# Patient Record
Sex: Male | Born: 1935 | Race: White | Hispanic: No | Marital: Married | Smoking: Never smoker
Health system: Southern US, Community
[De-identification: ages and names within clinical notes are randomized; demographics above are authoritative.]

## PROBLEM LIST (undated history)

## (undated) DIAGNOSIS — D509 Iron deficiency anemia, unspecified: Secondary | ICD-10-CM

## (undated) DIAGNOSIS — K279 Peptic ulcer, site unspecified, unspecified as acute or chronic, without hemorrhage or perforation: Secondary | ICD-10-CM

## (undated) DIAGNOSIS — E785 Hyperlipidemia, unspecified: Secondary | ICD-10-CM

## (undated) DIAGNOSIS — K5792 Diverticulitis of intestine, part unspecified, without perforation or abscess without bleeding: Secondary | ICD-10-CM

## (undated) DIAGNOSIS — I10 Essential (primary) hypertension: Secondary | ICD-10-CM

## (undated) DIAGNOSIS — Z8349 Family history of other endocrine, nutritional and metabolic diseases: Secondary | ICD-10-CM

## (undated) DIAGNOSIS — D4959 Neoplasm of unspecified behavior of other genitourinary organ: Secondary | ICD-10-CM

## (undated) DIAGNOSIS — Z860100 Personal history of colon polyps, unspecified: Secondary | ICD-10-CM

## (undated) DIAGNOSIS — E669 Obesity, unspecified: Secondary | ICD-10-CM

## (undated) DIAGNOSIS — K224 Dyskinesia of esophagus: Secondary | ICD-10-CM

## (undated) DIAGNOSIS — K449 Diaphragmatic hernia without obstruction or gangrene: Secondary | ICD-10-CM

## (undated) DIAGNOSIS — A048 Other specified bacterial intestinal infections: Secondary | ICD-10-CM

## (undated) DIAGNOSIS — I4891 Unspecified atrial fibrillation: Secondary | ICD-10-CM

## (undated) DIAGNOSIS — Z9289 Personal history of other medical treatment: Secondary | ICD-10-CM

## (undated) DIAGNOSIS — R002 Palpitations: Secondary | ICD-10-CM

## (undated) DIAGNOSIS — K2289 Other specified disease of esophagus: Secondary | ICD-10-CM

## (undated) DIAGNOSIS — Z79899 Other long term (current) drug therapy: Secondary | ICD-10-CM

## (undated) DIAGNOSIS — K222 Esophageal obstruction: Secondary | ICD-10-CM

## (undated) DIAGNOSIS — Z800 Family history of malignant neoplasm of digestive organs: Secondary | ICD-10-CM

## (undated) HISTORY — DX: Diverticulitis of intestine, part unspecified, without perforation or abscess without bleeding: K57.92

## (undated) HISTORY — DX: Unspecified atrial fibrillation: I48.91

## (undated) HISTORY — DX: Other specified disease of esophagus: K22.89

## (undated) HISTORY — DX: Esophageal obstruction: K22.2

## (undated) HISTORY — DX: Family history of other endocrine, nutritional and metabolic diseases: Z83.49

## (undated) HISTORY — DX: Other long term (current) drug therapy: Z79.899

## (undated) HISTORY — DX: Palpitations: R00.2

## (undated) HISTORY — DX: Neoplasm of unspecified behavior of other genitourinary organ: D49.59

## (undated) HISTORY — DX: Iron deficiency anemia, unspecified: D50.9

## (undated) HISTORY — PX: OTHER SURGICAL HISTORY: SHX169

## (undated) HISTORY — DX: Obesity, unspecified: E66.9

## (undated) HISTORY — DX: Personal history of colon polyps, unspecified: Z86.0100

## (undated) HISTORY — DX: Essential (primary) hypertension: I10

## (undated) HISTORY — DX: Personal history of other medical treatment: Z92.89

## (undated) HISTORY — DX: Peptic ulcer, site unspecified, unspecified as acute or chronic, without hemorrhage or perforation: K27.9

## (undated) HISTORY — DX: Dyskinesia of esophagus: K22.4

## (undated) HISTORY — DX: Other specified bacterial intestinal infections: A04.8

## (undated) HISTORY — DX: Hyperlipidemia, unspecified: E78.5

## (undated) HISTORY — PX: KNEE ARTHROSCOPY: SUR90

## (undated) HISTORY — DX: Personal history of colonic polyps: Z86.010

## (undated) HISTORY — PX: TONSILLECTOMY: SUR1361

## (undated) HISTORY — DX: Diaphragmatic hernia without obstruction or gangrene: K44.9

## (undated) HISTORY — DX: Family history of malignant neoplasm of digestive organs: Z80.0

---

## 1989-10-02 HISTORY — PX: OTHER SURGICAL HISTORY: SHX169

## 1994-10-02 HISTORY — PX: CARDIAC VALVE SURGERY: SHX40

## 1998-03-10 ENCOUNTER — Ambulatory Visit (HOSPITAL_BASED_OUTPATIENT_CLINIC_OR_DEPARTMENT_OTHER): Admission: RE | Admit: 1998-03-10 | Discharge: 1998-03-10 | Payer: Self-pay | Admitting: *Deleted

## 2000-05-10 ENCOUNTER — Encounter: Payer: Self-pay | Admitting: Internal Medicine

## 2000-05-10 ENCOUNTER — Ambulatory Visit (HOSPITAL_COMMUNITY): Admission: RE | Admit: 2000-05-10 | Discharge: 2000-05-10 | Payer: Self-pay | Admitting: *Deleted

## 2000-05-30 ENCOUNTER — Ambulatory Visit (HOSPITAL_COMMUNITY): Admission: RE | Admit: 2000-05-30 | Discharge: 2000-05-30 | Payer: Self-pay | Admitting: Urology

## 2000-05-30 ENCOUNTER — Encounter: Payer: Self-pay | Admitting: Urology

## 2002-10-02 HISTORY — PX: CATARACT EXTRACTION: SUR2

## 2004-08-22 ENCOUNTER — Ambulatory Visit: Payer: Self-pay | Admitting: Gastroenterology

## 2004-09-01 HISTORY — PX: COLONOSCOPY: SHX174

## 2004-09-05 ENCOUNTER — Ambulatory Visit: Payer: Self-pay | Admitting: Gastroenterology

## 2005-02-08 ENCOUNTER — Ambulatory Visit: Payer: Self-pay | Admitting: Internal Medicine

## 2005-06-06 ENCOUNTER — Ambulatory Visit: Payer: Self-pay | Admitting: Internal Medicine

## 2005-06-22 ENCOUNTER — Ambulatory Visit: Payer: Self-pay | Admitting: Internal Medicine

## 2005-10-02 LAB — HM COLONOSCOPY

## 2006-08-16 ENCOUNTER — Ambulatory Visit: Payer: Self-pay | Admitting: Internal Medicine

## 2006-09-06 ENCOUNTER — Ambulatory Visit: Payer: Self-pay | Admitting: Internal Medicine

## 2006-09-06 LAB — CONVERTED CEMR LAB
ALT: 21 units/L (ref 0–40)
AST: 21 units/L (ref 0–37)
Albumin: 4.1 g/dL (ref 3.5–5.2)
Alkaline Phosphatase: 80 units/L (ref 39–117)
BUN: 16 mg/dL (ref 6–23)
Bilirubin, Direct: 0.2 mg/dL (ref 0.0–0.3)
CO2: 28 meq/L (ref 19–32)
Calcium: 9.2 mg/dL (ref 8.4–10.5)
Chloride: 102 meq/L (ref 96–112)
Chol/HDL Ratio, serum: 5.3
Cholesterol: 191 mg/dL (ref 0–200)
Creatinine, Ser: 1.1 mg/dL (ref 0.4–1.5)
GFR calc non Af Amer: 69 mL/min
Glomerular Filtration Rate, Af Am: 84 mL/min/{1.73_m2}
Glucose, Bld: 87 mg/dL (ref 70–99)
HCT: 49 % (ref 39.0–52.0)
HDL: 35.9 mg/dL — ABNORMAL LOW (ref >39.0)
Hemoglobin: 16.5 g/dL (ref 13.0–17.0)
LDL Cholesterol: 125 mg/dL — ABNORMAL HIGH (ref 0–99)
MCHC: 33.8 g/dL (ref 30.0–36.0)
MCV: 93.4 fL (ref 78.0–100.0)
PSA: 1.22 ng/mL (ref 0.10–4.00)
Platelets: 211 10*3/uL (ref 150–400)
Potassium: 3.9 meq/L (ref 3.5–5.1)
RBC: 5.25 M/uL (ref 4.22–5.81)
RDW: 12.5 % (ref 11.5–14.6)
Sodium: 137 meq/L (ref 135–145)
Total Bilirubin: 1 mg/dL (ref 0.3–1.2)
Total Protein: 6.8 g/dL (ref 6.0–8.3)
Triglyceride fasting, serum: 150 mg/dL — ABNORMAL HIGH (ref 0–149)
VLDL: 30 mg/dL (ref 0–40)
WBC: 7 10*3/uL (ref 4.5–10.5)

## 2007-07-31 DIAGNOSIS — Z86010 Personal history of colonic polyps: Secondary | ICD-10-CM | POA: Insufficient documentation

## 2007-07-31 DIAGNOSIS — Z8719 Personal history of other diseases of the digestive system: Secondary | ICD-10-CM | POA: Insufficient documentation

## 2007-07-31 DIAGNOSIS — E785 Hyperlipidemia, unspecified: Secondary | ICD-10-CM | POA: Insufficient documentation

## 2008-03-13 ENCOUNTER — Ambulatory Visit: Payer: Self-pay | Admitting: Internal Medicine

## 2008-03-13 DIAGNOSIS — I482 Chronic atrial fibrillation, unspecified: Secondary | ICD-10-CM | POA: Insufficient documentation

## 2008-03-13 DIAGNOSIS — I4891 Unspecified atrial fibrillation: Secondary | ICD-10-CM | POA: Insufficient documentation

## 2008-03-13 HISTORY — DX: Chronic atrial fibrillation, unspecified: I48.20

## 2008-03-16 ENCOUNTER — Ambulatory Visit: Payer: Self-pay | Admitting: Internal Medicine

## 2008-03-20 ENCOUNTER — Ambulatory Visit: Payer: Self-pay | Admitting: Internal Medicine

## 2008-03-20 LAB — CONVERTED CEMR LAB
INR: 2.2
Prothrombin Time: 18 s

## 2008-04-02 ENCOUNTER — Ambulatory Visit: Payer: Self-pay | Admitting: Internal Medicine

## 2008-04-02 LAB — CONVERTED CEMR LAB
INR: 4.3
Prothrombin Time: 25 s

## 2008-04-16 ENCOUNTER — Ambulatory Visit: Payer: Self-pay | Admitting: Internal Medicine

## 2008-04-16 LAB — CONVERTED CEMR LAB
INR: 2.3
Prothrombin Time: 18.4 s

## 2008-05-04 ENCOUNTER — Ambulatory Visit: Payer: Self-pay | Admitting: Internal Medicine

## 2008-05-08 ENCOUNTER — Ambulatory Visit: Payer: Self-pay | Admitting: Cardiology

## 2008-05-18 ENCOUNTER — Ambulatory Visit: Payer: Self-pay | Admitting: Internal Medicine

## 2008-05-18 LAB — CONVERTED CEMR LAB
INR: 2.3
Prothrombin Time: 18.5 s

## 2008-05-26 ENCOUNTER — Ambulatory Visit: Payer: Self-pay | Admitting: Cardiology

## 2008-06-15 ENCOUNTER — Ambulatory Visit: Payer: Self-pay | Admitting: Internal Medicine

## 2008-06-15 LAB — CONVERTED CEMR LAB
INR: 2.1
Prothrombin Time: 17.9 s

## 2008-07-20 ENCOUNTER — Ambulatory Visit: Payer: Self-pay | Admitting: Internal Medicine

## 2008-07-20 LAB — CONVERTED CEMR LAB
INR: 2.9
Prothrombin Time: 20.5 s

## 2008-08-05 ENCOUNTER — Ambulatory Visit: Payer: Self-pay | Admitting: Internal Medicine

## 2008-08-20 ENCOUNTER — Ambulatory Visit: Payer: Self-pay

## 2008-08-20 ENCOUNTER — Encounter: Payer: Self-pay | Admitting: Cardiology

## 2008-08-20 ENCOUNTER — Ambulatory Visit: Payer: Self-pay | Admitting: Cardiology

## 2008-08-21 ENCOUNTER — Ambulatory Visit: Payer: Self-pay | Admitting: Internal Medicine

## 2008-08-24 LAB — CONVERTED CEMR LAB
INR: 2.8
Prothrombin Time: 20.2 s

## 2008-09-18 ENCOUNTER — Ambulatory Visit: Payer: Self-pay | Admitting: Internal Medicine

## 2008-09-18 LAB — CONVERTED CEMR LAB
INR: 2.2
Prothrombin Time: 18.1 s

## 2008-10-19 ENCOUNTER — Ambulatory Visit: Payer: Self-pay | Admitting: Internal Medicine

## 2008-10-19 LAB — CONVERTED CEMR LAB
INR: 1.7
Prothrombin Time: 16.3 s

## 2008-11-16 ENCOUNTER — Ambulatory Visit: Payer: Self-pay | Admitting: Internal Medicine

## 2008-11-16 LAB — CONVERTED CEMR LAB
INR: 2.7
Prothrombin Time: 19.9 s

## 2008-12-03 ENCOUNTER — Ambulatory Visit: Payer: Self-pay | Admitting: Internal Medicine

## 2008-12-16 ENCOUNTER — Ambulatory Visit: Payer: Self-pay | Admitting: Internal Medicine

## 2008-12-16 LAB — CONVERTED CEMR LAB
INR: 2.1
Prothrombin Time: 18 s

## 2009-01-15 ENCOUNTER — Ambulatory Visit: Payer: Self-pay | Admitting: Internal Medicine

## 2009-01-15 LAB — CONVERTED CEMR LAB
INR: 2.2
Prothrombin Time: 18.3 s

## 2009-01-20 DIAGNOSIS — E669 Obesity, unspecified: Secondary | ICD-10-CM | POA: Insufficient documentation

## 2009-01-20 DIAGNOSIS — R002 Palpitations: Secondary | ICD-10-CM | POA: Insufficient documentation

## 2009-01-20 DIAGNOSIS — I10 Essential (primary) hypertension: Secondary | ICD-10-CM | POA: Insufficient documentation

## 2009-01-22 ENCOUNTER — Encounter: Payer: Self-pay | Admitting: Cardiology

## 2009-01-22 ENCOUNTER — Ambulatory Visit: Payer: Self-pay | Admitting: Cardiology

## 2009-02-15 ENCOUNTER — Ambulatory Visit: Payer: Self-pay | Admitting: Internal Medicine

## 2009-02-15 LAB — CONVERTED CEMR LAB
INR: 2.1
Prothrombin Time: 18 s

## 2009-03-17 ENCOUNTER — Ambulatory Visit: Payer: Self-pay | Admitting: Internal Medicine

## 2009-03-17 LAB — CONVERTED CEMR LAB
ALT: 16 units/L (ref 0–53)
AST: 18 units/L (ref 0–37)
Albumin: 4.2 g/dL (ref 3.5–5.2)
Alkaline Phosphatase: 87 units/L (ref 39–117)
BUN: 19 mg/dL (ref 6–23)
Basophils Absolute: 0 10*3/uL (ref 0.0–0.1)
Basophils Relative: 0.2 % (ref 0.0–3.0)
Bilirubin, Direct: 0.1 mg/dL (ref 0.0–0.3)
CO2: 27 meq/L (ref 19–32)
Calcium: 8.8 mg/dL (ref 8.4–10.5)
Chloride: 102 meq/L (ref 96–112)
Cholesterol: 169 mg/dL (ref 0–200)
Creatinine, Ser: 1.1 mg/dL (ref 0.4–1.5)
Eosinophils Absolute: 0.1 10*3/uL (ref 0.0–0.7)
Eosinophils Relative: 0.8 % (ref 0.0–5.0)
GFR calc non Af Amer: 68.62 mL/min (ref >60)
Glucose, Bld: 99 mg/dL (ref 70–99)
HCT: 50.6 % (ref 39.0–52.0)
HDL: 35.8 mg/dL — ABNORMAL LOW (ref >39.00)
Hemoglobin: 17.2 g/dL — ABNORMAL HIGH (ref 13.0–17.0)
INR: 2.6
LDL Cholesterol: 113 mg/dL — ABNORMAL HIGH (ref 0–99)
Lymphocytes Relative: 26 % (ref 12.0–46.0)
Lymphs Abs: 1.6 10*3/uL (ref 0.7–4.0)
MCHC: 34 g/dL (ref 30.0–36.0)
MCV: 94.7 fL (ref 78.0–100.0)
Monocytes Absolute: 0.4 10*3/uL (ref 0.1–1.0)
Monocytes Relative: 6.8 % (ref 3.0–12.0)
Neutro Abs: 4.2 10*3/uL (ref 1.4–7.7)
Neutrophils Relative %: 66.2 % (ref 43.0–77.0)
Platelets: 164 10*3/uL (ref 150.0–400.0)
Potassium: 4.4 meq/L (ref 3.5–5.1)
Prothrombin Time: 19.7 s
RBC: 5.34 M/uL (ref 4.22–5.81)
RDW: 12.3 % (ref 11.5–14.6)
Sodium: 141 meq/L (ref 135–145)
TSH: 2.06 microintl units/mL (ref 0.35–5.50)
Total Bilirubin: 1.1 mg/dL (ref 0.3–1.2)
Total CHOL/HDL Ratio: 5
Total Protein: 7.2 g/dL (ref 6.0–8.3)
Triglycerides: 101 mg/dL (ref 0.0–149.0)
VLDL: 20.2 mg/dL (ref 0.0–40.0)
WBC: 6.3 10*3/uL (ref 4.5–10.5)

## 2009-04-16 ENCOUNTER — Ambulatory Visit: Payer: Self-pay | Admitting: Internal Medicine

## 2009-04-16 LAB — CONVERTED CEMR LAB
INR: 2.4
Prothrombin Time: 18.8 s

## 2009-05-14 ENCOUNTER — Ambulatory Visit: Payer: Self-pay | Admitting: Internal Medicine

## 2009-05-14 LAB — CONVERTED CEMR LAB
INR: 2.6
Prothrombin Time: 19.5 s

## 2009-06-15 ENCOUNTER — Ambulatory Visit: Payer: Self-pay | Admitting: Internal Medicine

## 2009-06-15 LAB — CONVERTED CEMR LAB
INR: 1.6
Prothrombin Time: 15.8 s

## 2009-07-16 ENCOUNTER — Ambulatory Visit: Payer: Self-pay | Admitting: Internal Medicine

## 2009-07-16 LAB — CONVERTED CEMR LAB
INR: 2
Prothrombin Time: 17.3 s

## 2009-08-02 ENCOUNTER — Ambulatory Visit: Payer: Self-pay | Admitting: Cardiology

## 2009-08-11 ENCOUNTER — Ambulatory Visit: Payer: Self-pay | Admitting: Internal Medicine

## 2009-08-16 ENCOUNTER — Ambulatory Visit: Payer: Self-pay | Admitting: Internal Medicine

## 2009-08-16 LAB — CONVERTED CEMR LAB
INR: 3.4
Prothrombin Time: 22.3 s

## 2009-09-13 ENCOUNTER — Ambulatory Visit: Payer: Self-pay | Admitting: Internal Medicine

## 2009-09-13 LAB — CONVERTED CEMR LAB
INR: 2.5
Prothrombin Time: 19.2 s

## 2009-10-14 ENCOUNTER — Ambulatory Visit: Payer: Self-pay | Admitting: Internal Medicine

## 2009-10-14 LAB — CONVERTED CEMR LAB
INR: 2.3
Prothrombin Time: 18.4 s

## 2009-11-12 ENCOUNTER — Ambulatory Visit: Payer: Self-pay | Admitting: Internal Medicine

## 2009-11-12 LAB — CONVERTED CEMR LAB
INR: 1.7
Prothrombin Time: 15.9 s

## 2009-12-10 ENCOUNTER — Ambulatory Visit: Payer: Self-pay | Admitting: Internal Medicine

## 2009-12-10 LAB — CONVERTED CEMR LAB
INR: 2.3
Prothrombin Time: 18.4 s

## 2010-01-07 ENCOUNTER — Ambulatory Visit: Payer: Self-pay | Admitting: Internal Medicine

## 2010-01-07 LAB — CONVERTED CEMR LAB
INR: 2.5
Prothrombin Time: 19.2 s

## 2010-02-01 ENCOUNTER — Ambulatory Visit: Payer: Self-pay | Admitting: Cardiology

## 2010-02-07 ENCOUNTER — Ambulatory Visit: Payer: Self-pay | Admitting: Internal Medicine

## 2010-02-07 LAB — CONVERTED CEMR LAB
INR: 2.5
Prothrombin Time: 19.3 s

## 2010-02-24 ENCOUNTER — Ambulatory Visit: Payer: Self-pay

## 2010-02-24 ENCOUNTER — Encounter: Payer: Self-pay | Admitting: Cardiology

## 2010-02-24 ENCOUNTER — Ambulatory Visit: Payer: Self-pay | Admitting: Internal Medicine

## 2010-02-24 ENCOUNTER — Ambulatory Visit (HOSPITAL_COMMUNITY): Admission: RE | Admit: 2010-02-24 | Discharge: 2010-02-24 | Payer: Self-pay | Admitting: Internal Medicine

## 2010-02-25 ENCOUNTER — Telehealth: Payer: Self-pay | Admitting: Cardiology

## 2010-03-10 ENCOUNTER — Ambulatory Visit: Payer: Self-pay | Admitting: Internal Medicine

## 2010-03-10 LAB — CONVERTED CEMR LAB: INR: 2.4

## 2010-04-11 ENCOUNTER — Ambulatory Visit: Payer: Self-pay | Admitting: Internal Medicine

## 2010-04-11 LAB — CONVERTED CEMR LAB
ALT: 17 units/L (ref 0–53)
AST: 23 units/L (ref 0–37)
Albumin: 4 g/dL (ref 3.5–5.2)
Alkaline Phosphatase: 93 units/L (ref 39–117)
BUN: 19 mg/dL (ref 6–23)
Basophils Absolute: 0 10*3/uL (ref 0.0–0.1)
Basophils Relative: 0.5 % (ref 0.0–3.0)
Bilirubin, Direct: 0.2 mg/dL (ref 0.0–0.3)
CO2: 29 meq/L (ref 19–32)
Calcium: 8.8 mg/dL (ref 8.4–10.5)
Chloride: 104 meq/L (ref 96–112)
Cholesterol: 181 mg/dL (ref 0–200)
Creatinine, Ser: 1 mg/dL (ref 0.4–1.5)
Eosinophils Absolute: 0.1 10*3/uL (ref 0.0–0.7)
Eosinophils Relative: 0.9 % (ref 0.0–5.0)
GFR calc non Af Amer: 75.52 mL/min (ref >60)
Glucose, Bld: 87 mg/dL (ref 70–99)
HCT: 49.6 % (ref 39.0–52.0)
HDL: 38.2 mg/dL — ABNORMAL LOW (ref >39.00)
Hemoglobin: 17.1 g/dL — ABNORMAL HIGH (ref 13.0–17.0)
LDL Cholesterol: 113 mg/dL — ABNORMAL HIGH (ref 0–99)
Lymphocytes Relative: 24.3 % (ref 12.0–46.0)
Lymphs Abs: 1.9 10*3/uL (ref 0.7–4.0)
MCHC: 34.5 g/dL (ref 30.0–36.0)
MCV: 95.3 fL (ref 78.0–100.0)
Monocytes Absolute: 0.4 10*3/uL (ref 0.1–1.0)
Monocytes Relative: 5.2 % (ref 3.0–12.0)
Neutro Abs: 5.5 10*3/uL (ref 1.4–7.7)
Neutrophils Relative %: 69.1 % (ref 43.0–77.0)
Platelets: 193 10*3/uL (ref 150.0–400.0)
Potassium: 4.6 meq/L (ref 3.5–5.1)
RBC: 5.21 M/uL (ref 4.22–5.81)
RDW: 13.4 % (ref 11.5–14.6)
Sodium: 139 meq/L (ref 135–145)
TSH: 2.45 microintl units/mL (ref 0.35–5.50)
Total Bilirubin: 1 mg/dL (ref 0.3–1.2)
Total CHOL/HDL Ratio: 5
Total Protein: 6.5 g/dL (ref 6.0–8.3)
Triglycerides: 149 mg/dL (ref 0.0–149.0)
VLDL: 29.8 mg/dL (ref 0.0–40.0)
WBC: 8 10*3/uL (ref 4.5–10.5)

## 2010-05-05 ENCOUNTER — Ambulatory Visit: Payer: Self-pay | Admitting: Internal Medicine

## 2010-05-05 LAB — CONVERTED CEMR LAB: INR: 2.4

## 2010-05-11 ENCOUNTER — Telehealth: Payer: Self-pay | Admitting: Cardiology

## 2010-06-01 ENCOUNTER — Ambulatory Visit: Payer: Self-pay | Admitting: Internal Medicine

## 2010-06-01 LAB — CONVERTED CEMR LAB: INR: 2.3

## 2010-06-30 ENCOUNTER — Ambulatory Visit: Payer: Self-pay | Admitting: Internal Medicine

## 2010-06-30 LAB — CONVERTED CEMR LAB: INR: 1.9

## 2010-08-04 ENCOUNTER — Ambulatory Visit: Payer: Self-pay | Admitting: Internal Medicine

## 2010-08-04 LAB — CONVERTED CEMR LAB: INR: 1.8

## 2010-09-06 ENCOUNTER — Ambulatory Visit: Payer: Self-pay | Admitting: Internal Medicine

## 2010-09-06 LAB — CONVERTED CEMR LAB: INR: 2.4

## 2010-10-10 ENCOUNTER — Ambulatory Visit
Admission: RE | Admit: 2010-10-10 | Discharge: 2010-10-10 | Payer: Self-pay | Source: Home / Self Care | Attending: Internal Medicine | Admitting: Internal Medicine

## 2010-10-15 ENCOUNTER — Encounter: Payer: Self-pay | Admitting: Internal Medicine

## 2010-10-15 DIAGNOSIS — I4891 Unspecified atrial fibrillation: Secondary | ICD-10-CM

## 2010-10-30 LAB — CONVERTED CEMR LAB
ALT: 21 units/L (ref 0–53)
AST: 22 units/L (ref 0–37)
Albumin: 4.1 g/dL (ref 3.5–5.2)
Alkaline Phosphatase: 71 units/L (ref 39–117)
BUN: 21 mg/dL (ref 6–23)
Basophils Absolute: 0 10*3/uL (ref 0.0–0.1)
Basophils Relative: 0.2 % (ref 0.0–1.0)
Bilirubin, Direct: 0.1 mg/dL (ref 0.0–0.3)
CO2: 25 meq/L (ref 19–32)
Calcium: 9.1 mg/dL (ref 8.4–10.5)
Chloride: 106 meq/L (ref 96–112)
Cholesterol: 170 mg/dL (ref 0–200)
Creatinine, Ser: 1.1 mg/dL (ref 0.4–1.5)
Eosinophils Absolute: 0.1 10*3/uL (ref 0.0–0.7)
Eosinophils Relative: 1.3 % (ref 0.0–5.0)
GFR calc Af Amer: 83 mL/min
GFR calc non Af Amer: 69 mL/min
Glucose, Bld: 105 mg/dL — ABNORMAL HIGH (ref 70–99)
HCT: 47.9 % (ref 39.0–52.0)
HDL: 31 mg/dL — ABNORMAL LOW (ref >39.0)
Hemoglobin: 16.7 g/dL (ref 13.0–17.0)
LDL Cholesterol: 113 mg/dL — ABNORMAL HIGH (ref 0–99)
Lymphocytes Relative: 28.1 % (ref 12.0–46.0)
MCHC: 34.9 g/dL (ref 30.0–36.0)
MCV: 94.3 fL (ref 78.0–100.0)
Monocytes Absolute: 0.6 10*3/uL (ref 0.1–1.0)
Monocytes Relative: 8.1 % (ref 3.0–12.0)
Neutro Abs: 4.5 10*3/uL (ref 1.4–7.7)
Neutrophils Relative %: 62.3 % (ref 43.0–77.0)
Platelets: 196 10*3/uL (ref 150–400)
Potassium: 4.3 meq/L (ref 3.5–5.1)
RBC: 5.08 M/uL (ref 4.22–5.81)
RDW: 12.7 % (ref 11.5–14.6)
Sodium: 140 meq/L (ref 135–145)
TSH: 2.3 microintl units/mL (ref 0.35–5.50)
Total Bilirubin: 1.1 mg/dL (ref 0.3–1.2)
Total CHOL/HDL Ratio: 5.5
Total Protein: 6.9 g/dL (ref 6.0–8.3)
Triglycerides: 128 mg/dL (ref 0–149)
VLDL: 26 mg/dL (ref 0–40)
WBC: 7.3 10*3/uL (ref 4.5–10.5)

## 2010-11-01 NOTE — Assessment & Plan Note (Signed)
Summary: pt/njr  Nurse Visit   Allergies: 1)  ! Codeine Laboratory Results   Blood Tests   Date/Time Received: June 01, 2010 9:48 AM  Date/Time Reported: June 01, 2010 9:48 AM    INR: 2.3   (Normal Range: 0.88-1.12   Therap INR: 2.0-3.5) Comments: Wynona Canes, CMA  June 01, 2010 9:48 AM     Orders Added: 1)  Est. Patient Level I [99211] 2)  Protime [[id number]]  Laboratory Results   Blood Tests      INR: 2.3   (Normal Range: 0.88-1.12   Therap INR: 2.0-3.5) Comments: Wynona Canes, CMA  June 01, 2010 9:48 AM       ANTICOAGULATION RECORD PREVIOUS REGIMEN & LAB RESULTS Anticoagulation Diagnosis:  v58.83,v58.61,427.31 on  04/16/2008 Previous INR Goal Range:  2.0-3.0 on  04/16/2008 Previous INR:  2.4 on  05/05/2010 Previous Coumadin Dose(mg):  5mg  on m,w,f 7.5mg  other  on  08/24/2008 Previous Regimen:  same on  05/05/2010 Previous Coagulation Comments:  Patient stated he couldn't come for a month. I gave him a card saying three weeks and also verbaly told him Dr. Kirtland Bouchard said 3 weeks. on  06/15/2009  NEW REGIMEN & LAB RESULTS Current INR: 2.3 Regimen: same  (no change)       Repeat testing in: 4 weeks MEDICATIONS WARFARIN SODIUM 5 MG  TABS (WARFARIN SODIUM) one  tablet Monday, Wednesday, Friday and one, and one half tablets 4 times weekly DILTIAZEM HCL ER BEADS 360 MG XR24H-CAP (DILTIAZEM HCL ER BEADS) Take one capsule by mouth daily VIAGRA 50 MG TABS (SILDENAFIL CITRATE) one daily as directed   Anticoagulation Visit Questionnaire      Coumadin dose missed/changed:  No      Abnormal Bleeding Symptoms:  No   Any diet changes including alcohol intake, vegetables or greens since the last visit:  No Any illnesses or hospitalizations since the last visit:  No Any signs of clotting since the last visit (including chest discomfort, dizziness, shortness of breath, arm tingling, slurred speech, swelling or redness in leg):  No

## 2010-11-01 NOTE — Assessment & Plan Note (Signed)
Summary: 3 day rov/njr   Vital Signs:  Patient Profile:   77 Years Old Male Weight:      256 pounds Pulse rate:   100 / minute Pulse rhythm:   irregular BP sitting:   140 / 60  (left arm) Cuff size:   regular  Vitals Entered By: Raechel Ache, RN (March 16, 2008 1:50 PM)                 Chief Complaint:  F/u; c/o dull headache for few days.Marland Kitchen  History of Present Illness: 77 year old patient seen today for follow-up.  He was seen 3 days ago for an annual exam and was noted to have atrial fibrillation  of new onset.  He is placed on oral anticoagulation, as well as diltiazem and is seen today for follow-up.  He feels quite well, but is much more tuned into his irregular heart rate, but denies any shortness of breath, chest pain or symptoms of congestive heart failure.  A2-D echocardiogram was done 3 days ago.  The results are pending.    Current Allergies: ! CODEINE  Past Medical History:    Reviewed history from 03/13/2008 and no changes required:       Obesity       Colonic polyps, hx of       Diverticulitis, hx of       Hyperlipidemia       Anticoagulation therapy June 2009       Atrial fibrillation June 2009      Physical Exam  General:     Well-developed,well-nourished,in no acute distress; alert,appropriate and cooperative throughout examination   130/70 Head:     Normocephalic and atraumatic without obvious abnormalities. No apparent alopecia or balding. Neck:     No deformities, masses, or tenderness noted. Lungs:     Normal respiratory effort, chest expands symmetrically. Lungs are clear to auscultation, no crackles or wheezes. Heart:     rhythm irregularly irregular with a controlled ventricular response; no murmur.      Impression & Recommendations:  Problem # 1:  ATRIAL FIBRILLATION (ICD-427.31)  His updated medication list for this problem includes:    Warfarin Sodium 5 Mg Tabs (Warfarin sodium) ..... One daily  Orders: Protime ([id number])    Problem # 2:  ANTICOAGULATION THERAPY (ICD-V58.61)  Complete Medication List: 1)  Warfarin Sodium 5 Mg Tabs (Warfarin sodium) .... One daily 2)  Diltiazem Hcl Coated Beads 240 Mg Cp24 (Diltiazem hcl coated beads) .... One daily   Patient Instructions: 1)  Please schedule a follow-up appointment in 1 month. 2)  Limit your Sodium (Salt) to less than 2 grams a day(slightly less than 1/2 a teaspoon) to prevent fluid retention, swelling, or worsening of symptoms. 3)  It is important that you exercise regularly at least 20 minutes 5 times a week. If you develop chest pain, have severe difficulty breathing, or feel very tired , stop exercising immediately and seek medical attention. 4)  You need to lose weight. Consider a lower calorie diet and regular exercise.    Prescriptions: DILTIAZEM HCL COATED BEADS 240 MG  CP24 (DILTIAZEM HCL COATED BEADS) one daily  #90 x 4   Entered and Authorized by:   Gordy Savers  MD   Signed by:   Gordy Savers  MD on 03/16/2008   Method used:   Print then Give to Patient   RxID:   [id number] WARFARIN SODIUM 5 MG  TABS (WARFARIN SODIUM) one daily  #30  x 4   Entered and Authorized by:   Gordy Savers  MD   Signed by:   Gordy Savers  MD on 03/16/2008   Method used:   Print then Give to Patient   RxID:   [id number]  ]  Appended Document: 3 day rov/njr   ANTICOAGULATION RECORD       NEW REGIMEN & LAB RESULTS Current INR: 1.1 Current Coumadin Dose(mg): 5 MG QD Regimen: 7.5 QD  Provider: [id number]      Repeat testing in: 03/20/08 MEDICATIONS WARFARIN SODIUM 5 MG  TABS (WARFARIN SODIUM) one daily DILTIAZEM HCL COATED BEADS 240 MG  CP24 (DILTIAZEM HCL COATED BEADS) one daily   Anticoagulation Visit Questionnaire      Coumadin dose missed/changed:  No      Abnormal Bleeding Symptoms:  No   Any diet changes including alcohol intake, vegetables or greens since the last visit:  No Any illnesses or hospitalizations  since the last visit:  No Any signs of clotting since the last visit (including chest discomfort, dizziness, shortness of breath, arm tingling, slurred speech, swelling or redness in leg):  No   Laboratory Results   Blood Tests     PT: 13.1 s   (Normal Range: 10.6-13.4)  INR: 1.1   (Normal Range: 0.88-1.12   Therap INR: 2.0-3.5) Comments: Milica Zimonjic  March 16, 2008 2:46 PM

## 2010-11-01 NOTE — Assessment & Plan Note (Signed)
Summary: pt//ccm  Nurse Visit   Allergies: 1)  ! Codeine Laboratory Results   Blood Tests      INR: 2.4   (Normal Range: 0.88-1.12   Therap INR: 2.0-3.5) Comments: Rita Ohara  May 05, 2010 9:52 AM     Orders Added: 1)  Est. Patient Level I [99211] 2)  Protime [[id number]]   ANTICOAGULATION RECORD PREVIOUS REGIMEN & LAB RESULTS Anticoagulation Diagnosis:  v58.83,v58.61,427.31 on  04/16/2008 Previous INR Goal Range:  2.0-3.0 on  04/16/2008 Previous INR:  1.9 on  04/11/2010 Previous Coumadin Dose(mg):  5mg  on m,w,f 7.5mg  other  on  08/24/2008 Previous Regimen:  same on  04/11/2010 Previous Coagulation Comments:  Patient stated he couldn't come for a month. I gave him a card saying three weeks and also verbaly told him Dr. Kirtland Bouchard said 3 weeks. on  06/15/2009  NEW REGIMEN & LAB RESULTS Current INR: 2.4 Regimen: same  Repeat testing in: 4 weeks  Anticoagulation Visit Questionnaire Coumadin dose missed/changed:  No Abnormal Bleeding Symptoms:  No  Any diet changes including alcohol intake, vegetables or greens since the last visit:  No Any illnesses or hospitalizations since the last visit:  No Any signs of clotting since the last visit (including chest discomfort, dizziness, shortness of breath, arm tingling, slurred speech, swelling or redness in leg):  No  MEDICATIONS WARFARIN SODIUM 5 MG  TABS (WARFARIN SODIUM) one  tablet Monday, Wednesday, Friday and one, and one half tablets 4 times weekly DILTIAZEM HCL ER BEADS 360 MG XR24H-CAP (DILTIAZEM HCL ER BEADS) Take one capsule by mouth daily VIAGRA 50 MG TABS (SILDENAFIL CITRATE) one daily as directed

## 2010-11-01 NOTE — Assessment & Plan Note (Signed)
Summary: pt/njr  Nurse Visit   Vital Signs:  Patient profile:   77 year old male Pulse rate:   64 / minute BP sitting:   120 / 74  Allergies: 1)  ! Codeine Laboratory Results   Blood Tests     PT: 15.8 s   (Normal Range: 10.6-13.4)  INR: 1.6   (Normal Range: 0.88-1.12   Therap INR: 2.0-3.5) Comments: Rita Ohara  June 15, 2009 9:55 AM     Orders Added: 1)  Est. Patient Level I [99211] 2)  Fingerstick [36416] 3)  Protime [[id number]]   ANTICOAGULATION RECORD PREVIOUS REGIMEN & LAB RESULTS Anticoagulation Diagnosis:  v58.83,v58.61,427.31 on  04/16/2008 Previous INR Goal Range:  2.0-3.0 on  04/16/2008 Previous INR:  2.6 on  05/14/2009 Previous Coumadin Dose(mg):  5mg  on m,w,f 7.5mg  other  on  08/24/2008 Previous Regimen:  same on  07/20/2008 Previous Coagulation Comments:  OV on  05/04/2008  NEW REGIMEN & LAB RESULTS Current INR: 1.6 Regimen: same Coagulation Comments: Patient stated he couldn't come for a month. I gave him a card saying three weeks and also verbaly told him Dr. Kirtland Bouchard said 3 weeks. Repeat testing in: 3 weeks  Anticoagulation Visit Questionnaire Coumadin dose missed/changed:  No Abnormal Bleeding Symptoms:  No  Any diet changes including alcohol intake, vegetables or greens since the last visit:  No Any illnesses or hospitalizations since the last visit:  No Any signs of clotting since the last visit (including chest discomfort, dizziness, shortness of breath, arm tingling, slurred speech, swelling or redness in leg):  No  MEDICATIONS WARFARIN SODIUM 5 MG  TABS (WARFARIN SODIUM) one  tablet Monday, Wednesday, Friday and one, and one half tablets 4 times weekly DILTIAZEM HCL ER BEADS 360 MG XR24H-CAP (DILTIAZEM HCL ER BEADS) Take one capsule by mouth daily VIAGRA 50 MG TABS (SILDENAFIL CITRATE) one daily as directed     Vital Signs:  Patient Profile:   77 year old male Height:     73 inches Pulse rate:   64 / minute BP sitting:   120 /  74  (left arm)

## 2010-11-01 NOTE — Progress Notes (Signed)
Summary: Echo results  Phone Note Call from Patient Call back at Home Phone [phone]   Caller: Patient Summary of Call: Echo results Initial call taken by: Judie Grieve,  Feb 25, 2010 1:15 PM  Follow-up for Phone Call        Phone Call Completed

## 2010-11-01 NOTE — Assessment & Plan Note (Signed)
Summary: pt/njr  Nurse Visit     Allergies: 1)  ! Codeine  Laboratory Results   Blood Tests     PT: 18.0 s   (Normal Range: 10.6-13.4)  INR: 2.1   (Normal Range: 0.88-1.12   Therap INR: 2.0-3.5) Comments: Rita Ohara  Feb 15, 2009 9:59 AM       Orders Added: 1)  Est. Patient Level I [99211] 2)  Fingerstick [36416] 3)  Protime [[id number]]      ANTICOAGULATION RECORD PREVIOUS REGIMEN & LAB RESULTS Anticoagulation Diagnosis:  v58.83,v58.61,427.31 on  04/16/2008 Previous INR Goal Range:  2.0-3.0 on  04/16/2008 Previous INR:  2.2 on  01/15/2009 Previous Coumadin Dose(mg):  5mg  on m,w,f 7.5mg  other  on  08/24/2008 Previous Regimen:  same on  07/20/2008 Previous Coagulation Comments:  OV on  05/04/2008  NEW REGIMEN & LAB RESULTS Current INR: 2.1 Regimen: same  (no change)  Repeat testing in: 1 month  Anticoagulation Visit Questionnaire Coumadin dose missed/changed:  No Abnormal Bleeding Symptoms:  No  Any diet changes including alcohol intake, vegetables or greens since the last visit:  No Any illnesses or hospitalizations since the last visit:  No Any signs of clotting since the last visit (including chest discomfort, dizziness, shortness of breath, arm tingling, slurred speech, swelling or redness in leg):  No  MEDICATIONS WARFARIN SODIUM 5 MG  TABS (WARFARIN SODIUM) one  tablet Monday, Wednesday, Friday and one, and one half tablets 4 times weekly DILTIAZEM HCL ER BEADS 360 MG XR24H-CAP (DILTIAZEM HCL ER BEADS) Take one capsule by mouth daily

## 2010-11-01 NOTE — Assessment & Plan Note (Signed)
Summary: pt will come in fasting/njr pt/njr   Vital Signs:  Patient profile:   77 year old male Weight:      230 pounds BMI:     30.45 Pulse rate:   78 / minute Pulse rhythm:   regular BP sitting:   104 / 72  (left arm) Cuff size:   regular  Vitals Entered By: Raechel Ache, RN (March 17, 2009 8:51 AM)  CC:  OV and fasting. Sees cardiologist..  History of Present Illness: 77 year old patient seen today for a comprehensive evaluation.  He is followed closely by cardiology with left ventricular dysfunction and chronic atrial for ablation.  He is on chronic anticoagulation.  He has hypertension and a history of dyslipidemia.  He has a exogenous  obesity, and a history of colonic polyps.  He is doing quite well today without concerns or complaints.  Denies any cardiopulmonary difficulties.  Weight is unchanged since his last visit here  Preventive Screening-Counseling & Management  Caffeine-Diet-Exercise     Does Patient Exercise: yes  Problems Prior to Update: 1)  Atrial Fibrillation  (ICD-427.31) 2)  Anticoagulation Therapy  (ICD-V58.61) 3)  Palpitations  (ICD-785.1) 4)  Left Ventricular Function, Decreased  (ICD-429.2) 5)  Hypertension, Unspecified  (ICD-401.9) 6)  Hyperlipidemia  (ICD-272.4) 7)  Obesity  (ICD-278.00) 8)  Encounter For Therapeutic Drug Monitoring  (ICD-V58.83) 9)  Special Screening Malignant Neoplasm of Prostate  (ICD-V76.44) 10)  Family History Diabetes 1st Degree Relative  (ICD-V18.0) 11)  Family History of Colon Ca 1st Degree Relative <60  (ICD-V16.0) 12)  Diverticulitis, Hx of  (ICD-V12.79) 13)  Colonic Polyps, Hx of  (ICD-V12.72)  Medications Prior to Update: 1)  Warfarin Sodium 5 Mg  Tabs (Warfarin Sodium) .... One  Tablet Monday, Wednesday, Friday and One, and One Half Tablets 4 Times Weekly 2)  Diltiazem Hcl Er Beads 360 Mg Xr24h-Cap (Diltiazem Hcl Er Beads) .... Take One Capsule By Mouth Daily  Allergies: 1)  ! Codeine  Past History:  Past  Medical History: Reviewed history from 01/20/2009 and no changes required. 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) PALPITATIONS (ICD-785.1) LEFT VENTRICULAR FUNCTION, DECREASED (ICD-429.2) HYPERTENSION, UNSPECIFIED (ICD-401.9) HYPERLIPIDEMIA (ICD-272.4) OBESITY (ICD-278.00) ENCOUNTER FOR THERAPEUTIC DRUG MONITORING (ICD-V58.83) SPECIAL SCREENING MALIGNANT NEOPLASM OF PROSTATE (ICD-V76.44) FAMILY HISTORY DIABETES 1ST DEGREE RELATIVE (ICD-V18.0) FAMILY HISTORY OF COLON CA 1ST DEGREE RELATIVE <60 (ICD-V16.0) DIVERTICULITIS, HX OF (ICD-V12.79) COLONIC POLYPS, HX OF (ICD-V12.72)   COLONIC POLYPS, HX OF (ICD-V12.72)  Past Surgical History: Colonoscopy December 2005 Anal Fistula Repair Arthroscopic R Knee Tonsillectomy negative ETT 1991 Cataract extraction 2004 status post balloon valvuloplasty at [facility] in 1996 2-D echocardiogram November 2009  Family History: Reviewed history from 03/13/2008 and no changes required. Family History of Colon CA 1st degree relative <60 Family History Diabetes 1st degree relative Family History of Prostate CA 1st degree relative <50 Family History of Cardiovascular disorder   father died age 56 of a myocardial infarction mother died age 71, colon cancer  Two brothers 4 sisters  Positive for coronary disease.  Two siblings, status post stenting to siblings with heart disease.  One.  Status post pacemaker insertion.  One brother with prostate cancer, status post RT  Social History: Reviewed history from 07/31/2007 and no changes required. Retired Never Smoked Alcohol use-no Married Regular exercise-yes:  [employer] 4 times/week Does Patient Exercise:  yes  Review of Systems  The patient denies anorexia, fever, weight loss, weight gain, vision loss, decreased hearing, hoarseness, chest pain, syncope,  dyspnea on exertion, peripheral edema, prolonged cough, headaches, hemoptysis,  abdominal pain, melena, hematochezia, severe indigestion/heartburn, hematuria, incontinence, genital sores, muscle weakness, suspicious skin lesions, transient blindness, difficulty walking, depression, unusual weight change, abnormal bleeding, enlarged lymph nodes, angioedema, breast masses, and testicular masses.    Physical Exam  General:  overweight-appearing.  104/64overweight-appearing.   Head:  Normocephalic and atraumatic without obvious abnormalities. No apparent alopecia or balding. Eyes:  No corneal or conjunctival inflammation noted. EOMI. Perrla. Funduscopic exam benign, without hemorrhages, exudates or papilledema. Vision grossly normal. Ears:  External ear exam shows no significant lesions or deformities.  Otoscopic examination reveals clear canals, tympanic membranes are intact bilaterally without bulging, retraction, inflammation or discharge. Hearing is grossly normal bilaterally.  cerumen right canal Nose:  External nasal examination shows no deformity or inflammation. Nasal mucosa are pink and moist without lesions or exudates. Mouth:  Oral mucosa and oropharynx without lesions or exudates.  Teeth in good repair. Neck:  No deformities, masses, or tenderness noted. Chest Wall:  No deformities, masses, tenderness or gynecomastia noted. Breasts:  No masses or gynecomastia noted Lungs:  Normal respiratory effort, chest expands symmetrically. Lungs are clear to auscultation, no crackles or wheezes. Heart:  irhythm, with a controlled ventricular response; no audible murmur Abdomen:  obese soft and nontender.  No organomegaly Rectal:  No external abnormalities noted. Normal sphincter tone. No rectal masses or tenderness. Genitalia:  Testes bilaterally descended without nodularity, tenderness or masses. No scrotal masses or lesions. No penis lesions or urethral discharge. Prostate:  1+ enlarged.  1+ enlarged.   Msk:  No deformity or scoliosis noted of thoracic or lumbar spine.     Pulses:  R and L carotid,radial,femoral,dorsalis pedis and posterior tibial pulses are full and equal bilaterally Extremities:  No clubbing, cyanosis, edema, or deformity noted with normal full range of motion of all joints.   Neurologic:  No cranial nerve deficits noted. Station and gait are normal. Plantar reflexes are down-going bilaterally. DTRs are symmetrical throughout. Sensory, motor and coordinative functions appear intact. Skin:  Intact without suspicious lesions or rashes Cervical Nodes:  No lymphadenopathy noted Axillary Nodes:  No palpable lymphadenopathy Inguinal Nodes:  No significant adenopathy Psych:  Cognition and judgment appear intact. Alert and cooperative with normal attention span and concentration. No apparent delusions, illusions, hallucinations   Impression & Recommendations:  Problem # 1:  ATRIAL FIBRILLATION (ICD-427.31)  His updated medication list for this problem includes:    Warfarin Sodium 5 Mg Tabs (Warfarin sodium) ..... One  tablet monday, wednesday, friday and one, and one half tablets 4 times weekly    Diltiazem Hcl Er Beads 360 Mg Xr24h-cap (Diltiazem hcl er beads) .Marland Kitchen... Take one capsule by mouth daily  His updated medication list for this problem includes:    Warfarin Sodium 5 Mg Tabs (Warfarin sodium) ..... One  tablet monday, wednesday, friday and one, and one half tablets 4 times weekly    Diltiazem Hcl Er Beads 360 Mg Xr24h-cap (Diltiazem hcl er beads) .Marland Kitchen... Take one capsule by mouth daily  Orders: Prescription Created Electronically [id number]) TLB-BMP (Basic Metabolic Panel-BMET) (80048-METABOL) TLB-CBC Platelet - w/Differential (85025-CBCD) TLB-Hepatic/Liver Function Pnl (80076-HEPATIC) TLB-TSH (Thyroid Stimulating Hormone) (84443-TSH)  Problem # 2:  ANTICOAGULATION THERAPY (ICD-V58.61)  Orders: Protime ([id number])  Problem # 3:  LEFT VENTRICULAR FUNCTION, DECREASED (ICD-429.2)  His updated medication list for this problem includes:     Diltiazem Hcl Er Beads 360 Mg Xr24h-cap (Diltiazem hcl er beads) .Marland Kitchen... Take one capsule by mouth daily  His updated medication list for this problem includes:    Diltiazem Hcl Er Beads 360 Mg Xr24h-cap (Diltiazem hcl er beads) .Marland Kitchen... Take one capsule by mouth daily  Orders: Prescription Created Electronically [phone]) TLB-BMP (Basic Metabolic Panel-BMET) (80048-METABOL) TLB-CBC Platelet - w/Differential (85025-CBCD) TLB-Hepatic/Liver Function Pnl (80076-HEPATIC)  Problem # 4:  HYPERTENSION, UNSPECIFIED (ICD-401.9)  His updated medication list for this problem includes:    Diltiazem Hcl Er Beads 360 Mg Xr24h-cap (Diltiazem hcl er beads) .Marland Kitchen... Take one capsule by mouth daily  His updated medication list for this problem includes:    Diltiazem Hcl Er Beads 360 Mg Xr24h-cap (Diltiazem hcl er beads) .Marland Kitchen... Take one capsule by mouth daily  Orders: Prescription Created Electronically [phone]) TLB-BMP (Basic Metabolic Panel-BMET) (80048-METABOL) TLB-CBC Platelet - w/Differential (85025-CBCD) TLB-Hepatic/Liver Function Pnl (80076-HEPATIC)  Problem # 5:  HYPERLIPIDEMIA (ICD-272.4)  Orders: Prescription Created Electronically [phone]) Venipuncture (52841) TLB-Lipid Panel (80061-LIPID) TLB-BMP (Basic Metabolic Panel-BMET) (80048-METABOL) TLB-CBC Platelet - w/Differential (85025-CBCD) TLB-Hepatic/Liver Function Pnl (80076-HEPATIC)  Complete Medication List: 1)  Warfarin Sodium 5 Mg Tabs (Warfarin sodium) .... One  tablet monday, wednesday, friday and one, and one half tablets 4 times weekly 2)  Diltiazem Hcl Er Beads 360 Mg Xr24h-cap (Diltiazem hcl er beads) .... Take one capsule by mouth daily 3)  Viagra 50 Mg Tabs (Sildenafil citrate) .... One daily as directed  Other Orders: Tetanus Toxoid w/Dx (32440) Admin 1st Vaccine (10272) DRE (G0102)  Patient Instructions: 1)  Please schedule a follow-up appointment in 3 months. 2)  Limit your Sodium (Salt). 3)  It is important that you  exercise regularly at least 20 minutes 5 times a week. If you develop chest pain, have severe difficulty breathing, or feel very tired , stop exercising immediately and seek medical attention. 4)  You need to lose weight. Consider a lower calorie diet and regular exercise.  5)  monthly prothrombin times Prescriptions: VIAGRA 50 MG TABS (SILDENAFIL CITRATE) one daily as directed  #6 x 6   Entered and Authorized by:   Gordy Savers  MD   Signed by:   Gordy Savers  MD on 03/17/2009   Method used:   Print then Give to Patient   RxID:   [id number] DILTIAZEM HCL ER BEADS 360 MG XR24H-CAP (DILTIAZEM HCL ER BEADS) Take one capsule by mouth daily  #90 x 3   Entered and Authorized by:   Gordy Savers  MD   Signed by:   Gordy Savers  MD on 03/17/2009   Method used:   Print then Give to Patient   RxID:   [id number] WARFARIN SODIUM 5 MG  TABS (WARFARIN SODIUM) one  tablet Monday, Wednesday, Friday and one, and one half tablets 4 times weekly  #90 x 4   Entered and Authorized by:   Gordy Savers  MD   Signed by:   Gordy Savers  MD on 03/17/2009   Method used:   Print then Give to Patient   RxID:   [id number] DILTIAZEM HCL ER BEADS 360 MG XR24H-CAP (DILTIAZEM HCL ER BEADS) Take one capsule by mouth daily  #90 x 3   Entered and Authorized by:   Gordy Savers  MD   Signed by:   Gordy Savers  MD on 03/17/2009   Method used:   Electronically to        [facility]* (retail)       [address] [facility]  [place], [place]  [address]       Ph: [phone]       Fax: [phone]   RxID:   [id number] WARFARIN SODIUM 5 MG  TABS (WARFARIN SODIUM) one  tablet Monday, Wednesday, Friday and one, and one half tablets 4 times weekly  #90 x 4   Entered and Authorized by:   Gordy Savers  MD   Signed by:   Gordy Savers  MD on 03/17/2009   Method used:   Electronically to        [facility]*  (retail)       [address] [place], [place]  [address]       Ph: [phone]       Fax: [phone]   RxID:   [id number]    Immunizations Administered:  Tetanus Vaccine:    Vaccine Type: Td    Site: left deltoid    Mfr: Sanofi Pasteur    Dose: 0.5 ml    Route: IM    Given by: Raechel Ache, RN    Exp. Date: 11/10/2010    Lot #: [id number]    VIS given: 08/20/07 version given March 17, 2009.    ANTICOAGULATION RECORD PREVIOUS REGIMEN & LAB RESULTS Anticoagulation Diagnosis:  v58.83,v58.61,427.31 on  04/16/2008 Previous INR Goal Range:  2.0-3.0 on  04/16/2008 Previous INR:  2.1 on  02/15/2009 Previous Coumadin Dose(mg):  5mg  on m,w,f 7.5mg  other  on  08/24/2008 Previous Regimen:  same on  07/20/2008 Previous Coagulation Comments:  OV on  05/04/2008  NEW REGIMEN & LAB RESULTS Current INR: 2.6 Regimen: same  (no change)  Repeat testing in: 1 month  Anticoagulation Visit Questionnaire Coumadin dose missed/changed:  Yes Coumadin Dose Comments:  one or more missed dose(s) Abnormal Bleeding Symptoms:  No  Any diet changes including alcohol intake, vegetables or greens since the last visit:  No Any illnesses or hospitalizations since the last visit:  No Any signs of clotting since the last visit (including chest discomfort, dizziness, shortness of breath, arm tingling, slurred speech, swelling or redness in leg):  No  MEDICATIONS WARFARIN SODIUM 5 MG  TABS (WARFARIN SODIUM) one  tablet Monday, Wednesday, Friday and one, and one half tablets 4 times weekly DILTIAZEM HCL ER BEADS 360 MG XR24H-CAP (DILTIAZEM HCL ER BEADS) Take one capsule by mouth daily VIAGRA 50 MG TABS (SILDENAFIL CITRATE) one daily as directed   Laboratory Results   Blood Tests     PT: 19.7 s   (Normal Range: 10.6-13.4)  INR: 2.6   (Normal Range: 0.88-1.12   Therap INR: 2.0-3.5) Comments: Rita Ohara  March 17, 2009 10:45 AM

## 2010-11-01 NOTE — Assessment & Plan Note (Signed)
Summary: 4 momth fup//ccm   Vital Signs:  [name] Profile:   77 Years Old Male Weight:      230 pounds Temp:     98.2 degrees F oral Pulse rate:   84 / minute Pulse rhythm:   irregular BP sitting:   98 / 78  (left arm) Cuff size:   regular  Vitals Entered By: Raechel Ache, RN (December 03, 2008 8:13 AM)                 Chief Complaint:  4 mo ROV.Marland Kitchen  History of Present Illness: Glen [name] seen today for follow-up of his chronic atrial fibrillation  diagnosed approximately 10 months ago.  He is doing quite well on rate control and anticoagulation.  He denies any exercise intolerance.  Feels well and denies any other cardiopulmonary complaints.  He has a history of colonic polyps and mild dyslipidemia with a low HDL.  He has osteoarthritis, which is controlled with p.r.n. Tylenol only complaint today is ED    Current Allergies: ! CODEINE  Past Medical History:    Reviewed history from 03/13/2008 and no changes required:       Obesity       Colonic polyps, hx of       Diverticulitis, hx of       Hyperlipidemia       Anticoagulation therapy June 2009       Atrial fibrillation June 2009   Family History:    Reviewed history from 03/13/2008 and no changes required:       Family History of Colon CA 1st degree relative <60       Family History Diabetes 1st degree relative       Family History of Prostate CA 1st degree relative <50       Family History of Cardiovascular disorder                     father died age 83 of a myocardial infarction       mother died age 50, colon cancer              Two brothers 4 sisters              Positive for coronary disease.  Two siblings, status post stenting to siblings with heart disease.  One.  Status post pacemaker insertion.  One brother with prostate cancer, status post RT  Social History:    Reviewed history from 07/31/2007 and no changes required:       Retired       Never Smoked       Alcohol use-no    Review of  Systems  The [name] denies anorexia, fever, weight loss, weight gain, vision loss, decreased hearing, hoarseness, chest pain, syncope, dyspnea on exertion, peripheral edema, prolonged cough, headaches, hemoptysis, abdominal pain, melena, hematochezia, severe indigestion/heartburn, hematuria, incontinence, genital sores, muscle weakness, suspicious skin lesions, transient blindness, difficulty walking, depression, unusual weight change, abnormal bleeding, enlarged lymph nodes, angioedema, breast masses, and testicular masses.     Physical Exam  General:     overweight-appearing.  108/72 Head:     Normocephalic and atraumatic without obvious abnormalities. No apparent alopecia or balding. Eyes:     No corneal or conjunctival inflammation noted. EOMI. Perrla. Funduscopic exam benign, without hemorrhages, exudates or papilledema. Vision grossly normal. Mouth:     Oral mucosa and oropharynx without lesions or exudates.  Teeth in good repair. Neck:  No deformities, masses, or tenderness noted. Lungs:     Normal respiratory effort, chest expands symmetrically. Lungs are clear to auscultation, no crackles or wheezes. Heart:     irregular rhythm with controlled ventricular response Abdomen:     Bowel sounds positive,abdomen soft and non-tender without masses, organomegaly or hernias noted. Msk:     No deformity or scoliosis noted of thoracic or lumbar spine.   Pulses:     R and L carotid,radial,femoral,dorsalis pedis and posterior tibial pulses are full and equal bilaterally Extremities:     No clubbing, cyanosis, edema, or deformity noted with normal full range of motion of all joints.      Impression & Recommendations:  Problem # 1:  LEFT VENTRICULAR FUNCTION, DECREASED (ICD-429.2)  His updated medication list for this problem includes:    Diltiazem Hcl Coated Beads 240 Mg Cp24 (Diltiazem hcl coated beads) ..... One daily    Dilt-cd 120 Mg Xr24h-cap (Diltiazem hcl coated beads)  .Marland Kitchen... 1 once daily    Diltiazem Hcl Coated Beads 120 Mg Xr24h-cap (Diltiazem hcl coated beads) ..... One daily   Problem # 2:  ATRIAL FIBRILLATION (ICD-427.31)  His updated medication list for this problem includes:    Warfarin Sodium 5 Mg Tabs (Warfarin sodium) ..... One  tablet monday, wednesday, friday and one, and one half tablets 4 times weekly   Problem # 3:  HYPERLIPIDEMIA (ICD-272.4) will try niacin therapy; will return in 3 months for his annual exam and review a fasting lipid profile  Complete Medication List: 1)  Warfarin Sodium 5 Mg Tabs (Warfarin sodium) .... One  tablet monday, wednesday, friday and one, and one half tablets 4 times weekly 2)  Diltiazem Hcl Coated Beads 240 Mg Cp24 (Diltiazem hcl coated beads) .... One daily 3)  Dilt-cd 120 Mg Xr24h-cap (Diltiazem hcl coated beads) .Marland Kitchen.. 1 once daily 4)  Diltiazem Hcl Coated Beads 120 Mg Xr24h-cap (Diltiazem hcl coated beads) .... One daily   [name] Instructions: 1)  continue monthly monitoring of your Coumadin therapy at the Coumadin clinic 2)  Limit your Sodium (Salt). 3)  It is important that you exercise regularly at least 20 minutes 5 times a week. If you develop chest pain, have severe difficulty breathing, or feel very tired , stop exercising immediately and seek medical attention. 4)  You need to lose weight. Consider a lower calorie diet and regular exercise.  5)  try niacin 250 mg at bedtime  and  titrate to 500 mg

## 2010-11-01 NOTE — Assessment & Plan Note (Signed)
Summary: PT/NJR  Nurse Visit   Vital Signs:  Patient profile:   77 year old male Pulse rate:   91 / minute BP sitting:   98 / 67    Allergies: 1)  ! Codeine  Laboratory Results   Blood Tests     PT: 18.0 s   (Normal Range: 10.6-13.4)  INR: 2.1   (Normal Range: 0.88-1.12   Therap INR: 2.0-3.5) Comments: Rita Ohara  December 16, 2008 9:28 AM       Orders Added: 1)  Est. Patient Level I [99211] 2)  Fingerstick [36416] 3)  Protime Ila.Stager    ]   ANTICOAGULATION RECORD PREVIOUS REGIMEN & LAB RESULTS Anticoagulation Diagnosis:  v58.83,v58.61,427.31 on  04/16/2008 Previous INR Goal Range:  2.0-3.0 on  04/16/2008 Previous INR:  2.7 on  11/16/2008 Previous Coumadin Dose(mg):  5mg  on m,w,f 7.5mg  other  on  08/24/2008 Previous Regimen:  same on  07/20/2008 Previous Coagulation Comments:  OV on  05/04/2008  NEW REGIMEN & LAB RESULTS Current INR: 2.1 Regimen: same  (no change)  Repeat testing in: 4 weeks  Anticoagulation Visit Questionnaire Coumadin dose missed/changed:  No Abnormal Bleeding Symptoms:  No  Any diet changes including alcohol intake, vegetables or greens since the last visit:  No Any illnesses or hospitalizations since the last visit:  No Any signs of clotting since the last visit (including chest discomfort, dizziness, shortness of breath, arm tingling, slurred speech, swelling or redness in leg):  No  MEDICATIONS WARFARIN SODIUM 5 MG  TABS (WARFARIN SODIUM) one  tablet Monday, Wednesday, Friday and one, and one half tablets 4 times weekly DILTIAZEM HCL COATED BEADS 240 MG  CP24 (DILTIAZEM HCL COATED BEADS) one daily DILT-CD 120 MG XR24H-CAP (DILTIAZEM HCL COATED BEADS) 1 once daily DILTIAZEM HCL COATED BEADS 120 MG XR24H-CAP (DILTIAZEM HCL COATED BEADS) one daily     Vital Signs:  Patient Profile:   77 year old male Pulse rate:   91 / minute BP sitting:   98 / 67  (left arm)

## 2010-11-01 NOTE — Progress Notes (Signed)
Summary: dental procedure  Phone Note From Other Clinic   Caller: Dr Margaretha Seeds Summary of Call: Dr Margaretha Seeds would like to speak to Dr Daleen Squibb concerning this pt. Pt is scheduled to have in ofc dental surgery. Pt needs to come off coumadin couple days prior procedure. [phone] Initial call taken by: Edman Circle,  May 11, 2010 12:30 PM  Follow-up for Phone Call        he can stop coumadin for procedure. Follow-up by: Gaylord Shih, MD, [facility],  May 12, 2010 9:28 AM     Appended Document: dental procedure left mesage for Dr. Margaretha Seeds on his personal voice mail at office. Mylo Red RN

## 2010-11-01 NOTE — Assessment & Plan Note (Signed)
Summary: pt/njr  Nurse Visit   Vital Signs:  Patient Profile:   77 Years Old Male Pulse rate:   97 / minute BP sitting:   109 / 72  (left arm)                 Prior Medications: WARFARIN SODIUM 5 MG  TABS (WARFARIN SODIUM) one  and 1/2 daily DILTIAZEM HCL COATED BEADS 240 MG  CP24 (DILTIAZEM HCL COATED BEADS) one daily Current Allergies: ! CODEINE Laboratory Results   Blood Tests     PT: 20.5 s   (Normal Range: 10.6-13.4)  INR: 2.9   (Normal Range: 0.88-1.12   Therap INR: 2.0-3.5) Comments: Rita Ohara  July 20, 2008 1:41 PM       Orders Added: 1)  Est. Patient Level I [99211] 2)  Fingerstick [36416] 3)  Protime Ila.Stager    ]   ANTICOAGULATION RECORD PREVIOUS REGIMEN & LAB RESULTS Anticoagulation Diagnosis:  v58.83,v58.61,427.31 on  04/16/2008 Previous INR Goal Range:  2.0-3.0 on  04/16/2008 Previous INR:  2.1 on  06/15/2008 Previous Coumadin Dose(mg):  7.5mg  on  04/02/2008 Previous Regimen:  SAME DOSE on  05/18/2008 Previous Coagulation Comments:  OV on  05/04/2008  NEW REGIMEN & LAB RESULTS Current INR: 2.9 Regimen: same  Repeat testing in: 4 weeks  Anticoagulation Visit Questionnaire Coumadin dose missed/changed:  No Abnormal Bleeding Symptoms:  No  Any diet changes including alcohol intake, vegetables or greens since the last visit:  No Any illnesses or hospitalizations since the last visit:  No Any signs of clotting since the last visit (including chest discomfort, dizziness, shortness of breath, arm tingling, slurred speech, swelling or redness in leg):  No  MEDICATIONS WARFARIN SODIUM 5 MG  TABS (WARFARIN SODIUM) one  and 1/2 daily DILTIAZEM HCL COATED BEADS 240 MG  CP24 (DILTIAZEM HCL COATED BEADS) one daily

## 2010-11-01 NOTE — Assessment & Plan Note (Signed)
Summary: pt/njr  Nurse Visit   Vital Signs:  Patient Profile:   77 Years Old Male Pulse rate:   102 / minute BP sitting:   118 / 79  (right arm)                 Prior Medications: WARFARIN SODIUM 5 MG  TABS (WARFARIN SODIUM) one  and 1/2 daily DILTIAZEM HCL COATED BEADS 240 MG  CP24 (DILTIAZEM HCL COATED BEADS) one daily Current Allergies: ! CODEINE Laboratory Results   Blood Tests    Date/Time Reported: April 17, 2008 7:33 AM   PT: 18.4 s   (Normal Range: 10.6-13.4)  INR: 2.3   (Normal Range: 0.88-1.12   Therap INR: 2.0-3.5) Comments: Wynona Canes, CMA  April 17, 2008 7:33 AM       Orders Added: 1)  Est. Patient Level I [99211] 2)  Protime [[id number]] 3)  Fingerstick Xanthus.Diener    ]  ANTICOAGULATION RECORD PREVIOUS REGIMEN & LAB RESULTS   Previous INR:  4.3 on  04/02/2008 Previous Coumadin Dose(mg):  7.5mg  on  04/02/2008 Previous Regimen:  7.5mg  M,W,F 5mg  others on  04/02/2008 Previous Coagulation Comments:  Hold for one day then begin new regimen. on  04/02/2008  NEW REGIMEN & LAB RESULTS Anticoag. Dx: v58.83,v58.61,427.31 Current INR Goal Range: 2.0-3.0 Current INR: 2.3 Regimen: same dose       Repeat testing in: 2 weeks MEDICATIONS WARFARIN SODIUM 5 MG  TABS (WARFARIN SODIUM) one  and 1/2 daily DILTIAZEM HCL COATED BEADS 240 MG  CP24 (DILTIAZEM HCL COATED BEADS) one daily   Anticoagulation Visit Questionnaire      Coumadin dose missed/changed:  No      Abnormal Bleeding Symptoms:  No   Any diet changes including alcohol intake, vegetables or greens since the last visit:  No Any illnesses or hospitalizations since the last visit:  No Any signs of clotting since the last visit (including chest discomfort, dizziness, shortness of breath, arm tingling, slurred speech, swelling or redness in leg):  No   Laboratory Results   Blood Tests     PT: 18.4 s   (Normal Range: 10.6-13.4)  INR: 2.3   (Normal Range: 0.88-1.12   Therap INR: 2.0-3.5)  Comments: Wynona Canes, CMA  April 17, 2008 7:33 AM       Vital Signs:  Patient Profile:   77 Years Old Male Pulse rate:   102 / minute BP sitting:   118 / 79

## 2010-11-01 NOTE — Assessment & Plan Note (Signed)
Summary: 6 MO F/U /CY   Visit Type:  6 mo f/u Primary Provider:  Gordy Savers  MD  CC:  pt states he stays tired when he works out in the yard or goes to [employer]....denies any cardiac complaints today.  History of Present Illness: Mr Glen [name] comes in today for evaluation and management of his chronic atrial fibrillation.  He is doing well except for generalized fatigue. He denies orthopnea, PND or significant edema. He's had no palpitations speak but no syncope. He denies any bleeding on Coumadin.  He denies any exertional angina or chest discomfort. He denies any significant dyspnea on exertion.  Current Medications (verified): 1)  Warfarin Sodium 5 Mg  Tabs (Warfarin Sodium) .... One  Tablet Monday, Wednesday, Friday and One, and One Half Tablets 4 Times Weekly 2)  Diltiazem Hcl Er Beads 360 Mg Xr24h-Cap (Diltiazem Hcl Er Beads) .... Take One Capsule By Mouth Daily 3)  Viagra 50 Mg Tabs (Sildenafil Citrate) .... One Daily As Directed  Allergies: 1)  ! 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) PALPITATIONS (ICD-785.1) LEFT VENTRICULAR FUNCTION, DECREASED (ICD-429.2) HYPERTENSION, UNSPECIFIED (ICD-401.9) HYPERLIPIDEMIA (ICD-272.4) OBESITY (ICD-278.00) ENCOUNTER FOR THERAPEUTIC DRUG MONITORING (ICD-V58.83) SPECIAL SCREENING MALIGNANT NEOPLASM OF PROSTATE (ICD-V76.44) FAMILY HISTORY DIABETES 1ST DEGREE RELATIVE (ICD-V18.0) FAMILY HISTORY OF COLON CA 1ST DEGREE RELATIVE <60 (ICD-V16.0) DIVERTICULITIS, HX OF (ICD-V12.79) COLONIC POLYPS, HX OF (ICD-V12.72)   COLONIC POLYPS, HX OF (ICD-V12.72)  Past Surgical History: Last updated: 03/17/2009 Colonoscopy December 2005 Anal Fistula Repair Arthroscopic R Knee Tonsillectomy negative ETT 1991 Cataract extraction 2004 status post balloon valvul[facility] in 1996 2-D echocardiogram  November 2009  Family History: Last updated: 03/13/2008 Family History of Colon CA 1st degree relative 75 Family History Diabetes 1st degree relative Family History of Prostate CA 1st degree relative 75 Family History of Cardiovascular disorder   father died age 75 of a myocardial infarction mother died age 75, colon cancer  Two brothers 4 sisters  Positive for coronary disease.  Two siblings, status post stenting to siblings with heart disease.  One.  Status post pacemaker insertion.  One brother with prostate cancer, status post RT  Social History: Last updated: 03/17/2009 Retired Never Smoked Alcohol use-no Married Regular exercise-yes:  [employer] 4 times/week  Risk Factors: Exercise: yes (03/17/2009)  Risk Factors: Smoking Status: never (07/31/2007)  Review of Systems       negative other than history of present illness  Vital Signs:  Patient profile:   77 year old male Height:      73 inches Weight:      233 pounds BMI:     30.85 Pulse rate:   76 / minute Pulse rhythm:   regular BP sitting:   100 / 60  (left arm) Cuff size:   large  Vitals Entered By: Danielle Rankin, CMA (Feb 01, 2010 8:49 AM)  Physical Exam  General:  obese.   Head:  normocephalic and atraumatic Eyes:  PERRLA/EOM intact; conjunctiva and lids normal. Neck:  Neck supple, no JVD. No masses, thyromegaly or abnormal cervical nodes. Chest [place]:  no deformities or breast masses noted Lungs:  Clear bilaterally to auscultation and percussion. Heart:  difficult to appreciate PMI, no increased S1 intensity, normal S2 with variable rate and rhythm. Soft systolic murmur at the apex. No right ventricular lift. Abdomen:  Bowel sounds positive; abdomen soft and non-tender without masses, organomegaly, or hernias noted. No  hepatosplenomegaly. Msk:  Back normal, normal gait. Muscle strength and tone normal. Pulses:  pulses normal in all 4 extremities Extremities:  trace left pedal edema and trace right pedal  edema.   Neurologic:  Alert and oriented x 3. Skin:  Intact without lesions or rashes. Psych:  Normal affect.   EKG  Procedure date:  02/01/2010  Findings:      atrial fibrillation, nonspecific ST segment changes,  Impression & Recommendations:  Problem # 1:  ATRIAL FIBRILLATION (ICD-427.31) I will repeat 2-D echocardiogram to make any potential adjustments in medications. Otherwise he is doing well and I'll see him back in a year. I do not recall or see evidence of rheumatic heart disease from previous echocardiogram. It is noted as a problem under his past medical history. His updated medication list for this problem includes:    Warfarin Sodium 5 Mg Tabs (Warfarin sodium) ..... One  tablet monday, wednesday, friday and one, and one half tablets 4 times weekly  Orders: Echocardiogram (Echo) EKG w/ Interpretation (93000)  Problem # 2:  ANTICOAGULATION THERAPY (ICD-V58.61) Assessment: Unchanged  Problem # 3:  PALPITATIONS (ICD-785.1) Assessment: Improved  His updated medication list for this problem includes:    Warfarin Sodium 5 Mg Tabs (Warfarin sodium) ..... One  tablet monday, wednesday, friday and one, and one half tablets 4 times weekly    Diltiazem Hcl Er Beads 360 Mg Xr24h-cap (Diltiazem hcl er beads) .Marland Kitchen... Take one capsule by mouth daily  Patient Instructions: 1)  Your physician recommends that you schedule a follow-up appointment in: YEAR WITH DR Vidal Lampkins 2)  Your physician recommends that you continue on your current medications as directed. Please refer to the Current Medication list given to you today. 3)  Your physician has requested that you have an echocardiogram.  Echocardiography is a painless test that uses sound waves to create images of your heart. It provides your doctor with information about the size and shape of your heart and how well your heart's chambers and valves are working.  This procedure takes approximately one hour. There are no restrictions for  this procedure.

## 2010-11-01 NOTE — Assessment & Plan Note (Signed)
Summary: pt/mm  Nurse Visit   Allergies: 1)  ! Codeine Laboratory Results   Blood Tests     PT: 22.3 s   (Normal Range: 10.6-13.4)  INR: 3.4   (Normal Range: 0.88-1.12   Therap INR: 2.0-3.5) Comments: Pt. started taking dosage at nite. Joanne Chars CMA  August 16, 2009 10:59 AM     Orders Added: 1)  Est. Patient Level I [99211] 2)  Protime [[id number]]   ANTICOAGULATION RECORD PREVIOUS REGIMEN & LAB RESULTS Anticoagulation Diagnosis:  v58.83,v58.61,427.31 on  04/16/2008 Previous INR Goal Range:  2.0-3.0 on  04/16/2008 Previous INR:  2.0 on  07/16/2009 Previous Coumadin Dose(mg):  5mg  on m,w,f 7.5mg  other  on  08/24/2008 Previous Regimen:  same on  07/16/2009 Previous Coagulation Comments:  Patient stated he couldn't come for a month. I gave him a card saying three weeks and also verbaly told him Dr. Kirtland Bouchard said 3 weeks. on  06/15/2009  NEW REGIMEN & LAB RESULTS Current INR: 3.4 Regimen: Hold x2 days and resume 5mg  on Mondays, Weds. and Fridays, and 7.5mg  all other days. RTO 4 wks.   Anticoagulation Visit Questionnaire Coumadin dose missed/changed:  No Abnormal Bleeding Symptoms:  No  Any diet changes including alcohol intake, vegetables or greens since the last visit:  No Any illnesses or hospitalizations since the last visit:  No Any signs of clotting since the last visit (including chest discomfort, dizziness, shortness of breath, arm tingling, slurred speech, swelling or redness in leg):  No  MEDICATIONS WARFARIN SODIUM 5 MG  TABS (WARFARIN SODIUM) one  tablet Monday, Wednesday, Friday and one, and one half tablets 4 times weekly DILTIAZEM HCL ER BEADS 360 MG XR24H-CAP (DILTIAZEM HCL ER BEADS) Take one capsule by mouth daily VIAGRA 50 MG TABS (SILDENAFIL CITRATE) one daily as directed

## 2010-11-01 NOTE — Assessment & Plan Note (Signed)
Summary: PT // RS  Nurse Visit   Vital Signs:  Patient profile:   77 year old male Temp:     98.0 degrees F oral  Vitals Entered By: Duard Brady LPN (August 04, 2010 11:14 AM) Flu Vaccine Consent Questions     Do you have a history of severe allergic reactions to this vaccine? no    Any prior history of allergic reactions to egg and/or gelatin? no    Do you have a sensitivity to the preservative Thimersol? no    Do you have a past history of Guillan-Barre Syndrome? no    Do you currently have an acute febrile illness? no    Have you ever had a severe reaction to latex? no    Vaccine information given and explained to patient? yes    Are you currently pregnant? no    Lot Number:[id number]   Exp Date:04/01/2011   Site Given  Left Deltoid IM  Allergies: 1)  ! Codeine Laboratory Results   Blood Tests      INR: 1.8   (Normal Range: 0.88-1.12   Therap INR: 2.0-3.5) Comments: Rita Ohara  August 04, 2010 9:30 AM     Orders Added: 1)  Est. Patient Level I [99211] 2)  Protime [[id number]] 3)  Flu Vaccine 27yrs + MEDICARE PATIENTS [Q2039] 4)  Administration Flu vaccine - MCR [G0008]   ANTICOAGULATION RECORD PREVIOUS REGIMEN & LAB RESULTS Anticoagulation Diagnosis:  v58.83,v58.61,427.31 on  04/16/2008 Previous INR Goal Range:  2.0-3.0 on  04/16/2008 Previous INR:  1.9 on  06/30/2010 Previous Coumadin Dose(mg):  5mg  on m,w,f 7.5mg  other  on  08/24/2008 Previous Regimen:  same on  05/05/2010 Previous Coagulation Comments:  Patient stated he couldn't come for a month. I gave him a card saying three weeks and also verbaly told him Dr. Kirtland Bouchard said 3 weeks. on  06/15/2009  NEW REGIMEN & LAB RESULTS Current INR: 1.8 Regimen: 7.5mg . today then 5mg . Mon. & Thurs. all others 7.5mg .  Repeat testing in: 4 week  Anticoagulation Visit Questionnaire Coumadin dose missed/changed:  No Abnormal Bleeding Symptoms:  No  Any diet changes including alcohol intake, vegetables or  greens since the last visit:  No Any illnesses or hospitalizations since the last visit:  No Any signs of clotting since the last visit (including chest discomfort, dizziness, shortness of breath, arm tingling, slurred speech, swelling or redness in leg):  No  MEDICATIONS WARFARIN SODIUM 5 MG  TABS (WARFARIN SODIUM) one  tablet Monday, Wednesday, Friday and one, and one half tablets 4 times weekly DILTIAZEM HCL ER BEADS 360 MG XR24H-CAP (DILTIAZEM HCL ER BEADS) Take one capsule by mouth daily VIAGRA 50 MG TABS (SILDENAFIL CITRATE) one daily as directed

## 2010-11-01 NOTE — Assessment & Plan Note (Signed)
Summary: pt//ccm  Nurse Visit   Allergies: 1)  ! Codeine Laboratory Results   Blood Tests   Date/Time Received: Feb 07, 2010 10:32 AM  Date/Time Reported: Feb 07, 2010 10:32 AM   PT: 19.3 s   (Normal Range: 10.6-13.4)  INR: 2.5   (Normal Range: 0.88-1.12   Therap INR: 2.0-3.5) Comments: Wynona Canes, CMA  Feb 07, 2010 10:32 AM     Orders Added: 1)  Est. Patient Level I [99211] 2)  Protime [[id number]]  Laboratory Results   Blood Tests     PT: 19.3 s   (Normal Range: 10.6-13.4)  INR: 2.5   (Normal Range: 0.88-1.12   Therap INR: 2.0-3.5) Comments: Wynona Canes, CMA  Feb 07, 2010 10:32 AM        ANTICOAGULATION RECORD PREVIOUS REGIMEN & LAB RESULTS Anticoagulation Diagnosis:  v58.83,v58.61,427.31 on  04/16/2008 Previous INR Goal Range:  2.0-3.0 on  04/16/2008 Previous INR:  2.5 on  01/07/2010 Previous Coumadin Dose(mg):  5mg  on m,w,f 7.5mg  other  on  08/24/2008 Previous Regimen:  same on  01/07/2010 Previous Coagulation Comments:  Patient stated he couldn't come for a month. I gave him a card saying three weeks and also verbaly told him Dr. Kirtland Bouchard said 3 weeks. on  06/15/2009  NEW REGIMEN & LAB RESULTS Current INR: 2.5 Regimen: same  (no change)       Repeat testing in: 4 weeks MEDICATIONS WARFARIN SODIUM 5 MG  TABS (WARFARIN SODIUM) one  tablet Monday, Wednesday, Friday and one, and one half tablets 4 times weekly DILTIAZEM HCL ER BEADS 360 MG XR24H-CAP (DILTIAZEM HCL ER BEADS) Take one capsule by mouth daily VIAGRA 50 MG TABS (SILDENAFIL CITRATE) one daily as directed   Anticoagulation Visit Questionnaire      Coumadin dose missed/changed:  No      Abnormal Bleeding Symptoms:  No   Any diet changes including alcohol intake, vegetables or greens since the last visit:  No Any illnesses or hospitalizations since the last visit:  No Any signs of clotting since the last visit (including chest discomfort, dizziness, shortness of breath, arm tingling,  slurred speech, swelling or redness in leg):  No

## 2010-11-01 NOTE — Assessment & Plan Note (Signed)
Summary: pt/njr  Nurse Visit   Allergies: 1)  ! Codeine Laboratory Results   Blood Tests     PT: 17.3 s   (Normal Range: 10.6-13.4)  INR: 2.0   (Normal Range: 0.88-1.12   Therap INR: 2.0-3.5) Comments: Joanne Chars CMA  July 16, 2009 11:54 AM     Orders Added: 1)  Est. Patient Level I [99211] 2)  Protime [[id number]]    ANTICOAGULATION RECORD PREVIOUS REGIMEN & LAB RESULTS Anticoagulation Diagnosis:  v58.83,v58.61,427.31 on  04/16/2008 Previous INR Goal Range:  2.0-3.0 on  04/16/2008 Previous INR:  1.6 on  06/15/2009 Previous Coumadin Dose(mg):  5mg  on m,w,f 7.5mg  other  on  08/24/2008 Previous Regimen:  same on  06/15/2009 Previous Coagulation Comments:  Patient stated he couldn't come for a month. I gave him a card saying three weeks and also verbaly told him Dr. Kirtland Bouchard said 3 weeks. on  06/15/2009  NEW REGIMEN & LAB RESULTS Current INR: 2.0 Regimen: same  Repeat testing in: 1 month  Anticoagulation Visit Questionnaire Coumadin dose missed/changed:  No Abnormal Bleeding Symptoms:  No  Any diet changes including alcohol intake, vegetables or greens since the last visit:  No Any illnesses or hospitalizations since the last visit:  No Any signs of clotting since the last visit (including chest discomfort, dizziness, shortness of breath, arm tingling, slurred speech, swelling or redness in leg):  No  MEDICATIONS WARFARIN SODIUM 5 MG  TABS (WARFARIN SODIUM) one  tablet Monday, Wednesday, Friday and one, and one half tablets 4 times weekly DILTIAZEM HCL ER BEADS 360 MG XR24H-CAP (DILTIAZEM HCL ER BEADS) Take one capsule by mouth daily VIAGRA 50 MG TABS (SILDENAFIL CITRATE) one daily as directed

## 2010-11-01 NOTE — Assessment & Plan Note (Signed)
Summary: pt/njr  Nurse Visit   Vital Signs:  Patient Profile:   77 Years Old Male Pulse rate:   80 / minute BP sitting:   127 / 72  (right arm)                 Prior Medications: WARFARIN SODIUM 5 MG  TABS (WARFARIN SODIUM) one  and 1/2 daily DILTIAZEM HCL COATED BEADS 240 MG  CP24 (DILTIAZEM HCL COATED BEADS) one daily Current Allergies: ! CODEINE Laboratory Results   Blood Tests    Date/Time Reported: June 15, 2008 3:47 PM   PT: 17.9 s   (Normal Range: 10.6-13.4)  INR: 2.1   (Normal Range: 0.88-1.12   Therap INR: 2.0-3.5) Comments: Wynona Canes, CMA  June 15, 2008 3:47 PM       Orders Added: 1)  Est. Patient Level I [99211] 2)  Protime [[id number]] 3)  Fingerstick Xanthus.Diener    ] Laboratory Results   Blood Tests     PT: 17.9 s   (Normal Range: 10.6-13.4)  INR: 2.1   (Normal Range: 0.88-1.12   Therap INR: 2.0-3.5) Comments: Wynona Canes, CMA  June 15, 2008 3:47 PM       ANTICOAGULATION RECORD PREVIOUS REGIMEN & LAB RESULTS Anticoagulation Diagnosis:  v58.83,v58.61,427.31 on  04/16/2008 Previous INR Goal Range:  2.0-3.0 on  04/16/2008 Previous INR:  2.3 on  05/18/2008 Previous Coumadin Dose(mg):  7.5mg  on  04/02/2008 Previous Regimen:  SAME DOSE on  05/18/2008 Previous Coagulation Comments:  OV on  05/04/2008  NEW REGIMEN & LAB RESULTS Current INR: 2.1 Regimen: SAME DOSE  (no change)  MEDICATIONS WARFARIN SODIUM 5 MG  TABS (WARFARIN SODIUM) one  and 1/2 daily DILTIAZEM HCL COATED BEADS 240 MG  CP24 (DILTIAZEM HCL COATED BEADS) one daily   Anticoagulation Visit Questionnaire      Coumadin dose missed/changed:  No      Abnormal Bleeding Symptoms:  No   Any diet changes including alcohol intake, vegetables or greens since the last visit:  No Any illnesses or hospitalizations since the last visit:  No Any signs of clotting since the last visit (including chest discomfort, dizziness, shortness of breath, arm tingling,  slurred speech, swelling or redness in leg):  No

## 2010-11-01 NOTE — Assessment & Plan Note (Signed)
Summary: pt/njr  Nurse Visit   Allergies: 1)  ! Codeine Laboratory Results   Blood Tests     PT: 19.2 s   (Normal Range: 10.6-13.4)  INR: 2.5   (Normal Range: 0.88-1.12   Therap INR: 2.0-3.5) Comments: Rita Ohara  January 07, 2010 11:26 AM     Orders Added: 1)  Est. Patient Level I [99211] 2)  Protime [[id number]]   ANTICOAGULATION RECORD PREVIOUS REGIMEN & LAB RESULTS Anticoagulation Diagnosis:  v58.83,v58.61,427.31 on  04/16/2008 Previous INR Goal Range:  2.0-3.0 on  04/16/2008 Previous INR:  2.3 on  12/10/2009 Previous Coumadin Dose(mg):  5mg  on m,w,f 7.5mg  other  on  08/24/2008 Previous Regimen:  same on  11/12/2009 Previous Coagulation Comments:  Patient stated he couldn't come for a month. I gave him a card saying three weeks and also verbaly told him Dr. Kirtland Bouchard said 3 weeks. on  06/15/2009  NEW REGIMEN & LAB RESULTS Current INR: 2.5 Regimen: same  Repeat testing in: 1 month  Anticoagulation Visit Questionnaire Coumadin dose missed/changed:  No Abnormal Bleeding Symptoms:  No  Any diet changes including alcohol intake, vegetables or greens since the last visit:  No Any illnesses or hospitalizations since the last visit:  No Any signs of clotting since the last visit (including chest discomfort, dizziness, shortness of breath, arm tingling, slurred speech, swelling or redness in leg):  Yes  MEDICATIONS WARFARIN SODIUM 5 MG  TABS (WARFARIN SODIUM) one  tablet Monday, Wednesday, Friday and one, and one half tablets 4 times weekly DILTIAZEM HCL ER BEADS 360 MG XR24H-CAP (DILTIAZEM HCL ER BEADS) Take one capsule by mouth daily VIAGRA 50 MG TABS (SILDENAFIL CITRATE) one daily as directed

## 2010-11-01 NOTE — Assessment & Plan Note (Signed)
Summary: pt/njr  Nurse Visit   Allergies: 1)  ! Codeine Laboratory Results   Blood Tests     PT: 19.5 s   (Normal Range: 10.6-13.4)  INR: 2.6   (Normal Range: 0.88-1.12   Therap INR: 2.0-3.5) Comments: Joanne Chars CMA  May 14, 2009 9:53 AM     Orders Added: 1)  Est. Patient Level I [99211] 2)  Protime [[id number]] 3)  Fingerstick [11914]   ANTICOAGULATION RECORD PREVIOUS REGIMEN & LAB RESULTS Anticoagulation Diagnosis:  v58.83,v58.61,427.31 on  04/16/2008 Previous INR Goal Range:  2.0-3.0 on  04/16/2008 Previous INR:  2.4 on  04/16/2009 Previous Coumadin Dose(mg):  5mg  on m,w,f 7.5mg  other  on  08/24/2008 Previous Regimen:  same on  07/20/2008 Previous Coagulation Comments:  OV on  05/04/2008  NEW REGIMEN & LAB RESULTS Current INR: 2.6 Regimen: same  (no change)   Anticoagulation Visit Questionnaire Coumadin dose missed/changed:  No Abnormal Bleeding Symptoms:  No  Any diet changes including alcohol intake, vegetables or greens since the last visit:  No Any illnesses or hospitalizations since the last visit:  No Any signs of clotting since the last visit (including chest discomfort, dizziness, shortness of breath, arm tingling, slurred speech, swelling or redness in leg):  No  MEDICATIONS WARFARIN SODIUM 5 MG  TABS (WARFARIN SODIUM) one  tablet Monday, Wednesday, Friday and one, and one half tablets 4 times weekly DILTIAZEM HCL ER BEADS 360 MG XR24H-CAP (DILTIAZEM HCL ER BEADS) Take one capsule by mouth daily VIAGRA 50 MG TABS (SILDENAFIL CITRATE) one daily as directed

## 2010-11-01 NOTE — Assessment & Plan Note (Signed)
Summary: pt/njr  Nurse Visit   Allergies: 1)  ! Codeine Laboratory Results   Blood Tests     PT: 15.9 s   (Normal Range: 10.6-13.4)  INR: 1.7   (Normal Range: 0.88-1.12   Therap INR: 2.0-3.5) Comments: Rita Ohara  November 12, 2009 4:31 PM     Orders Added: 1)  Est. Patient Level I [99211] 2)  Protime [[id number]]   ANTICOAGULATION RECORD PREVIOUS REGIMEN & LAB RESULTS Anticoagulation Diagnosis:  v58.83,v58.61,427.31 on  04/16/2008 Previous INR Goal Range:  2.0-3.0 on  04/16/2008 Previous INR:  2.3 on  10/14/2009 Previous Coumadin Dose(mg):  5mg  on m,w,f 7.5mg  other  on  08/24/2008 Previous Regimen:  same on  10/14/2009 Previous Coagulation Comments:  Patient stated he couldn't come for a month. I gave him a card saying three weeks and also verbaly told him Dr. Kirtland Bouchard said 3 weeks. on  06/15/2009  NEW REGIMEN & LAB RESULTS Current INR: 1.7 Regimen: same  Repeat testing in: 1 month  Anticoagulation Visit Questionnaire Coumadin dose missed/changed:  No Abnormal Bleeding Symptoms:  No  Any diet changes including alcohol intake, vegetables or greens since the last visit:  No Any illnesses or hospitalizations since the last visit:  No Any signs of clotting since the last visit (including chest discomfort, dizziness, shortness of breath, arm tingling, slurred speech, swelling or redness in leg):  No  MEDICATIONS WARFARIN SODIUM 5 MG  TABS (WARFARIN SODIUM) one  tablet Monday, Wednesday, Friday and one, and one half tablets 4 times weekly DILTIAZEM HCL ER BEADS 360 MG XR24H-CAP (DILTIAZEM HCL ER BEADS) Take one capsule by mouth daily VIAGRA 50 MG TABS (SILDENAFIL CITRATE) one daily as directed

## 2010-11-01 NOTE — Assessment & Plan Note (Signed)
Summary: pt/mm  Nurse Visit   Vital Signs:  Patient Profile:   77 Years Old Male Pulse rate:   90 / minute BP sitting:   118 / 67  (left arm)                 Prior Medications: WARFARIN SODIUM 5 MG  TABS (WARFARIN SODIUM) one  tablet Monday, Wednesday, Friday and one, and one half tablets 4 times weekly DILTIAZEM HCL COATED BEADS 240 MG  CP24 (DILTIAZEM HCL COATED BEADS) one daily DILT-CD 120 MG XR24H-CAP (DILTIAZEM HCL COATED BEADS) 1 once daily DILTIAZEM HCL COATED BEADS 120 MG XR24H-CAP (DILTIAZEM HCL COATED BEADS) one daily Current Allergies: ! CODEINE Laboratory Results   Blood Tests     PT: 16.3 s   (Normal Range: 10.6-13.4)  INR: 1.7   (Normal Range: 0.88-1.12   Therap INR: 2.0-3.5) Comments: Rita Ohara  October 19, 2008 10:02 AM       Orders Added: 1)  Est. Patient Level I [99211] 2)  Fingerstick [36416] 3)  Protime Ila.Stager    ]   ANTICOAGULATION RECORD PREVIOUS REGIMEN & LAB RESULTS Anticoagulation Diagnosis:  v58.83,v58.61,427.31 on  04/16/2008 Previous INR Goal Range:  2.0-3.0 on  04/16/2008 Previous INR:  2.2 on  09/18/2008 Previous Coumadin Dose(mg):  5mg  on m,w,f 7.5mg  other  on  08/24/2008 Previous Regimen:  same on  07/20/2008 Previous Coagulation Comments:  OV on  05/04/2008  NEW REGIMEN & LAB RESULTS Current INR: 1.7 Regimen: same  (no change)  Repeat testing in: 4 weeks  Anticoagulation Visit Questionnaire Coumadin dose missed/changed:  No Abnormal Bleeding Symptoms:  No  Any diet changes including alcohol intake, vegetables or greens since the last visit:  No Any illnesses or hospitalizations since the last visit:  No Any signs of clotting since the last visit (including chest discomfort, dizziness, shortness of breath, arm tingling, slurred speech, swelling or redness in leg):  No  MEDICATIONS WARFARIN SODIUM 5 MG  TABS (WARFARIN SODIUM) one  tablet Monday, Wednesday, Friday and one, and one half tablets 4 times  weekly DILTIAZEM HCL COATED BEADS 240 MG  CP24 (DILTIAZEM HCL COATED BEADS) one daily DILT-CD 120 MG XR24H-CAP (DILTIAZEM HCL COATED BEADS) 1 once daily DILTIAZEM HCL COATED BEADS 120 MG XR24H-CAP (DILTIAZEM HCL COATED BEADS) one daily     Vital Signs:  Patient Profile:   77 Years Old Male Pulse rate:   90 / minute BP sitting:   118 / 67

## 2010-11-01 NOTE — Assessment & Plan Note (Signed)
Summary: ear pain/dm   Vital Signs:  Patient profile:   77 year old male Weight:      228 pounds BMI:     30.19 Temp:     97.7 degrees F oral Pulse rate:   78 / minute Pulse rhythm:   regular BP sitting:   116 / 76  (left arm) Cuff size:   regular  Vitals Entered By: Raechel Ache, RN (August 11, 2009 10:11 AM) CC: C/o R ear pain and congestion. Is Patient Diabetic? No Flu Vaccine Consent Questions     Do you have a history of severe allergic reactions to this vaccine? no    Any prior history of allergic reactions to egg and/or gelatin? no    Do you have a sensitivity to the preservative Thimersol? no    Do you have a past history of Guillan-Barre Syndrome? no    Do you currently have an acute febrile illness? no    Have you ever had a severe reaction to latex? no    Vaccine information given and explained to patient? yes    Are you currently pregnant? no    Lot Number:[id number]   Exp Date:03/31/2010   Site Given  Left Deltoid IM   Primary Care Provider:  Gordy Savers  MD  CC:  C/o R ear pain and congestion.Marland Kitchen  History of Present Illness: 77 year old patient, who presents with a several day history of diminished auditory acuity on the right.  He has a history of hypertension, atrial  fibrillation and dyslipidemia.  His cardiopulmonary  status has been stable.  Remains on chronic Coumadin.  Allergies: 1)  ! Codeine  Physical Exam  General:  Well-developed,well-nourished,in no acute distress; alert,appropriate and cooperative throughout examination; 130/72 Ears:  right canal occluded with cerumen; this was irrigated until clear   Impression & Recommendations:  Problem # 1:  CERUMEN IMPACTION, RIGHT (ICD-380.4)  Problem # 2:  ATRIAL FIBRILLATION (ICD-427.31)  His updated medication list for this problem includes:    Warfarin Sodium 5 Mg Tabs (Warfarin sodium) ..... One  tablet monday, wednesday, friday and one, and one half tablets 4 times weekly  Diltiazem Hcl Er Beads 360 Mg Xr24h-cap (Diltiazem hcl er beads) .Marland Kitchen... Take one capsule by mouth daily  Complete Medication List: 1)  Warfarin Sodium 5 Mg Tabs (Warfarin sodium) .... One  tablet monday, wednesday, friday and one, and one half tablets 4 times weekly 2)  Diltiazem Hcl Er Beads 360 Mg Xr24h-cap (Diltiazem hcl er beads) .... Take one capsule by mouth daily 3)  Viagra 50 Mg Tabs (Sildenafil citrate) .... One daily as directed  Other Orders: Flu Vaccine 85yrs + (60454) Administration Flu vaccine - MCR (U9811)  Patient Instructions: 1)  Please schedule a follow-up appointment as needed.

## 2010-11-01 NOTE — Miscellaneous (Signed)
Summary: Diltiazem dose change  Clinical Lists Changes  Medications: Added new medication of DILTIAZEM HCL ER BEADS 360 MG XR24H-CAP (DILTIAZEM HCL ER BEADS) Take one capsule by mouth daily - Signed Removed medication of DILT-CD 120 MG XR24H-CAP (DILTIAZEM HCL COATED BEADS) 1 once daily Removed medication of DILTIAZEM HCL COATED BEADS 240 MG  CP24 (DILTIAZEM HCL COATED BEADS) one daily Rx of DILTIAZEM HCL ER BEADS 360 MG XR24H-CAP (DILTIAZEM HCL ER BEADS) Take one capsule by mouth daily;  #90 x 3;  Signed;  Entered by: Bernita Raisin, RN, BSN;  Authorized by: Gaylord Shih, MD, [facility];  Method used: Electronically to [facility]*, [address] [place], [place], [place]  [address], Ph: [phone], Fax: [phone]    Prescriptions: DILTIAZEM HCL ER BEADS 360 MG XR24H-CAP (DILTIAZEM HCL ER BEADS) Take one capsule by mouth daily  #90 x 3   Entered by:   Bernita Raisin, RN, BSN   Authorized by:   Gaylord Shih, MD, [facility]   Signed by:   Bernita Raisin, RN, BSN on 01/22/2009   Method used:   Electronically to        [facility]* (retail)       [address] [place], [place]  [address]       Ph: [phone]       Fax: [phone]   RxID:   [id number]

## 2010-11-01 NOTE — Assessment & Plan Note (Signed)
Summary: PT/NJR  Nurse Visit   Allergies: 1)  ! Codeine Laboratory Results   Blood Tests      INR: 2.4   (Normal Range: 0.88-1.12   Therap INR: 2.0-3.5) Comments: Rita Ohara  September 06, 2010 11:03 AM     Orders Added: 1)  Est. Patient Level I [99211] 2)  Protime [40981XB]   ANTICOAGULATION RECORD PREVIOUS REGIMEN & LAB RESULTS Anticoagulation Diagnosis:  v58.83,v58.61,427.31 on  04/16/2008 Previous INR Goal Range:  2.0-3.0 on  04/16/2008 Previous INR:  1.8 on  08/04/2010 Previous Coumadin Dose(mg):  5mg  on m,w,f 7.5mg  other  on  08/24/2008 Previous Regimen:  7.5mg . today then 5mg . Mon. & Thurs. all others 7.5mg . on  08/04/2010 Previous Coagulation Comments:  Patient stated he couldn't come for a month. I gave him a card saying three weeks and also verbaly told him Dr. Kirtland Bouchard said 3 weeks. on  06/15/2009  NEW REGIMEN & LAB RESULTS Current INR: 2.4 Regimen: same  Repeat testing in: 4 weeks  Anticoagulation Visit Questionnaire Coumadin dose missed/changed:  No Abnormal Bleeding Symptoms:  No  Any diet changes including alcohol intake, vegetables or greens since the last visit:  No Any illnesses or hospitalizations since the last visit:  No Any signs of clotting since the last visit (including chest discomfort, dizziness, shortness of breath, arm tingling, slurred speech, swelling or redness in leg):  No  MEDICATIONS WARFARIN SODIUM 5 MG  TABS (WARFARIN SODIUM) one  tablet Monday, Wednesday, Friday and one, and one half tablets 4 times weekly DILTIAZEM HCL ER BEADS 360 MG XR24H-CAP (DILTIAZEM HCL ER BEADS) Take one capsule by mouth daily VIAGRA 50 MG TABS (SILDENAFIL CITRATE) one daily as directed

## 2010-11-01 NOTE — Assessment & Plan Note (Signed)
Summary: pt/njr  Nurse Visit   Allergies: 1)  ! Codeine Laboratory Results   Blood Tests   Date/Time Received: December 10, 2009 12:41 PM  Date/Time Reported: December 10, 2009 12:40 PM   PT: 18.4 s   (Normal Range: 10.6-13.4)  INR: 2.3   (Normal Range: 0.88-1.12   Therap INR: 2.0-3.5) Comments: Wynona Canes, CMA  December 10, 2009 12:41 PM     Orders Added: 1)  Est. Patient Level I [99211] 2)  Protime [[address]UE]  Laboratory Results   Blood Tests     PT: 18.4 s   (Normal Range: 10.6-13.4)  INR: 2.3   (Normal Range: 0.88-1.12   Therap INR: 2.0-3.5) Comments: Wynona Canes, CMA  December 10, 2009 12:41 PM       ANTICOAGULATION RECORD PREVIOUS REGIMEN & LAB RESULTS Anticoagulation Diagnosis:  v58.83,v58.61,427.31 on  04/16/2008 Previous INR Goal Range:  2.0-3.0 on  04/16/2008 Previous INR:  1.7 on  11/12/2009 Previous Coumadin Dose(mg):  5mg  on m,w,f 7.5mg  other  on  08/24/2008 Previous Regimen:  same on  11/12/2009 Previous Coagulation Comments:  Patient stated he couldn't come for a month. I gave him a card saying three weeks and also verbaly told him Dr. Kirtland Bouchard said 3 weeks. on  06/15/2009  NEW REGIMEN & LAB RESULTS Current INR: 2.3 Regimen: same  (no change)       Repeat testing in: 4 weeks MEDICATIONS WARFARIN SODIUM 5 MG  TABS (WARFARIN SODIUM) one  tablet Monday, Wednesday, Friday and one, and one half tablets 4 times weekly DILTIAZEM HCL ER BEADS 360 MG XR24H-CAP (DILTIAZEM HCL ER BEADS) Take one capsule by mouth daily VIAGRA 50 MG TABS (SILDENAFIL CITRATE) one daily as directed   Anticoagulation Visit Questionnaire      Coumadin dose missed/changed:  No      Abnormal Bleeding Symptoms:  No   Any diet changes including alcohol intake, vegetables or greens since the last visit:  No Any illnesses or hospitalizations since the last visit:  No Any signs of clotting since the last visit (including chest discomfort, dizziness, shortness of breath, arm tingling,  slurred speech, swelling or redness in leg):  No

## 2010-11-01 NOTE — Assessment & Plan Note (Signed)
Summary: pt/njr  Nurse Visit   Allergies: 1)  ! Codeine Laboratory Results   Blood Tests   Date/Time Received: June 30, 2010 12:32 PM  Date/Time Reported: June 30, 2010 12:32 PM    INR: 1.9   (Normal Range: 0.88-1.12   Therap INR: 2.0-3.5) Comments: Wynona Canes, CMA  June 30, 2010 12:32 PM     Orders Added: 1)  Est. Patient Level I [99211] 2)  Protime [[address]UE]  Laboratory Results   Blood Tests      INR: 1.9   (Normal Range: 0.88-1.12   Therap INR: 2.0-3.5) Comments: Wynona Canes, CMA  June 30, 2010 12:32 PM       ANTICOAGULATION RECORD PREVIOUS REGIMEN & LAB RESULTS Anticoagulation Diagnosis:  v58.83,v58.61,427.31 on  04/16/2008 Previous INR Goal Range:  2.0-3.0 on  04/16/2008 Previous INR:  2.3 on  06/01/2010 Previous Coumadin Dose(mg):  5mg  on m,w,f 7.5mg  other  on  08/24/2008 Previous Regimen:  same on  05/05/2010 Previous Coagulation Comments:  Patient stated he couldn't come for a month. I gave him a card saying three weeks and also verbaly told him Dr. Kirtland Bouchard said 3 weeks. on  06/15/2009  NEW REGIMEN & LAB RESULTS Current INR: 1.9 Regimen: same  (no change)       Repeat testing in: 4 weeks MEDICATIONS WARFARIN SODIUM 5 MG  TABS (WARFARIN SODIUM) one  tablet Monday, Wednesday, Friday and one, and one half tablets 4 times weekly DILTIAZEM HCL ER BEADS 360 MG XR24H-CAP (DILTIAZEM HCL ER BEADS) Take one capsule by mouth daily VIAGRA 50 MG TABS (SILDENAFIL CITRATE) one daily as directed   Anticoagulation Visit Questionnaire      Coumadin dose missed/changed:  No      Abnormal Bleeding Symptoms:  No   Any diet changes including alcohol intake, vegetables or greens since the last visit:  No Any illnesses or hospitalizations since the last visit:  No Any signs of clotting since the last visit (including chest discomfort, dizziness, shortness of breath, arm tingling, slurred speech, swelling or redness in leg):  No

## 2010-11-01 NOTE — Assessment & Plan Note (Signed)
Summary: EMP/ PT FASTING/MHF   Vital Signs:  Patient Profile:   77 Years [place] [name] Male Weight:      256 pounds Temp:     98.2 degrees F BP sitting:   122 / 68  (left arm) Cuff size:   regular  Vitals Entered By: Raechel Ache, RN (March 13, 2008 9:41 AM)                 Chief Complaint:  OV and fasting.Marland Kitchen  History of Present Illness: [place] [name] seen today for an annual exam.  He has enjoyed excellent health.  Does have a history of obesity, osteoarthritis, colonic polyps and diverticulosis.  He states that for the past 3 months.  He is noticed an occasional irregular palpitations.  He does not feel that this is on a continuous basis.  He notes symptoms more when he is at rest and quiet.  He denies any cardiopulmonary complaints and has no dyspnea on exertion or any exertional chest pain.  He has not noted any pedal edema.    Current Allergies: ! CODEINE  Past Medical History:    Reviewed history from 07/31/2007 and no changes required:       Obesity       Colonic polyps, hx of       Diverticulitis, hx of       Hyperlipidemia       Anticoagulation therapy June 2009       Atrial fibrillation June 2009  Past Surgical History:    Reviewed history from 07/31/2007 and no changes required:       ColonoscopyDecember 2005       Anal Fistula Repair       Arthroscopic R Knee       Tonsillectomy       negative ETT 1991       Cataract extraction 2004   Family History:    Reviewed history from 07/31/2007 and no changes required:       Family History of Colon CA 1st degree relative <60       Family History Diabetes 1st degree relative       Family History of Prostate CA 1st degree relative <50       Family History of Cardiovascular disorder                     father died age 71 of a myocardial infarction       mother died age 81, colon cancer              Two brothers 4 sisters              Positive for coronary disease.  Two siblings, status post stenting to siblings with  heart disease.  One.  Status post pacemaker insertion.  One brother with prostate cancer, status post RT  Social History:    Reviewed history from 07/31/2007 and no changes required:       Retired       Never Smoked       Alcohol use-no    Review of Systems  The patient denies anorexia, fever, weight loss, weight gain, vision loss, decreased hearing, hoarseness, chest pain, syncope, dyspnea on exertion, peripheral edema, prolonged cough, headaches, hemoptysis, abdominal pain, melena, hematochezia, severe indigestion/heartburn, hematuria, incontinence, genital sores, muscle weakness, suspicious skin lesions, transient blindness, difficulty walking, depression, unusual weight change, abnormal bleeding, enlarged lymph nodes, angioedema, breast masses, and testicular masses.     Physical Exam  General:     overweight-appearing.  110/70; pulse rate 120 Head:     Normocephalic and atraumatic without obvious abnormalities. No apparent alopecia or balding. Eyes:     No corneal or conjunctival inflammation noted. EOMI. Perrla. Funduscopic exam benign, without hemorrhages, exudates or papilledema. Vision grossly normal. Ears:     External ear exam shows no significant lesions or deformities.  Otoscopic examination reveals clear canals, tympanic membranes are intact bilaterally without bulging, retraction, inflammation or discharge. Hearing is grossly normal bilaterally. Nose:     External nasal examination shows no deformity or inflammation. Nasal mucosa are pink and moist without lesions or exudates. Mouth:     Oral mucosa and oropharynx without lesions or exudates.  Teeth in good repair. Neck:     No deformities, masses, or tenderness noted. Chest Wall:     No deformities, masses, tenderness or gynecomastia noted. Breasts:     No masses or gynecomastia noted Lungs:     Normal respiratory effort, chest expands symmetrically. Lungs are clear to auscultation, no crackles or  wheezes. Heart:     rhythm irregularly  irregular; no murmur; rate 120 Abdomen:     obese soft and nontender.  No organomegaly Rectal:     No external abnormalities noted. Normal sphincter tone. No rectal masses or tenderness. Genitalia:     Testes bilaterally descended without nodularity, tenderness or masses. No scrotal masses or lesions. No penis lesions or urethral discharge. Prostate:     plus one.  Enlarged Msk:     No deformity or scoliosis noted of thoracic or lumbar spine.   Pulses:     R and L carotid,radial,femoral,dorsalis pedis and posterior tibial pulses are full and equal bilaterally Extremities:     No clubbing, cyanosis, edema, or deformity noted with normal full range of motion of all joints.   Neurologic:     No cranial nerve deficits noted. Station and gait are normal. Plantar reflexes are down-going bilaterally. DTRs are symmetrical throughout. Sensory, motor and coordinative functions appear intact. Skin:     Intact without suspicious lesions or rashes Cervical Nodes:     No lymphadenopathy noted Axillary Nodes:     No palpable lymphadenopathy Inguinal Nodes:     No significant adenopathy Psych:     Cognition and judgment appear intact. Alert and cooperative with normal attention span and concentration. No apparent delusions, illusions, hallucinations    Impression & Recommendations:  Problem # 1:  ANTICOAGULATION THERAPY (ICD-V58.61)  Orders: Venipuncture (64403) TLB-Lipid Panel (80061-LIPID) TLB-BMP (Basic Metabolic Panel-BMET) (80048-METABOL) TLB-CBC Platelet - w/Differential (85025-CBCD) TLB-Hepatic/Liver Function Pnl (80076-HEPATIC)   Problem # 2:  ATRIAL FIBRILLATION (ICD-427.31)  His updated medication list for this problem includes:    Warfarin Sodium 5 Mg Tabs (Warfarin sodium) ..... One daily  Orders: Cardiology Referral (Cardiology) Venipuncture [phone]) TLB-Lipid Panel (80061-LIPID) TLB-BMP (Basic Metabolic Panel-BMET)  (80048-METABOL) TLB-CBC Platelet - w/Differential (85025-CBCD) TLB-Hepatic/Liver Function Pnl (80076-HEPATIC) TLB-TSH (Thyroid Stimulating Hormone) (84443-TSH)   Problem # 3:  HYPERLIPIDEMIA (ICD-272.4)  Orders: EKG w/ Interpretation (93000) Venipuncture (95638) TLB-Lipid Panel (80061-LIPID) TLB-BMP (Basic Metabolic Panel-BMET) (80048-METABOL) TLB-CBC Platelet - w/Differential (85025-CBCD) TLB-Hepatic/Liver Function Pnl (80076-HEPATIC)   Problem # 4:  COLONIC POLYPS, HX OF (ICD-V12.72)  Orders: Venipuncture (75643) TLB-Lipid Panel (80061-LIPID) TLB-BMP (Basic Metabolic Panel-BMET) (80048-METABOL) TLB-CBC Platelet - w/Differential (85025-CBCD) TLB-Hepatic/Liver Function Pnl (80076-HEPATIC)   Complete Medication List: 1)  Warfarin Sodium 5 Mg Tabs (Warfarin sodium) .... One daily 2)  Diltiazem Hcl Coated Beads 240 Mg Cp24 (Diltiazem hcl coated  beads) .... One daily  Other Orders: DRE (G0102)   Patient Instructions: 1)   return office visit in 3 days 2)  Limit your Sodium (Salt). 3)  It is important that you exercise regularly at least 20 minutes 5 times a week. If you develop chest pain, have severe difficulty breathing, or feel very tired , stop exercising immediately and seek medical attention. 4)  You need to lose weight. Consider a lower calorie diet and regular exercise.    Prescriptions: DILTIAZEM HCL COATED BEADS 240 MG  CP24 (DILTIAZEM HCL COATED BEADS) one daily  #30 x 0   Entered and Authorized by:   Gordy Savers  MD   Signed by:   Gordy Savers  MD on 03/13/2008   Method used:   Print then Give to Patient   RxID:   [id number] WARFARIN SODIUM 5 MG  TABS (WARFARIN SODIUM) one daily  #30 x 0   Entered and Authorized by:   Gordy Savers  MD   Signed by:   Gordy Savers  MD on 03/13/2008   Method used:   Print then Give to Patient   RxID:   [id number]  ]

## 2010-11-01 NOTE — Assessment & Plan Note (Signed)
Summary: pt will come in fasting./njr also pt/njr   Vital Signs:  Patient profile:   77 year old male Height:      72.75 inches Weight:      240 pounds BMI:     32.00 Temp:     98.0 degrees F oral BP sitting:   108 / 70  (left arm) Cuff size:   regular  Vitals Entered By: Duard Brady LPN (April 11, 2010 9:21 AM) CC: cpx - donig well Is Patient Diabetic? No   Primary Care Provider:  Gordy Savers  MD  CC:  cpx - donig well.  History of Present Illness: 77 year old patient who is seen today for a comprehensive evaluation.  He is followed closely cardiology due to chronic atrial for ablation.  He is on warfarin anticoagulation.  He has a history of hypertension, mild, dyslipidemia, and exogenous obesity.  He has a history of colonic polyps and a family history of colon cancer.  His cardiopulmonary status has been stable.  As the attending weight gain since his last visit here.  Here for Medicare AWV:  1.   Risk factors based on Past M, S, F history:  risk factors include a family history of prostate and colon cancer. 2.   Physical Activities: fairly sedentary, but no exercise restrictions 3.   Depression/mood: no history of depression or mood disorder 4.   Hearing: no hearing deficits 5.   ADL's: completely independent in all aspects of daily living 6.   Fall Risk: low 7.   Home Safety: no problems identified 8.   Height, weight, &visual acuity: unremarkable except for a 10-pound weight gain over the past 6 months 9.   Counseling: heart healthy diet calorie restriction and more exercise.  All encouraged 10.   Labs ordered based on risk factors:  laboratory profile, including lipid panel will be reviewed.  PSA test in discuss and it was elected to defer 11.           Referral Coordination-  will consider a colonoscopy follow-up in 6 months 12.           Care Plan- heart healthy diet restricted salt and weight loss.  All encouraged 13.            Cognitive Assessment-  alert and oriented, with normal affect.  Several family members have early dementia   Preventive Screening-Counseling & Management  Alcohol-Tobacco     Smoking Status: never  Allergies: 1)  ! Codeine  Past History:  Past Medical History: ATRIAL FIBRILLATION (ICD-427.31) ANTICOAGULATION THERAPY (ICD-V58.61)  PALPITATIONS (ICD-785.1) LEFT VENTRICULAR FUNCTION, DECREASED (ICD-429.2) HYPERTENSION, UNSPECIFIED (ICD-401.9) HYPERLIPIDEMIA (ICD-272.4) OBESITY (ICD-278.00) ENCOUNTER FOR THERAPEUTIC DRUG MONITORING (ICD-V58.83) SPECIAL SCREENING MALIGNANT NEOPLASM OF PROSTATE (ICD-V76.44) FAMILY HISTORY DIABETES 1ST DEGREE RELATIVE (ICD-V18.0) FAMILY HISTORY OF COLON CA 1ST DEGREE RELATIVE <60 (ICD-V16.0) DIVERTICULITIS, HX OF (ICD-V12.79) COLONIC POLYPS, HX OF (ICD-V12.72)   COLONIC POLYPS, HX OF (ICD-V12.72)  Past Surgical History: Colonoscopy December 2005 Anal Fistula Repair Arthroscopic R Knee Tonsillectomy negative ETT 1991 Cataract extraction 2004 status post balloon valvuloplasty at [facility] in 1996 2-D echocardiogram November 2009, 2011  Family History: Reviewed history from 03/13/2008 and no changes required. Family History of Colon CA 1st degree relative <60 Family History Diabetes 1st degree relative Family History of Prostate CA 1st degree relative <50 Family History of Cardiovascular disorder   father died age 80 of a myocardial infarction mother died age 75, colon cancer  Two brothers 4 sisters  Positive for coronary disease.  Two siblings, status post stenting to siblings with heart disease.  One.  Status post pacemaker insertion.  One brother with prostate cancer, status post RT  Social History: Reviewed history from 03/17/2009 and no changes required. Retired Never Smoked Alcohol use-no Married Regular exercise-yes:  [employer] 4 times/week  Review of Systems       The patient complains of weight gain.  The patient denies anorexia, fever, weight loss,  vision loss, decreased hearing, hoarseness, chest pain, syncope, dyspnea on exertion, peripheral edema, prolonged cough, headaches, hemoptysis, abdominal pain, melena, hematochezia, severe indigestion/heartburn, hematuria, incontinence, genital sores, muscle weakness, suspicious skin lesions, transient blindness, difficulty walking, depression, unusual weight change, abnormal bleeding, enlarged lymph nodes, angioedema, breast masses, and testicular masses.    Physical Exam  General:  overweight-appearing.  110/70overweight-appearing.   Head:  Normocephalic and atraumatic without obvious abnormalities. No apparent alopecia or balding. Eyes:  No corneal or conjunctival inflammation noted. EOMI. Perrla. Funduscopic exam benign, without hemorrhages, exudates or papilledema. Vision grossly normal. Ears:  External ear exam shows no significant lesions or deformities.  Otoscopic examination reveals clear canals, tympanic membranes are intact bilaterally without bulging, retraction, inflammation or discharge. Hearing is grossly normal bilaterally. Nose:  External nasal examination shows no deformity or inflammation. Nasal mucosa are pink and moist without lesions or exudates. Mouth:  Oral mucosa and oropharynx without lesions or exudates.  Teeth in good repair. Neck:  No deformities, masses, or tenderness noted. Chest Wall:  No deformities, masses, tenderness or gynecomastia noted. Breasts:  No masses or gynecomastia noted Lungs:  Normal respiratory effort, chest expands symmetrically. Lungs are clear to auscultation, no crackles or wheezes. Heart:  Normal rate and regular rhythm. S1 and S2 normal without gallop, murmur, click, rub or other extra sounds. Abdomen:  Bowel sounds positive,abdomen soft and non-tender without masses, organomegaly or hernias noted. Rectal:  No external abnormalities noted. Normal sphincter tone. No rectal masses or tenderness. Genitalia:  Testes bilaterally descended without  nodularity, tenderness or masses. No scrotal masses or lesions. No penis lesions or urethral discharge. Prostate:  1+ enlarged.  1+ enlarged.   Msk:  No deformity or scoliosis noted of thoracic or lumbar spine.   Pulses:  R and L carotid,radial,femoral,dorsalis pedis and posterior tibial pulses are full and equal bilaterally Extremities:  1+ left pedal edema and 1+ right pedal edema.  1+ left pedal edema.   Neurologic:  No cranial nerve deficits noted. Station and gait are normal. Plantar reflexes are down-going bilaterally. DTRs are symmetrical throughout. Sensory, motor and coordinative functions appear intact. Skin:  Intact without suspicious lesions or rashes Cervical Nodes:  No lymphadenopathy noted Axillary Nodes:  No palpable lymphadenopathy Inguinal Nodes:  No significant adenopathy Psych:  Cognition and judgment appear intact. Alert and cooperative with normal attention span and concentration. No apparent delusions, illusions, hallucinations   Impression & Recommendations:  Problem # 1:  PREVENTIVE HEALTH CARE (ICD-V70.0)  Orders: First annual wellness visit with prevention plan  (J8119) Venipuncture (14782) TLB-Lipid Panel (80061-LIPID) TLB-BMP (Basic Metabolic Panel-BMET) (80048-METABOL) TLB-CBC Platelet - w/Differential (85025-CBCD) TLB-Hepatic/Liver Function Pnl (80076-HEPATIC) TLB-TSH (Thyroid Stimulating Hormone) (84443-TSH)  Problem # 2:  ATRIAL FIBRILLATION (ICD-427.31)  His updated medication list for this problem includes:    Warfarin Sodium 5 Mg Tabs (Warfarin sodium) ..... One  tablet monday, wednesday, friday and one, and one half tablets 4 times weekly    Diltiazem Hcl Er Beads 360 Mg Xr24h-cap (Diltiazem hcl er beads) .Marland Kitchen... Take one capsule by mouth daily  His updated medication list for this problem includes:    Warfarin Sodium 5 Mg Tabs (Warfarin sodium) ..... One  tablet monday, wednesday, friday and one, and one half tablets 4 times weekly     Diltiazem Hcl Er Beads 360 Mg Xr24h-cap (Diltiazem hcl er beads) .Marland Kitchen... Take one capsule by mouth daily  Orders: Venipuncture (19147) TLB-Lipid Panel (80061-LIPID) TLB-BMP (Basic Metabolic Panel-BMET) (80048-METABOL) TLB-CBC Platelet - w/Differential (85025-CBCD) TLB-Hepatic/Liver Function Pnl (80076-HEPATIC) TLB-TSH (Thyroid Stimulating Hormone) (84443-TSH) Protime (82956OZ)  Problem # 3:  HYPERTENSION, UNSPECIFIED (ICD-401.9)  His updated medication list for this problem includes:    Diltiazem Hcl Er Beads 360 Mg Xr24h-cap (Diltiazem hcl er beads) .Marland Kitchen... Take one capsule by mouth daily  His updated medication list for this problem includes:    Diltiazem Hcl Er Beads 360 Mg Xr24h-cap (Diltiazem hcl er beads) .Marland Kitchen... Take one capsule by mouth daily  Problem # 4:  HYPERLIPIDEMIA (ICD-272.4)  Orders: Venipuncture (30865) TLB-Lipid Panel (80061-LIPID) TLB-BMP (Basic Metabolic Panel-BMET) (80048-METABOL) TLB-CBC Platelet - w/Differential (85025-CBCD) TLB-Hepatic/Liver Function Pnl (80076-HEPATIC) TLB-TSH (Thyroid Stimulating Hormone) (84443-TSH)  Problem # 5:  OBESITY (ICD-278.00)  Complete Medication List: 1)  Warfarin Sodium 5 Mg Tabs (Warfarin sodium) .... One  tablet monday, wednesday, friday and one, and one half tablets 4 times weekly 2)  Diltiazem Hcl Er Beads 360 Mg Xr24h-cap (Diltiazem hcl er beads) .... Take one capsule by mouth daily 3)  Viagra 50 Mg Tabs (Sildenafil citrate) .... One daily as directed  Patient Instructions: 1)  Please schedule a follow-up appointment in 6 months. 2)  Limit your Sodium (Salt). 3)  It is important that you exercise regularly at least 20 minutes 5 times a week. If you develop chest pain, have severe difficulty breathing, or feel very tired , stop exercising immediately and seek medical attention. 4)  You need to lose weight. Consider a lower calorie diet and regular exercise.  Prescriptions: VIAGRA 50 MG TABS (SILDENAFIL CITRATE) one daily  as directed  #6 x 6   Entered and Authorized by:   Gordy Savers  MD   Signed by:   Gordy Savers  MD on 04/11/2010   Method used:   Electronically to        [facility]* (retail)       [address] [place], [place]  [address]       Ph: [phone]       Fax: [phone]   RxID:   [id number] DILTIAZEM HCL ER BEADS 360 MG XR24H-CAP (DILTIAZEM HCL ER BEADS) Take one capsule by mouth daily  #90 x 3   Entered and Authorized by:   Gordy Savers  MD   Signed by:   Gordy Savers  MD on 04/11/2010   Method used:   Electronically to        [facility]* (retail)       [address] [place], [place]  [address]       Ph: [phone]       Fax: [phone]   RxID:   [id number] WARFARIN SODIUM 5 MG  TABS (WARFARIN SODIUM) one  tablet Monday, Wednesday, Friday and one, and one half tablets 4 times weekly  #127 x 4   Entered and Authorized by:   Gordy Savers  MD   Signed by:  Gordy Savers  MD on 04/11/2010   Method used:   Electronically to        [facility]* (retail)       [address] [place], [place]  [address]       Ph: [phone]       Fax: [phone]   RxID:   [id number]   Appended Document: pt will come in fasting./njr also pt/njr  Laboratory Results   Blood Tests      INR: 1.9   (Normal Range: 0.88-1.12   Therap INR: 2.0-3.5) Comments: Rita Ohara  April 11, 2010 11:06 AM       ANTICOAGULATION RECORD PREVIOUS REGIMEN & LAB RESULTS Anticoagulation Diagnosis:  v58.83,v58.61,427.31 on  04/16/2008 Previous INR Goal Range:  2.0-3.0 on  04/16/2008 Previous INR:  2.4 on  03/10/2010 Previous Coumadin Dose(mg):  5mg  on m,w,f 7.5mg  other  on  08/24/2008 Previous Regimen:  same on  03/10/2010 Previous Coagulation Comments:  Patient stated he couldn't come for a month. I gave him a card saying three weeks and also  verbaly told him Dr. Kirtland Bouchard said 3 weeks. on  06/15/2009  NEW REGIMEN & LAB RESULTS Current INR: 1.9 Regimen: same  Repeat testing in: 4 weeks  Anticoagulation Visit Questionnaire Coumadin dose missed/changed:  Yes Abnormal Bleeding Symptoms:  No  Any diet changes including alcohol intake, vegetables or greens since the last visit:  No Any illnesses or hospitalizations since the last visit:  No Any signs of clotting since the last visit (including chest discomfort, dizziness, shortness of breath, arm tingling, slurred speech, swelling or redness in leg):  No  MEDICATIONS WARFARIN SODIUM 5 MG  TABS (WARFARIN SODIUM) one  tablet Monday, Wednesday, Friday and one, and one half tablets 4 times weekly DILTIAZEM HCL ER BEADS 360 MG XR24H-CAP (DILTIAZEM HCL ER BEADS) Take one capsule by mouth daily VIAGRA 50 MG TABS (SILDENAFIL CITRATE) one daily as directed

## 2010-11-01 NOTE — Assessment & Plan Note (Signed)
Summary: pt/njr  Nurse Visit   Vital Signs:  Patient Profile:   77 Years Old Male Pulse rate:   92 / minute BP sitting:   116 / 73  (left arm)                 Prior Medications: WARFARIN SODIUM 5 MG  TABS (WARFARIN SODIUM) one  tablet Monday, Wednesday, Friday and one, and one half tablets 4 times weekly DILTIAZEM HCL COATED BEADS 240 MG  CP24 (DILTIAZEM HCL COATED BEADS) one daily DILT-CD 120 MG XR24H-CAP (DILTIAZEM HCL COATED BEADS) 1 once daily DILTIAZEM HCL COATED BEADS 120 MG XR24H-CAP (DILTIAZEM HCL COATED BEADS) one daily Current Allergies: ! CODEINE Laboratory Results   Blood Tests     PT: 19.9 s   (Normal Range: 10.6-13.4)  INR: 2.7   (Normal Range: 0.88-1.12   Therap INR: 2.0-3.5) Comments: Rita Ohara  November 16, 2008 10:19 AM       Orders Added: 1)  Est. Patient Level I [99211] 2)  Fingerstick [36416] 3)  Protime Ila.Stager    ]   ANTICOAGULATION RECORD PREVIOUS REGIMEN & LAB RESULTS Anticoagulation Diagnosis:  v58.83,v58.61,427.31 on  04/16/2008 Previous INR Goal Range:  2.0-3.0 on  04/16/2008 Previous INR:  1.7 on  10/19/2008 Previous Coumadin Dose(mg):  5mg  on m,w,f 7.5mg  other  on  08/24/2008 Previous Regimen:  same on  07/20/2008 Previous Coagulation Comments:  OV on  05/04/2008  NEW REGIMEN & LAB RESULTS Current INR: 2.7 Regimen: same  (no change)  Repeat testing in: 4 weeks  Anticoagulation Visit Questionnaire Coumadin dose missed/changed:  No Abnormal Bleeding Symptoms:  No  Any diet changes including alcohol intake, vegetables or greens since the last visit:  No Any illnesses or hospitalizations since the last visit:  No Any signs of clotting since the last visit (including chest discomfort, dizziness, shortness of breath, arm tingling, slurred speech, swelling or redness in leg):  No  MEDICATIONS WARFARIN SODIUM 5 MG  TABS (WARFARIN SODIUM) one  tablet Monday, Wednesday, Friday and one, and one half tablets 4 times  weekly DILTIAZEM HCL COATED BEADS 240 MG  CP24 (DILTIAZEM HCL COATED BEADS) one daily DILT-CD 120 MG XR24H-CAP (DILTIAZEM HCL COATED BEADS) 1 once daily DILTIAZEM HCL COATED BEADS 120 MG XR24H-CAP (DILTIAZEM HCL COATED BEADS) one daily     Vital Signs:  Patient Profile:   77 Years Old Male Pulse rate:   92 / minute BP sitting:   116 / 73

## 2010-11-01 NOTE — Assessment & Plan Note (Signed)
Summary: 4 mo rov/mm pt rsc/njr also pt/njr   Vital Signs:  Patient profile:   77 year old male Weight:      230 pounds BMI:     30.45 Temp:     97.6 degrees F oral Pulse rate:   84 / minute Pulse rhythm:   regular BP sitting:   100 / 60  (left arm) Cuff size:   regular  Vitals Entered By: Raechel Ache, RN (October 14, 2009 11:07 AM) CC: 4 mo ROV   Primary Care Provider:  Gordy Savers  MD  CC:  4 mo ROV.  History of Present Illness: 77 year old patient seen today for follow-up of his chronic atrial fibrillation, chronic anticoagulation, and hypertension.  He has mild dyslipidemia.  No concerns or complaints today.  He is scheduled for a INR.  He denies any cardiopulmonary complaints  Problems Prior to Update: 1)  Cerumen Impaction, Right  (ICD-380.4) 2)  Atrial Fibrillation  (ICD-427.31) 3)  Anticoagulation Therapy  (ICD-V58.61) 4)  Palpitations  (ICD-785.1) 5)  Hypertension, Unspecified  (ICD-401.9) 6)  Hyperlipidemia  (ICD-272.4) 7)  Obesity  (ICD-278.00) 8)  Encounter For Therapeutic Drug Monitoring  (ICD-V58.83) 9)  Special Screening Malignant Neoplasm of Prostate  (ICD-V76.44) 10)  Family History Diabetes 1st Degree Relative  (ICD-V18.0) 11)  Family History of Colon Ca 1st Degree Relative <60  (ICD-V16.0) 12)  Diverticulitis, Hx of  (ICD-V12.79) 13)  Colonic Polyps, Hx of  (ICD-V12.72)  Medications Prior to Update: 1)  Warfarin Sodium 5 Mg  Tabs (Warfarin Sodium) .... One  Tablet Monday, Wednesday, Friday and One, and One Half Tablets 4 Times Weekly 2)  Diltiazem Hcl Er Beads 360 Mg Xr24h-Cap (Diltiazem Hcl Er Beads) .... Take One Capsule By Mouth Daily 3)  Viagra 50 Mg Tabs (Sildenafil Citrate) .... One Daily As Directed  Allergies: 1)  ! Codeine  Past History:  Past Medical History: Reviewed history from 01/20/2009 and no changes required. 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 Colonoscopy December 2005 Anal Fistula Repair Arthroscopic R Knee Tonsillectomy negative ETT 1991 Cataract extraction 2004 status post balloon valvuloplasty at [facility] in 1996 2-D echocardiogram November 2009  Review of Systems  The patient denies anorexia, fever, weight loss, weight gain, vision loss, decreased hearing, hoarseness, chest pain, syncope, dyspnea on exertion, peripheral edema, prolonged cough, headaches, hemoptysis, abdominal pain, melena, hematochezia, severe indigestion/heartburn, hematuria, incontinence, genital sores, muscle weakness, suspicious skin lesions, transient blindness, difficulty walking, depression, unusual weight change, abnormal bleeding, enlarged lymph nodes, angioedema, breast masses, and testicular masses.    Physical Exam  General:  overweight-appearing.  100/60overweight-appearing.   Head:  Normocephalic and atraumatic without obvious abnormalities. No apparent alopecia or balding. Eyes:  No corneal or conjunctival inflammation noted. EOMI. Perrla. Funduscopic exam benign, without hemorrhages, exudates or papilledema. Vision grossly normal. Mouth:  Oral mucosa and oropharynx without lesions or exudates.  Teeth in good repair. Neck:  No deformities, masses, or tenderness noted. Lungs:  Normal respiratory effort, chest expands symmetrically. Lungs are clear to  auscultation, no crackles or wheezes.  Heart:  controlled ventricular response- rate about 70 to 75 Abdomen:  obese soft and nontender Msk:  No deformity or scoliosis noted of thoracic or lumbar spine.   Pulses:  R and L carotid,radial,femoral,dorsalis pedis and posterior tibial pulses are full and equal bilaterally Extremities:  trace left pedal edema and trace right pedal edema.  trace left pedal edema.     Impression & Recommendations:  Problem # 1:  ATRIAL FIBRILLATION (ICD-427.31)  His updated medication list for this problem includes:    Warfarin Sodium 5 Mg Tabs (Warfarin sodium) ..... One  tablet monday, wednesday, friday and one, and one half tablets 4 times weekly    Diltiazem Hcl Er Beads 360 Mg Xr24h-cap (Diltiazem hcl er beads) .Marland Kitchen... Take one capsule by mouth daily  His updated medication list for this problem includes:    Warfarin Sodium 5 Mg Tabs (Warfarin sodium) ..... One  tablet monday, wednesday, friday and one, and one half tablets 4 times weekly    Diltiazem Hcl Er Beads 360 Mg Xr24h-cap (Diltiazem hcl er beads) .Marland Kitchen... Take one capsule by mouth daily  Orders: Prescription Created Electronically [phone])  Problem # 2:  ANTICOAGULATION THERAPY (ICD-V58.61)  Orders: Prescription Created Electronically [phone]) Protime ([id number])  Problem # 3:  HYPERTENSION, UNSPECIFIED (ICD-401.9)  His updated medication list for this problem includes:    Diltiazem Hcl Er Beads 360 Mg Xr24h-cap (Diltiazem hcl er beads) .Marland Kitchen... Take one capsule by mouth daily  His updated medication list for this problem includes:    Diltiazem Hcl Er Beads 360 Mg Xr24h-cap (Diltiazem hcl er beads) .Marland Kitchen... Take one capsule by mouth daily  Complete Medication List: 1)  Warfarin Sodium 5 Mg Tabs (Warfarin sodium) .... One  tablet monday, wednesday, friday and one, and one half tablets 4 times weekly 2)  Diltiazem Hcl Er Beads 360 Mg Xr24h-cap (Diltiazem hcl er beads) .... Take one capsule by mouth  daily 3)  Viagra 50 Mg Tabs (Sildenafil citrate) .... One daily as directed And and 10  Patient Instructions: 1)  Please schedule a follow-up appointment in 4 months. 2)  Limit your Sodium (Salt). 3)  It is important that you exercise regularly at least 20 minutes 5 times a week. If you develop chest pain, have severe difficulty breathing, or feel very tired , stop exercising immediately and seek medical attention. 4)  You need to lose weight. Consider a lower calorie diet and regular exercise.  Prescriptions: VIAGRA 50 MG TABS (SILDENAFIL CITRATE) one daily as directed  #6 x 6   Entered and Authorized by:   Gordy Savers  MD   Signed by:   Gordy Savers  MD on 10/14/2009   Method used:   Print then Give to Patient   RxID:   [id number] DILTIAZEM HCL ER BEADS 360 MG XR24H-CAP (DILTIAZEM HCL ER BEADS) Take one capsule by mouth daily  #90 x 3   Entered and Authorized by:   Gordy Savers  MD   Signed by:   Gordy Savers  MD on 10/14/2009   Method used:   Print then Give to Patient   RxID:   [id number] WARFARIN SODIUM 5 MG  TABS (WARFARIN SODIUM) one  tablet Monday, Wednesday, Friday and one, and one half tablets 4 times weekly  #127 x 4   Entered and Authorized by:   Gordy Savers  MD   Signed by:   Gordy Savers  MD on 10/14/2009   Method used:   Print  then Give to Patient   RxID:   [id number] VIAGRA 50 MG TABS (SILDENAFIL CITRATE) one daily as directed  #6 x 6   Entered and Authorized by:   Gordy Savers  MD   Signed by:   Gordy Savers  MD on 10/14/2009   Method used:   Electronically to        [facility]* (retail)       [address] [place], [place]  [address]       Ph: [phone]       Fax: [phone]   RxID:   [id number] DILTIAZEM HCL ER BEADS 360 MG XR24H-CAP (DILTIAZEM HCL ER BEADS) Take one capsule by mouth daily  #90 x 3   Entered and Authorized by:   Gordy Savers  MD   Signed by:   Gordy Savers  MD on 10/14/2009   Method used:   Electronically to        [facility]* (retail)       [address] [place], [place]  [address]       Ph: [phone]       Fax: [phone]   RxID:   [id number] WARFARIN SODIUM 5 MG  TABS (WARFARIN SODIUM) one  tablet Monday, Wednesday, Friday and one, and one half tablets 4 times weekly  #127 x 4   Entered and Authorized by:   Gordy Savers  MD   Signed by:   Gordy Savers  MD on 10/14/2009   Method used:   Electronically to        [facility]* (retail)       [address] [place], [place]  [address]       Ph: [phone]       Fax: [phone]   RxID:   [id number]    ANTICOAGULATION RECORD PREVIOUS REGIMEN & LAB RESULTS Anticoagulation Diagnosis:  v58.83,v58.61,427.31 on  04/16/2008 Previous INR Goal Range:  2.0-3.0 on  04/16/2008 Previous INR:  2.5 on  09/13/2009 Previous Coumadin Dose(mg):  5mg  on m,w,f 7.5mg  other  on  08/24/2008 Previous Regimen:  same on  09/13/2009 Previous Coagulation Comments:  Patient stated he couldn't come for a month. I gave him a card saying three weeks and also verbaly told him Dr. Kirtland Bouchard said 3 weeks. on  06/15/2009  NEW REGIMEN & LAB RESULTS Current INR: 2.3 Regimen: same  Repeat testing in: 1 month  Anticoagulation Visit Questionnaire Coumadin dose missed/changed:  No Abnormal Bleeding Symptoms:  No  Any diet changes including alcohol intake, vegetables or greens since the last visit:  No Any illnesses or hospitalizations since the last visit:  No Any signs of clotting since the last visit (including chest discomfort, dizziness, shortness of breath, arm tingling, slurred speech, swelling or redness in leg):  No  MEDICATIONS WARFARIN SODIUM 5 MG  TABS (WARFARIN SODIUM) one  tablet Monday, Wednesday, Friday and one, and one half tablets 4 times weekly DILTIAZEM HCL  ER BEADS 360 MG XR24H-CAP (DILTIAZEM HCL ER BEADS) Take one capsule by mouth daily VIAGRA 50 MG TABS (SILDENAFIL CITRATE) one daily as directed    Laboratory Results   Blood Tests     PT: 18.4 s   (Normal Range:  10.6-13.4)  INR: 2.3   (Normal Range: 0.88-1.12   Therap INR: 2.0-3.5) Comments: Rita Ohara  October 14, 2009 11:41 AM

## 2010-11-03 NOTE — Assessment & Plan Note (Signed)
Summary: 6 month fup//ccm also protime/njr   Vital Signs:  Patient profile:   77 year old male Weight:      240 pounds Temp:     97.5 degrees F oral BP sitting:   118 / 80  (right arm) Cuff size:   regular  Vitals Entered By: Duard Brady LPN (October 10, 2010 7:58 AM) CC: 6 mos rov - doing well Is Patient Diabetic? No   Primary Care Provider:  Gordy Savers  MD  CC:  6 mos rov - doing well.  History of Present Illness: 89  is in today for medical follow-up.  medical problems include chronic fibrillation.  He is on chronic anticoagulation therapy.  He has treated hypertension and has done quite well.  He has a history of exogenous obesity.  Weight has been stable at about 230 at his gym.  He does get to his health club 3 to 4 times per week.  He does have a history of diverticulosis and also colonic polyps.  He has no concerns or complaints today.  He does have a monthly following of his anticoagulation therapy.  Allergies: 1)  ! Codeine  Past History:  Past Medical History: Reviewed history from 04/11/2010 and no changes required. ATRIAL FIBRILLATION (ICD-427.31) ANTICOAGULATION THERAPY (ICD-V58.61)  PALPITATIONS (ICD-785.1) LEFT VENTRICULAR FUNCTION, DECREASED (ICD-429.2) HYPERTENSION, UNSPECIFIED (ICD-401.9) HYPERLIPIDEMIA (ICD-272.4) OBESITY (ICD-278.00) ENCOUNTER FOR THERAPEUTIC DRUG MONITORING (ICD-V58.83) SPECIAL SCREENING MALIGNANT NEOPLASM OF PROSTATE (ICD-V76.44) FAMILY HISTORY DIABETES 1ST DEGREE RELATIVE (ICD-V18.0) FAMILY HISTORY OF COLON CA 1ST DEGREE RELATIVE <60 (ICD-V16.0) DIVERTICULITIS, HX OF (ICD-V12.79) COLONIC POLYPS, HX OF (ICD-V12.72)   COLONIC POLYPS, HX OF (ICD-V12.72)  Past Surgical History: Reviewed history from 04/11/2010 and no changes required. Colonoscopy December 2005 Anal Fistula Repair Arthroscopic R Knee Tonsillectomy negative ETT 1991 Cataract extraction 2004 status post balloon valvuloplasty at [facility] in 1996 2-D  echocardiogram November 2009, 2011  Social History: Reviewed history from 03/17/2009 and no changes required. Retired Never Smoked Alcohol use-no Married Regular exercise-yes:  [employer] 4 times/week  Review of Systems  The patient denies anorexia, fever, weight loss, weight gain, vision loss, decreased hearing, hoarseness, chest pain, syncope, dyspnea on exertion, peripheral edema, prolonged cough, headaches, hemoptysis, abdominal pain, melena, hematochezia, severe indigestion/heartburn, hematuria, incontinence, genital sores, muscle weakness, suspicious skin lesions, transient blindness, difficulty walking, depression, unusual weight change, abnormal bleeding, enlarged lymph nodes, angioedema, breast masses, and testicular masses.    Physical Exam  General:  overweight-appearing.  100/64 Head:  Normocephalic and atraumatic without obvious abnormalities. No apparent alopecia or balding. Eyes:  No corneal or conjunctival inflammation noted. EOMI. Perrla. Funduscopic exam benign, without hemorrhages, exudates or papilledema. Vision grossly normal. Mouth:  Oral mucosa and oropharynx without lesions or exudates.  Teeth in good repair. Neck:  No deformities, masses, or tenderness noted. Chest Wall:  No deformities, masses, tenderness or gynecomastia noted. Lungs:  Normal respiratory effort, chest expands symmetrically. Lungs are clear to auscultation, no crackles or wheezes. Heart:  irregular rhythm, with a controlled ventricular response Abdomen:  obese soft and nontender.  No organomegaly. Pulses:  R and L carotid,radial,femoral,dorsalis pedis and posterior tibial pulses are full and equal bilaterally Extremities:  trace left pedal edema and trace right pedal edema.   Skin:  Intact without suspicious lesions or rashes Cervical Nodes:  No lymphadenopathy noted Psych:  Cognition and judgment appear intact. Alert and cooperative with normal attention span and concentration. No apparent delusions,  illusions, hallucinations   Impression & Recommendations:  Problem # 1:  ATRIAL FIBRILLATION (ICD-427.31)  His updated medication list for this problem includes:    Warfarin Sodium 5 Mg Tabs (Warfarin sodium) ..... One  tablet monday, wednesday, friday and one, and one half tablets 4 times weekly    Diltiazem Hcl Er Beads 360 Mg Xr24h-cap (Diltiazem hcl er beads) .Marland Kitchen... Take one capsule by mouth daily  Orders: Protime ([id number])  Problem # 2:  ANTICOAGULATION THERAPY (ICD-V58.61)  Orders: Protime ([id number])  Problem # 3:  HYPERTENSION, UNSPECIFIED (ICD-401.9)  His updated medication list for this problem includes:    Diltiazem Hcl Er Beads 360 Mg Xr24h-cap (Diltiazem hcl er beads) .Marland Kitchen... Take one capsule by mouth daily  Problem # 4:  OBESITY (ICD-278.00)  Complete Medication List: 1)  Warfarin Sodium 5 Mg Tabs (Warfarin sodium) .... One  tablet monday, wednesday, friday and one, and one half tablets 4 times weekly 2)  Diltiazem Hcl Er Beads 360 Mg Xr24h-cap (Diltiazem hcl er beads) .... Take one capsule by mouth daily 3)  Viagra 50 Mg Tabs (Sildenafil citrate) .... One daily as directed  Patient Instructions: 1)  Please schedule a follow-up appointment in 6 months for CPX  2)  Advised not to eat any food or drink any liquids after 10 PM the night before your procedure. 3)  Limit your Sodium (Salt). 4)  It is important that you exercise regularly at least 20 minutes 5 times a week. If you develop chest pain, have severe difficulty breathing, or feel very tired , stop exercising immediately and seek medical attention. 5)  You need to lose weight. Consider a lower calorie diet and regular exercise.  Prescriptions: VIAGRA 50 MG TABS (SILDENAFIL CITRATE) one daily as directed  #6 x 6   Entered and Authorized by:   Gordy Savers  MD   Signed by:   Gordy Savers  MD on 10/10/2010   Method used:   Electronically to        [facility]* (retail)       [address]  [place], [place]  [address]       Ph: [phone]       Fax: [phone]   RxID:   [id number] DILTIAZEM HCL ER BEADS 360 MG XR24H-CAP (DILTIAZEM HCL ER BEADS) Take one capsule by mouth daily  #90 x 3   Entered and Authorized by:   Gordy Savers  MD   Signed by:   Gordy Savers  MD on 10/10/2010   Method used:   Electronically to        [facility]* (retail)       [address] [place], [place]  [address]       Ph: [phone]       Fax: [phone]   RxID:   [id number] WARFARIN SODIUM 5 MG  TABS (WARFARIN SODIUM) one  tablet Monday, Wednesday, Friday and one, and one half tablets 4 times weekly  #127 x 4   Entered and Authorized by:   Gordy Savers  MD   Signed by:   Gordy Savers  MD on 10/10/2010   Method used:   Electronically to        [facility]* (retail)       [address] [facility]  [place], [place]  [address]       Ph: [phone]       Fax: [phone]   RxID:   [id number]    Orders Added: 1)  Est. Patient Level IV [[address]] 2)  Protime [[id number]]  Appended Document: 6 month fup//ccm also protime/njr   ANTICOAGULATION RECORD PREVIOUS REGIMEN & LAB RESULTS Anticoagulation Diagnosis:  v58.83,v58.61,427.31 on  04/16/2008 Previous INR Goal Range:  2.0-3.0 on  04/16/2008 Previous INR:  2.4 on  09/06/2010 Previous Coumadin Dose(mg):  5mg  on m,w,f 7.5mg  other  on  08/24/2008 Previous Regimen:  same on  09/06/2010 Previous Coagulation Comments:  Patient stated he couldn't come for a month. I gave him a card saying three weeks and also verbaly told him Dr. Kirtland Bouchard said 3 weeks. on  06/15/2009  NEW REGIMEN & LAB RESULTS Current INR: 2.2 Regimen: 2.2  Repeat testing in: 4 weeks  Anticoagulation Visit Questionnaire Coumadin dose missed/changed:  No Abnormal Bleeding Symptoms:  No  Any diet changes including alcohol intake, vegetables or greens since  the last visit:  No Any illnesses or hospitalizations since the last visit:  No Any signs of clotting since the last visit (including chest discomfort, dizziness, shortness of breath, arm tingling, slurred speech, swelling or redness in leg):  No  MEDICATIONS WARFARIN SODIUM 5 MG  TABS (WARFARIN SODIUM) one  tablet Monday, Wednesday, Friday and one, and one half tablets 4 times weekly DILTIAZEM HCL ER BEADS 360 MG XR24H-CAP (DILTIAZEM HCL ER BEADS) Take one capsule by mouth daily VIAGRA 50 MG TABS (SILDENAFIL CITRATE) one daily as directed    Laboratory Results   Blood Tests      INR: 2.2   (Normal Range: 0.88-1.12   Therap INR: 2.0-3.5) Comments: Rita Ohara  October 10, 2010 11:15 AM

## 2010-11-07 DIAGNOSIS — I4821 Permanent atrial fibrillation: Secondary | ICD-10-CM | POA: Insufficient documentation

## 2010-11-07 DIAGNOSIS — I4891 Unspecified atrial fibrillation: Secondary | ICD-10-CM | POA: Insufficient documentation

## 2010-11-07 DIAGNOSIS — I482 Chronic atrial fibrillation, unspecified: Secondary | ICD-10-CM | POA: Insufficient documentation

## 2010-11-07 NOTE — Progress Notes (Signed)
Addended by: Kyung Rudd on: 11/07/2010 10:14 AM   Modules accepted: Orders

## 2010-11-10 ENCOUNTER — Other Ambulatory Visit (INDEPENDENT_AMBULATORY_CARE_PROVIDER_SITE_OTHER): Payer: BC Managed Care – PPO | Admitting: Internal Medicine

## 2010-11-10 DIAGNOSIS — I4891 Unspecified atrial fibrillation: Secondary | ICD-10-CM

## 2010-11-10 LAB — POCT INR: INR: 2.6

## 2010-11-16 NOTE — Patient Instructions (Signed)
°  Latest dosing instructions   Total Sun Mon Tue Wed Thu Fri Sat   47.5 7.5 mg 5 mg 7.5 mg 7.5 mg 5 mg 7.5 mg 7.5 mg    (5 mg1.5) (5 mg1) (5 mg1.5) (5 mg1.5) (5 mg1) (5 mg1.5) (5 mg1.5)

## 2010-12-08 ENCOUNTER — Other Ambulatory Visit: Payer: Medicare Other | Admitting: Internal Medicine

## 2010-12-08 DIAGNOSIS — I4891 Unspecified atrial fibrillation: Secondary | ICD-10-CM

## 2010-12-08 LAB — POCT INR: INR: 2.7

## 2011-01-09 ENCOUNTER — Ambulatory Visit (INDEPENDENT_AMBULATORY_CARE_PROVIDER_SITE_OTHER): Payer: Medicare Other | Admitting: Internal Medicine

## 2011-01-09 DIAGNOSIS — I4891 Unspecified atrial fibrillation: Secondary | ICD-10-CM

## 2011-01-09 LAB — POCT INR: INR: 2.5

## 2011-01-09 NOTE — Patient Instructions (Signed)
Same dose, 5 mg on mondays and thursdays, 7.5 mg on other days, check in 6 weeks

## 2011-02-09 ENCOUNTER — Ambulatory Visit (INDEPENDENT_AMBULATORY_CARE_PROVIDER_SITE_OTHER): Payer: Medicare Other | Admitting: Internal Medicine

## 2011-02-09 DIAGNOSIS — I4891 Unspecified atrial fibrillation: Secondary | ICD-10-CM

## 2011-02-09 LAB — POCT INR: INR: 3.1

## 2011-02-09 NOTE — Patient Instructions (Signed)
Same dose 

## 2011-02-14 NOTE — Assessment & Plan Note (Signed)
[facility]                            CARDIOLOGY OFFICE NOTE   KAHLEL, PEAKE                      MRN:          [id number]  DATE:05/08/2008                            DOB:          1928/08/24    I was asked by Gordy Savers, MD to evaluate Glen [name] with  new onset atrial fib.   HISTORY OF PRESENT ILLNESS:  He is 77 years of age, white male, married,  father of 4, who was found to be in atrial fib a couple of months ago.  He has been placed on warfarin and diltiazem.   He had a 2-D echocardiogram at [facility], which  showed an EF of 40%-45%, moderate left ventricular hypertrophy, (though  there was no history of hypertension), moderate left atrial dilatation  with a diameter of 4.7 cm.  No evidence of significant valvular heart  disease.  Normal right-sided structures and function.  Of interest was  his calculated EF of 56% by computer.  His left ventricular chamber size  was not enlarged.   He is totally asymptomatic except some occasional palpitations when he  gets up during the night.  He has been walking about a mile to mild and  a half since advised by Dr. Amador Cunas.  He really does not have any  major dyspnea on exertion or chest discomfort.  He denies any orthopnea,  PND, or peripheral edema.  He does have some fatigue after he walks.  He  is fairly deconditioned, which he jokes about today.   PAST MEDICAL HISTORY:  He is intolerant of codeine.   His current meds are diltiazem extended release 240 mg a day and  warfarin as directed.  His protimes are being [facility] at [facility].   He does not smoke, drink, or use any recreational products.   Past surgical history is negative.   Family history is really noncontributory.   SOCIAL HISTORY:  He is retired since 1995.  He lives in [place].   REVIEW OF SYSTEMS:  Other than HPI, all 12 points of care are questioned  and are negative.  He does  have some reflux; however.   Exam, delightful gentleman in no acute distress.  He does not list his  height, but he is 240 pounds.  His blood pressure is 127/76, pulse is 98  and irregular.  EKG confirms atrial fib with no acute changes.  HEENT,  he has facial erythema.  PERRLA.  Extraocular movements intact.  Sclerae  are clear.  Facial strength is normal.  Dentition is satisfactory.  Neck  is supple.  Carotids upstrokes are equal bilaterally without bruits.  No  JVD.  Thyroid is not enlarged.  Lungs are clear.  Heart reveals a poorly  appreciated PMI.  He has a variable S1 and S2.  There is no murmur,  gallop, or rub.  Abdominal exam is soft.  Good bowel sounds.  No midline  bruit.  No hepatomegaly.  Extremities are without cyanosis, clubbing, or  edema.  Pulses are present.  Neuro exam is intact.  Skin is  unremarkable.  Laboratory data from Dr. Vernon Prey office including a TSH and  chemistry profile reviewed and are normal.   ASSESSMENT AND PLAN:  1. New-onset atrial fibrillation.  He is fairly asymptomatic.  2. Echocardiogram showing a discrepancy in left ventricular ejection      fraction by the interpreter versus the computer.  His reduced LV      function could be due to atrial fib with a poorly-controlled      ventricular rate at that time.  3. Moderate left ventricular hypertrophy on his echo though he does      not have a history of hypertension.   PLAN:  1. Increase rate control with diltiazem 360 mg per day.  2. Continue warfarin.  3. See me back in about 4 weeks.  At that time, if he still      asymptomatic and his rate is well controlled, we will continue with      the conservative approach.  We talked about cardioversion and the      pros and cons of that.  He did not seem too excited about that.     Thomas C. Daleen Squibb, MD, [facility]  Electronically Signed    TCW/MedQ  DD: 05/08/2008  DT: 05/09/2008  Job #: [id number]   cc:   Gordy Savers, MD

## 2011-02-14 NOTE — Assessment & Plan Note (Signed)
[facility]                            CARDIOLOGY OFFICE NOTE   WHIT, BRUNI                      MRN:          [id number]  DATE:08/20/2008                            DOB:          Jan 24, 1928    [name] returns today for followup of his atrial fib.  We are  treating him for rate control and anticoagulation.   His preliminary echo today shows an improvement in LV function with no  LV chamber enlargement.  I do not have a final report or a calculated  EF.  He does have some mild left atrial enlargement, and his left-sided  pressure seems to be up a little.   He is totally asymptomatic.  [place]ound[place].  He walks on a regular basis.  He has also  lost about 20 some pounds since this all came to recognition.   PHYSICAL EXAMINATION:  VITAL SIGNS:  His blood pressure today is 104/68,  his pulse is 80 and irregular.  His weight is 232.  HEENT:  Unchanged.  LUNGS:  Clear to auscultation and percussion.  HEART:  A regular rate and rhythm with no gallop.  PMI is poorly  appreciated.  ABDOMEN:  Soft.  Good bowel sounds.  No midline bruit.  No hepatomegaly.  EXTREMITIES:  There were no cyanosis, clubbing, or edema.  Pulses are  intact.  NEUROLOGIC:  Intact.   [name] is doing well.  He is totally asymptomatic and we are aiming  for rate control and anticoagulation.  His LV function appears to be  improved now that his rate has been controlled.  I will plan on seeing  him back in 6 months.     Thomas C. Daleen Squibb, MD, [facility]  Electronically Signed    TCW/MedQ  DD: 08/20/2008  DT: 08/20/2008  Job #: [id number]

## 2011-02-14 NOTE — Assessment & Plan Note (Signed)
[facility]                            CARDIOLOGY OFFICE NOTE   ADAMA, FERBER                      MRN:          [id number]  DATE:05/26/2008                            DOB:          03/15/1928    Mr. Sole returns today for further management of atrial fib.  He  remains on Coumadin, and I increased his diltiazem from 240-360 on last  visit.   He takes the diltiazem in the morning and takes a walk.  He says he is  really tired when he gets back.  He denies any dyspnea on exertion,  tachy palpitations, presyncope, or syncope.   He has had no significant peripheral edema.   His blood pressure today is 117/76, his heart rate is down to about 70-  80 which is a significant improvement than last time.  His weight is 244  and stable.  HEENT is unchanged.  Neck shows no JVD.  Carotids are full.  Lungs are clear.  Heart reveals an irregular rate and rhythm, well-  controlled rate, however.  Abdomen exam is soft.  Extremities reveal  trace edema.  Pulses are intact.  Neuro exam intact.   ASSESSMENT AND PLAN:  Mr. Brendle is doing well.  I have asked to take  his diltiazem at night rather than the morning to avoid any sort of  fatigue.  He may have a little bit of edema, which I told him to ignore  and this is really bothering.  We will set him up to come back in  November at which time we will do a 2D echo to see if his LV function is  improved.  We will continue with rate control and anticoagulation.     Thomas C. Daleen Squibb, MD, [facility]  Electronically Signed    TCW/MedQ  DD: 05/26/2008  DT: 05/27/2008  Job #: [id number]   cc:   Gordy Savers, MD

## 2011-02-17 NOTE — Assessment & Plan Note (Signed)
[facility] OFFICE NOTE   TRUST, LEH                      MRN:          [id number]  DATE:09/06/2006                            DOB:          1927/10/27    A 77 year old gentleman who is seen today for a wellness exam.  He has a  history of mild dyslipidemia, exogenous obesity, chronic polyps and  diverticulosis.  He has some DJD, and has had some arthroscopic right  knee surgery.  He has also had anal fistula repair, remote tonsillectomy  and cataract surgery.   ALLERGIES:  HE HAS A CODEINE ALLERGY.   He takes no prescription medications.   REVIEW OF SYSTEMS:  Negative.  Did have a negative ETT in 1991.  Last  colonoscopy was in December of 2005.   FAMILY HISTORY:  Positive for colon cancer, coronary artery disease,  valvular heart disease.   Again, reveals an overweight male in no acute distress.  Weight was down  12 pounds.  Blood pressure 126/76.  SKIN:  Negative.  Fundi, ear, nose, and throat clear.  NECK:  No bruits.  CHEST:  Clear.  CARDIOVASCULAR:  Normal heart sounds, no murmurs.  ABDOMEN:  Benign.  External genitalia normal.  RECTAL EXAM:  Prostate benign and small for age.  EXTREMITIES:  Revealed full peripheral pulses.  NEURO:  Intact.   IMPRESSION:  Chronic polyps, diverticulosis, exogenous obesity, mild  dyslipidemia.   DISPOSITION:  Weight loss, exercise, all encouraged.  Return in 1 year  for followup.  Laboratory update reviewed.     Gordy Savers, MD  Electronically Signed    PFK/MedQ  DD: 09/06/2006  DT: 09/06/2006  Job #: [id number]

## 2011-03-14 ENCOUNTER — Ambulatory Visit: Payer: Medicare Other

## 2011-03-14 DIAGNOSIS — I4891 Unspecified atrial fibrillation: Secondary | ICD-10-CM

## 2011-03-14 LAB — POCT INR: INR: 2.4

## 2011-03-14 NOTE — Patient Instructions (Signed)
Same dose 

## 2011-04-07 ENCOUNTER — Encounter: Payer: Self-pay | Admitting: Cardiology

## 2011-04-12 ENCOUNTER — Encounter: Payer: Self-pay | Admitting: Internal Medicine

## 2011-04-14 ENCOUNTER — Other Ambulatory Visit: Payer: Self-pay

## 2011-04-14 ENCOUNTER — Encounter: Payer: Self-pay | Admitting: Internal Medicine

## 2011-04-14 ENCOUNTER — Ambulatory Visit (INDEPENDENT_AMBULATORY_CARE_PROVIDER_SITE_OTHER): Payer: Medicare Other | Admitting: Internal Medicine

## 2011-04-14 DIAGNOSIS — I10 Essential (primary) hypertension: Secondary | ICD-10-CM

## 2011-04-14 DIAGNOSIS — Z0000 Encounter for general adult medical examination without abnormal findings: Secondary | ICD-10-CM

## 2011-04-14 DIAGNOSIS — E785 Hyperlipidemia, unspecified: Secondary | ICD-10-CM

## 2011-04-14 DIAGNOSIS — Z7901 Long term (current) use of anticoagulants: Secondary | ICD-10-CM

## 2011-04-14 DIAGNOSIS — I4891 Unspecified atrial fibrillation: Secondary | ICD-10-CM

## 2011-04-14 LAB — BASIC METABOLIC PANEL
BUN: 18 mg/dL (ref 6–23)
CO2: 29 mEq/L (ref 19–32)
Calcium: 9.3 mg/dL (ref 8.4–10.5)
Chloride: 107 mEq/L (ref 96–112)
Creatinine, Ser: 1.4 mg/dL (ref 0.4–1.5)
GFR: 53.89 mL/min — ABNORMAL LOW (ref >60.00)
Glucose, Bld: 110 mg/dL — ABNORMAL HIGH (ref 70–99)
Potassium: 5.8 mEq/L — ABNORMAL HIGH (ref 3.5–5.1)
Sodium: 141 mEq/L (ref 135–145)

## 2011-04-14 LAB — CBC WITH DIFFERENTIAL/PLATELET
Basophils Absolute: 0 10*3/uL (ref 0.0–0.1)
Basophils Relative: 0.4 % (ref 0.0–3.0)
Eosinophils Absolute: 0.1 10*3/uL (ref 0.0–0.7)
Eosinophils Relative: 1.1 % (ref 0.0–5.0)
HCT: 51.8 % (ref 39.0–52.0)
Hemoglobin: 17.7 g/dL — ABNORMAL HIGH (ref 13.0–17.0)
Lymphocytes Relative: 26.8 % (ref 12.0–46.0)
Lymphs Abs: 2.2 10*3/uL (ref 0.7–4.0)
MCHC: 34.2 g/dL (ref 30.0–36.0)
MCV: 95.2 fl (ref 78.0–100.0)
Monocytes Absolute: 0.6 10*3/uL (ref 0.1–1.0)
Monocytes Relative: 6.9 % (ref 3.0–12.0)
Neutro Abs: 5.3 10*3/uL (ref 1.4–7.7)
Neutrophils Relative %: 64.8 % (ref 43.0–77.0)
Platelets: 186 10*3/uL (ref 150.0–400.0)
RBC: 5.44 Mil/uL (ref 4.22–5.81)
RDW: 13.1 % (ref 11.5–14.6)
WBC: 8.1 10*3/uL (ref 4.5–10.5)

## 2011-04-14 LAB — HEPATIC FUNCTION PANEL
ALT: 19 U/L (ref 0–53)
AST: 20 U/L (ref 0–37)
Albumin: 4.5 g/dL (ref 3.5–5.2)
Alkaline Phosphatase: 84 U/L (ref 39–117)
Bilirubin, Direct: 0.1 mg/dL (ref 0.0–0.3)
Total Bilirubin: 0.6 mg/dL (ref 0.3–1.2)
Total Protein: 7.2 g/dL (ref 6.0–8.3)

## 2011-04-14 LAB — LIPID PANEL
Cholesterol: 167 mg/dL (ref 0–200)
HDL: 38.3 mg/dL — ABNORMAL LOW (ref >39.00)
LDL Cholesterol: 98 mg/dL (ref 0–99)
Total CHOL/HDL Ratio: 4
Triglycerides: 156 mg/dL — ABNORMAL HIGH (ref 0.0–149.0)
VLDL: 31.2 mg/dL (ref 0.0–40.0)

## 2011-04-14 LAB — TSH: TSH: 2.21 u[IU]/mL (ref 0.35–5.50)

## 2011-04-14 LAB — POCT INR: INR: 2.3

## 2011-04-14 MED ORDER — DILTIAZEM HCL ER BEADS 360 MG PO CP24: 360.0000 mg | ORAL_CAPSULE | Freq: Every day | ORAL | Status: DC

## 2011-04-14 MED ORDER — WARFARIN SODIUM 5 MG PO TABS: 5.0000 mg | ORAL_TABLET | Freq: Every day | ORAL | Status: DC

## 2011-04-14 NOTE — Progress Notes (Signed)
Addended by: Bonnye Fava on: 04/14/2011 09:36 AM   Modules accepted: Orders

## 2011-04-14 NOTE — Patient Instructions (Signed)
Limit your sodium (Salt) intake  You need to lose weight.  Consider a lower calorie diet and regular exercise.    It is important that you exercise regularly, at least 20 minutes 3 to 4 times per week.  If you develop chest pain or shortness of breath seek  medical attention.   Monthly prothrombin times  Return in 6 months for follow-up

## 2011-04-14 NOTE — Progress Notes (Signed)
Subjective:    Patient ID: Glen [name], male    DOB: 03-06-1928, 77 y.o.   MRN: [id number]  HPI  CC: cpx - donig well.  History of Present Illness:   77 year old patient who is seen today for a comprehensive evaluation. He is followed closely cardiology due to chronic atrial fibrillation. He is on warfarin anticoagulation. He has a history of hypertension, mild, dyslipidemia, and exogenous obesity. He has a history of colonic polyps and a family history of colon cancer. His cardiopulmonary status has been stable. There has been some weight gain since his last visit here.   Here for Medicare AWV:   1. Risk factors based on Past M, S, F history: risk factors include a family history of prostate and colon cancer.  2. Physical Activities: fairly sedentary, but no exercise restrictions  3. Depression/mood: no history of depression or mood disorder  4. Hearing: no hearing deficits  5. ADL's: completely independent in all aspects of daily living  6. Fall Risk: low  7. Home Safety: no problems identified  8. Height, weight, &visual acuity: unremarkable except for a 10-pound weight gain over the past 6 months  9. Counseling: heart healthy diet calorie restriction and more exercise. All encouraged  10. Labs ordered based on risk factors: laboratory profile, including lipid panel will be reviewed. PSA test in discuss and it was elected to defer  11. Referral Coordination- will consider a colonoscopy follow-up in 6 months  12. Care Plan- heart healthy diet restricted salt and weight loss. All encouraged  13. Cognitive Assessment- alert and oriented, with normal affect. Several family members have early dementia   Preventive Screening-Counseling & Management  Alcohol-Tobacco  Smoking Status: never   Allergies:  1) ! Codeine  Past History:   Past Medical History:  ATRIAL FIBRILLATION (ICD-427.31)  ANTICOAGULATION THERAPY (ICD-V58.61)  PALPITATIONS (ICD-785.1)  LEFT VENTRICULAR FUNCTION,  DECREASED (ICD-429.2)  HYPERTENSION, UNSPECIFIED (ICD-401.9)  HYPERLIPIDEMIA (ICD-272.4)  OBESITY (ICD-278.00)  ENCOUNTER FOR THERAPEUTIC DRUG MONITORING (ICD-V58.83)  SPECIAL SCREENING MALIGNANT NEOPLASM OF PROSTATE (ICD-V76.44)  FAMILY HISTORY DIABETES 1ST DEGREE RELATIVE (ICD-V18.0)  FAMILY HISTORY OF COLON CA 1ST DEGREE RELATIVE <60 (ICD-V16.0)  DIVERTICULITIS, HX OF (ICD-V12.79)  COLONIC POLYPS, HX OF (ICD-V12.72)  COLONIC POLYPS, HX OF (ICD-V12.72)   Past Surgical History:  Colonoscopy December 2005  Anal Fistula Repair  Arthroscopic R Knee  Tonsillectomy  negative ETT 1991  Cataract extraction 2004  status post balloon valvuloplasty at [facility] in 1996  2-D echocardiogram November 2009, 2011   Family History:  Reviewed history from 03/13/2008 and no changes required.  Family History of Colon CA 1st degree relative <60  Family History Diabetes 1st degree relative  Family History of Prostate CA 1st degree relative <50  Family History of Cardiovascular disorder  father died age 51 of a myocardial infarction  mother died age 31, colon cancer  Two brothers 4 sisters  Positive for coronary disease. Two siblings, status post stenting to siblings with heart disease. One. Status post pacemaker insertion. One brother with prostate cancer, status post RT   Social History:  Reviewed history from 03/17/2009 and no changes required.  Retired  Never Smoked  Alcohol use-no  Married  Regular exercise-yes: [employer] 4 times/week   Wt Readings from Last 3 Encounters:  04/14/11 245 lb (111.131 kg)  10/10/10 240 lb (108.863 kg)  04/11/10 240 lb (108.863 kg)    Past Medical History  Diagnosis Date  . Arrhythmia   . Drug therapy     Anticoagulation  Therapy  . Palpitation   . Decreased left ventricular function   . Hypertension   . Hyperlipidemia   . Obesity   . Neoplasm of prostate     special screening malignant   . Diverticulitis   . History of colonic polyps   . FHx: colon  cancer   . Family history of diabetes insipidus        Review of Systems  Constitutional: Negative for fever, chills, activity change, appetite change and fatigue.  HENT: Negative for hearing loss, ear pain, congestion, rhinorrhea, sneezing, mouth sores, trouble swallowing, neck pain, neck stiffness, dental problem, voice change, sinus pressure and tinnitus.   Eyes: Negative for photophobia, pain, redness and visual disturbance.  Respiratory: Negative for apnea, cough, choking, chest tightness, shortness of breath and wheezing.   Cardiovascular: Negative for chest pain, palpitations and leg swelling.  Gastrointestinal: Negative for nausea, vomiting, abdominal pain, diarrhea, constipation, blood in stool, abdominal distention, anal bleeding and rectal pain.  Genitourinary: Negative for dysuria, urgency, frequency, hematuria, flank pain, decreased urine volume, discharge, penile swelling, scrotal swelling, difficulty urinating, genital sores and testicular pain.  Musculoskeletal: Negative for myalgias, back pain, joint swelling, arthralgias and gait problem.  Skin: Negative for color change, rash and wound.  Neurological: Negative for dizziness, tremors, seizures, syncope, facial asymmetry, speech difficulty, weakness, light-headedness, numbness and headaches.  Hematological: Negative for adenopathy. Does not bruise/bleed easily.  Psychiatric/Behavioral: Negative for suicidal ideas, hallucinations, behavioral problems, confusion, sleep disturbance, self-injury, dysphoric mood, decreased concentration and agitation. The patient is not nervous/anxious.        Objective:   Physical Exam  Constitutional: He appears well-developed and well-nourished.  HENT:  Head: Normocephalic and atraumatic.  Right Ear: External ear normal.  Left Ear: External ear normal.  Nose: Nose normal.  Mouth/Throat: Oropharynx is clear and moist.  Eyes: Conjunctivae and EOM are normal. Pupils are equal, round, and  reactive to light. No scleral icterus.  Neck: Normal range of motion. Neck supple. No JVD present. No thyromegaly present.  Cardiovascular: Normal rate, normal heart sounds and intact distal pulses.  Exam reveals no gallop and no friction rub.   No murmur heard.      Irregular rhythm with a controlled ventricular response  Pulmonary/Chest: Effort normal and breath sounds normal. He exhibits no tenderness.  Abdominal: Soft. Bowel sounds are normal. He exhibits no distension and no mass. There is no tenderness.  Genitourinary: Rectum normal, prostate normal and penis normal. Guaiac negative stool. No penile tenderness.  Musculoskeletal: Normal range of motion. He exhibits edema. He exhibits no tenderness.       +1 lower extremity edema.  Lymphadenopathy:    He has no cervical adenopathy.  Neurological: He is alert. He has normal reflexes. No cranial nerve deficit. Coordination normal.  Skin: Skin is warm and dry. No rash noted.  Psychiatric: He has a normal mood and affect. His behavior is normal.          Assessment & Plan:   Annual health assessment Hypertension well controlled Chronic atrial fibrillation Chronic Coumadin anticoagulation Exogenous obesity  Laboratory data including prothrombin time will be reviewed Followup cardiology  Return here 6 months

## 2011-04-14 NOTE — Progress Notes (Signed)
Addended by: Bonnye Fava on: 04/14/2011 09:43 AM   Modules accepted: Orders

## 2011-04-14 NOTE — Telephone Encounter (Signed)
Re ordered to sams per pt request

## 2011-05-25 ENCOUNTER — Ambulatory Visit (INDEPENDENT_AMBULATORY_CARE_PROVIDER_SITE_OTHER): Payer: Medicare Other

## 2011-05-25 DIAGNOSIS — I4891 Unspecified atrial fibrillation: Secondary | ICD-10-CM

## 2011-05-25 LAB — POCT INR: INR: 2.4

## 2011-05-25 NOTE — Patient Instructions (Signed)
Same dose,5 mg on mondays and thursdays 7.5 mg on other days,check in 4 weeks

## 2011-06-14 ENCOUNTER — Ambulatory Visit (INDEPENDENT_AMBULATORY_CARE_PROVIDER_SITE_OTHER): Payer: Medicare Other | Admitting: Cardiology

## 2011-06-14 ENCOUNTER — Encounter: Payer: Self-pay | Admitting: Cardiology

## 2011-06-14 VITALS — BP 119/77 | HR 96 | Resp 14 | Ht 73.0 in | Wt 243.0 lb

## 2011-06-14 DIAGNOSIS — Z7901 Long term (current) use of anticoagulants: Secondary | ICD-10-CM

## 2011-06-14 DIAGNOSIS — I4891 Unspecified atrial fibrillation: Secondary | ICD-10-CM

## 2011-06-14 NOTE — Progress Notes (Signed)
HPI [name] is for evaluation and management of his chronic atrial fib, history of valvuloplasty of the mitral valve, anticoagulation. Other than fatigue, he offers no complaints. He is having no bleeding issues with Coumadin. He seems to be compliant.  EKG shows chronic atrial fib with a heart rate of 90.  Echocardiogram was stable last year.  Past Medical History  Diagnosis Date  . Arrhythmia   . Drug therapy     Anticoagulation Therapy  . Palpitation   . Decreased left ventricular function   . Hypertension   . Hyperlipidemia   . Obesity   . Neoplasm of prostate     special screening malignant   . Diverticulitis   . History of colonic polyps   . FHx: colon cancer   . Family history of diabetes insipidus     Past Surgical History  Procedure Date  . Colonoscopy December 2005  . Anal fistula repair   . Knee arthroscopy     Right  . Tonsillectomy   . Ett 1991    Negative  . Cataract extraction 2004  . Cardiac valve surgery 1996    status post balloon val[facility]uloplasty at [facility]  . 2-d echocardiogram November 2009, 2011    Family History  Problem Relation Age of Onset  . Colon cancer    . Diabetes    . Prostate cancer Brother   . Heart disease    . Heart attack Father   . Coronary artery disease      Two siblings, status post stenting to siblings with heart disease. One. Status post pacemaker insertion.    History   Social History  . Marital Status: Married    Spouse Name: N/A    Number of Children: N/A  . Years of Education: N/A   Occupational History  . Retired    Social History Main Topics  . Smoking status: Never Smoker   . Smokeless tobacco: Not on file  . Alcohol Use: No  . Drug Use: Not on file  . Sexually Active: Not on file   Other Topics Concern  . Not on file   Social History Narrative   Regular exercise: Yes: [employer] 4 times/week    Allergies  Allergen Reactions  . Codeine     Current Outpatient Prescriptions  Medication Sig  Dispense Refill  . diltiazem (TIAZAC) 360 MG 24 hr capsule Take 1 capsule (360 mg total) by mouth daily.  90 capsule  6  . warfarin (COUMADIN) 5 MG tablet Take 1 tablet (5 mg total) by mouth daily. Or as directed by doctor  180 tablet  1    ROS Negative other than HPI.   PE General Appearance: well developed, well nourished in no acute distress, obese HEENT: symmetrical face, PERRLA, good dentition  Neck: no JVD, thyromegaly, or adenopathy, trachea midline Chest: symmetric without deformity Cardiac  PMI poorly appreciated, irregular rate and rhythm, normal S1, S2, no gallop, soft systolic murmur at the apex.Lung: clear to ausculation and percussion Vascular: all pulses full without bruits  Abdominal: nondistended, nontender, good bowel sounds, no HSM, no bruits Extremities: no cyanosis, clubbing or edema, no sign of DVT, no varicosities  Skin: normal color, no rashes Neuro: alert and oriented x 3, non-focal Pysch: normal affect Filed Vitals:   06/14/11 0847  BP: 119/77  Pulse: 96  Resp: 14  Height: 6\' 1"  (1.854 m)  Weight: 243 lb (110.224 kg)    EKG  Labs and Studies Reviewed.   Lab Results  Component Value Date   WBC 8.1 04/14/2011   HGB 17.7* 04/14/2011   HCT 51.8 04/14/2011   MCV 95.2 04/14/2011   PLT 186.0 04/14/2011      Chemistry      Component Value Date/Time   NA 141 04/14/2011 0932   K 5.8* 04/14/2011 0932   CL 107 04/14/2011 0932   CO2 29 04/14/2011 0932   BUN 18 04/14/2011 0932   CREATININE 1.4 04/14/2011 0932      Component Value Date/Time   CALCIUM 9.3 04/14/2011 0932   ALKPHOS 84 04/14/2011 0932   AST 20 04/14/2011 0932   ALT 19 04/14/2011 0932   BILITOT 0.6 04/14/2011 0932       Lab Results  Component Value Date   CHOL 167 04/14/2011   CHOL 181 04/11/2010   CHOL 169 03/17/2009   Lab Results  Component Value Date   HDL 38.30* 04/14/2011   HDL 38.20* 04/11/2010   HDL 35.80* 03/17/2009   Lab Results  Component Value Date   LDLCALC 98 04/14/2011    LDLCALC 113* 04/11/2010   LDLCALC 113* 03/17/2009   Lab Results  Component Value Date   TRIG 156.0* 04/14/2011   TRIG 149.0 04/11/2010   TRIG 101.0 03/17/2009   Lab Results  Component Value Date   CHOLHDL 4 04/14/2011   CHOLHDL 5 04/11/2010   CHOLHDL 5 03/17/2009   No results found for this basename: HGBA1C   Lab Results  Component Value Date   ALT 19 04/14/2011   AST 20 04/14/2011   ALKPHOS 84 04/14/2011   BILITOT 0.6 04/14/2011   Lab Results  Component Value Date   TSH 2.21 04/14/2011

## 2011-06-14 NOTE — Assessment & Plan Note (Signed)
Stable. No change in treatment. Followup in a year.

## 2011-06-14 NOTE — Patient Instructions (Signed)
Your physician recommends that you schedule a follow-up appointment in: 1 year with Dr. Wall  

## 2011-06-29 ENCOUNTER — Ambulatory Visit: Payer: Medicare Other

## 2011-06-29 DIAGNOSIS — I4891 Unspecified atrial fibrillation: Secondary | ICD-10-CM

## 2011-06-29 LAB — POCT INR: INR: 2.2

## 2011-06-29 NOTE — Patient Instructions (Signed)
Same dose,5 mg on mondays and thursdays 7.5 mg on other days,check in 4 weeks  

## 2011-08-04 ENCOUNTER — Ambulatory Visit: Payer: Medicare Other

## 2011-08-04 DIAGNOSIS — I4891 Unspecified atrial fibrillation: Secondary | ICD-10-CM

## 2011-08-04 LAB — POCT INR: INR: 2.4

## 2011-08-04 NOTE — Patient Instructions (Signed)
°  Latest dosing instructions  ° Total Sun Mon Tue Wed Thu Fri Sat  ° 47.5 7.5 mg 5 mg 7.5 mg 7.5 mg 5 mg 7.5 mg 7.5 mg  °  (5 mg×1.5) (5 mg×1) (5 mg×1.5) (5 mg×1.5) (5 mg×1) (5 mg×1.5) (5 mg×1.5)  °  °  ° ° °

## 2011-09-05 ENCOUNTER — Ambulatory Visit: Payer: Medicare Other

## 2011-09-05 DIAGNOSIS — Z5181 Encounter for therapeutic drug level monitoring: Secondary | ICD-10-CM

## 2011-09-05 DIAGNOSIS — I4891 Unspecified atrial fibrillation: Secondary | ICD-10-CM

## 2011-09-05 DIAGNOSIS — Z7901 Long term (current) use of anticoagulants: Secondary | ICD-10-CM

## 2011-09-05 LAB — POCT INR: INR: 2.9

## 2011-09-05 NOTE — Patient Instructions (Signed)
  Latest dosing instructions   Total Sun Mon Tue Wed Thu Fri Sat   47.5 7.5 mg 5 mg 7.5 mg 7.5 mg 5 mg 7.5 mg 7.5 mg    (5 mg1.5) (5 mg1) (5 mg1.5) (5 mg1.5) (5 mg1) (5 mg1.5) (5 mg1.5)

## 2011-10-13 ENCOUNTER — Ambulatory Visit (INDEPENDENT_AMBULATORY_CARE_PROVIDER_SITE_OTHER): Payer: Medicare Other | Admitting: Internal Medicine

## 2011-10-13 ENCOUNTER — Encounter: Payer: Self-pay | Admitting: Internal Medicine

## 2011-10-13 DIAGNOSIS — E785 Hyperlipidemia, unspecified: Secondary | ICD-10-CM | POA: Diagnosis not present

## 2011-10-13 DIAGNOSIS — I10 Essential (primary) hypertension: Secondary | ICD-10-CM | POA: Diagnosis not present

## 2011-10-13 DIAGNOSIS — I4891 Unspecified atrial fibrillation: Secondary | ICD-10-CM | POA: Diagnosis not present

## 2011-10-13 DIAGNOSIS — Z7901 Long term (current) use of anticoagulants: Secondary | ICD-10-CM

## 2011-10-13 LAB — POCT INR: INR: 2.3

## 2011-10-13 NOTE — Patient Instructions (Addendum)
Limit your sodium (Salt) intake    It is important that you exercise regularly, at least 20 minutes 3 to 4 times per week.  If you develop chest pain or shortness of breath seek  medical attention.  You need to lose weight.  Consider a lower calorie diet and regular exercise.  Return in 6 months for follow-up    Latest dosing instructions   Total Sun Mon Tue Wed Thu Fri Sat   47.5 7.5 mg 5 mg 7.5 mg 7.5 mg 5 mg 7.5 mg 7.5 mg    (5 mg1.5) (5 mg1) (5 mg1.5) (5 mg1.5) (5 mg1) (5 mg1.5) (5 mg1.5)

## 2011-10-13 NOTE — Progress Notes (Signed)
  Subjective:    Patient ID: Glen [name], male    DOB: 05/09/1928, 78 y.o.   MRN: [id number]  HPI  78 year old patient who is seen today for his biannual followup. He has a history of hypertension and chronic atrial fibrillation. He is doing quite well. He remains on Coumadin anticoagulation with a nice control. He denies any cardiopulmonary complaints. He does try to get to the [facility] 3-4 times per week denies any dyspnea on exertion. He was seen by cardiology approximately 3 months ago    Review of Systems  Constitutional: Negative for fever, chills, appetite change and fatigue.  HENT: Negative for hearing loss, ear pain, congestion, sore throat, trouble swallowing, neck stiffness, dental problem, voice change and tinnitus.   Eyes: Negative for pain, discharge and visual disturbance.  Respiratory: Negative for cough, chest tightness, wheezing and stridor.   Cardiovascular: Negative for chest pain, palpitations and leg swelling.  Gastrointestinal: Negative for nausea, vomiting, abdominal pain, diarrhea, constipation, blood in stool and abdominal distention.  Genitourinary: Negative for urgency, hematuria, flank pain, discharge, difficulty urinating and genital sores.  Musculoskeletal: Positive for back pain. Negative for myalgias, joint swelling, arthralgias and gait problem.  Skin: Negative for rash.  Neurological: Negative for dizziness, syncope, speech difficulty, weakness, numbness and headaches.  Hematological: Negative for adenopathy. Does not bruise/bleed easily.  Psychiatric/Behavioral: Negative for behavioral problems and dysphoric mood. The patient is not nervous/anxious.        Objective:   Physical Exam  Constitutional: He is oriented to person, place, and time. He appears well-developed.  HENT:  Head: Normocephalic.  Right Ear: External ear normal.  Left Ear: External ear normal.  Eyes: Conjunctivae and EOM are normal.  Neck: Normal range of motion.  Cardiovascular:  Normal rate and normal heart sounds.        Irregular rhythm with a controlled ventricular response  Pulmonary/Chest: Effort normal. No respiratory distress. He has no wheezes. He has rales.       Rales at the right base  Abdominal: Bowel sounds are normal.  Musculoskeletal: Normal range of motion. He exhibits no edema and no tenderness.  Neurological: He is alert and oriented to person, place, and time.  Psychiatric: He has a normal mood and affect. His behavior is normal.          Assessment & Plan:   Hypertension well controlled Chronic atrial fibrillation Chronic Coumadin anticoagulation  We'll see for annual exam in 6 months Medical regimen unchanged

## 2011-11-14 ENCOUNTER — Ambulatory Visit (INDEPENDENT_AMBULATORY_CARE_PROVIDER_SITE_OTHER): Payer: Medicare Other | Admitting: *Deleted

## 2011-11-14 DIAGNOSIS — Z7901 Long term (current) use of anticoagulants: Secondary | ICD-10-CM | POA: Diagnosis not present

## 2011-11-14 DIAGNOSIS — I4891 Unspecified atrial fibrillation: Secondary | ICD-10-CM

## 2011-11-14 LAB — POCT INR: INR: 2.6

## 2011-11-14 NOTE — Progress Notes (Signed)
  Subjective:    Patient ID: Glen [name], male    DOB: Apr 12, 1928, 78 y.o.   MRN: [id number]  HPI    Review of Systems     Objective:   Physical Exam        Assessment & Plan:

## 2011-11-14 NOTE — Patient Instructions (Signed)
°  Latest dosing instructions  ° Total Sun Mon Tue Wed Thu Fri Sat  ° 47.5 7.5 mg 5 mg 7.5 mg 7.5 mg 5 mg 7.5 mg 7.5 mg  °  (5 mg×1.5) (5 mg×1) (5 mg×1.5) (5 mg×1.5) (5 mg×1) (5 mg×1.5) (5 mg×1.5)  °  °  ° ° °

## 2011-12-12 ENCOUNTER — Ambulatory Visit (INDEPENDENT_AMBULATORY_CARE_PROVIDER_SITE_OTHER): Payer: Medicare Other | Admitting: Internal Medicine

## 2011-12-12 ENCOUNTER — Other Ambulatory Visit: Payer: Self-pay

## 2011-12-12 DIAGNOSIS — I4891 Unspecified atrial fibrillation: Secondary | ICD-10-CM

## 2011-12-12 LAB — POCT INR: INR: 2.1

## 2011-12-12 MED ORDER — WARFARIN SODIUM 5 MG PO TABS: 5.0000 mg | ORAL_TABLET | Freq: Every day | ORAL | Status: DC

## 2011-12-12 NOTE — Patient Instructions (Signed)
°  Latest dosing instructions  ° Total Sun Mon Tue Wed Thu Fri Sat  ° 47.5 7.5 mg 5 mg 7.5 mg 7.5 mg 5 mg 7.5 mg 7.5 mg  °  (5 mg×1.5) (5 mg×1) (5 mg×1.5) (5 mg×1.5) (5 mg×1) (5 mg×1.5) (5 mg×1.5)  °  °  ° ° °

## 2012-01-12 ENCOUNTER — Ambulatory Visit (INDEPENDENT_AMBULATORY_CARE_PROVIDER_SITE_OTHER): Payer: Medicare Other | Admitting: *Deleted

## 2012-01-12 DIAGNOSIS — Z7901 Long term (current) use of anticoagulants: Secondary | ICD-10-CM

## 2012-01-12 DIAGNOSIS — I4891 Unspecified atrial fibrillation: Secondary | ICD-10-CM

## 2012-01-12 LAB — POCT INR: INR: 2.6

## 2012-01-12 NOTE — Patient Instructions (Signed)
°  Latest dosing instructions  ° Total Sun Mon Tue Wed Thu Fri Sat  ° 47.5 7.5 mg 5 mg 7.5 mg 7.5 mg 5 mg 7.5 mg 7.5 mg  °  (5 mg×1.5) (5 mg×1) (5 mg×1.5) (5 mg×1.5) (5 mg×1) (5 mg×1.5) (5 mg×1.5)  °  °  ° ° °

## 2012-01-15 DIAGNOSIS — Z961 Presence of intraocular lens: Secondary | ICD-10-CM | POA: Diagnosis not present

## 2012-02-12 ENCOUNTER — Ambulatory Visit (INDEPENDENT_AMBULATORY_CARE_PROVIDER_SITE_OTHER): Payer: Medicare Other | Admitting: Internal Medicine

## 2012-02-12 DIAGNOSIS — I4891 Unspecified atrial fibrillation: Secondary | ICD-10-CM

## 2012-02-12 LAB — POCT INR: INR: 3.4

## 2012-02-12 NOTE — Patient Instructions (Signed)
°  Latest dosing instructions  ° Total Sun Mon Tue Wed Thu Fri Sat  ° 47.5 7.5 mg 5 mg 7.5 mg 7.5 mg 5 mg 7.5 mg 7.5 mg  °  (5 mg×1.5) (5 mg×1) (5 mg×1.5) (5 mg×1.5) (5 mg×1) (5 mg×1.5) (5 mg×1.5)  °  °  ° ° °

## 2012-03-14 ENCOUNTER — Ambulatory Visit (INDEPENDENT_AMBULATORY_CARE_PROVIDER_SITE_OTHER): Payer: Medicare Other | Admitting: Family

## 2012-03-14 DIAGNOSIS — I4891 Unspecified atrial fibrillation: Secondary | ICD-10-CM | POA: Diagnosis not present

## 2012-03-14 LAB — POCT INR: INR: 2.7

## 2012-03-14 NOTE — Patient Instructions (Addendum)
Same dose,5 mg on mondays and thursdays 7.5 mg on other days,check in 4 weeks    Latest dosing instructions   Total Sun Mon Tue Wed Thu Fri Sat   47.5 7.5 mg 5 mg 7.5 mg 7.5 mg 5 mg 7.5 mg 7.5 mg    (5 mg1.5) (5 mg1) (5 mg1.5) (5 mg1.5) (5 mg1) (5 mg1.5) (5 mg1.5)

## 2012-04-12 ENCOUNTER — Encounter: Payer: BLUE CROSS/BLUE SHIELD | Admitting: Family

## 2012-04-15 ENCOUNTER — Encounter: Payer: Self-pay | Admitting: Internal Medicine

## 2012-04-15 ENCOUNTER — Ambulatory Visit (INDEPENDENT_AMBULATORY_CARE_PROVIDER_SITE_OTHER): Payer: Medicare Other | Admitting: Internal Medicine

## 2012-04-15 ENCOUNTER — Ambulatory Visit: Payer: Medicare Other | Admitting: Family

## 2012-04-15 VITALS — BP 110/82 | HR 92 | Temp 98.0°F | Resp 20 | Ht 72.5 in | Wt 252.0 lb

## 2012-04-15 DIAGNOSIS — Z7901 Long term (current) use of anticoagulants: Secondary | ICD-10-CM

## 2012-04-15 DIAGNOSIS — I10 Essential (primary) hypertension: Secondary | ICD-10-CM | POA: Diagnosis not present

## 2012-04-15 DIAGNOSIS — E785 Hyperlipidemia, unspecified: Secondary | ICD-10-CM | POA: Diagnosis not present

## 2012-04-15 DIAGNOSIS — I4891 Unspecified atrial fibrillation: Secondary | ICD-10-CM | POA: Diagnosis not present

## 2012-04-15 DIAGNOSIS — Z0000 Encounter for general adult medical examination without abnormal findings: Secondary | ICD-10-CM | POA: Diagnosis not present

## 2012-04-15 LAB — COMPREHENSIVE METABOLIC PANEL
ALT: 21 U/L (ref 0–53)
AST: 24 U/L (ref 0–37)
Albumin: 4.1 g/dL (ref 3.5–5.2)
Alkaline Phosphatase: 79 U/L (ref 39–117)
BUN: 20 mg/dL (ref 6–23)
CO2: 25 mEq/L (ref 19–32)
Calcium: 8.9 mg/dL (ref 8.4–10.5)
Chloride: 106 mEq/L (ref 96–112)
Creatinine, Ser: 1.3 mg/dL (ref 0.4–1.5)
GFR: 56.65 mL/min — ABNORMAL LOW (ref >60.00)
Glucose, Bld: 102 mg/dL — ABNORMAL HIGH (ref 70–99)
Potassium: 4.3 mEq/L (ref 3.5–5.1)
Sodium: 139 mEq/L (ref 135–145)
Total Bilirubin: 0.7 mg/dL (ref 0.3–1.2)
Total Protein: 6.9 g/dL (ref 6.0–8.3)

## 2012-04-15 LAB — CBC WITH DIFFERENTIAL/PLATELET
Basophils Absolute: 0 10*3/uL (ref 0.0–0.1)
Basophils Relative: 0.5 % (ref 0.0–3.0)
Eosinophils Absolute: 0.1 10*3/uL (ref 0.0–0.7)
Eosinophils Relative: 1.1 % (ref 0.0–5.0)
HCT: 49.7 % (ref 39.0–52.0)
Hemoglobin: 16.7 g/dL (ref 13.0–17.0)
Lymphocytes Relative: 29 % (ref 12.0–46.0)
Lymphs Abs: 2 10*3/uL (ref 0.7–4.0)
MCHC: 33.7 g/dL (ref 30.0–36.0)
MCV: 95.3 fl (ref 78.0–100.0)
Monocytes Absolute: 0.5 10*3/uL (ref 0.1–1.0)
Monocytes Relative: 7.2 % (ref 3.0–12.0)
Neutro Abs: 4.2 10*3/uL (ref 1.4–7.7)
Neutrophils Relative %: 62.2 % (ref 43.0–77.0)
Platelets: 170 10*3/uL (ref 150.0–400.0)
RBC: 5.21 Mil/uL (ref 4.22–5.81)
RDW: 13.3 % (ref 11.5–14.6)
WBC: 6.8 10*3/uL (ref 4.5–10.5)

## 2012-04-15 LAB — TSH: TSH: 2.72 u[IU]/mL (ref 0.35–5.50)

## 2012-04-15 LAB — LIPID PANEL
Cholesterol: 181 mg/dL (ref 0–200)
HDL: 39.7 mg/dL (ref >39.00)
LDL Cholesterol: 115 mg/dL — ABNORMAL HIGH (ref 0–99)
Total CHOL/HDL Ratio: 5
Triglycerides: 131 mg/dL (ref 0.0–149.0)
VLDL: 26.2 mg/dL (ref 0.0–40.0)

## 2012-04-15 LAB — POCT INR: INR: 2.6

## 2012-04-15 MED ORDER — DILTIAZEM HCL ER BEADS 360 MG PO CP24: 360.0000 mg | ORAL_CAPSULE | Freq: Every day | ORAL | Status: DC

## 2012-04-15 MED ORDER — WARFARIN SODIUM 5 MG PO TABS: 5.0000 mg | ORAL_TABLET | Freq: Every day | ORAL | Status: DC

## 2012-04-15 NOTE — Patient Instructions (Addendum)
  Latest dosing instructions   Total Sun Mon Tue Wed Thu Fri Sat   47.5 7.5 mg 5 mg 7.5 mg 7.5 mg 5 mg 7.5 mg 7.5 mg    (5 mg1.5) (5 mg1) (5 mg1.5) (5 mg1.5) (5 mg1) (5 mg1.5) (5 mg1.5)       Same dose,5 mg on mondays and thursdays 7.5 mg on other days,check in 4 weeks

## 2012-04-15 NOTE — Patient Instructions (Signed)
Limit your sodium (Salt) intake    It is important that you exercise regularly, at least 20 minutes 3 to 4 times per week.  If you develop chest pain or shortness of breath seek  medical attention.  You need to lose weight.  Consider a lower calorie diet and regular exercise.  Return in 6 months for follow-up  Cardiology followup as scheduled  Monthly Coumadin checks

## 2012-04-15 NOTE — Progress Notes (Signed)
Subjective:    Patient ID: Glen [name], male    DOB: 1928-02-19, 78 y.o.   MRN: [id number]  HPI  Wt Readings from Last 3 Encounters:  04/15/12 252 lb (114.306 kg)  10/13/11 249 lb (112.946 kg)  06/14/11 243 lb (110.224 kg)    Review of Systems     Objective:   Physical Exam        Assessment & Plan:   Subjective:    Patient ID: Glen [name], male    DOB: 01-May-1928, 78 y.o.   MRN: [id number]  HPI  CC: cpx - donig well.  History of Present Illness:   78 year-old patient who is seen today for a comprehensive evaluation. He is followed closely cardiology due to chronic atrial fibrillation. He is on warfarin anticoagulation. He has a history of hypertension, mild, dyslipidemia, and exogenous obesity. He has a history of colonic polyps and a family history of colon cancer. His cardiopulmonary status has been stable. There has been some weight gain since his last visit here.   Here for Medicare AWV:   1. Risk factors based on Past M, S, F history: risk factors include a family history of prostate and colon cancer.  2. Physical Activities: fairly sedentary, but no exercise restrictions  3. Depression/mood: no history of depression or mood disorder  4. Hearing: no hearing deficits  5. ADL's: completely independent in all aspects of daily living  6. Fall Risk: low  7. Home Safety: no problems identified  8. Height, weight, &visual acuity: unremarkable except for a 10-pound weight gain over the past 6 months  9. Counseling: heart healthy diet calorie restriction and more exercise. All encouraged  10. Labs ordered based on risk factors: laboratory profile, including lipid panel will be reviewed. PSA test in discuss and it was elected to defer  11. Referral Coordination- will consider a colonoscopy follow-up in 6 months  12. Care Plan- heart healthy diet restricted salt and weight loss. All encouraged  13. Cognitive Assessment- alert and oriented, with normal affect. Several  family members have early dementia   Preventive Screening-Counseling & Management  Alcohol-Tobacco  Smoking Status: never   Allergies:  1) ! Codeine  Past History:   Past Medical History:  ATRIAL FIBRILLATION (ICD-427.31)  ANTICOAGULATION THERAPY (ICD-V58.61)  PALPITATIONS (ICD-785.1)  LEFT VENTRICULAR FUNCTION, DECREASED (ICD-429.2)  HYPERTENSION, UNSPECIFIED (ICD-401.9)  HYPERLIPIDEMIA (ICD-272.4)  OBESITY (ICD-278.00)  ENCOUNTER FOR THERAPEUTIC DRUG MONITORING (ICD-V58.83)  SPECIAL SCREENING MALIGNANT NEOPLASM OF PROSTATE (ICD-V76.44)  FAMILY HISTORY DIABETES 1ST DEGREE RELATIVE (ICD-V18.0)  FAMILY HISTORY OF COLON CA 1ST DEGREE RELATIVE <60 (ICD-V16.0)  DIVERTICULITIS, HX OF (ICD-V12.79)  COLONIC POLYPS, HX OF (ICD-V12.72)  COLONIC POLYPS, HX OF (ICD-V12.72)   Past Surgical History:  Colonoscopy December 2005  Anal Fistula Repair  Arthroscopic R Knee  Tonsillectomy  negative ETT 1991  Cataract extraction 2004  status post balloon valvuloplasty at [facility] in 1996  2-D echocardiogram November 2009, 2011   Family History:  Reviewed history from 03/13/2008 and no changes required.  Family History of Colon CA 1st degree relative <60  Family History Diabetes 1st degree relative  Family History of Prostate CA 1st degree relative <50  Family History of Cardiovascular disorder  father died age 44 of a myocardial infarction  mother died age 84, colon cancer  Two brothers 4 sisters  Positive for coronary disease. Two siblings, status post stenting to siblings with heart disease. One. Status post pacemaker insertion. One brother with prostate cancer, status post RT  Social History:  Reviewed history from 03/17/2009 and no changes required.  Retired  Never Smoked  Alcohol use-no  Married  Regular exercise-yes: [employer] 4 times/week   Wt Readings from Last 3 Encounters:  04/14/11 245 lb (111.131 kg)  10/10/10 240 lb (108.863 kg)  04/11/10 240 lb (108.863 kg)    Past  Medical History  Diagnosis Date  . Arrhythmia   . Drug therapy     Anticoagulation Therapy  . Palpitation   . Decreased left ventricular function   . Hypertension   . Hyperlipidemia   . Obesity   . Neoplasm of prostate     special screening malignant   . Diverticulitis   . History of colonic polyps   . FHx: colon cancer   . Family history of diabetes insipidus        Review of Systems  Constitutional: Negative for fever, chills, activity change, appetite change and fatigue.  HENT: Negative for hearing loss, ear pain, congestion, rhinorrhea, sneezing, mouth sores, trouble swallowing, neck pain, neck stiffness, dental problem, voice change, sinus pressure and tinnitus.   Eyes: Negative for photophobia, pain, redness and visual disturbance.  Respiratory: Negative for apnea, cough, choking, chest tightness, shortness of breath and wheezing.   Cardiovascular: Negative for chest pain, palpitations and leg swelling.  Gastrointestinal: Negative for nausea, vomiting, abdominal pain, diarrhea, constipation, blood in stool, abdominal distention, anal bleeding and rectal pain.  Genitourinary: Negative for dysuria, urgency, frequency, hematuria, flank pain, decreased urine volume, discharge, penile swelling, scrotal swelling, difficulty urinating, genital sores and testicular pain.  Musculoskeletal: Negative for myalgias, back pain, joint swelling, arthralgias and gait problem.  Skin: Negative for color change, rash and wound.  Neurological: Negative for dizziness, tremors, seizures, syncope, facial asymmetry, speech difficulty, weakness, light-headedness, numbness and headaches.  Hematological: Negative for adenopathy. Does not bruise/bleed easily.  Psychiatric/Behavioral: Negative for suicidal ideas, hallucinations, behavioral problems, confusion, sleep disturbance, self-injury, dysphoric mood, decreased concentration and agitation. The patient is not nervous/anxious.        Objective:    Physical Exam  Constitutional: He appears well-developed and well-nourished.  HENT:  Head: Normocephalic and atraumatic.  Right Ear: External ear normal.  Left Ear: External ear normal.  Nose: Nose normal.  Mouth/Throat: Oropharynx is clear and moist.  Eyes: Conjunctivae and EOM are normal. Pupils are equal, round, and reactive to light. No scleral icterus.  Neck: Normal range of motion. Neck supple. No JVD present. No thyromegaly present.  Cardiovascular: Normal rate, normal heart sounds and intact distal pulses.  Exam reveals no gallop and no friction rub.   No murmur heard.      Irregular rhythm with a controlled ventricular response  Pulmonary/Chest: Effort normal and breath sounds normal. He exhibits no tenderness.  few crackles right base Abdominal: Soft. Bowel sounds are normal. He exhibits no distension and no mass. There is no tenderness.  Genitourinary: Rectum normal, prostate normal and penis normal. Guaiac negative stool. No penile tenderness.  Musculoskeletal: Normal range of motion. He exhibits edema. He exhibits no tenderness.       +1 lower extremity edema.  Lymphadenopathy:    He has no cervical adenopathy.  Neurological: He is alert. He has normal reflexes. No cranial nerve deficit. Coordination normal.  Skin: Skin is warm and dry. No rash noted.  Psychiatric: He has a normal mood and affect. His behavior is normal.          Assessment & Plan:   Annual health assessment Hypertension well controlled Chronic atrial  fibrillation Chronic Coumadin anticoagulation Exogenous obesity  Laboratory data including prothrombin time will be reviewed Followup cardiology  Return here 6 months

## 2012-05-14 ENCOUNTER — Ambulatory Visit (INDEPENDENT_AMBULATORY_CARE_PROVIDER_SITE_OTHER): Payer: Medicare Other | Admitting: Family

## 2012-05-14 DIAGNOSIS — I4891 Unspecified atrial fibrillation: Secondary | ICD-10-CM | POA: Diagnosis not present

## 2012-05-14 LAB — POCT INR: INR: 2.6

## 2012-05-14 NOTE — Patient Instructions (Addendum)
Same dose,5 mg on mondays and thursdays 7.5 mg on other days,check in 6 weeks    Latest dosing instructions   Total Sun Mon Tue Wed Thu Fri Sat   47.5 7.5 mg 5 mg 7.5 mg 7.5 mg 5 mg 7.5 mg 7.5 mg    (5 mg1.5) (5 mg1) (5 mg1.5) (5 mg1.5) (5 mg1) (5 mg1.5) (5 mg1.5)        

## 2012-06-17 ENCOUNTER — Encounter: Payer: Self-pay | Admitting: Cardiology

## 2012-06-17 ENCOUNTER — Ambulatory Visit (INDEPENDENT_AMBULATORY_CARE_PROVIDER_SITE_OTHER): Payer: Medicare Other | Admitting: Cardiology

## 2012-06-17 VITALS — BP 131/61 | HR 85 | Ht 73.0 in | Wt 250.8 lb

## 2012-06-17 DIAGNOSIS — R002 Palpitations: Secondary | ICD-10-CM

## 2012-06-17 DIAGNOSIS — E785 Hyperlipidemia, unspecified: Secondary | ICD-10-CM | POA: Diagnosis not present

## 2012-06-17 DIAGNOSIS — I10 Essential (primary) hypertension: Secondary | ICD-10-CM | POA: Diagnosis not present

## 2012-06-17 DIAGNOSIS — Z7901 Long term (current) use of anticoagulants: Secondary | ICD-10-CM

## 2012-06-17 DIAGNOSIS — I4891 Unspecified atrial fibrillation: Secondary | ICD-10-CM

## 2012-06-17 NOTE — Assessment & Plan Note (Signed)
Clinically stable, continue rate control and anticoagulation. Return the office in one year.

## 2012-06-17 NOTE — Progress Notes (Signed)
HPI Glen [name] returns today for evaluation and management as chronic A. fib and anticoagulation. He also has hypertension and hyperlipidemia.  He currently is having no symptoms of A. fib including any palpitations, chest discomfort, shortness of breath, presyncope or syncope. He is a mild reduction left ventricular systolic function but denies orthopnea, PND or edema. He does have chronic fatigue.  He is compliant with his medications. He denies any bleeding or melena.  Past Medical History  Diagnosis Date  . Arrhythmia   . Drug therapy     Anticoagulation Therapy  . Palpitation   . Decreased left ventricular function   . Hypertension   . Hyperlipidemia   . Obesity   . Neoplasm of prostate     special screening malignant   . Diverticulitis   . History of colonic polyps   . FHx: colon cancer   . Family history of diabetes insipidus     Current Outpatient Prescriptions  Medication Sig Dispense Refill  . diltiazem (TIAZAC) 360 MG 24 hr capsule Take 1 capsule (360 mg total) by mouth daily.  90 capsule  6  . warfarin (COUMADIN) 5 MG tablet Take 1 tablet (5 mg total) by mouth daily. Or as directed by doctor  180 tablet  3    Allergies  Allergen Reactions  . Codeine     Family History  Problem Relation Age of Onset  . Colon cancer    . Diabetes    . Prostate cancer Brother   . Heart disease    . Heart attack Father   . Coronary artery disease      Two siblings, status post stenting to siblings with heart disease. One. Status post pacemaker insertion.    History   Social History  . Marital Status: Married    Spouse Name: N/A    Number of Children: N/A  . Years of Education: N/A   Occupational History  . Retired    Social History Main Topics  . Smoking status: Never Smoker   . Smokeless tobacco: Never Used  . Alcohol Use: No  . Drug Use: Not on file  . Sexually Active: Not on file   Other Topics Concern  . Not on file   Social History Narrative   Regular  exercise: Yes: [employer] 4 times/week    ROS ALL NEGATIVE EXCEPT THOSE NOTED IN HPI  PE  General Appearance: well developed, well nourished in no acute distress, overweight HEENT: symmetrical face, PERRLA, good dentition  Neck: no JVD, thyromegaly, or adenopathy, trachea midline Chest: symmetric without deformity Cardiac: PMI non-displaced, irregular rate and rhythm, normal S1, S2, no gallop or murmur Lung: clear to ausculation and percussion Vascular: all pulses full without bruits  Abdominal: nondistended, nontender, good bowel sounds, no HSM, no bruits Extremities: no cyanosis, clubbing or edema, no sign of DVT, no varicosities, dependent rubor.  Skin: normal color, no rashes Neuro: alert and oriented x 3, non-focal Pysch: normal affect  EKG Chronic A. fib, otherwise normal EKG with a well-controlled rate. BMET    Component Value Date/Time   NA 139 04/15/2012 0937   K 4.3 04/15/2012 0937   CL 106 04/15/2012 0937   CO2 25 04/15/2012 0937   GLUCOSE 102* 04/15/2012 0937   GLUCOSE 87 09/06/2006 1031   BUN 20 04/15/2012 0937   CREATININE 1.3 04/15/2012 0937   CALCIUM 8.9 04/15/2012 0937   GFRNONAA 75.52 04/11/2010 0952   GFRAA 83 03/13/2008 0000    Lipid Panel  Component Value Date/Time   CHOL 181 04/15/2012 0937   TRIG 131.0 04/15/2012 0937   HDL 39.70 04/15/2012 0937   CHOLHDL 5 04/15/2012 0937   VLDL 26.2 04/15/2012 0937   LDLCALC 115* 04/15/2012 0937    CBC    Component Value Date/Time   WBC 6.8 04/15/2012 0937   RBC 5.21 04/15/2012 0937   HGB 16.7 04/15/2012 0937   HCT 49.7 04/15/2012 0937   PLT 170.0 04/15/2012 0937   MCV 95.3 04/15/2012 0937   MCHC 33.7 04/15/2012 0937   RDW 13.3 04/15/2012 0937   LYMPHSABS 2.0 04/15/2012 0937   MONOABS 0.5 04/15/2012 0937   EOSABS 0.1 04/15/2012 0937   BASOSABS 0.0 04/15/2012 4098

## 2012-06-17 NOTE — Patient Instructions (Addendum)
Your physician wants you to follow-up in: 1 year. You will receive a reminder letter in the mail two months in advance. If you don't receive a letter, please call our office to schedule the follow-up appointment.  

## 2012-06-25 ENCOUNTER — Ambulatory Visit (INDEPENDENT_AMBULATORY_CARE_PROVIDER_SITE_OTHER): Payer: Medicare Other | Admitting: Family

## 2012-06-25 DIAGNOSIS — Z23 Encounter for immunization: Secondary | ICD-10-CM

## 2012-06-25 DIAGNOSIS — I4891 Unspecified atrial fibrillation: Secondary | ICD-10-CM

## 2012-06-25 LAB — POCT INR: INR: 3

## 2012-06-25 NOTE — Patient Instructions (Addendum)
Same dose,5 mg on mondays and thursdays 7.5 mg on other days,check in 6 weeks    Latest dosing instructions   Total Sun Mon Tue Wed Thu Fri Sat   47.5 7.5 mg 5 mg 7.5 mg 7.5 mg 5 mg 7.5 mg 7.5 mg    (5 mg1.5) (5 mg1) (5 mg1.5) (5 mg1.5) (5 mg1) (5 mg1.5) (5 mg1.5)        

## 2012-08-06 ENCOUNTER — Ambulatory Visit (INDEPENDENT_AMBULATORY_CARE_PROVIDER_SITE_OTHER): Payer: Medicare Other | Admitting: Family

## 2012-08-06 DIAGNOSIS — I4891 Unspecified atrial fibrillation: Secondary | ICD-10-CM

## 2012-08-06 LAB — POCT INR: INR: 2.7

## 2012-08-06 NOTE — Patient Instructions (Addendum)
Same dose,5 mg on mondays and thursdays 7.5 mg on other days,check in 6 weeks    Latest dosing instructions   Total Sun Mon Tue Wed Thu Fri Sat   47.5 7.5 mg 5 mg 7.5 mg 7.5 mg 5 mg 7.5 mg 7.5 mg    (5 mg1.5) (5 mg1) (5 mg1.5) (5 mg1.5) (5 mg1) (5 mg1.5) (5 mg1.5)

## 2012-09-17 ENCOUNTER — Ambulatory Visit (INDEPENDENT_AMBULATORY_CARE_PROVIDER_SITE_OTHER): Payer: Medicare Other | Admitting: Family

## 2012-09-17 DIAGNOSIS — Z7901 Long term (current) use of anticoagulants: Secondary | ICD-10-CM

## 2012-09-17 DIAGNOSIS — I4891 Unspecified atrial fibrillation: Secondary | ICD-10-CM

## 2012-09-17 LAB — POCT INR: INR: 3.2

## 2012-09-17 NOTE — Patient Instructions (Addendum)
Today, only take 1 tab. Then same dose,5 mg on mondays and thursdays 7.5 mg on other days,check in 4 weeks    Latest dosing instructions   Total Sun Mon Tue Wed Thu Fri Sat   47.5 7.5 mg 5 mg 7.5 mg 7.5 mg 5 mg 7.5 mg 7.5 mg    (5 mg1.5) (5 mg1) (5 mg1.5) (5 mg1.5) (5 mg1) (5 mg1.5) (5 mg1.5)        

## 2012-10-16 ENCOUNTER — Ambulatory Visit (INDEPENDENT_AMBULATORY_CARE_PROVIDER_SITE_OTHER): Payer: BLUE CROSS/BLUE SHIELD | Admitting: Family

## 2012-10-16 ENCOUNTER — Ambulatory Visit (INDEPENDENT_AMBULATORY_CARE_PROVIDER_SITE_OTHER): Payer: Medicare Other | Admitting: Internal Medicine

## 2012-10-16 ENCOUNTER — Encounter: Payer: Self-pay | Admitting: Internal Medicine

## 2012-10-16 VITALS — BP 130/80 | HR 95 | Temp 97.9°F | Resp 20 | Wt 252.0 lb

## 2012-10-16 DIAGNOSIS — I4891 Unspecified atrial fibrillation: Secondary | ICD-10-CM

## 2012-10-16 DIAGNOSIS — E669 Obesity, unspecified: Secondary | ICD-10-CM

## 2012-10-16 DIAGNOSIS — I10 Essential (primary) hypertension: Secondary | ICD-10-CM

## 2012-10-16 DIAGNOSIS — E785 Hyperlipidemia, unspecified: Secondary | ICD-10-CM

## 2012-10-16 DIAGNOSIS — Z7901 Long term (current) use of anticoagulants: Secondary | ICD-10-CM

## 2012-10-16 LAB — POCT INR: INR: 2.8

## 2012-10-16 MED ORDER — DILTIAZEM HCL ER BEADS 360 MG PO CP24: 360.0000 mg | ORAL_CAPSULE | Freq: Every day | ORAL | Status: DC

## 2012-10-16 MED ORDER — WARFARIN SODIUM 5 MG PO TABS: 5.0000 mg | ORAL_TABLET | Freq: Every day | ORAL | Status: DC

## 2012-10-16 NOTE — Patient Instructions (Addendum)
Today, only take 1 tab. Then same dose,5 mg on mondays and thursdays 7.5 mg on other days,check in 4 weeks    Latest dosing instructions   Total Sun Mon Tue Wed Thu Fri Sat   47.5 7.5 mg 5 mg 7.5 mg 7.5 mg 5 mg 7.5 mg 7.5 mg    (5 mg1.5) (5 mg1) (5 mg1.5) (5 mg1.5) (5 mg1) (5 mg1.5) (5 mg1.5)

## 2012-10-16 NOTE — Patient Instructions (Signed)
Limit your sodium (Salt) intake    It is important that you exercise regularly, at least 20 minutes 3 to 4 times per week.  If you develop chest pain or shortness of breath seek  medical attention.  You need to lose weight.  Consider a lower calorie diet and regular exercise. 

## 2012-10-16 NOTE — Progress Notes (Signed)
Subjective:    Patient ID: Glen [name], male    DOB: 10/06/1927, 79 y.o.   MRN: [id number]  HPI  79 year old patient who is seen today for his biannual followup. He remains on chronic Coumadin anticoagulation for chronic atrial fibrillation. He was seen by cardiology approximately 3 months ago. He continues to do well clinically. He has a history also of hypertension exogenous obesity and osteoarthritis. He continues to have right knee pain. He does try to get to his health club 4 times weekly. He has mild dyslipidemia. Denies any cardiopulmonary complaints.  Wt Readings from Last 3 Encounters:  10/16/12 252 lb (114.306 kg)  06/17/12 250 lb 13.6 oz (113.785 kg)  04/15/12 252 lb (114.306 kg)      Review of Systems  Constitutional: Negative for fever, chills, appetite change and fatigue.  HENT: Negative for hearing loss, ear pain, congestion, sore throat, trouble swallowing, neck stiffness, dental problem, voice change and tinnitus.   Eyes: Negative for pain, discharge and visual disturbance.  Respiratory: Negative for cough, chest tightness, wheezing and stridor.   Cardiovascular: Negative for chest pain, palpitations and leg swelling.  Gastrointestinal: Negative for nausea, vomiting, abdominal pain, diarrhea, constipation, blood in stool and abdominal distention.  Genitourinary: Negative for urgency, hematuria, flank pain, discharge, difficulty urinating and genital sores.  Musculoskeletal: Negative for myalgias, back pain, joint swelling, arthralgias and gait problem.  Skin: Negative for rash.  Neurological: Negative for dizziness, syncope, speech difficulty, weakness, numbness and headaches.  Hematological: Negative for adenopathy. Does not bruise/bleed easily.  Psychiatric/Behavioral: Negative for behavioral problems and dysphoric mood. The patient is not nervous/anxious.    Past Medical History  Diagnosis Date  . Arrhythmia   . Drug therapy     Anticoagulation Therapy  .  Palpitation   . Decreased left ventricular function   . Hypertension   . Hyperlipidemia   . Obesity   . Neoplasm of prostate     special screening malignant   . Diverticulitis   . History of colonic polyps   . FHx: colon cancer   . Family history of diabetes insipidus     History   Social History  . Marital Status: Married    Spouse Name: N/A    Number of Children: N/A  . Years of Education: N/A   Occupational History  . Retired    Social History Main Topics  . Smoking status: Never Smoker   . Smokeless tobacco: Never Used  . Alcohol Use: No  . Drug Use: Not on file  . Sexually Active: Not on file   Other Topics Concern  . Not on file   Social History Narrative   Regular exercise: Yes: [employer] 4 times/week    Past Surgical History  Procedure Date  . Colonoscopy December 2005  . Anal fistula repair   . Knee arthroscopy     Right  . Tonsillectomy   . Ett 1991    Negative  . Cataract extraction 2004  . Cardiac valve surgery 1996    status post balloon val[facility]ul[facility] at [facility]  . 2-d echocardiogram November 2009, 2011    Family History  Problem Relation Age of Onset  . Colon cancer    . Diabetes    . Prostate cancer Brother   . Heart disease    . Heart attack Father   . Coronary artery disease      Two siblings, status post stenting to siblings with heart disease. One. Status post pacemaker insertion.    Allergies  Allergen Reactions  . Codeine     Current Outpatient Prescriptions on File Prior to Visit  Medication Sig Dispense Refill  . diltiazem (TIAZAC) 360 MG 24 hr capsule Take 1 capsule (360 mg total) by mouth daily.  90 capsule  6  . warfarin (COUMADIN) 5 MG tablet Take 1 tablet (5 mg total) by mouth daily. Or as directed by doctor  180 tablet  3    BP 130/80  Pulse 95  Temp 97.9 F (36.6 C) (Oral)  Resp 20  Wt 252 lb (114.306 kg)  SpO2 94%        Objective:   Physical Exam  Constitutional: He appears well-developed and  well-nourished.  HENT:  Head: Normocephalic and atraumatic.  Right Ear: External ear normal.  Left Ear: External ear normal.  Nose: Nose normal.  Mouth/Throat: Oropharynx is clear and moist.  Eyes: Conjunctivae normal and EOM are normal. Pupils are equal, round, and reactive to light. No scleral icterus.  Neck: Normal range of motion. Neck supple. No JVD present. No thyromegaly present.  Cardiovascular: Normal heart sounds and intact distal pulses.  Exam reveals no gallop and no friction rub.   No murmur heard.      Controlled ventricular response  Pulmonary/Chest: Effort normal and breath sounds normal. He exhibits no tenderness.       Few crackles right base  Abdominal: Soft. Bowel sounds are normal. He exhibits no distension and no mass. There is no tenderness.  Genitourinary: Prostate normal and penis normal.  Musculoskeletal: Normal range of motion. He exhibits no edema and no tenderness.       Trace edema  Lymphadenopathy:    He has no cervical adenopathy.  Neurological: He is alert. He has normal reflexes. No cranial nerve deficit. Coordination normal.  Skin: Skin is warm and dry. No rash noted.  Psychiatric: He has a normal mood and affect. His behavior is normal.          Assessment & Plan:   Hypertension well controlled  Chronic atrial fibrillation Chronic Coumadin anticoagulation Exogenous obesity Osteoarthritis with right knee pain   Medicines unchanged and refill Weight loss encouraged CPX with lab update in 6 months

## 2012-11-28 ENCOUNTER — Ambulatory Visit (INDEPENDENT_AMBULATORY_CARE_PROVIDER_SITE_OTHER): Payer: Medicare Other | Admitting: Family

## 2012-11-28 DIAGNOSIS — I4891 Unspecified atrial fibrillation: Secondary | ICD-10-CM | POA: Diagnosis not present

## 2012-11-28 LAB — POCT INR: INR: 3.3

## 2012-11-28 NOTE — Patient Instructions (Addendum)
Hold Coumadin today only.  Then same dose,5 mg on mondays and thursdays 7.5 mg on other days,check in 4 weeks  Anticoagulation Dose Instructions as of 11/28/2012     Glynis Smiles Tue Wed Thu Fri Sat   New Dose 7.5 mg 5 mg 7.5 mg 7.5 mg 5 mg 7.5 mg 7.5 mg    Description       Hold Coumadin today only.  Then same dose,5 mg on mondays and thursdays 7.5 mg on other days,check in 4 weeks

## 2012-12-24 ENCOUNTER — Ambulatory Visit (INDEPENDENT_AMBULATORY_CARE_PROVIDER_SITE_OTHER): Payer: Medicare Other | Admitting: Family

## 2012-12-24 DIAGNOSIS — I4891 Unspecified atrial fibrillation: Secondary | ICD-10-CM

## 2012-12-24 LAB — POCT INR: INR: 3.6

## 2012-12-24 NOTE — Patient Instructions (Addendum)
Hold Coumadin today. Then same dose,5 mg on mondays, Weds, and Friday. All other days 7.5mg .   Anticoagulation Dose Instructions as of 12/24/2012     Glynis Smiles Tue Wed Thu Fri Sat   New Dose 7.5 mg 5 mg 7.5 mg 5 mg 7.5 mg 5 mg 7.5 mg    Description       Hold Coumadin today. Then same dose,5 mg on mondays, Weds, and Friday. All other days 7.5mg .

## 2013-01-14 ENCOUNTER — Telehealth: Payer: Self-pay | Admitting: *Deleted

## 2013-01-14 ENCOUNTER — Ambulatory Visit (INDEPENDENT_AMBULATORY_CARE_PROVIDER_SITE_OTHER): Payer: Medicare Other | Admitting: Family

## 2013-01-14 DIAGNOSIS — Z7901 Long term (current) use of anticoagulants: Secondary | ICD-10-CM

## 2013-01-14 DIAGNOSIS — I4891 Unspecified atrial fibrillation: Secondary | ICD-10-CM

## 2013-01-14 LAB — POCT INR: INR: 3

## 2013-01-14 NOTE — Telephone Encounter (Signed)
Message copied by Jimmye Norman on Tue Jan 14, 2013  4:31 PM ------      Message from: Aniceto Boss A      Created: Tue Jan 14, 2013  4:05 PM                   ----- Message -----         From: Gordy Savers, MD         Sent: 01/14/2013  10:41 AM           To: Angelena Form, CMA            Please ask Mr. Netta Cedars to schedule a followup appointment       ----- Message -----         From: Angelena Form, CMA         Sent: 01/14/2013   8:30 AM           To: Gordy Savers, MD, Jimmye Norman, LPN            Good morning! I saw Mr. Ofarrell in coumadin clinic this morning and he requested that I send you a message about one of his medications. He believes the medication that he was put on to slow his heart rate down is making him extremely tired all the time, and he would like to know what he can do to fix this. Please call pt       ------

## 2013-01-14 NOTE — Patient Instructions (Addendum)
Continue same dose,5 mg on mondays, Weds, and Friday. All other days 7.5mg . Recheck in 4 weeks   Anticoagulation Dose Instructions as of 01/14/2013     Glynis Smiles Tue Wed Thu Fri Sat   New Dose 7.5 mg 5 mg 7.5 mg 5 mg 7.5 mg 5 mg 7.5 mg    Description       Continue same dose,5 mg on mondays, Weds, and Friday. All other days 7.5mg . Recheck in 4 weeks

## 2013-01-14 NOTE — Telephone Encounter (Signed)
Spoke to pt told him Dr.K would like to see him this week. Pt verbalized understanding and transferred to scheduling.

## 2013-01-16 ENCOUNTER — Encounter: Payer: Self-pay | Admitting: Internal Medicine

## 2013-01-16 ENCOUNTER — Ambulatory Visit (INDEPENDENT_AMBULATORY_CARE_PROVIDER_SITE_OTHER): Payer: Medicare Other | Admitting: Internal Medicine

## 2013-01-16 VITALS — BP 130/80 | HR 94 | Temp 97.5°F | Resp 20 | Wt 256.0 lb

## 2013-01-16 DIAGNOSIS — I10 Essential (primary) hypertension: Secondary | ICD-10-CM | POA: Diagnosis not present

## 2013-01-16 DIAGNOSIS — E785 Hyperlipidemia, unspecified: Secondary | ICD-10-CM

## 2013-01-16 DIAGNOSIS — R002 Palpitations: Secondary | ICD-10-CM | POA: Diagnosis not present

## 2013-01-16 DIAGNOSIS — I4891 Unspecified atrial fibrillation: Secondary | ICD-10-CM | POA: Diagnosis not present

## 2013-01-16 DIAGNOSIS — R5383 Other fatigue: Secondary | ICD-10-CM | POA: Diagnosis not present

## 2013-01-16 DIAGNOSIS — R5381 Other malaise: Secondary | ICD-10-CM | POA: Diagnosis not present

## 2013-01-16 LAB — COMPREHENSIVE METABOLIC PANEL
ALT: 21 U/L (ref 0–53)
AST: 20 U/L (ref 0–37)
Albumin: 4.1 g/dL (ref 3.5–5.2)
Alkaline Phosphatase: 75 U/L (ref 39–117)
BUN: 20 mg/dL (ref 6–23)
CO2: 28 mEq/L (ref 19–32)
Calcium: 9.1 mg/dL (ref 8.4–10.5)
Chloride: 104 mEq/L (ref 96–112)
Creatinine, Ser: 1.2 mg/dL (ref 0.4–1.5)
GFR: 64.57 mL/min (ref >60.00)
Glucose, Bld: 91 mg/dL (ref 70–99)
Potassium: 4.6 mEq/L (ref 3.5–5.1)
Sodium: 139 mEq/L (ref 135–145)
Total Bilirubin: 0.9 mg/dL (ref 0.3–1.2)
Total Protein: 7.1 g/dL (ref 6.0–8.3)

## 2013-01-16 LAB — CBC WITH DIFFERENTIAL/PLATELET
Basophils Absolute: 0 10*3/uL (ref 0.0–0.1)
Basophils Relative: 0.4 % (ref 0.0–3.0)
Eosinophils Absolute: 0.1 10*3/uL (ref 0.0–0.7)
Eosinophils Relative: 1.6 % (ref 0.0–5.0)
HCT: 51.7 % (ref 39.0–52.0)
Hemoglobin: 17.3 g/dL — ABNORMAL HIGH (ref 13.0–17.0)
Lymphocytes Relative: 27.3 % (ref 12.0–46.0)
Lymphs Abs: 2.2 10*3/uL (ref 0.7–4.0)
MCHC: 33.4 g/dL (ref 30.0–36.0)
MCV: 94.4 fl (ref 78.0–100.0)
Monocytes Absolute: 0.8 10*3/uL (ref 0.1–1.0)
Monocytes Relative: 9.6 % (ref 3.0–12.0)
Neutro Abs: 4.9 10*3/uL (ref 1.4–7.7)
Neutrophils Relative %: 61.1 % (ref 43.0–77.0)
Platelets: 189 10*3/uL (ref 150.0–400.0)
RBC: 5.47 Mil/uL (ref 4.22–5.81)
RDW: 13.5 % (ref 11.5–14.6)
WBC: 8.1 10*3/uL (ref 4.5–10.5)

## 2013-01-16 LAB — TSH: TSH: 0.83 u[IU]/mL (ref 0.35–5.50)

## 2013-01-16 LAB — SEDIMENTATION RATE: Sed Rate: 1 mm/hr (ref 0–22)

## 2013-01-16 NOTE — Patient Instructions (Signed)
Limit your sodium (Salt) intake    It is important that you exercise regularly, at least 20 minutes 3 to 4 times per week.  If you develop chest pain or shortness of breath seek  medical attention.  You need to lose weight.  Consider a lower calorie diet and regular exercise. 

## 2013-01-16 NOTE — Progress Notes (Signed)
Subjective:    Patient ID: Glen [name], male    DOB: November 14, 1927, 79 y.o.   MRN: [id number]  HPI  79 year old patient who is seen today for followup. He has a history of dyslipidemia obesity hypertension and atrial fibrillation. Remains on chronic Coumadin anticoagulation. For the past few weeks he has had increasing fatigue. His cardiopulmonary status appears to be fairly stable. No significant change in weight  Wt Readings from Last 3 Encounters:  01/16/13 256 lb (116.121 kg)  10/16/12 252 lb (114.306 kg)  06/17/12 250 lb 13.6 oz (113.785 kg)   Past Medical History  Diagnosis Date  . Arrhythmia   . Drug therapy     Anticoagulation Therapy  . Palpitation   . Decreased left ventricular function   . Hypertension   . Hyperlipidemia   . Obesity   . Neoplasm of prostate     special screening malignant   . Diverticulitis   . History of colonic polyps   . FHx: colon cancer   . Family history of diabetes insipidus     History   Social History  . Marital Status: Married    Spouse Name: N/A    Number of Children: N/A  . Years of Education: N/A   Occupational History  . Retired    Social History Main Topics  . Smoking status: Never Smoker   . Smokeless tobacco: Never Used  . Alcohol Use: No  . Drug Use: Not on file  . Sexually Active: Not on file   Other Topics Concern  . Not on file   Social History Narrative   Regular exercise: Yes: [employer] 4 times/week    Past Surgical History  Procedure Laterality Date  . Colonoscopy  December 2005  . Anal fistula repair    . Knee arthroscopy      Right  . Tonsillectomy    . Ett  1991    Negative  . Cataract extraction  2004  . Cardiac valve surgery  1996    status post balloon valvuloplasty at [facility]  . 2-d echocardiogram  November 2009, 2011    Family History  Problem Relation Age of Onset  . Colon cancer    . Diabetes    . Prostate cancer Brother   . Heart disease    . Heart attack Father   . Coronary artery  disease      Two siblings, status post stenting to siblings with heart disease. One. Status post pacemaker insertion.    Allergies  Allergen Reactions  . Codeine     Current Outpatient Prescriptions on File Prior to Visit  Medication Sig Dispense Refill  . acetaminophen (TYLENOL) 500 MG tablet Take 1,000 mg by mouth every 6 (six) hours as needed.      . diltiazem (TIAZAC) 360 MG 24 hr capsule Take 1 capsule (360 mg total) by mouth daily.  90 capsule  6  . warfarin (COUMADIN) 5 MG tablet Take 1 tablet (5 mg total) by mouth daily. Or as directed by doctor  180 tablet  3   No current facility-administered medications on file prior to visit.    BP 130/80  Pulse 94  Temp(Src) 97.5 F (36.4 C) (Oral)  Resp 20  Wt 256 lb (116.121 kg)  BMI 33.78 kg/m2  SpO2 97%      Review of Systems  Constitutional: Positive for fatigue. Negative for fever, chills and appetite change.  HENT: Negative for hearing loss, ear pain, congestion, sore throat, trouble swallowing, neck stiffness,  dental problem, voice change and tinnitus.   Eyes: Negative for pain, discharge and visual disturbance.  Respiratory: Negative for cough, chest tightness, wheezing and stridor.   Cardiovascular: Negative for chest pain, palpitations and leg swelling.  Gastrointestinal: Negative for nausea, vomiting, abdominal pain, diarrhea, constipation, blood in stool and abdominal distention.  Genitourinary: Negative for urgency, hematuria, flank pain, discharge, difficulty urinating and genital sores.  Musculoskeletal: Negative for myalgias, back pain, joint swelling, arthralgias and gait problem.  Skin: Negative for rash.  Neurological: Negative for dizziness, syncope, speech difficulty, weakness, numbness and headaches.  Hematological: Negative for adenopathy. Does not bruise/bleed easily.  Psychiatric/Behavioral: Negative for behavioral problems and dysphoric mood. The patient is not nervous/anxious.        Objective:    Physical Exam  Constitutional: He is oriented to person, place, and time. He appears well-developed.  HENT:  Head: Normocephalic.  Right Ear: External ear normal.  Left Ear: External ear normal.  Eyes: Conjunctivae and EOM are normal.  Neck: Normal range of motion.  Cardiovascular: Normal rate and normal heart sounds.   Pulmonary/Chest: Breath sounds normal.  Abdominal: Bowel sounds are normal.  Musculoskeletal: Normal range of motion. He exhibits no edema and no tenderness.  Neurological: He is alert and oriented to person, place, and time.  Psychiatric: He has a normal mood and affect. His behavior is normal.          Assessment & Plan:    Fatigue; unremarkable clinical exam. We'll check some updated lab Hypertension stable Atrial fibrillation. Continue chronic Coumadin anticoagulation  Recheck 1 month or as needed

## 2013-01-28 ENCOUNTER — Telehealth: Payer: Self-pay | Admitting: Internal Medicine

## 2013-01-28 NOTE — Telephone Encounter (Signed)
Patient is calling in to day regarding his medication - Dilitiazem 360mg  daily.  He went to see a Periodonitist today regarding mouth and gum infection issues.  The Dentist told him that the Dilitiazem was what was causing his issues and it needed to be changed.  Dentist is Dr. Filbert Berthold - [phone].  Patient states that Dr.Kwiatkowski and Dr. Daleen Squibb, prescribe and maintain his Dilitazem and would like a follow up and change in medication.  Advised patient I would send his concerns to Dr. Amador Cunas for review.  Please contact patient. [phone]

## 2013-01-28 NOTE — Telephone Encounter (Signed)
Please patient to discontinue diltiazem; please call in a new prescription for generic Toprol XL 50 mg #60 one daily. Please have patient followup in the office next week

## 2013-01-29 ENCOUNTER — Encounter: Payer: Self-pay | Admitting: Internal Medicine

## 2013-01-29 MED ORDER — METOPROLOL SUCCINATE ER 50 MG PO TB24: 50.0000 mg | ORAL_TABLET | Freq: Every day | ORAL | Status: DC

## 2013-01-29 NOTE — Telephone Encounter (Signed)
Spoke to pt told him Dr. Amador Cunas wants him to stop Diltiazem and start new Rx for Toprol XL 50 mg one tablet daily, sent to pharmacy. Pt verbalized understanding.

## 2013-02-03 ENCOUNTER — Encounter: Payer: Self-pay | Admitting: Internal Medicine

## 2013-02-06 ENCOUNTER — Encounter: Payer: Self-pay | Admitting: Internal Medicine

## 2013-02-06 ENCOUNTER — Ambulatory Visit (INDEPENDENT_AMBULATORY_CARE_PROVIDER_SITE_OTHER): Payer: Medicare Other | Admitting: Internal Medicine

## 2013-02-06 VITALS — BP 112/70 | HR 76 | Temp 97.5°F | Resp 22 | Wt 254.0 lb

## 2013-02-06 DIAGNOSIS — R002 Palpitations: Secondary | ICD-10-CM | POA: Diagnosis not present

## 2013-02-06 DIAGNOSIS — I10 Essential (primary) hypertension: Secondary | ICD-10-CM

## 2013-02-06 DIAGNOSIS — I4891 Unspecified atrial fibrillation: Secondary | ICD-10-CM | POA: Diagnosis not present

## 2013-02-06 MED ORDER — METOPROLOL SUCCINATE ER 50 MG PO TB24: ORAL_TABLET | ORAL | Status: DC

## 2013-02-06 NOTE — Progress Notes (Signed)
Subjective:    Patient ID: Glen [name], male    DOB: 23-Apr-1928, 79 y.o.   MRN: [id number]  HPI  79 year old patient who is seen today in followup. He has a history of permanent atrial fibrillation and has been on chronic Coumadin anticoagulation.  He has been evaluated for periodontal disease and it has been felt that diltiazem was a major factor.  Verapamil was considered but after review it was noted to have even a higher incidence of gingival hyperplasia.  Metoprolol 50 mg daily was substituted and more recently decreased to 25 mg daily. He seemed to have some worsening of fatigue on the higher dose.  He seems to be doing better on the 25 mg dose although has been on this only for 3 days.  Past Medical History  Diagnosis Date  . Arrhythmia   . Drug therapy     Anticoagulation Therapy  . Palpitation   . Decreased left ventricular function   . Hypertension   . Hyperlipidemia   . Obesity   . Neoplasm of prostate     special screening malignant   . Diverticulitis   . History of colonic polyps   . FHx: colon cancer   . Family history of diabetes insipidus     History   Social History  . Marital Status: Married    Spouse Name: N/A    Number of Children: N/A  . Years of Education: N/A   Occupational History  . Retired    Social History Main Topics  . Smoking status: Never Smoker   . Smokeless tobacco: Never Used  . Alcohol Use: No  . Drug Use: Not on file  . Sexually Active: Not on file   Other Topics Concern  . Not on file   Social History Narrative   Regular exercise: Yes: [employer] 4 times/week    Past Surgical History  Procedure Laterality Date  . Colonoscopy  December 2005  . Anal fistula repair    . Knee arthroscopy      Right  . Tonsillectomy    . Ett  1991    Negative  . Cataract extraction  2004  . Cardiac valve surgery  1996    status post balloon valvuloplasty at [facility]  . 2-d echocardiogram  November 2009, 2011    Family History  Problem Relation  Age of Onset  . Colon cancer    . Diabetes    . Prostate cancer Brother   . Heart disease    . Heart attack Father   . Coronary artery disease      Two siblings, status post stenting to siblings with heart disease. One. Status post pacemaker insertion.    Allergies  Allergen Reactions  . Codeine     Current Outpatient Prescriptions on File Prior to Visit  Medication Sig Dispense Refill  . acetaminophen (TYLENOL) 500 MG tablet Take 1,000 mg by mouth every 6 (six) hours as needed.      . warfarin (COUMADIN) 5 MG tablet Take 1 tablet (5 mg total) by mouth daily. Or as directed by doctor  180 tablet  3  . metoprolol succinate (TOPROL-XL) 50 MG 24 hr tablet Take 1 tablet (50 mg total) by mouth daily. Take with or immediately following a meal.  60 tablet  0   No current facility-administered medications on file prior to visit.    BP 112/70  Pulse 76  Temp(Src) 97.5 F (36.4 C) (Oral)  Resp 22  Wt 254 lb (115.214 kg)  BMI 33.52 kg/m2  SpO2 95%       Review of Systems  Constitutional: Positive for fatigue. Negative for fever, chills and appetite change.  HENT: Negative for hearing loss, ear pain, congestion, sore throat, trouble swallowing, neck stiffness, dental problem, voice change and tinnitus.   Eyes: Negative for pain, discharge and visual disturbance.  Respiratory: Negative for cough, chest tightness, wheezing and stridor.   Cardiovascular: Negative for chest pain, palpitations and leg swelling.  Gastrointestinal: Negative for nausea, vomiting, abdominal pain, diarrhea, constipation, blood in stool and abdominal distention.  Genitourinary: Negative for urgency, hematuria, flank pain, discharge, difficulty urinating and genital sores.  Musculoskeletal: Negative for myalgias, back pain, joint swelling, arthralgias and gait problem.  Skin: Negative for rash.  Neurological: Negative for dizziness, syncope, speech difficulty, weakness, numbness and headaches.   Hematological: Negative for adenopathy. Does not bruise/bleed easily.  Psychiatric/Behavioral: Negative for behavioral problems and dysphoric mood. The patient is not nervous/anxious.        Objective:   Physical Exam  Constitutional: He is oriented to person, place, and time. He appears well-developed.  HENT:  Head: Normocephalic.  Right Ear: External ear normal.  Left Ear: External ear normal.  Eyes: Conjunctivae and EOM are normal.  Neck: Normal range of motion.  Cardiovascular: Normal rate and normal heart sounds.   Irregular rhythm with rate of 80-90  Pulmonary/Chest: Breath sounds normal.  A few bibasilar crackles  O2 saturation 95  Abdominal: Bowel sounds are normal.  Musculoskeletal: Normal range of motion. He exhibits no edema and no tenderness.  Neurological: He is alert and oriented to person, place, and time.  Psychiatric: He has a normal mood and affect. His behavior is normal.          Assessment & Plan:   Permanent atrial fibrillation. We'll continue present 25 mg dose of metoprolol succinate. He seems to be tolerating this dose reasonably well. If fatigue continues to be an issue, might consider Lanoxin as rate control drug.  Will schedule to see cardiology in followup Chronic Coumadin anticoagulation

## 2013-02-06 NOTE — Patient Instructions (Signed)
Limit your sodium (Salt) intake  You need to lose weight.  Consider a lower calorie diet and regular exercise.  Cardiology followup with Dr. Daleen Squibb  Return in 6 months for follow-up

## 2013-02-11 ENCOUNTER — Ambulatory Visit (INDEPENDENT_AMBULATORY_CARE_PROVIDER_SITE_OTHER): Payer: Medicare Other | Admitting: Family

## 2013-02-11 DIAGNOSIS — I4891 Unspecified atrial fibrillation: Secondary | ICD-10-CM

## 2013-02-11 LAB — POCT INR: INR: 3.5

## 2013-02-11 NOTE — Patient Instructions (Addendum)
Hold Coumadin today. Decrease dose. 7.5 mg on Tuesdays, Thursdays, and Saturdays. All other days 5mg . Recheck in 3 weeks  Anticoagulation Dose Instructions as of 02/11/2013     Glynis Smiles Tue Wed Thu Fri Sat   New Dose 5 mg 5 mg 7.5 mg 5 mg 7.5 mg 5 mg 7.5 mg    Description       Hold Coumadin today. Decrease dose. 7.5 mg on Tuesdays, Thursdays, and Saturdays. All other days 5mg . Recheck in 3 weeks

## 2013-02-24 ENCOUNTER — Encounter: Payer: Self-pay | Admitting: Internal Medicine

## 2013-02-28 ENCOUNTER — Ambulatory Visit (INDEPENDENT_AMBULATORY_CARE_PROVIDER_SITE_OTHER): Payer: Medicare Other | Admitting: Physician Assistant

## 2013-02-28 ENCOUNTER — Encounter: Payer: Self-pay | Admitting: Physician Assistant

## 2013-02-28 VITALS — BP 122/70 | HR 124 | Ht 73.0 in | Wt 254.4 lb

## 2013-02-28 DIAGNOSIS — I10 Essential (primary) hypertension: Secondary | ICD-10-CM

## 2013-02-28 DIAGNOSIS — I4891 Unspecified atrial fibrillation: Secondary | ICD-10-CM

## 2013-02-28 MED ORDER — ATENOLOL 50 MG PO TABS: 50.0000 mg | ORAL_TABLET | Freq: Every day | ORAL | Status: DC

## 2013-02-28 MED ORDER — PROPRANOLOL HCL ER 80 MG PO CP24: 80.0000 mg | ORAL_CAPSULE | Freq: Every day | ORAL | Status: DC

## 2013-02-28 MED ORDER — NADOLOL 40 MG PO TABS: 40.0000 mg | ORAL_TABLET | Freq: Every day | ORAL | Status: DC

## 2013-02-28 MED ORDER — METOPROLOL TARTRATE 25 MG PO TABS: 25.0000 mg | ORAL_TABLET | Freq: Two times a day (BID) | ORAL | Status: DC

## 2013-02-28 NOTE — Progress Notes (Signed)
[address] [place], [place]  [address] Phone: [phone] Fax:  [phone]  Date:  02/28/2013   ID:  Glen [name], DOB 27-Aug-1928, MRN [id number]  PCP:  Rogelia Boga, MD  Cardiologist:  Dr. Valera Castle     History of Present Illness: Glen [name] is a 79 y.o. male who presents for follow up on AFib.  He has a hx of permanent AFib, HTN, HL.  CHADS2=2.  Coumadin is followed by PCP.  Echo 5/11:  EF 55-60%, mild dilated ascending aorta, mod LAE.  Last seen by Dr. Valera Castle 06/2012.  He has been treated with a strategy of rate control with diltiazem.  He was recently seen by his dentist and was told that Diltiazem is causing significant periodontal disease.  This was stopped.  Verapamil was reviewed by Rogelia Boga, MD and noted to have same effects.  Patient was switched to Toprol but has had significant side effects of fatigue, belching and chills.  Fatigue improved at lower doses of Toprol.  Symptoms have resolved off the medication.  Patient denies chest pain, dyspnea, syncope, orthopnea, PND, edema.  He reports NYHA Class II-IIb symptoms.    Labs (4/14):  K 4.6, Cr 1.2, ALT 21, Hgb 17.3, TSH 0.83  Wt Readings from Last 3 Encounters:  02/28/13 254 lb 6.4 oz (115.395 kg)  02/06/13 254 lb (115.214 kg)  01/16/13 256 lb (116.121 kg)     Past Medical History  Diagnosis Date  . Atrial fibrillation   . Drug therapy     Anticoagulation Therapy  . Palpitation   . Hx of echocardiogram     a. Echo 5/11:  EF 55-60%, mild dilated ascending aorta, mod LAE  . Hypertension   . Hyperlipidemia   . Obesity   . Neoplasm of prostate     special screening malignant   . Diverticulitis   . History of colonic polyps   . FHx: colon cancer   . Family history of diabetes insipidus     Current Outpatient Prescriptions  Medication Sig Dispense Refill  . acetaminophen (TYLENOL) 500 MG tablet Take 1,000 mg by mouth every 6 (six) hours as needed.        . warfarin (COUMADIN) 5 MG tablet Take 1 tablet (5 mg total) by mouth daily. Or as directed by doctor  180 tablet  3  . metoprolol succinate (TOPROL-XL) 50 MG 24 hr tablet 1/2 tablet daily following a meal.  60 tablet  0   No current facility-administered medications for this visit.    Allergies:    Allergies  Allergen Reactions  . Codeine     Social History:  The patient  reports that he has never smoked. He has never used smokeless tobacco. He reports that he does not drink alcohol.   ROS:  Please see the history of present illness.   He notes a hx of difficulty in swallowing.  All other systems reviewed and negative.   PHYSICAL EXAM: VS:  BP 122/70  Pulse 124  Ht 6\' 1"  (1.854 m)  Wt 254 lb 6.4 oz (115.395 kg)  BMI 33.57 kg/m2  SpO2 97% Well nourished, well developed, in no acute distress HEENT: normal Neck: no JVD Cardiac:  normal S1, S2; irregularly irregular rhythm; no murmur Lungs:  clear to auscultation bilaterally, no wheezing, rhonchi or rales Abd: soft, nontender, no hepatomegaly Ext: no edema Skin: warm and dry Neuro:  CNs 2-12 intact, no focal abnormalities noted  EKG:  AFib,  HR 124     ASSESSMENT AND PLAN:  1. Atrial Fibrillation:  Rate uncontrolled now that he is off all rate controlling drugs.  He cannot take dihydropine CCB due to gingival hyperplasia.  He has been intolerant to Toprol XL.  Digoxin would not be indicated in the setting of normal LVF.  I will give him 4 different beta blockers to try and see which one he can tolerate.  Once we find which formulation he can take, we will titrate for heart rate control.   2. Hypertension:  Controlled. 3. Disposition:  F/u with me in 1 month.   Signed, Tereso Newcomer, PA-C  02/28/2013 11:59 AM

## 2013-02-28 NOTE — Patient Instructions (Addendum)
PLEASE FOLLOW UP WITH SCOTT WEAVER, PAC IN 1 MONTH 03/31/13 @ 8:30 AM  YOU HAVE BEEN GIVEN 4 DIFFERENT PRESCRIPTIONS TO TRY FOR 2 WEEKS EACH,   TRY METOPROLOL TART 25 MG TWICE DAILY FOR 2 WEEKS; IF YOU DO NOT FEEL BETTER THEN TRY ONE OF THE OTHER MEDICATIONS PRESCRIBED TODAY  TRY ATENOLOL 50 MG DAILY FOR 2 WEEKS;  IF YOU DO NOT FEEL BETTER THEN TRY ONE OF THE OTHER MEDICATIONS PRESCRIBED TODAY  TRY PROPRANOLOL ER 80 MG DAILY FOR 2 WEEKS;  IF YOU DO NOT FEEL BETTER THEN TRY ONE OF THE OTHER MEDICATIONS PRESCRIBED TODAY  TRY NADOLOL 40 MG DAILY FOR 2 WEEKS;  IF YOU DO NOT FEEL BETTER THEN TRY ONE OF THE OTHER 3 MEDICATIONS PRESCRIBED TODAY

## 2013-03-01 ENCOUNTER — Encounter: Payer: Self-pay | Admitting: Internal Medicine

## 2013-03-03 ENCOUNTER — Telehealth: Payer: Self-pay | Admitting: *Deleted

## 2013-03-03 NOTE — Telephone Encounter (Signed)
Spoke to pt told him Dr. Amador Cunas is out all week, need to follow cardiologist recommendations. Pt verbalized understanding. Asked pt if taking anything for reflux? Pt stated no discussed with Dr. Frederica Kuster but never started anything, has just started taking Probiotic for digestive system. Told pt okay try Probiotic for a week and I will discuss with Dr.K when he returns and get back to you. Pt verbalized understanding.

## 2013-03-04 ENCOUNTER — Ambulatory Visit (INDEPENDENT_AMBULATORY_CARE_PROVIDER_SITE_OTHER): Payer: Medicare Other | Admitting: Family

## 2013-03-04 DIAGNOSIS — I4891 Unspecified atrial fibrillation: Secondary | ICD-10-CM | POA: Diagnosis not present

## 2013-03-04 LAB — POCT INR
INR: 2.8
INR: 2.9

## 2013-03-04 NOTE — Patient Instructions (Addendum)
7.5 mg on Tuesdays, Thursdays, and Saturdays. All other days 5mg . Recheck in 4 weeks Anticoagulation Dose Instructions as of 03/04/2013     Glen [name] Tue Wed Thu Fri Sat   New Dose 5 mg 5 mg 7.5 mg 5 mg 7.5 mg 5 mg 7.5 mg    Description        7.5 mg on Tuesdays, Thursdays, and Saturdays. All other days 5mg . Recheck in 4 weeks

## 2013-03-05 ENCOUNTER — Telehealth: Payer: Self-pay | Admitting: *Deleted

## 2013-03-05 ENCOUNTER — Encounter: Payer: Self-pay | Admitting: Physician Assistant

## 2013-03-05 NOTE — Telephone Encounter (Signed)
Pt sent mychart advice this am for advice on medication he was prescribed to help control his rate.  States he awoke this morning and "my heart felt like it was racing" According to pt it is better now. He is concerned that the Atenolol 50mg  he started on 03/01/13 will not control his heartrate. He is also going out of town in another week.  Pt saw Scott on 02/28/13 and would like Dr. Daleen Squibb to review over medication choices to help him find what will best control his atrial fib heartrate.   I will forward this to Dr. Daleen Squibb to review. Mylo Red RN

## 2013-03-06 MED ORDER — ATENOLOL 50 MG PO TABS: 50.0000 mg | ORAL_TABLET | Freq: Every day | ORAL | Status: DC

## 2013-03-06 NOTE — Telephone Encounter (Signed)
Has he tried it? If not I certainly would give it a try. It should help control his heart rate at that dose. Its also once a day.

## 2013-03-06 NOTE — Telephone Encounter (Signed)
I spoke with Glen [name] and he is doing well today no "heart racing" events noted. Dr. Daleen Squibb reviewed & agreed Glen [name] should stay on Atenolol 50mg  daily as it is controlling his heart rate.  Glen [name] agrees and will keep follow-up with Scott at the end of this month. Refill prescription for Atenolol sent in for Glen [name].  Mylo Red RN

## 2013-03-10 ENCOUNTER — Telehealth: Payer: Self-pay | Admitting: *Deleted

## 2013-03-10 ENCOUNTER — Encounter: Payer: Self-pay | Admitting: Cardiology

## 2013-03-10 NOTE — Telephone Encounter (Signed)
Patient had called and said prescription was not filled at [facility] even though it was filled electronically last week. I called Atenolol 50 mg #30 with 6 refills to [facility]. Patient notified by e-mail.

## 2013-03-11 NOTE — Telephone Encounter (Signed)
Spoke to pt asked how he was doing on the Probiotic and his reflux? Pt stated he is doing better, taking Probiotic in the morning and is taking another gas relief tablet after dinner does not know name. Asked pt if he wants Dr. Kirtland Bouchard to prescribe something else for reflux or continue what he is doing being as he is feeling better. Pt stated he will continue what he is doing. I have a follow up appt in July. Told pt okay will see him then.

## 2013-03-31 ENCOUNTER — Ambulatory Visit (INDEPENDENT_AMBULATORY_CARE_PROVIDER_SITE_OTHER): Payer: Medicare Other | Admitting: Physician Assistant

## 2013-03-31 ENCOUNTER — Encounter: Payer: Self-pay | Admitting: Physician Assistant

## 2013-03-31 VITALS — BP 108/71 | HR 112 | Ht 73.0 in | Wt 251.0 lb

## 2013-03-31 DIAGNOSIS — I10 Essential (primary) hypertension: Secondary | ICD-10-CM

## 2013-03-31 DIAGNOSIS — I4891 Unspecified atrial fibrillation: Secondary | ICD-10-CM

## 2013-03-31 MED ORDER — ATENOLOL 50 MG PO TABS
75.0000 mg | ORAL_TABLET | Freq: Every day | ORAL | Status: DC
Start: 2013-03-31 — End: 2013-12-22

## 2013-03-31 NOTE — Patient Instructions (Addendum)
HOLTER 24 HOUR THIS IS TO BE DONE 2-3 WEEKS AFTER INCREASE ON ATENOLOL AS OF 03/31/13  INCREASE ATENOLOL TO 75 MG DAILY; A REFILL HAS BEEN SENT IN TODAY FOR YOU  PLEASE FOLLOW UP WITH DR. Patty Sermons IN 06/2013

## 2013-03-31 NOTE — Progress Notes (Signed)
[address], [address] [place], [place]  [address] Phone: [phone] Fax:  [phone]  Date:  03/31/2013   ID:  Glen [name], DOB 15-Dec-1927, MRN [id number]  PCP:  Rogelia Boga, MD  Cardiologist:  Dr. Valera Castle     History of Present Illness: Glen [name] is a 79 y.o. male who presents for follow up on AFib.    He has a hx of permanent AFib, HTN, HL.  CHADS2=2.  Coumadin is followed by PCP.  Echo 5/11:  EF 55-60%, mild dilated ascending aorta, mod LAE.  His A. fib has been treated with a strategy of rate control with diltiazem.  He was recently seen by his dentist and was told that Diltiazem is causing significant periodontal disease.  This was stopped.  Verapamil was reviewed by Rogelia Boga, MD and noted to have same effects.  Patient was switched to Toprol but has had significant side effects of fatigue, belching and chills.  Fatigue improved at lower doses of Toprol.  Symptoms resolved off the medication.  I saw him several weeks ago. I gave him several different beta blocker to try. He started on atenolol and has had no side effects. He denies chest pain, significant dyspnea, syncope, orthopnea, PND or edema. He is limited by right knee pain from DJD.  Labs (4/14):  K 4.6, Cr 1.2, ALT 21, Hgb 17.3, TSH 0.83  Wt Readings from Last 3 Encounters:  02/28/13 254 lb 6.4 oz (115.395 kg)  02/06/13 254 lb (115.214 kg)  01/16/13 256 lb (116.121 kg)     Past Medical History  Diagnosis Date  . Atrial fibrillation   . Drug therapy     Anticoagulation Therapy  . Palpitation   . Hx of echocardiogram     a. Echo 5/11:  EF 55-60%, mild dilated ascending aorta, mod LAE  . Hypertension   . Hyperlipidemia   . Obesity   . Neoplasm of prostate     special screening malignant   . Diverticulitis   . History of colonic polyps   . FHx: colon cancer   . Family history of diabetes insipidus     Current Outpatient Prescriptions  Medication Sig Dispense  Refill  . acetaminophen (TYLENOL) 500 MG tablet Take 1,000 mg by mouth every 6 (six) hours as needed.      Marland Kitchen atenolol (TENORMIN) 50 MG tablet Take 1 tablet (50 mg total) by mouth daily. TRY FOR 2 WEEKS  30 tablet  5  . metoprolol tartrate (LOPRESSOR) 25 MG tablet Take 1 tablet (25 mg total) by mouth 2 (two) times daily. TRY FOR 2 WEEKS  28 tablet  1  . nadolol (CORGARD) 40 MG tablet Take 1 tablet (40 mg total) by mouth daily. TRY FOR 2 WEEKS  14 tablet  1  . propranolol ER (INDERAL LA) 80 MG 24 hr capsule Take 1 capsule (80 mg total) by mouth daily. TRY FOR 2 WEEKS  14 capsule  1  . warfarin (COUMADIN) 5 MG tablet Take 1 tablet (5 mg total) by mouth daily. Or as directed by doctor  180 tablet  3   No current facility-administered medications for this visit.    Allergies:    Allergies  Allergen Reactions  . Codeine     Social History:  The patient  reports that he has never smoked. He has never used smokeless tobacco. He reports that he does not drink alcohol.   ROS:  Please see the history of present illness.  All other systems reviewed and negative.   PHYSICAL EXAM: VS:  BP 108/71  Pulse 112  Ht 6\' 1"  (1.854 m)  Wt 251 lb (113.853 kg)  BMI 33.12 kg/m2 Well nourished, well developed, in no acute distress HEENT: normal Neck: no JVD Cardiac:  normal S1, S2; irregularly irregular rhythm; no murmur Lungs:  clear to auscultation bilaterally, no wheezing, rhonchi or rales Abd: soft, nontender, no hepatomegaly Ext: no edema Skin: warm and dry Neuro:  CNs 2-12 intact, no focal abnormalities noted  EKG:  Atrial fibrillation, HR 112   ASSESSMENT AND PLAN:  1. Atrial Fibrillation:  Rate somewhat better controlled. Blood pressure is somewhat soft to titrate his atenolol much further. We will try to increase this to 75 mg daily. Hopefully, he can tolerate this. He has normal LV function and I would prefer to avoid digoxin. I will have him undergo a 24-hour Holter monitor in a few weeks  to assess his heart rate control on the current dose of atenolol. 2. Hypertension:  Controlled. 3. Disposition:  F/u with Dr. Cassell Clement in 06/2013 as  planned.   Signed, Tereso Newcomer, PA-C  03/31/2013 8:12 AM

## 2013-04-01 ENCOUNTER — Ambulatory Visit (INDEPENDENT_AMBULATORY_CARE_PROVIDER_SITE_OTHER): Payer: Medicare Other | Admitting: Family

## 2013-04-01 DIAGNOSIS — I4891 Unspecified atrial fibrillation: Secondary | ICD-10-CM | POA: Diagnosis not present

## 2013-04-01 LAB — POCT INR: INR: 2.9

## 2013-04-01 NOTE — Patient Instructions (Addendum)
7.5 mg on Tuesdays, Thursdays, and Saturdays. All other days 5mg . Recheck in 4 weeks  Anticoagulation Dose Instructions as of 04/01/2013     Glynis Smiles Tue Wed Thu Fri Sat   New Dose 5 mg 5 mg 7.5 mg 5 mg 7.5 mg 5 mg 7.5 mg    Description        7.5 mg on Tuesdays, Thursdays, and Saturdays. All other days 5mg . Recheck in 4 weeks

## 2013-04-16 ENCOUNTER — Encounter: Payer: Self-pay | Admitting: Internal Medicine

## 2013-04-16 ENCOUNTER — Ambulatory Visit (INDEPENDENT_AMBULATORY_CARE_PROVIDER_SITE_OTHER): Payer: Medicare Other | Admitting: Internal Medicine

## 2013-04-16 VITALS — BP 128/80 | HR 76 | Temp 97.6°F | Resp 20 | Ht 72.0 in | Wt 257.0 lb

## 2013-04-16 DIAGNOSIS — Z860100 Personal history of colon polyps, unspecified: Secondary | ICD-10-CM

## 2013-04-16 DIAGNOSIS — I10 Essential (primary) hypertension: Secondary | ICD-10-CM | POA: Diagnosis not present

## 2013-04-16 DIAGNOSIS — E785 Hyperlipidemia, unspecified: Secondary | ICD-10-CM | POA: Diagnosis not present

## 2013-04-16 DIAGNOSIS — Z7901 Long term (current) use of anticoagulants: Secondary | ICD-10-CM

## 2013-04-16 DIAGNOSIS — Z0000 Encounter for general adult medical examination without abnormal findings: Secondary | ICD-10-CM | POA: Diagnosis not present

## 2013-04-16 DIAGNOSIS — I4891 Unspecified atrial fibrillation: Secondary | ICD-10-CM | POA: Diagnosis not present

## 2013-04-16 NOTE — Patient Instructions (Signed)
Limit your sodium (Salt) intake  Cardiology followup as scheduled    It is important that you exercise regularly, at least 20 minutes 3 to 4 times per week.  If you develop chest pain or shortness of breath seek  medical attention.  You need to lose weight.  Consider a lower calorie diet and regular exercise.  Return in 6 months for follow-up

## 2013-04-16 NOTE — Progress Notes (Signed)
Subjective:    Patient ID: Glen [name], male    DOB: 1927-10-24, 79 y.o.   MRN: [id number]  HPI  Wt Readings from Last 3 Encounters:  04/16/13 257 lb (116.574 kg)  03/31/13 251 lb (113.853 kg)  02/28/13 254 lb 6.4 oz (115.395 kg)    Subjective:    Patient ID: Glen [name], male    DOB: Apr 20, 1928, 79 y.o.   MRN: [id number]  HPI  CC: cpx - donig well.  History of Present Illness:   56 -year-old patient who is seen today for a comprehensive evaluation. He is followed closely cardiology due to chronic atrial fibrillation. He is on warfarin anticoagulation. He has a history of hypertension, mild, dyslipidemia, and exogenous obesity. He has a history of colonic polyps and a family history of colon cancer. His cardiopulmonary status has been stable. There has been some weight gain since his last visit here.   Here for Medicare AWV:   1. Risk factors based on Past M, S, F history: risk factors include a family history of prostate and colon cancer.  2. Physical Activities: fairly sedentary, but no exercise restrictions  3. Depression/mood: no history of depression or mood disorder  4. Hearing: no hearing deficits  5. ADL's: completely independent in all aspects of daily living  6. Fall Risk: low  7. Home Safety: no problems identified  8. Height, weight, &visual acuity: unremarkable except for a 10-pound weight gain over the past 6 months  9. Counseling: heart healthy diet calorie restriction and more exercise. All encouraged  10. Labs ordered based on risk factors: laboratory profile, including lipid panel will be reviewed. PSA test in discuss and it was elected to defer  11. Referral Coordination-  follow up cardiology 12. Care Plan- heart healthy diet restricted salt and weight loss. All encouraged  13. Cognitive Assessment- alert and oriented, with normal affect. Several family members have early dementia   Preventive Screening-Counseling & Management  Alcohol-Tobacco   Smoking Status: never   Allergies:  1) ! Codeine  Past History:   Past Medical History:  ATRIAL FIBRILLATION (ICD-427.31)  ANTICOAGULATION THERAPY (ICD-V58.61)  PALPITATIONS (ICD-785.1)  LEFT VENTRICULAR FUNCTION, DECREASED (ICD-429.2)  HYPERTENSION, UNSPECIFIED (ICD-401.9)  HYPERLIPIDEMIA (ICD-272.4)  OBESITY (ICD-278.00)  ENCOUNTER FOR THERAPEUTIC DRUG MONITORING (ICD-V58.83)  SPECIAL SCREENING MALIGNANT NEOPLASM OF PROSTATE (ICD-V76.44)  FAMILY HISTORY DIABETES 1ST DEGREE RELATIVE (ICD-V18.0)  FAMILY HISTORY OF COLON CA 1ST DEGREE RELATIVE <60 (ICD-V16.0)  DIVERTICULITIS, HX OF (ICD-V12.79)  COLONIC POLYPS, HX OF (ICD-V12.72)  COLONIC POLYPS, HX OF (ICD-V12.72)   Past Surgical History:  Colonoscopy December 2005  Anal Fistula Repair  Arthroscopic R Knee  Tonsillectomy  negative ETT 1991  Cataract extraction 2004  status post balloon valvuloplasty at [facility] in 1996  2-D echocardiogram November 2009, 2011   Family History:  Reviewed history from 03/13/2008 and no changes required.  Family History of Colon CA 1st degree relative <60  Family History Diabetes 1st degree relative  Family History of Prostate CA 1st degree relative <50  Family History of Cardiovascular disorder  father died age 77 of a myocardial infarction  mother died age 52, colon cancer  Two brothers 4 sisters  Positive for coronary disease. Two siblings, status post stenting to siblings with heart disease. One. Status post pacemaker insertion. One brother with prostate cancer, status post RT   Social History:  Reviewed history from 03/17/2009 and no changes required.  Retired  Never Smoked  Alcohol use-no  Married  Regular exercise-yes: [employer] 4  times/week   Wt Readings from Last 3 Encounters:  04/14/11 245 lb (111.131 kg)  10/10/10 240 lb (108.863 kg)  04/11/10 240 lb (108.863 kg)    Past Medical History  Diagnosis Date  . Arrhythmia   . Drug therapy     Anticoagulation Therapy  .  Palpitation   . Decreased left ventricular function   . Hypertension   . Hyperlipidemia   . Obesity   . Neoplasm of prostate     special screening malignant   . Diverticulitis   . History of colonic polyps   . FHx: colon cancer   . Family history of diabetes insipidus        Review of Systems  Constitutional: Negative for fever, chills, activity change, appetite change and fatigue.  HENT: Negative for hearing loss, ear pain, congestion, rhinorrhea, sneezing, mouth sores, trouble swallowing, neck pain, neck stiffness, dental problem, voice change, sinus pressure and tinnitus.   Eyes: Negative for photophobia, pain, redness and visual disturbance.  Respiratory: Negative for apnea, cough, choking, chest tightness, shortness of breath and wheezing.   Cardiovascular: Negative for chest pain, palpitations and leg swelling.  Gastrointestinal: Negative for nausea, vomiting, abdominal pain, diarrhea, constipation, blood in stool, abdominal distention, anal bleeding and rectal pain.  Genitourinary: Negative for dysuria, urgency, frequency, hematuria, flank pain, decreased urine volume, discharge, penile swelling, scrotal swelling, difficulty urinating, genital sores and testicular pain.  Musculoskeletal: Negative for myalgias, back pain, joint swelling, arthralgias and gait problem.  Skin: Negative for color change, rash and wound.  Neurological: Negative for dizziness, tremors, seizures, syncope, facial asymmetry, speech difficulty, weakness, light-headedness, numbness and headaches.  Hematological: Negative for adenopathy. Does not bruise/bleed easily.  Psychiatric/Behavioral: Negative for suicidal ideas, hallucinations, behavioral problems, confusion, sleep disturbance, self-injury, dysphoric mood, decreased concentration and agitation. The patient is not nervous/anxious.        Objective:   Physical Exam  Constitutional: He appears well-developed and well-nourished.  HENT:  Head:  Normocephalic and atraumatic.  Right Ear: External ear normal.  Left Ear: External ear normal.  Nose: Nose normal.  Mouth/Throat: Oropharynx is clear and moist.  Eyes: Conjunctivae and EOM are normal. Pupils are equal, round, and reactive to light. No scleral icterus.  Neck: Normal range of motion. Neck supple. No JVD present. No thyromegaly present.  Cardiovascular: Normal rate, normal heart sounds and intact distal pulses.  Exam reveals no gallop and no friction rub.   No murmur heard.      Irregular rhythm with a controlled ventricular response  Pulmonary/Chest: Effort normal and breath sounds normal. He exhibits no tenderness.  Abdominal: Soft. Bowel sounds are normal. He exhibits no distension and no mass. There is no tenderness.  Genitourinary: Rectum normal, prostate normal and penis normal. Guaiac negative stool. No penile tenderness.  Musculoskeletal: Normal range of motion. He exhibits edema. He exhibits no tenderness.       +1 lower extremity edema.  Lymphadenopathy:    He has no cervical adenopathy.  Neurological: He is alert. He has normal reflexes. No cranial nerve deficit. Coordination normal.  Skin: Skin is warm and dry. No rash noted.  Psychiatric: He has a normal mood and affect. His behavior is normal.          Assessment & Plan:   Annual health assessment Hypertension well controlled Chronic atrial fibrillation Chronic Coumadin anticoagulation Exogenous obesity  Laboratory data including prothrombin time will be reviewed Followup cardiology  Return here 6 months    Review of Systems  see above Objective:   Physical Exam  Cardiovascular:  Dorsalis pedis pulses intact. Posterior tibial pulses not easily palpable  Skin:  Onychomycotic nail changes          Assessment & Plan:   Preventive health examination Hypertension well controlled Atrial fibrillation. Improved rate control (80-90) on  75 mg of atenolol  We'll continue present  regimen Cardiology followup Recheck 6 months

## 2013-04-22 ENCOUNTER — Encounter: Payer: Self-pay | Admitting: *Deleted

## 2013-04-22 ENCOUNTER — Encounter (INDEPENDENT_AMBULATORY_CARE_PROVIDER_SITE_OTHER): Payer: Medicare Other

## 2013-04-22 DIAGNOSIS — I4891 Unspecified atrial fibrillation: Secondary | ICD-10-CM

## 2013-04-22 NOTE — Progress Notes (Signed)
Patient ID: Glen [name], male   DOB: 1928/08/05, 79 y.o.   MRN: [id number] E-Cardio 24 Hour Holter Monitor applied to patient.

## 2013-04-25 ENCOUNTER — Encounter: Payer: Self-pay | Admitting: Physician Assistant

## 2013-04-28 ENCOUNTER — Encounter: Payer: Self-pay | Admitting: Cardiology

## 2013-04-28 ENCOUNTER — Ambulatory Visit (INDEPENDENT_AMBULATORY_CARE_PROVIDER_SITE_OTHER): Payer: Medicare Other | Admitting: General Practice

## 2013-04-28 DIAGNOSIS — I4891 Unspecified atrial fibrillation: Secondary | ICD-10-CM

## 2013-04-28 DIAGNOSIS — Z7901 Long term (current) use of anticoagulants: Secondary | ICD-10-CM | POA: Diagnosis not present

## 2013-04-28 LAB — POCT INR: INR: 2.9

## 2013-05-15 DIAGNOSIS — IMO0002 Reserved for concepts with insufficient information to code with codable children: Secondary | ICD-10-CM | POA: Diagnosis not present

## 2013-05-15 DIAGNOSIS — M1710 Unilateral primary osteoarthritis, unspecified knee: Secondary | ICD-10-CM | POA: Diagnosis not present

## 2013-05-26 ENCOUNTER — Encounter (HOSPITAL_COMMUNITY): Payer: Self-pay | Admitting: Emergency Medicine

## 2013-05-26 ENCOUNTER — Encounter: Payer: Self-pay | Admitting: Internal Medicine

## 2013-05-26 ENCOUNTER — Inpatient Hospital Stay (HOSPITAL_COMMUNITY)
Admission: EM | Admit: 2013-05-26 | Discharge: 2013-05-29 | DRG: 689 | Disposition: A | Discharge status: Home Health | Payer: Medicare Other | Attending: Internal Medicine | Admitting: Internal Medicine

## 2013-05-26 ENCOUNTER — Emergency Department (HOSPITAL_COMMUNITY): Payer: Medicare Other

## 2013-05-26 ENCOUNTER — Ambulatory Visit (INDEPENDENT_AMBULATORY_CARE_PROVIDER_SITE_OTHER): Payer: Medicare Other | Admitting: General Practice

## 2013-05-26 DIAGNOSIS — D696 Thrombocytopenia, unspecified: Secondary | ICD-10-CM

## 2013-05-26 DIAGNOSIS — A498 Other bacterial infections of unspecified site: Secondary | ICD-10-CM | POA: Diagnosis present

## 2013-05-26 DIAGNOSIS — N390 Urinary tract infection, site not specified: Secondary | ICD-10-CM

## 2013-05-26 DIAGNOSIS — E669 Obesity, unspecified: Secondary | ICD-10-CM | POA: Diagnosis present

## 2013-05-26 DIAGNOSIS — I4891 Unspecified atrial fibrillation: Secondary | ICD-10-CM

## 2013-05-26 DIAGNOSIS — I482 Chronic atrial fibrillation, unspecified: Secondary | ICD-10-CM | POA: Diagnosis present

## 2013-05-26 DIAGNOSIS — G92 Toxic encephalopathy: Secondary | ICD-10-CM | POA: Diagnosis not present

## 2013-05-26 DIAGNOSIS — E876 Hypokalemia: Secondary | ICD-10-CM | POA: Diagnosis not present

## 2013-05-26 DIAGNOSIS — J189 Pneumonia, unspecified organism: Secondary | ICD-10-CM | POA: Diagnosis present

## 2013-05-26 DIAGNOSIS — D72829 Elevated white blood cell count, unspecified: Secondary | ICD-10-CM | POA: Diagnosis present

## 2013-05-26 DIAGNOSIS — R5381 Other malaise: Secondary | ICD-10-CM

## 2013-05-26 DIAGNOSIS — E872 Acidosis, unspecified: Secondary | ICD-10-CM | POA: Diagnosis not present

## 2013-05-26 DIAGNOSIS — G929 Unspecified toxic encephalopathy: Secondary | ICD-10-CM | POA: Diagnosis not present

## 2013-05-26 DIAGNOSIS — R0602 Shortness of breath: Secondary | ICD-10-CM | POA: Diagnosis not present

## 2013-05-26 DIAGNOSIS — K219 Gastro-esophageal reflux disease without esophagitis: Secondary | ICD-10-CM | POA: Diagnosis present

## 2013-05-26 DIAGNOSIS — I10 Essential (primary) hypertension: Secondary | ICD-10-CM | POA: Diagnosis present

## 2013-05-26 DIAGNOSIS — E785 Hyperlipidemia, unspecified: Secondary | ICD-10-CM | POA: Diagnosis present

## 2013-05-26 DIAGNOSIS — R509 Fever, unspecified: Secondary | ICD-10-CM | POA: Diagnosis not present

## 2013-05-26 DIAGNOSIS — N400 Enlarged prostate without lower urinary tract symptoms: Secondary | ICD-10-CM | POA: Diagnosis present

## 2013-05-26 DIAGNOSIS — Z7901 Long term (current) use of anticoagulants: Secondary | ICD-10-CM | POA: Diagnosis not present

## 2013-05-26 DIAGNOSIS — Z79899 Other long term (current) drug therapy: Secondary | ICD-10-CM | POA: Diagnosis not present

## 2013-05-26 LAB — CBC WITH DIFFERENTIAL/PLATELET
Basophils Absolute: 0 10*3/uL (ref 0.0–0.1)
Basophils Relative: 0 % (ref 0–1)
Eosinophils Absolute: 0.1 10*3/uL (ref 0.0–0.7)
Eosinophils Relative: 0 % (ref 0–5)
HCT: 44.3 % (ref 39.0–52.0)
Hemoglobin: 15.3 g/dL (ref 13.0–17.0)
Lymphocytes Relative: 6 % — ABNORMAL LOW (ref 12–46)
Lymphs Abs: 0.8 10*3/uL (ref 0.7–4.0)
MCH: 31.9 pg (ref 26.0–34.0)
MCHC: 34.5 g/dL (ref 30.0–36.0)
MCV: 92.3 fL (ref 78.0–100.0)
Monocytes Absolute: 0.2 10*3/uL (ref 0.1–1.0)
Monocytes Relative: 1 % — ABNORMAL LOW (ref 3–12)
Neutro Abs: 11.3 10*3/uL — ABNORMAL HIGH (ref 1.7–7.7)
Neutrophils Relative %: 92 % — ABNORMAL HIGH (ref 43–77)
Platelets: 139 10*3/uL — ABNORMAL LOW (ref 150–400)
RBC: 4.8 MIL/uL (ref 4.22–5.81)
RDW: 13.2 % (ref 11.5–15.5)
WBC: 12.3 10*3/uL — ABNORMAL HIGH (ref 4.0–10.5)

## 2013-05-26 LAB — BASIC METABOLIC PANEL
BUN: 19 mg/dL (ref 6–23)
CO2: 17 mEq/L — ABNORMAL LOW (ref 19–32)
Calcium: 6.4 mg/dL — CL (ref 8.4–10.5)
Chloride: 109 mEq/L (ref 96–112)
Creatinine, Ser: 0.71 mg/dL (ref 0.50–1.35)
GFR calc Af Amer: >90 mL/min (ref >90)
GFR calc non Af Amer: 84 mL/min — ABNORMAL LOW (ref >90)
Glucose, Bld: 102 mg/dL — ABNORMAL HIGH (ref 70–99)
Potassium: 3.2 mEq/L — ABNORMAL LOW (ref 3.5–5.1)
Sodium: 136 mEq/L (ref 135–145)

## 2013-05-26 LAB — POCT INR: INR: 2.1

## 2013-05-26 MED ORDER — LIDOCAINE HCL 2 % EX GEL
Freq: Once | CUTANEOUS | Status: DC
  Filled 2013-05-26: qty 10

## 2013-05-26 NOTE — ED Provider Notes (Signed)
CSN: [id number]     Arrival date & time 05/26/13  2236 History   First MD Initiated Contact with Patient 05/26/13 2311     Chief Complaint  Patient presents with  . Hematuria   (Consider location/radiation/quality/duration/timing/severity/associated sxs/prior Treatment) Patient is a 79 y.o. male presenting with hematuria. The history is provided by the patient.  Hematuria  He had onset about 5 PM of urinary urgency but only passed a small amount of urine. Urinary urgency continued and he then had a shaking chill. EMS came out and noted "low-grade fever" but could not give the family a temperature. He took a dose of acetaminophen. He continued to have a sense of needing to urinate frequently and did notice a small amount of blood in the urine that he did pass. He denies abdominal pain or flank pain. There has been no nausea or vomiting. He has had a cough for the last several weeks which is productive of sputum in the morning but he swallows his sputum. He denies dyspnea. He denies arthralgias or myalgias. He is taking warfarin for atrial fibrillation and had INR checked earlier today.  Past Medical History  Diagnosis Date  . Atrial fibrillation   . Drug therapy     Anticoagulation Therapy  . Palpitation   . Hx of echocardiogram     a. Echo 5/11:  EF 55-60%, mild dilated ascending aorta, mod LAE  . Hypertension   . Hyperlipidemia   . Obesity   . Neoplasm of prostate     special screening malignant   . Diverticulitis   . History of colonic polyps   . FHx: colon cancer   . Family history of diabetes insipidus    Past Surgical History  Procedure Laterality Date  . Colonoscopy  December 2005  . Anal fistula repair    . Knee arthroscopy      Right  . Tonsillectomy    . Ett  1991    Negative  . Cataract extraction  2004  . Cardiac valve surgery  1996    status post balloon valvuloplasty at [facility]  . 2-d echocardiogram  November 2009, 2011   Family History  Problem Relation Age of  Onset  . Colon cancer    . Diabetes    . Prostate cancer Brother   . Heart disease    . Heart attack Father   . Coronary artery disease      Two siblings, status post stenting to siblings with heart disease. One. Status post pacemaker insertion.   History  Substance Use Topics  . Smoking status: Never Smoker   . Smokeless tobacco: Never Used  . Alcohol Use: No    Review of Systems  Genitourinary: Positive for hematuria.  All other systems reviewed and are negative.    Allergies  Codeine  Home Medications   Current Outpatient Rx  Name  Route  Sig  Dispense  Refill  . acetaminophen (TYLENOL) 500 MG tablet   Oral   Take 1,000 mg by mouth every 6 (six) hours as needed for pain.          Marland Kitchen atenolol (TENORMIN) 50 MG tablet   Oral   Take 1.5 tablets (75 mg total) by mouth daily.   45 tablet   11   . Probiotic Product (PROBIOTIC DAILY PO)   Oral   Take 1 capsule by mouth daily.         Marland Kitchen warfarin (COUMADIN) 5 MG tablet   Oral   Take  5-7.5 mg by mouth See admin instructions. Patient takes 7.5 mg Tue, Wed, Sat. 5 mg Su,Mo,Thur,Fri          BP 144/78  Pulse 123  Temp(Src) 98.1 F (36.7 C) (Oral)  Resp 18  SpO2 90% Physical Exam  Nursing note and vitals reviewed.  79 year old male, resting comfortably and in no acute distress. Vital signs are significant for tachycardia with heart rate 123, and systolic hypertension with blood pressure 144/78. Oxygen saturation is 90%, which is normal. Head is normocephalic and atraumatic. PERRLA, EOMI. Oropharynx is clear. Neck is nontender and supple without adenopathy or JVD. Back is nontender and there is no CVA tenderness. Lungs have rales in the right base. There are no wheezes or rhonchi. Chest is nontender. Heart has regular rate and rhythm without murmur. Abdomen is soft, flat, nontender without masses or hepatosplenomegaly and peristalsis is normoactive. Extremities have trace edema, full range of motion is  present. Skin is warm and dry without rash. Neurologic: Mental status is normal, cranial nerves are intact, there are no motor or sensory deficits.  ED Course  Procedures (including critical care time) Results for orders placed during the hospital encounter of 05/26/13  URINALYSIS, ROUTINE W REFLEX MICROSCOPIC      Result Value Range   Color, Urine YELLOW  YELLOW   APPearance CLOUDY (*) CLEAR   Specific Gravity, Urine 1.030  1.005 - 1.030   pH 5.0  5.0 - 8.0   Glucose, UA NEGATIVE  NEGATIVE mg/dL   Hgb urine dipstick LARGE (*) NEGATIVE   Bilirubin Urine NEGATIVE  NEGATIVE   Ketones, ur NEGATIVE  NEGATIVE mg/dL   Protein, ur 811 (*) NEGATIVE mg/dL   Urobilinogen, UA 0.2  0.0 - 1.0 mg/dL   Nitrite POSITIVE (*) NEGATIVE   Leukocytes, UA LARGE (*) NEGATIVE  CBC WITH DIFFERENTIAL      Result Value Range   WBC 12.3 (*) 4.0 - 10.5 K/uL   RBC 4.80  4.22 - 5.81 MIL/uL   Hemoglobin 15.3  13.0 - 17.0 g/dL   HCT 91.4  78.2 - 95.6 %   MCV 92.3  78.0 - 100.0 fL   MCH 31.9  26.0 - 34.0 pg   MCHC 34.5  30.0 - 36.0 g/dL   RDW 21.3  08.6 - 57.8 %   Platelets 139 (*) 150 - 400 K/uL   Neutrophils Relative % 92 (*) 43 - 77 %   Neutro Abs 11.3 (*) 1.7 - 7.7 K/uL   Lymphocytes Relative 6 (*) 12 - 46 %   Lymphs Abs 0.8  0.7 - 4.0 K/uL   Monocytes Relative 1 (*) 3 - 12 %   Monocytes Absolute 0.2  0.1 - 1.0 K/uL   Eosinophils Relative 0  0 - 5 %   Eosinophils Absolute 0.1  0.0 - 0.7 K/uL   Basophils Relative 0  0 - 1 %   Basophils Absolute 0.0  0.0 - 0.1 K/uL  BASIC METABOLIC PANEL      Result Value Range   Sodium 136  135 - 145 mEq/L   Potassium 3.2 (*) 3.5 - 5.1 mEq/L   Chloride 109  96 - 112 mEq/L   CO2 17 (*) 19 - 32 mEq/L   Glucose, Bld 102 (*) 70 - 99 mg/dL   BUN 19  6 - 23 mg/dL   Creatinine, Ser 4.69  0.50 - 1.35 mg/dL   Calcium 6.4 (*) 8.4 - 10.5 mg/dL   GFR calc non Af Denyse Dago  84 (*) >90 mL/min   GFR calc Af Amer >90  >90 mL/min  URINE MICROSCOPIC-ADD ON      Result Value Range    WBC, UA 21-50  <3 WBC/hpf   RBC / HPF 11-20  <3 RBC/hpf   Bacteria, UA MANY (*) RARE   Dg Chest 2 View  05/27/2013   *RADIOLOGY REPORT*  Clinical Data: Shortness of breath, hematuria  CHEST - 2 VIEW  Comparison: None.  Findings: Heart size upper normal to mildly enlarged. Aortic atherosclerosis.  Mild bibasilar opacities. Reticulonodular interstitial prominence and question sequelae of granulomas infection.  No pleural effusion.  No pneumothorax.  Multilevel degenerative changes.  IMPRESSION: Mild bibasilar opacities; atelectasis versus infiltrate.  Reticulonodular interstitial prominence, nonspecific. Most often chronic or atypical/viral infection.  Heart size upper normal to mildly enlarged.   Original Report Authenticated By: Jearld Lesch, M.D.    Images viewed by me.  ECG: Date: 05/26/2013  Rate: 127  Rhythm: atrial fibrillation  QRS Axis: normal  Intervals: normal  ST/T Wave abnormalities: nonspecific T wave changes  Conduction Disutrbances:none  Narrative Interpretation: Atrial fibrillation with rapid ventricular response, low voltage, nonspecific T wave flattening. When compared with ECG of 04/01/1999 410, no significant changes are seen.  Old EKG Reviewed: unchanged MDM   1. Urinary tract infection   2. Hypokalemia   3. Hypocalcemia   4. Metabolic acidosis   5. Thrombocytopenia    Chills with hypoxia and history of cough worrisome for pneumonia. Urinary symptoms worrisome for possible UTI. He is afebrile here but that have acetaminophen within 4 hours of arrival. History of atrial fibrillation with tachycardia. His only medication for rate control is atenolol. Old records are reviewed and he had INR done this morning which is 2.1 which is therapeutic.  X-ray to me does appear to show right lower lobe infiltrate although radiologist feels it may be atelectasis. He clearly has evidence of urinary tract infection. Heart rate has come down to 95-100. He is given a dose of  ceftriaxone and azithromycin which would adequately treat community-acquired pneumonia and urinary tract infection. Electrolytes are significant for hypokalemia and was given a dose of oral potassium. Also noted are CO2 of 17 and hypocalcemia with which were not present on prior metabolic panels. Anion gap is normal. These include these disturbances is not clear. Mild thrombocytopenia is noted and is not clinically significant. Case is discussed with Dr. Allena Katz of [facility] who agrees to admit the patient.  Dione Booze, MD 05/27/13 [phone]

## 2013-05-26 NOTE — ED Notes (Signed)
Bed: WA02 Expected date: 05/26/13 Expected time: 10:27 PM Means of arrival: Ambulance Comments: Urinary retention

## 2013-05-26 NOTE — ED Notes (Signed)
Pt began having shaking and chill earlier and took two tylenol. Per pt wife pt began having chills again states he went to rest room and noticed blood in his urine. Pt states he has a feeling of urgency even after urinating.

## 2013-05-27 ENCOUNTER — Telehealth: Payer: Self-pay | Admitting: *Deleted

## 2013-05-27 ENCOUNTER — Encounter (HOSPITAL_COMMUNITY): Payer: Self-pay | Admitting: Internal Medicine

## 2013-05-27 DIAGNOSIS — I4891 Unspecified atrial fibrillation: Secondary | ICD-10-CM

## 2013-05-27 DIAGNOSIS — N390 Urinary tract infection, site not specified: Principal | ICD-10-CM

## 2013-05-27 DIAGNOSIS — E876 Hypokalemia: Secondary | ICD-10-CM

## 2013-05-27 LAB — COMPREHENSIVE METABOLIC PANEL
ALT: 15 U/L (ref 0–53)
AST: 18 U/L (ref 0–37)
Albumin: 3.3 g/dL — ABNORMAL LOW (ref 3.5–5.2)
Alkaline Phosphatase: 75 U/L (ref 39–117)
BUN: 20 mg/dL (ref 6–23)
CO2: 28 mEq/L (ref 19–32)
Calcium: 8.5 mg/dL (ref 8.4–10.5)
Chloride: 99 mEq/L (ref 96–112)
Creatinine, Ser: 1.16 mg/dL (ref 0.50–1.35)
GFR calc Af Amer: 65 mL/min — ABNORMAL LOW (ref >90)
GFR calc non Af Amer: 56 mL/min — ABNORMAL LOW (ref >90)
Glucose, Bld: 118 mg/dL — ABNORMAL HIGH (ref 70–99)
Potassium: 4.2 mEq/L (ref 3.5–5.1)
Sodium: 135 mEq/L (ref 135–145)
Total Bilirubin: 0.7 mg/dL (ref 0.3–1.2)
Total Protein: 6.1 g/dL (ref 6.0–8.3)

## 2013-05-27 LAB — URINALYSIS, ROUTINE W REFLEX MICROSCOPIC
Bilirubin Urine: NEGATIVE
Glucose, UA: NEGATIVE mg/dL
Ketones, ur: NEGATIVE mg/dL
Nitrite: POSITIVE — AB
Protein, ur: 100 mg/dL — AB
Specific Gravity, Urine: 1.03 (ref 1.005–1.030)
Urobilinogen, UA: 0.2 mg/dL (ref 0.0–1.0)
pH: 5 (ref 5.0–8.0)

## 2013-05-27 LAB — CBC WITH DIFFERENTIAL/PLATELET
Basophils Absolute: 0 10*3/uL (ref 0.0–0.1)
Basophils Relative: 0 % (ref 0–1)
Eosinophils Absolute: 0 10*3/uL (ref 0.0–0.7)
Eosinophils Relative: 0 % (ref 0–5)
HCT: 46.3 % (ref 39.0–52.0)
Hemoglobin: 16 g/dL (ref 13.0–17.0)
Lymphocytes Relative: 4 % — ABNORMAL LOW (ref 12–46)
Lymphs Abs: 0.9 10*3/uL (ref 0.7–4.0)
MCH: 32.2 pg (ref 26.0–34.0)
MCHC: 34.6 g/dL (ref 30.0–36.0)
MCV: 93.2 fL (ref 78.0–100.0)
Monocytes Absolute: 0.9 10*3/uL (ref 0.1–1.0)
Monocytes Relative: 4 % (ref 3–12)
Neutro Abs: 21.4 10*3/uL — ABNORMAL HIGH (ref 1.7–7.7)
Neutrophils Relative %: 92 % — ABNORMAL HIGH (ref 43–77)
Platelets: 154 10*3/uL (ref 150–400)
RBC: 4.97 MIL/uL (ref 4.22–5.81)
RDW: 13.3 % (ref 11.5–15.5)
WBC: 23.2 10*3/uL — ABNORMAL HIGH (ref 4.0–10.5)

## 2013-05-27 LAB — URINE MICROSCOPIC-ADD ON

## 2013-05-27 LAB — PROTIME-INR
INR: 2.22 — ABNORMAL HIGH (ref 0.00–1.49)
Prothrombin Time: 23.9 seconds — ABNORMAL HIGH (ref 11.6–15.2)

## 2013-05-27 LAB — LACTIC ACID, PLASMA: Lactic Acid, Venous: 2.7 mmol/L — ABNORMAL HIGH (ref 0.5–2.2)

## 2013-05-27 LAB — GLUCOSE, CAPILLARY: Glucose-Capillary: 118 mg/dL — ABNORMAL HIGH (ref 70–99)

## 2013-05-27 MED ORDER — DOCUSATE SODIUM 100 MG PO CAPS
200.0000 mg | ORAL_CAPSULE | Freq: Two times a day (BID) | ORAL | Status: DC
  Administered 2013-05-27 – 2013-05-29 (×4): 200 mg via ORAL
  Filled 2013-05-27 (×5): qty 2

## 2013-05-27 MED ORDER — POTASSIUM CHLORIDE CRYS ER 20 MEQ PO TBCR
40.0000 meq | EXTENDED_RELEASE_TABLET | Freq: Once | ORAL | Status: AC
  Administered 2013-05-27: 40 meq via ORAL
  Filled 2013-05-27: qty 2

## 2013-05-27 MED ORDER — SODIUM CHLORIDE 0.9 % IV SOLN
INTRAVENOUS | Status: AC
  Administered 2013-05-27: 16:00:00 via INTRAVENOUS

## 2013-05-27 MED ORDER — ATENOLOL 50 MG PO TABS
75.0000 mg | ORAL_TABLET | Freq: Every day | ORAL | Status: DC
  Administered 2013-05-27 – 2013-05-28 (×2): 75 mg via ORAL
  Filled 2013-05-27 (×3): qty 1

## 2013-05-27 MED ORDER — DEXTROSE 5 % IV SOLN
1.0000 g | Freq: Once | INTRAVENOUS | Status: AC
  Administered 2013-05-27: 1 g via INTRAVENOUS
  Filled 2013-05-27: qty 10

## 2013-05-27 MED ORDER — HALOPERIDOL LACTATE 5 MG/ML IJ SOLN: 1.0000 mg | Freq: Four times a day (QID) | INTRAMUSCULAR | Status: DC | PRN

## 2013-05-27 MED ORDER — ACETAMINOPHEN 650 MG RE SUPP: 650.0000 mg | Freq: Four times a day (QID) | RECTAL | Status: DC | PRN

## 2013-05-27 MED ORDER — WARFARIN SODIUM 7.5 MG PO TABS
7.5000 mg | ORAL_TABLET | ORAL | Status: DC
  Administered 2013-05-27 – 2013-05-28 (×2): 7.5 mg via ORAL
  Filled 2013-05-27 (×2): qty 1

## 2013-05-27 MED ORDER — TAMSULOSIN HCL 0.4 MG PO CAPS
0.4000 mg | ORAL_CAPSULE | Freq: Every day | ORAL | Status: DC
  Administered 2013-05-27 – 2013-05-28 (×2): 0.4 mg via ORAL
  Filled 2013-05-27 (×3): qty 1

## 2013-05-27 MED ORDER — WARFARIN - PHARMACIST DOSING INPATIENT: Freq: Every day | Status: DC

## 2013-05-27 MED ORDER — DEXTROSE 5 % IV SOLN: 500.0000 mg | Freq: Every day | INTRAVENOUS | Status: DC

## 2013-05-27 MED ORDER — DEXTROSE 5 % IV SOLN
1.0000 g | Freq: Every day | INTRAVENOUS | Status: DC
  Administered 2013-05-27 – 2013-05-28 (×2): 1 g via INTRAVENOUS
  Filled 2013-05-27 (×3): qty 10

## 2013-05-27 MED ORDER — SODIUM CHLORIDE 0.9 % IV BOLUS (SEPSIS)
500.0000 mL | Freq: Once | INTRAVENOUS | Status: AC
  Administered 2013-05-27: 500 mL via INTRAVENOUS

## 2013-05-27 MED ORDER — HYDROCODONE-ACETAMINOPHEN 5-325 MG PO TABS
1.0000 | ORAL_TABLET | ORAL | Status: DC | PRN
  Administered 2013-05-27 – 2013-05-28 (×2): 1 via ORAL
  Filled 2013-05-27 (×2): qty 1

## 2013-05-27 MED ORDER — SODIUM CHLORIDE 0.9 % IJ SOLN
3.0000 mL | Freq: Two times a day (BID) | INTRAMUSCULAR | Status: DC
  Administered 2013-05-28 – 2013-05-29 (×3): 3 mL via INTRAVENOUS

## 2013-05-27 MED ORDER — DEXTROSE 5 % IV SOLN
500.0000 mg | Freq: Once | INTRAVENOUS | Status: AC
  Administered 2013-05-27: 500 mg via INTRAVENOUS
  Filled 2013-05-27: qty 500

## 2013-05-27 MED ORDER — WARFARIN SODIUM 5 MG PO TABS
5.0000 mg | ORAL_TABLET | ORAL | Status: DC
Start: 2013-05-29 — End: 2013-05-29
  Filled 2013-05-27: qty 1

## 2013-05-27 MED ORDER — ACETAMINOPHEN 325 MG PO TABS
650.0000 mg | ORAL_TABLET | Freq: Four times a day (QID) | ORAL | Status: DC | PRN
  Administered 2013-05-27 (×2): 650 mg via ORAL
  Filled 2013-05-27 (×2): qty 2

## 2013-05-27 NOTE — ED Notes (Signed)
Pt had less than 21ml in bladder.

## 2013-05-27 NOTE — Telephone Encounter (Signed)
Spoke to pt's wife, told her received My Chart message from pt and was following up. Mrs Moncrief said pt was admitted to the hospital late last night.Told her okay just wanted to make sure he was seen.

## 2013-05-27 NOTE — Progress Notes (Addendum)
[facility] PROGRESS NOTE Assessment/Plan: Urinary tract infection: - Rocephin started on 8.26.2014 - UC. Leukocytosis worsening. - ? Due to BPH start flomax.  Hypokalemia: - resolved.   Atrial fibrillation/Current use of long term anticoagulation - per pharmacy - rate controlled.  HYPERTENSION, UNSPECIFIED - Stable.  Delirium: - Confused at time as per family member. - Haldol.  Code Status: full Family Communication: none  Disposition Plan: inaptient   Consultants:  none  Procedures:  none  Antibiotics:  rocephin 8.26.2014  HPI/Subjective: Feels tired.  Objective: Filed Vitals:   05/26/13 2241 05/27/13 0354 05/27/13 0403 05/27/13 0446  BP: 144/78  94/64   Pulse: 123  129   Temp: 98.1 F (36.7 C)  98.1 F (36.7 C)   TempSrc: Oral  Oral   Resp: 18  20   Height:  6' (1.829 m)    Weight:  117.164 kg (258 lb 4.8 oz)  117.174 kg (258 lb 5.2 oz)  SpO2: 90%  97%     Intake/Output Summary (Last 24 hours) at 05/27/13 0836 Last data filed at 05/27/13 0610  Gross per 24 hour  Intake    222 ml  Output     75 ml  Net    147 ml   Filed Weights   05/27/13 0354 05/27/13 0446  Weight: 117.164 kg (258 lb 4.8 oz) 117.174 kg (258 lb 5.2 oz)    Exam:  General: Alert, awake, oriented x3, in no acute distress.  HEENT: No bruits, no goiter.  Heart: Regular rate and rhythm, without murmurs, rubs, gallops.  Lungs: Good air movement, clear to auscultation. Abdomen: Soft, nontender, nondistended, positive bowel sounds.     Data Reviewed: Basic Metabolic Panel:  Recent Labs Lab 05/26/13 2300 05/27/13 0510  NA 136 135  K 3.2* 4.2  CL 109 99  CO2 17* 28  GLUCOSE 102* 118*  BUN 19 20  CREATININE 0.71 1.16  CALCIUM 6.4* 8.5   Liver Function Tests:  Recent Labs Lab 05/27/13 0510  AST 18  ALT 15  ALKPHOS 75  BILITOT 0.7  PROT 6.1  ALBUMIN 3.3*   No results found for this basename: LIPASE, AMYLASE,  in the last 168 hours No results found  for this basename: AMMONIA,  in the last 168 hours CBC:  Recent Labs Lab 05/26/13 2300 05/27/13 0510  WBC 12.3* 23.2*  NEUTROABS 11.3* 21.4*  HGB 15.3 16.0  HCT 44.3 46.3  MCV 92.3 93.2  PLT 139* 154   Cardiac Enzymes: No results found for this basename: CKTOTAL, CKMB, CKMBINDEX, TROPONINI,  in the last 168 hours BNP (last 3 results) No results found for this basename: PROBNP,  in the last 8760 hours CBG:  Recent Labs Lab 05/27/13 0750  GLUCAP 118*    No results found for this or any previous visit (from the past 240 hour(s)).   Studies: Dg Chest 2 View  05/27/2013   *RADIOLOGY REPORT*  Clinical Data: Shortness of breath, hematuria  CHEST - 2 VIEW  Comparison: None.  Findings: Heart size upper normal to mildly enlarged. Aortic atherosclerosis.  Mild bibasilar opacities. Reticulonodular interstitial prominence and question sequelae of granulomas infection.  No pleural effusion.  No pneumothorax.  Multilevel degenerative changes.  IMPRESSION: Mild bibasilar opacities; atelectasis versus infiltrate.  Reticulonodular interstitial prominence, nonspecific. Most often chronic or atypical/viral infection.  Heart size upper normal to mildly enlarged.   Original Report Authenticated By: Jearld Lesch, M.D.    Scheduled Meds: . atenolol  75 mg Oral Daily  .  azithromycin  500 mg Intravenous QHS  . cefTRIAXone (ROCEPHIN)  IV  1 g Intravenous QHS  . lidocaine   Urethral Once  . sodium chloride  3 mL Intravenous Q12H  . [START ON 05/29/2013] warfarin  5 mg Oral Custom  . warfarin  7.5 mg Oral Custom  . Warfarin - Pharmacist Dosing Inpatient   Does not apply q1800  . Warfarin - Pharmacist Dosing Inpatient   Does not apply q1800   Continuous Infusions: . sodium chloride       Marinda Elk  [facility] Pager [phone]. If 8PM-8AM, please contact night-coverage at [email or web address], password [facility] 05/27/2013, 8:36 AM  LOS: 1 day

## 2013-05-27 NOTE — H&P (Signed)
[facility] History and Physical  Patient: Glen [name]  ZOX:[id number]  DOB: Jul 09, 1928  DOA: 05/26/2013  Referring physician: Dr. Preston Fleeting PCP: Rogelia Boga, MD   Chief Complaint: Hematuria  HPI: Glen [name] is a 79 y.o. male with Past medical history of atrial fibrillation on Coumadin, hypertension, dyslipidemia, obesity. He presented today with a complaint of burning urination that started today associated with fever and chills and blood in the urine. He had 2 episodes before he came to the ED. He denies any flank pain, groin pain, scrotal pain. He denies any similar episodes in the past. He denies any weight gain or weight loss, or lymphadenopathy.  he denies any bleeding anywhere else, nausea, vomiting, abdominal pain. He denies any shortness of breath or use of oxygen at home. He mentions she is fairly active at his baseline, and gets short of breath when climbing 1 flight of stair, or walking around the yard. He has a cough, and he brings up yellowish expectoration which is worsened over last 1 week.  Review of Systems: as mentioned in the history of present illness.  A Comprehensive review of the other systems is negative.  Past Medical History  Diagnosis Date  . Atrial fibrillation   . Drug therapy     Anticoagulation Therapy  . Palpitation   . Hx of echocardiogram     a. Echo 5/11:  EF 55-60%, mild dilated ascending aorta, mod LAE  . Hypertension   . Hyperlipidemia   . Obesity   . Neoplasm of prostate     special screening malignant   . Diverticulitis   . History of colonic polyps   . FHx: colon cancer   . Family history of diabetes insipidus    Past Surgical History  Procedure Laterality Date  . Colonoscopy  December 2005  . Anal fistula repair    . Knee arthroscopy      Right  . Tonsillectomy    . Ett  1991    Negative  . Cataract extraction  2004  . Cardiac valve surgery  1996    status post balloon valvul[facility] at [facility]  . 2-d  echocardiogram  November 2009, 2011   Social History:  reports that he has never smoked. He has never used smokeless tobacco. He reports that he does not drink alcohol. His drug history is not on file. Patient is coming from home. Independent for most of his  ADL.  Allergies  Allergen Reactions  . Codeine Other (See Comments)    constpation    Family History  Problem Relation Age of Onset  . Colon cancer    . Diabetes    . Prostate cancer Brother   . Heart disease    . Heart attack Father   . Coronary artery disease      Two siblings, status post stenting to siblings with heart disease. One. Status post pacemaker insertion.    Prior to Admission medications   Medication Sig Start Date End Date Taking? Authorizing Provider  acetaminophen (TYLENOL) 500 MG tablet Take 1,000 mg by mouth every 6 (six) hours as needed for pain.    Yes Historical Provider, MD  atenolol (TENORMIN) 50 MG tablet Take 1.5 tablets (75 mg total) by mouth daily. 03/31/13  Yes Beatrice Lecher, PA-C  Probiotic Product (PROBIOTIC DAILY PO) Take 1 capsule by mouth daily.   Yes Historical Provider, MD  warfarin (COUMADIN) 5 MG tablet Take 5-7.5 mg by mouth See admin instructions. Patient takes 7.5 mg  Tue, Wed, Sat. 5 mg Su,Mo,Thur,Fri   Yes Historical Provider, MD    Physical Exam: Filed Vitals:   05/26/13 2241 05/27/13 0354 05/27/13 0403  BP: 144/78  94/64  Pulse: 123  129  Temp: 98.1 F (36.7 C)  98.1 F (36.7 C)  TempSrc: Oral  Oral  Resp: 18  20  Height:  6' (1.829 m)   Weight:  117.164 kg (258 lb 4.8 oz)   SpO2: 90%  97%    General: Alert, Awake and Oriented to Time, Place and Person. Appear in mild distress Eyes: PERRL ENT: Oral Mucosa clear moist. Neck: No JVD, no Carotid Bruits  Cardiovascular: S1 and S2 Present, no Murmur, Peripheral Pulses Present Respiratory: Bilateral Air entry equal and Decreased,   Bilateral basal Crackles, no wheezes Abdomen: Bowel Sound Present, Soft and Non tender   Skin: No Rash Extremities: Bilateral Pedal edema, no calf tenderness Neurologic: Grossly Unremarkable.  Labs on Admission:  CBC:  Recent Labs Lab 05/26/13 2300  WBC 12.3*  NEUTROABS 11.3*  HGB 15.3  HCT 44.3  MCV 92.3  PLT 139*    CMP     Component Value Date/Time   NA 136 05/26/2013 2300   K 3.2* 05/26/2013 2300   CL 109 05/26/2013 2300   CO2 17* 05/26/2013 2300   GLUCOSE 102* 05/26/2013 2300   GLUCOSE 87 09/06/2006 1031   BUN 19 05/26/2013 2300   CREATININE 0.71 05/26/2013 2300   CALCIUM 6.4* 05/26/2013 2300   PROT 7.1 01/16/2013 1158   ALBUMIN 4.1 01/16/2013 1158   AST 20 01/16/2013 1158   ALT 21 01/16/2013 1158   ALKPHOS 75 01/16/2013 1158   BILITOT 0.9 01/16/2013 1158   GFRNONAA 84* 05/26/2013 2300   GFRAA >90 05/26/2013 2300    No results found for this basename: LIPASE, AMYLASE,  in the last 168 hours No results found for this basename: AMMONIA,  in the last 168 hours  Cardiac Enzymes: No results found for this basename: CKTOTAL, CKMB, CKMBINDEX, TROPONINI,  in the last 168 hours  BNP (last 3 results) No results found for this basename: PROBNP,  in the last 8760 hours  Radiological Exams on Admission: Dg Chest 2 View  05/27/2013   *RADIOLOGY REPORT*  Clinical Data: Shortness of breath, hematuria  CHEST - 2 VIEW  Comparison: None.  Findings: Heart size upper normal to mildly enlarged. Aortic atherosclerosis.  Mild bibasilar opacities. Reticulonodular interstitial prominence and question sequelae of granulomas infection.  No pleural effusion.  No pneumothorax.  Multilevel degenerative changes.  IMPRESSION: Mild bibasilar opacities; atelectasis versus infiltrate.  Reticulonodular interstitial prominence, nonspecific. Most often chronic or atypical/viral infection.  Heart size upper normal to mildly enlarged.   Original Report Authenticated By: Jearld Lesch, M.D.    EKG: Independently reviewed. unchanged from previous tracings, atrial fibrillation, rate  124.  Assessment/Plan Principal Problem:   Urinary tract infection Active Problems:   HYPERTENSION, UNSPECIFIED   Atrial fibrillation   Current use of long term anticoagulation   Hypokalemia   1. Urinary tract infection Patient appears to have UTI with hematuria. He does not have any CVA tenderness, or renal colicky pain. At present he appears stable. We will treat him with IV ceftriaxone for UTI. He also appears to have possible community-acquired pneumonia or bronchitis And IV azithromycin will be added for coverage.  2. A. Fib At present he is in RVR Does not appear to be hemodynamically unstable, therefore we will continue his home dose of atenolol. If he continues  to be in rapid regular rate then we will give him IV Cardizem bolus. Continue Coumadin Possibly A. fib has caused diastolic dysfunction,   3. electrolyte abnormality Etiology is unclear we will repeat the labs  4. Hypertension Continue atenolol  DVT Prophylaxis: mechanical compression device Nutrition: cardiac  Code Status: full  Family Communication: family wife and daughter were at bedside   Author: Lynden Oxford, MD [facility] Pager: [phone] 05/27/2013, 4:30 AM    If 7PM-7AM, please contact night-coverage [email or web address] Password [email or web address]

## 2013-05-27 NOTE — Progress Notes (Signed)
ANTICOAGULATION CONSULT NOTE - Initial Consult  Pharmacy Consult for Warfarin Indication: atrial fibrillation  Allergies  Allergen Reactions  . Codeine Other (See Comments)    constpation    Patient Measurements: Height: 6' (182.9 cm) Weight: 258 lb 5.2 oz (117.174 kg) IBW/kg (Calculated) : 77.6 Heparin Dosing Weight:   Vital Signs: Temp: 98.1 F (36.7 C) (08/26 0403) Temp src: Oral (08/26 0403) BP: 94/64 mmHg (08/26 0403) Pulse Rate: 129 (08/26 0403)  Labs:  Recent Labs  05/26/13 0854 05/26/13 2300 05/27/13 0510  HGB  --  15.3 16.0  HCT  --  44.3 46.3  PLT  --  139* 154  LABPROT  --   --  23.9*  INR 2.1  --  2.22*  CREATININE  --  0.71 PENDING    CrCl cannot be calculated (Patient has no sCr result on file.).   Medical History: Past Medical History  Diagnosis Date  . Atrial fibrillation   . Drug therapy     Anticoagulation Therapy  . Palpitation   . Hx of echocardiogram     a. Echo 5/11:  EF 55-60%, mild dilated ascending aorta, mod LAE  . Hypertension   . Hyperlipidemia   . Obesity   . Neoplasm of prostate     special screening malignant   . Diverticulitis   . History of colonic polyps   . FHx: colon cancer   . Family history of diabetes insipidus     Medications:  Prescriptions prior to admission  Medication Sig Dispense Refill  . acetaminophen (TYLENOL) 500 MG tablet Take 1,000 mg by mouth every 6 (six) hours as needed for pain.       Marland Kitchen atenolol (TENORMIN) 50 MG tablet Take 1.5 tablets (75 mg total) by mouth daily.  45 tablet  11  . Probiotic Product (PROBIOTIC DAILY PO) Take 1 capsule by mouth daily.      Marland Kitchen warfarin (COUMADIN) 5 MG tablet Take 5-7.5 mg by mouth See admin instructions. Patient takes 7.5 mg Tue, Wed, Sat. 5 mg Su,Mo,Thur,Fri       Scheduled:  . atenolol  75 mg Oral Daily  . lidocaine   Urethral Once  . sodium chloride  3 mL Intravenous Q12H  . [START ON 05/29/2013] warfarin  5 mg Oral Custom  . warfarin  7.5 mg Oral Custom   . Warfarin - Pharmacist Dosing Inpatient   Does not apply q1800  . Warfarin - Pharmacist Dosing Inpatient   Does not apply q1800    Assessment: Patient with chronic warfarin for afib.  INR at goal this am.  Goal of Therapy:  INR 2-3    Plan:  Continue home warfarin dosing. INR daily.  Darlina Guys, Jacquenette Shone Crowford 05/27/2013,5:59 AM

## 2013-05-27 NOTE — Care Management Note (Addendum)
    Page 1 of 1   05/29/2013     1:49:18 PM   CARE MANAGEMENT NOTE 05/29/2013  Patient:  Glen [name], Glen [name]   Account Number:  [id number]  Date Initiated:  05/27/2013  Documentation initiated by:  [facility]  Subjective/Objective Assessment:   79 Y/O M ADMITTED W/UTI.     Action/Plan:   FROM HOME W/SPOUSE.HAS PCP,PHARMACY.   Anticipated DC Date:  05/29/2013   Anticipated DC Plan:  HOME W HOME HEALTH SERVICES      DC Planning Services  CM consult      Choice offered to / List presented to:  C-1 Patient        HH arranged  HH-2 PT      [facility] agency  [facility]   Status of service:  Completed, signed off Medicare Important Message given?   (If response is "NO", the following Medicare IM given date fields will be blank) Date Medicare IM given:   Date Additional Medicare IM given:    Discharge Disposition:  HOME W HOME HEALTH SERVICES  Per UR Regulation:  Reviewed for med. necessity/level of care/duration of stay  If discussed at Long Length of Stay Meetings, dates discussed:    Comments:  05/29/13 Duff Pozzi RN,BSN NCM [phone] PT-HH.[facility] CHOSEN FOR HHPT.TCKRISTEN [facility] REP,INFORMED OF HHPT,& D/C.  05/27/13 Kaisei Gilbo RN,BSN [id number]

## 2013-05-27 NOTE — Telephone Encounter (Signed)
Called patient to make appt. He actually had to call EMS back last night, and is in the hospital.

## 2013-05-28 DIAGNOSIS — I10 Essential (primary) hypertension: Secondary | ICD-10-CM

## 2013-05-28 DIAGNOSIS — N400 Enlarged prostate without lower urinary tract symptoms: Secondary | ICD-10-CM

## 2013-05-28 LAB — URINE CULTURE: Colony Count: >=100000

## 2013-05-28 LAB — EXPECTORATED SPUTUM ASSESSMENT W REFEX TO RESP CULTURE

## 2013-05-28 LAB — CBC
HCT: 41.9 % (ref 39.0–52.0)
Hemoglobin: 14.7 g/dL (ref 13.0–17.0)
MCH: 33.2 pg (ref 26.0–34.0)
MCHC: 35.1 g/dL (ref 30.0–36.0)
MCV: 94.6 fL (ref 78.0–100.0)
Platelets: 115 10*3/uL — ABNORMAL LOW (ref 150–400)
RBC: 4.43 MIL/uL (ref 4.22–5.81)
RDW: 13.8 % (ref 11.5–15.5)
WBC: 18.9 10*3/uL — ABNORMAL HIGH (ref 4.0–10.5)

## 2013-05-28 LAB — PROTIME-INR
INR: 2.27 — ABNORMAL HIGH (ref 0.00–1.49)
Prothrombin Time: 24.3 seconds — ABNORMAL HIGH (ref 11.6–15.2)

## 2013-05-28 LAB — GLUCOSE, CAPILLARY: Glucose-Capillary: 106 mg/dL — ABNORMAL HIGH (ref 70–99)

## 2013-05-28 LAB — LEGIONELLA ANTIGEN, URINE: Legionella Antigen, Urine: NEGATIVE

## 2013-05-28 LAB — STREP PNEUMONIAE URINARY ANTIGEN: Strep Pneumo Urinary Antigen: NEGATIVE

## 2013-05-28 LAB — EXPECTORATED SPUTUM ASSESSMENT W GRAM STAIN, RFLX TO RESP C

## 2013-05-28 MED ORDER — TRAMADOL HCL 50 MG PO TABS
50.0000 mg | ORAL_TABLET | Freq: Four times a day (QID) | ORAL | Status: DC | PRN
  Administered 2013-05-28 – 2013-05-29 (×3): 50 mg via ORAL
  Filled 2013-05-28 (×3): qty 1

## 2013-05-28 MED ORDER — PANTOPRAZOLE SODIUM 40 MG PO TBEC
40.0000 mg | DELAYED_RELEASE_TABLET | Freq: Every day | ORAL | Status: DC
  Administered 2013-05-28: 40 mg via ORAL
  Filled 2013-05-28 (×2): qty 1

## 2013-05-28 NOTE — Progress Notes (Signed)
ANTICOAGULATION CONSULT NOTE - Follow Up  Pharmacy Consult for Warfarin Indication: Hx of AFib  Allergies  Allergen Reactions  . Codeine Other (See Comments)    constpation    Patient Measurements: Height: 6' (182.9 cm) Weight: 255 lb 9.6 oz (115.939 kg) IBW/kg (Calculated) : 77.6  Vital Signs: Temp: 98.4 F (36.9 C) (08/27 0528) Temp src: Oral (08/27 0528) BP: 113/62 mmHg (08/27 0528) Pulse Rate: 94 (08/27 0528)  Labs:  Recent Labs  05/26/13 0854  05/26/13 2300 05/27/13 0510 05/28/13 0430  HGB  --   < > 15.3 16.0 14.7  HCT  --   --  44.3 46.3 41.9  PLT  --   --  139* 154 115*  LABPROT  --   --   --  23.9* 24.3*  INR 2.1  --   --  2.22* 2.27*  CREATININE  --   --  0.71 1.16  --   < > = values in this interval not displayed.  Estimated Creatinine Clearance: 63.4 ml/min (by C-G formula based on Cr of 1.16).  Assessment: 79 yo M admitted 8/26 with hematuria and UTI. Patient is on chronic warfarin for hx of Afib; home dose reported as 5mg  on SunMonThurFri and 7.5mg  on TuWedSat. Admission INR was therapeutic. INR remains therapeutic today. No further hematuria reported in chart notes.  H/H wnl, plts a little low - defer to MD management. Will continue home dose of warfarin.  Goal of Therapy:  INR 2-3   Plan:  1) Continue current dose of warfar 2) Daily INR 3) Watch for recurrence of hematuria  Darrol Angel, PharmD Pager: [phone] 05/28/2013,10:14 AM

## 2013-05-28 NOTE — Progress Notes (Signed)
[facility] PROGRESS NOTE  Assessment/Plan: E. Coli Urinary tract infection: -continue Rocephin and follow urine sensitivity  -afebrile and WBC's trending down -will follow clinical response -once able to keep things down will transition to oral regimen.  Hypokalemia: - resolved.  Atrial fibrillation/Current use of long term anticoagulation - continue coumadin per pharmacy - rate controlled; will continue atenolol  HYPERTENSION, UNSPECIFIED - Stable.  Toxic encephalopathy/Delirium: - Confused at time as per family member. -worse with UTI -improving and close to baseline -continue PRN Haldol.  GERD -started on PPI  BPH -continue flomax  Physical deconditioning -will ask PT to evaluate and provide rec's  Code Status: full Family Communication: wife at bedside Disposition Plan: to be determine  Consultants:  none  Procedures:  none  Antibiotics:  rocephin 8.25.2014  HPI/Subjective: No fever; patient with episode of vomiting early this morning.  Objective: Filed Vitals:   05/27/13 1700 05/27/13 2121 05/28/13 0330 05/28/13 0528  BP: 139/74 120/72  113/62  Pulse: 96 98  94  Temp: 97.7 F (36.5 C) 98.8 F (37.1 C)  98.4 F (36.9 C)  TempSrc: Oral Oral  Oral  Resp: 18 18  20   Height:      Weight:   115.939 kg (255 lb 9.6 oz)   SpO2:  96%  95%    Intake/Output Summary (Last 24 hours) at 05/28/13 1408 Last data filed at 05/28/13 1328  Gross per 24 hour  Intake   1208 ml  Output    950 ml  Net    258 ml   Filed Weights   05/27/13 0354 05/27/13 0446 05/28/13 0330  Weight: 117.164 kg (258 lb 4.8 oz) 117.174 kg (258 lb 5.2 oz) 115.939 kg (255 lb 9.6 oz)    Exam:  General: Alert, awake, oriented x3, in no acute distress and afebrile.  HEENT: No bruits, no goiter.  Heart: Regular rate and rhythm, without murmurs, rubs, gallops.  Lungs: Good air movement, clear to auscultation. Abdomen: Soft, nontender, nondistended, positive bowel sounds.    Data Reviewed: Basic Metabolic Panel:  Recent Labs Lab 05/26/13 2300 05/27/13 0510  NA 136 135  K 3.2* 4.2  CL 109 99  CO2 17* 28  GLUCOSE 102* 118*  BUN 19 20  CREATININE 0.71 1.16  CALCIUM 6.4* 8.5   Liver Function Tests:  Recent Labs Lab 05/27/13 0510  AST 18  ALT 15  ALKPHOS 75  BILITOT 0.7  PROT 6.1  ALBUMIN 3.3*   CBC:  Recent Labs Lab 05/26/13 2300 05/27/13 0510 05/28/13 0430  WBC 12.3* 23.2* 18.9*  NEUTROABS 11.3* 21.4*  --   HGB 15.3 16.0 14.7  HCT 44.3 46.3 41.9  MCV 92.3 93.2 94.6  PLT 139* 154 115*   CBG:  Recent Labs Lab 05/27/13 0750 05/28/13 0705  GLUCAP 118* 106*    Recent Results (from the past 240 hour(s))  URINE CULTURE     Status: None   Collection Time    05/27/13 12:29 AM      Result Value Range Status   Specimen Description URINE, CATHETERIZED   Final   Special Requests NONE   Final   Culture  Setup Time     Final   Value: 05/27/2013 07:30     Performed at [facility] Count     Final   Value: >=100,000 COLONIES/ML     Performed at [facility]   Culture     Final   Value: ESCHERICHIA COLI  Performed at [facility]   Report Status PENDING   Incomplete  CULTURE, EXPECTORATED SPUTUM-ASSESSMENT     Status: None   Collection Time    05/28/13  9:41 AM      Result Value Range Status   Specimen Description SPUTUM   Final   Special Requests NONE   Final   Sputum evaluation     Final   Value: MICROSCOPIC FINDINGS SUGGEST THAT THIS SPECIMEN IS NOT REPRESENTATIVE OF LOWER RESPIRATORY SECRETIONS. PLEASE RECOLLECT.     CALLED TO J. BURNS RN AT 1005 ON 08.27.14.   Report Status 05/28/2013 FINAL   Final     Studies: Dg Chest 2 View  05/27/2013   *RADIOLOGY REPORT*  Clinical Data: Shortness of breath, hematuria  CHEST - 2 VIEW  Comparison: None.  Findings: Heart size upper normal to mildly enlarged. Aortic atherosclerosis.  Mild bibasilar opacities. Reticulonodular interstitial prominence  and question sequelae of granulomas infection.  No pleural effusion.  No pneumothorax.  Multilevel degenerative changes.  IMPRESSION: Mild bibasilar opacities; atelectasis versus infiltrate.  Reticulonodular interstitial prominence, nonspecific. Most often chronic or atypical/viral infection.  Heart size upper normal to mildly enlarged.   Original Report Authenticated By: Jearld Lesch, M.D.    Scheduled Meds: . atenolol  75 mg Oral Daily  . cefTRIAXone (ROCEPHIN)  IV  1 g Intravenous QHS  . docusate sodium  200 mg Oral BID  . lidocaine   Urethral Once  . sodium chloride  3 mL Intravenous Q12H  . tamsulosin  0.4 mg Oral QPC supper  . [START ON 05/29/2013] warfarin  5 mg Oral Custom  . warfarin  7.5 mg Oral Custom  . Warfarin - Pharmacist Dosing Inpatient   Does not apply q1800   Continuous Infusions:     Avian Konigsberg  [facility] Pager [phone]. If 8PM-8AM, please contact night-coverage at [email or web address], password [facility] 05/28/2013, 2:08 PM  LOS: 2 days

## 2013-05-29 ENCOUNTER — Encounter: Payer: Self-pay | Admitting: Internal Medicine

## 2013-05-29 DIAGNOSIS — R5381 Other malaise: Secondary | ICD-10-CM

## 2013-05-29 LAB — PROTIME-INR
INR: 2.03 — ABNORMAL HIGH (ref 0.00–1.49)
Prothrombin Time: 22.3 seconds — ABNORMAL HIGH (ref 11.6–15.2)

## 2013-05-29 LAB — URINE CULTURE
Colony Count: NO GROWTH
Culture: NO GROWTH

## 2013-05-29 LAB — EXPECTORATED SPUTUM ASSESSMENT W GRAM STAIN, RFLX TO RESP C

## 2013-05-29 LAB — EXPECTORATED SPUTUM ASSESSMENT W REFEX TO RESP CULTURE

## 2013-05-29 LAB — GLUCOSE, CAPILLARY: Glucose-Capillary: 96 mg/dL (ref 70–99)

## 2013-05-29 MED ORDER — TAMSULOSIN HCL 0.4 MG PO CAPS: 0.4000 mg | ORAL_CAPSULE | Freq: Every day | ORAL | Status: DC

## 2013-05-29 MED ORDER — TRAMADOL HCL 50 MG PO TABS: 50.0000 mg | ORAL_TABLET | Freq: Four times a day (QID) | ORAL | Status: DC | PRN

## 2013-05-29 MED ORDER — PANTOPRAZOLE SODIUM 40 MG PO TBEC: 40.0000 mg | DELAYED_RELEASE_TABLET | Freq: Every day | ORAL | Status: DC

## 2013-05-29 MED ORDER — CEFUROXIME AXETIL 500 MG PO TABS: 500.0000 mg | ORAL_TABLET | Freq: Two times a day (BID) | ORAL | Status: AC

## 2013-05-29 MED ORDER — WARFARIN SODIUM 7.5 MG PO TABS
7.5000 mg | ORAL_TABLET | Freq: Once | ORAL | Status: DC
  Filled 2013-05-29: qty 1

## 2013-05-29 NOTE — Progress Notes (Signed)
ANTICOAGULATION CONSULT NOTE - Follow Up  Pharmacy Consult for Warfarin Indication: Hx of AFib  Allergies  Allergen Reactions  . Codeine Other (See Comments)    constpation    Patient Measurements: Height: 6' (182.9 cm) Weight: 255 lb 9.6 oz (115.939 kg) IBW/kg (Calculated) : 77.6  Vital Signs: Temp: 98.4 F (36.9 C) (08/28 0538) Temp src: Oral (08/28 0538) BP: 112/79 mmHg (08/28 0538) Pulse Rate: 87 (08/28 0538)  Labs:  Recent Labs  05/26/13 2300 05/27/13 0510 05/28/13 0430 05/29/13 0425  HGB 15.3 16.0 14.7  --   HCT 44.3 46.3 41.9  --   PLT 139* 154 115*  --   LABPROT  --  23.9* 24.3* 22.3*  INR  --  2.22* 2.27* 2.03*  CREATININE 0.71 1.16  --   --     Estimated Creatinine Clearance: 63.4 ml/min (by C-G formula based on Cr of 1.16).  Assessment: 79 yo M admitted 8/26 with hematuria and UTI. Patient is on chronic warfarin for hx of Afib; home dose reported as 5mg  on SunMonThurFri and 7.5mg  on TuWedSat. Admission INR was therapeutic. INR remains therapeutic today, but on the low end of goal range - will use larger dose tonight. No further hematuria reported in chart notes.  H/H wnl, plts a little low on 8/27 - defer to MD management.   Goal of Therapy:  INR 2-3   Plan:  1) Use larger-than-usual dose of warfarin tonight (7.5mg  instead of 5mg ) 2) Daily INR 3) Watch for recurrence of hematuria  Darrol Angel, PharmD Pager: [phone] 05/29/2013,8:19 AM

## 2013-05-29 NOTE — Evaluation (Signed)
Physical Therapy Evaluation Patient Details Name: Glen [name] MRN: [id number] DOB: 30-May-1928 Today's Date: 05/29/2013 Time: 7829-5621 PT Time Calculation (min): 16 min  PT Assessment / Plan / Recommendation History of Present Illness  79 yo male admitted with UTI, hematuria. Hx of AFIB  Clinical Impression  On eval, pt required Min guard assist for mobility-able to ambulate ~135 feet without assistive device. Demonstrates general weakness and decreased activity tolerance. Recommend HHPT.     PT Assessment  Patient needs continued PT services    Follow Up Recommendations  Home health PT    Does the patient have the potential to tolerate intense rehabilitation      Barriers to Discharge        Equipment Recommendations  None recommended by PT    Recommendations for Other Services     Frequency Min 3X/week    Precautions / Restrictions Precautions Precautions: Fall Restrictions Weight Bearing Restrictions: No   Pertinent Vitals/Pain No c/o pain      Mobility  Bed Mobility Bed Mobility: Supine to Sit Supine to Sit: 6: Modified independent (Device/Increase time) Transfers Transfers: Sit to Stand;Stand to Sit Sit to Stand: 6: Modified independent (Device/Increase time) Stand to Sit: 6: Modified independent (Device/Increase time) Ambulation/Gait Ambulation/Gait Assistance: 4: Min guard Ambulation Distance (Feet): 135 Feet Assistive device: None Gait Pattern: Step-through pattern    Exercises     PT Diagnosis: Generalized weakness;Difficulty walking  PT Problem List: Decreased mobility;Decreased activity tolerance;Decreased strength PT Treatment Interventions: Gait training;Stair training;Functional mobility training;Therapeutic activities;Therapeutic exercise;Patient/family education     PT Goals(Current goals can be found in the care plan section) Acute Rehab PT Goals Patient Stated Goal: home PT Goal Formulation: With patient/family Time For Goal  Achievement: 06/12/13 Potential to Achieve Goals: Good  Visit Information  Last PT Received On: 05/29/13 Assistance Needed: +1 History of Present Illness: 79 yo male admitted with UTI, hematuria. Hx of AFIB       Prior Functioning  Home Living Family/patient expects to be discharged to:: Private residence Living Arrangements: Spouse/significant other (wife unable to provide physical assist) Type of Home: House Home Access: Stairs to enter [employer] of Steps: 2-3 Entrance Stairs-Rails: Right;Left Home Layout: Two level;Able to live on main level with bedroom/bathroom Home Equipment: Dan Humphreys - 2 wheels;Walker - 4 wheels;Wheelchair - manual Additional Comments: wife has DME due to previous back surgery Prior Function Level of Independence: Independent Communication Communication: No difficulties    Cognition  Cognition Arousal/Alertness: Awake/alert Behavior During Therapy: WFL for tasks assessed/performed Overall Cognitive Status: Within Functional Limits for tasks assessed    Extremity/Trunk Assessment Upper Extremity Assessment Upper Extremity Assessment: Overall WFL for tasks assessed Lower Extremity Assessment Lower Extremity Assessment: Generalized weakness Cervical / Trunk Assessment Cervical / Trunk Assessment: Normal   Balance    End of Session PT - End of Session Equipment Utilized During Treatment: Gait belt Activity Tolerance: Patient tolerated treatment well Patient left: in chair;with call bell/phone within reach;with family/visitor present Nurse Communication: Mobility status  GP     Rebeca Alert, MPT Pager: [phone]

## 2013-05-29 NOTE — Discharge Summary (Signed)
Physician Discharge Summary  Glen [name] EXB:[id number] DOB: July 01, 1928 DOA: 05/26/2013  PCP: Rogelia Boga, MD  Admit date: 05/26/2013 Discharge date: 05/29/2013  Time spent: >30  minutes  Recommendations for Outpatient Follow-up:  1. BMET to follow electrolytes and kidney function 2. CBC to follow WBC's trend 3. Follow INR and adjust coumadin dose if needed   Discharge Diagnoses:  E. Coli Urinary tract infection HYPERTENSION, UNSPECIFIED Atrial fibrillation Current use of long term anticoagulation Hypokalemia Toxic encephalopathy due to UTI Leukocytosis GERD BPH  Discharge Condition: stable and improved. Discharge home with [facility] services and under family care. Will follow with PCP in 1 week.  Diet recommendation: heart healthy diet  Filed Weights   05/27/13 0354 05/27/13 0446 05/28/13 0330  Weight: 117.164 kg (258 lb 4.8 oz) 117.174 kg (258 lb 5.2 oz) 115.939 kg (255 lb 9.6 oz)    History of present illness:  79 y.o. male with Past medical history of atrial fibrillation on Coumadin, hypertension, dyslipidemia, obesity.  He presented today with a complaint of burning urination that started today associated with fever and chills and blood in the urine. He had 2 episodes before he came to the ED.  He denies any flank pain, groin pain, scrotal pain.  He denies any similar episodes in the past.  He denies any weight gain or weight loss, or lymphadenopathy. he denies any bleeding anywhere else, nausea, vomiting, abdominal pain.  He denies any shortness of breath or use of oxygen at home. He mentions she is fairly active at his baseline, and gets short of breath when climbing 1 flight of stair, or walking around the yard. He has a cough, and he brings up yellowish expectoration which is worsened over last 1 week.   Hospital Course:  E. Coli Urinary tract infection:  -received 3 days of rocephin -at discharge antibiotics changed to ceftin to finish antibiotic therapy  (will complete a total of 7 more days) -at discharge he was afebrile and WBC's trending down  -no nausea no vomiting and tolerating diet.  Hypokalemia:  - repleted.   Atrial fibrillation/Current use of long term anticoagulation  - continue coumadin and atenolol  -rate control  HYPERTENSION, UNSPECIFIED  - Stable.   Toxic encephalopathy/Delirium:  -intermittent episodes of confusion at home per family member. Most likely underlying demntia -worse with UTI  -improving and at baseline at discharge   GERD  -started on PPI   BPH  -continue flomax   Physical deconditioning  -PT has recommended HHPT; arranged at discharge.  *Rest of medical problems remains stable and the plan is to continue current medication regimen.   Procedures:  See below for x-ray reports  Consultations:  None   Discharge Exam: Filed Vitals:   05/29/13 0538  BP: 112/79  Pulse: 87  Temp: 98.4 F (36.9 C)  Resp: 18   General: Alert, awake, oriented x3, in no acute distress and afebrile.  HEENT: No bruits, no goiter.  Heart: Regular rate and rhythm, without murmurs, rubs, gallops.  Lungs: Good air movement, clear to auscultation.  Abdomen: Soft, nontender, nondistended, positive bowel sounds.   Discharge Instructions  Discharge Orders   Future Appointments Provider Department Dept Phone   06/06/2013 2:45 PM Cassell Clement, MD [facility] [place]) [phone]   07/07/2013 8:30 AM [facility] Coumadin [facility] [place] [phone]   10/17/2013 8:00 AM Gordy Savers, MD [facility] [place] [phone]   Future Orders Complete By Expires   Discharge instructions  As directed  Comments:     Keep yourself well hydrated Take medications as prescribed Follow with PCP in 1 week Follow a heart healthy diet       Medication List         acetaminophen 500 MG tablet  Commonly known as:  TYLENOL  Take 1,000 mg by mouth every 6 (six) hours as  needed for pain.     atenolol 50 MG tablet  Commonly known as:  TENORMIN  Take 1.5 tablets (75 mg total) by mouth daily.     cefUROXime 500 MG tablet  Commonly known as:  CEFTIN  Take 1 tablet (500 mg total) by mouth 2 (two) times daily.     pantoprazole 40 MG tablet  Commonly known as:  PROTONIX  Take 1 tablet (40 mg total) by mouth daily at 12 noon.     PROBIOTIC DAILY PO  Take 1 capsule by mouth daily.     tamsulosin 0.4 MG Caps capsule  Commonly known as:  FLOMAX  Take 1 capsule (0.4 mg total) by mouth daily after supper.     traMADol 50 MG tablet  Commonly known as:  ULTRAM  Take 1 tablet (50 mg total) by mouth every 6 (six) hours as needed for pain.     warfarin 5 MG tablet  Commonly known as:  COUMADIN  - Take 5-7.5 mg by mouth See admin instructions. Patient takes 7.5 mg Tue, Wed, Sat.  - 5 mg Su,Mo,Thur,Fri       Allergies  Allergen Reactions  . Codeine Other (See Comments)    constpation       Follow-up Information   Follow up with Rogelia Boga, MD. Schedule an appointment as soon as possible for a visit in 1 week.   Specialty:  Internal Medicine   Contact information:   [address] [place] [address] [phone]        The results of significant diagnostics from this hospitalization (including imaging, microbiology, ancillary and laboratory) are listed below for reference.    Significant Diagnostic Studies: Dg Chest 2 View  05/27/2013   *RADIOLOGY REPORT*  Clinical Data: Shortness of breath, hematuria  CHEST - 2 VIEW  Comparison: None.  Findings: Heart size upper normal to mildly enlarged. Aortic atherosclerosis.  Mild bibasilar opacities. Reticulonodular interstitial prominence and question sequelae of granulomas infection.  No pleural effusion.  No pneumothorax.  Multilevel degenerative changes.  IMPRESSION: Mild bibasilar opacities; atelectasis versus infiltrate.  Reticulonodular interstitial prominence, nonspecific. Most  often chronic or atypical/viral infection.  Heart size upper normal to mildly enlarged.   Original Report Authenticated By: Jearld Lesch, M.D.    Microbiology: Recent Results (from the past 240 hour(s))  URINE CULTURE     Status: None   Collection Time    05/27/13 12:29 AM      Result Value Range Status   Specimen Description URINE, CATHETERIZED   Final   Special Requests NONE   Final   Culture  Setup Time     Final   Value: 05/27/2013 07:30     Performed at [facility] Count     Final   Value: >=100,000 COLONIES/ML     Performed at [facility]   Culture     Final   Value: ESCHERICHIA COLI     Performed at [facility]   Report Status 05/28/2013 FINAL   Final   Organism ID, Bacteria ESCHERICHIA COLI   Final  URINE CULTURE  Status: None   Collection Time    05/28/13  2:01 AM      Result Value Range Status   Specimen Description URINE, RANDOM   Final   Special Requests NONE   Final   Culture  Setup Time     Final   Value: 05/28/2013 11:13     Performed at [facility] Count     Final   Value: NO GROWTH     Performed at [facility]   Culture     Final   Value: NO GROWTH     Performed at [facility]   Report Status 05/29/2013 FINAL   Final  CULTURE, EXPECTORATED SPUTUM-ASSESSMENT     Status: None   Collection Time    05/28/13  9:41 AM      Result Value Range Status   Specimen Description SPUTUM   Final   Special Requests NONE   Final   Sputum evaluation     Final   Value: MICROSCOPIC FINDINGS SUGGEST THAT THIS SPECIMEN IS NOT REPRESENTATIVE OF LOWER RESPIRATORY SECRETIONS. PLEASE RECOLLECT.     CALLED TO J. BURNS RN AT [phone] ON 08.27.14.   Report Status 05/28/2013 FINAL   Final  CULTURE, EXPECTORATED SPUTUM-ASSESSMENT     Status: None   Collection Time    05/29/13  4:22 AM      Result Value Range Status   Specimen Description SPUTUM   Final   Special Requests NONE   Final   Sputum evaluation      Final   Value: MICROSCOPIC FINDINGS SUGGEST THAT THIS SPECIMEN IS NOT REPRESENTATIVE OF LOWER RESPIRATORY SECRETIONS. PLEASE RECOLLECT.     Leafy Kindle RN AT [phone] ON 08.28.14 BY SHUEA   Report Status 05/29/2013 FINAL   Final     Labs: Basic Metabolic Panel:  Recent Labs Lab 05/26/13 2300 05/27/13 0510  NA 136 135  K 3.2* 4.2  CL 109 99  CO2 17* 28  GLUCOSE 102* 118*  BUN 19 20  CREATININE 0.71 1.16  CALCIUM 6.4* 8.5   Liver Function Tests:  Recent Labs Lab 05/27/13 0510  AST 18  ALT 15  ALKPHOS 75  BILITOT 0.7  PROT 6.1  ALBUMIN 3.3*   CBC:  Recent Labs Lab 05/26/13 2300 05/27/13 0510 05/28/13 0430  WBC 12.3* 23.2* 18.9*  NEUTROABS 11.3* 21.4*  --   HGB 15.3 16.0 14.7  HCT 44.3 46.3 41.9  MCV 92.3 93.2 94.6  PLT 139* 154 115*   CBG:  Recent Labs Lab 05/27/13 0750 05/28/13 0705 05/29/13 0735  GLUCAP 118* 106* 96    Signed:  Glennon Kopko  [facility] 05/29/2013, 12:55 PM

## 2013-05-30 ENCOUNTER — Telehealth: Payer: Self-pay | Admitting: Internal Medicine

## 2013-05-30 ENCOUNTER — Other Ambulatory Visit: Payer: Self-pay | Admitting: Internal Medicine

## 2013-05-30 DIAGNOSIS — N390 Urinary tract infection, site not specified: Secondary | ICD-10-CM

## 2013-05-30 NOTE — Telephone Encounter (Signed)
Patient calling back again regarding his My Chart message from yesterday. He is asking if Urology referral is placed yet. States he is still feeling poorly and would like it right away. Please reference My Chart email from yesterday. Thank you.

## 2013-05-30 NOTE — Telephone Encounter (Signed)
Spoke to pt told him referral for Urology was sent and someone will be contacting him. Pt verbalized understanding.

## 2013-06-04 ENCOUNTER — Ambulatory Visit (INDEPENDENT_AMBULATORY_CARE_PROVIDER_SITE_OTHER): Payer: Medicare Other | Admitting: Internal Medicine

## 2013-06-04 ENCOUNTER — Encounter: Payer: Self-pay | Admitting: Internal Medicine

## 2013-06-04 VITALS — BP 110/70 | HR 107 | Temp 98.4°F | Resp 20 | Wt 248.0 lb

## 2013-06-04 DIAGNOSIS — I4891 Unspecified atrial fibrillation: Secondary | ICD-10-CM

## 2013-06-04 DIAGNOSIS — H6120 Impacted cerumen, unspecified ear: Secondary | ICD-10-CM

## 2013-06-04 DIAGNOSIS — I10 Essential (primary) hypertension: Secondary | ICD-10-CM

## 2013-06-04 DIAGNOSIS — N390 Urinary tract infection, site not specified: Secondary | ICD-10-CM

## 2013-06-04 DIAGNOSIS — Z7901 Long term (current) use of anticoagulants: Secondary | ICD-10-CM

## 2013-06-04 DIAGNOSIS — H6121 Impacted cerumen, right ear: Secondary | ICD-10-CM

## 2013-06-04 NOTE — Progress Notes (Signed)
Subjective:    Patient ID: Glen [name], male    DOB: 1928/04/18, 79 y.o.   MRN: [id number]  HPI  79 year old patient who has a history of hypertension and atrial fibrillation. He is seen today post hospital discharge following a UTI with early urosepsis. He presented with fever chills and delirium. He was treated with Rocephin for 3 days and is completing Ceftin antibiotic therapy. He generally feels well but still having some mild fatigue. His chief complaint is decreased auditory acuity from the right ear. He has required cerumen impaction on the right in 2010. He was placed on Flomax recently due 2 decreased force of urinary stream. He is scheduled for urology followup later this week  Hospital records reviewed  Past Medical History  Diagnosis Date  . Atrial fibrillation   . Drug therapy     Anticoagulation Therapy  . Palpitation   . Hx of echocardiogram     a. Echo 5/11:  EF 55-60%, mild dilated ascending aorta, mod LAE  . Hypertension   . Hyperlipidemia   . Obesity   . Neoplasm of prostate     special screening malignant   . Diverticulitis   . History of colonic polyps   . FHx: colon cancer   . Family history of diabetes insipidus     History   Social History  . Marital Status: Married    Spouse Name: N/A    Number of Children: N/A  . Years of Education: N/A   Occupational History  . Retired    Social History Main Topics  . Smoking status: Never Smoker   . Smokeless tobacco: Never Used  . Alcohol Use: No  . Drug Use: Not on file  . Sexual Activity: Not on file   Other Topics Concern  . Not on file   Social History Narrative   Regular exercise: Yes: [employer] 4 times/week    Past Surgical History  Procedure Laterality Date  . Colonoscopy  December 2005  . Anal fistula repair    . Knee arthroscopy      Right  . Tonsillectomy    . Ett  1991    Negative  . Cataract extraction  2004  . Cardiac valve surgery  1996    status post balloon valvuloplasty at  [facility]  . 2-d echocardiogram  November 2009, 2011    Family History  Problem Relation Age of Onset  . Colon cancer    . Diabetes    . Prostate cancer Brother   . Heart disease    . Heart attack Father   . Coronary artery disease      Two siblings, status post stenting to siblings with heart disease. One. Status post pacemaker insertion.    Allergies  Allergen Reactions  . Codeine Other (See Comments)    constpation    Current Outpatient Prescriptions on File Prior to Visit  Medication Sig Dispense Refill  . acetaminophen (TYLENOL) 500 MG tablet Take 1,000 mg by mouth every 6 (six) hours as needed for pain.       Marland Kitchen atenolol (TENORMIN) 50 MG tablet Take 1.5 tablets (75 mg total) by mouth daily.  45 tablet  11  . cefUROXime (CEFTIN) 500 MG tablet Take 1 tablet (500 mg total) by mouth 2 (two) times daily.  14 tablet  0  . Probiotic Product (PROBIOTIC DAILY PO) Take 1 capsule by mouth daily.      . tamsulosin (FLOMAX) 0.4 MG CAPS capsule Take 1 capsule (0.4 mg  total) by mouth daily after supper.  30 capsule  1  . traMADol (ULTRAM) 50 MG tablet Take 1 tablet (50 mg total) by mouth every 6 (six) hours as needed for pain.  40 tablet  0  . warfarin (COUMADIN) 5 MG tablet Take 5-7.5 mg by mouth See admin instructions. Patient takes 7.5 mg Tue, Wed, Sat. 5 mg Su,Mo,Thur,Fri       No current facility-administered medications on file prior to visit.    BP 110/70  Pulse 107  Temp(Src) 98.4 F (36.9 C) (Oral)  Resp 20  Wt 248 lb (112.492 kg)  BMI 33.63 kg/m2  SpO2 97%       Review of Systems  Constitutional: Positive for fatigue. Negative for fever, chills and appetite change.  HENT: Positive for hearing loss. Negative for ear pain, congestion, sore throat, trouble swallowing, neck stiffness, dental problem, voice change and tinnitus.   Eyes: Negative for pain, discharge and visual disturbance.  Respiratory: Negative for cough, chest tightness, wheezing and stridor.    Cardiovascular: Negative for chest pain, palpitations and leg swelling.  Gastrointestinal: Negative for nausea, vomiting, abdominal pain, diarrhea, constipation, blood in stool and abdominal distention.  Genitourinary: Negative for urgency, hematuria, flank pain, discharge, difficulty urinating and genital sores.  Musculoskeletal: Negative for myalgias, back pain, joint swelling, arthralgias and gait problem.  Skin: Negative for rash.  Neurological: Negative for dizziness, syncope, speech difficulty, weakness, numbness and headaches.  Hematological: Negative for adenopathy. Does not bruise/bleed easily.  Psychiatric/Behavioral: Negative for behavioral problems and dysphoric mood. The patient is not nervous/anxious.        Objective:   Physical Exam  Constitutional: He is oriented to person, place, and time. He appears well-developed.  Afebrile Blood pressure low normal  HENT:  Head: Normocephalic.  Right Ear: External ear normal.  Left Ear: External ear normal.  Cerumen right canal  Eyes: Conjunctivae and EOM are normal.  Neck: Normal range of motion.  Cardiovascular: Normal rate and normal heart sounds.   Pulmonary/Chest: Breath sounds normal.  Abdominal: Bowel sounds are normal.  Musculoskeletal: Normal range of motion. He exhibits no edema and no tenderness.  Neurological: He is alert and oriented to person, place, and time.  Psychiatric: He has a normal mood and affect. His behavior is normal.          Assessment & Plan:   History of UTI status post early urosepsis. Clinically improved. We'll complete the antibiotic therapy Cerumen impaction right ear. We'll irrigate until clear Hypertension stable. Blood pressure low normal Atrial fibrillation. Continue Coumadin anticoagulation  Recheck 6 months

## 2013-06-06 ENCOUNTER — Encounter: Payer: Self-pay | Admitting: Cardiology

## 2013-06-06 ENCOUNTER — Ambulatory Visit (INDEPENDENT_AMBULATORY_CARE_PROVIDER_SITE_OTHER): Payer: Medicare Other | Admitting: Cardiology

## 2013-06-06 VITALS — BP 132/78 | HR 68 | Ht 72.0 in | Wt 249.0 lb

## 2013-06-06 DIAGNOSIS — N390 Urinary tract infection, site not specified: Secondary | ICD-10-CM

## 2013-06-06 DIAGNOSIS — I4891 Unspecified atrial fibrillation: Secondary | ICD-10-CM | POA: Diagnosis not present

## 2013-06-06 DIAGNOSIS — I10 Essential (primary) hypertension: Secondary | ICD-10-CM

## 2013-06-06 DIAGNOSIS — R310 Gross hematuria: Secondary | ICD-10-CM | POA: Diagnosis not present

## 2013-06-06 DIAGNOSIS — R351 Nocturia: Secondary | ICD-10-CM | POA: Diagnosis not present

## 2013-06-06 NOTE — Progress Notes (Signed)
Glen [name] Date of Birth:  [birth year] [facility] [address] [place], [place]  [address] [phone]         Fax   [phone]  History of Present Illness: This pleasant 79 year old gentleman is seen by me for the first time as the patient today.  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  The patient himself is a former patient of Dr. Juanito Doom.  Patient has a history of permanent atrial fibrillation.  He also has a history of hypertension and hyperlipidemia.  His CHADSS score equals 2 for age and hypertension.  His Coumadin is followed by his PCP.  The patient does not have any history of congestive heart failure.  He had an echocardiogram in May 2011 showing an ejection fraction of 55-60%.  His atrial fibrillation has been managed with rate control.  He has never had attempts at cardioversion.  He had a previous attempt to switch him from beta blocker to diltiazem.  However he developed significant periodontal disease and swelling of his gingiva from the diltiazem.  He is now back on his beta blocker and is feeling well. Current Outpatient Prescriptions  Medication Sig Dispense Refill  . acetaminophen (TYLENOL) 500 MG tablet Take 1,000 mg by mouth every 6 (six) hours as needed for pain.       Marland Kitchen atenolol (TENORMIN) 50 MG tablet Take 1.5 tablets (75 mg total) by mouth daily.  45 tablet  11  . Probiotic Product (PROBIOTIC DAILY PO) Take 1 capsule by mouth daily.      . traMADol (ULTRAM) 50 MG tablet Take 1 tablet (50 mg total) by mouth every 6 (six) hours as needed for pain.  40 tablet  0  . warfarin (COUMADIN) 5 MG tablet Take 5-7.5 mg by mouth See admin instructions. Patient takes 7.5 mg Tue, Wed, Sat. 5 mg Su,Mo,Thur,Fri       No current facility-administered medications for this visit.    Allergies  Allergen Reactions  . Codeine Other (See Comments)    constpation  . Diltiazem    Gingival hyperplasia    Patient Active Problem List   Diagnosis Date Noted  . Urinary tract infection 05/27/2013  . Hypokalemia 05/27/2013  . Current use of long term anticoagulation 06/14/2011  . CERUMEN IMPACTION, RIGHT 08/11/2009  . OBESITY 01/20/2009  . HYPERTENSION, UNSPECIFIED 01/20/2009  . PALPITATIONS 01/20/2009  . Atrial fibrillation 03/13/2008  . HYPERLIPIDEMIA 07/31/2007  . COLONIC POLYPS, HX OF 07/31/2007  . DIVERTICULITIS, HX OF 07/31/2007    History  Smoking status  . Never Smoker   Smokeless tobacco  . Never Used    History  Alcohol Use No    Family History  Problem Relation Age of Onset  . Colon cancer    . Diabetes    . Prostate cancer Brother   . Heart disease    . Heart attack Father   . Coronary artery disease      Two siblings, status post stenting to siblings with heart disease. One. Status post pacemaker insertion.    Review of Systems: Constitutional: no fever chills diaphoresis or fatigue or change in weight.  Head and neck: no hearing loss, no epistaxis, no photophobia or visual disturbance. Respiratory: No cough, shortness of breath or wheezing. Cardiovascular: No chest pain peripheral edema, palpitations. Gastrointestinal: No abdominal distention, no abdominal pain, no change in bowel habits hematochezia or melena. Genitourinary:  No dysuria, no frequency, no urgency, no nocturia. Musculoskeletal:No arthralgias, no back pain, no gait disturbance or myalgias. Neurological: No dizziness, no headaches, no numbness, no seizures, no syncope, no weakness, no tremors. Hematologic: No lymphadenopathy, no easy bruising. Psychiatric: No confusion, no hallucinations, no sleep disturbance.    Physical Exam: Filed Vitals:   06/06/13 1439  BP: 132/78  Pulse: 68   the general appearance reveals a large gentleman in no distress.The head and neck exam reveals pupils equal and reactive.  Extraocular movements are full.  There is no scleral icterus.   The mouth and pharynx are normal.  The neck is supple.  The carotids reveal no bruits.  The jugular venous pressure is normal.  The  thyroid is not enlarged.  There is no lymphadenopathy.  The chest is clear to percussion and auscultation.  There are no rales or rhonchi.  Expansion of the chest is symmetrical.  The precordium is quiet.  The pulse is irregularly irregular The first heart sound is normal.  The second heart sound is physiologically split.  There is no murmur gallop rub or click.  There is no abnormal lift or heave.  The abdomen is soft and nontender.  The bowel sounds are normal.  The liver and spleen are not enlarged.  There are no abdominal masses.  There are no abdominal bruits.  Extremities reveal good pedal pulses.  There is no phlebitis or edema.  There is no cyanosis or clubbing.  Strength is normal and symmetrical in all extremities.  There is no lateralizing weakness.  There are no sensory deficits.  The skin is warm and dry.  There is no rash.     Assessment / Plan: The patient is to continue on his same medication.  Recheck in 6 months for followup office visit and EKG.

## 2013-06-06 NOTE — Assessment & Plan Note (Signed)
The patient was recently hospitalized for a severe Escherichia coli urinary tract infection.  This was approximately 2 weeks ago.  He is followed closely by urology

## 2013-06-06 NOTE — Assessment & Plan Note (Signed)
The patient has not been experiencing any TIA or stroke symptoms.  No problems from his Coumadin

## 2013-06-06 NOTE — Assessment & Plan Note (Signed)
Blood pressure has been well maintained on present dose of atenolol 75 mg daily.  The patient denies any symptoms of congestive heart failure.  No palpitations or awareness of heart pounding.  No dizziness or syncope.  The patient exercises 4 days a week at the [employer].

## 2013-06-06 NOTE — Patient Instructions (Addendum)
Your physician recommends that you continue on your current medications as directed. Please refer to the Current Medication list given to you today.  Your physician wants you to follow-up in: 6 month ov/ekg You will receive a reminder letter in the mail two months in advance. If you don't receive a letter, please call our office to schedule the follow-up appointment.  

## 2013-06-09 ENCOUNTER — Telehealth: Payer: Self-pay | Admitting: Internal Medicine

## 2013-06-09 NOTE — Telephone Encounter (Signed)
Physical therapist called and stated that the pt stated that he was fine and did not require anytime of therapy. FYI.

## 2013-06-11 DIAGNOSIS — N400 Enlarged prostate without lower urinary tract symptoms: Secondary | ICD-10-CM | POA: Diagnosis not present

## 2013-06-11 DIAGNOSIS — R310 Gross hematuria: Secondary | ICD-10-CM | POA: Diagnosis not present

## 2013-06-11 DIAGNOSIS — N281 Cyst of kidney, acquired: Secondary | ICD-10-CM | POA: Diagnosis not present

## 2013-06-18 ENCOUNTER — Ambulatory Visit: Payer: Medicare Other | Admitting: Cardiology

## 2013-06-24 ENCOUNTER — Encounter: Payer: Self-pay | Admitting: Internal Medicine

## 2013-06-26 DIAGNOSIS — IMO0002 Reserved for concepts with insufficient information to code with codable children: Secondary | ICD-10-CM | POA: Diagnosis not present

## 2013-06-26 DIAGNOSIS — M1710 Unilateral primary osteoarthritis, unspecified knee: Secondary | ICD-10-CM | POA: Diagnosis not present

## 2013-07-07 ENCOUNTER — Ambulatory Visit (INDEPENDENT_AMBULATORY_CARE_PROVIDER_SITE_OTHER): Payer: Medicare Other | Admitting: General Practice

## 2013-07-07 DIAGNOSIS — I4891 Unspecified atrial fibrillation: Secondary | ICD-10-CM | POA: Diagnosis not present

## 2013-07-07 DIAGNOSIS — Z23 Encounter for immunization: Secondary | ICD-10-CM

## 2013-07-07 LAB — POCT INR: INR: 3.3

## 2013-08-04 ENCOUNTER — Ambulatory Visit (INDEPENDENT_AMBULATORY_CARE_PROVIDER_SITE_OTHER): Payer: Medicare Other | Admitting: General Practice

## 2013-08-04 DIAGNOSIS — I4891 Unspecified atrial fibrillation: Secondary | ICD-10-CM | POA: Diagnosis not present

## 2013-08-04 LAB — POCT INR: INR: 4.3

## 2013-08-21 ENCOUNTER — Encounter: Payer: Self-pay | Admitting: Internal Medicine

## 2013-08-21 NOTE — Telephone Encounter (Signed)
Please call pt to reschedule lab appt.

## 2013-08-25 ENCOUNTER — Ambulatory Visit: Payer: Medicare Other

## 2013-09-01 ENCOUNTER — Ambulatory Visit (INDEPENDENT_AMBULATORY_CARE_PROVIDER_SITE_OTHER): Payer: Medicare Other | Admitting: General Practice

## 2013-09-01 DIAGNOSIS — I4891 Unspecified atrial fibrillation: Secondary | ICD-10-CM

## 2013-09-01 LAB — POCT INR: INR: 3.9

## 2013-09-01 NOTE — Progress Notes (Signed)
Pre-visit discussion using our clinic review tool. No additional management support is needed unless otherwise documented below in the visit note.  

## 2013-09-10 DIAGNOSIS — M1710 Unilateral primary osteoarthritis, unspecified knee: Secondary | ICD-10-CM | POA: Diagnosis not present

## 2013-09-10 DIAGNOSIS — IMO0002 Reserved for concepts with insufficient information to code with codable children: Secondary | ICD-10-CM | POA: Diagnosis not present

## 2013-09-29 ENCOUNTER — Ambulatory Visit (INDEPENDENT_AMBULATORY_CARE_PROVIDER_SITE_OTHER): Payer: Medicare Other | Admitting: General Practice

## 2013-09-29 DIAGNOSIS — I4891 Unspecified atrial fibrillation: Secondary | ICD-10-CM | POA: Diagnosis not present

## 2013-09-29 LAB — POCT INR: INR: 3.6

## 2013-09-29 NOTE — Progress Notes (Signed)
Pre-visit discussion using our clinic review tool. No additional management support is needed unless otherwise documented below in the visit note.  

## 2013-10-17 ENCOUNTER — Encounter: Payer: Self-pay | Admitting: Internal Medicine

## 2013-10-17 ENCOUNTER — Ambulatory Visit (INDEPENDENT_AMBULATORY_CARE_PROVIDER_SITE_OTHER): Payer: Medicare Other | Admitting: Internal Medicine

## 2013-10-17 VITALS — BP 110/70 | HR 93 | Resp 20 | Ht 72.0 in | Wt 249.0 lb

## 2013-10-17 DIAGNOSIS — I4891 Unspecified atrial fibrillation: Secondary | ICD-10-CM | POA: Diagnosis not present

## 2013-10-17 DIAGNOSIS — I10 Essential (primary) hypertension: Secondary | ICD-10-CM | POA: Diagnosis not present

## 2013-10-17 DIAGNOSIS — E785 Hyperlipidemia, unspecified: Secondary | ICD-10-CM

## 2013-10-17 DIAGNOSIS — Z23 Encounter for immunization: Secondary | ICD-10-CM | POA: Diagnosis not present

## 2013-10-17 NOTE — Patient Instructions (Signed)
Limit your sodium (Salt) intake    It is important that you exercise regularly, at least 20 minutes 3 to 4 times per week.  If you develop chest pain or shortness of breath seek  medical attention.  You need to lose weight.  Consider a lower calorie diet and regular exercise.  Return in 6 months for follow-up   

## 2013-10-17 NOTE — Progress Notes (Signed)
Subjective:    Patient ID: Glen [name], male    DOB: 04/15/1928, 80 y.o.   MRN: [id number]  HPI  80 year old patient who is in today for followup. He has a history of permanent atrial for ablation and hypertension. He does remarkably well at 15. No concerns or complaints. He did see cardiology about 4 months ago. He remains on Coumadin anticoagulation and has been compliant with followup. No cardiopulmonary complaints.  Past Medical History  Diagnosis Date  . Atrial fibrillation   . Drug therapy     Anticoagulation Therapy  . Palpitation   . Hx of echocardiogram     a. Echo 5/11:  EF 55-60%, mild dilated ascending aorta, mod LAE  . Hypertension   . Hyperlipidemia   . Obesity   . Neoplasm of prostate     special screening malignant   . Diverticulitis   . History of colonic polyps   . FHx: colon cancer   . Family history of diabetes insipidus     History   Social History  . Marital Status: Married    Spouse Name: N/A    Number of Children: N/A  . Years of Education: N/A   Occupational History  . Retired    Social History Main Topics  . Smoking status: Never Smoker   . Smokeless tobacco: Never Used  . Alcohol Use: No  . Drug Use: Not on file  . Sexual Activity: Not on file   Other Topics Concern  . Not on file   Social History Narrative   Regular exercise: Yes: [employer] 4 times/week    Past Surgical History  Procedure Laterality Date  . Colonoscopy  December 2005  . Anal fistula repair    . Knee arthroscopy      Right  . Tonsillectomy    . Ett  1991    Negative  . Cataract extraction  2004  . Cardiac valve surgery  1996    status post balloon valvuloplasty at [facility]  . 2-d echocardiogram  November 2009, 2011    Family History  Problem Relation Age of Onset  . Colon cancer    . Diabetes    . Prostate cancer Brother   . Heart disease    . Heart attack Father   . Coronary artery disease      Two siblings, status post stenting to siblings with heart  disease. One. Status post pacemaker insertion.    Allergies  Allergen Reactions  . Codeine Other (See Comments)    constpation  . Diltiazem     Gingival hyperplasia    Current Outpatient Prescriptions on File Prior to Visit  Medication Sig Dispense Refill  . acetaminophen (TYLENOL) 500 MG tablet Take 1,000 mg by mouth every 6 (six) hours as needed for pain.       Marland Kitchen atenolol (TENORMIN) 50 MG tablet Take 1.5 tablets (75 mg total) by mouth daily.  45 tablet  11  . Probiotic Product (PROBIOTIC DAILY PO) Take 1 capsule by mouth daily.      . traMADol (ULTRAM) 50 MG tablet Take 1 tablet (50 mg total) by mouth every 6 (six) hours as needed for pain.  40 tablet  0  . warfarin (COUMADIN) 5 MG tablet Take 5 mg by mouth daily at 6 PM.        No current facility-administered medications on file prior to visit.    BP 110/70  Pulse 93  Resp 20  Ht 6' (1.829 m)  Ivey  249 lb (112.946 kg)  BMI 33.76 kg/m2  SpO2 96%      Review of Systems  Constitutional: Negative for fever, chills, appetite change and fatigue.  HENT: Negative for congestion, dental problem, ear pain, hearing loss, sore throat, tinnitus, trouble swallowing and voice change.   Eyes: Negative for pain, discharge and visual disturbance.  Respiratory: Negative for cough, chest tightness, wheezing and stridor.   Cardiovascular: Negative for chest pain, palpitations and leg swelling.  Gastrointestinal: Negative for nausea, vomiting, abdominal pain, diarrhea, constipation, blood in stool and abdominal distention.  Genitourinary: Negative for urgency, hematuria, flank pain, discharge, difficulty urinating and genital sores.  Musculoskeletal: Negative for arthralgias, back pain, gait problem, joint swelling, myalgias and neck stiffness.  Skin: Negative for rash.  Neurological: Negative for dizziness, syncope, speech difficulty, weakness, numbness and headaches.  Hematological: Negative for adenopathy. Does not bruise/bleed easily.    Psychiatric/Behavioral: Negative for behavioral problems and dysphoric mood. The patient is not nervous/anxious.        Objective:   Physical Exam  Constitutional: He is oriented to person, place, and time. He appears well-developed.  HENT:  Head: Normocephalic.  Right Ear: External ear normal.  Left Ear: External ear normal.  Eyes: Conjunctivae and EOM are normal.  Neck: Normal range of motion.  Cardiovascular: Normal rate and normal heart sounds.   Controlled ventricular response  Pulmonary/Chest: Breath sounds normal.  Abdominal: Bowel sounds are normal.  Musculoskeletal: Normal range of motion. He exhibits no edema and no tenderness.  Neurological: He is alert and oriented to person, place, and time.  Psychiatric: He has a normal mood and affect. His behavior is normal.          Assessment & Plan:   Permanent atrial fibrillation Hypertension well controlled Coumadin anticoagulation Exogenous obesity  CPX 6 months

## 2013-10-17 NOTE — Progress Notes (Signed)
Pre-visit discussion using our clinic review tool. No additional management support is needed unless otherwise documented below in the visit note.  

## 2013-10-18 ENCOUNTER — Telehealth: Payer: Self-pay | Admitting: Internal Medicine

## 2013-10-18 NOTE — Telephone Encounter (Signed)
Relevant patient education assigned to patient using Emmi. ° °

## 2013-10-20 ENCOUNTER — Encounter: Payer: Self-pay | Admitting: Internal Medicine

## 2013-10-20 ENCOUNTER — Other Ambulatory Visit: Payer: Self-pay | Admitting: *Deleted

## 2013-10-20 NOTE — Telephone Encounter (Signed)
Spoke to pt told him Dr. Kathlen Mody sent in Rx for Atenolol year supply to pharmacy and Coumadin has to be filled by [facility]. When is your next appt? Pt stated next week. Told him okay let Jenny Reichmann know you need a refill and she will send it. Pt verbalized understanding.

## 2013-10-27 ENCOUNTER — Ambulatory Visit (INDEPENDENT_AMBULATORY_CARE_PROVIDER_SITE_OTHER): Payer: Medicare Other | Admitting: General Practice

## 2013-10-27 DIAGNOSIS — Z5181 Encounter for therapeutic drug level monitoring: Secondary | ICD-10-CM

## 2013-10-27 DIAGNOSIS — I4891 Unspecified atrial fibrillation: Secondary | ICD-10-CM

## 2013-10-27 LAB — POCT INR: INR: 1.9

## 2013-10-27 NOTE — Progress Notes (Signed)
Pre-visit discussion using our clinic review tool. No additional management support is needed unless otherwise documented below in the visit note.  

## 2013-11-24 ENCOUNTER — Ambulatory Visit (INDEPENDENT_AMBULATORY_CARE_PROVIDER_SITE_OTHER): Payer: Medicare Other | Admitting: General Practice

## 2013-11-24 DIAGNOSIS — I4891 Unspecified atrial fibrillation: Secondary | ICD-10-CM | POA: Diagnosis not present

## 2013-11-24 LAB — POCT INR: INR: 1.8

## 2013-11-24 NOTE — Progress Notes (Signed)
Pre visit review using our clinic review tool, if applicable. No additional management support is needed unless otherwise documented below in the visit note. 

## 2013-12-08 ENCOUNTER — Ambulatory Visit (INDEPENDENT_AMBULATORY_CARE_PROVIDER_SITE_OTHER): Payer: Medicare Other | Admitting: Cardiology

## 2013-12-08 ENCOUNTER — Encounter: Payer: Self-pay | Admitting: Cardiology

## 2013-12-08 VITALS — BP 118/68 | HR 105 | Ht 72.0 in | Wt 248.0 lb

## 2013-12-08 DIAGNOSIS — R0600 Dyspnea, unspecified: Secondary | ICD-10-CM

## 2013-12-08 DIAGNOSIS — R0989 Other specified symptoms and signs involving the circulatory and respiratory systems: Secondary | ICD-10-CM | POA: Diagnosis not present

## 2013-12-08 DIAGNOSIS — R0609 Other forms of dyspnea: Secondary | ICD-10-CM | POA: Diagnosis not present

## 2013-12-08 DIAGNOSIS — I10 Essential (primary) hypertension: Secondary | ICD-10-CM

## 2013-12-08 DIAGNOSIS — I4891 Unspecified atrial fibrillation: Secondary | ICD-10-CM

## 2013-12-08 DIAGNOSIS — E785 Hyperlipidemia, unspecified: Secondary | ICD-10-CM

## 2013-12-08 NOTE — Assessment & Plan Note (Addendum)
The patient states that he does not have and has never had hypertension although his prior records indicate essential hypertension as one of his chronic diagnoses.  His blood pressure today is well within normal range probably reflecting the fact that his beta blocker which is also being used for rate control.

## 2013-12-08 NOTE — Assessment & Plan Note (Signed)
The patient is in permanent atrial fibrillation.  He has not been having any TIA symptoms.  His Coumadin has been stable and is followed by his PCP.

## 2013-12-08 NOTE — Assessment & Plan Note (Signed)
Patient has mild dyspnea on exertion.  This has not changed or increased since last visit.  The patient sleeps on one fell out and is not having any orthopnea or paroxysmal nocturnal dyspnea or ankle edema.

## 2013-12-08 NOTE — Patient Instructions (Signed)
Your physician recommends that you continue on your current medications as directed. Please refer to the Current Medication list given to you today.  Your physician wants you to follow-up in: 6 month ov You will receive a reminder letter in the mail two months in advance. If you don't receive a letter, please call our office to schedule the follow-up appointment.  

## 2013-12-08 NOTE — Progress Notes (Signed)
Glen [name] Date of Birth:  [birth year] [address] [place], [place]  [address] [phone]         Fax   [phone]  History of Present Illness: This pleasant 80 year old gentleman is seen for a six-month followup office visit.    Patient has a history of permanent atrial fibrillation.  He also has a history of hypertension and hyperlipidemia.  His CHADSS score equals 2 for age and hypertension.  His Coumadin is followed by his PCP.  The patient does not have any history of congestive heart failure.  He had an echocardiogram in May 2011 showing an ejection fraction of 55-60%.  His atrial fibrillation has been managed with rate control.  He has never had attempts at cardioversion.  He had a previous attempt to switch him from beta blocker to diltiazem.  However he developed significant periodontal disease and swelling of his gingiva from the diltiazem.  He is now back on his beta blocker and is feeling well. Current Outpatient Prescriptions  Medication Sig Dispense Refill  . acetaminophen (TYLENOL) 500 MG tablet Take 1,000 mg by mouth every 6 (six) hours as needed for pain.       Marland Kitchen atenolol (TENORMIN) 50 MG tablet Take 1.5 tablets (75 mg total) by mouth daily.  45 tablet  11  . Probiotic Product (PROBIOTIC DAILY PO) Take 1 capsule by mouth daily.      Marland Kitchen warfarin (COUMADIN) 5 MG tablet Take 5 mg by mouth daily at 6 PM.        No current facility-administered medications for this visit.    Allergies  Allergen Reactions  . Codeine Other (See Comments)    constpation  . Diltiazem     Gingival hyperplasia    Patient Active Problem List   Diagnosis Date Noted  . Encounter for therapeutic drug monitoring 10/27/2013  . Urinary tract infection 05/27/2013  . Hypokalemia 05/27/2013  . Current use of long term anticoagulation 06/14/2011  . CERUMEN IMPACTION, RIGHT 08/11/2009  . OBESITY 01/20/2009  . HYPERTENSION, UNSPECIFIED 01/20/2009  . PALPITATIONS 01/20/2009  .  Atrial fibrillation 03/13/2008  . HYPERLIPIDEMIA 07/31/2007  . COLONIC POLYPS, HX OF 07/31/2007  . DIVERTICULITIS, HX OF 07/31/2007    History  Smoking status  . Never Smoker   Smokeless tobacco  . Never Used    History  Alcohol Use No    Family History  Problem Relation Age of Onset  . Colon cancer    . Diabetes    . Prostate cancer Brother   . Heart disease    . Heart attack Father   . Coronary artery disease      Two siblings, status post stenting to siblings with heart disease. One. Status post pacemaker insertion.    Review of Systems: Constitutional: no fever chills diaphoresis or fatigue or change in weight.  Head and neck: no hearing loss, no epistaxis, no photophobia or visual disturbance. Respiratory: No cough, shortness of breath or wheezing. Cardiovascular: No chest pain peripheral edema, palpitations. Gastrointestinal: No abdominal distention, no abdominal pain, no change in bowel habits hematochezia or melena. Genitourinary: No dysuria, no frequency, no urgency, no nocturia. Musculoskeletal:No arthralgias, no back pain, no gait disturbance or myalgias. Neurological: No dizziness, no headaches, no numbness, no seizures, no syncope, no weakness, no tremors. Hematologic: No lymphadenopathy, no easy bruising. Psychiatric: No confusion, no hallucinations, no sleep disturbance.    Physical Exam: Filed Vitals:   12/08/13 0817  BP: 118/68  Pulse: 105  the general appearance reveals a large gentleman in no distress.The head and neck exam reveals pupils equal and reactive.  Extraocular movements are full.  There is no scleral icterus.  The mouth and pharynx are normal.  The neck is supple.  The carotids reveal no bruits.  The jugular venous pressure is normal.  The  thyroid is not enlarged.  There is no lymphadenopathy.  The chest is clear to percussion and auscultation.  There are no rales or rhonchi.  Expansion of the chest is symmetrical.  The precordium is  quiet.  The pulse is irregularly irregular The first heart sound is normal.  The second heart sound is physiologically split.  There is no murmur gallop rub or click.  There is no abnormal lift or heave.  The abdomen is soft and nontender.  The bowel sounds are normal.  The liver and spleen are not enlarged.  There are no abdominal masses.  There are no abdominal bruits.  Extremities reveal good pedal pulses.  There is no phlebitis or edema.  There is no cyanosis or clubbing.  Strength is normal and symmetrical in all extremities.  There is no lateralizing weakness.  There are no sensory deficits.  The skin is warm and dry.  There is no rash.  EKG today shows atrial fibrillation with ventricular response 105.  There are nonspecific T-wave changes present.  Since last tracing 05/26/2013, heart rate has slowed   Assessment / Plan: The patient is to continue on his same medication.  Recheck in 6 months for followup office visit.

## 2013-12-22 ENCOUNTER — Other Ambulatory Visit: Payer: Self-pay | Admitting: *Deleted

## 2013-12-22 ENCOUNTER — Ambulatory Visit (INDEPENDENT_AMBULATORY_CARE_PROVIDER_SITE_OTHER): Payer: Medicare Other | Admitting: General Practice

## 2013-12-22 ENCOUNTER — Other Ambulatory Visit: Payer: Self-pay | Admitting: General Practice

## 2013-12-22 DIAGNOSIS — I4891 Unspecified atrial fibrillation: Secondary | ICD-10-CM

## 2013-12-22 DIAGNOSIS — Z5181 Encounter for therapeutic drug level monitoring: Secondary | ICD-10-CM

## 2013-12-22 LAB — POCT INR: INR: 3.1

## 2013-12-22 MED ORDER — WARFARIN SODIUM 5 MG PO TABS: ORAL_TABLET | ORAL | Status: DC

## 2013-12-22 MED ORDER — ATENOLOL 50 MG PO TABS: 75.0000 mg | ORAL_TABLET | Freq: Every day | ORAL | Status: DC

## 2013-12-22 NOTE — Progress Notes (Signed)
Pre visit review using our clinic review tool, if applicable. No additional management support is needed unless otherwise documented below in the visit note. 

## 2013-12-25 ENCOUNTER — Encounter: Payer: Self-pay | Admitting: Internal Medicine

## 2013-12-26 ENCOUNTER — Encounter: Payer: Self-pay | Admitting: Family

## 2014-01-19 ENCOUNTER — Ambulatory Visit (INDEPENDENT_AMBULATORY_CARE_PROVIDER_SITE_OTHER): Payer: Medicare Other | Admitting: General Practice

## 2014-01-19 DIAGNOSIS — I4891 Unspecified atrial fibrillation: Secondary | ICD-10-CM | POA: Diagnosis not present

## 2014-01-19 DIAGNOSIS — Z5181 Encounter for therapeutic drug level monitoring: Secondary | ICD-10-CM

## 2014-01-19 LAB — POCT INR: INR: 1.9

## 2014-01-19 NOTE — Progress Notes (Signed)
Pre visit review using our clinic review tool, if applicable. No additional management support is needed unless otherwise documented below in the visit note. 

## 2014-01-23 DIAGNOSIS — M1710 Unilateral primary osteoarthritis, unspecified knee: Secondary | ICD-10-CM | POA: Diagnosis not present

## 2014-02-16 ENCOUNTER — Ambulatory Visit (INDEPENDENT_AMBULATORY_CARE_PROVIDER_SITE_OTHER): Payer: Medicare Other | Admitting: General Practice

## 2014-02-16 DIAGNOSIS — Z5181 Encounter for therapeutic drug level monitoring: Secondary | ICD-10-CM | POA: Diagnosis not present

## 2014-02-16 DIAGNOSIS — I4891 Unspecified atrial fibrillation: Secondary | ICD-10-CM | POA: Diagnosis not present

## 2014-02-16 LAB — POCT INR: INR: 2.2

## 2014-02-16 NOTE — Progress Notes (Signed)
Pre visit review using our clinic review tool, if applicable. No additional management support is needed unless otherwise documented below in the visit note. 

## 2014-03-16 ENCOUNTER — Ambulatory Visit (INDEPENDENT_AMBULATORY_CARE_PROVIDER_SITE_OTHER): Payer: Medicare Other | Admitting: General Practice

## 2014-03-16 DIAGNOSIS — I4891 Unspecified atrial fibrillation: Secondary | ICD-10-CM

## 2014-03-16 DIAGNOSIS — Z5181 Encounter for therapeutic drug level monitoring: Secondary | ICD-10-CM

## 2014-03-16 LAB — POCT INR: INR: 1.9

## 2014-03-16 NOTE — Progress Notes (Signed)
Pre visit review using our clinic review tool, if applicable. No additional management support is needed unless otherwise documented below in the visit note. 

## 2014-04-16 ENCOUNTER — Telehealth: Payer: Self-pay | Admitting: Internal Medicine

## 2014-04-16 NOTE — Telephone Encounter (Signed)
Wife states pt has been incredibly tired lately. Another issue is he is having some memory issues. All pt talks about w/ her or anyone else is himself.  No one can talk about anything else b/c he is talking about what happened 50 yrs ago.  Wife states he will not let her come to appt w/ him, so she wanted to call and let you know. She absolutely doesn't want him to know she called either. Pt has appt at 8 am in the morning.

## 2014-04-17 ENCOUNTER — Encounter: Payer: Self-pay | Admitting: Internal Medicine

## 2014-04-17 ENCOUNTER — Ambulatory Visit (INDEPENDENT_AMBULATORY_CARE_PROVIDER_SITE_OTHER): Payer: Medicare Other | Admitting: Internal Medicine

## 2014-04-17 VITALS — BP 110/70 | HR 85 | Temp 97.9°F | Resp 20 | Ht 72.0 in | Wt 252.0 lb

## 2014-04-17 DIAGNOSIS — E785 Hyperlipidemia, unspecified: Secondary | ICD-10-CM

## 2014-04-17 DIAGNOSIS — I4891 Unspecified atrial fibrillation: Secondary | ICD-10-CM

## 2014-04-17 DIAGNOSIS — I10 Essential (primary) hypertension: Secondary | ICD-10-CM | POA: Diagnosis not present

## 2014-04-17 DIAGNOSIS — R5383 Other fatigue: Secondary | ICD-10-CM | POA: Diagnosis not present

## 2014-04-17 DIAGNOSIS — R5381 Other malaise: Secondary | ICD-10-CM | POA: Diagnosis not present

## 2014-04-17 DIAGNOSIS — Z7901 Long term (current) use of anticoagulants: Secondary | ICD-10-CM | POA: Diagnosis not present

## 2014-04-17 LAB — CBC WITH DIFFERENTIAL/PLATELET
Basophils Absolute: 0 10*3/uL (ref 0.0–0.1)
Basophils Relative: 0.3 % (ref 0.0–3.0)
Eosinophils Absolute: 0.1 10*3/uL (ref 0.0–0.7)
Eosinophils Relative: 1.3 % (ref 0.0–5.0)
HCT: 52.6 % — ABNORMAL HIGH (ref 39.0–52.0)
Hemoglobin: 17.6 g/dL — ABNORMAL HIGH (ref 13.0–17.0)
Lymphocytes Relative: 25.1 % (ref 12.0–46.0)
Lymphs Abs: 2.2 10*3/uL (ref 0.7–4.0)
MCHC: 33.5 g/dL (ref 30.0–36.0)
MCV: 97.2 fl (ref 78.0–100.0)
Monocytes Absolute: 0.6 10*3/uL (ref 0.1–1.0)
Monocytes Relative: 6.6 % (ref 3.0–12.0)
Neutro Abs: 5.9 10*3/uL (ref 1.4–7.7)
Neutrophils Relative %: 66.7 % (ref 43.0–77.0)
Platelets: 201 10*3/uL (ref 150.0–400.0)
RBC: 5.41 Mil/uL (ref 4.22–5.81)
RDW: 13.9 % (ref 11.5–15.5)
WBC: 8.9 10*3/uL (ref 4.0–10.5)

## 2014-04-17 LAB — COMPREHENSIVE METABOLIC PANEL
ALT: 18 U/L (ref 0–53)
AST: 22 U/L (ref 0–37)
Albumin: 4 g/dL (ref 3.5–5.2)
Alkaline Phosphatase: 71 U/L (ref 39–117)
BUN: 16 mg/dL (ref 6–23)
CO2: 30 mEq/L (ref 19–32)
Calcium: 9.4 mg/dL (ref 8.4–10.5)
Chloride: 104 mEq/L (ref 96–112)
Creatinine, Ser: 1.2 mg/dL (ref 0.4–1.5)
GFR: 64.37 mL/min (ref >60.00)
Glucose, Bld: 104 mg/dL — ABNORMAL HIGH (ref 70–99)
Potassium: 5.1 mEq/L (ref 3.5–5.1)
Sodium: 138 mEq/L (ref 135–145)
Total Bilirubin: 0.7 mg/dL (ref 0.2–1.2)
Total Protein: 6.9 g/dL (ref 6.0–8.3)

## 2014-04-17 LAB — SEDIMENTATION RATE: Sed Rate: 1 mm/hr (ref 0–22)

## 2014-04-17 LAB — POCT INR: INR: 1.9

## 2014-04-17 LAB — TSH: TSH: 2.14 u[IU]/mL (ref 0.35–4.50)

## 2014-04-17 NOTE — Progress Notes (Signed)
Pre visit review using our clinic review tool, if applicable. No additional management support is needed unless otherwise documented below in the visit note. 

## 2014-04-17 NOTE — Progress Notes (Signed)
   Subjective:    Patient ID: Glen [name], male    DOB: 08/25/1928, 80 y.o.   MRN: [id number]  HPI Wt Readings from Last 3 Encounters:  04/17/14 252 lb (114.306 kg)  12/08/13 248 lb (112.492 kg)  10/17/13 249 lb (112.946 kg)     Review of Systems     Objective:   Physical Exam        Assessment & Plan:

## 2014-04-17 NOTE — Telephone Encounter (Signed)
Printed and given to Dr. Raliegh Ip

## 2014-04-17 NOTE — Patient Instructions (Signed)
Limit your sodium (Salt) intake    It is important that you exercise regularly, at least 20 minutes 3 to 4 times per week.  If you develop chest pain or shortness of breath seek  medical attention.  You need to lose weight.  Consider a lower calorie diet and regular exercise.  Return in 6 months for follow-up   

## 2014-04-17 NOTE — Progress Notes (Signed)
Subjective:    Patient ID: Glen [name], male    DOB: [birth year], 80 y.o.   MRN: [id number]  HPI 80 year old patient who is seen today for followup.  He has a history of permanent atrial fibrillation and continues to do quite well on rate control and chronic anticoagulation.  He is followed by cardiology by annually.  He has no concerns or complaints.  There has been some modest weight gain since his last admission here.  He has a history of hypertension, which has been well-controlled on beta blocker therapy. His wife called the office with concerns.  These concerns include fatigue, and some memory issues. The patient does state that late afternoon.  He becomes more tired than usual. A limited MMSE revealed no abnormalities  Past Medical History  Diagnosis Date  . Atrial fibrillation   . Drug therapy     Anticoagulation Therapy  . Palpitation   . Hx of echocardiogram     a. Echo 5/11:  EF 55-60%, mild dilated ascending aorta, mod LAE  . Hypertension   . Hyperlipidemia   . Obesity   . Neoplasm of prostate     special screening malignant   . Diverticulitis   . History of colonic polyps   . FHx: colon cancer   . Family history of diabetes insipidus     History   Social History  . Marital Status: Married    Spouse Name: N/A    Number of Children: N/A  . Years of Education: N/A   Occupational History  . Retired    Social History Main Topics  . Smoking status: Never Smoker   . Smokeless tobacco: Never Used  . Alcohol Use: No  . Drug Use: Not on file  . Sexual Activity: Not on file   Other Topics Concern  . Not on file   Social History Narrative   Regular exercise: Yes: [employer] 4 times/week    Past Surgical History  Procedure Laterality Date  . Colonoscopy  December 2005  . Anal fistula repair    . Knee arthroscopy      Right  . Tonsillectomy    . Ett  1991    Negative  . Cataract extraction  2004  . Cardiac valve surgery  1996    status post balloon  [facility]ul[facility] at [facility]  . 2-d echocardiogram  November 2009, 2011    Family History  Problem Relation Age of Onset  . Colon cancer    . Diabetes    . Prostate cancer Brother   . Heart disease    . Heart attack Father   . Coronary artery disease      Two siblings, status post stenting to siblings with heart disease. One. Status post pacemaker insertion.    Allergies  Allergen Reactions  . Codeine Other (See Comments)    constpation  . Diltiazem     Gingival hyperplasia    Current Outpatient Prescriptions on File Prior to Visit  Medication Sig Dispense Refill  . acetaminophen (TYLENOL) 500 MG tablet Take 1,000 mg by mouth every 6 (six) hours as needed for pain.       Marland Kitchen atenolol (TENORMIN) 50 MG tablet Take 1.5 tablets (75 mg total) by mouth daily.  135 tablet  3  . Probiotic Product (PROBIOTIC DAILY PO) Take 1 capsule by mouth daily.      Marland Kitchen warfarin (COUMADIN) 5 MG tablet Take as directed by anticoagulation clinic  90 tablet  1   No current  facility-administered medications on file prior to visit.    BP 110/70  Pulse 85  Temp(Src) 97.9 F (36.6 C) (Oral)  Resp 20  Ht 6' (1.829 m)  Wt 252 lb (114.306 kg)  BMI 34.17 kg/m2  SpO2 97%       Review of Systems  Constitutional: Positive for fatigue. Negative for fever, chills and appetite change.  HENT: Negative for congestion, dental problem, ear pain, hearing loss, sore throat, tinnitus, trouble swallowing and voice change.   Eyes: Negative for pain, discharge and visual disturbance.  Respiratory: Negative for cough, chest tightness, wheezing and stridor.   Cardiovascular: Negative for chest pain, palpitations and leg swelling.  Gastrointestinal: Negative for nausea, vomiting, abdominal pain, diarrhea, constipation, blood in stool and abdominal distention.  Genitourinary: Negative for urgency, hematuria, flank pain, discharge, difficulty urinating and genital sores.  Musculoskeletal: Negative for arthralgias, back pain,  gait problem, joint swelling, myalgias and neck stiffness.  Skin: Negative for rash.  Neurological: Negative for dizziness, syncope, speech difficulty, weakness, numbness and headaches.  Hematological: Negative for adenopathy. Does not bruise/bleed easily.  Psychiatric/Behavioral: Negative for behavioral problems and dysphoric mood. The patient is not nervous/anxious.        Objective:   Physical Exam  Constitutional: He is oriented to person, place, and time. He appears well-developed.  HENT:  Head: Normocephalic.  Right Ear: External ear normal.  Left Ear: External ear normal.  Eyes: Conjunctivae and EOM are normal.  Neck: Normal range of motion.  Cardiovascular: Normal rate and normal heart sounds.   Irregular rhythm with controlled ventricular response  Pulmonary/Chest: Breath sounds normal.  Abdominal: Bowel sounds are normal.  Musculoskeletal: Normal range of motion. He exhibits edema. He exhibits no tenderness.  Trace edema with right ankle, more prominent on the left  Neurological: He is alert and oriented to person, place, and time.  Psychiatric: He has a normal mood and affect. His behavior is normal.          Assessment & Plan:    Permanent atrial fibrillation.  We'll continue anticoagulation and rate control Hypertension stable Fatigue.  We'll check updated lab Concerns for memory loss.  Patient seems alert and appropriate.  Today.  We'll continue to observe Obesity.  Modest weight gain.  Weight loss encouraged

## 2014-04-27 ENCOUNTER — Ambulatory Visit: Payer: Medicare Other

## 2014-05-06 ENCOUNTER — Encounter: Payer: Self-pay | Admitting: Internal Medicine

## 2014-05-18 ENCOUNTER — Ambulatory Visit: Payer: Medicare Other

## 2014-05-19 ENCOUNTER — Ambulatory Visit (INDEPENDENT_AMBULATORY_CARE_PROVIDER_SITE_OTHER): Payer: Medicare Other | Admitting: Family

## 2014-05-19 DIAGNOSIS — Z5181 Encounter for therapeutic drug level monitoring: Secondary | ICD-10-CM

## 2014-05-19 LAB — POCT INR: INR: 2.4

## 2014-05-19 NOTE — Patient Instructions (Signed)
Take 5 mgs daily except 2.5 mg on Monday.  Re-check in 6 weeks per patient request.  Anticoagulation Dose Instructions as of 05/19/2014     Dorene Grebe Tue Wed Thu Fri Sat   New Dose 5 mg 2.5 mg 5 mg 5 mg 5 mg 5 mg 5 mg    Description       Take 5 mgs daily except 2.5 mg on Monday.  Re-check in 6 weeks per patient request.

## 2014-06-09 DIAGNOSIS — M1710 Unilateral primary osteoarthritis, unspecified knee: Secondary | ICD-10-CM | POA: Diagnosis not present

## 2014-06-17 ENCOUNTER — Ambulatory Visit (INDEPENDENT_AMBULATORY_CARE_PROVIDER_SITE_OTHER): Payer: Medicare Other | Admitting: Cardiology

## 2014-06-17 ENCOUNTER — Encounter: Payer: Self-pay | Admitting: Cardiology

## 2014-06-17 VITALS — BP 110/70 | HR 110 | Ht 72.0 in | Wt 254.0 lb

## 2014-06-17 DIAGNOSIS — I4891 Unspecified atrial fibrillation: Secondary | ICD-10-CM

## 2014-06-17 DIAGNOSIS — R0609 Other forms of dyspnea: Secondary | ICD-10-CM

## 2014-06-17 DIAGNOSIS — I10 Essential (primary) hypertension: Secondary | ICD-10-CM | POA: Diagnosis not present

## 2014-06-17 DIAGNOSIS — Z7901 Long term (current) use of anticoagulants: Secondary | ICD-10-CM | POA: Diagnosis not present

## 2014-06-17 DIAGNOSIS — R0600 Dyspnea, unspecified: Secondary | ICD-10-CM

## 2014-06-17 DIAGNOSIS — I482 Chronic atrial fibrillation, unspecified: Secondary | ICD-10-CM

## 2014-06-17 DIAGNOSIS — R0989 Other specified symptoms and signs involving the circulatory and respiratory systems: Secondary | ICD-10-CM | POA: Diagnosis not present

## 2014-06-17 NOTE — Assessment & Plan Note (Signed)
The patient has not had any TIA symptoms.  No complications from the Coumadin.  Generally his INRs have been in the therapeutic range.  Tries to watch his diet carefully in that regard

## 2014-06-17 NOTE — Patient Instructions (Signed)
Your physician recommends that you continue on your current medications as directed. Please refer to the Current Medication list given to you today.  Your physician wants you to follow-up in: 6 MONTH OV /EKG You will receive a reminder letter in the mail two months in advance. If you don't receive a letter, please call our office to schedule the follow-up appointment.  

## 2014-06-17 NOTE — Assessment & Plan Note (Signed)
The patient is not having any increased shortness of breath.  However his weight is up 6 pounds since last visit.  I have encouraged him to lose weight.

## 2014-06-17 NOTE — Progress Notes (Signed)
Glen [name] Date of Birth:  Feb 28, 1928 [facility] [address] [place], [place]  [address] [phone]        Fax   [phone]   History of Present Illness:  This pleasant 80 year old gentleman is seen for a six-month followup office visit. Patient has a history of permanent atrial fibrillation. He also has a history of hypertension and hyperlipidemia. His CHADSS score equals 2 for age and hypertension. His Coumadin is followed by his PCP. The patient does not have any history of congestive heart failure. He had an echocardiogram in May 2011 showing an ejection fraction of 55-60%. His atrial fibrillation has been managed with rate control. He has never had attempts at cardioversion. He had a previous attempt to switch him from beta blocker to diltiazem. However he developed significant periodontal disease and swelling of his gingiva from the diltiazem. He is now back on his beta blocker and is feeling well.  Since last visit he reduced his atenolol from 75 mg a day to 50 mg a day and he feels better and has more energy.  Current Outpatient Prescriptions  Medication Sig Dispense Refill  . acetaminophen (TYLENOL) 500 MG tablet Take 1,000 mg by mouth every 6 (six) hours as needed for pain.       Marland Kitchen atenolol (TENORMIN) 50 MG tablet Take 50 mg by mouth daily.      . Probiotic Product (PROBIOTIC DAILY PO) Take 1 capsule by mouth daily.      Marland Kitchen warfarin (COUMADIN) 5 MG tablet Take as directed by anticoagulation clinic  90 tablet  1   No current facility-administered medications for this visit.    Allergies  Allergen Reactions  . Codeine Other (See Comments)    constpation  . Diltiazem     Gingival hyperplasia    Patient Active Problem List   Diagnosis Date Noted  . DOE (dyspnea on exertion) 12/08/2013  . Encounter for therapeutic drug monitoring 10/27/2013  . Urinary tract infection 05/27/2013  . Hypokalemia 05/27/2013  . Current use of long term  anticoagulation 06/14/2011  . CERUMEN IMPACTION, RIGHT 08/11/2009  . OBESITY 01/20/2009  . HYPERTENSION, UNSPECIFIED 01/20/2009  . PALPITATIONS 01/20/2009  . Atrial fibrillation 03/13/2008  . HYPERLIPIDEMIA 07/31/2007  . COLONIC POLYPS, HX OF 07/31/2007  . DIVERTICULITIS, HX OF 07/31/2007    History  Smoking status  . Never Smoker   Smokeless tobacco  . Never Used    History  Alcohol Use No    Family History  Problem Relation Age of Onset  . Colon cancer    . Diabetes    . Prostate cancer Brother   . Heart disease    . Heart attack Father   . Coronary artery disease      Two siblings, status post stenting to siblings with heart disease. One. Status post pacemaker insertion.  . Stroke Brother     Review of Systems: Constitutional: no fever chills diaphoresis or fatigue or change in weight.  Head and neck: no hearing loss, no epistaxis, no photophobia or visual disturbance. Respiratory: No cough, shortness of breath or wheezing. Cardiovascular: No chest pain peripheral edema, palpitations. Gastrointestinal: No abdominal distention, no abdominal pain, no change in bowel habits hematochezia or melena. Genitourinary: No dysuria, no frequency, no urgency, no nocturia. Musculoskeletal:No arthralgias, no back pain, no gait disturbance or myalgias. Neurological: No dizziness, no headaches, no numbness, no seizures, no syncope, no weakness, no tremors. Hematologic: No lymphadenopathy, no easy bruising. Psychiatric:  No confusion, no hallucinations, no sleep disturbance.    Physical Exam: Filed Vitals:   06/17/14 0925  BP: 110/70  Pulse: 110  The patient appears to be in no distress.  Moderately obese. Head and neck exam reveals that the pupils are equal and reactive.  The extraocular movements are full.  There is no scleral icterus.  Mouth and pharynx are benign.  No lymphadenopathy.  No carotid bruits.  The jugular venous pressure is normal.  Thyroid is not enlarged or  tender.  Chest is clear to percussion and auscultation.  No rales or rhonchi.  Expansion of the chest is symmetrical.  Heart reveals no abnormal lift or heave.  First and second heart sounds are normal.  The pulse is irregularly irregular.  There is no murmur gallop rub or click.  The abdomen is soft and nontender.  Bowel sounds are normoactive.  There is no hepatosplenomegaly or mass.  There are no abdominal bruits.  Extremities reveal no phlebitis or edema.  Pedal pulses are good.  There is no cyanosis or clubbing.  Neurologic exam is normal strength and no lateralizing weakness.  No sensory deficits.  Integument reveals no rash    Assessment / Plan: 1.  Chronic permanent atrial fibrillation. 2. hypertensive heart disease without heart failure 3. hypercholesterolemia followed by PCP 4.  Obesity 5.  History of diverticulosis  : Continue same medication.  Recheck in 6 months for office visit and EKG

## 2014-06-17 NOTE — Assessment & Plan Note (Signed)
Blood pressure is remaining stable on current therapy.  He is not having any dizzy spells or syncope

## 2014-06-24 ENCOUNTER — Encounter: Payer: Self-pay | Admitting: Internal Medicine

## 2014-06-29 ENCOUNTER — Ambulatory Visit (INDEPENDENT_AMBULATORY_CARE_PROVIDER_SITE_OTHER): Payer: Medicare Other | Admitting: Family

## 2014-06-29 DIAGNOSIS — Z23 Encounter for immunization: Secondary | ICD-10-CM

## 2014-06-29 DIAGNOSIS — I4891 Unspecified atrial fibrillation: Secondary | ICD-10-CM

## 2014-06-29 DIAGNOSIS — Z5181 Encounter for therapeutic drug level monitoring: Secondary | ICD-10-CM

## 2014-06-29 LAB — POCT INR: INR: 2.8

## 2014-06-29 NOTE — Patient Instructions (Signed)
Take 5 mgs daily except 2.5 mg on Monday.  Re-check in 6 weeks per patient request.  Anticoagulation Dose Instructions as of 06/29/2014     Dorene Grebe Tue Wed Thu Fri Sat   New Dose 5 mg 2.5 mg 5 mg 5 mg 5 mg 5 mg 5 mg    Description       Take 5 mgs daily except 2.5 mg on Monday.  Re-check in 6 weeks per patient request.

## 2014-08-10 ENCOUNTER — Ambulatory Visit: Payer: Medicare Other

## 2014-08-11 ENCOUNTER — Ambulatory Visit (INDEPENDENT_AMBULATORY_CARE_PROVIDER_SITE_OTHER): Payer: Medicare Other | Admitting: Family

## 2014-08-11 ENCOUNTER — Encounter: Payer: Self-pay | Admitting: Family

## 2014-08-11 DIAGNOSIS — Z5181 Encounter for therapeutic drug level monitoring: Secondary | ICD-10-CM

## 2014-08-11 LAB — POCT INR: INR: 2.7

## 2014-08-11 NOTE — Patient Instructions (Signed)
Take 5 mgs daily except 2.5 mg on Monday.  Re-check in 6 weeks per patient request.  Anticoagulation Dose Instructions as of 08/11/2014      Dorene Grebe Tue Wed Thu Fri Sat   New Dose 5 mg 2.5 mg 5 mg 5 mg 5 mg 5 mg 5 mg    Description        Take 5 mgs daily except 2.5 mg on Monday.  Re-check in 6 weeks per patient request.

## 2014-08-25 ENCOUNTER — Other Ambulatory Visit: Payer: Self-pay | Admitting: Internal Medicine

## 2014-09-21 ENCOUNTER — Ambulatory Visit (INDEPENDENT_AMBULATORY_CARE_PROVIDER_SITE_OTHER): Payer: Medicare Other | Admitting: Family

## 2014-09-21 DIAGNOSIS — Z5181 Encounter for therapeutic drug level monitoring: Secondary | ICD-10-CM

## 2014-09-21 LAB — POCT INR: INR: 3.4

## 2014-09-21 NOTE — Patient Instructions (Signed)
Hold coumadin today then Take 5 mgs daily except 2.5 mg on Monday and Wed.  Re-check in 4 weeks per patient request.   Anticoagulation Dose Instructions as of 09/21/2014      Dorene Grebe Tue Wed Thu Fri Sat   New Dose 5 mg 2.5 mg 5 mg 2.5 mg 5 mg 5 mg 5 mg    Description        Hold coumadin today then Take 5 mgs daily except 2.5 mg on Monday and Wed.  Re-check in 4 weeks per patient request.

## 2014-09-22 ENCOUNTER — Ambulatory Visit: Payer: Medicare Other | Admitting: Family

## 2014-10-19 ENCOUNTER — Encounter: Payer: Self-pay | Admitting: Internal Medicine

## 2014-10-19 ENCOUNTER — Ambulatory Visit (INDEPENDENT_AMBULATORY_CARE_PROVIDER_SITE_OTHER): Payer: Medicare Other | Admitting: Internal Medicine

## 2014-10-19 ENCOUNTER — Ambulatory Visit (INDEPENDENT_AMBULATORY_CARE_PROVIDER_SITE_OTHER): Payer: Medicare Other | Admitting: Family

## 2014-10-19 VITALS — BP 120/70 | HR 111 | Temp 97.7°F | Resp 20 | Ht 72.0 in | Wt 256.0 lb

## 2014-10-19 DIAGNOSIS — E785 Hyperlipidemia, unspecified: Secondary | ICD-10-CM | POA: Diagnosis not present

## 2014-10-19 DIAGNOSIS — I10 Essential (primary) hypertension: Secondary | ICD-10-CM | POA: Diagnosis not present

## 2014-10-19 DIAGNOSIS — I480 Paroxysmal atrial fibrillation: Secondary | ICD-10-CM | POA: Diagnosis not present

## 2014-10-19 DIAGNOSIS — E876 Hypokalemia: Secondary | ICD-10-CM

## 2014-10-19 DIAGNOSIS — Z5181 Encounter for therapeutic drug level monitoring: Secondary | ICD-10-CM | POA: Diagnosis not present

## 2014-10-19 LAB — POCT INR: INR: 2.2

## 2014-10-19 NOTE — Progress Notes (Signed)
Subjective:    Patient ID: Glen [name], male    DOB: 28-Jan-1928, 81 y.o.   MRN: [id number]  HPI  81 year old patient who is seen today for his biannual follow-up.  He continues to do well.  He was seen by cardiology approximately 4 months ago.  4.  Permanent atrial fibrillation.  He remains on chronic Coumadin anticoagulation.  Medical problems include mild dyslipidemia, essential hypertension and exogenous obesity. He does have considerable arthritis involving the right knee but is being managed with conservative therapy. No concerns or complaints.  Lab Results  Component Value Date   INR 3.4 09/21/2014   INR 2.7 08/11/2014   INR 2.8 06/29/2014    Past Medical History  Diagnosis Date  . Atrial fibrillation   . Drug therapy     Anticoagulation Therapy  . Palpitation   . Hx of echocardiogram     a. Echo 5/11:  EF 55-60%, mild dilated ascending aorta, mod LAE  . Hypertension   . Hyperlipidemia   . Obesity   . Neoplasm of prostate     special screening malignant   . Diverticulitis   . History of colonic polyps   . FHx: colon cancer   . Family history of diabetes insipidus     History   Social History  . Marital Status: Married    Spouse Name: N/A    Number of Children: N/A  . Years of Education: N/A   Occupational History  . Retired    Social History Main Topics  . Smoking status: Never Smoker   . Smokeless tobacco: Never Used  . Alcohol Use: No  . Drug Use: Not on file  . Sexual Activity: Not on file   Other Topics Concern  . Not on file   Social History Narrative   Regular exercise: Yes: [employer] 4 times/week    Past Surgical History  Procedure Laterality Date  . Colonoscopy  December 2005  . Anal fistula repair    . Knee arthroscopy      Right  . Tonsillectomy    . Ett  1991    Negative  . Cataract extraction  2004  . Cardiac valve surgery  1996    status post balloon valvuloplasty at [facility]  . 2-d echocardiogram  November 2009, 2011     Family History  Problem Relation Age of Onset  . Colon cancer    . Diabetes    . Prostate cancer Brother   . Heart disease    . Heart attack Father   . Coronary artery disease      Two siblings, status post stenting to siblings with heart disease. One. Status post pacemaker insertion.  . Stroke Brother     Allergies  Allergen Reactions  . Codeine Other (See Comments)    constpation  . Diltiazem     Gingival hyperplasia    Current Outpatient Prescriptions on File Prior to Visit  Medication Sig Dispense Refill  . acetaminophen (TYLENOL) 500 MG tablet Take 1,000 mg by mouth every 6 (six) hours as needed for pain.     Marland Kitchen atenolol (TENORMIN) 50 MG tablet Take 25 mg by mouth daily.     . Probiotic Product (PROBIOTIC DAILY PO) Take 1 capsule by mouth daily.    Marland Kitchen warfarin (COUMADIN) 5 MG tablet TAKE AS DIRECTED BY  ANTICOAGULATION  CLINIC 90 tablet 0   No current facility-administered medications on file prior to visit.    BP 120/70 mmHg  Pulse 111  Temp(Src) 97.7 F (36.5 C) (Oral)  Resp 20  Ht 6' (1.829 m)  Wt 256 lb (116.121 kg)  BMI 34.71 kg/m2  SpO2 97%    Review of Systems  Constitutional: Negative for fever, chills, appetite change and fatigue.  HENT: Negative for congestion, dental problem, ear pain, hearing loss, sore throat, tinnitus, trouble swallowing and voice change.   Eyes: Negative for pain, discharge and visual disturbance.  Respiratory: Negative for cough, chest tightness, wheezing and stridor.   Cardiovascular: Positive for leg swelling. Negative for chest pain and palpitations.  Gastrointestinal: Negative for nausea, vomiting, abdominal pain, diarrhea, constipation, blood in stool and abdominal distention.  Genitourinary: Negative for urgency, hematuria, flank pain, discharge, difficulty urinating and genital sores.  Musculoskeletal: Positive for arthralgias. Negative for myalgias, back pain, joint swelling, gait problem and neck stiffness.  Skin:  Negative for rash.  Neurological: Negative for dizziness, syncope, speech difficulty, weakness, numbness and headaches.  Hematological: Negative for adenopathy. Does not bruise/bleed easily.  Psychiatric/Behavioral: Negative for behavioral problems and dysphoric mood. The patient is not nervous/anxious.        Objective:   Physical Exam  Constitutional: He is oriented to person, place, and time. He appears well-developed.  HENT:  Head: Normocephalic.  Right Ear: External ear normal.  Left Ear: External ear normal.  Eyes: Conjunctivae and EOM are normal.  Neck: Normal range of motion.  Cardiovascular: Normal rate and normal heart sounds.   Irregular pulse rate 90  Pulmonary/Chest: Breath sounds normal.  Abdominal: Bowel sounds are normal.  Musculoskeletal: Normal range of motion. He exhibits edema. He exhibits no tenderness.  Mild lower extremity edema, right slightly greater than the left  Neurological: He is alert and oriented to person, place, and time.  Psychiatric: He has a normal mood and affect. His behavior is normal.          Assessment & Plan:    Permanent atrial fibrillation.  Will continue anticoagulation and rate control Hypertension well controlled.  Blood pressure low normal Obesity.  Weight loss encouraged Mild dyslipidemia.  Will check lipid profile in 6 months Osteoarthritis right knee.  Continue conservative therapy  CPX 6 months Medications refilled Check INR

## 2014-10-19 NOTE — Patient Instructions (Signed)
Continue to take 5 mg daily except 2.5 mg on Monday and Wed.  Re-check in 6 weeks per patient request.  Anticoagulation Dose Instructions as of 10/19/2014      Dorene Grebe Tue Wed Thu Fri Sat   New Dose 5 mg 2.5 mg 5 mg 2.5 mg 5 mg 5 mg 5 mg    Description        Continue to take 5 mg daily except 2.5 mg on Monday and Wed.  Re-check in 6 weeks per patient request.

## 2014-10-19 NOTE — Patient Instructions (Signed)
Limit your sodium (Salt) intake  You need to lose weight.  Consider a lower calorie diet and regular exercise.  Return in 6 months for follow-up

## 2014-11-10 ENCOUNTER — Encounter: Payer: Self-pay | Admitting: Internal Medicine

## 2014-11-30 ENCOUNTER — Other Ambulatory Visit: Payer: Self-pay | Admitting: General Practice

## 2014-11-30 ENCOUNTER — Ambulatory Visit: Payer: Medicare Other

## 2014-11-30 ENCOUNTER — Ambulatory Visit (INDEPENDENT_AMBULATORY_CARE_PROVIDER_SITE_OTHER): Payer: Medicare Other | Admitting: General Practice

## 2014-11-30 DIAGNOSIS — Z5181 Encounter for therapeutic drug level monitoring: Secondary | ICD-10-CM

## 2014-11-30 DIAGNOSIS — I4891 Unspecified atrial fibrillation: Secondary | ICD-10-CM | POA: Diagnosis not present

## 2014-11-30 LAB — POCT INR: INR: 2.2

## 2014-11-30 MED ORDER — WARFARIN SODIUM 5 MG PO TABS: ORAL_TABLET | ORAL | Status: DC

## 2014-11-30 NOTE — Progress Notes (Signed)
Pre visit review using our clinic review tool, if applicable. No additional management support is needed unless otherwise documented below in the visit note. 

## 2014-12-02 ENCOUNTER — Ambulatory Visit (INDEPENDENT_AMBULATORY_CARE_PROVIDER_SITE_OTHER): Payer: Medicare Other | Admitting: Cardiology

## 2014-12-02 ENCOUNTER — Encounter: Payer: Self-pay | Admitting: Cardiology

## 2014-12-02 VITALS — BP 120/86 | HR 70 | Ht 72.0 in | Wt 254.6 lb

## 2014-12-02 DIAGNOSIS — I482 Chronic atrial fibrillation, unspecified: Secondary | ICD-10-CM

## 2014-12-02 DIAGNOSIS — I10 Essential (primary) hypertension: Secondary | ICD-10-CM

## 2014-12-02 DIAGNOSIS — R0609 Other forms of dyspnea: Secondary | ICD-10-CM

## 2014-12-02 DIAGNOSIS — R0600 Dyspnea, unspecified: Secondary | ICD-10-CM

## 2014-12-02 MED ORDER — METOPROLOL TARTRATE 25 MG PO TABS: 25.0000 mg | ORAL_TABLET | Freq: Two times a day (BID) | ORAL | Status: DC

## 2014-12-02 NOTE — Progress Notes (Signed)
Cardiology Office Note   Date:  12/02/2014   ID:  Glen [name], DOB 10/09/1927, MRN [id number]  PCP:  Nyoka Cowden, MD  Cardiologist:   Darlin Coco, MD   No chief complaint on file.     History of Present Illness: Glen [name] is a 81 y.o. male who presents for follow-up office visit.  This pleasant 81 year old gentleman is seen for a six-month followup office visit. Patient has a history of permanent atrial fibrillation. He also has a history of hypertension and hyperlipidemia. His CHADSS score equals 2 for age and hypertension. His Coumadin is followed by his PCP. The patient does not have any history of congestive heart failure. He had an echocardiogram in May 2011 showing an ejection fraction of 55-60%. His atrial fibrillation has been managed with rate control. He has never had attempts at cardioversion. He had a previous attempt to switch him from beta blocker to diltiazem. However he developed significant periodontal disease and swelling of his gingiva from the diltiazem. He is now back on his beta blocker and is feeling well. On his own he has cut back on his atenolol because he felt that it was causing him to have indigestion.  Rate today is not adequately controlled.  His resting heart rate is 127 bpm.  Past Medical History  Diagnosis Date  . Atrial fibrillation   . Drug therapy     Anticoagulation Therapy  . Palpitation   . Hx of echocardiogram     a. Echo 5/11:  EF 55-60%, mild dilated ascending aorta, mod LAE  . Hypertension   . Hyperlipidemia   . Obesity   . Neoplasm of prostate     special screening malignant   . Diverticulitis   . History of colonic polyps   . FHx: colon cancer   . Family history of diabetes insipidus     Past Surgical History  Procedure Laterality Date  . Colonoscopy  December 2005  . Anal fistula repair    . Knee arthroscopy      Right  . Tonsillectomy    . Ett  1991    Negative  . Cataract extraction  2004  .  Cardiac valve surgery  1996    status post balloon val[facility]uloplasty at [facility]  . 2-d echocardiogram  November 2009, 2011     Current Outpatient Prescriptions  Medication Sig Dispense Refill  . acetaminophen (TYLENOL) 500 MG tablet Take 1,000 mg by mouth every 6 (six) hours as needed for pain.     . Probiotic Product (PROBIOTIC DAILY PO) Take 1 capsule by mouth daily.    Marland Kitchen warfarin (COUMADIN) 5 MG tablet Take as directed by anticoagulation clinic (Patient taking differently: Take 5 mg by mouth. Take 2.5 mg on Mondays and Wednesdays and 5 mg on all other days.) 90 tablet 1  . metoprolol tartrate (LOPRESSOR) 25 MG tablet Take 1 tablet (25 mg total) by mouth 2 (two) times daily. 180 tablet 3   No current facility-administered medications for this visit.    Allergies:   Codeine and Diltiazem    Social History:  The patient  reports that he has never smoked. He has never used smokeless tobacco. He reports that he does not drink alcohol.   Family History:  The patient's family history includes Colon cancer in an other family member; Coronary artery disease in an other family member; Diabetes in an other family member; Heart attack in his father; Heart disease in an other family member;  Prostate cancer in his brother; Stroke in his brother.    ROS:  Please see the history of present illness.   Otherwise, review of systems are positive for arthritis of his right knee for which Dr. Wynelle Link gives him cortisone shots periodically..   All other systems are reviewed and negative.    PHYSICAL EXAM: VS:  BP 120/86 mmHg  Pulse 70  Ht 6' (1.829 m)  Wt 254 lb 9.6 oz (115.486 kg)  BMI 34.52 kg/m2 , BMI Body mass index is 34.52 kg/(m^2). GEN: Well nourished, well developed, in no acute distress HEENT: normal Neck: no JVD, carotid bruits, or masses Cardiac: Rapid and irregularly irregular. no murmurs, rubs, or gallops,no edema  Respiratory:  clear to auscultation bilaterally, normal work of breathing GI:  soft, nontender, nondistended, + BS MS: no deformity or atrophy Skin: warm and dry, no rash Neuro:  Strength and sensation are intact Psych: euthymic mood, full affect   EKG:  EKG is ordered today. The ekg ordered today demonstrates atrial fibrillation with rapid ventricular response.  Nonspecific ST and T-wave changes.   Recent Labs: 04/17/2014: ALT 18; BUN 16; Creatinine 1.2; Hemoglobin 17.6*; Platelets 201.0; Potassium 5.1; Sodium 138; TSH 2.14    Lipid Panel    Component Value Date/Time   CHOL 181 04/15/2012 0937   TRIG 131.0 04/15/2012 0937   TRIG 150* 09/06/2006 1031   HDL 39.70 04/15/2012 0937   CHOLHDL 5 04/15/2012 0937   CHOLHDL 5.3 CALC 09/06/2006 1031   VLDL 26.2 04/15/2012 0937   LDLCALC 115* 04/15/2012 0937      Wt Readings from Last 3 Encounters:  12/02/14 254 lb 9.6 oz (115.486 kg)  10/19/14 256 lb (116.121 kg)  06/17/14 254 lb (115.214 kg)        ASSESSMENT AND PLAN: 1. Chronic permanent atrial fibrillation.  Inadequate heart rate control.  The patient is on long-term warfarin 2. hypertensive heart disease without heart failure 3. hypercholesterolemia followed by PCP 4. Obesity 5. History of diverticulosis 6.  History of frequent belching and indigestion which he attributes to atenolol    Current medicines are reviewed at length with the patient today.  The patient does not have concerns regarding medicines.  The following changes have been made:  Stop atenolol.  Start metoprolol tartrate 25 mg twice a day    Orders Placed This Encounter  Procedures  . EKG 12-Lead  . 2D Echocardiogram without contrast     Disposition:   FU with Dr. Mare Ferrari in 6 months office visit and EKG   Signed, Darlin Coco, MD  12/02/2014 5:26 PM    [facility] [address], [place], [place]  [address] Phone: [phone]; Fax: [phone]

## 2014-12-02 NOTE — Patient Instructions (Signed)
STOP ATENOLOL   START METOPROLOL 25 MG TWICE A DAY  Your physician has requested that you have an echocardiogram. Echocardiography is a painless test that uses sound waves to create images of your heart. It provides your doctor with information about the size and shape of your heart and how well your heart's chambers and valves are working. This procedure takes approximately one hour. There are no restrictions for this procedure. IN 2 WEEKS  Your physician wants you to follow-up in: [place] will receive a reminder letter in the mail two months in advance. If you don't receive a letter, please call our office to schedule the follow-up appointment.

## 2014-12-16 ENCOUNTER — Ambulatory Visit (HOSPITAL_COMMUNITY): Payer: Medicare Other | Attending: Cardiology | Admitting: Radiology

## 2014-12-16 DIAGNOSIS — I4891 Unspecified atrial fibrillation: Secondary | ICD-10-CM | POA: Insufficient documentation

## 2014-12-16 DIAGNOSIS — R0609 Other forms of dyspnea: Secondary | ICD-10-CM | POA: Insufficient documentation

## 2014-12-16 DIAGNOSIS — R0600 Dyspnea, unspecified: Secondary | ICD-10-CM

## 2014-12-16 MED ORDER — PERFLUTREN LIPID MICROSPHERE
3.0000 mL | Freq: Once | INTRAVENOUS | Status: AC
  Administered 2014-12-16: 3 mL via INTRAVENOUS

## 2014-12-16 NOTE — Progress Notes (Signed)
Echocardiogram performed with [facility].  

## 2014-12-17 ENCOUNTER — Encounter: Payer: Self-pay | Admitting: Cardiology

## 2015-01-11 ENCOUNTER — Ambulatory Visit (INDEPENDENT_AMBULATORY_CARE_PROVIDER_SITE_OTHER): Payer: Medicare Other | Admitting: General Practice

## 2015-01-11 DIAGNOSIS — Z5181 Encounter for therapeutic drug level monitoring: Secondary | ICD-10-CM | POA: Diagnosis not present

## 2015-01-11 LAB — POCT INR: INR: 2

## 2015-01-11 NOTE — Progress Notes (Signed)
Pre visit review using our clinic review tool, if applicable. No additional management support is needed unless otherwise documented below in the visit note. 

## 2015-01-27 DIAGNOSIS — M1711 Unilateral primary osteoarthritis, right knee: Secondary | ICD-10-CM | POA: Diagnosis not present

## 2015-02-22 ENCOUNTER — Ambulatory Visit (INDEPENDENT_AMBULATORY_CARE_PROVIDER_SITE_OTHER): Payer: Medicare Other | Admitting: General Practice

## 2015-02-22 DIAGNOSIS — Z5181 Encounter for therapeutic drug level monitoring: Secondary | ICD-10-CM

## 2015-02-22 LAB — POCT INR: INR: 2.1

## 2015-02-22 NOTE — Progress Notes (Signed)
Pre visit review using our clinic review tool, if applicable. No additional management support is needed unless otherwise documented below in the visit note. 

## 2015-04-12 ENCOUNTER — Ambulatory Visit (INDEPENDENT_AMBULATORY_CARE_PROVIDER_SITE_OTHER): Payer: Medicare Other | Admitting: General Practice

## 2015-04-12 DIAGNOSIS — Z5181 Encounter for therapeutic drug level monitoring: Secondary | ICD-10-CM | POA: Diagnosis not present

## 2015-04-12 LAB — POCT INR: INR: 2.5

## 2015-04-12 NOTE — Progress Notes (Signed)
Pre visit review using our clinic review tool, if applicable. No additional management support is needed unless otherwise documented below in the visit note. 

## 2015-04-19 ENCOUNTER — Ambulatory Visit (INDEPENDENT_AMBULATORY_CARE_PROVIDER_SITE_OTHER): Payer: Medicare Other | Admitting: Internal Medicine

## 2015-04-19 ENCOUNTER — Encounter: Payer: Self-pay | Admitting: Internal Medicine

## 2015-04-19 VITALS — BP 110/70 | HR 73 | Temp 97.8°F | Resp 20 | Ht 71.5 in | Wt 250.0 lb

## 2015-04-19 DIAGNOSIS — E785 Hyperlipidemia, unspecified: Secondary | ICD-10-CM

## 2015-04-19 DIAGNOSIS — R7302 Impaired glucose tolerance (oral): Secondary | ICD-10-CM | POA: Diagnosis not present

## 2015-04-19 DIAGNOSIS — I481 Persistent atrial fibrillation: Secondary | ICD-10-CM

## 2015-04-19 DIAGNOSIS — Z0000 Encounter for general adult medical examination without abnormal findings: Secondary | ICD-10-CM

## 2015-04-19 DIAGNOSIS — I10 Essential (primary) hypertension: Secondary | ICD-10-CM

## 2015-04-19 DIAGNOSIS — Z86010 Personal history of colonic polyps: Secondary | ICD-10-CM

## 2015-04-19 DIAGNOSIS — E669 Obesity, unspecified: Secondary | ICD-10-CM | POA: Insufficient documentation

## 2015-04-19 DIAGNOSIS — I4819 Other persistent atrial fibrillation: Secondary | ICD-10-CM

## 2015-04-19 LAB — CBC WITH DIFFERENTIAL/PLATELET
Basophils Absolute: 0 10*3/uL (ref 0.0–0.1)
Basophils Relative: 0.4 % (ref 0.0–3.0)
Eosinophils Absolute: 0.1 10*3/uL (ref 0.0–0.7)
Eosinophils Relative: 1.3 % (ref 0.0–5.0)
HCT: 50.8 % (ref 39.0–52.0)
Hemoglobin: 17.2 g/dL — ABNORMAL HIGH (ref 13.0–17.0)
Lymphocytes Relative: 24.4 % (ref 12.0–46.0)
Lymphs Abs: 2 10*3/uL (ref 0.7–4.0)
MCHC: 33.9 g/dL (ref 30.0–36.0)
MCV: 95.4 fl (ref 78.0–100.0)
Monocytes Absolute: 0.6 10*3/uL (ref 0.1–1.0)
Monocytes Relative: 7 % (ref 3.0–12.0)
Neutro Abs: 5.6 10*3/uL (ref 1.4–7.7)
Neutrophils Relative %: 66.9 % (ref 43.0–77.0)
Platelets: 197 10*3/uL (ref 150.0–400.0)
RBC: 5.32 Mil/uL (ref 4.22–5.81)
RDW: 14 % (ref 11.5–15.5)
WBC: 8.4 10*3/uL (ref 4.0–10.5)

## 2015-04-19 LAB — COMPREHENSIVE METABOLIC PANEL
ALT: 18 U/L (ref 0–53)
AST: 19 U/L (ref 0–37)
Albumin: 4 g/dL (ref 3.5–5.2)
Alkaline Phosphatase: 73 U/L (ref 39–117)
BUN: 16 mg/dL (ref 6–23)
CO2: 28 mEq/L (ref 19–32)
Calcium: 9.1 mg/dL (ref 8.4–10.5)
Chloride: 103 mEq/L (ref 96–112)
Creatinine, Ser: 1 mg/dL (ref 0.40–1.50)
GFR: 75.45 mL/min (ref >60.00)
Glucose, Bld: 87 mg/dL (ref 70–99)
Potassium: 4.2 mEq/L (ref 3.5–5.1)
Sodium: 140 mEq/L (ref 135–145)
Total Bilirubin: 0.7 mg/dL (ref 0.2–1.2)
Total Protein: 6.8 g/dL (ref 6.0–8.3)

## 2015-04-19 LAB — LIPID PANEL
Cholesterol: 196 mg/dL (ref 0–200)
HDL: 41.1 mg/dL (ref >39.00)
NonHDL: 154.9
Total CHOL/HDL Ratio: 5
Triglycerides: 243 mg/dL — ABNORMAL HIGH (ref 0.0–149.0)
VLDL: 48.6 mg/dL — ABNORMAL HIGH (ref 0.0–40.0)

## 2015-04-19 LAB — TSH: TSH: 2.72 u[IU]/mL (ref 0.35–4.50)

## 2015-04-19 LAB — LDL CHOLESTEROL, DIRECT: Direct LDL: 126 mg/dL

## 2015-04-19 NOTE — Patient Instructions (Signed)
Limit your sodium (Salt) intake  You need to lose weight.  Consider a lower calorie diet and regular exercise.    It is important that you exercise regularly, at least 20 minutes 3 to 4 times per week.  If you develop chest pain or shortness of breath seek  medical attention.  Cardiology follow-up as scheduled  Health Maintenance A healthy lifestyle and preventative care can promote health and wellness.  Maintain regular health, dental, and eye exams.  Eat a healthy diet. Foods like vegetables, fruits, whole grains, low-fat dairy products, and lean protein foods contain the nutrients you need and are low in calories. Decrease your intake of foods high in solid fats, added sugars, and salt. Get information about a proper diet from your health care provider, if necessary.  Regular physical exercise is one of the most important things you can do for your health. Most adults should get at least 150 minutes of moderate-intensity exercise (any activity that increases your heart rate and causes you to sweat) each week. In addition, most adults need muscle-strengthening exercises on 2 or more days a week.   Maintain a healthy weight. The body mass index (BMI) is a screening tool to identify possible weight problems. It provides an estimate of body fat based on height and weight. Your health care provider can find your BMI and can help you achieve or maintain a healthy weight. For males 20 years and older:  A BMI below 18.5 is considered underweight.  A BMI of 18.5 to 24.9 is normal.  A BMI of 25 to 29.9 is considered overweight.  A BMI of 30 and above is considered obese.  Maintain normal blood lipids and cholesterol by exercising and minimizing your intake of saturated fat. Eat a balanced diet with plenty of fruits and vegetables. Blood tests for lipids and cholesterol should begin at age 51 and be repeated every 5 years. If your lipid or cholesterol levels are high, you are over age 77, or you  are at high risk for heart disease, you may need your cholesterol levels checked more frequently.Ongoing high lipid and cholesterol levels should be treated with medicines if diet and exercise are not working.  If you smoke, find out from your health care provider how to quit. If you do not use tobacco, do not start.  Lung cancer screening is recommended for adults aged 54-80 years who are at high risk for developing lung cancer because of a history of smoking. A yearly low-dose CT scan of the lungs is recommended for people who have at least a 30-pack-year history of smoking and are current smokers or have quit within the past 15 years. A pack year of smoking is smoking an average of 1 pack of cigarettes a day for 1 year (for example, a 30-pack-year history of smoking could mean smoking 1 pack a day for 30 years or 2 packs a day for 15 years). Yearly screening should continue until the smoker has stopped smoking for at least 15 years. Yearly screening should be stopped for people who develop a health problem that would prevent them from having lung cancer treatment.  If you choose to drink alcohol, do not have more than 2 drinks per day. One drink is considered to be 12 oz (360 mL) of beer, 5 oz (150 mL) of wine, or 1.5 oz (45 mL) of liquor.  Avoid the use of street drugs. Do not share needles with anyone. Ask for help if you need support or  instructions about stopping the use of drugs.  High blood pressure causes heart disease and increases the risk of stroke. Blood pressure should be checked at least every 1-2 years. Ongoing high blood pressure should be treated with medicines if weight loss and exercise are not effective.  If you are 34-80 years old, ask your health care provider if you should take aspirin to prevent heart disease.  Diabetes screening involves taking a blood sample to check your fasting blood sugar level. This should be done once every 3 years after age 47 if you are at a normal  weight and without risk factors for diabetes. Testing should be considered at a younger age or be carried out more frequently if you are overweight and have at least 1 risk factor for diabetes.  Colorectal cancer can be detected and often prevented. Most routine colorectal cancer screening begins at the age of 51 and continues through age 20. However, your health care provider may recommend screening at an earlier age if you have risk factors for colon cancer. On a yearly basis, your health care provider may provide home test kits to check for hidden blood in the stool. A small camera at the end of a tube may be used to directly examine the colon (sigmoidoscopy or colonoscopy) to detect the earliest forms of colorectal cancer. Talk to your health care provider about this at age 61 when routine screening begins. A direct exam of the colon should be repeated every 5-10 years through age 4, unless early forms of precancerous polyps or small growths are found.  People who are at an increased risk for hepatitis B should be screened for this virus. You are considered at high risk for hepatitis B if:  You were born in a country where hepatitis B occurs often. Talk with your health care provider about which countries are considered high risk.  Your parents were born in a high-risk country and you have not received a shot to protect against hepatitis B (hepatitis B vaccine).  You have HIV or AIDS.  You use needles to inject street drugs.  You live with, or have sex with, someone who has hepatitis B.  You are a man who has sex with other men (MSM).  You get hemodialysis treatment.  You take certain medicines for conditions like cancer, organ transplantation, and autoimmune conditions.  Hepatitis C blood testing is recommended for all people born from [birth year] through 1963 and any individual with known risk factors for hepatitis C.  Healthy men should no longer receive prostate-specific antigen (PSA) blood  tests as part of routine cancer screening. Talk to your health care provider about prostate cancer screening.  Testicular cancer screening is not recommended for adolescents or adult males who have no symptoms. Screening includes self-exam, a health care provider exam, and other screening tests. Consult with your health care provider about any symptoms you have or any concerns you have about testicular cancer.  Practice safe sex. Use condoms and avoid high-risk sexual practices to reduce the spread of sexually transmitted infections (STIs).  You should be screened for STIs, including gonorrhea and chlamydia if:  You are sexually active and are younger than 24 years.  You are older than 24 years, and your health care provider tells you that you are at risk for this type of infection.  Your sexual activity has changed since you were last screened, and you are at an increased risk for chlamydia or gonorrhea. Ask your health care provider if  you are at risk.  If you are at risk of being infected with HIV, it is recommended that you take a prescription medicine daily to prevent HIV infection. This is called pre-exposure prophylaxis (PrEP). You are considered at risk if:  You are a man who has sex with other men (MSM).  You are a heterosexual man who is sexually active with multiple partners.  You take drugs by injection.  You are sexually active with a partner who has HIV.  Talk with your health care provider about whether you are at high risk of being infected with HIV. If you choose to begin PrEP, you should first be tested for HIV. You should then be tested every 3 months for as long as you are taking PrEP.  Use sunscreen. Apply sunscreen liberally and repeatedly throughout the day. You should seek shade when your shadow is shorter than you. Protect yourself by wearing long sleeves, pants, a wide-brimmed hat, and sunglasses year round whenever you are outdoors.  Tell your health care  provider of new moles or changes in moles, especially if there is a change in shape or color. Also, tell your health care provider if a mole is larger than the size of a pencil eraser.  A one-time screening for abdominal aortic aneurysm (AAA) and surgical repair of large AAAs by ultrasound is recommended for men aged 10-75 years who are current or former smokers.  Stay current with your vaccines (immunizations). Document Released: 03/16/2008 Document Revised: 09/23/2013 Document Reviewed: 02/13/2011 [facility] Patient Information 2015 [place], [place]. This information is not intended to replace advice given to you by your health care provider. Make sure you discuss any questions you have with your health care provider.

## 2015-04-19 NOTE — Progress Notes (Signed)
Subjective:    Patient ID: Glen [name], male    DOB: 1928/05/05, 81 y.o.   MRN: [id number]  HPI   Wt Readings from Last 3 Encounters:  04/19/15 250 lb (113.399 kg)  12/02/14 254 lb 9.6 oz (115.486 kg)  10/19/14 256 lb (116.121 kg)    Subjective:    Patient ID: Glen [name], male    DOB: [birth year], 81 y.o.   MRN: [id number]  HPI  CC: cpx - donig well.  History of Present Illness:   81  -year-old patient who is seen today for a comprehensive evaluation.  He is followed closely cardiology due to chronic atrial fibrillation. He is on warfarin anticoagulation. He has a history of hypertension, mild, dyslipidemia, and exogenous obesity. He has a history of colonic polyps and a family history of colon cancer. His cardiopulmonary status has been stable.  Wt Readings from Last 3 Encounters:  04/19/15 250 lb (113.399 kg)  12/02/14 254 lb 9.6 oz (115.486 kg)  10/19/14 256 lb (116.121 kg)   Here for Medicare AWV:   1. Risk factors based on Past M, S, F history: risk factors include a family history of prostate and colon cancer.  2. Physical Activities: fairly sedentary, but no exercise restrictions  3. Depression/mood: no history of depression or mood disorder  4. Hearing: no hearing deficits  5. ADL's: completely independent in all aspects of daily living  6. Fall Risk: low  7. Home Safety: no problems identified  8. Height, weight, &visual acuity: No change in visual acuity.  Last eye examination was about one year ago  9. Counseling: heart healthy diet calorie restriction and more exercise. All encouraged  10. Labs ordered based on risk factors: laboratory profile, including lipid panel will be reviewed. PSA test in discuss and it was elected to defer  11. Referral Coordination-  follow up cardiology 12. Care Plan- heart healthy diet restricted salt and weight loss. All encouraged  13. Cognitive Assessment- alert and oriented, with normal affect. Several family members have  early dementia  64.  Preventive services will include annual health examinations with screening lab.  No further screening colonoscopies.  Appropriate.  Stool hematest negative.  Today will continue biannual cardiology follow-up. Patient was provided with a written and personalized care plan 15.  Provider list includes primary care cardiology and ophthalmology  Preventive Screening-Counseling & Management  Alcohol-Tobacco  Smoking Status: never   Allergies:  1) ! Codeine  Past History:   Past Medical History:  ATRIAL FIBRILLATION (ICD-427.31)  ANTICOAGULATION THERAPY (ICD-V58.61)  PALPITATIONS (ICD-785.1)  LEFT VENTRICULAR FUNCTION, DECREASED (ICD-429.2)  HYPERTENSION, UNSPECIFIED (ICD-401.9)  HYPERLIPIDEMIA (ICD-272.4)  OBESITY (ICD-278.00)  ENCOUNTER FOR THERAPEUTIC DRUG MONITORING (ICD-V58.83)  SPECIAL SCREENING MALIGNANT NEOPLASM OF PROSTATE (ICD-V76.44)  FAMILY HISTORY DIABETES 1ST DEGREE RELATIVE (ICD-V18.0)  FAMILY HISTORY OF COLON CA 1ST DEGREE RELATIVE <60 (ICD-V16.0)  DIVERTICULITIS, HX OF (ICD-V12.79)  COLONIC POLYPS, HX OF (ICD-V12.72)  COLONIC POLYPS, HX OF (ICD-V12.72)   Past Surgical History:  Colonoscopy December 2005  Anal Fistula Repair  Arthroscopic R Knee  Tonsillectomy  negative ETT 1991  Cataract extraction 2004  status post balloon valvuloplasty at [facility] in 1996  2-D echocardiogram November 2009, 2011   Family History:  Reviewed history from 03/13/2008 and no changes required.  Family History of Colon CA 1st degree relative <60  Family History Diabetes 1st degree relative  Family History of Prostate CA 1st degree relative <50  Family History of Cardiovascular disorder  father died age 79 of  a myocardial infarction  mother died age 76, colon cancer  Two brothers 66 sisters  Positive for coronary disease. Two siblings, status post stenting to siblings with heart disease. One. Status post pacemaker insertion. One brother with prostate cancer, status  post RT   Social History:  Reviewed history from 03/17/2009 and no changes required.  Retired  Never Smoked  Alcohol use-no  Married  Regular exercise-yes: [employer] 4 times/week   Wt Readings from Last 3 Encounters:  04/14/11 245 lb (111.131 kg)  10/10/10 240 lb (108.863 kg)  04/11/10 240 lb (108.863 kg)    Past Medical History  Diagnosis Date  . Arrhythmia   . Drug therapy     Anticoagulation Therapy  . Palpitation   . Decreased left ventricular function   . Hypertension   . Hyperlipidemia   . Obesity   . Neoplasm of prostate     special screening malignant   . Diverticulitis   . History of colonic polyps   . FHx: colon cancer   . Family history of diabetes insipidus        Review of Systems  Constitutional: Negative for fever, chills, activity change, appetite change and fatigue.  HENT: Negative for hearing loss, ear pain, congestion, rhinorrhea, sneezing, mouth sores, trouble swallowing, neck pain, neck stiffness, dental problem, voice change, sinus pressure and tinnitus.   Eyes: Negative for photophobia, pain, redness and visual disturbance.  Respiratory: Negative for apnea, cough, choking, chest tightness, shortness of breath and wheezing.   Cardiovascular: Negative for chest pain, palpitations and leg swelling.  Gastrointestinal: Negative for nausea, vomiting, abdominal pain, diarrhea, constipation, blood in stool, abdominal distention, anal bleeding and rectal pain.  Genitourinary: Negative for dysuria, urgency, frequency, hematuria, flank pain, decreased urine volume, discharge, penile swelling, scrotal swelling, difficulty urinating, genital sores and testicular pain.  Musculoskeletal: Negative for myalgias, back pain, joint swelling, arthralgias and gait problem.  Skin: Negative for color change, rash and wound.  Neurological: Negative for dizziness, tremors, seizures, syncope, facial asymmetry, speech difficulty, weakness, light-headedness, numbness and  headaches.  Hematological: Negative for adenopathy. Does not bruise/bleed easily.  Psychiatric/Behavioral: Negative for suicidal ideas, hallucinations, behavioral problems, confusion, sleep disturbance, self-injury, dysphoric mood, decreased concentration and agitation. The patient is not nervous/anxious.        Objective:   Physical Exam  Constitutional: He appears well-developed and well-nourished.  HENT:  Head: Normocephalic and atraumatic.  Right Ear: External ear normal.  Left Ear: External ear normal.  Nose: Nose normal.  Mouth/Throat: Oropharynx is clear and moist.  Eyes: Conjunctivae and EOM are normal. Pupils are equal, round, and reactive to light. No scleral icterus.  Neck: Normal range of motion. Neck supple. No JVD present. No thyromegaly present.  Cardiovascular: Normal rate, normal heart sounds and intact distal pulses.  Exam reveals no gallop and no friction rub.   No murmur heard.      Irregular rhythm with a controlled ventricular response  Pulmonary/Chest: Effort normal and breath sounds normal. He exhibits no tenderness.  Abdominal: Soft. Bowel sounds are normal. He exhibits no distension and no mass. There is no tenderness.  Genitourinary: Rectum normal, prostate normal and penis normal. Guaiac negative stool. No penile tenderness.  Musculoskeletal: Normal range of motion. He exhibits edema. He exhibits no tenderness.       +1 lower extremity edema.  Lymphadenopathy:    He has no cervical adenopathy.  Neurological: He is alert. He has normal reflexes. No cranial nerve deficit. Coordination normal.  Skin: Skin is  warm and dry. No rash noted.  Psychiatric: He has a normal mood and affect. His behavior is normal.          Assessment & Plan:   Annual health assessment Hypertension well controlled Chronic atrial fibrillation Chronic Coumadin anticoagulation Exogenous obesity Impaired glucose tolerance  Laboratory data including prothrombin time will be  reviewed Followup cardiology  Return here 6 months    Review of Systems     see above Objective:   Physical Exam  Cardiovascular:  Dorsalis pedis pulses intact. Posterior tibial pulses not easily palpable  Genitourinary: Guaiac negative stool.  Neurological:  Decreased vibratory sensation and monofilament testing of the feet  Skin:  Onychomycotic nail changes          Assessment & Plan:   Preventive health examination Hypertension well controlled Atrial fibrillation. Improved rate control (80-90) on  75 mg of atenolol  We'll continue present regimen Cardiology followup Recheck 6 months

## 2015-04-19 NOTE — Progress Notes (Signed)
Pre visit review using our clinic review tool, if applicable. No additional management support is needed unless otherwise documented below in the visit note. 

## 2015-05-24 ENCOUNTER — Ambulatory Visit (INDEPENDENT_AMBULATORY_CARE_PROVIDER_SITE_OTHER): Payer: Medicare Other | Admitting: General Practice

## 2015-05-24 DIAGNOSIS — Z5181 Encounter for therapeutic drug level monitoring: Secondary | ICD-10-CM

## 2015-05-24 LAB — POCT INR: INR: 2.2

## 2015-05-24 NOTE — Progress Notes (Signed)
Pre visit review using our clinic review tool, if applicable. No additional management support is needed unless otherwise documented below in the visit note. 

## 2015-05-26 ENCOUNTER — Other Ambulatory Visit: Payer: Self-pay | Admitting: Internal Medicine

## 2015-05-27 ENCOUNTER — Other Ambulatory Visit: Payer: Self-pay | Admitting: General Practice

## 2015-05-27 MED ORDER — WARFARIN SODIUM 5 MG PO TABS: ORAL_TABLET | ORAL | Status: DC

## 2015-05-31 ENCOUNTER — Encounter: Payer: Self-pay | Admitting: Cardiology

## 2015-05-31 ENCOUNTER — Ambulatory Visit (INDEPENDENT_AMBULATORY_CARE_PROVIDER_SITE_OTHER): Payer: Medicare Other | Admitting: Cardiology

## 2015-05-31 VITALS — BP 122/80 | HR 123 | Ht 72.0 in | Wt 249.0 lb

## 2015-05-31 DIAGNOSIS — I4891 Unspecified atrial fibrillation: Secondary | ICD-10-CM | POA: Diagnosis not present

## 2015-05-31 MED ORDER — METOPROLOL TARTRATE 50 MG PO TABS: 50.0000 mg | ORAL_TABLET | Freq: Two times a day (BID) | ORAL | Status: DC

## 2015-05-31 NOTE — Patient Instructions (Signed)
Medication Instructions:  INCREASE YOUR METOPROLOL TO 50 MG TWICE A DAY  Labwork: NONE  Testing/Procedures: NONE  Follow-Up: Your physician wants you to follow-up in: [place] will receive a reminder letter in the mail two months in advance. If you don't receive a letter, please call our office to schedule the follow-up appointment.

## 2015-05-31 NOTE — Progress Notes (Signed)
Cardiology Office Note   Date:  05/31/2015   ID:  Glen [name], DOB [birth year], MRN [id number]  PCP:  Nyoka Cowden, MD  Cardiologist: Darlin Coco MD  No chief complaint on file.     History of Present Illness: Glen [name] is a 81 y.o. male who presents for a six-month follow-up office visit  Patient has a history of permanent atrial fibrillation. He also has a history of hypertension and hyperlipidemia. His CHADSS score equals 2 for age and hypertension. His Coumadin is followed by his PCP. The patient does not have any history of congestive heart failure. He had an echocardiogram in May 2011 showing an ejection fraction of 55-60%. His atrial fibrillation has been managed with rate control. He has never had attempts at cardioversion. He had a previous attempt to switch him from beta blocker to diltiazem. However he developed significant periodontal disease and swelling of his gingiva from the diltiazem. He is now back on his beta blocker and is feeling well. He initially was on atenolol which did not adequately control his rate.  At his last visit he was switched to metoprolol tartrate 25 mg twice a day.  His pulse rate is still fast.  He does have some exertional dyspnea.  He sleeps on one pillow.  He cannot do much walking because of problems with his right knee.  Dr. Wynelle Link is his orthopedist and has been giving him injections into the knee.   Past Medical History  Diagnosis Date  . Atrial fibrillation   . Drug therapy     Anticoagulation Therapy  . Palpitation   . Hx of echocardiogram     a. Echo 5/11:  EF 55-60%, mild dilated ascending aorta, mod LAE  . Hypertension   . Hyperlipidemia   . Obesity   . Neoplasm of prostate     special screening malignant   . Diverticulitis   . History of colonic polyps   . FHx: colon cancer   . Family history of diabetes insipidus     Past Surgical History  Procedure Laterality Date  . Colonoscopy  December 2005    . Anal fistula repair    . Knee arthroscopy      Right  . Tonsillectomy    . Ett  1991    Negative  . Cataract extraction  2004  . Cardiac valve surgery  1996    status post balloon [facility]  . 2-d echocardiogram  November 2009, 2011     Current Outpatient Prescriptions  Medication Sig Dispense Refill  . acetaminophen (TYLENOL) 500 MG tablet Take 1,000 mg by mouth every 6 (six) hours as needed for pain.     . metoprolol tartrate (LOPRESSOR) 50 MG tablet Take 1 tablet (50 mg total) by mouth 2 (two) times daily. 60 tablet 11  . Probiotic Product (PROBIOTIC DAILY PO) Take 1 capsule by mouth daily as needed.     . warfarin (COUMADIN) 5 MG tablet Take as directed by anticoagulation clinic 90 tablet 3   No current facility-administered medications for this visit.    Allergies:   Codeine and Diltiazem    Social History:  The patient  reports that he has never smoked. He has never used smokeless tobacco. He reports that he does not drink alcohol.   Family History:  The patient's family history includes Colon cancer in an other family member; Coronary artery disease in an other family member; Diabetes in an other family member; Heart attack  in his father; Heart disease in an other family member; Prostate cancer in his brother; Stroke in his brother.    ROS:  Please see the history of present illness.   Otherwise, review of systems are positive for none.   All other systems are reviewed and negative.    PHYSICAL EXAM: VS:  BP 122/80 mmHg  Pulse 123  Ht 6' (1.829 m)  Wt 249 lb (112.946 kg)  BMI 33.76 kg/m2 , BMI Body mass index is 33.76 kg/(m^2). GEN: Well nourished, well developed, in no acute distress HEENT: normal Neck: no JVD, carotid bruits, or masses Cardiac: Irregularly irregular. no murmurs, rubs, or gallops, trace pretibial edema  Respiratory:  clear to auscultation bilaterally, normal work of breathing GI: soft, nontender, nondistended, + BS MS: no  deformity or atrophy Skin: warm and dry, no rash Neuro:  Strength and sensation are intact Psych: euthymic mood, full affect   EKG:  EKG is ordered today. The ekg ordered today demonstrates atrial fibrillation with ventricular response of 123 bpm   Recent Labs: 04/19/2015: ALT 18; BUN 16; Creatinine, Ser 1.00; Hemoglobin 17.2*; Platelets 197.0; Potassium 4.2; Sodium 140; TSH 2.72    Lipid Panel    Component Value Date/Time   CHOL 196 04/19/2015 1000   TRIG 243.0* 04/19/2015 1000   TRIG 150* 09/06/2006 1031   HDL 41.10 04/19/2015 1000   CHOLHDL 5 04/19/2015 1000   CHOLHDL 5.3 CALC 09/06/2006 1031   VLDL 48.6* 04/19/2015 1000   LDLCALC 115* 04/15/2012 0937   LDLDIRECT 126.0 04/19/2015 1000      Wt Readings from Last 3 Encounters:  05/31/15 249 lb (112.946 kg)  04/19/15 250 lb (113.399 kg)  12/02/14 254 lb 9.6 oz (115.486 kg)        ASSESSMENT AND PLAN:  1. Chronic permanent atrial fibrillation. Inadequate heart rate control. The patient is on long-term warfarin followed by PCP 2. hypertensive heart disease without heart failure 3. hypercholesterolemia followed by PCP 4. Obesity 5. History of diverticulosis 6. History of frequent belching and indigestion .  He states that about 20 years ago he was tested for gallstones and none were found   Current medicines are reviewed at length with the patient today.  The patient does not have concerns regarding medicines.  The following changes have been made:  Increase metoprolol tartrate to 50 mg twice a day for better rate control.  Labs/ tests ordered today include:   Orders Placed This Encounter  Procedures  . EKG 12-Lead     Disposition: Recheck in 6 months for office visit and EKG  Signed, Darlin Coco MD 05/31/2015 5:14 PM    [facility] [place], [place], [place]  [address] Phone: [phone]; Fax: [phone]

## 2015-06-11 DIAGNOSIS — M1711 Unilateral primary osteoarthritis, right knee: Secondary | ICD-10-CM | POA: Diagnosis not present

## 2015-06-21 DIAGNOSIS — M1711 Unilateral primary osteoarthritis, right knee: Secondary | ICD-10-CM | POA: Diagnosis not present

## 2015-06-30 DIAGNOSIS — M1711 Unilateral primary osteoarthritis, right knee: Secondary | ICD-10-CM | POA: Diagnosis not present

## 2015-07-01 ENCOUNTER — Encounter: Payer: Self-pay | Admitting: Family

## 2015-07-08 ENCOUNTER — Ambulatory Visit: Payer: BLUE CROSS/BLUE SHIELD

## 2015-07-15 ENCOUNTER — Ambulatory Visit (INDEPENDENT_AMBULATORY_CARE_PROVIDER_SITE_OTHER): Payer: Medicare Other | Admitting: General Practice

## 2015-07-15 DIAGNOSIS — Z5181 Encounter for therapeutic drug level monitoring: Secondary | ICD-10-CM | POA: Diagnosis not present

## 2015-07-15 DIAGNOSIS — Z23 Encounter for immunization: Secondary | ICD-10-CM

## 2015-07-15 LAB — POCT INR: INR: 2.4

## 2015-07-15 NOTE — Progress Notes (Signed)
Pre visit review using our clinic review tool, if applicable. No additional management support is needed unless otherwise documented below in the visit note. 

## 2015-08-12 DIAGNOSIS — M1711 Unilateral primary osteoarthritis, right knee: Secondary | ICD-10-CM | POA: Diagnosis not present

## 2015-09-02 ENCOUNTER — Ambulatory Visit (INDEPENDENT_AMBULATORY_CARE_PROVIDER_SITE_OTHER): Payer: Medicare Other | Admitting: General Practice

## 2015-09-02 DIAGNOSIS — Z5181 Encounter for therapeutic drug level monitoring: Secondary | ICD-10-CM | POA: Diagnosis not present

## 2015-09-02 LAB — POCT INR: INR: 1.9

## 2015-09-02 NOTE — Progress Notes (Signed)
Pre visit review using our clinic review tool, if applicable. No additional management support is needed unless otherwise documented below in the visit note. 

## 2015-09-30 ENCOUNTER — Encounter: Payer: Self-pay | Admitting: Internal Medicine

## 2015-09-30 NOTE — Telephone Encounter (Signed)
Please call pt and schedule him to see someone tomorrow due to fall.

## 2015-10-01 ENCOUNTER — Encounter: Payer: Self-pay | Admitting: Family Medicine

## 2015-10-01 ENCOUNTER — Ambulatory Visit (INDEPENDENT_AMBULATORY_CARE_PROVIDER_SITE_OTHER): Payer: Medicare Other | Admitting: Family Medicine

## 2015-10-01 VITALS — BP 112/74 | HR 90 | Temp 99.0°F | Ht 72.0 in | Wt 250.9 lb

## 2015-10-01 DIAGNOSIS — R0781 Pleurodynia: Secondary | ICD-10-CM

## 2015-10-01 DIAGNOSIS — W19XXXA Unspecified fall, initial encounter: Secondary | ICD-10-CM | POA: Diagnosis not present

## 2015-10-01 DIAGNOSIS — Z7901 Long term (current) use of anticoagulants: Secondary | ICD-10-CM

## 2015-10-01 NOTE — Telephone Encounter (Signed)
Pt has been sch

## 2015-10-01 NOTE — Patient Instructions (Signed)
BEFORE YOU LEAVE: -xray sheet -follow up with PCP in 2-4 weeks  Get xray  Ice if needed

## 2015-10-01 NOTE — Progress Notes (Signed)
HPI:  Glen [name] is a pleasant 81 yo here for an acute visit for rib pain:  R lateral post rib pain: -started 5 days ago after mechanical fall against a chair -has some brusing here -does not hurt much, feels some pain with coughing and with lying on this side -denies: fevers, malaise, chills, SOB, pleuritic pain, LOC, heady injury -he has had minimal nasal congestion, mild cough for a few days -wife reports he fell in the yard this summer and tripped over something last month and fell - she got him a cane for Christmas -he denies any weakness, gait issues, balance issues and reports all falls were simply being clumsy - once in the dark in the yard  ROS: See pertinent positives and negatives per HPI.  Past Medical History  Diagnosis Date  . Atrial fibrillation ([place])   . Drug therapy     Anticoagulation Therapy  . Palpitation   . Hx of echocardiogram     a. Echo 5/11:  EF 55-60%, mild dilated ascending aorta, mod LAE  . Hypertension   . Hyperlipidemia   . Obesity   . Neoplasm of prostate     special screening malignant   . Diverticulitis   . History of colonic polyps   . FHx: colon cancer   . Family history of diabetes insipidus     Past Surgical History  Procedure Laterality Date  . Colonoscopy  December 2005  . Anal fistula repair    . Knee arthroscopy      Right  . Tonsillectomy    . Ett  1991    Negative  . Cataract extraction  2004  . Cardiac valve surgery  1996    status post balloon val[facility]ul[facility] at [facility]  . 2-d echocardiogram  November 2009, 2011    Family History  Problem Relation Age of Onset  . Colon cancer    . Diabetes    . Prostate cancer Brother   . Heart disease    . Heart attack Father   . Coronary artery disease      Two siblings, status post stenting to siblings with heart disease. One. Status post pacemaker insertion.  . Stroke Brother     Social History   Social History  . Marital Status: Married    Spouse Name: N/A  . Number  of Children: N/A  . Years of Education: N/A   Occupational History  . Retired    Social History Main Topics  . Smoking status: Never Smoker   . Smokeless tobacco: Never Used  . Alcohol Use: No  . Drug Use: None  . Sexual Activity: Not Asked   Other Topics Concern  . None   Social History Narrative   Regular exercise: Yes: [employer] 4 times/week     Current outpatient prescriptions:  .  acetaminophen (TYLENOL) 500 MG tablet, Take 1,000 mg by mouth every 6 (six) hours as needed for pain. , Disp: , Rfl:  .  metoprolol tartrate (LOPRESSOR) 50 MG tablet, Take 1 tablet (50 mg total) by mouth 2 (two) times daily., Disp: 60 tablet, Rfl: 11 .  Probiotic Product (PROBIOTIC DAILY PO), Take 1 capsule by mouth daily as needed. , Disp: , Rfl:  .  warfarin (COUMADIN) 5 MG tablet, Take as directed by anticoagulation clinic, Disp: 90 tablet, Rfl: 3  EXAM:  Filed Vitals:   10/01/15 1456  BP: 112/74  Pulse: 90  Temp: 99 F (37.2 C)    Body mass index is 34.02  kg/(m^2).  GENERAL: vitals reviewed and listed above, alert, oriented, appears well hydrated and in no acute distress  HEENT: atraumatic, conjunttiva clear, no obvious abnormalities on inspection of external nose and ears, normal appearance of ear canals and TMs, clear nasal congestion, mild post oropharyngeal erythema with PND, no tonsillar edema or exudate, no sinus TTP  NECK: no obvious masses on inspection  LUNGS: clear to auscultation bilaterally, no wheezes, rales or rhonchi, good air movement  CV: HR irr irr, no peripheral edema  MS: on inspection he has some bruising over the L lateral lower ribs, minimal TTP which seems to be morning in the soft tissues, moves all extremities without noticeable abnormality  PSYCH: pleasant and cooperative, no obvious depression or anxiety  ASSESSMENT AND PLAN:  Discussed the following assessment and plan:  Rib pain - Plan: DG Ribs Unilateral Left  Fall, initial encounter  Current  use of long term anticoagulation  -CXR per their request to eval for rib fx - doubt this or underlying contusion  -ice and fall precautions, he does not feel he needs pain medication -advise they discuss his falls further with PCP and cardiologist to ensure they are aware given on anticoagulation -Patient advised to return or notify a doctor immediately if symptoms worsen or persist or new concerns arise.  Patient Instructions  BEFORE YOU LEAVE: -xray sheet -follow up with PCP in 2-4 weeks  Get xray  Ice if needed      Harue Pribble, [employer].

## 2015-10-01 NOTE — Progress Notes (Signed)
Pre visit review using our clinic review tool, if applicable. No additional management support is needed unless otherwise documented below in the visit note. 

## 2015-10-02 ENCOUNTER — Encounter: Payer: Self-pay | Admitting: Cardiology

## 2015-10-02 ENCOUNTER — Encounter: Payer: Self-pay | Admitting: Internal Medicine

## 2015-10-05 NOTE — Telephone Encounter (Signed)
Pt has been sch for 10-15-15

## 2015-10-05 NOTE — Telephone Encounter (Signed)
Please call pt and schedule appt. Thanks

## 2015-10-06 ENCOUNTER — Telehealth: Payer: Self-pay | Admitting: *Deleted

## 2015-10-06 ENCOUNTER — Encounter: Payer: Self-pay | Admitting: Family

## 2015-10-06 MED ORDER — METOPROLOL TARTRATE 25 MG PO TABS: 25.0000 mg | ORAL_TABLET | Freq: Two times a day (BID) | ORAL | Status: DC

## 2015-10-06 NOTE — Telephone Encounter (Signed)
Messages per [facility] with patient, recommendations given.  Verbalized understanding    His PCP thought that his lopressor could be contributing to his falling spells. Let's have him reduce his lopressor from 50 mg BID to 25 mg BID and see if he feels better.    TB      From: [name]      Sent: 10/02/2015  9:21 AM      To: [address] Triage    Subject: Non-Urgent Medical Question                 [place] on [place] Day. have pain on right side of my back. Saw DR Maudie Mercury yesterday. She said it was my call to get x-ray. Felt I should tell you I have fallen several times lately. She though that Metoprolol 50mg  tablet my be one of the reason that I fall. Last few weeks my shoes fell like they are to small. Dr. Burnice Logan was out town yesterday and I should follow up with him in 2 weeks

## 2015-10-07 DIAGNOSIS — M25561 Pain in right knee: Secondary | ICD-10-CM | POA: Diagnosis not present

## 2015-10-07 DIAGNOSIS — M1711 Unilateral primary osteoarthritis, right knee: Secondary | ICD-10-CM | POA: Diagnosis not present

## 2015-10-14 ENCOUNTER — Ambulatory Visit (INDEPENDENT_AMBULATORY_CARE_PROVIDER_SITE_OTHER): Payer: Medicare Other | Admitting: General Practice

## 2015-10-14 DIAGNOSIS — Z5181 Encounter for therapeutic drug level monitoring: Secondary | ICD-10-CM | POA: Diagnosis not present

## 2015-10-14 LAB — POCT INR: INR: 2

## 2015-10-14 NOTE — Progress Notes (Signed)
Pre visit review using our clinic review tool, if applicable. No additional management support is needed unless otherwise documented below in the visit note. 

## 2015-10-15 ENCOUNTER — Ambulatory Visit (INDEPENDENT_AMBULATORY_CARE_PROVIDER_SITE_OTHER): Payer: Medicare Other | Admitting: Internal Medicine

## 2015-10-15 ENCOUNTER — Encounter: Payer: Self-pay | Admitting: Internal Medicine

## 2015-10-15 VITALS — BP 120/80 | HR 100 | Temp 98.0°F | Resp 20 | Ht 72.0 in | Wt 242.0 lb

## 2015-10-15 DIAGNOSIS — E669 Obesity, unspecified: Secondary | ICD-10-CM

## 2015-10-15 DIAGNOSIS — I482 Chronic atrial fibrillation, unspecified: Secondary | ICD-10-CM

## 2015-10-15 DIAGNOSIS — I10 Essential (primary) hypertension: Secondary | ICD-10-CM | POA: Diagnosis not present

## 2015-10-15 NOTE — Progress Notes (Signed)
Subjective:    Patient ID: Glen [name], male    DOB: 1928-08-11, 82 y.o.   MRN: [id number]  HPI  82 year old patient who is seen today in follow-up.  He has essential hypertension, chronic atrial fibrillation and remains on chronic Coumadin anticoagulation.  He fell approximately 2 weeks ago sustaining some chest wall trauma. He has had some mild orthostatic symptoms and metoprolol dose has been down titrated to 25 mg twice daily.  He generally feels well today He still describes a slight sense of unsteadiness when he first stands from a sitting position.  He uses a cane.  No further falls  Past Medical History  Diagnosis Date  . Atrial fibrillation ([place])   . Drug therapy     Anticoagulation Therapy  . Palpitation   . Hx of echocardiogram     a. Echo 5/11:  EF 55-60%, mild dilated ascending aorta, mod LAE  . Hypertension   . Hyperlipidemia   . Obesity   . Neoplasm of prostate     special screening malignant   . Diverticulitis   . History of colonic polyps   . FHx: colon cancer   . Family history of diabetes insipidus     Social History   Social History  . Marital Status: Married    Spouse Name: N/A  . Number of Children: N/A  . Years of Education: N/A   Occupational History  . Retired    Social History Main Topics  . Smoking status: Never Smoker   . Smokeless tobacco: Never Used  . Alcohol Use: No  . Drug Use: Not on file  . Sexual Activity: Not on file   Other Topics Concern  . Not on file   Social History Narrative   Regular exercise: Yes: [employer] 4 times/week    Past Surgical History  Procedure Laterality Date  . Colonoscopy  December 2005  . Anal fistula repair    . Knee arthroscopy      Right  . Tonsillectomy    . Ett  1991    Negative  . Cataract extraction  2004  . Cardiac valve surgery  1996    status post balloon valvuloplasty at [facility]  . 2-d echocardiogram  November 2009, 2011    Family History  Problem Relation Age of Onset  . Colon  cancer    . Diabetes    . Prostate cancer Brother   . Heart disease    . Heart attack Father   . Coronary artery disease      Two siblings, status post stenting to siblings with heart disease. One. Status post pacemaker insertion.  . Stroke Brother     Allergies  Allergen Reactions  . Codeine Other (See Comments)    constpation  . Diltiazem     Gingival hyperplasia    Current Outpatient Prescriptions on File Prior to Visit  Medication Sig Dispense Refill  . acetaminophen (TYLENOL) 500 MG tablet Take 1,000 mg by mouth every 6 (six) hours as needed for pain.     . metoprolol (LOPRESSOR) 25 MG tablet Take 1 tablet (25 mg total) by mouth 2 (two) times daily. 60 tablet 3  . Probiotic Product (PROBIOTIC DAILY PO) Take 1 capsule by mouth daily as needed.     . warfarin (COUMADIN) 5 MG tablet Take as directed by anticoagulation clinic 90 tablet 3   No current facility-administered medications on file prior to visit.    BP 120/80 mmHg  Pulse 100  Temp(Src) 98  F (36.7 C) (Oral)  Resp 20  Ht 6' (1.829 m)  Wt 242 lb (109.77 kg)  BMI 32.81 kg/m2  SpO2 98%     Review of Systems  Constitutional: Negative for fever, chills, appetite change and fatigue.  HENT: Negative for congestion, dental problem, ear pain, hearing loss, sore throat, tinnitus, trouble swallowing and voice change.   Eyes: Negative for pain, discharge and visual disturbance.  Respiratory: Negative for cough, chest tightness, wheezing and stridor.   Cardiovascular: Positive for chest pain. Negative for palpitations and leg swelling.  Gastrointestinal: Negative for nausea, vomiting, abdominal pain, diarrhea, constipation, blood in stool and abdominal distention.  Genitourinary: Negative for urgency, hematuria, flank pain, discharge, difficulty urinating and genital sores.  Musculoskeletal: Negative for myalgias, back pain, joint swelling, arthralgias, gait problem and neck stiffness.  Skin: Negative for rash.    Neurological: Positive for light-headedness. Negative for dizziness, syncope, speech difficulty, weakness, numbness and headaches.  Hematological: Negative for adenopathy. Does not bruise/bleed easily.  Psychiatric/Behavioral: Negative for behavioral problems and dysphoric mood. The patient is not nervous/anxious.        Objective:   Physical Exam  Constitutional: He is oriented to person, place, and time. He appears well-developed. No distress.   Blood pressure on arrival 120 over 80 Follow blood pressure 110/70  Pulse 90-100  HENT:  Head: Normocephalic.  Right Ear: External ear normal.  Left Ear: External ear normal.  Eyes: Conjunctivae and EOM are normal.  Neck: Normal range of motion.  Cardiovascular: Normal rate and normal heart sounds.   Irregular rhythm with controlled ventricular response  Pulmonary/Chest: He has rales.  Basilar rales, right greater than left  Abdominal: Bowel sounds are normal.  Musculoskeletal: Normal range of motion. He exhibits no edema or tenderness.  Neurological: He is alert and oriented to person, place, and time.  Psychiatric: He has a normal mood and affect. His behavior is normal.          Assessment & Plan:   Essential hypertension Chronic atrial fibrillation  Blood pressure remains low normal on present regimen.  No change in therapy.  Follow-up cardiology next month as scheduled  History recent fall with chest wall discomfort

## 2015-10-15 NOTE — Patient Instructions (Signed)
Limit your sodium (Salt) intake  Cardiology follow-up as scheduled  You need to lose weight.  Consider a lower calorie diet and regular exercise.

## 2015-10-15 NOTE — Progress Notes (Signed)
Pre visit review using our clinic review tool, if applicable. No additional management support is needed unless otherwise documented below in the visit note. 

## 2015-11-10 ENCOUNTER — Encounter: Payer: Self-pay | Admitting: Podiatry

## 2015-11-10 ENCOUNTER — Ambulatory Visit (INDEPENDENT_AMBULATORY_CARE_PROVIDER_SITE_OTHER): Payer: Medicare Other | Admitting: Podiatry

## 2015-11-10 VITALS — BP 135/78 | HR 122 | Resp 14

## 2015-11-10 DIAGNOSIS — B351 Tinea unguium: Secondary | ICD-10-CM

## 2015-11-10 DIAGNOSIS — M79673 Pain in unspecified foot: Secondary | ICD-10-CM | POA: Diagnosis not present

## 2015-11-10 DIAGNOSIS — M79609 Pain in unspecified limb: Principal | ICD-10-CM

## 2015-11-10 NOTE — Progress Notes (Addendum)
       Patient ID: Glen [name], male    DOB: [birth year], 82 y.o.   MRN: [id number]  HPIthis patient presents the office with chief complaint of long thick nails.  This patient says he works on his own nails and always cuts his toes.  His medical doctor told him to make an appointment to have his nails done here.  He presents for preventive care services.    Review of Systems  All other systems reviewed and are negative.      Objective:   Physical Exam GENERAL APPEARANCE: Alert, conversant. Appropriately groomed. No acute distress.  VASCULAR: Pedal pulses palpable at  [facility] and PT bilateral.  Capillary refill time is immediate to all digits,  Normal temperature gradient.  Digital hair growth is present bilateral  NEUROLOGIC: sensation is normal to 5.07 monofilament at 5/5 sites bilateral.  Light touch is intact bilateral, Muscle strength normal.  MUSCULOSKELETAL: acceptable muscle strength, tone and stability bilateral.  Intrinsic muscluature intact bilateral.  Rectus appearance of foot and digits noted bilateral.   DERMATOLOGIC: skin color, texture, and turgor are within normal limits.  No preulcerative lesions or ulcers  are seen, no interdigital maceration noted.  No open lesions present.  . No drainage noted.  NAILS  Thick disfigured discolored nails both feet.         Assessment & Plan:  Onychomycosis    IE  Debridement of his nails.  RTC 3 months.   Gardiner Barefoot DPM

## 2015-11-15 ENCOUNTER — Encounter: Payer: Self-pay | Admitting: Cardiology

## 2015-11-18 ENCOUNTER — Ambulatory Visit (INDEPENDENT_AMBULATORY_CARE_PROVIDER_SITE_OTHER): Payer: Medicare Other | Admitting: Cardiology

## 2015-11-18 ENCOUNTER — Encounter: Payer: Self-pay | Admitting: Cardiology

## 2015-11-18 ENCOUNTER — Ambulatory Visit
Admission: RE | Admit: 2015-11-18 | Discharge: 2015-11-18 | Disposition: A | Discharge status: Home / Self Care | Payer: Medicare Other | Source: Ambulatory Visit | Attending: Cardiology | Admitting: Cardiology

## 2015-11-18 VITALS — BP 154/70 | HR 100 | Ht 72.0 in | Wt 241.8 lb

## 2015-11-18 DIAGNOSIS — R0600 Dyspnea, unspecified: Secondary | ICD-10-CM

## 2015-11-18 DIAGNOSIS — I482 Chronic atrial fibrillation, unspecified: Secondary | ICD-10-CM

## 2015-11-18 DIAGNOSIS — R0609 Other forms of dyspnea: Principal | ICD-10-CM

## 2015-11-18 DIAGNOSIS — R600 Localized edema: Secondary | ICD-10-CM | POA: Insufficient documentation

## 2015-11-18 DIAGNOSIS — I10 Essential (primary) hypertension: Secondary | ICD-10-CM | POA: Diagnosis not present

## 2015-11-18 DIAGNOSIS — R609 Edema, unspecified: Secondary | ICD-10-CM | POA: Diagnosis not present

## 2015-11-18 DIAGNOSIS — I4891 Unspecified atrial fibrillation: Secondary | ICD-10-CM | POA: Diagnosis not present

## 2015-11-18 LAB — BASIC METABOLIC PANEL
BUN: 15 mg/dL (ref 7–25)
CO2: 25 mmol/L (ref 20–31)
Calcium: 9.4 mg/dL (ref 8.6–10.3)
Chloride: 105 mmol/L (ref 98–110)
Creat: 1 mg/dL (ref 0.70–1.11)
Glucose, Bld: 130 mg/dL — ABNORMAL HIGH (ref 65–99)
Potassium: 4.4 mmol/L (ref 3.5–5.3)
Sodium: 137 mmol/L (ref 135–146)

## 2015-11-18 LAB — CBC WITH DIFFERENTIAL/PLATELET
Basophils Absolute: 0.1 10*3/uL (ref 0.0–0.1)
Basophils Relative: 1 % (ref 0–1)
Eosinophils Absolute: 0.1 10*3/uL (ref 0.0–0.7)
Eosinophils Relative: 1 % (ref 0–5)
HCT: 49.9 % (ref 39.0–52.0)
Hemoglobin: 17 g/dL (ref 13.0–17.0)
Lymphocytes Relative: 21 % (ref 12–46)
Lymphs Abs: 1.8 10*3/uL (ref 0.7–4.0)
MCH: 32.2 pg (ref 26.0–34.0)
MCHC: 34.1 g/dL (ref 30.0–36.0)
MCV: 94.5 fL (ref 78.0–100.0)
MPV: 10.7 fL (ref 8.6–12.4)
Monocytes Absolute: 0.7 10*3/uL (ref 0.1–1.0)
Monocytes Relative: 8 % (ref 3–12)
Neutro Abs: 6.1 10*3/uL (ref 1.7–7.7)
Neutrophils Relative %: 69 % (ref 43–77)
Platelets: 206 10*3/uL (ref 150–400)
RBC: 5.28 MIL/uL (ref 4.22–5.81)
RDW: 13.6 % (ref 11.5–15.5)
WBC: 8.8 10*3/uL (ref 4.0–10.5)

## 2015-11-18 NOTE — Patient Instructions (Signed)
Medication Instructions:  Your physician recommends that you continue on your current medications as directed. Please refer to the Current Medication list given to you today.  Labwork: Bmet/cbc/bnp  Testing/Procedures: A chest x-ray takes a picture of the organs and structures inside the chest, including the heart, lungs, and blood vessels. This test can show several things, including, whether the heart is enlarges; whether fluid is building up in the lungs; and whether pacemaker / defibrillator leads are still in place. [facility] [place]   Follow-Up: Your physician wants you to follow-up in: September ov with Dr Dow Adolph will receive a reminder letter in the mail two months in advance. If you don't receive a letter, please call our office to schedule the follow-up appointment.   Any Other Special Instructions Will Be Listed Below (If Applicable).     If you need a refill on your cardiac medications before your next appointment, please call your pharmacy.

## 2015-11-18 NOTE — Progress Notes (Signed)
Cardiology Office Note   Date:  11/18/2015   ID:  [name], DOB [birth year], MRN [id number]  PCP:  Nyoka Cowden, MD  Cardiologist: Darlin Coco MD  Chief Complaint  Patient presents with  . routine follow up      History of Present Illness: [name] is a 82 y.o. male who presents for 6 month follow-up visit.  Patient has a history of permanent atrial fibrillation. He also has a history of hypertension and hyperlipidemia. His CHADSS score equals 2 for age and hypertension. His Coumadin is followed by his PCP. The patient does not have any history of congestive heart failure. He had an echocardiogram in May 2011 showing an ejection fraction of 55-60%. His atrial fibrillation has been managed with rate control. He has never had attempts at cardioversion. He had a previous attempt to switch him from beta blocker to diltiazem. However he developed significant periodontal disease and swelling of his gingiva from the diltiazem. He is now back on his beta blocker and is feeling well. He initially was on atenolol which did not adequately control his rate. At his last visit he was switched to metoprolol tartrate 50 mg twice a day.  However, he felt that the metoprolol might be causing fluid in his feet and so he on his own dropped his dose back to 25 mg twice a day. His pulse rate is still fast but has improved since his last office visit.  Heart rate today is 110 bpm.  He does have some exertional dyspnea. He sleeps on one pillow. He cannot do much walking because of problems with his right knee. Dr. Wynelle Link is his orthopedist and has been giving him injections into the knee. His main complaint is that his feet feel puffy on the bottoms.  He saw a podiatrist recently who told him that structurally his feet were in good shape and that his circulation was good.  Past Medical History  Diagnosis Date  . Atrial fibrillation ([place])   . Drug therapy     Anticoagulation  Therapy  . Palpitation   . Hx of echocardiogram     a. Echo 5/11:  EF 55-60%, mild dilated ascending aorta, mod LAE  . Hypertension   . Hyperlipidemia   . Obesity   . Neoplasm of prostate     special screening malignant   . Diverticulitis   . History of colonic polyps   . FHx: colon cancer   . Family history of diabetes insipidus     Past Surgical History  Procedure Laterality Date  . Colonoscopy  December 2005  . Anal fistula repair    . Knee arthroscopy      Right  . Tonsillectomy    . Ett  1991    Negative  . Cataract extraction  2004  . Cardiac valve surgery  1996    status post balloon val[facility]ul[facility] at [facility]  . 2-d echocardiogram  November 2009, 2011     Current Outpatient Prescriptions  Medication Sig Dispense Refill  . acetaminophen (TYLENOL) 500 MG tablet Take 1,000 mg by mouth every 6 (six) hours as needed for pain.     . metoprolol (LOPRESSOR) 25 MG tablet Take 1 tablet (25 mg total) by mouth 2 (two) times daily. 60 tablet 3  . Probiotic Product (PROBIOTIC DAILY PO) Take 1 capsule by mouth daily as needed (supplement).     . warfarin (COUMADIN) 5 MG tablet Take as directed by anticoagulation clinic 90 tablet 3  No current facility-administered medications for this visit.    Allergies:   Codeine and Diltiazem    Social History:  The patient  reports that he has never smoked. He has never used smokeless tobacco. He reports that he does not drink alcohol.   Family History:  The patient's family history includes Heart attack in his father; Prostate cancer in his brother; Stroke in his brother.    ROS:  Please see the history of present illness.   Otherwise, review of systems are positive for none.   All other systems are reviewed and negative.    PHYSICAL EXAM: VS:  BP 154/70 mmHg  Pulse 100  Ht 6' (1.829 m)  Wt 241 lb 12.8 oz (109.68 kg)  BMI 32.79 kg/m2 , BMI Body mass index is 32.79 kg/(m^2). GEN: Well nourished, well developed, in no acute  distress HEENT: normal Neck: no JVD, carotid bruits, or masses Cardiac: Irregularly irregular, no murmurs, rubs, or gallops, there is trace ankle edema.  Pedal pulses are present.  The feet are cool to the touch. Respiratory:  clear to auscultation bilaterally, normal work of breathing GI: soft, nontender, nondistended, + BS MS: no deformity or atrophy Skin: warm and dry, no rash Neuro:  Strength and sensation are intact Psych: euthymic mood, full affect   EKG:  EKG is ordered today. The ekg ordered today demonstrates atrial fibrillation with heart rate 110 bpm.  Since the previous tracing of 05/31/15, heart rate is slower.  Nonspecific T-wave changes are present.   Recent Labs: 04/19/2015: ALT 18; BUN 16; Creatinine, Ser 1.00; Hemoglobin 17.2*; Platelets 197.0; Potassium 4.2; Sodium 140; TSH 2.72    Lipid Panel    Component Value Date/Time   CHOL 196 04/19/2015 1000   TRIG 243.0* 04/19/2015 1000   TRIG 150* 09/06/2006 1031   HDL 41.10 04/19/2015 1000   CHOLHDL 5 04/19/2015 1000   CHOLHDL 5.3 CALC 09/06/2006 1031   VLDL 48.6* 04/19/2015 1000   LDLCALC 115* 04/15/2012 0937   LDLDIRECT 126.0 04/19/2015 1000      Wt Readings from Last 3 Encounters:  11/18/15 241 lb 12.8 oz (109.68 kg)  10/15/15 242 lb (109.77 kg)  10/01/15 250 lb 14.4 oz (113.807 kg)         ASSESSMENT AND PLAN:  1. Chronic permanent atrial fibrillation.  The patient is on long-term warfarin followed by PCP 2. hypertensive heart disease without heart failure 3. hypercholesterolemia followed by PCP 4. Obesity 5. History of diverticulosis 6. History of frequent belching and indigestion . He states that about 20 years ago he was tested for gallstones and none were found. 7.  Complained of swelling of his feet.  There is minimal swelling unobservable today.  He is not on amlodipine.  We will get a chest x-ray today.  We will get CBC and basal metabolic panel and B natruretic peptide.  Consider  adding low-dose diuretic depending on the above results.  Current medicines are reviewed at length with the patient today.  The patient does not have concerns regarding medicines.  The following changes have been made:  no change  Labs/ tests ordered today include:   Orders Placed This Encounter  Procedures  . DG Chest 2 View  . Basic metabolic panel  . B Nat Peptide  . CBC with Differential/Platelet  . EKG 12-Lead    Disposition: Continue current medication for the time being.  Recheck in September with Dr. Ellouise Newer.  Berna Spare MD 11/18/2015 11:06 AM  [facility] [address], [place], [place]  [address] Phone: [phone]; Fax: [phone]

## 2015-11-19 LAB — BRAIN NATRIURETIC PEPTIDE: Brain Natriuretic Peptide: 27.4 pg/mL (ref <100)

## 2015-11-19 NOTE — Progress Notes (Signed)
Quick Note:  Please report to patient. The recent labs are stable. Continue same medication and careful diet. The B natruretic peptide is low. There is no evidence of congestive heart failure either by x-ray or by blood tests. Continue current medication. ______

## 2015-11-22 ENCOUNTER — Ambulatory Visit (INDEPENDENT_AMBULATORY_CARE_PROVIDER_SITE_OTHER): Payer: Medicare Other | Admitting: General Practice

## 2015-11-22 DIAGNOSIS — Z5181 Encounter for therapeutic drug level monitoring: Secondary | ICD-10-CM

## 2015-11-22 LAB — POCT INR: INR: 2

## 2015-11-22 NOTE — Progress Notes (Signed)
Pre visit review using our clinic review tool, if applicable. No additional management support is needed unless otherwise documented below in the visit note. 

## 2015-11-29 ENCOUNTER — Ambulatory Visit: Payer: Medicare Other | Admitting: Cardiology

## 2015-12-01 ENCOUNTER — Encounter: Payer: Self-pay | Admitting: Internal Medicine

## 2015-12-03 ENCOUNTER — Encounter: Payer: Self-pay | Admitting: Internal Medicine

## 2015-12-15 ENCOUNTER — Encounter: Payer: Self-pay | Admitting: Internal Medicine

## 2015-12-28 ENCOUNTER — Encounter: Payer: Self-pay | Admitting: Internal Medicine

## 2016-01-03 ENCOUNTER — Ambulatory Visit: Payer: Medicare Other

## 2016-01-03 ENCOUNTER — Ambulatory Visit (INDEPENDENT_AMBULATORY_CARE_PROVIDER_SITE_OTHER): Payer: Medicare Other | Admitting: General Practice

## 2016-01-03 DIAGNOSIS — Z5181 Encounter for therapeutic drug level monitoring: Secondary | ICD-10-CM

## 2016-01-03 LAB — POCT INR: INR: 1.7

## 2016-01-03 NOTE — Progress Notes (Signed)
Pre visit review using our clinic review tool, if applicable. No additional management support is needed unless otherwise documented below in the visit note. 

## 2016-01-25 ENCOUNTER — Encounter: Payer: Self-pay | Admitting: Internal Medicine

## 2016-01-27 ENCOUNTER — Encounter: Payer: Self-pay | Admitting: Internal Medicine

## 2016-01-27 ENCOUNTER — Ambulatory Visit (INDEPENDENT_AMBULATORY_CARE_PROVIDER_SITE_OTHER): Payer: Medicare Other | Admitting: Internal Medicine

## 2016-01-27 VITALS — BP 100/64 | HR 84 | Temp 97.4°F | Resp 20 | Ht 72.0 in | Wt 241.0 lb

## 2016-01-27 DIAGNOSIS — I10 Essential (primary) hypertension: Secondary | ICD-10-CM | POA: Diagnosis not present

## 2016-01-27 DIAGNOSIS — R609 Edema, unspecified: Secondary | ICD-10-CM | POA: Diagnosis not present

## 2016-01-27 DIAGNOSIS — I482 Chronic atrial fibrillation, unspecified: Secondary | ICD-10-CM

## 2016-01-27 MED ORDER — FUROSEMIDE 20 MG PO TABS: 20.0000 mg | ORAL_TABLET | Freq: Every day | ORAL | Status: DC

## 2016-01-27 NOTE — Patient Instructions (Signed)
Limit your sodium (Salt) intake  Elevate your legs as much as possible  Consider support/ TED hose/ stockings  Furosemide 20 mg as needed to control edema.  Not to exceed 3 times per week  Low-Sodium Eating Plan Sodium raises blood pressure and causes water to be held in the body. Getting less sodium from food will help lower your blood pressure, reduce any swelling, and protect your heart, liver, and kidneys. We get sodium by adding salt (sodium chloride) to food. Most of our sodium comes from canned, boxed, and frozen foods. Restaurant foods, fast foods, and pizza are also very high in sodium. Even if you take medicine to lower your blood pressure or to reduce fluid in your body, getting less sodium from your food is important. WHAT IS MY PLAN? Most people should limit their sodium intake to 2,300 mg a day. Your health care provider recommends that you limit your sodium intake to __________ a day.  WHAT DO I NEED TO KNOW ABOUT THIS EATING PLAN? For the low-sodium eating plan, you will follow these general guidelines:  Choose foods with a % Daily Value for sodium of less than 5% (as listed on the food label).   Use salt-free seasonings or herbs instead of table salt or sea salt.   Check with your health care provider or pharmacist before using salt substitutes.   Eat fresh foods.  Eat more vegetables and fruits.  Limit canned vegetables. If you do use them, rinse them well to decrease the sodium.   Limit cheese to 1 oz (28 g) per day.   Eat lower-sodium products, often labeled as "lower sodium" or "no salt added."  Avoid foods that contain monosodium glutamate (MSG). MSG is sometimes added to [place] food and some canned foods.  Check food labels (Nutrition Facts labels) on foods to learn how much sodium is in one serving.  Eat more home-cooked food and less restaurant, buffet, and fast food.  When eating at a restaurant, ask that your food be prepared with less salt,  or no salt if possible.  HOW DO I READ FOOD LABELS FOR SODIUM INFORMATION? The Nutrition Facts label lists the amount of sodium in one serving of the food. If you eat more than one serving, you must multiply the listed amount of sodium by the number of servings. Food labels may also identify foods as:  Sodium free--Less than 5 mg in a serving.  Very low sodium--35 mg or less in a serving.  Low sodium--140 mg or less in a serving.  Light in sodium--50% less sodium in a serving. For example, if a food that usually has 300 mg of sodium is changed to become light in sodium, it will have 150 mg of sodium.  Reduced sodium--25% less sodium in a serving. For example, if a food that usually has 400 mg of sodium is changed to reduced sodium, it will have 300 mg of sodium. WHAT FOODS CAN I EAT? Grains Low-sodium cereals, including oats, puffed wheat and rice, and shredded wheat cereals. Low-sodium crackers. Unsalted rice and pasta. Lower-sodium bread.  Vegetables Frozen or fresh vegetables. Low-sodium or reduced-sodium canned vegetables. Low-sodium or reduced-sodium tomato sauce and paste. Low-sodium or reduced-sodium tomato and vegetable juices.  Fruits Fresh, frozen, and canned fruit. Fruit juice.  Meat and Other Protein Products Low-sodium canned tuna and salmon. Fresh or frozen meat, poultry, seafood, and fish. Lamb. Unsalted nuts. Dried beans, peas, and lentils without added salt. Unsalted canned beans. Homemade soups without salt. Eggs.  Dairy Milk. Soy milk. Ricotta cheese. Low-sodium or reduced-sodium cheeses. Yogurt.  Condiments Fresh and dried herbs and spices. Salt-free seasonings. Onion and garlic powders. Low-sodium varieties of mustard and ketchup. Fresh or refrigerated horseradish. Lemon juice.  Fats and Oils Reduced-sodium salad dressings. Unsalted butter.  Other Unsalted popcorn and pretzels.  The items listed above may not be a complete list of recommended foods or  beverages. Contact your dietitian for more options. WHAT FOODS ARE NOT RECOMMENDED? Grains Instant hot cereals. Bread stuffing, pancake, and biscuit mixes. Croutons. Seasoned rice or pasta mixes. Noodle soup cups. Boxed or frozen macaroni and cheese. Self-rising flour. Regular salted crackers. Vegetables Regular canned vegetables. Regular canned tomato sauce and paste. Regular tomato and vegetable juices. Frozen vegetables in sauces. Salted [place] fries. Olives. Angie Fava. Relishes. Sauerkraut. Salsa. Meat and Other Protein Products Salted, canned, smoked, spiced, or pickled meats, seafood, or fish. Bacon, ham, sausage, hot dogs, corned beef, chipped beef, and packaged luncheon meats. Salt pork. Jerky. Pickled herring. Anchovies, regular canned tuna, and sardines. Salted nuts. Dairy Processed cheese and cheese spreads. Cheese curds. Blue cheese and cottage cheese. Buttermilk.  Condiments Onion and garlic salt, seasoned salt, table salt, and sea salt. Canned and packaged gravies. Worcestershire sauce. Tartar sauce. Barbecue sauce. Teriyaki sauce. Soy sauce, including reduced sodium. Steak sauce. Fish sauce. Oyster sauce. Cocktail sauce. Horseradish that you find on the shelf. Regular ketchup and mustard. Meat flavorings and tenderizers. Bouillon cubes. Hot sauce. Tabasco sauce. Marinades. Taco seasonings. Relishes. Fats and Oils Regular salad dressings. Salted butter. Margarine. Ghee. Bacon fat.  Other Potato and tortilla chips. Corn chips and puffs. Salted popcorn and pretzels. Canned or dried soups. Pizza. Frozen entrees and pot pies.  The items listed above may not be a complete list of foods and beverages to avoid. Contact your dietitian for more information.   This information is not intended to replace advice given to you by your health care provider. Make sure you discuss any questions you have with your health care provider.   Document Released: 03/10/2002 Document Revised:  10/09/2014 Document Reviewed: 07/23/2013 Elsevier Interactive Patient Education [employer].

## 2016-01-27 NOTE — Progress Notes (Signed)
Subjective:    Patient ID: Glen [name], male    DOB: 10/01/1928, 82 y.o.   MRN: [id number]  HPI  82 year old patient who is seen today complaining of pedal edema.  He is seen by cardiology 2 months ago with the same complaint.  He has a history of permanent atrial fibrillation and normal LV function.  Electrolytes, renal indices and BNP all normal.  2 months ago.  Denies any symptoms of heart failure Pedal edema is slightly bothersome when he walks.  He states that at times he must wear larger shoes. He does a fair job of restricting salt  Past Medical History  Diagnosis Date  . Atrial fibrillation ([place])   . Drug therapy     Anticoagulation Therapy  . Palpitation   . Hx of echocardiogram     a. Echo 5/11:  EF 55-60%, mild dilated ascending aorta, mod LAE  . Hypertension   . Hyperlipidemia   . Obesity   . Neoplasm of prostate     special screening malignant   . Diverticulitis   . History of colonic polyps   . FHx: colon cancer   . Family history of diabetes insipidus      Social History   Social History  . Marital Status: Married    Spouse Name: N/A  . Number of Children: N/A  . Years of Education: N/A   Occupational History  . Retired    Social History Main Topics  . Smoking status: Never Smoker   . Smokeless tobacco: Never Used  . Alcohol Use: No  . Drug Use: Not on file  . Sexual Activity: Not on file   Other Topics Concern  . Not on file   Social History Narrative   Regular exercise: Yes: [employer] 4 times/week    Past Surgical History  Procedure Laterality Date  . Colonoscopy  December 2005  . Anal fistula repair    . Knee arthroscopy      Right  . Tonsillectomy    . Ett  1991    Negative  . Cataract extraction  2004  . Cardiac valve surgery  1996    status post balloon valvuloplasty at [facility]  . 2-d echocardiogram  November 2009, 2011    Family History  Problem Relation Age of Onset  . Colon cancer    . Diabetes    . Prostate cancer Brother    . Heart disease    . Heart attack Father   . Coronary artery disease      Two siblings, status post stenting to siblings with heart disease. One. Status post pacemaker insertion.  . Stroke Brother     Allergies  Allergen Reactions  . Codeine Other (See Comments)    constpation  . Diltiazem     Gingival hyperplasia    Current Outpatient Prescriptions on File Prior to Visit  Medication Sig Dispense Refill  . acetaminophen (TYLENOL) 500 MG tablet Take 1,000 mg by mouth every 6 (six) hours as needed for pain.     . metoprolol (LOPRESSOR) 25 MG tablet Take 1 tablet (25 mg total) by mouth 2 (two) times daily. 60 tablet 3  . Probiotic Product (PROBIOTIC DAILY PO) Take 1 capsule by mouth daily as needed (supplement).     . warfarin (COUMADIN) 5 MG tablet Take as directed by anticoagulation clinic 90 tablet 3   No current facility-administered medications on file prior to visit.    BP 100/64 mmHg  Pulse 84  Temp(Src) 97.4  F (36.3 C) (Oral)  Resp 20  Ht 6' (1.829 m)  Wt 241 lb (109.317 kg)  BMI 32.68 kg/m2  SpO2 98%     Review of Systems  Constitutional: Negative for fever, chills, appetite change and fatigue.  HENT: Negative for congestion, dental problem, ear pain, hearing loss, sore throat, tinnitus, trouble swallowing and voice change.   Eyes: Negative for pain, discharge and visual disturbance.  Respiratory: Negative for cough, chest tightness, wheezing and stridor.   Cardiovascular: Positive for leg swelling. Negative for chest pain and palpitations.  Gastrointestinal: Negative for nausea, vomiting, abdominal pain, diarrhea, constipation, blood in stool and abdominal distention.  Genitourinary: Negative for urgency, hematuria, flank pain, discharge, difficulty urinating and genital sores.  Musculoskeletal: Negative for myalgias, back pain, joint swelling, arthralgias, gait problem and neck stiffness.  Skin: Negative for rash.  Neurological: Negative for dizziness,  syncope, speech difficulty, weakness, numbness and headaches.  Hematological: Negative for adenopathy. Does not bruise/bleed easily.  Psychiatric/Behavioral: Negative for behavioral problems and dysphoric mood. The patient is not nervous/anxious.        Objective:   Physical Exam  Constitutional: He is oriented to person, place, and time. He appears well-developed.  HENT:  Head: Normocephalic.  Right Ear: External ear normal.  Left Ear: External ear normal.  Eyes: Conjunctivae and EOM are normal.  Neck: Normal range of motion.  Cardiovascular: Normal rate and normal heart sounds.   Irregular rhythm with controlled ventricular response  Pulmonary/Chest: Breath sounds normal.  Rare basilar crackles O2 saturation 98%  Abdominal: Bowel sounds are normal.  Musculoskeletal: Normal range of motion. He exhibits no edema or tenderness.  Plus 1 pedal edema (early-morning office visit)  Neurological: He is alert and oriented to person, place, and time.  Psychiatric: He has a normal mood and affect. His behavior is normal.          Assessment & Plan:   Pedal edema.  Will intensify salt restricted diet.  Will continue to elevate.  Will consider TED hose.  As a last resort will consider low-dose furosemide 3 times weekly as needed Hypertension, stable Permanent atrial fibrillation

## 2016-01-27 NOTE — Progress Notes (Signed)
Pre visit review using our clinic review tool, if applicable. No additional management support is needed unless otherwise documented below in the visit note. 

## 2016-01-31 ENCOUNTER — Ambulatory Visit (INDEPENDENT_AMBULATORY_CARE_PROVIDER_SITE_OTHER): Payer: Medicare Other | Admitting: General Practice

## 2016-01-31 DIAGNOSIS — Z5181 Encounter for therapeutic drug level monitoring: Secondary | ICD-10-CM | POA: Diagnosis not present

## 2016-01-31 LAB — POCT INR: INR: 2.2

## 2016-01-31 NOTE — Progress Notes (Signed)
Pre visit review using our clinic review tool, if applicable. No additional management support is needed unless otherwise documented below in the visit note. 

## 2016-02-02 ENCOUNTER — Ambulatory Visit: Payer: Medicare Other | Admitting: Podiatry

## 2016-02-08 ENCOUNTER — Encounter: Payer: Self-pay | Admitting: Internal Medicine

## 2016-02-08 DIAGNOSIS — M1711 Unilateral primary osteoarthritis, right knee: Secondary | ICD-10-CM | POA: Diagnosis not present

## 2016-02-08 DIAGNOSIS — M25561 Pain in right knee: Secondary | ICD-10-CM | POA: Diagnosis not present

## 2016-02-10 ENCOUNTER — Ambulatory Visit (INDEPENDENT_AMBULATORY_CARE_PROVIDER_SITE_OTHER): Payer: Medicare Other | Admitting: Podiatry

## 2016-02-10 ENCOUNTER — Encounter: Payer: Self-pay | Admitting: Internal Medicine

## 2016-02-10 ENCOUNTER — Encounter: Payer: Self-pay | Admitting: Podiatry

## 2016-02-10 DIAGNOSIS — B351 Tinea unguium: Secondary | ICD-10-CM

## 2016-02-10 DIAGNOSIS — M79673 Pain in unspecified foot: Secondary | ICD-10-CM | POA: Diagnosis not present

## 2016-02-10 DIAGNOSIS — M79609 Pain in unspecified limb: Principal | ICD-10-CM

## 2016-02-10 NOTE — Progress Notes (Signed)
Patient ID: MACLEAN WAS, male   DOB: Mar 10, 1928, 83 y.o.   MRN: [id number] Complaint:  Visit Type: Patient returns to my office for continued preventative foot care services. Complaint: Patient states" my nails have grown long and thick and become painful to walk and wear shoes" . The patient presents for preventative foot care services. No changes to ROS  Podiatric Exam: Vascular: dorsalis pedis and posterior tibial pulses are palpable bilateral. Capillary return is immediate. Temperature gradient is WNL. Skin turgor WNL  Sensorium: Normal Semmes Weinstein monofilament test. Normal tactile sensation bilaterally. Nail Exam: Pt has thick disfigured discolored nails with subungual debris noted bilateral entire nail hallux through fifth toenails Ulcer Exam: There is no evidence of ulcer or pre-ulcerative changes or infection. Orthopedic Exam: Muscle tone and strength are WNL. No limitations in general ROM. No crepitus or effusions noted. Foot type and digits show no abnormalities. Bony prominences are unremarkable. Skin: No Porokeratosis. No infection or ulcers  Diagnosis:  Onychomycosis, , Pain in right toe, pain in left toes  Treatment & Plan Procedures and Treatment: Consent by patient was obtained for treatment procedures. The patient understood the discussion of treatment and procedures well. All questions were answered thoroughly reviewed. Debridement of mycotic and hypertrophic toenails, 1 through 5 bilateral and clearing of subungual debris. No ulceration, no infection noted.  Return Visit-Office Procedure: Patient instructed to return to the office for a follow up visit 3 months for continued evaluation and treatment.    Gardiner Barefoot DPM

## 2016-03-06 ENCOUNTER — Ambulatory Visit (INDEPENDENT_AMBULATORY_CARE_PROVIDER_SITE_OTHER): Payer: Medicare Other | Admitting: General Practice

## 2016-03-06 ENCOUNTER — Other Ambulatory Visit: Payer: Self-pay | Admitting: General Practice

## 2016-03-06 DIAGNOSIS — Z5181 Encounter for therapeutic drug level monitoring: Secondary | ICD-10-CM | POA: Diagnosis not present

## 2016-03-06 LAB — POCT INR: INR: 1.8

## 2016-03-06 MED ORDER — METOPROLOL TARTRATE 25 MG PO TABS: 25.0000 mg | ORAL_TABLET | Freq: Two times a day (BID) | ORAL | Status: DC

## 2016-03-06 NOTE — Progress Notes (Signed)
Pre visit review using our clinic review tool, if applicable. No additional management support is needed unless otherwise documented below in the visit note. 

## 2016-04-10 ENCOUNTER — Ambulatory Visit (INDEPENDENT_AMBULATORY_CARE_PROVIDER_SITE_OTHER): Payer: Medicare Other | Admitting: General Practice

## 2016-04-10 DIAGNOSIS — Z5181 Encounter for therapeutic drug level monitoring: Secondary | ICD-10-CM

## 2016-04-10 LAB — POCT INR: INR: 2.6

## 2016-04-10 NOTE — Progress Notes (Signed)
Pre visit review using our clinic review tool, if applicable. No additional management support is needed unless otherwise documented below in the visit note. 

## 2016-04-24 ENCOUNTER — Ambulatory Visit (INDEPENDENT_AMBULATORY_CARE_PROVIDER_SITE_OTHER): Payer: Medicare Other | Admitting: Internal Medicine

## 2016-04-24 ENCOUNTER — Encounter: Payer: Self-pay | Admitting: Internal Medicine

## 2016-04-24 VITALS — BP 94/70 | HR 102 | Temp 97.7°F | Ht 71.0 in | Wt 238.0 lb

## 2016-04-24 DIAGNOSIS — R7302 Impaired glucose tolerance (oral): Secondary | ICD-10-CM | POA: Diagnosis not present

## 2016-04-24 DIAGNOSIS — Z7901 Long term (current) use of anticoagulants: Secondary | ICD-10-CM

## 2016-04-24 DIAGNOSIS — I481 Persistent atrial fibrillation: Secondary | ICD-10-CM | POA: Diagnosis not present

## 2016-04-24 DIAGNOSIS — I10 Essential (primary) hypertension: Secondary | ICD-10-CM

## 2016-04-24 DIAGNOSIS — E785 Hyperlipidemia, unspecified: Secondary | ICD-10-CM

## 2016-04-24 DIAGNOSIS — R609 Edema, unspecified: Secondary | ICD-10-CM

## 2016-04-24 DIAGNOSIS — Z0000 Encounter for general adult medical examination without abnormal findings: Secondary | ICD-10-CM

## 2016-04-24 DIAGNOSIS — I4819 Other persistent atrial fibrillation: Secondary | ICD-10-CM

## 2016-04-24 NOTE — Progress Notes (Signed)
Subjective:    Patient ID: ATO CRONEN, male    DOB: Mar 03, 1928, 82 y.o.   MRN: [id number]  HPI   Wt Readings from Last 3 Encounters:  04/24/16 238 lb (108 kg)  01/27/16 241 lb (109.3 kg)  11/18/15 241 lb 12.8 oz (109.7 kg)    Subjective:    Patient ID: [name], male    DOB: [birth year], 82 y.o.   MRN: [id number]  HPI  CC: cpx - donig well.  History of Present Illness:   82  -year-old patient who is seen today for a comprehensive evaluation.  He is followed closely cardiology due to chronic atrial fibrillation. He is on warfarin anticoagulation. He has a history of hypertension, mild, dyslipidemia, and exogenous obesity. He has a history of colonic polyps and a family history of colon cancer. His cardiopulmonary status has been stable.  He is scheduled to see cardiology in September Wt Readings from Last 3 Encounters:  04/24/16 238 lb (108 kg)  01/27/16 241 lb (109.3 kg)  11/18/15 241 lb 12.8 oz (109.7 kg)   Here for Medicare AWV:   1. Risk factors based on Past M, S, F history: risk factors include a family history of prostate and colon cancer.  2. Physical Activities: fairly sedentary, but no exercise restrictions  3. Depression/mood: no history of depression or mood disorder  4. Hearing: no hearing deficits  5. ADL's: completely independent in all aspects of daily living  6. Fall Risk: low  7. Home Safety: no problems identified  8. Height, weight, &visual acuity: No change in visual acuity.  Last eye examination was about 3  year ago  9. Counseling: heart healthy diet calorie restriction and more exercise. All encouraged  10. Labs ordered based on risk factors: laboratory profile, including lipid panel will be reviewed. PSA test in discuss and it was elected to defer  11. Referral Coordination-  follow up cardiology 12. Care Plan- heart healthy diet restricted salt and weight loss. All encouraged  13. Cognitive Assessment- alert and oriented, with normal  affect. Several family members have early dementia  97.  Preventive services will include annual health examinations with screening lab.  No further screening colonoscopies.  Appropriate.  Stool hematest negative.  Today will continue biannual cardiology follow-up. Patient was provided with a written and personalized care plan 15.  Provider list includes primary care cardiology and ophthalmology  Preventive Screening-Counseling & Management  Alcohol-Tobacco  Smoking Status: never   Allergies:  1) ! Codeine  Past History:   Past Medical History:  ATRIAL FIBRILLATION (ICD-427.31)  ANTICOAGULATION THERAPY (ICD-V58.61)  PALPITATIONS (ICD-785.1)  LEFT VENTRICULAR FUNCTION, DECREASED (ICD-429.2)  HYPERTENSION, UNSPECIFIED (ICD-401.9)  HYPERLIPIDEMIA (ICD-272.4)  OBESITY (ICD-278.00)  ENCOUNTER FOR THERAPEUTIC DRUG MONITORING (ICD-V58.83)  SPECIAL SCREENING MALIGNANT NEOPLASM OF PROSTATE (ICD-V76.44)  FAMILY HISTORY DIABETES 1ST DEGREE RELATIVE (ICD-V18.0)  FAMILY HISTORY OF COLON CA 1ST DEGREE RELATIVE <60 (ICD-V16.0)  DIVERTICULITIS, HX OF (ICD-V12.79)  COLONIC POLYPS, HX OF (ICD-V12.72)  COLONIC POLYPS, HX OF (ICD-V12.72)   Past Surgical History:  Colonoscopy December 2005  Anal Fistula Repair  Arthroscopic R Knee  Tonsillectomy  negative ETT 1991  Cataract extraction 2004  status post balloon valvuloplasty at [facility] in 1996  2-D echocardiogram November 2009, 2011   Family History:  Reviewed history from 03/13/2008 and no changes required.  Family History of Colon CA 1st degree relative <60  Family History Diabetes 1st degree relative  Family History of Prostate CA 1st degree relative <50  Family History  of Cardiovascular disorder  father died age 60 of a myocardial infarction  mother died age 63, colon cancer  Two brothers 64 sisters  Positive for coronary disease. Two siblings, status post stenting to siblings with heart disease. One. Status post pacemaker insertion. One  brother with prostate cancer, status post RT   Social History:  Reviewed history from 03/17/2009 and no changes required.  Retired  Never Smoked  Alcohol use-no  Married  Regular exercise-yes: [employer] 4 times/week   Wt Readings from Last 3 Encounters:  04/14/11 245 lb (111.131 kg)  10/10/10 240 lb (108.863 kg)  04/11/10 240 lb (108.863 kg)    Past Medical History  Diagnosis Date  . Arrhythmia   . Drug therapy     Anticoagulation Therapy  . Palpitation   . Decreased left ventricular function   . Hypertension   . Hyperlipidemia   . Obesity   . Neoplasm of prostate     special screening malignant   . Diverticulitis   . History of colonic polyps   . FHx: colon cancer   . Family history of diabetes insipidus        Review of Systems  Constitutional: Negative for fever, chills, activity change, appetite change and fatigue.  HENT: Negative for hearing loss, ear pain, congestion, rhinorrhea, sneezing, mouth sores, trouble swallowing, neck pain, neck stiffness, dental problem, voice change, sinus pressure and tinnitus.   Eyes: Negative for photophobia, pain, redness and visual disturbance.  Respiratory: Negative for apnea, cough, choking, chest tightness, shortness of breath and wheezing.   Cardiovascular: Negative for chest pain, palpitations and leg swelling.  Gastrointestinal: Negative for nausea, vomiting, abdominal pain, diarrhea, constipation, blood in stool, abdominal distention, anal bleeding and rectal pain.  Genitourinary: Negative for dysuria, urgency, frequency, hematuria, flank pain, decreased urine volume, discharge, penile swelling, scrotal swelling, difficulty urinating, genital sores and testicular pain.  Musculoskeletal: Negative for myalgias, back pain, joint swelling, arthralgias and gait problem.  Skin: Negative for color change, rash and wound.  Neurological: Negative for dizziness, tremors, seizures, syncope, facial asymmetry, speech difficulty, weakness,  light-headedness, numbness and headaches.  Hematological: Negative for adenopathy. Does not bruise/bleed easily.  Psychiatric/Behavioral: Negative for suicidal ideas, hallucinations, behavioral problems, confusion, sleep disturbance, self-injury, dysphoric mood, decreased concentration and agitation. The patient is not nervous/anxious.        Objective:   Physical Exam  Constitutional: He appears well-developed and well-nourished.  HENT:  Head: Normocephalic and atraumatic.  Right Ear: External ear normal.  Left Ear: External ear normal.  Nose: Nose normal.  Mouth/Throat: Oropharynx is clear and moist.  Eyes: Conjunctivae and EOM are normal. Pupils are equal, round, and reactive to light. No scleral icterus.  Neck: Normal range of motion. Neck supple. No JVD present. No thyromegaly present.  Cardiovascular: Normal rate, normal heart sounds and intact distal pulses.  Exam reveals no gallop and no friction rub.   No murmur heard.      Irregular rhythm with a controlled ventricular response  Pulmonary/Chest: Effort normal and breath sounds normal. He exhibits no tenderness.  Abdominal: Soft. Bowel sounds are normal. He exhibits no distension and no mass. There is no tenderness.  Genitourinary: Rectum normal, prostate normal and penis normal. Guaiac negative stool. No penile tenderness.  Musculoskeletal: Normal range of motion. He exhibits edema. He exhibits no tenderness.       +1 lower extremity edema.  Lymphadenopathy:    He has no cervical adenopathy.  Neurological: He is alert. He has normal reflexes. No  cranial nerve deficit. Coordination normal.  Skin: Skin is warm and dry. No rash noted.  Psychiatric: He has a normal mood and affect. His behavior is normal.          Assessment & Plan:   Annual health assessment Hypertension well controlled Chronic atrial fibrillation Chronic Coumadin anticoagulation Exogenous obesity Impaired glucose tolerance  Laboratory data  including prothrombin time will be reviewed Followup cardiology  Return here 6 months    Review of Systems     see above Objective:   Physical Exam  HENT:  Wax right canal  Cardiovascular:  Dorsalis pedis pulses intact. Posterior tibial pulses not easily palpable  Pulmonary/Chest:  Few rales right base  Genitourinary: Rectal exam shows guaiac negative stool.  Neurological:  Decreased vibratory sensation and monofilament testing of the feet  Skin:  Onychomycotic nail changes          Assessment & Plan:   Preventive health examination Hypertension well controlled Atrial fibrillation. Obesity.  Weight loss encouraged Chronic Coumadin anticoagulation    We'll continue present regimen Cardiology followup Recheck 6 months   Nyoka Cowden, MD

## 2016-04-24 NOTE — Progress Notes (Signed)
Pre visit review using our clinic review tool, if applicable. No additional management support is needed unless otherwise documented below in the visit note. 

## 2016-04-24 NOTE — Patient Instructions (Signed)
Limit your sodium (Salt) intake    It is important that you exercise regularly, at least 20 minutes 3 to 4 times per week.  If you develop chest pain or shortness of breath seek  medical attention.  You need to lose weight.  Consider a lower calorie diet and regular exercise.  Cardiology follow-up as scheduled  Suggest an annual eye examination  Return in 6 months for follow-up

## 2016-05-10 DIAGNOSIS — M1711 Unilateral primary osteoarthritis, right knee: Secondary | ICD-10-CM | POA: Diagnosis not present

## 2016-05-16 ENCOUNTER — Encounter: Payer: Self-pay | Admitting: Internal Medicine

## 2016-05-22 ENCOUNTER — Ambulatory Visit (INDEPENDENT_AMBULATORY_CARE_PROVIDER_SITE_OTHER): Payer: Medicare Other | Admitting: General Practice

## 2016-05-22 DIAGNOSIS — Z5181 Encounter for therapeutic drug level monitoring: Secondary | ICD-10-CM

## 2016-05-22 LAB — POCT INR: INR: 3

## 2016-05-25 ENCOUNTER — Ambulatory Visit: Payer: Medicare Other | Admitting: Podiatry

## 2016-06-16 ENCOUNTER — Encounter: Payer: Self-pay | Admitting: Cardiovascular Disease

## 2016-06-16 ENCOUNTER — Encounter: Payer: Self-pay | Admitting: Internal Medicine

## 2016-06-16 ENCOUNTER — Ambulatory Visit (INDEPENDENT_AMBULATORY_CARE_PROVIDER_SITE_OTHER): Payer: Medicare Other | Admitting: Cardiovascular Disease

## 2016-06-16 VITALS — BP 118/84 | HR 106 | Ht 72.0 in | Wt 237.2 lb

## 2016-06-16 DIAGNOSIS — I481 Persistent atrial fibrillation: Secondary | ICD-10-CM

## 2016-06-16 DIAGNOSIS — E785 Hyperlipidemia, unspecified: Secondary | ICD-10-CM

## 2016-06-16 DIAGNOSIS — Z7901 Long term (current) use of anticoagulants: Secondary | ICD-10-CM

## 2016-06-16 DIAGNOSIS — R609 Edema, unspecified: Secondary | ICD-10-CM

## 2016-06-16 DIAGNOSIS — I4819 Other persistent atrial fibrillation: Secondary | ICD-10-CM

## 2016-06-16 DIAGNOSIS — I482 Chronic atrial fibrillation: Secondary | ICD-10-CM

## 2016-06-16 DIAGNOSIS — I10 Essential (primary) hypertension: Secondary | ICD-10-CM | POA: Diagnosis not present

## 2016-06-16 DIAGNOSIS — I4821 Permanent atrial fibrillation: Secondary | ICD-10-CM

## 2016-06-16 LAB — CBC WITH DIFFERENTIAL/PLATELET
Basophils Absolute: 88 cells/uL (ref 0–200)
Basophils Relative: 1 %
Eosinophils Absolute: 88 cells/uL (ref 15–500)
Eosinophils Relative: 1 %
HCT: 51.8 % — ABNORMAL HIGH (ref 38.5–50.0)
Hemoglobin: 17.5 g/dL — ABNORMAL HIGH (ref 13.2–17.1)
Lymphocytes Relative: 24 %
Lymphs Abs: 2112 cells/uL (ref 850–3900)
MCH: 32.2 pg (ref 27.0–33.0)
MCHC: 33.8 g/dL (ref 32.0–36.0)
MCV: 95.2 fL (ref 80.0–100.0)
MPV: 11 fL (ref 7.5–12.5)
Monocytes Absolute: 704 cells/uL (ref 200–950)
Monocytes Relative: 8 %
Neutro Abs: 5808 cells/uL (ref 1500–7800)
Neutrophils Relative %: 66 %
Platelets: 203 10*3/uL (ref 140–400)
RBC: 5.44 MIL/uL (ref 4.20–5.80)
RDW: 13.5 % (ref 11.0–15.0)
WBC: 8.8 10*3/uL (ref 3.8–10.8)

## 2016-06-16 LAB — COMPREHENSIVE METABOLIC PANEL
ALT: 17 U/L (ref 9–46)
AST: 19 U/L (ref 10–35)
Albumin: 4.1 g/dL (ref 3.6–5.1)
Alkaline Phosphatase: 69 U/L (ref 40–115)
BUN: 17 mg/dL (ref 7–25)
CO2: 26 mmol/L (ref 20–31)
Calcium: 9.1 mg/dL (ref 8.6–10.3)
Chloride: 105 mmol/L (ref 98–110)
Creat: 1.25 mg/dL — ABNORMAL HIGH (ref 0.70–1.11)
Glucose, Bld: 100 mg/dL — ABNORMAL HIGH (ref 65–99)
Potassium: 4.8 mmol/L (ref 3.5–5.3)
Sodium: 141 mmol/L (ref 135–146)
Total Bilirubin: 0.8 mg/dL (ref 0.2–1.2)
Total Protein: 6.5 g/dL (ref 6.1–8.1)

## 2016-06-16 LAB — LIPID PANEL
Cholesterol: 210 mg/dL — ABNORMAL HIGH (ref 125–200)
HDL: 42 mg/dL (ref >=40)
LDL Cholesterol: 125 mg/dL (ref <130)
Total CHOL/HDL Ratio: 5 Ratio (ref <=5.0)
Triglycerides: 215 mg/dL — ABNORMAL HIGH (ref <150)
VLDL: 43 mg/dL — ABNORMAL HIGH (ref <30)

## 2016-06-16 LAB — MAGNESIUM: Magnesium: 2.1 mg/dL (ref 1.5–2.5)

## 2016-06-16 LAB — TSH: TSH: 2.48 mIU/L (ref 0.40–4.50)

## 2016-06-16 MED ORDER — HYDROCHLOROTHIAZIDE 12.5 MG PO CAPS: 12.5000 mg | ORAL_CAPSULE | Freq: Every day | ORAL | 5 refills | Status: DC | PRN

## 2016-06-16 MED ORDER — METOPROLOL TARTRATE 25 MG PO TABS: ORAL_TABLET | ORAL | 1 refills | Status: DC

## 2016-06-16 NOTE — Patient Instructions (Signed)
Medication Instructions:  Your physician has recommended you make the following change in your medication: 1) INCREASE Metoprolol to 50mg  in the am and 25mg  in the pm 2) START HCTZ 12.5mg  daily as needed for swelling   Labwork: Cmet, Cbc, Tsh, Mag, Lipid today  Testing/Procedures: None ordered  Follow-Up: Your physician wants you to follow-up in: 6 months with Dr.Kelly You will receive a reminder letter in the mail two months in advance. If you don't receive a letter, please call our office to schedule the follow-up appointment.   Any Other Special Instructions Will Be Listed Below (If Applicable).     If you need a refill on your cardiac medications before your next appointment, please call your pharmacy.

## 2016-06-18 NOTE — Progress Notes (Signed)
Primary MD: Dr. Bluford Kaufmann  PATIENT PROFILE: Glen [name] is a 82 y.o. male who is a former patient of Dr. Mare Ferrari who presents to the office toay to establish cardiology care with me.   HPI:  Glen [name] has a history of permanent AF, hypertension and hyperlipidemia. His AF has always been treated with rate control and he neveer had an attempt of cardioversion. He is on coumadin anticoagulation with a cha2ds2vasc score of 3. In the past he was treated with diltiazem and atenolol. He had gingival disease and was taken off diltiazem. Most recently he has been on metoprolol 50 mg bid for rate control but had reduced this to 25 mg bid since he felt that it had contributed to edema  He denies any awareness of sleep apnea. He does not exercise due to knee issues and gets injections into his kness intermittently. He last saw Dr. Mare Ferrari in 11/2015 and complained that his feet :were puffy on the bottom." He presents for evaluation.   Past Medical History:  Diagnosis Date  . Atrial fibrillation ([place])   . Diverticulitis   . Drug therapy    Anticoagulation Therapy  . Family history of diabetes insipidus   . FHx: colon cancer   . History of colonic polyps   . Hx of echocardiogram    a. Echo 5/11:  EF 55-60%, mild dilated ascending aorta, mod LAE  . Hyperlipidemia   . Hypertension   . Neoplasm of prostate    special screening malignant   . Obesity   . Palpitation     Past Surgical History:  Procedure Laterality Date  . 2-D Echocardiogram  November 2009, 2011  . Anal Fistula Repair    . CARDIAC VALVE SURGERY  1996   status post balloon valvuloplasty at [facility]  . CATARACT EXTRACTION  2004  . COLONOSCOPY  December 2005  . ETT  1991   Negative  . KNEE ARTHROSCOPY     Right  . TONSILLECTOMY      Allergies  Allergen Reactions  . Codeine Other (See Comments)    constpation  . Diltiazem     Gingival hyperplasia    Current Outpatient Prescriptions  Medication Sig  Dispense Refill  . acetaminophen (TYLENOL) 500 MG tablet Take 1,000 mg by mouth every 6 (six) hours as needed for pain.     . metoprolol tartrate (LOPRESSOR) 25 MG tablet Take 24m (2 tablets ) in the a.m.and 245m(1 tablet) in the pm 270 tablet 1  . Probiotic Product (PROBIOTIC DAILY PO) Take 1 capsule by mouth daily as needed (supplement).     . warfarin (COUMADIN) 5 MG tablet Take as directed by anticoagulation clinic 90 tablet 3  . hydrochlorothiazide (MICROZIDE) 12.5 MG capsule Take 1 capsule (12.5 mg total) by mouth daily as needed. For swelling 30 capsule 5   No current facility-administered medications for this visit.     Social History   Social History  . Marital status: Married    Spouse name: N/A  . Number of children: N/A  . Years of education: N/A   Occupational History  . Retired Retired   Social History Main Topics  . Smoking status: Never Smoker  . Smokeless tobacco: Never Used  . Alcohol use No  . Drug use: Unknown  . Sexual activity: Not on file   Other Topics Concern  . Not on file   Social History Narrative   Regular exercise: Yes: [employer] 4 times/week  Family History  Problem Relation Age of Onset  . Heart attack Father   . Colon cancer    . Diabetes    . Prostate cancer Brother   . Heart disease    . Coronary artery disease      Two siblings, status post stenting to siblings with heart disease. One. Status post pacemaker insertion.  . Stroke Brother     ROS General: Negative; No fevers, chills, or night sweats HEENT: Negative; No changes in vision or hearing, sinus congestion, difficulty swallowing Pulmonary: Negative; No cough, wheezing, shortness of breath, hemoptysis Cardiovascular:  See HPI; No chest pain, presyncope, syncope, palpitations, edema GI: Negative; No nausea, vomiting, diarrhea, or abdominal pain GU: Negative; No dysuria, hematuria, or difficulty voiding Musculoskeletal: Negative; no myalgias, joint pain, or  weakness Hematologic/Oncologic: Negative; no easy bruising, bleeding Endocrine: Negative; no heat/cold intolerance; no diabetes Neuro: Negative; no changes in balance, headaches Skin: Negative; No rashes or skin lesions Psychiatric: Negative; No behavioral problems, depression Sleep: Negative; No daytime sleepiness, hypersomnolence, bruxism, restless legs, hypnogagnic hallucinations Other comprehensive 14 point system review is negative   Physical Exam BP 118/84 (BP Location: Left Arm, Patient Position: Sitting, Cuff Size: Normal)   Pulse (!) 106   Ht 6' (1.829 m)   Wt 237 lb 3.2 oz (107.6 kg)   BMI 32.17 kg/m   Wt Readings from Last 3 Encounters:  06/16/16 237 lb 3.2 oz (107.6 kg)  04/24/16 238 lb (108 kg)  01/27/16 241 lb (109.3 kg)   General: Alert, oriented, no distress.  Skin: normal turgor, no rashes, warm and dry HEENT: Normocephalic, atraumatic. Pupils equal round and reactive to light; sclera anicteric; extraocular muscles intact; Fundi without exeudates or hemorrhages.  Nose without nasal septal hypertrophy Mouth/Parynx benign; Mallinpatti scale Neck: No JVD, no carotid bruits; normal carotid upstroke Lungs: clear to ausculatation and percussion; no wheezing or rales Chest wall: without tenderness to palpitation Heart: PMI not displaced, irregularly irregular, s1 s2 normal, 1/6 systolic murmur, no diastolic murmur, no rubs, gallops, thrills, or heaves Abdomen: soft, nontender; no hepatosplenomehaly, BS+; abdominal aorta nontender and not dilated by palpation. Back: no CVA tenderness Pulses 2+ Musculoskeletal: full range of motion, normal strength, no joint deformities Extremities: no clubbing cyanosis or edema, Homan's sign negative  Neurologic: grossly nonfocal; Cranial nerves grossly wnl Psychologic: Normal mood and affect   ECG (independently read by me): AF at 106 without significant STT changes.  LABS:  BMP Latest Ref Rng & Units 06/16/2016 11/18/2015  04/19/2015  Glucose 65 - 99 mg/dL 100(H) 130(H) 87  BUN 7 - 25 mg/dL 17 15 16   Creatinine 0.70 - 1.11 mg/dL 1.25(H) 1.00 1.00  Sodium 135 - 146 mmol/L 141 137 140  Potassium 3.5 - 5.3 mmol/L 4.8 4.4 4.2  Chloride 98 - 110 mmol/L 105 105 103  CO2 20 - 31 mmol/L 26 25 28   Calcium 8.6 - 10.3 mg/dL 9.1 9.4 9.1     Hepatic Function Latest Ref Rng & Units 06/16/2016 04/19/2015 04/17/2014  Total Protein 6.1 - 8.1 g/dL 6.5 6.8 6.9  Albumin 3.6 - 5.1 g/dL 4.1 4.0 4.0  AST 10 - 35 U/L 19 19 22   ALT 9 - 46 U/L 17 18 18   Alk Phosphatase 40 - 115 U/L 69 73 71  Total Bilirubin 0.2 - 1.2 mg/dL 0.8 0.7 0.7  Bilirubin, Direct 0.0 - 0.3 mg/dL - - -    CBC Latest Ref Rng & Units 06/16/2016 11/18/2015 04/19/2015  WBC 3.8 - 10.8 K/uL  8.8 8.8 8.4  Hemoglobin 13.2 - 17.1 g/dL 17.5(H) 17.0 17.2(H)  Hematocrit 38.5 - 50.0 % 51.8(H) 49.9 50.8  Platelets 140 - 400 K/uL 203 206 197.0   Lab Results  Component Value Date   MCV 95.2 06/16/2016   MCV 94.5 11/18/2015   MCV 95.4 04/19/2015   Lab Results  Component Value Date   TSH 2.48 06/16/2016   No results found for: HGBA1C  MG 2.1  BNP    Component Value Date/Time   BNP 27.4 11/18/2015 1024    ProBNP No results found for: PROBNP   Lipid Panel     Component Value Date/Time   CHOL 210 (H) 06/16/2016 0946   TRIG 215 (H) 06/16/2016 0946   TRIG 150 (H) 09/06/2006 1031   HDL 42 06/16/2016 0946   CHOLHDL 5.0 06/16/2016 0946   VLDL 43 (H) 06/16/2016 0946   LDLCALC 125 06/16/2016 0946   LDLDIRECT 126.0 04/19/2015 1000    RADIOLOGY: No results found.   ASSESSMENT AND PLAN: Glen [name] is an 82 year old [place] male who has a history of hypertension, hyperlidemia and permanent AF on coumadin anticoagulation. I have reviewed Dr. Sherryl Barters prior evaluations. His ventricular rate is increased today. In the past he had been on metoprolol 50 mg bid but had reduced this due to concerns for edema. I do not think that  metoprolol is the contributor  to his complaints. He has agreed to increase metoprolol to 50 mg in the am and continue with the 25 mg pm dose. If rate continues to be increased then we can increase to 50 mg bid or add digoxin for additional rate control.  I reviewed his last echo which was done in 12/2014 and compared this to his prior 2011 study. His EF remains normal at 55 - 60%. LA is moderately dilated from his AF. I am obtaining a complete set of fasting laboratory on his current therapy. I am initiating HCTZ 12.5 mg for his edema. He is tolerating warfarin without bleeding. INR goal 2.0 - 2.5.  I will see him in 6 months for f/u evaluation.   Time spent: 25 minutes.  Troy Sine, MD, [facility] 06/18/2016 8:56 PM

## 2016-06-19 ENCOUNTER — Encounter: Payer: Self-pay | Admitting: Cardiovascular Disease

## 2016-06-19 DIAGNOSIS — I10 Essential (primary) hypertension: Secondary | ICD-10-CM

## 2016-06-26 ENCOUNTER — Ambulatory Visit: Payer: Medicare Other

## 2016-06-26 ENCOUNTER — Ambulatory Visit (INDEPENDENT_AMBULATORY_CARE_PROVIDER_SITE_OTHER): Payer: Medicare Other | Admitting: General Practice

## 2016-06-26 DIAGNOSIS — Z23 Encounter for immunization: Secondary | ICD-10-CM

## 2016-06-26 DIAGNOSIS — Z5181 Encounter for therapeutic drug level monitoring: Secondary | ICD-10-CM | POA: Diagnosis not present

## 2016-06-26 LAB — POCT INR: INR: 3.2

## 2016-06-29 ENCOUNTER — Encounter: Payer: Self-pay | Admitting: Cardiovascular Disease

## 2016-07-04 ENCOUNTER — Telehealth: Payer: Self-pay | Admitting: *Deleted

## 2016-07-04 ENCOUNTER — Encounter: Payer: Self-pay | Admitting: Cardiovascular Disease

## 2016-07-04 DIAGNOSIS — Z79899 Other long term (current) drug therapy: Secondary | ICD-10-CM

## 2016-07-04 NOTE — Telephone Encounter (Signed)
Lab work order for pt to get done in 2 weeks. Lab slip mail to pt

## 2016-07-04 NOTE — Telephone Encounter (Signed)
-----   Message from Troy Sine, MD sent at 07/03/2016  1:52 PM EDT ----- Labs ok, Cr inc to 1.25; re-check in 2 weeks since HCTZ was started. TG/VLDL increased; reduce carbs, sweets; add OTC fish oil

## 2016-07-07 ENCOUNTER — Encounter: Payer: Self-pay | Admitting: Cardiovascular Disease

## 2016-07-14 DIAGNOSIS — Z79899 Other long term (current) drug therapy: Secondary | ICD-10-CM | POA: Diagnosis not present

## 2016-07-14 LAB — COMPREHENSIVE METABOLIC PANEL
ALT: 16 U/L (ref 9–46)
AST: 19 U/L (ref 10–35)
Albumin: 4 g/dL (ref 3.6–5.1)
Alkaline Phosphatase: 67 U/L (ref 40–115)
BUN: 19 mg/dL (ref 7–25)
CO2: 25 mmol/L (ref 20–31)
Calcium: 9.1 mg/dL (ref 8.6–10.3)
Chloride: 104 mmol/L (ref 98–110)
Creat: 1.25 mg/dL — ABNORMAL HIGH (ref 0.70–1.11)
Glucose, Bld: 101 mg/dL — ABNORMAL HIGH (ref 65–99)
Potassium: 4.8 mmol/L (ref 3.5–5.3)
Sodium: 138 mmol/L (ref 135–146)
Total Bilirubin: 0.8 mg/dL (ref 0.2–1.2)
Total Protein: 6.5 g/dL (ref 6.1–8.1)

## 2016-07-23 ENCOUNTER — Encounter: Payer: Self-pay | Admitting: Cardiovascular Disease

## 2016-07-24 ENCOUNTER — Ambulatory Visit: Payer: Medicare Other

## 2016-07-26 ENCOUNTER — Ambulatory Visit: Payer: Medicare Other | Admitting: *Deleted

## 2016-07-26 ENCOUNTER — Encounter: Payer: Self-pay | Admitting: Cardiovascular Disease

## 2016-07-31 ENCOUNTER — Ambulatory Visit: Payer: Self-pay | Admitting: Internal Medicine

## 2016-07-31 ENCOUNTER — Ambulatory Visit (INDEPENDENT_AMBULATORY_CARE_PROVIDER_SITE_OTHER): Payer: Medicare Other | Admitting: General Practice

## 2016-07-31 ENCOUNTER — Other Ambulatory Visit: Payer: Self-pay | Admitting: General Practice

## 2016-07-31 DIAGNOSIS — Z5181 Encounter for therapeutic drug level monitoring: Secondary | ICD-10-CM | POA: Diagnosis not present

## 2016-07-31 LAB — POCT INR: INR: 3.1

## 2016-07-31 MED ORDER — WARFARIN SODIUM 5 MG PO TABS: ORAL_TABLET | ORAL | 1 refills | Status: DC

## 2016-07-31 NOTE — Patient Instructions (Signed)
Pre visit review using our clinic review tool, if applicable. No additional management support is needed unless otherwise documented below in the visit note. 

## 2016-07-31 NOTE — Progress Notes (Signed)
I agree with this plan.

## 2016-08-16 DIAGNOSIS — M1711 Unilateral primary osteoarthritis, right knee: Secondary | ICD-10-CM | POA: Diagnosis not present

## 2016-08-28 ENCOUNTER — Ambulatory Visit (INDEPENDENT_AMBULATORY_CARE_PROVIDER_SITE_OTHER): Payer: Medicare Other | Admitting: General Practice

## 2016-08-28 DIAGNOSIS — Z5181 Encounter for therapeutic drug level monitoring: Secondary | ICD-10-CM

## 2016-08-28 LAB — POCT INR: INR: 2

## 2016-08-28 NOTE — Patient Instructions (Signed)
Pre visit review using our clinic review tool, if applicable. No additional management support is needed unless otherwise documented below in the visit note. 

## 2016-10-09 ENCOUNTER — Ambulatory Visit (INDEPENDENT_AMBULATORY_CARE_PROVIDER_SITE_OTHER): Payer: Medicare Other | Admitting: General Practice

## 2016-10-09 DIAGNOSIS — Z5181 Encounter for therapeutic drug level monitoring: Secondary | ICD-10-CM | POA: Diagnosis not present

## 2016-10-09 LAB — POCT INR: INR: 2.2

## 2016-10-09 NOTE — Patient Instructions (Signed)
Pre visit review using our clinic review tool, if applicable. No additional management support is needed unless otherwise documented below in the visit note. 

## 2016-10-25 ENCOUNTER — Ambulatory Visit (INDEPENDENT_AMBULATORY_CARE_PROVIDER_SITE_OTHER): Payer: Medicare Other | Admitting: Internal Medicine

## 2016-10-25 ENCOUNTER — Encounter: Payer: Self-pay | Admitting: Internal Medicine

## 2016-10-25 VITALS — BP 128/72 | HR 102 | Temp 97.6°F | Ht 72.0 in | Wt 231.4 lb

## 2016-10-25 DIAGNOSIS — I482 Chronic atrial fibrillation, unspecified: Secondary | ICD-10-CM

## 2016-10-25 DIAGNOSIS — E785 Hyperlipidemia, unspecified: Secondary | ICD-10-CM | POA: Diagnosis not present

## 2016-10-25 DIAGNOSIS — R7302 Impaired glucose tolerance (oral): Secondary | ICD-10-CM | POA: Diagnosis not present

## 2016-10-25 DIAGNOSIS — I10 Essential (primary) hypertension: Secondary | ICD-10-CM | POA: Diagnosis not present

## 2016-10-25 MED ORDER — ATENOLOL 50 MG PO TABS: 50.0000 mg | ORAL_TABLET | Freq: Every day | ORAL | 3 refills | Status: DC

## 2016-10-25 NOTE — Progress Notes (Signed)
Subjective:    Patient ID: Glen [name], male    DOB: 07-Feb-1928, 83 y.o.   MRN: [id number]  HPI  BP Readings from Last 3 Encounters:  10/25/16 128/72  06/16/16 118/84  04/24/16 56/68   83 year old patient who has a history of permanent atrial fibrillation.  He has been on chronic anticoagulation with Coumadin as well as rate control.  He was placed on diuretic therapy by cardiology due to peripheral edema.  He took this only briefly due to constipation.  Issues.  He has had no significant edema problem since discontinuation. He has down titrated metoprolol to 25 mg daily.  This was uptitrated to 75 mg daily in divided dosages for rate control. He has been on atenolol in the past, which he wishes to resume.  This apparently was discontinued in the past due to issues with indigestion  Past Medical History:  Diagnosis Date  . Atrial fibrillation ([place])   . Diverticulitis   . Drug therapy    Anticoagulation Therapy  . Family history of diabetes insipidus   . FHx: colon cancer   . History of colonic polyps   . Hx of echocardiogram    a. Echo 5/11:  EF 55-60%, mild dilated ascending aorta, mod LAE  . Hyperlipidemia   . Hypertension   . Neoplasm of prostate    special screening malignant   . Obesity   . Palpitation      Social History   Social History  . Marital status: Married    Spouse name: N/A  . Number of children: N/A  . Years of education: N/A   Occupational History  . Retired Retired   Social History Main Topics  . Smoking status: Never Smoker  . Smokeless tobacco: Never Used  . Alcohol use No  . Drug use: Unknown  . Sexual activity: Not on file   Other Topics Concern  . Not on file   Social History Narrative   Regular exercise: Yes: [employer] 4 times/week    Past Surgical History:  Procedure Laterality Date  . 2-D Echocardiogram  November 2009, 2011  . Anal Fistula Repair    . CARDIAC VALVE SURGERY  1996   status post balloon valvuloplasty at [facility]  .  CATARACT EXTRACTION  2004  . COLONOSCOPY  December 2005  . ETT  1991   Negative  . KNEE ARTHROSCOPY     Right  . TONSILLECTOMY      Family History  Problem Relation Age of Onset  . Heart attack Father   . Colon cancer    . Diabetes    . Prostate cancer Brother   . Heart disease    . Coronary artery disease      Two siblings, status post stenting to siblings with heart disease. One. Status post pacemaker insertion.  . Stroke Brother     Allergies  Allergen Reactions  . Codeine Other (See Comments)    constpation  . Diltiazem     Gingival hyperplasia    Current Outpatient Prescriptions on File Prior to Visit  Medication Sig Dispense Refill  . acetaminophen (TYLENOL) 500 MG tablet Take 1,000 mg by mouth every 6 (six) hours as needed for pain.     . metoprolol tartrate (LOPRESSOR) 25 MG tablet Take 50mg  (2 tablets ) in the a.m.and 25mg  (1 tablet) in the pm 270 tablet 1  . warfarin (COUMADIN) 5 MG tablet Take as directed by anticoagulation clinic 90 tablet 1   No current facility-administered medications  on file prior to visit.     BP 128/72 (BP Location: Right Arm, Patient Position: Sitting, Cuff Size: Normal)   Pulse (!) 102   Temp 97.6 F (36.4 C) (Oral)   Ht 6' (1.829 m)   Wt 231 lb 6.4 oz (105 kg)   SpO2 98%   BMI 31.38 kg/m      Review of Systems  Constitutional: Negative for appetite change, chills, fatigue and fever.  HENT: Negative for congestion, dental problem, ear pain, hearing loss, sore throat, tinnitus, trouble swallowing and voice change.   Eyes: Negative for pain, discharge and visual disturbance.  Respiratory: Negative for cough, chest tightness, wheezing and stridor.   Cardiovascular: Positive for leg swelling. Negative for chest pain and palpitations.  Gastrointestinal: Positive for constipation. Negative for abdominal distention, abdominal pain, blood in stool, diarrhea, nausea and vomiting.  Genitourinary: Negative for difficulty urinating,  discharge, flank pain, genital sores, hematuria and urgency.  Musculoskeletal: Negative for arthralgias, back pain, gait problem, joint swelling, myalgias and neck stiffness.  Skin: Negative for rash.  Neurological: Negative for dizziness, syncope, speech difficulty, weakness, numbness and headaches.  Hematological: Negative for adenopathy. Does not bruise/bleed easily.  Psychiatric/Behavioral: Negative for behavioral problems and dysphoric mood. The patient is not nervous/anxious.        Objective:   Physical Exam  Constitutional: He is oriented to person, place, and time. He appears well-developed.  Blood pressure 110/70 Weight 231  HENT:  Head: Normocephalic.  Right Ear: External ear normal.  Left Ear: External ear normal.  Eyes: Conjunctivae and EOM are normal.  Neck: Normal range of motion.  Cardiovascular: Normal rate and normal heart sounds.   Pulses are regular with a rate of 100  Pulmonary/Chest: He has rales.  Abdominal: Bowel sounds are normal.  Musculoskeletal: Normal range of motion. He exhibits no edema or tenderness.  Neurological: He is alert and oriented to person, place, and time.  Psychiatric: He has a normal mood and affect. His behavior is normal.          Assessment & Plan:   Permanent atrial fibrillation.  Patient needs better rate control.  Will resume atenolol a dose of 50 mg daily Chronic anticoagulation History of edema, stable at present.  Will encourage low-salt diet History of hypertension.  Blood pressure low normal  CPX 6 months  Past Medical History:  Diagnosis Date  . Atrial fibrillation ([place])   . Diverticulitis   . Drug therapy    Anticoagulation Therapy  . Family history of diabetes insipidus   . FHx: colon cancer   . History of colonic polyps   . Hx of echocardiogram    a. Echo 5/11:  EF 55-60%, mild dilated ascending aorta, mod LAE  . Hyperlipidemia   . Hypertension   . Neoplasm of prostate    special screening malignant     . Obesity   . Palpitation      Social History   Social History  . Marital status: Married    Spouse name: N/A  . Number of children: N/A  . Years of education: N/A   Occupational History  . Retired Retired   Social History Main Topics  . Smoking status: Never Smoker  . Smokeless tobacco: Never Used  . Alcohol use No  . Drug use: Unknown  . Sexual activity: Not on file   Other Topics Concern  . Not on file   Social History Narrative   Regular exercise: Yes: [employer] 4 times/week  Past Surgical History:  Procedure Laterality Date  . 2-D Echocardiogram  November 2009, 2011  . Anal Fistula Repair    . CARDIAC VALVE SURGERY  1996   status post balloon valvuloplasty at [facility]  . CATARACT EXTRACTION  2004  . COLONOSCOPY  December 2005  . ETT  1991   Negative  . KNEE ARTHROSCOPY     Right  . TONSILLECTOMY      Family History  Problem Relation Age of Onset  . Heart attack Father   . Colon cancer    . Diabetes    . Prostate cancer Brother   . Heart disease    . Coronary artery disease      Two siblings, status post stenting to siblings with heart disease. One. Status post pacemaker insertion.  . Stroke Brother     Allergies  Allergen Reactions  . Codeine Other (See Comments)    constpation  . Diltiazem     Gingival hyperplasia    Current Outpatient Prescriptions on File Prior to Visit  Medication Sig Dispense Refill  . acetaminophen (TYLENOL) 500 MG tablet Take 1,000 mg by mouth every 6 (six) hours as needed for pain.     . metoprolol tartrate (LOPRESSOR) 25 MG tablet Take 50mg  (2 tablets ) in the a.m.and 25mg  (1 tablet) in the pm 270 tablet 1  . warfarin (COUMADIN) 5 MG tablet Take as directed by anticoagulation clinic 90 tablet 1   No current facility-administered medications on file prior to visit.     BP 128/72 (BP Location: Right Arm, Patient Position: Sitting, Cuff Size: Normal)   Pulse (!) 102   Temp 97.6 F (36.4 C) (Oral)   Ht 6' (1.829 m)    Wt 231 lb 6.4 oz (105 kg)   SpO2 98%   BMI 31.38 kg/m

## 2016-10-25 NOTE — Patient Instructions (Signed)
Limit your sodium (Salt) intake  Discontinue metoprolol  Start atenolol 50 mg once daily  You need to lose weight.  Consider a lower calorie diet and regular exercise.  Return in 3 months for follow-up

## 2016-10-25 NOTE — Progress Notes (Signed)
Pre visit review using our clinic review tool, if applicable. No additional management support is needed unless otherwise documented below in the visit note. 

## 2016-11-13 ENCOUNTER — Ambulatory Visit (INDEPENDENT_AMBULATORY_CARE_PROVIDER_SITE_OTHER): Payer: Medicare Other | Admitting: General Practice

## 2016-11-13 ENCOUNTER — Encounter: Payer: Self-pay | Admitting: Internal Medicine

## 2016-11-13 DIAGNOSIS — Z5181 Encounter for therapeutic drug level monitoring: Secondary | ICD-10-CM

## 2016-11-13 LAB — POCT INR: INR: 1.7

## 2016-11-13 NOTE — Patient Instructions (Signed)
Pre visit review using our clinic review tool, if applicable. No additional management support is needed unless otherwise documented below in the visit note. 

## 2016-12-08 ENCOUNTER — Encounter: Payer: Self-pay | Admitting: Internal Medicine

## 2016-12-11 ENCOUNTER — Ambulatory Visit (INDEPENDENT_AMBULATORY_CARE_PROVIDER_SITE_OTHER): Payer: Medicare Other | Admitting: General Practice

## 2016-12-11 DIAGNOSIS — Z5181 Encounter for therapeutic drug level monitoring: Secondary | ICD-10-CM | POA: Diagnosis not present

## 2016-12-11 LAB — POCT INR: INR: 2.6

## 2016-12-11 NOTE — Patient Instructions (Signed)
Pre visit review using our clinic review tool, if applicable. No additional management support is needed unless otherwise documented below in the visit note. 

## 2016-12-13 ENCOUNTER — Ambulatory Visit (INDEPENDENT_AMBULATORY_CARE_PROVIDER_SITE_OTHER): Payer: Medicare Other | Admitting: Cardiovascular Disease

## 2016-12-13 VITALS — BP 125/83 | HR 104 | Ht 72.0 in | Wt 235.8 lb

## 2016-12-13 DIAGNOSIS — E782 Mixed hyperlipidemia: Secondary | ICD-10-CM

## 2016-12-13 DIAGNOSIS — I10 Essential (primary) hypertension: Secondary | ICD-10-CM | POA: Diagnosis not present

## 2016-12-13 DIAGNOSIS — E669 Obesity, unspecified: Secondary | ICD-10-CM

## 2016-12-13 DIAGNOSIS — I482 Chronic atrial fibrillation, unspecified: Secondary | ICD-10-CM

## 2016-12-13 DIAGNOSIS — Z7901 Long term (current) use of anticoagulants: Secondary | ICD-10-CM

## 2016-12-13 MED ORDER — ATENOLOL 50 MG PO TABS: ORAL_TABLET | ORAL | 3 refills | Status: DC

## 2016-12-13 NOTE — Progress Notes (Signed)
Primary MD: Dr. Bluford Kaufmann  PATIENT PROFILE: Glen [name] is a 83 y.o. male who is a former patient of Dr. Mare Ferrari who established care with me in September 2017.  He presents for six-month follow-up cardiology evaluation.  HPI:  Glen [name] has a history of permanent AF, hypertension and hyperlipidemia. His AF has always been treated with rate control and he neveer had an attempt of cardioversion. He is on coumadin anticoagulation with a cha2ds2vasc score of 3. In the past he was treated with diltiazem and atenolol. He had gingival disease and was taken off diltiazem. Most recently he has been on metoprolol 50 mg bid for rate control but had reduced this to 25 mg bid since he felt that it had contributed to edema  He denies any awareness of sleep apnea. He does not exercise due to knee issues and gets injections into his kness intermittently. He last saw Dr. Mare Ferrari in 11/2015 and complained that his feet :were puffy on the bottom."   When I last saw him, I did not feel that the metoprolol was contributing to his feet discomfort.  Inside last saw him, he was changed back to a atenolol from metoprolol by his primary physician.  He has now been on atenolol 50 mg in the morning, Coumadin anticoagulation, and has been taking over-the-counter fish oil.  He is unaware of any breakthrough atrial fibrillation.  He denies chest pain.  Shortness of breath.  He denies bleeding.  He presents for evaluation  Past Medical History:  Diagnosis Date  . Atrial fibrillation ([place])   . Diverticulitis   . Drug therapy    Anticoagulation Therapy  . Family history of diabetes insipidus   . FHx: colon cancer   . History of colonic polyps   . Hx of echocardiogram    a. Echo 5/11:  EF 55-60%, mild dilated ascending aorta, mod LAE  . Hyperlipidemia   . Hypertension   . Neoplasm of prostate    special screening malignant   . Obesity   . Palpitation     Past Surgical History:  Procedure  Laterality Date  . 2-D Echocardiogram  November 2009, 2011  . Anal Fistula Repair    . CARDIAC VALVE SURGERY  1996   status post balloon valvuloplasty at [facility]  . CATARACT EXTRACTION  2004  . COLONOSCOPY  December 2005  . ETT  1991   Negative  . KNEE ARTHROSCOPY     Right  . TONSILLECTOMY      Allergies  Allergen Reactions  . Codeine Other (See Comments)    constpation  . Diltiazem     Gingival hyperplasia    Current Outpatient Prescriptions  Medication Sig Dispense Refill  . acetaminophen (TYLENOL) 500 MG tablet Take 1,000 mg by mouth every 6 (six) hours as needed for pain.     Marland Kitchen atenolol (TENORMIN) 50 MG tablet Take 1 tablet in the AM and 1/2 tablet in the PM 90 tablet 3  . warfarin (COUMADIN) 5 MG tablet Take as directed by anticoagulation clinic 90 tablet 1   No current facility-administered medications for this visit.     Social History   Social History  . Marital status: Married    Spouse name: N/A  . Number of children: N/A  . Years of education: N/A   Occupational History  . Retired Retired   Social History Main Topics  . Smoking status: Never Smoker  . Smokeless tobacco: Never Used  . Alcohol  use No  . Drug use: Unknown  . Sexual activity: Not on file   Other Topics Concern  . Not on file   Social History Narrative   Regular exercise: Yes: [employer] 4 times/week    Family History  Problem Relation Age of Onset  . Heart attack Father   . Colon cancer    . Diabetes    . Prostate cancer Brother   . Heart disease    . Coronary artery disease      Two siblings, status post stenting to siblings with heart disease. One. Status post pacemaker insertion.  . Stroke Brother     ROS General: Negative; No fevers, chills, or night sweats HEENT: Negative; No changes in vision or hearing, sinus congestion, difficulty swallowing Pulmonary: Negative; No cough, wheezing, shortness of breath, hemoptysis Cardiovascular:  See HPI; No chest pain, presyncope,  syncope, palpitations, edema GI: Negative; No nausea, vomiting, diarrhea, or abdominal pain GU: Negative; No dysuria, hematuria, or difficulty voiding Musculoskeletal: Negative; no myalgias, joint pain, or weakness Hematologic/Oncologic: Negative; no easy bruising, bleeding Endocrine: Negative; no heat/cold intolerance; no diabetes Neuro: Negative; no changes in balance, headaches Skin: Negative; No rashes or skin lesions Psychiatric: Negative; No behavioral problems, depression Sleep: Negative; No daytime sleepiness, hypersomnolence, bruxism, restless legs, hypnogagnic hallucinations Other comprehensive 14 point system review is negative   Physical Exam BP 125/83   Pulse (!) 104   Ht 6' (1.829 m)   Wt 235 lb 12.8 oz (107 kg)   BMI 31.98 kg/m    Repeat blood pressure by me was 114/78 supine and 108/76 standing  Wt Readings from Last 3 Encounters:  12/13/16 235 lb 12.8 oz (107 kg)  10/25/16 231 lb 6.4 oz (105 kg)  06/16/16 237 lb 3.2 oz (107.6 kg)   General: Alert, oriented, no distress.  Skin: normal turgor, no rashes, warm and dry HEENT: Normocephalic, atraumatic. Pupils equal round and reactive to light; sclera anicteric; extraocular muscles intact; Fundi without exeudates or hemorrhages.  Nose without nasal septal hypertrophy Mouth/Parynx benign; Mallinpatti scale 3 Neck: No JVD, no carotid bruits; normal carotid upstroke Lungs: clear to ausculatation and percussion; no wheezing or rales Chest wall: without tenderness to palpitation Heart: PMI not displaced, irregularly irregular, s1 s2 normal, 1/6 systolic murmur, no diastolic murmur, no rubs, gallops, thrills, or heaves Abdomen: soft, nontender; no hepatosplenomehaly, BS+; abdominal aorta nontender and not dilated by palpation. Back: no CVA tenderness Pulses 2+ Musculoskeletal: full range of motion, normal strength, no joint deformities Extremities: no clubbing cyanosis or edema, Homan's sign negative  Neurologic:  grossly nonfocal; Cranial nerves grossly wnl Psychologic: Normal mood and affect  ECG (independently read by me): Atrial fibrillation with ventricular rate at 104 bpm.  QTc interval 433 ms.  September 2017 ECG (independently read by me): AF at 106 without significant STT changes.  LABS:  BMP Latest Ref Rng & Units 07/14/2016 06/16/2016 11/18/2015  Glucose 65 - 99 mg/dL 101(H) 100(H) 130(H)  BUN 7 - 25 mg/dL _0 Creatinine 0.70 - 1.11 mg/dL 1.25(H) 1.25(H) 1.00  Sodium 135 - 146 mmol/L 138 141 137  Potassium 3.5 - 5.3 mmol/L 4.8 4.8 4.4  Chloride 98 - 110 mmol/L 104 105 105  CO2 20 - 31 mmol/L _1 Calcium 8.6 - 10.3 mg/dL 9.1 9.1 9.4     Hepatic Function Latest Ref Rng & Units 07/14/2016 06/16/2016 04/19/2015  Total Protein 6.1 - 8.1 g/dL 6.5 6.5 6.8  Albumin 3.6 - 5.1 g/dL 4.0  4.1 4.0  AST 10 - 35 U/L _0 ALT 9 - 46 U/L _1 Alk Phosphatase 40 - 115 U/L 67 69 73  Total Bilirubin 0.2 - 1.2 mg/dL 0.8 0.8 0.7  Bilirubin, Direct 0.0 - 0.3 mg/dL - - -    CBC Latest Ref Rng & Units 06/16/2016 11/18/2015 04/19/2015  WBC 3.8 - 10.8 K/uL 8.8 8.8 8.4  Hemoglobin 13.2 - 17.1 g/dL 17.5(H) 17.0 17.2(H)  Hematocrit 38.5 - 50.0 % 51.8(H) 49.9 50.8  Platelets 140 - 400 K/uL 203 206 197.0   Lab Results  Component Value Date   MCV 95.2 06/16/2016   MCV 94.5 11/18/2015   MCV 95.4 04/19/2015   Lab Results  Component Value Date   TSH 2.48 06/16/2016   No results found for: HGBA1C  MG 2.1  BNP    Component Value Date/Time   BNP 27.4 11/18/2015 1024    ProBNP No results found for: PROBNP   Lipid Panel     Component Value Date/Time   CHOL 210 (H) 06/16/2016 0946   TRIG 215 (H) 06/16/2016 0946   TRIG 150 (H) 09/06/2006 1031   HDL 42 06/16/2016 0946   CHOLHDL 5.0 06/16/2016 0946   VLDL 43 (H) 06/16/2016 0946   LDLCALC 125 06/16/2016 0946   LDLDIRECT 126.0 04/19/2015 1000    RADIOLOGY: No results found.   IMPRESSION:  1. Chronic atrial fibrillation  ([place])   2. Essential hypertension   3. Mixed hyperlipidemia   4. Anticoagulation adequate   5. Mild obesity     ASSESSMENT AND PLAN: Mr Sciandra is an 83 year old [place] male who has a history of hypertension, hyperlidemia and permanent AF on coumadin anticoagulation. I have reviewed Dr. Sherryl Barters prior evaluations. His last echo which was done in 12/2014 and compared to his prior 2011 study EF remains normal at 55 - 60%. LA is moderately dilated from his AF.His INR goal is to 2.5 on warfarin anticoagulation.  Color rate is increased today and I have recommended titration of atenolol to 50 mg in the morning and 25 mg at night.  We discussed his mixed hyperlipidemia and if persistent he may require initiation of lipid-lowering therapy.  Presently, he is not having any edema and is no longer taking HCTZ.  I suspect his feet.  Sensation may be consistent with a peripheral neuropathy.  He will be following up with his primary M.D. who will be rechecking laboratory.  I will see him in 6 months to a year for follow-up evaluation or sooner if problems arise.    Time spent: 25 minutes.  Troy Sine, MD, [facility] 12/15/2016 11:52 AM

## 2016-12-13 NOTE — Patient Instructions (Addendum)
Your physician has recommended you make the following change in your medication:   1. The atenolol has been changed to 1 tablet in the morning and 1/2 tablet at night.  2.) continue the warfarin.  Your physician recommends that you schedule a follow-up appointment with your PCP.  Your physician wants you to follow-up in: 6 months or sooner if needed. You will receive a reminder letter in the mail two months in advance. If you don't receive a letter, please call our office to schedule the follow-up appointment.  If you need a refill on your cardiac medications before your next appointment, please call your pharmacy.

## 2016-12-15 ENCOUNTER — Encounter: Payer: Self-pay | Admitting: Cardiovascular Disease

## 2016-12-19 ENCOUNTER — Encounter: Payer: Self-pay | Admitting: Cardiovascular Disease

## 2017-01-08 ENCOUNTER — Ambulatory Visit (INDEPENDENT_AMBULATORY_CARE_PROVIDER_SITE_OTHER): Payer: Medicare Other | Admitting: General Practice

## 2017-01-08 DIAGNOSIS — I4891 Unspecified atrial fibrillation: Secondary | ICD-10-CM

## 2017-01-08 DIAGNOSIS — Z5181 Encounter for therapeutic drug level monitoring: Secondary | ICD-10-CM | POA: Diagnosis not present

## 2017-01-08 LAB — POCT INR: INR: 2

## 2017-01-08 NOTE — Patient Instructions (Signed)
Pre visit review using our clinic review tool, if applicable. No additional management support is needed unless otherwise documented below in the visit note. 

## 2017-01-23 ENCOUNTER — Ambulatory Visit (INDEPENDENT_AMBULATORY_CARE_PROVIDER_SITE_OTHER): Payer: Medicare Other | Admitting: Internal Medicine

## 2017-01-23 ENCOUNTER — Encounter: Payer: Self-pay | Admitting: Internal Medicine

## 2017-01-23 VITALS — BP 122/68 | HR 92 | Temp 97.7°F | Ht 72.0 in | Wt 235.4 lb

## 2017-01-23 DIAGNOSIS — I480 Paroxysmal atrial fibrillation: Secondary | ICD-10-CM | POA: Diagnosis not present

## 2017-01-23 DIAGNOSIS — R609 Edema, unspecified: Secondary | ICD-10-CM | POA: Diagnosis not present

## 2017-01-23 DIAGNOSIS — I10 Essential (primary) hypertension: Secondary | ICD-10-CM | POA: Diagnosis not present

## 2017-01-23 MED ORDER — METOPROLOL TARTRATE 50 MG PO TABS: ORAL_TABLET | ORAL | 3 refills | Status: DC

## 2017-01-23 NOTE — Patient Instructions (Addendum)
Limit your sodium (Salt) intake    It is important that you exercise regularly, at least 20 minutes 3 to 4 times per week.  If you develop chest pain or shortness of breath seek  medical attention.  Return in 6 months for follow-up  Food Choices to Help Relieve Diarrhea, Adult When you have diarrhea, the foods you eat and your eating habits are very important. Choosing the right foods and drinks can help:  Relieve diarrhea.  Replace lost fluids and nutrients.  Prevent dehydration. What general guidelines should I follow? Relieving diarrhea   Choose foods with less than 2 g or .07 oz. of fiber per serving.  Limit fats to less than 8 tsp (38 g or 1.34 oz.) a day.  Avoid the following:  Foods and beverages sweetened with high-fructose corn syrup, honey, or sugar alcohols such as xylitol, sorbitol, and mannitol.  Foods that contain a lot of fat or sugar.  Fried, greasy, or spicy foods.  High-fiber grains, breads, and cereals.  Raw fruits and vegetables.  Eat foods that are rich in probiotics. These foods include dairy products such as yogurt and fermented milk products. They help increase healthy bacteria in the stomach and intestines (gastrointestinal tract, or GI tract).  If you have lactose intolerance, avoid dairy products. These may make your diarrhea worse.  Take medicine to help stop diarrhea (antidiarrheal medicine) only as told by your health care provider. Replacing nutrients   Eat small meals or snacks every 3-4 hours.  Eat bland foods, such as white rice, toast, or baked potato, until your diarrhea starts to get better. Gradually reintroduce nutrient-rich foods as tolerated or as told by your health care provider. This includes:  Well-cooked protein foods.  Peeled, seeded, and soft-cooked fruits and vegetables.  Low-fat dairy products.  Take vitamin and mineral supplements as told by your health care provider. Preventing dehydration    Start by sipping  water or a special solution to prevent dehydration (oral rehydration solution, ORS). Urine that is clear or pale yellow means that you are getting enough fluid.  Try to drink at least 8-10 cups of fluid each day to help replace lost fluids.  You may add other liquids in addition to water, such as clear juice or decaffeinated sports drinks, as tolerated or as told by your health care provider.  Avoid drinks with caffeine, such as coffee, tea, or soft drinks.  Avoid alcohol. What foods are recommended? The items listed may not be a complete list. Talk with your health care provider about what dietary choices are best for you. Grains  White rice. White, [place], or pita breads (fresh or toasted), including plain rolls, buns, or bagels. White pasta. Saltine, soda, or graham crackers. Pretzels. Low-fiber cereal. Cooked cereals made with water (such as cornmeal, farina, or cream cereals). Plain muffins. Matzo. Melba toast. Zwieback. Vegetables  Potatoes (without the skin). Most well-cooked and canned vegetables without skins or seeds. Tender lettuce. Fruits  Apple sauce. Fruits canned in juice. Cooked apricots, cherries, grapefruit, peaches, pears, or plums. Fresh bananas and cantaloupe. Meats and other protein foods  Baked or boiled chicken. Eggs. Tofu. Fish. Seafood. Smooth nut butters. Ground or well-cooked tender beef, ham, veal, lamb, pork, or poultry. Dairy  Plain yogurt, kefir, and unsweetened liquid yogurt. Lactose-free milk, buttermilk, skim milk, or soy milk. Low-fat or nonfat hard cheese. Beverages  Water. Low-calorie sports drinks. Fruit juices without pulp. Strained tomato and vegetable juices. Decaffeinated teas. Sugar-free beverages not sweetened with sugar alcohols.  Oral rehydration solutions, if approved by your health care provider. Seasoning and other foods  Bouillon, broth, or soups made from recommended foods. What foods are not recommended? The items listed may not be a  complete list. Talk with your health care provider about what dietary choices are best for you. Grains  Whole grain, whole wheat, bran, or rye breads, rolls, pastas, and crackers. Wild or brown rice. Whole grain or bran cereals. Barley. Oats and oatmeal. Corn tortillas or taco shells. Granola. Popcorn. Vegetables  Raw vegetables. Fried vegetables. Cabbage, broccoli, Brussels sprouts, artichokes, baked beans, beet greens, corn, kale, legumes, peas, sweet potatoes, and yams. Potato skins. Cooked spinach and cabbage. Fruits  Dried fruit, including raisins and dates. Raw fruits. Stewed or dried prunes. Canned fruits with syrup. Meat and other protein foods  Fried or fatty meats. Deli meats. Chunky nut butters. Nuts and seeds. Beans and lentils. Berniece Salines. Hot dogs. Sausage. Dairy  High-fat cheeses. Whole milk, chocolate milk, and beverages made with milk, such as milk shakes. Half-and-half. Cream. sour cream. Ice cream. Beverages  Caffeinated beverages (such as coffee, tea, soda, or energy drinks). Alcoholic beverages. Fruit juices with pulp. Prune juice. Soft drinks sweetened with high-fructose corn syrup or sugar alcohols. High-calorie sports drinks. Fats and oils  Butter. Cream sauces. Margarine. Salad oils. Plain salad dressings. Olives. Avocados. Mayonnaise. Sweets and desserts  Sweet rolls, doughnuts, and sweet breads. Sugar-free desserts sweetened with sugar alcohols such as xylitol and sorbitol. Seasoning and other foods  Honey. Hot sauce. Chili powder. Gravy. Cream-based or milk-based soups. Pancakes and waffles. Summary  When you have diarrhea, the foods you eat and your eating habits are very important.  Make sure you get at least 8-10 cups of fluid each day, or enough to keep your urine clear or pale yellow.  Eat bland foods and gradually reintroduce healthy, nutrient-rich foods as tolerated, or as told by your health care provider.  Avoid high-fiber, fried, greasy, or spicy  foods. This information is not intended to replace advice given to you by your health care provider. Make sure you discuss any questions you have with your health care provider. Document Released: 12/09/2003 Document Revised: 09/15/2016 Document Reviewed: 09/15/2016 Elsevier Interactive Patient Education  2017 [employer].

## 2017-01-23 NOTE — Progress Notes (Signed)
Subjective:    Patient ID: Glen [name], male    DOB: Feb 02, 1928, 83 y.o.   MRN: [id number]  HPI  83 year old patient who presents today with a chief complaint of diarrhea.  He was last seen by me in January and per his request was switched from metoprolol to atenolol which he had taken in the past for rate control.  The patient has had some diarrhea which she attributes to the atenolol and wishes to switch back to metoprolol.  He basically has some loose bowel movements in the morning, but this clears throughout the day.  He has a history of peripheral edema which has been stable Remains on Coumadin anticoagulation  Past Medical History:  Diagnosis Date  . Atrial fibrillation ([place])   . Diverticulitis   . Drug therapy    Anticoagulation Therapy  . Family history of diabetes insipidus   . FHx: colon cancer   . History of colonic polyps   . Hx of echocardiogram    a. Echo 5/11:  EF 55-60%, mild dilated ascending aorta, mod LAE  . Hyperlipidemia   . Hypertension   . Neoplasm of prostate    special screening malignant   . Obesity   . Palpitation      Social History   Social History  . Marital status: Married    Spouse name: N/A  . Number of children: N/A  . Years of education: N/A   Occupational History  . Retired Retired   Social History Main Topics  . Smoking status: Never Smoker  . Smokeless tobacco: Never Used  . Alcohol use No  . Drug use: Unknown  . Sexual activity: Not on file   Other Topics Concern  . Not on file   Social History Narrative   Regular exercise: Yes: [employer] 4 times/week    Past Surgical History:  Procedure Laterality Date  . 2-D Echocardiogram  November 2009, 2011  . Anal Fistula Repair    . CARDIAC VALVE SURGERY  1996   status post balloon valvuloplasty at [facility]  . CATARACT EXTRACTION  2004  . COLONOSCOPY  December 2005  . ETT  1991   Negative  . KNEE ARTHROSCOPY     Right  . TONSILLECTOMY      Family History  Problem Relation  Age of Onset  . Heart attack Father   . Colon cancer    . Diabetes    . Prostate cancer Brother   . Heart disease    . Coronary artery disease      Two siblings, status post stenting to siblings with heart disease. One. Status post pacemaker insertion.  . Stroke Brother     Allergies  Allergen Reactions  . Codeine Other (See Comments)    constpation  . Diltiazem     Gingival hyperplasia    Current Outpatient Prescriptions on File Prior to Visit  Medication Sig Dispense Refill  . acetaminophen (TYLENOL) 500 MG tablet Take 1,000 mg by mouth every 6 (six) hours as needed for pain.     Marland Kitchen atenolol (TENORMIN) 50 MG tablet Take 1 tablet in the AM and 1/2 tablet in the PM 90 tablet 3  . warfarin (COUMADIN) 5 MG tablet Take as directed by anticoagulation clinic 90 tablet 1   No current facility-administered medications on file prior to visit.     BP 122/68 (BP Location: Left Arm, Patient Position: Sitting, Cuff Size: Normal)   Pulse 92   Temp 97.7 F (36.5 C) (Oral)  Ht 6' (1.829 m)   Wt 235 lb 6.4 oz (106.8 kg)   SpO2 97%   BMI 31.93 kg/m     Review of Systems  Constitutional: Negative for appetite change, chills, fatigue and fever.  HENT: Negative for congestion, dental problem, ear pain, hearing loss, sore throat, tinnitus, trouble swallowing and voice change.   Eyes: Negative for pain, discharge and visual disturbance.  Respiratory: Negative for cough, chest tightness, wheezing and stridor.   Cardiovascular: Negative for chest pain, palpitations and leg swelling.  Gastrointestinal: Positive for diarrhea. Negative for abdominal distention, abdominal pain, blood in stool, constipation, nausea and vomiting.  Genitourinary: Negative for difficulty urinating, discharge, flank pain, genital sores, hematuria and urgency.  Musculoskeletal: Negative for arthralgias, back pain, gait problem, joint swelling, myalgias and neck stiffness.  Skin: Negative for rash.  Neurological:  Negative for dizziness, syncope, speech difficulty, weakness, numbness and headaches.  Hematological: Negative for adenopathy. Does not bruise/bleed easily.  Psychiatric/Behavioral: Negative for behavioral problems and dysphoric mood. The patient is not nervous/anxious.        Objective:   Physical Exam  Constitutional: He is oriented to person, place, and time. He appears well-developed.  HENT:  Head: Normocephalic.  Right Ear: External ear normal.  Left Ear: External ear normal.  Eyes: Conjunctivae and EOM are normal.  Neck: Normal range of motion.  Cardiovascular: Normal rate and normal heart sounds.   Irregular rhythm  Pulmonary/Chest: He has rales.  Abdominal: Bowel sounds are normal.  Musculoskeletal: Normal range of motion. He exhibits no edema or tenderness.  Perhaps trace edema  Neurological: He is alert and oriented to person, place, and time.  Psychiatric: He has a normal mood and affect. His behavior is normal.          Assessment & Plan:   Chronic atrial fibrillation. Early a.m. Diarrhea.  Will switch back to metoprolol.  Doubtful beta blocker therapy is a factor with his diarrhea.  Patient will keep a food diary History of peripheral edema, stable Essential hypertension.  Blood pressure low normal today  CPX 6 months  Nyoka Cowden

## 2017-01-23 NOTE — Progress Notes (Signed)
Pre visit review using our clinic review tool, if applicable. No additional management support is needed unless otherwise documented below in the visit note. 

## 2017-02-05 ENCOUNTER — Ambulatory Visit (INDEPENDENT_AMBULATORY_CARE_PROVIDER_SITE_OTHER): Payer: Medicare Other | Admitting: General Practice

## 2017-02-05 DIAGNOSIS — Z5181 Encounter for therapeutic drug level monitoring: Secondary | ICD-10-CM | POA: Diagnosis not present

## 2017-02-05 DIAGNOSIS — I4891 Unspecified atrial fibrillation: Secondary | ICD-10-CM

## 2017-02-05 LAB — POCT INR: INR: 2.7

## 2017-02-05 NOTE — Patient Instructions (Signed)
Pre visit review using our clinic review tool, if applicable. No additional management support is needed unless otherwise documented below in the visit note. 

## 2017-02-15 ENCOUNTER — Other Ambulatory Visit: Payer: Self-pay | Admitting: Internal Medicine

## 2017-02-15 ENCOUNTER — Other Ambulatory Visit: Payer: Self-pay | Admitting: General Practice

## 2017-02-15 MED ORDER — WARFARIN SODIUM 5 MG PO TABS: ORAL_TABLET | ORAL | 1 refills | Status: DC

## 2017-03-19 ENCOUNTER — Ambulatory Visit (INDEPENDENT_AMBULATORY_CARE_PROVIDER_SITE_OTHER): Payer: Medicare Other | Admitting: General Practice

## 2017-03-19 DIAGNOSIS — Z5181 Encounter for therapeutic drug level monitoring: Secondary | ICD-10-CM

## 2017-03-19 LAB — POCT INR: INR: 3

## 2017-03-19 NOTE — Patient Instructions (Signed)
Pre visit review using our clinic review tool, if applicable. No additional management support is needed unless otherwise documented below in the visit note. 

## 2017-04-30 ENCOUNTER — Ambulatory Visit: Payer: Medicare Other

## 2017-05-02 ENCOUNTER — Ambulatory Visit (INDEPENDENT_AMBULATORY_CARE_PROVIDER_SITE_OTHER): Payer: Medicare Other | Admitting: General Practice

## 2017-05-02 DIAGNOSIS — Z5181 Encounter for therapeutic drug level monitoring: Secondary | ICD-10-CM | POA: Diagnosis not present

## 2017-05-02 LAB — POCT INR: INR: 2.3

## 2017-05-02 NOTE — Patient Instructions (Signed)
Pre visit review using our clinic review tool, if applicable. No additional management support is needed unless otherwise documented below in the visit note. 

## 2017-06-05 ENCOUNTER — Encounter: Payer: Self-pay | Admitting: Cardiovascular Disease

## 2017-06-05 ENCOUNTER — Ambulatory Visit (INDEPENDENT_AMBULATORY_CARE_PROVIDER_SITE_OTHER): Payer: Medicare Other | Admitting: Cardiovascular Disease

## 2017-06-05 VITALS — BP 122/88 | HR 100 | Ht 72.0 in | Wt 239.0 lb

## 2017-06-05 DIAGNOSIS — R609 Edema, unspecified: Secondary | ICD-10-CM

## 2017-06-05 DIAGNOSIS — I482 Chronic atrial fibrillation, unspecified: Secondary | ICD-10-CM

## 2017-06-05 DIAGNOSIS — I10 Essential (primary) hypertension: Secondary | ICD-10-CM

## 2017-06-05 DIAGNOSIS — Z7901 Long term (current) use of anticoagulants: Secondary | ICD-10-CM | POA: Diagnosis not present

## 2017-06-05 DIAGNOSIS — E669 Obesity, unspecified: Secondary | ICD-10-CM

## 2017-06-05 DIAGNOSIS — E782 Mixed hyperlipidemia: Secondary | ICD-10-CM

## 2017-06-05 MED ORDER — HYDROCHLOROTHIAZIDE 12.5 MG PO CAPS: 12.5000 mg | ORAL_CAPSULE | ORAL | 3 refills | Status: DC | PRN

## 2017-06-05 MED ORDER — METOPROLOL TARTRATE 50 MG PO TABS: ORAL_TABLET | ORAL | 3 refills | Status: DC

## 2017-06-05 NOTE — Progress Notes (Signed)
Primary MD: Dr. Bluford Kaufmann  PATIENT PROFILE: Glen [name] is a 83 y.o. male who is a former patient of Dr. Mare Ferrari who established care with me in September 2017.  He presents for six-month follow-up cardiology evaluation.  HPI:  Glen [name] has a history of permanent AF, hypertension and hyperlipidemia. His AF has always been treated with rate control and he neveer had an attempt of cardioversion. He is on coumadin anticoagulation with a cha2ds2vasc score of 3. In the past he was treated with diltiazem and atenolol. He had gingival disease and was taken off diltiazem. Most recently he has been on metoprolol 50 mg bid for rate control but had reduced this to 25 mg bid since he felt that it had contributed to edema  He denies any awareness of sleep apnea. He does not exercise due to knee issues and gets injections into his kness intermittently. He last saw Dr. Mare Ferrari in 11/2015 and complained that his feet :were puffy on the bottom."   When I  saw him, I did not feel that the metoprolol was contributing to his feet discomfort.  He had been switched to act to atenolol and most recently is now on metoprolol 50 mg in the morning and 25 mg, which she has been taking the early afternoon per Dr. Burnice Logan.  He continues to be on warfarin anticoagulation.  He continues to experience "puffiness "of his ankles.  At times he admits to eating food with increase sodium.  He denies chest pain.  He denies PND, orthopnea.  He denies bleeding.  He presents for reevaluation.  Past Medical History:  Diagnosis Date  . Atrial fibrillation ([place])   . Diverticulitis   . Drug therapy    Anticoagulation Therapy  . Family history of diabetes insipidus   . FHx: colon cancer   . History of colonic polyps   . Hx of echocardiogram    a. Echo 5/11:  EF 55-60%, mild dilated ascending aorta, mod LAE  . Hyperlipidemia   . Hypertension   . Neoplasm of prostate    special screening malignant   .  Obesity   . Palpitation     Past Surgical History:  Procedure Laterality Date  . 2-D Echocardiogram  November 2009, 2011  . Anal Fistula Repair    . CARDIAC VALVE SURGERY  1996   status post balloon val[facility]ul[facility] at [facility]  . CATARACT EXTRACTION  2004  . COLONOSCOPY  December 2005  . ETT  1991   Negative  . KNEE ARTHROSCOPY     Right  . TONSILLECTOMY      Allergies  Allergen Reactions  . Codeine Other (See Comments)    constpation  . Diltiazem     Gingival hyperplasia    Current Outpatient Prescriptions  Medication Sig Dispense Refill  . acetaminophen (TYLENOL) 500 MG tablet Take 1,000 mg by mouth every 6 (six) hours as needed for pain.     . metoprolol tartrate (LOPRESSOR) 50 MG tablet Take 1 tablet (50 mg) two times daily 180 tablet 3  . warfarin (COUMADIN) 5 MG tablet Take as directed by anticoagulation clinic 90 tablet 1  . hydrochlorothiazide (MICROZIDE) 12.5 MG capsule Take 1 capsule (12.5 mg total) by mouth as needed. 30 capsule 3   No current facility-administered medications for this visit.     Social History   Social History  . Marital status: Married    Spouse name: N/A  . Number of children: N/A  .  Years of education: N/A   Occupational History  . Retired Retired   Social History Main Topics  . Smoking status: Never Smoker  . Smokeless tobacco: Never Used  . Alcohol use No  . Drug use: Unknown  . Sexual activity: Not on file   Other Topics Concern  . Not on file   Social History Narrative   Regular exercise: Yes: [employer] 4 times/week    Family History  Problem Relation Age of Onset  . Heart attack Father   . Colon cancer Unknown   . Diabetes Unknown   . Prostate cancer Brother   . Heart disease Unknown   . Coronary artery disease Unknown        Two siblings, status post stenting to siblings with heart disease. One. Status post pacemaker insertion.  . Stroke Brother     ROS General: Negative; No fevers, chills, or night sweats HEENT:  Negative; No changes in vision or hearing, sinus congestion, difficulty swallowing Pulmonary: Negative; No cough, wheezing, shortness of breath, hemoptysis Cardiovascular:  See HPI; No chest pain, presyncope, syncope, palpitations, edema GI: Negative; No nausea, vomiting, diarrhea, or abdominal pain GU: Negative; No dysuria, hematuria, or difficulty voiding Musculoskeletal: Negative; no myalgias, joint pain, or weakness Hematologic/Oncologic: Negative; no easy bruising, bleeding Endocrine: Negative; no heat/cold intolerance; no diabetes Neuro: Negative; no changes in balance, headaches Skin: Negative; No rashes or skin lesions Psychiatric: Negative; No behavioral problems, depression Sleep: Negative; No daytime sleepiness, hypersomnolence, bruxism, restless legs, hypnogagnic hallucinations Other comprehensive 14 point system review is negative   Physical Exam BP 122/88   Pulse 100   Ht 6' (1.829 m)   Wt 239 lb (108.4 kg)   BMI 32.41 kg/m     Wt Readings from Last 3 Encounters:  06/05/17 239 lb (108.4 kg)  01/23/17 235 lb 6.4 oz (106.8 kg)  12/13/16 235 lb 12.8 oz (107 kg)   General: Alert, oriented, no distress.  Skin: normal turgor, no rashes, warm and dry HEENT: Normocephalic, atraumatic. Pupils equal round and reactive to light; sclera anicteric; extraocular muscles intact;  Nose without nasal septal hypertrophy Mouth/Parynx benign; Mallinpatti scale 3 Neck: No JVD, no carotid bruits; normal carotid upstroke Lungs: clear to ausculatation and percussion; no wheezing or rales Chest wall: without tenderness to palpitation Heart: PMI not displaced, irregularly irregular with a ventricular rate at 100 bpm., s1 s2 normal, 1/6 systolic murmur, no diastolic murmur, no rubs, gallops, thrills, or heaves Abdomen: Central adiposity soft, nontender; no hepatosplenomehaly, BS+; abdominal aorta nontender and not dilated by palpation. Back: no CVA tenderness Pulses 2+ Musculoskeletal:  full range of motion, normal strength, no joint deformities Extremities: Dependent rubor; small varicosity left lower extremity.  Trace bilateral ankle edema no clubbing cyanosis, Homan's sign negative  Neurologic: grossly nonfocal; Cranial nerves grossly wnl Psychologic: Normal mood and affect  ECG (independently read by me): Atrial fibrillation with ventricular rate at approximately 100 bpm.  Q wave in aVF; low voltage.  Early transition.  No significant ST changes  March 2018 ECG (independently read by me): Atrial fibrillation with ventricular rate at 104 bpm.  QTc interval 433 ms.  September 2017 ECG (independently read by me): AF at 106 without significant STT changes.  LABS:  BMP Latest Ref Rng & Units 07/14/2016 06/16/2016 11/18/2015  Glucose 65 - 99 mg/dL 101(H) 100(H) 130(H)  BUN 7 - 25 mg/dL 19 17 15   Creatinine 0.70 - 1.11 mg/dL 1.25(H) 1.25(H) 1.00  Sodium 135 - 146 mmol/L 138 141 137  Potassium 3.5 - 5.3 mmol/L 4.8 4.8 4.4  Chloride 98 - 110 mmol/L 104 105 105  CO2 20 - 31 mmol/L 25 26 25   Calcium 8.6 - 10.3 mg/dL 9.1 9.1 9.4     Hepatic Function Latest Ref Rng & Units 07/14/2016 06/16/2016 04/19/2015  Total Protein 6.1 - 8.1 g/dL 6.5 6.5 6.8  Albumin 3.6 - 5.1 g/dL 4.0 4.1 4.0  AST 10 - 35 U/L 19 19 19   ALT 9 - 46 U/L 16 17 18   Alk Phosphatase 40 - 115 U/L 67 69 73  Total Bilirubin 0.2 - 1.2 mg/dL 0.8 0.8 0.7  Bilirubin, Direct 0.0 - 0.3 mg/dL - - -    CBC Latest Ref Rng & Units 06/16/2016 11/18/2015 04/19/2015  WBC 3.8 - 10.8 K/uL 8.8 8.8 8.4  Hemoglobin 13.2 - 17.1 g/dL 17.5(H) 17.0 17.2(H)  Hematocrit 38.5 - 50.0 % 51.8(H) 49.9 50.8  Platelets 140 - 400 K/uL 203 206 197.0   Lab Results  Component Value Date   MCV 95.2 06/16/2016   MCV 94.5 11/18/2015   MCV 95.4 04/19/2015   Lab Results  Component Value Date   TSH 2.48 06/16/2016   No results found for: HGBA1C  MG 2.1  BNP    Component Value Date/Time   BNP 27.4 11/18/2015 1024    ProBNP No  results found for: PROBNP   Lipid Panel     Component Value Date/Time   CHOL 210 (H) 06/16/2016 0946   TRIG 215 (H) 06/16/2016 0946   TRIG 150 (H) 09/06/2006 1031   HDL 42 06/16/2016 0946   CHOLHDL 5.0 06/16/2016 0946   VLDL 43 (H) 06/16/2016 0946   LDLCALC 125 06/16/2016 0946   LDLDIRECT 126.0 04/19/2015 1000    RADIOLOGY: No results found.   IMPRESSION:  1. Chronic atrial fibrillation ([place])   2. Essential hypertension   3. Mixed hyperlipidemia   4. Anticoagulation adequate   5. Mild obesity   6. Peripheral edema     ASSESSMENT AND PLAN: Glen [name] is an 84 year old [place] male who has a history of hypertension, hyperlidemia and permanent AF on coumadin anticoagulation.  His last echo which was done in 12/2014 and compared to his prior 2011 study EF remains normal at 55 - 60%. LA is moderately dilated from his AF.His INR goal is to 2.5 on warfarin anticoagulation.  When I last saw the patient in March, I had recommended titration of metoprolol and as result he is now on 50 mg in the morning and 25 mg and has been taking the afternoon dose in the early afternoon shortly after lunch.  His heart rate today is still increased.  I have recommended he increase metoprolol to 50 twice a day, but take his second dose before dinner rather than immediately after lunch.  He also has some dependent rubor with mild edema of his ankles.  I suspect he may have some mild venous insufficiency.  I'm giving her a prescription for HCTZ 12.5 mg to take on an as-needed basis.  I also have recommended support stockings.  He will be seeing Dr. Burnice Logan will be obtaining follow-up laboratory.  I reviewed his blood work from last year when checked by Dr. Burnice Logan.  At that time his cholesterol was elevated at 210, LDL 125, and triglycerides were increased at 2:15 with elevation of the LDL.  I discussed dietary improvement.  I discussed reduction in sodium.  He will have follow-up blood work with Dr. Raliegh Ip.   As  long as he remains stable I will see him in 6 months for reevaluation.   Time spent: 25 minutes.  Troy Sine, MD, [facility] 06/05/2017 12:42 PM

## 2017-06-05 NOTE — Patient Instructions (Signed)
Medication Instructions:  INCREASE metoprolol tartrate (Lopressor) to 50 mg (1 tablet) two times a day  Take HCTZ 12.5mg  (1 tablet) as needed  Follow-Up: Your physician wants you to follow-up in: 6 MONTHS with Dr. Claiborne Billings. You will receive a reminder letter in the mail two months in advance. If you don't receive a letter, please call our office to schedule the follow-up appointment.  Any Other Special Instructions Will Be Listed Below (If Applicable).   How to Use Compression Stockings Compression stockings are elastic socks that squeeze the legs. They help to increase blood flow to the legs, decrease swelling in the legs, and reduce the chance of developing blood clots in the lower legs. Compression stockings are often used by people who:  Are recovering from surgery.  Have poor circulation in their legs.  Are prone to getting blood clots in their legs.  Have varicose veins.  Sit or stay in bed for long periods of time.  How to use compression stockings Before you put on your compression stockings:  Make sure that they are the correct size. If you do not know your size, ask your health care provider.  Make sure that they are clean, dry, and in good condition.  Check them for rips and tears. Do not put them on if they are ripped or torn.  Put your stockings on first thing in the morning, before you get out of bed. Keep them on for as long as your health care provider advises. When you are wearing your stockings:  Keep them as smooth as possible. Do not allow them to bunch up. It is especially important to prevent the stockings from bunching up around your toes or behind your knees.  Do not roll the stockings downward and leave them rolled down. This can decrease blood flow to your leg.  Change them right away if they become wet or dirty. 1.   When you take off your stockings, inspect your legs and feet. Anything that does not seem normal may require medical attention. Look  for:  Open sores.  Red spots.  Swelling.  Information and tips  Do not stop wearing your compression stockings without talking to your health care provider first.  Wash your stockings every day with mild detergent in cold or warm water. Do not use bleach. Air-dry your stockings or dry them in a clothes dryer on low heat.  Replace your stockings every 3-6 months.  If skin moisturizing is part of your treatment plan, apply lotion or cream at night so that your skin will be dry when you put on the stockings in the morning. It is harder to put the stockings on when you have lotion on your legs or feet. Contact a health care provider if: Remove your stockings and seek medical care if:  You have a feeling of pins and needles in your feet or legs.  You have any new changes in your skin.  You have skin lesions that are getting worse.  You have swelling or pain that is getting worse.  Get help right away if:  You have numbness or tingling in your lower legs that does not get better right after you take the stockings off.  Your toes or feet become cold and blue.  You develop open sores or red spots on your legs that do not go away.  You see or feel a warm spot on your leg.  You have new swelling or soreness in your leg.  You are  short of breath or you have chest pain for no reason.  You have a rapid or irregular heartbeat.  You feel light-headed or dizzy. This information is not intended to replace advice given to you by your health care provider. Make sure you discuss any questions you have with your health care provider. Document Released: 07/16/2009 Document Revised: 02/16/2016 Document Reviewed: 08/26/2014 Elsevier Interactive Patient Education  [employer].    If you need a refill on your cardiac medications before your next appointment, please call your pharmacy.

## 2017-06-11 ENCOUNTER — Ambulatory Visit (INDEPENDENT_AMBULATORY_CARE_PROVIDER_SITE_OTHER): Payer: Medicare Other | Admitting: General Practice

## 2017-06-11 DIAGNOSIS — Z7901 Long term (current) use of anticoagulants: Secondary | ICD-10-CM

## 2017-06-11 DIAGNOSIS — Z5181 Encounter for therapeutic drug level monitoring: Secondary | ICD-10-CM

## 2017-06-11 LAB — POCT INR: INR: 2.9

## 2017-06-11 NOTE — Patient Instructions (Signed)
Pre visit review using our clinic review tool, if applicable. No additional management support is needed unless otherwise documented below in the visit note. 

## 2017-06-13 DIAGNOSIS — Z7901 Long term (current) use of anticoagulants: Secondary | ICD-10-CM | POA: Insufficient documentation

## 2017-06-21 ENCOUNTER — Encounter: Payer: Self-pay | Admitting: Internal Medicine

## 2017-07-03 ENCOUNTER — Ambulatory Visit: Payer: Medicare Other | Admitting: Cardiovascular Disease

## 2017-07-11 ENCOUNTER — Encounter: Payer: Self-pay | Admitting: Internal Medicine

## 2017-07-23 ENCOUNTER — Ambulatory Visit (INDEPENDENT_AMBULATORY_CARE_PROVIDER_SITE_OTHER): Payer: Medicare Other | Admitting: General Practice

## 2017-07-23 DIAGNOSIS — Z7901 Long term (current) use of anticoagulants: Secondary | ICD-10-CM | POA: Diagnosis not present

## 2017-07-23 DIAGNOSIS — Z23 Encounter for immunization: Secondary | ICD-10-CM

## 2017-07-23 LAB — POCT INR: INR: 2.7

## 2017-07-23 NOTE — Patient Instructions (Signed)
Pre visit review using our clinic review tool, if applicable. No additional management support is needed unless otherwise documented below in the visit note. 

## 2017-07-31 ENCOUNTER — Encounter: Payer: Self-pay | Admitting: Internal Medicine

## 2017-07-31 ENCOUNTER — Ambulatory Visit (INDEPENDENT_AMBULATORY_CARE_PROVIDER_SITE_OTHER): Payer: Medicare Other | Admitting: Internal Medicine

## 2017-07-31 VITALS — BP 128/82 | HR 103 | Temp 97.8°F | Ht 72.0 in | Wt 240.4 lb

## 2017-07-31 DIAGNOSIS — R7302 Impaired glucose tolerance (oral): Secondary | ICD-10-CM | POA: Diagnosis not present

## 2017-07-31 DIAGNOSIS — Z7901 Long term (current) use of anticoagulants: Secondary | ICD-10-CM | POA: Diagnosis not present

## 2017-07-31 DIAGNOSIS — I10 Essential (primary) hypertension: Secondary | ICD-10-CM | POA: Diagnosis not present

## 2017-07-31 DIAGNOSIS — E785 Hyperlipidemia, unspecified: Secondary | ICD-10-CM | POA: Diagnosis not present

## 2017-07-31 DIAGNOSIS — I480 Paroxysmal atrial fibrillation: Secondary | ICD-10-CM

## 2017-07-31 DIAGNOSIS — Z0000 Encounter for general adult medical examination without abnormal findings: Secondary | ICD-10-CM | POA: Diagnosis not present

## 2017-07-31 LAB — COMPREHENSIVE METABOLIC PANEL
ALT: 22 U/L (ref 0–53)
AST: 23 U/L (ref 0–37)
Albumin: 4.1 g/dL (ref 3.5–5.2)
Alkaline Phosphatase: 83 U/L (ref 39–117)
BUN: 20 mg/dL (ref 6–23)
CO2: 26 mEq/L (ref 19–32)
Calcium: 9.6 mg/dL (ref 8.4–10.5)
Chloride: 104 mEq/L (ref 96–112)
Creatinine, Ser: 1.09 mg/dL (ref 0.40–1.50)
GFR: 67.95 mL/min (ref >60.00)
Glucose, Bld: 103 mg/dL — ABNORMAL HIGH (ref 70–99)
Potassium: 4.7 mEq/L (ref 3.5–5.1)
Sodium: 140 mEq/L (ref 135–145)
Total Bilirubin: 0.6 mg/dL (ref 0.2–1.2)
Total Protein: 6.5 g/dL (ref 6.0–8.3)

## 2017-07-31 LAB — HEMOGLOBIN A1C: Hgb A1c MFr Bld: 5.9 % (ref 4.6–6.5)

## 2017-07-31 LAB — CBC WITH DIFFERENTIAL/PLATELET
Basophils Absolute: 0.1 10*3/uL (ref 0.0–0.1)
Basophils Relative: 0.8 % (ref 0.0–3.0)
Eosinophils Absolute: 0.2 10*3/uL (ref 0.0–0.7)
Eosinophils Relative: 2.3 % (ref 0.0–5.0)
HCT: 50.6 % (ref 39.0–52.0)
Hemoglobin: 17 g/dL (ref 13.0–17.0)
Lymphocytes Relative: 28.3 % (ref 12.0–46.0)
Lymphs Abs: 2.1 10*3/uL (ref 0.7–4.0)
MCHC: 33.7 g/dL (ref 30.0–36.0)
MCV: 96.8 fl (ref 78.0–100.0)
Monocytes Absolute: 0.7 10*3/uL (ref 0.1–1.0)
Monocytes Relative: 9.4 % (ref 3.0–12.0)
Neutro Abs: 4.4 10*3/uL (ref 1.4–7.7)
Neutrophils Relative %: 59.2 % (ref 43.0–77.0)
Platelets: 210 10*3/uL (ref 150.0–400.0)
RBC: 5.23 Mil/uL (ref 4.22–5.81)
RDW: 13.6 % (ref 11.5–15.5)
WBC: 7.4 10*3/uL (ref 4.0–10.5)

## 2017-07-31 LAB — TSH: TSH: 2.95 u[IU]/mL (ref 0.35–4.50)

## 2017-07-31 MED ORDER — METOPROLOL TARTRATE 100 MG PO TABS: ORAL_TABLET | ORAL | 4 refills | Status: DC

## 2017-07-31 NOTE — Patient Instructions (Signed)
Limit your sodium (Salt) intake    It is important that you exercise regularly, at least 20 minutes 3 to 4 times per week.  If you develop chest pain or shortness of breath seek  medical attention.  You need to lose weight.  Consider a lower calorie diet and regular exercise.  Return in 6 months for follow-up   

## 2017-07-31 NOTE — Progress Notes (Signed)
Subjective:    Patient ID: Glen [name], male    DOB: Apr 26, 1928, 83 y.o.   MRN: [id number]  HPI  83 year old patient who is seen today for an annual competency exam as well as subsequent Medicare wellness visit He is followed by cardiology with chronic atrial fibrillation with rate control and chronic Coumadin anticoagulation. He has essential hypertension  Doing quite well without cardiopulmonary complaints.  Metoprolol has been increased to 100 mg twice daily for better rate control  Past Medical History:  Diagnosis Date  . Atrial fibrillation ([place])   . Diverticulitis   . Drug therapy    Anticoagulation Therapy  . Family history of diabetes insipidus   . FHx: colon cancer   . History of colonic polyps   . Hx of echocardiogram    a. Echo 5/11:  EF 55-60%, mild dilated ascending aorta, mod LAE  . Hyperlipidemia   . Hypertension   . Neoplasm of prostate    special screening malignant   . Obesity   . Palpitation      Social History   Social History  . Marital status: Married    Spouse name: N/A  . Number of children: N/A  . Years of education: N/A   Occupational History  . Retired Retired   Social History Main Topics  . Smoking status: Never Smoker  . Smokeless tobacco: Never Used  . Alcohol use No  . Drug use: Unknown  . Sexual activity: Not on file   Other Topics Concern  . Not on file   Social History Narrative   Regular exercise: Yes: [employer] 4 times/week    Past Surgical History:  Procedure Laterality Date  . 2-D Echocardiogram  November 2009, 2011  . Anal Fistula Repair    . CARDIAC VALVE SURGERY  1996   status post balloon valvul[facility] at [facility]  . CATARACT EXTRACTION  2004  . COLONOSCOPY  December 2005  . ETT  1991   Negative  . KNEE ARTHROSCOPY     Right  . TONSILLECTOMY      Family History  Problem Relation Age of Onset  . Heart attack Father   . Colon cancer Unknown   . Diabetes Unknown   . Prostate cancer Brother   . Heart  disease Unknown   . Coronary artery disease Unknown        Two siblings, status post stenting to siblings with heart disease. One. Status post pacemaker insertion.  . Stroke Brother     Allergies  Allergen Reactions  . Codeine Other (See Comments)    constpation  . Diltiazem     Gingival hyperplasia    Current Outpatient Prescriptions on File Prior to Visit  Medication Sig Dispense Refill  . acetaminophen (TYLENOL) 500 MG tablet Take 1,000 mg by mouth every 6 (six) hours as needed for pain.     . metoprolol tartrate (LOPRESSOR) 50 MG tablet Take 1 tablet (50 mg) two times daily 180 tablet 3  . warfarin (COUMADIN) 5 MG tablet Take as directed by anticoagulation clinic 90 tablet 1  . hydrochlorothiazide (MICROZIDE) 12.5 MG capsule Take 1 capsule (12.5 mg total) by mouth as needed. (Patient not taking: Reported on 07/31/2017) 30 capsule 3   No current facility-administered medications on file prior to visit.     BP 128/82 (BP Location: Left Arm, Patient Position: Sitting, Cuff Size: Normal)   Pulse (!) 103   Temp 97.8 F (36.6 C) (Oral)   Ht 6' (1.829 m)  Wt 240 lb 6.4 oz (109 kg)   SpO2 96%   BMI 32.60 kg/m    Subsequent Medicare wellness visit  1. Risk factors based on Past M, S, F history: risk factors include a family history of prostate and colon cancer.  Cardio vascular risk factors include hypertension.  He does have chronic atrial fibrillation 2. Physical Activities: fairly sedentary, but no exercise restrictions  3. Depression/mood: no history of depression or mood disorder  4. Hearing: minor  hearing deficits  5. ADL's: completely independent in all aspects of daily living  6. Fall Risk: Moderate due to age and obesity 7. Home Safety: no problems identified  8. Height, weight, &visual acuity: No change in visual acuity.  9. Counseling: heart healthy diet calorie restriction and more exercise. All encouraged  10. Labs ordered based on risk factors: laboratory  profile, including lipid panel will be reviewed. PSA test in discuss and it was elected to defer  11. Referral Coordination-  follow up cardiology 12. Care Plan- heart healthy diet restricted salt and weight loss. All encouraged  13. Cognitive Assessment- alert and oriented, with normal affect. Several family members have early dementia  15.  Preventive services will include annual health examinations with screening lab.  No further screening colonoscopies.  Appropriate.  Stool hematest negative.  Today will continue biannual cardiology follow-up. Patient was provided with a written and personalized care plan 15.  Provider list includes primary care cardiology and ophthalmology   Review of Systems  Constitutional: Negative for appetite change, chills, fatigue and fever.  HENT: Positive for hearing loss. Negative for congestion, dental problem, ear pain, sore throat, tinnitus, trouble swallowing and voice change.   Eyes: Negative for pain, discharge and visual disturbance.  Respiratory: Negative for cough, chest tightness, wheezing and stridor.   Cardiovascular: Negative for chest pain, palpitations and leg swelling.  Gastrointestinal: Negative for abdominal distention, abdominal pain, blood in stool, constipation, diarrhea, nausea and vomiting.  Genitourinary: Negative for difficulty urinating, discharge, flank pain, genital sores, hematuria and urgency.  Musculoskeletal: Positive for gait problem. Negative for arthralgias, back pain, joint swelling, myalgias and neck stiffness.  Skin: Negative for rash.  Neurological: Negative for dizziness, syncope, speech difficulty, weakness, numbness and headaches.  Hematological: Negative for adenopathy. Does not bruise/bleed easily.  Psychiatric/Behavioral: Negative for behavioral problems and dysphoric mood. The patient is not nervous/anxious.        Objective:   Physical Exam  Constitutional: He appears well-developed and well-nourished.  Weight  240 Blood pressure 110/72  HENT:  Head: Normocephalic and atraumatic.  Right Ear: External ear normal.  Left Ear: External ear normal.  Nose: Nose normal.  Mouth/Throat: Oropharynx is clear and moist.  Eyes: Pupils are equal, round, and reactive to light. Conjunctivae and EOM are normal. No scleral icterus.  Neck: Normal range of motion. Neck supple. No JVD present. No thyromegaly present.  Cardiovascular: Normal heart sounds.  Exam reveals no gallop and no friction rub.   No murmur heard. Pedal pulses not easily palpable  Irregular rhythm with rate of 90  Pulmonary/Chest: Effort normal. He has rales. He exhibits no tenderness.  Few crackles right base  Abdominal: Soft. Bowel sounds are normal. He exhibits no distension and no mass. There is no tenderness.  Genitourinary: Penis normal. Rectal exam shows guaiac negative stool.  Musculoskeletal: Normal range of motion. He exhibits edema. He exhibits no tenderness.  Plus 1 pedal edema  Lymphadenopathy:    He has no cervical adenopathy.  Neurological: He is  alert. He has normal reflexes. No cranial nerve deficit. Coordination normal.  Absent vibratory sensation distally  Skin: Skin is warm and dry. No rash noted.  Psychiatric: He has a normal mood and affect. His behavior is normal.          Assessment & Plan:   Essential hypertension, stable Chronic atrial fibrillation.  Continue rate control and chronic anticoagulation Subsequent Medicare wellness visit  Obesity.  Weight loss encouragedHistory of impaired glucose tolerance.  We'll review a blood sugar and hemoglobin A1c Dyslipidemia.  Review lipid profile  Cardiology follow-up as scheduled Return here 6 months  Laina Guerrieri Pilar Plate

## 2017-08-07 ENCOUNTER — Encounter: Payer: Self-pay | Admitting: Internal Medicine

## 2017-08-08 ENCOUNTER — Other Ambulatory Visit: Payer: Self-pay | Admitting: Internal Medicine

## 2017-08-08 MED ORDER — METOPROLOL TARTRATE 50 MG PO TABS: ORAL_TABLET | ORAL | 6 refills | Status: DC

## 2017-08-28 ENCOUNTER — Other Ambulatory Visit: Payer: Self-pay | Admitting: Internal Medicine

## 2017-09-03 ENCOUNTER — Other Ambulatory Visit: Payer: Medicare Other

## 2017-09-03 ENCOUNTER — Ambulatory Visit (INDEPENDENT_AMBULATORY_CARE_PROVIDER_SITE_OTHER): Payer: Medicare Other | Admitting: General Practice

## 2017-09-03 DIAGNOSIS — Z7901 Long term (current) use of anticoagulants: Secondary | ICD-10-CM | POA: Diagnosis not present

## 2017-09-03 LAB — POCT INR: INR: 3.3

## 2017-09-03 NOTE — Patient Instructions (Signed)
Pre visit review using our clinic review tool, if applicable. No additional management support is needed unless otherwise documented below in the visit note.  Skip dose today and then continue to take 1 tablet daily.  Re-check in 4 weeks.    

## 2017-09-21 ENCOUNTER — Ambulatory Visit: Payer: Medicare Other | Admitting: Family Medicine

## 2017-09-21 ENCOUNTER — Ambulatory Visit (INDEPENDENT_AMBULATORY_CARE_PROVIDER_SITE_OTHER): Payer: Medicare Other | Admitting: Family Medicine

## 2017-09-21 VITALS — BP 112/70 | HR 92 | Temp 97.4°F

## 2017-09-21 DIAGNOSIS — L959 Vasculitis limited to the skin, unspecified: Secondary | ICD-10-CM

## 2017-09-21 DIAGNOSIS — L309 Dermatitis, unspecified: Secondary | ICD-10-CM | POA: Diagnosis not present

## 2017-09-21 MED ORDER — HYDROCORTISONE 1 % EX CREA: 1.0000 "application " | TOPICAL_CREAM | Freq: Two times a day (BID) | CUTANEOUS | 0 refills | Status: DC

## 2017-09-21 MED ORDER — DOXYCYCLINE HYCLATE 100 MG PO TABS: 100.0000 mg | ORAL_TABLET | Freq: Two times a day (BID) | ORAL | 0 refills | Status: AC

## 2017-09-21 NOTE — Patient Instructions (Addendum)
Rash A rash is a change in the color of the skin. A rash can also change the way your skin feels. There are many different conditions and factors that can cause a rash. Follow these instructions at home: Pay attention to any changes in your symptoms. Follow these instructions to help with your condition: Medicine Take or apply over-the-counter and prescription medicines only as told by your health care provider. These may include:  Corticosteroid cream.  Anti-itch lotions.  Oral antihistamines.  Skin Care  Apply cool compresses to the affected areas.  Try taking a bath with: ? Epsom salts. Follow the instructions on the packaging. You can get these at your local pharmacy or grocery store. ? Baking soda. Pour a small amount into the bath as told by your health care provider. ? Colloidal oatmeal. Follow the instructions on the packaging. You can get this at your local pharmacy or grocery store.  Try applying baking soda paste to your skin. Stir water into baking soda until it reaches a paste-like consistency.  Do not scratch or rub your skin.  Avoid covering the rash. Make sure the rash is exposed to air as much as possible. General instructions  Avoid hot showers or baths, which can make itching worse. A cold shower may help.  Avoid scented soaps, detergents, and perfumes. Use gentle soaps, detergents, perfumes, and other cosmetic products.  Avoid any substance that causes your rash. Keep a journal to help track what causes your rash. Write down: ? What you eat. ? What cosmetic products you use. ? What you drink. ? What you wear. This includes jewelry.  Keep all follow-up visits as told by your health care provider. This is important. Contact a health care provider if:  You sweat at night.  You lose weight.  You urinate more than normal.  You feel weak.  You vomit.  Your skin or the whites of your eyes look yellow (jaundice).  Your skin: ? Tingles. ? Is  numb.  Your rash: ? Does not go away after several days. ? Gets worse.  You are: ? Unusually thirsty. ? More tired than normal.  You have: ? New symptoms. ? Pain in your abdomen. ? A fever. ? Diarrhea. Get help right away if:  You develop a rash that covers all or most of your body. The rash may or may not be painful.  You develop blisters that: ? Are on top of the rash. ? Grow larger or grow together. ? Are painful. ? Are inside your nose or mouth.  You develop a rash that: ? Looks like purple pinprick-sized spots all over your body. ? Has a "bull's eye" or looks like a target. ? Is not related to sun exposure, is red and painful, and causes your skin to peel. This information is not intended to replace advice given to you by your health care provider. Make sure you discuss any questions you have with your health care provider. Document Released: 09/08/2002 Document Revised: 02/22/2016 Document Reviewed: 02/03/2015 Elsevier Interactive Patient Education  2018 [employer]  

## 2017-09-21 NOTE — Progress Notes (Signed)
Subjective:    Patient ID: Glen [name], male    DOB: 30-Jul-1928, 83 y.o.   MRN: [id number]  Chief Complaint  Patient presents with  . Rash    HPI Patient was seen today for acute issue.  Pt with a rash on lower legs x 4 days.  Pt states started as a small dime-sized area on his R leg.  Pt then feels like when he went to sleep it began on the L leg.  Pt attributes the spread of the rash to his pant leg going up when he was sleeping and the skin of his legs touching each other.  The rash is mildly pruritic.  He has not put anything on it.  Pt denies changes in soaps, lotions, detergents, any contact with trees/shrubs or animals, or insect bites.  Pt is on coumadin 5 mg daily.  Past Medical History:  Diagnosis Date  . Atrial fibrillation ([place])   . Diverticulitis   . Drug therapy    Anticoagulation Therapy  . Family history of diabetes insipidus   . FHx: colon cancer   . History of colonic polyps   . Hx of echocardiogram    a. Echo 5/11:  EF 55-60%, mild dilated ascending aorta, mod LAE  . Hyperlipidemia   . Hypertension   . Neoplasm of prostate    special screening malignant   . Obesity   . Palpitation     Allergies  Allergen Reactions  . Codeine Other (See Comments)    constpation  . Diltiazem     Gingival hyperplasia    ROS General: Denies fever, chills, night sweats, changes in weight, changes in appetite HEENT: Denies headaches, ear pain, changes in vision, rhinorrhea, sore throat CV: Denies CP, palpitations, SOB, orthopnea Pulm: Denies SOB, cough, wheezing GI: Denies abdominal pain, nausea, vomiting, diarrhea, constipation GU: Denies dysuria, hematuria, frequency, vaginal discharge Msk: Denies muscle cramps, joint pains Neuro: Denies weakness, numbness, tingling Skin: Denies bruising.  +rash Psych: Denies depression, anxiety, hallucinations     Objective:    Blood pressure 112/70, pulse 92, temperature (!) 97.4 F (36.3 C), temperature source Oral.   Gen.  Pleasant, well-nourished, in no distress, normal affect  HEENT: [place]/AT, face symmetric, no scleral icterus, PERRLA, EOMI, nares patent without drainage, pharynx without erythema or exudate. Lungs: no accessory muscle use, CTAB, no wheezes or rales Cardiovascular: RRR, no m/r/g, no peripheral edema Neuro:  A&Ox3, CN II-XII intact, normal gait Skin:  Warm, dry, intact.  L medial knee/leg with 6-7cm blanchable, erythematous plaque, with fairly well demarcated boarders.  R leg with smaller erythematous, nonblanchable plaques.  The largest, 7cm on the R medial knee and then on medial shin.  A smaller ~2 cm plaque similar in appearance on R shin to the R of the previous plaque.  There are 2 small pustules, yellow in color on this lesion.   Wt Readings from Last 3 Encounters:  07/31/17 240 lb 6.4 oz (109 kg)  06/05/17 239 lb (108.4 kg)  01/23/17 235 lb 6.4 oz (106.8 kg)    Lab Results  Component Value Date   WBC 7.4 07/31/2017   HGB 17.0 07/31/2017   HCT 50.6 07/31/2017   PLT 210.0 07/31/2017   GLUCOSE 103 (H) 07/31/2017   CHOL 210 (H) 06/16/2016   TRIG 215 (H) 06/16/2016   HDL 42 06/16/2016   LDLDIRECT 126.0 04/19/2015   LDLCALC 125 06/16/2016   ALT 22 07/31/2017   AST 23 07/31/2017   NA 140 07/31/2017  K 4.7 07/31/2017   CL 104 07/31/2017   CREATININE 1.09 07/31/2017   BUN 20 07/31/2017   CO2 26 07/31/2017   TSH 2.95 07/31/2017   PSA 1.22 09/06/2006   INR 3.3 09/03/2017   HGBA1C 5.9 07/31/2017    Assessment/Plan:  Dermatitis  -Discussed possible causes of rash including contact rxn, medications, infection, etc. -Given presence of small yellow colored pustules and mild warmth of one lesion, concern for cellulitis.  Will treat with doxy. -Pt given RTC or ED precautions including fever, chills, n/v, spreading of rash, etc. - Plan: hydrocortisone cream 1 %, doxycycline (VIBRA-TABS) 100 MG tablet  Vasculitis of skin  -referral placed urgent, however likely will not be seen  until Jan. -discussed close f/u.  RTC in 1 wk. - Plan: Ambulatory referral to Dermatology    Grier Mitts, MD

## 2017-09-23 ENCOUNTER — Encounter: Payer: Self-pay | Admitting: Family Medicine

## 2017-10-01 ENCOUNTER — Ambulatory Visit (INDEPENDENT_AMBULATORY_CARE_PROVIDER_SITE_OTHER): Payer: Medicare Other | Admitting: General Practice

## 2017-10-01 DIAGNOSIS — Z7901 Long term (current) use of anticoagulants: Secondary | ICD-10-CM

## 2017-10-01 DIAGNOSIS — I4891 Unspecified atrial fibrillation: Secondary | ICD-10-CM | POA: Diagnosis not present

## 2017-10-01 LAB — POCT INR: INR: 4.2

## 2017-10-01 NOTE — Patient Instructions (Addendum)
Pre visit review using our clinic review tool, if applicable. No additional management support is needed unless otherwise documented below in the visit note.  Skip dose today and tomorrow then change dosage and take 1 tablet daily except 1/2 tablet on Wednesdays.  Re-check in 4 weeks.

## 2017-10-11 ENCOUNTER — Encounter: Payer: Self-pay | Admitting: Family Medicine

## 2017-10-11 ENCOUNTER — Ambulatory Visit (INDEPENDENT_AMBULATORY_CARE_PROVIDER_SITE_OTHER): Payer: Medicare Other | Admitting: Family Medicine

## 2017-10-11 VITALS — BP 98/64 | HR 83 | Temp 97.7°F | Ht 72.0 in | Wt 241.2 lb

## 2017-10-11 DIAGNOSIS — H6121 Impacted cerumen, right ear: Secondary | ICD-10-CM | POA: Diagnosis not present

## 2017-10-11 DIAGNOSIS — J069 Acute upper respiratory infection, unspecified: Secondary | ICD-10-CM

## 2017-10-11 NOTE — Progress Notes (Signed)
HPI:  Acute visit for respiratory illness: -started: 5-7 days ago -symptoms:nasal congestion, pnd, R ear feels full, mild cough -denies:fever, SOB, NVD, tooth pain, body aches, sinus pain -has tried: musinex  -sick contacts/travel/risks: no reported flu, strep or tick exposure ROS: See pertinent positives and negatives per HPI.  Past Medical History:  Diagnosis Date  . Atrial fibrillation ([place])   . Diverticulitis   . Drug therapy    Anticoagulation Therapy  . Family history of diabetes insipidus   . FHx: colon cancer   . History of colonic polyps   . Hx of echocardiogram    a. Echo 5/11:  EF 55-60%, mild dilated ascending aorta, mod LAE  . Hyperlipidemia   . Hypertension   . Neoplasm of prostate    special screening malignant   . Obesity   . Palpitation     Past Surgical History:  Procedure Laterality Date  . 2-D Echocardiogram  November 2009, 2011  . Anal Fistula Repair    . CARDIAC VALVE SURGERY  1996   status post balloon valvuloplasty at [facility]  . CATARACT EXTRACTION  2004  . COLONOSCOPY  December 2005  . ETT  1991   Negative  . KNEE ARTHROSCOPY     Right  . TONSILLECTOMY      Family History  Problem Relation Age of Onset  . Heart attack Father   . Colon cancer Unknown   . Diabetes Unknown   . Prostate cancer Brother   . Heart disease Unknown   . Coronary artery disease Unknown        Two siblings, status post stenting to siblings with heart disease. One. Status post pacemaker insertion.  . Stroke Brother     Social History   Socioeconomic History  . Marital status: Married    Spouse name: None  . Number of children: None  . Years of education: None  . Highest education level: None  Social Needs  . Financial resource strain: None  . Food insecurity - worry: None  . Food insecurity - inability: None  . Transportation needs - medical: None  . Transportation needs - non-medical: None  Occupational History  . Occupation: Retired    Fish farm manager:  RETIRED  Tobacco Use  . Smoking status: Never Smoker  . Smokeless tobacco: Never Used  Substance and Sexual Activity  . Alcohol use: No  . Drug use: None  . Sexual activity: None  Other Topics Concern  . None  Social History Narrative   Regular exercise: Yes: [employer] 4 times/week     Current Outpatient Medications:  .  acetaminophen (TYLENOL) 500 MG tablet, Take 1,000 mg by mouth every 6 (six) hours as needed for pain. , Disp: , Rfl:  .  metoprolol tartrate (LOPRESSOR) 50 MG tablet, Take 1 tablet (50 mg) two times daily, Disp: 180 tablet, Rfl: 6 .  warfarin (COUMADIN) 5 MG tablet, TAKE AS DIRECTED BY  ANTICOAGULATION  CLINIC, Disp: 90 tablet, Rfl: 1  EXAM:  Vitals:   10/11/17 0936  BP: 98/64  Pulse: 83  Temp: 97.7 F (36.5 C)  SpO2: 96%    Body mass index is 32.71 kg/m.  GENERAL: vitals reviewed and listed above, alert, oriented, appears well hydrated and in no acute distress  HEENT: atraumatic, conjunttiva clear, no obvious abnormalities on inspection of external nose and ears, normal appearance of ear canals and TMs except cerumen impaction R, clear nasal congestion, mild post oropharyngeal erythema with PND, no tonsillar edema or exudate, no sinus  TTP  NECK: no obvious masses on inspection  LUNGS: clear to auscultation bilaterally, no wheezes, rales or rhonchi, good air movement  CV: irr irr, rate normal, no peripheral edema  MS: moves all extremities without noticeable abnormality  PSYCH: pleasant and cooperative, no obvious depression or anxiety  ASSESSMENT AND PLAN:  Discussed the following assessment and plan:  Viral upper respiratory illness  Impacted cerumen of right ear  -given HPI and exam findings today, a serious infection or illness is unlikely. We discussed potential etiologies, with VURI being most likely, and advised supportive care and monitoring. We discussed treatment side effects, likely course, antibiotic misuse, transmission, and signs of  developing a serious illness. -soft cerumen removed from R ear canal with soft curet per pt preference, tol well, small amount remain on TM -of course, we advised to return or notify a doctor immediately if symptoms worsen or persist or new concerns arise.    Patient Instructions  INSTRUCTIONS FOR UPPER RESPIRATORY INFECTION:  -can try debrox ear drops for the wax in the Right ear as we discussed  -plenty of rest and fluids  -nasal saline wash 2-3 times daily (use prepackaged nasal saline or bottled/distilled water if making your own)   -in the winter time, using a humidifier at night is helpful (please follow cleaning instructions)  -if you are taking a cough medication - use only as directed, may also try a teaspoon of honey to coat the throat and throat lozenges.   -for sore throat, salt water gargles can help  -follow up if you have fevers, facial pain, tooth pain, difficulty breathing or are worsening or symptoms persist longer then expected  Upper Respiratory Infection, Adult An upper respiratory infection (URI) is also known as the common cold. It is often caused by a type of germ (virus). Colds are easily spread (contagious). You can pass it to others by kissing, coughing, sneezing, or drinking out of the same glass. Usually, you get better in 1 to 3  weeks.  However, the cough can last for even longer. HOME CARE   Only take medicine as told by your doctor. Follow instructions provided above.  Drink enough water and fluids to keep your pee (urine) clear or pale yellow.  Get plenty of rest.  Return to work when your temperature is < 100 for 24 hours or as told by your doctor. You may use a face mask and wash your hands to stop your cold from spreading. GET HELP RIGHT AWAY IF:   After the first few days, you feel you are getting worse.  You have questions about your medicine.  You have chills, shortness of breath, or red spit (mucus).  You have pain in the face for  more then 1-2 days, especially when you bend forward.  You have a fever, puffy (swollen) neck, pain when you swallow, or white spots in the back of your throat.  You have a bad headache, ear pain, sinus pain, or chest pain.  You have a high-pitched whistling sound when you breathe in and out (wheezing).  You cough up blood.  You have sore muscles or a stiff neck. MAKE SURE YOU:   Understand these instructions.  Will watch your condition.  Will get help right away if you are not doing well or get worse. Document Released: 03/06/2008 Document Revised: 12/11/2011 Document Reviewed: 12/24/2013 [facility] Patient Information 2015 [place], [place]. This information is not intended to replace advice given to you by your health care provider. Make  sure you discuss any questions you have with your health care provider.    Colin Benton R., DO

## 2017-10-11 NOTE — Patient Instructions (Addendum)
INSTRUCTIONS FOR UPPER RESPIRATORY INFECTION:  -can try debrox ear drops for the wax in the Right ear as we discussed  -plenty of rest and fluids  -nasal saline wash 2-3 times daily (use prepackaged nasal saline or bottled/distilled water if making your own)   -in the winter time, using a humidifier at night is helpful (please follow cleaning instructions)  -if you are taking a cough medication - use only as directed, may also try a teaspoon of honey to coat the throat and throat lozenges.   -for sore throat, salt water gargles can help  -follow up if you have fevers, facial pain, tooth pain, difficulty breathing or are worsening or symptoms persist longer then expected  Upper Respiratory Infection, Adult An upper respiratory infection (URI) is also known as the common cold. It is often caused by a type of germ (virus). Colds are easily spread (contagious). You can pass it to others by kissing, coughing, sneezing, or drinking out of the same glass. Usually, you get better in 1 to 3  weeks.  However, the cough can last for even longer. HOME CARE   Only take medicine as told by your doctor. Follow instructions provided above.  Drink enough water and fluids to keep your pee (urine) clear or pale yellow.  Get plenty of rest.  Return to work when your temperature is < 100 for 24 hours or as told by your doctor. You may use a face mask and wash your hands to stop your cold from spreading. GET HELP RIGHT AWAY IF:   After the first few days, you feel you are getting worse.  You have questions about your medicine.  You have chills, shortness of breath, or red spit (mucus).  You have pain in the face for more then 1-2 days, especially when you bend forward.  You have a fever, puffy (swollen) neck, pain when you swallow, or white spots in the back of your throat.  You have a bad headache, ear pain, sinus pain, or chest pain.  You have a high-pitched whistling sound when you breathe in  and out (wheezing).  You cough up blood.  You have sore muscles or a stiff neck. MAKE SURE YOU:   Understand these instructions.  Will watch your condition.  Will get help right away if you are not doing well or get worse. Document Released: 03/06/2008 Document Revised: 12/11/2011 Document Reviewed: 12/24/2013 [facility] Patient Information 2015 [place], [place]. This information is not intended to replace advice given to you by your health care provider. Make sure you discuss any questions you have with your health care provider.

## 2017-10-29 ENCOUNTER — Ambulatory Visit (INDEPENDENT_AMBULATORY_CARE_PROVIDER_SITE_OTHER): Payer: Medicare Other | Admitting: General Practice

## 2017-10-29 DIAGNOSIS — Z7901 Long term (current) use of anticoagulants: Secondary | ICD-10-CM

## 2017-10-29 LAB — POCT INR: INR: 2.4

## 2017-10-29 NOTE — Patient Instructions (Addendum)
Pre visit review using our clinic review tool, if applicable. No additional management support is needed unless otherwise documented below in the visit note.  Continue to take 1 tablet daily  except 1/2 tablet on Wednesdays.  Re-check in 4 weeks. 

## 2017-11-26 ENCOUNTER — Ambulatory Visit (INDEPENDENT_AMBULATORY_CARE_PROVIDER_SITE_OTHER): Payer: Medicare Other | Admitting: General Practice

## 2017-11-26 DIAGNOSIS — Z7901 Long term (current) use of anticoagulants: Secondary | ICD-10-CM

## 2017-11-26 LAB — POCT INR: INR: 2.4

## 2017-11-26 NOTE — Patient Instructions (Addendum)
Pre visit review using our clinic review tool, if applicable. No additional management support is needed unless otherwise documented below in the visit note.  Continue to take 1 tablet daily  except 1/2 tablet on Wednesdays.  Re-check in 4 weeks. 

## 2017-12-10 ENCOUNTER — Telehealth: Payer: Self-pay | Admitting: Cardiovascular Disease

## 2017-12-10 NOTE — Telephone Encounter (Signed)
Pt would like to transfer from Dr Claiborne Billings and would like to be a patient of Dr Oval Linsey please.

## 2017-12-10 NOTE — Telephone Encounter (Signed)
OK by me 

## 2017-12-11 NOTE — Telephone Encounter (Signed)
OK 

## 2017-12-12 NOTE — Telephone Encounter (Signed)
Message sent to Dr Blenda Mounts scheduler to arrange appointment

## 2017-12-20 ENCOUNTER — Ambulatory Visit (INDEPENDENT_AMBULATORY_CARE_PROVIDER_SITE_OTHER): Payer: Medicare Other | Admitting: Cardiovascular Disease

## 2017-12-20 VITALS — BP 114/80 | HR 97 | Ht 72.0 in | Wt 235.0 lb

## 2017-12-20 DIAGNOSIS — I482 Chronic atrial fibrillation, unspecified: Secondary | ICD-10-CM

## 2017-12-20 DIAGNOSIS — R198 Other specified symptoms and signs involving the digestive system and abdomen: Secondary | ICD-10-CM | POA: Diagnosis not present

## 2017-12-20 DIAGNOSIS — I10 Essential (primary) hypertension: Secondary | ICD-10-CM | POA: Diagnosis not present

## 2017-12-20 DIAGNOSIS — E782 Mixed hyperlipidemia: Secondary | ICD-10-CM

## 2017-12-20 DIAGNOSIS — Z7901 Long term (current) use of anticoagulants: Secondary | ICD-10-CM | POA: Diagnosis not present

## 2017-12-20 DIAGNOSIS — R609 Edema, unspecified: Secondary | ICD-10-CM | POA: Diagnosis not present

## 2017-12-20 MED ORDER — DILTIAZEM HCL ER COATED BEADS 120 MG PO CP24: 120.0000 mg | ORAL_CAPSULE | Freq: Every day | ORAL | 3 refills | Status: DC

## 2017-12-20 MED ORDER — METOPROLOL TARTRATE 25 MG PO TABS: 25.0000 mg | ORAL_TABLET | Freq: Two times a day (BID) | ORAL | 3 refills | Status: DC

## 2017-12-20 NOTE — Progress Notes (Signed)
Primary MD: Dr. Bluford Kaufmann  PATIENT PROFILE: Glen [name] is a 84 y.o. [name] who is a former patient of Dr. Mare Ferrari who established care with me in September 2017.  He presents for six-month follow-up cardiology evaluation.  HPI:  Glen [name] has a history of permanent AF, hypertension and hyperlipidemia. His AF has always been treated with rate control and he neveer had an attempt of cardioversion. He is on coumadin anticoagulation with a cha2ds2vasc score of 3. In the past he was treated with diltiazem and atenolol. He had gingival disease and was taken off diltiazem. Most recently he has been on metoprolol 50 mg bid for rate control but had reduced this to 25 mg bid since he felt that it had contributed to edema  He denies any awareness of sleep apnea. He does not exercise due to knee issues and gets injections into his kness intermittently. He last saw Dr. Mare Ferrari in 11/2015 and complained that his feet :were puffy on the bottom."   When I saw him, I did not feel that the metoprolol was contributing to his feet discomfort.  He had been switched to act to atenolol and most recently is now on metoprolol 50 mg in the morning and 25 mg, which she has been taking the early afternoon per Dr. Burnice Logan.  He continues to be on warfarin anticoagulation.  He continues to experience "puffiness "of his ankles.  At times he admits to eating food with increase sodium.  He denied chest pain, PND, orthopnea.  He denied bleeding.   Since I last saw him he admits to having periods of diarrhea as well as constipation.  He also is noticed increased eructation.   He was concerned that his symptoms are related to the metoprolol.  He is on metoprolol for rate control of his atrial fibrillation.  Remotely when he was seen by Dr. Mare Ferrari.  He was told possibly that Cardizem caused gingival hypertrophy by his dentist.  The patient would like to be retried on diltiazem and noted that he still had some  gum issues for many years off the Cardizem prescription.  He denies any episodes of chest pressure.  He has never been evaluated for possible irritable bowel syndrome.  He presents for evaluation.  Past Medical History:  Diagnosis Date  . Atrial fibrillation ([place])   . Diverticulitis   . Drug therapy    Anticoagulation Therapy  . Family history of diabetes insipidus   . FHx: colon cancer   . History of colonic polyps   . Hx of echocardiogram    a. Echo 5/11:  EF 55-60%, mild dilated ascending aorta, mod LAE  . Hyperlipidemia   . Hypertension   . Neoplasm of prostate    special screening malignant   . Obesity   . Palpitation     Past Surgical History:  Procedure Laterality Date  . 2-D Echocardiogram  November 2009, 2011  . Anal Fistula Repair    . CARDIAC VALVE SURGERY  1996   status post balloon valvuloplasty at [facility]  . CATARACT EXTRACTION  2004  . COLONOSCOPY  December 2005  . ETT  1991   Negative  . KNEE ARTHROSCOPY     Right  . TONSILLECTOMY      Allergies  Allergen Reactions  . Codeine Other (See Comments)    constpation    Current Outpatient Medications  Medication Sig Dispense Refill  . acetaminophen (TYLENOL) 500 MG tablet Take 1,000 mg by mouth  every 6 (six) hours as needed for pain.     Marland Kitchen diltiazem (CARDIZEM CD) 120 MG 24 hr capsule Take 1 capsule (120 mg total) by mouth at bedtime. 90 capsule 3  . metoprolol tartrate (LOPRESSOR) 25 MG tablet Take 1 tablet (25 mg total) by mouth 2 (two) times daily. 180 tablet 3  . warfarin (COUMADIN) 5 MG tablet TAKE AS DIRECTED BY  ANTICOAGULATION  CLINIC 90 tablet 1   No current facility-administered medications for this visit.     Social History   Socioeconomic History  . Marital status: Married    Spouse name: Not on file  . Number of children: Not on file  . Years of education: Not on file  . Highest education level: Not on file  Occupational History  . Occupation: Retired    Fish farm manager: RETIRED  Social Needs   . Financial resource strain: Not on file  . Food insecurity:    Worry: Not on file    Inability: Not on file  . Transportation needs:    Medical: Not on file    Non-medical: Not on file  Tobacco Use  . Smoking status: Never Smoker  . Smokeless tobacco: Never Used  Substance and Sexual Activity  . Alcohol use: No  . Drug use: Not on file  . Sexual activity: Not on file  Lifestyle  . Physical activity:    Days per week: Not on file    Minutes per session: Not on file  . Stress: Not on file  Relationships  . Social connections:    Talks on phone: Not on file    Gets together: Not on file    Attends religious service: Not on file    Active member of club or organization: Not on file    Attends meetings of clubs or organizations: Not on file    Relationship status: Not on file  . Intimate partner violence:    Fear of current or ex partner: Not on file    Emotionally abused: Not on file    Physically abused: Not on file    Forced sexual activity: Not on file  Other Topics Concern  . Not on file  Social History Narrative   Regular exercise: Yes: [employer] 4 times/week    Family History  Problem Relation Age of Onset  . Heart attack Father   . Colon cancer Unknown   . Diabetes Unknown   . Prostate cancer Brother   . Heart disease Unknown   . Coronary artery disease Unknown        Two siblings, status post stenting to siblings with heart disease. One. Status post pacemaker insertion.  . Stroke Brother     ROS General: Negative; No fevers, chills, or night sweats HEENT: Negative; No changes in vision or hearing, sinus congestion, difficulty swallowing Pulmonary: Negative; No cough, wheezing, shortness of breath, hemoptysis Cardiovascular:  See HPI; No chest pain, presyncope, syncope, palpitations, edema GI:.  Positive for increased gas, diarrhea as well as constipation GU: Negative; No dysuria, hematuria, or difficulty voiding Musculoskeletal: Negative; no myalgias, joint  pain, or weakness Hematologic/Oncologic: Negative; no easy bruising, bleeding Endocrine: Negative; no heat/cold intolerance; no diabetes Neuro: Negative; no changes in balance, headaches Skin: Negative; No rashes or skin lesions Psychiatric: Negative; No behavioral problems, depression Sleep: Negative; No daytime sleepiness, hypersomnolence, bruxism, restless legs, hypnogagnic hallucinations Other comprehensive 14 point system review is negative   Physical Exam BP 114/80   Pulse 97   Ht 6' (1.829 m)  Wt 235 lb (106.6 kg)   BMI 31.87 kg/m    Repeat blood pressure by me was 112/78  Wt Readings from Last 3 Encounters:  12/20/17 235 lb (106.6 kg)  10/11/17 241 lb 3.2 oz (109.4 kg)  07/31/17 240 lb 6.4 oz (109 kg)   General: Alert, oriented, no distress.  Skin: normal turgor, no rashes, warm and dry HEENT: Normocephalic, atraumatic. Pupils equal round and reactive to light; sclera anicteric; extraocular muscles intact; .  Poor dentition Nose without nasal septal hypertrophy Mouth/Parynx benign; Mallinpatti scale 3 Neck: No JVD, no carotid bruits; normal carotid upstroke Lungs: clear to ausculatation and percussion; no wheezing or rales Chest wall: without tenderness to palpitation Heart: PMI not displaced,irregularly irregular with a ventricular rate in the 90s, s1 s2 normal, 1/6 systolic murmur, no diastolic murmur, no rubs, gallops, thrills, or heaves Abdomen: soft, nontender; no hepatosplenomehaly, BS+; abdominal aorta nontender and not dilated by palpation. Back: no CVA tenderness Pulses 2+ Musculoskeletal: full range of motion, normal strength, no joint deformities Extremities: no clubbing cyanosis or edema, Homan's sign negative  Neurologic: grossly nonfocal; Cranial nerves grossly wnl Psychologic: Normal mood and affect   ECG (independently read by me): Atrial fibrillation with ventricular rate at 97 bpm.  Small Q wave in 3.  Low voltage.  September 2018ECG  (independently read by me): Atrial fibrillation with ventricular rate at approximately 100 bpm.  Q wave in aVF; low voltage.  Early transition.  No significant ST changes  March 2018 ECG (independently read by me): Atrial fibrillation with ventricular rate at 104 bpm.  QTc interval 433 ms.  September 2017 ECG (independently read by me): AF at 106 without significant STT changes.  LABS:  BMP Latest Ref Rng & Units 07/31/2017 07/14/2016 06/16/2016  Glucose 70 - 99 mg/dL 103(H) 101(H) 100(H)  BUN 6 - 23 mg/dL _0 Creatinine 0.40 - 1.50 mg/dL 1.09 1.25(H) 1.25(H)  Sodium 135 - 145 mEq/L 140 138 141  Potassium 3.5 - 5.1 mEq/L 4.7 4.8 4.8  Chloride 96 - 112 mEq/L 104 104 105  CO2 19 - 32 mEq/L _1 Calcium 8.4 - 10.5 mg/dL 9.6 9.1 9.1     Hepatic Function Latest Ref Rng & Units 07/31/2017 07/14/2016 06/16/2016  Total Protein 6.0 - 8.3 g/dL 6.5 6.5 6.5  Albumin 3.5 - 5.2 g/dL 4.1 4.0 4.1  AST 0 - 37 U/L _2 ALT 0 - 53 U/L _3 Alk Phosphatase 39 - 117 U/L 83 67 69  Total Bilirubin 0.2 - 1.2 mg/dL 0.6 0.8 0.8  Bilirubin, Direct 0.0 - 0.3 mg/dL - - -    CBC Latest Ref Rng & Units 07/31/2017 06/16/2016 11/18/2015  WBC 4.0 - 10.5 K/uL 7.4 8.8 8.8  Hemoglobin 13.0 - 17.0 g/dL 17.0 17.5(H) 17.0  Hematocrit 39.0 - 52.0 % 50.6 51.8(H) 49.9  Platelets 150.0 - 400.0 K/uL 210.0 203 206   Lab Results  Component Value Date   MCV 96.8 07/31/2017   MCV 95.2 06/16/2016   MCV 94.5 11/18/2015   Lab Results  Component Value Date   TSH 2.95 07/31/2017   Lab Results  Component Value Date   HGBA1C 5.9 07/31/2017    MG 2.1  BNP    Component Value Date/Time   BNP 27.4 11/18/2015 1024    ProBNP No results found for: PROBNP   Lipid Panel     Component Value Date/Time   CHOL 210 (H) 06/16/2016 0946  TRIG 215 (H) 06/16/2016 0946   TRIG 150 (H) 09/06/2006 1031   HDL 42 06/16/2016 0946   CHOLHDL 5.0 06/16/2016 0946   VLDL 43 (H) 06/16/2016 0946   LDLCALC 125  06/16/2016 0946   LDLDIRECT 126.0 04/19/2015 1000    RADIOLOGY: No results found.   IMPRESSION:  1. Chronic atrial fibrillation ([place])   2. Peripheral edema   3. Anticoagulation adequate   4. Essential hypertension   5. Mixed hyperlipidemia   6. Alternating constipation and diarrhea     ASSESSMENT AND PLAN: Glen [name] is an 84 year old Glen [name] who has a history of hypertension, hyperlidemia and permanent AF on coumadin anticoagulation.  His last echo in 3/2016was unchanged from a prior echo of 2011, and ejection fraction remained normal at 55-60%.  His left atrium was moderately dilated which was probably contributed by his atrial fibrillation. He continues to be on warfarin for anticoagulation with INH goal of 2.5. When I saw the patient in March 2018 , I had recommended titration of metoprolol for improved rate control and subsequently increase this to 50 mg twice a day at his last office visit.he has had some dependent rubor with mild edema independently and I suspect this may be contributed by mild venous insufficiency.  He was given a prescription for HCTZ, which he has not taken.I also recommended support stockings.  He now would like to be taken off metoprolol since he believes this is contributing to diarrhea, constipation, and burping.  On his allergies is listed diltiazem leading to gingival hyperplasia.  The patient feels strongly that he would like a rechallenge of diltiazem.  Presently, I will resume Cardizem at a low dose of 120, now grams at bedtime and I will reduce his metoprolol down to 25 mg twice a day.  I have also suggested the possibility of a GI evaluation an alternating diarrhea and constipation may be a result of possible internal bowel syndrome.  I also suggested Gas-X to help with his abdominal bloating and belching.  Review of remote laboratory from 2017 that showed an elevated LDL cholesterol 125.  Total cholesterol 210.  I would recommend follow-up lipid  studies.  He will return to Dr. Burnice Logan for his primary care.  I will see him in 4 months for follow-up cardiology evaluation.  Time spent: 25 minutes  Glen Sine, MD, [facility] 12/21/2017 6:08 PM

## 2017-12-20 NOTE — Patient Instructions (Signed)
Medication Instructions:  DECREASE metoprolol tartrate (Lopressor) to 25 mg two times daily  START diltiazem (Cardizem CD) 120 mg daily at bedtime  Follow-Up: Your physician wants you to follow-up in: 4 months with Dr. Claiborne Billings. You will receive a reminder letter in the mail two months in advance. If you don't receive a letter, please call our office to schedule the follow-up appointment.   Any Other Special Instructions Will Be Listed Below (If Applicable).  Try over the counter Gas X for burping   If you need a refill on your cardiac medications before your next appointment, please call your pharmacy.

## 2017-12-21 ENCOUNTER — Telehealth: Payer: Self-pay | Admitting: *Deleted

## 2017-12-21 ENCOUNTER — Encounter: Payer: Self-pay | Admitting: Cardiovascular Disease

## 2017-12-21 NOTE — Telephone Encounter (Signed)
-----   Message from Romana Juniper sent at 12/21/2017 11:09 AM EDT ----- Sheralyn Boatman ,  Damaris Schooner w/ Mr. Cobbins and he said he is going to stay with Dr. Claiborne Billings .  Dalene Seltzer  ----- Message ----- From: Earvin Hansen Sent: 12/12/2017  12:44 PM To: Gala Murdoch  This patient wants to change from [place] to [place], they have both ok'd see below  Skeet Latch, MD at 12/11/2017 5:52 PM  Status: Signed  OK.  Troy Sine, MD at 12/10/2017 6:28 PM  Status: Signed  OK by me  Glyn Ade at 12/10/2017 9:03 AM  Status: Signed Pt would like to transfer from Dr Claiborne Billings and would like to be a patient of Dr Oval Linsey please.  Please call for appointment  Thanks [facility]

## 2017-12-24 ENCOUNTER — Ambulatory Visit (INDEPENDENT_AMBULATORY_CARE_PROVIDER_SITE_OTHER): Payer: Medicare Other | Admitting: General Practice

## 2017-12-24 DIAGNOSIS — Z7901 Long term (current) use of anticoagulants: Secondary | ICD-10-CM

## 2017-12-24 LAB — POCT INR: INR: 3.2

## 2017-12-24 NOTE — Patient Instructions (Addendum)
Pre visit review using our clinic review tool, if applicable. No additional management support is needed unless otherwise documented below in the visit note.  Skip coumadin today and then continue to take 1 tablet daily except 1/2 tablet on Wednesdays.  Re-check in 4 weeks.

## 2018-01-16 ENCOUNTER — Telehealth: Payer: Self-pay | Admitting: Internal Medicine

## 2018-01-16 NOTE — Telephone Encounter (Signed)
Copied from [place] [id number]. Topic: Quick Communication - See Telephone Encounter >> Jan 16, 2018  4:50 PM Vernona Rieger wrote: CRM for notification. See Telephone encounter for: 01/16/18.  Dr Romie Minus, DDS called and said that the patient will be having four teeth extracted. He noticed that he is currently on warfarin (COUMADIN) 5 MG tablet. Dr Romie Minus wants to know what Dr. Marthann Schiller recommendation's are for warfarin (COUMADIN) 5 MG tablet. Please call back at [phone]

## 2018-01-17 ENCOUNTER — Encounter: Payer: Self-pay | Admitting: Internal Medicine

## 2018-01-17 NOTE — Telephone Encounter (Signed)
Recommend the patient hold Coumadin anticoagulation 4 days prior to dental extractions and resume 1 day following dental extractions; no bridging with heparin therapy required

## 2018-01-17 NOTE — Telephone Encounter (Signed)
I called Dr.Rehm's office and was told that the patient does not have an appointment set up yet waiting for the concerns to be answered regarding the coumadin. Routing to [facility] for advise.

## 2018-01-21 ENCOUNTER — Ambulatory Visit (INDEPENDENT_AMBULATORY_CARE_PROVIDER_SITE_OTHER): Payer: Medicare Other | Admitting: General Practice

## 2018-01-21 DIAGNOSIS — Z7901 Long term (current) use of anticoagulants: Secondary | ICD-10-CM

## 2018-01-21 LAB — POCT INR: INR: 3.3

## 2018-01-21 NOTE — Patient Instructions (Addendum)
Pre visit review using our clinic review tool, if applicable. No additional management support is needed unless otherwise documented below in the visit note.  Skip coumadin today and then change dosage and take 1 tablet daily except nothing on Wednesdays.  Re-check in 4 weeks.

## 2018-01-21 NOTE — Telephone Encounter (Signed)
Dr.Rehm office informed of update. No further action needed.

## 2018-01-29 ENCOUNTER — Encounter: Payer: Self-pay | Admitting: Internal Medicine

## 2018-01-29 ENCOUNTER — Ambulatory Visit (INDEPENDENT_AMBULATORY_CARE_PROVIDER_SITE_OTHER): Payer: Medicare Other | Admitting: Internal Medicine

## 2018-01-29 VITALS — BP 100/60 | HR 91 | Temp 97.7°F | Wt 235.0 lb

## 2018-01-29 DIAGNOSIS — I10 Essential (primary) hypertension: Secondary | ICD-10-CM

## 2018-01-29 DIAGNOSIS — L989 Disorder of the skin and subcutaneous tissue, unspecified: Secondary | ICD-10-CM | POA: Diagnosis not present

## 2018-01-29 DIAGNOSIS — I480 Paroxysmal atrial fibrillation: Secondary | ICD-10-CM | POA: Diagnosis not present

## 2018-01-29 DIAGNOSIS — E785 Hyperlipidemia, unspecified: Secondary | ICD-10-CM

## 2018-01-29 DIAGNOSIS — R7302 Impaired glucose tolerance (oral): Secondary | ICD-10-CM

## 2018-01-29 NOTE — Progress Notes (Signed)
Subjective:    Patient ID: Glen [name], male    DOB: 1928-01-12, 84 y.o.   MRN: [id number]  HPI  84 year old patient who is seen today for follow-up.  He is followed by cardiology quarterly with a history of chronic atrial fibrillation.  He remains on chronic Coumadin anticoagulation.  He has essential hypertension history of impaired glucose tolerance and obesity.  He is doing well except for some chronic mild pedal edema. No cardiopulmonary complaints. Hemoglobin A1c in the fall was normal  Past Medical History:  Diagnosis Date  . Atrial fibrillation ([place])   . Diverticulitis   . Drug therapy    Anticoagulation Therapy  . Family history of diabetes insipidus   . FHx: colon cancer   . History of colonic polyps   . Hx of echocardiogram    a. Echo 5/11:  EF 55-60%, mild dilated ascending aorta, mod LAE  . Hyperlipidemia   . Hypertension   . Neoplasm of prostate    special screening malignant   . Obesity   . Palpitation      Social History   Socioeconomic History  . Marital status: Married    Spouse name: Not on file  . Number of children: Not on file  . Years of education: Not on file  . Highest education level: Not on file  Occupational History  . Occupation: Retired    Fish farm manager: RETIRED  Social Needs  . Financial resource strain: Not on file  . Food insecurity:    Worry: Not on file    Inability: Not on file  . Transportation needs:    Medical: Not on file    Non-medical: Not on file  Tobacco Use  . Smoking status: Never Smoker  . Smokeless tobacco: Never Used  Substance and Sexual Activity  . Alcohol use: No  . Drug use: Not on file  . Sexual activity: Not on file  Lifestyle  . Physical activity:    Days per week: Not on file    Minutes per session: Not on file  . Stress: Not on file  Relationships  . Social connections:    Talks on phone: Not on file    Gets together: Not on file    Attends religious service: Not on file    Active member of  club or organization: Not on file    Attends meetings of clubs or organizations: Not on file    Relationship status: Not on file  . Intimate partner violence:    Fear of current or ex partner: Not on file    Emotionally abused: Not on file    Physically abused: Not on file    Forced sexual activity: Not on file  Other Topics Concern  . Not on file  Social History Narrative   Regular exercise: Yes: [employer] 4 times/week    Past Surgical History:  Procedure Laterality Date  . 2-D Echocardiogram  November 2009, 2011  . Anal Fistula Repair    . CARDIAC VALVE SURGERY  1996   status post balloon valvuloplasty at [facility]  . CATARACT EXTRACTION  2004  . COLONOSCOPY  December 2005  . ETT  1991   Negative  . KNEE ARTHROSCOPY     Right  . TONSILLECTOMY      Family History  Problem Relation Age of Onset  . Heart attack Father   . Colon cancer Unknown   . Diabetes Unknown   . Prostate cancer Brother   . Heart disease Unknown   .  Coronary artery disease Unknown        Two siblings, status post stenting to siblings with heart disease. One. Status post pacemaker insertion.  . Stroke Brother     Allergies  Allergen Reactions  . Codeine Other (See Comments)    constpation    Current Outpatient Medications on File Prior to Visit  Medication Sig Dispense Refill  . acetaminophen (TYLENOL) 500 MG tablet Take 1,000 mg by mouth every 6 (six) hours as needed for pain.     Marland Kitchen diltiazem (CARDIZEM CD) 120 MG 24 hr capsule Take 1 capsule (120 mg total) by mouth at bedtime. 90 capsule 3  . metoprolol tartrate (LOPRESSOR) 25 MG tablet Take 1 tablet (25 mg total) by mouth 2 (two) times daily. 180 tablet 3  . warfarin (COUMADIN) 5 MG tablet TAKE AS DIRECTED BY  ANTICOAGULATION  CLINIC 90 tablet 1   No current facility-administered medications on file prior to visit.     BP 100/60 (BP Location: Right Arm, Patient Position: Sitting, Cuff Size: Large)   Pulse 91   Temp 97.7 F (36.5 C) (Oral)   Wt  235 lb (106.6 kg)   SpO2 95%   BMI 31.87 kg/m     Review of Systems  Constitutional: Negative for appetite change, chills, fatigue and fever.  HENT: Negative for congestion, dental problem, ear pain, hearing loss, sore throat, tinnitus, trouble swallowing and voice change.   Eyes: Negative for pain, discharge and visual disturbance.  Respiratory: Negative for cough, chest tightness, wheezing and stridor.   Cardiovascular: Positive for leg swelling. Negative for chest pain and palpitations.  Gastrointestinal: Negative for abdominal distention, abdominal pain, blood in stool, constipation, diarrhea, nausea and vomiting.  Genitourinary: Negative for difficulty urinating, discharge, flank pain, genital sores, hematuria and urgency.  Musculoskeletal: Negative for arthralgias, back pain, gait problem, joint swelling, myalgias and neck stiffness.  Skin: Positive for wound. Negative for rash.  Neurological: Negative for dizziness, syncope, speech difficulty, weakness, numbness and headaches.  Hematological: Negative for adenopathy. Does not bruise/bleed easily.  Psychiatric/Behavioral: Negative for behavioral problems and dysphoric mood. The patient is not nervous/anxious.        Objective:   Physical Exam  Constitutional: He is oriented to person, place, and time. He appears well-developed.    Weight 235 Blood pressure 100/70  HENT:  Head: Normocephalic.  Right Ear: External ear normal.  Left Ear: External ear normal.  Eyes: Conjunctivae and EOM are normal.  Neck: Normal range of motion.  Cardiovascular: Normal rate and normal heart sounds.  Controlled ventricular response  Pulmonary/Chest: He has rales.   at bibasilar crackles  Abdominal: Bowel sounds are normal.  Musculoskeletal: Normal range of motion. He exhibits no edema or tenderness.  Trace pedal edema  Neurological: He is alert and oriented to person, place, and time.  Skin:  Lesion right Mallar area with scarring and  some telangiectasia consistent with Abcde  Psychiatric: He has a normal mood and affect. His behavior is normal.          Assessment & Plan:   Chronic atrial fibrillation.  Continue rate controlled on chronic anticoagulation Hypertension well-controlled Rule out [place] right facial area.  Will refer to dermatology Obesity weight loss encouraged  CPX 6 months  Nyoka Cowden

## 2018-01-29 NOTE — Patient Instructions (Signed)
Limit your sodium (Salt) intake    It is important that you exercise regularly, at least 20 minutes 3 to 4 times per week.  If you develop chest pain or shortness of breath seek  medical attention.  You need to lose weight.  Consider a lower calorie diet and regular exercise.  Dermatology consultation  Return in 6 months for follow-up

## 2018-01-31 ENCOUNTER — Encounter: Payer: Self-pay | Admitting: Cardiovascular Disease

## 2018-02-18 ENCOUNTER — Ambulatory Visit (INDEPENDENT_AMBULATORY_CARE_PROVIDER_SITE_OTHER): Payer: Medicare Other | Admitting: General Practice

## 2018-02-18 DIAGNOSIS — Z7901 Long term (current) use of anticoagulants: Secondary | ICD-10-CM

## 2018-02-18 LAB — POCT INR: INR: 2.2

## 2018-02-18 NOTE — Patient Instructions (Addendum)
Pre visit review using our clinic review tool, if applicable. No additional management support is needed unless otherwise documented below in the visit note.  Continue to take 1 tablet daily except nothing on Wednesdays.  Re-check in 4 weeks.  

## 2018-02-20 DIAGNOSIS — L821 Other seborrheic keratosis: Secondary | ICD-10-CM | POA: Diagnosis not present

## 2018-02-20 DIAGNOSIS — L738 Other specified follicular disorders: Secondary | ICD-10-CM | POA: Diagnosis not present

## 2018-02-20 DIAGNOSIS — D485 Neoplasm of uncertain behavior of skin: Secondary | ICD-10-CM | POA: Diagnosis not present

## 2018-02-22 ENCOUNTER — Encounter: Payer: Self-pay | Admitting: Family Medicine

## 2018-02-26 ENCOUNTER — Ambulatory Visit (INDEPENDENT_AMBULATORY_CARE_PROVIDER_SITE_OTHER): Payer: Medicare Other | Admitting: Family Medicine

## 2018-02-26 ENCOUNTER — Encounter: Payer: Self-pay | Admitting: Family Medicine

## 2018-02-26 DIAGNOSIS — R609 Edema, unspecified: Secondary | ICD-10-CM

## 2018-02-26 DIAGNOSIS — I480 Paroxysmal atrial fibrillation: Secondary | ICD-10-CM

## 2018-02-26 MED ORDER — DILTIAZEM HCL ER COATED BEADS 120 MG PO CP24: 120.0000 mg | ORAL_CAPSULE | Freq: Every day | ORAL | 3 refills | Status: DC

## 2018-02-26 MED ORDER — WARFARIN SODIUM 5 MG PO TABS: ORAL_TABLET | ORAL | 1 refills | Status: DC

## 2018-02-26 NOTE — Assessment & Plan Note (Signed)
Likely multifactorial in setting of diltiazem and venous insufficiency.  No red flag signs or symptoms.  Discussed conservative measures including frequent leg elevation, compression stockings, and low-salt diet.  He has tried diuretics in the past but did not tolerate due to side effects.  We will not start any medications today.

## 2018-02-26 NOTE — Assessment & Plan Note (Signed)
Seems to be in sinus rhythm today.  Continue Cardizem, metoprolol, and warfarin.

## 2018-02-26 NOTE — Patient Instructions (Signed)
It was very nice seeing you today!  Please keep up the good work!  For your leg swelling, please keep your legs elevated, avoid salt and use compression.  No other medication changes today.  Come back to for your annual wellness visit in October, or come back to see me sooner as needed.  Take care, Dr [name]

## 2018-02-26 NOTE — Telephone Encounter (Signed)
Pt had appointment 5/28 at 10am

## 2018-02-26 NOTE — Progress Notes (Signed)
   Subjective:  Glen [name] is a 84 y.o. male who presents today with a chief complaint of leg edema and to transfer care to this office.  HPI:  Leg Edema, new problem Started a couple years ago. Stable over that time.  Tried compression stockings once in the past with no improvement.  Tries to keep his legs elevated.  Afib, chronic problem, stable Currently on diltiazem 100 mg daily, metoprolol tartrate 25 mg twice daily, and anticoagulated with warfarin.  Tolerates all these meds well without side effects.  ROS: Per HPI  PMH: He reports that he has never smoked. He has never used smokeless tobacco. He reports that he does not drink alcohol. His drug history is not on file.  Objective:  Physical Exam: BP 114/64 (BP Location: Left Arm, Patient Position: Sitting, Cuff Size: Normal)   Pulse 99   Temp 98.3 F (36.8 C) (Oral)   Ht 6' (1.829 m)   Wt 237 lb (107.5 kg)   SpO2 96%   BMI 32.14 kg/m   Gen: NAD, resting comfortably CV: RRR with no murmurs appreciated Pulm: NWOB, CTAB with no crackles, wheezes, or rhonchi MSK: 1+ pitting edema to midshin bilaterally.  Assessment/Plan:  Peripheral edema Likely multifactorial in setting of diltiazem and venous insufficiency.  No red flag signs or symptoms.  Discussed conservative measures including frequent leg elevation, compression stockings, and low-salt diet.  He has tried diuretics in the past but did not tolerate due to side effects.  We will not start any medications today.  Atrial fibrillation Seems to be in sinus rhythm today.  Continue Cardizem, metoprolol, and warfarin.  Preventative healthcare Patient will follow-up in about 5 months for his annual wellness visit.  [name]. [name], MD 02/26/2018 12:03 PM

## 2018-03-07 DIAGNOSIS — D485 Neoplasm of uncertain behavior of skin: Secondary | ICD-10-CM | POA: Diagnosis not present

## 2018-03-12 DIAGNOSIS — C4491 Basal cell carcinoma of skin, unspecified: Secondary | ICD-10-CM | POA: Diagnosis not present

## 2018-03-18 ENCOUNTER — Ambulatory Visit (INDEPENDENT_AMBULATORY_CARE_PROVIDER_SITE_OTHER): Payer: Medicare Other | Admitting: General Practice

## 2018-03-18 DIAGNOSIS — Z7901 Long term (current) use of anticoagulants: Secondary | ICD-10-CM

## 2018-03-18 LAB — POCT INR: INR: 2.4 (ref 2.0–3.0)

## 2018-03-18 NOTE — Patient Instructions (Addendum)
Pre visit review using our clinic review tool, if applicable. No additional management support is needed unless otherwise documented below in the visit note.  Continue to take 1 tablet daily except nothing on Wednesdays.  Re-check in 4 weeks.  

## 2018-04-08 DIAGNOSIS — L821 Other seborrheic keratosis: Secondary | ICD-10-CM | POA: Diagnosis not present

## 2018-04-08 DIAGNOSIS — C4491 Basal cell carcinoma of skin, unspecified: Secondary | ICD-10-CM | POA: Diagnosis not present

## 2018-04-08 DIAGNOSIS — R238 Other skin changes: Secondary | ICD-10-CM | POA: Diagnosis not present

## 2018-04-15 ENCOUNTER — Ambulatory Visit (INDEPENDENT_AMBULATORY_CARE_PROVIDER_SITE_OTHER): Payer: Medicare Other | Admitting: General Practice

## 2018-04-15 DIAGNOSIS — Z7901 Long term (current) use of anticoagulants: Secondary | ICD-10-CM

## 2018-04-15 LAB — POCT INR: INR: 2.4 (ref 2.0–3.0)

## 2018-04-15 NOTE — Patient Instructions (Addendum)
Pre visit review using our clinic review tool, if applicable. No additional management support is needed unless otherwise documented below in the visit note.  Continue to take 1 tablet daily except nothing on Wednesdays.  Re-check in 6 weeks.  

## 2018-04-18 ENCOUNTER — Telehealth: Payer: Self-pay | Admitting: Cardiovascular Disease

## 2018-04-18 NOTE — Telephone Encounter (Signed)
New Message   Pt states his heart is racing, he does not have a monitor to check his bp or hr and he wants to be seen today. Please call

## 2018-04-18 NOTE — Telephone Encounter (Signed)
Called patient back. Patient complaining of racing heart that is waking him up in the middle of the night. Patient stated he is really concerned about it. Patient stated he has been taking his medications as instructed. Patient stated it's consistent, but it is happening often enough to make him worry. Patient does not have a way to check his BP and HR at home. Patient wanted to be seen today, but there are no openings. Made patient an appointment with Dr. Harrell Gave for tomorrow. Encouraged patient if symptoms get worse to go to ED. Patient verbalized understanding and will go to ED if symptoms get worse. Patient stated at this time he does not feel he needs to go to ED. Will forward to Dr. Claiborne Billings and his nurse so they are aware.

## 2018-04-19 ENCOUNTER — Encounter: Payer: Self-pay | Admitting: Cardiology

## 2018-04-19 ENCOUNTER — Ambulatory Visit (INDEPENDENT_AMBULATORY_CARE_PROVIDER_SITE_OTHER): Payer: Medicare Other | Admitting: Cardiology

## 2018-04-19 VITALS — BP 112/64 | HR 121 | Ht 72.0 in | Wt 234.2 lb

## 2018-04-19 DIAGNOSIS — I4891 Unspecified atrial fibrillation: Secondary | ICD-10-CM | POA: Diagnosis not present

## 2018-04-19 DIAGNOSIS — Z79899 Other long term (current) drug therapy: Secondary | ICD-10-CM

## 2018-04-19 DIAGNOSIS — R002 Palpitations: Secondary | ICD-10-CM

## 2018-04-19 DIAGNOSIS — I482 Chronic atrial fibrillation, unspecified: Secondary | ICD-10-CM

## 2018-04-19 MED ORDER — METOPROLOL TARTRATE 50 MG PO TABS: 50.0000 mg | ORAL_TABLET | Freq: Two times a day (BID) | ORAL | 3 refills | Status: DC

## 2018-04-19 NOTE — Patient Instructions (Signed)
Medication Instructions: Your physician recommends that you Stop: Diltiazem 120 mg daily  Increase: Metoprolol 50 mg two times a day   If you need a refill on your cardiac medications before your next appointment, please call your pharmacy.   Labwork: Your physician recommends that you return for lab work in: Today TSH, BMP   Procedures/Testing: None  Follow-Up: Your physician wants you to follow-up in 1 month with Dr. Harrell Gave.   Special Instructions:    Thank you for choosing [facility] at [facility]!!

## 2018-04-19 NOTE — Progress Notes (Signed)
Cardiology Office Note:    Date:  04/19/2018   ID:  Glen [name], DOB 20-Aug-1928, MRN [id number]  PCP:  [name], MD  Cardiologist:  Dr. Claiborne Billings  Chief Complaint  Patient presents with  . Palpitations    History of Present Illness:    Glen [name] is a 84 y.o. male with a hx of hypertension, hyperlidemia and permanent AF on coumadin anticoagulation who is seen as an urgent visit for palpitations.  He last saw Dr. Claiborne Billings on 12/20/17, at which time he was switched from metoprolol to diltiazem at the patient's request.   Started noting palpitations over the last few weeks, then on 7/15 got worse--heart "shivering". He feels that it makes his whole body jittery. No chest pain or shortness of breath. He didn't want to come to the ER and preferred to be seen in the office. Denies fevers, chills, or recent illnesses. Taking diltiazem 120 mg CR and metoprolol tartrate 25 mg BID as prescribed. He notes that since changing from the higher dose metoprolol to the diltiazem/metoprolol combination he continues to have belching and lower extremity swelling but also worsening palpitations.  No chest pain or shortness of breath. No lightheadedness or syncope.  Past Medical History:  Diagnosis Date  . Atrial fibrillation ([place])   . Diverticulitis   . Drug therapy    Anticoagulation Therapy  . Family history of diabetes insipidus   . FHx: colon cancer   . History of colonic polyps   . Hx of echocardiogram    a. Echo 5/11:  EF 55-60%, mild dilated ascending aorta, mod LAE  . Hyperlipidemia   . Hypertension   . Neoplasm of prostate    special screening malignant   . Obesity   . Palpitation     Past Surgical History:  Procedure Laterality Date  . 2-D Echocardiogram  November 2009, 2011  . Anal Fistula Repair    . CARDIAC VALVE SURGERY  1996   status post balloon valvuloplasty at [facility]  . CATARACT EXTRACTION  2004  . COLONOSCOPY  December 2005  . ETT  1991   Negative  . KNEE  ARTHROSCOPY     Right  . TONSILLECTOMY      Current Medications: Current Outpatient Medications on File Prior to Visit  Medication Sig  . acetaminophen (TYLENOL) 500 MG tablet Take 1,000 mg by mouth every 6 (six) hours as needed for pain.   Marland Kitchen warfarin (COUMADIN) 5 MG tablet Take 5mg  6 days a week, skip wednesdays   No current facility-administered medications on file prior to visit.      Allergies:   Codeine   Social History   Socioeconomic History  . Marital status: Married    Spouse name: Not on file  . Number of children: Not on file  . Years of education: Not on file  . Highest education level: Not on file  Occupational History  . Occupation: Retired    Fish farm manager: RETIRED  Social Needs  . Financial resource strain: Not on file  . Food insecurity:    Worry: Not on file    Inability: Not on file  . Transportation needs:    Medical: Not on file    Non-medical: Not on file  Tobacco Use  . Smoking status: Never Smoker  . Smokeless tobacco: Never Used  Substance and Sexual Activity  . Alcohol use: No  . Drug use: Not on file  . Sexual activity: Not on file  Lifestyle  . Physical activity:  Days per week: Not on file    Minutes per session: Not on file  . Stress: Not on file  Relationships  . Social connections:    Talks on phone: Not on file    Gets together: Not on file    Attends religious service: Not on file    Active member of club or organization: Not on file    Attends meetings of clubs or organizations: Not on file    Relationship status: Not on file  Other Topics Concern  . Not on file  Social History Narrative   Regular exercise: Yes: [employer] 4 times/week     Family History: The patient's family history includes Colon cancer in his unknown relative; Coronary artery disease in his unknown relative; Diabetes in his unknown relative; Heart attack in his father; Heart disease in his unknown relative; Prostate cancer in his brother; Stroke in his  brother.  ROS:   Please see the history of present illness.  Additional pertinent ROS: Review of Systems  Constitutional: Negative for chills, diaphoresis, fever and malaise/fatigue.  HENT: Negative for congestion and ear pain.   Eyes: Negative for double vision and pain.  Respiratory: Negative for cough, sputum production and shortness of breath.   Cardiovascular: Positive for palpitations and leg swelling. Negative for chest pain, orthopnea, claudication and PND.  Gastrointestinal: Negative for abdominal pain and blood in stool.  Genitourinary: Negative for hematuria.  Musculoskeletal: Negative for myalgias.  Skin: Negative for rash.  Neurological: Negative for focal weakness, loss of consciousness and headaches.  Endo/Heme/Allergies: Bruises/bleeds easily.   EKGs/Labs/Other Studies Reviewed:    The following studies were reviewed today: Echo from 2016: Left ventricle: The cavity size was normal. There was mild concentric hypertrophy. Systolic function was normal. The estimated ejection fraction was in the range of 55% to 60%. Wall motion was normal; there were no regional wall motion abnormalities. - Aortic valve: There was trivial regurgitation. - Left atrium: The atrium was moderately dilated.  EKG:  EKG is ordered today.  The ekg ordered today demonstrates atrial fibrillation with RVR, heart rate 120.  Recent Labs: 07/31/2017: ALT 22; BUN 20; Creatinine, Ser 1.09; Hemoglobin 17.0; Platelets 210.0; Potassium 4.7; Sodium 140; TSH 2.95  Recent Lipid Panel    Component Value Date/Time   CHOL 210 (H) 06/16/2016 0946   TRIG 215 (H) 06/16/2016 0946   TRIG 150 (H) 09/06/2006 1031   HDL 42 06/16/2016 0946   CHOLHDL 5.0 06/16/2016 0946   VLDL 43 (H) 06/16/2016 0946   LDLCALC 125 06/16/2016 0946   LDLDIRECT 126.0 04/19/2015 1000    Physical Exam:    VS:  BP 112/64   Pulse (!) 121   Ht 6' (1.829 m)   Wt 234 lb 3.2 oz (106.2 kg)   BMI 31.76 kg/m     Wt Readings  from Last 3 Encounters:  04/19/18 234 lb 3.2 oz (106.2 kg)  02/26/18 237 lb (107.5 kg)  01/29/18 235 lb (106.6 kg)     GEN: Well nourished, well developed in no acute distress HEENT: Normal NECK: No JVD; No carotid bruits LYMPHATICS: No lymphadenopathy CARDIAC: regular rhythm, normal S1 and S2, no murmurs, rubs, gallops. Radial and DP pulses 2+ bilaterally. RESPIRATORY:  Clear to auscultation without rales, wheezing or rhonchi  ABDOMEN: Soft, non-tender, non-distended MUSCULOSKELETAL:  No edema; No deformity  SKIN: Warm and dry NEUROLOGIC:  Alert and oriented x 3 PSYCHIATRIC:  Normal affect   ASSESSMENT:    1. Heart palpitations   2.  Chronic atrial fibrillation (HCC)   3. Medication management   4. Atrial fibrillation with rapid ventricular response (HCC)    PLAN:    1. Palpitations: in afib with RVR today. Hemodynamically stable. Does not wish to urgently titrate, feels comfortable with a change in medications from home. -discussed options today. Patient in agreement to stop diltiazem and go back to metoprolol 50 mg twice daily. He will start this today. -instructed that if he feels lightheaded/dizzy, has chest pain, or feels poorly with the rapid heart rate, he should come to be evaluated in urgent care/ER over the weekend. -will check BMET and thyroid to make sure no other aggravating factors  -has not missed his coumadin, INRs have been therapeutic -we will call him next week to check on him. Dr. Claiborne Billings does not have availability in August, so he will check in with me in 4 weeks and then see Dr. Claiborne Billings at his previously scheduled appointment in September.  Plan for follow up: call next week, see me in 4 weeks, then keep appt with Dr. Claiborne Billings in September.  Medication Adjustments/Labs and Tests Ordered: Current medicines are reviewed at length with the patient today.  Concerns regarding medicines are outlined above.  Orders Placed This Encounter  Procedures  . Basic metabolic  panel  . TSH  . EKG 12-Lead   Meds ordered this encounter  Medications  . metoprolol tartrate (LOPRESSOR) 50 MG tablet    Sig: Take 1 tablet (50 mg total) by mouth 2 (two) times daily.    Dispense:  180 tablet    Refill:  3    Dose increase    Patient Instructions  Medication Instructions: Your physician recommends that you Stop: Diltiazem 120 mg daily  Increase: Metoprolol 50 mg two times a day   If you need a refill on your cardiac medications before your next appointment, please call your pharmacy.   Labwork: Your physician recommends that you return for lab work in: Today TSH, BMP   Procedures/Testing: None  Follow-Up: Your physician wants you to follow-up in 1 month with Dr. Harrell Gave.   Special Instructions:    Thank you for choosing [facility] at [facility]!!       Signed, Buford Dresser, MD PhD 04/19/2018 1:55 PM    [place]

## 2018-04-20 LAB — BASIC METABOLIC PANEL
BUN/Creatinine Ratio: 23 (ref 10–24)
BUN: 21 mg/dL (ref 8–27)
CO2: 20 mmol/L (ref 20–29)
Calcium: 9.6 mg/dL (ref 8.6–10.2)
Chloride: 101 mmol/L (ref 96–106)
Creatinine, Ser: 0.92 mg/dL (ref 0.76–1.27)
GFR calc Af Amer: 86 mL/min/{1.73_m2} (ref >59)
GFR calc non Af Amer: 74 mL/min/{1.73_m2} (ref >59)
Glucose: 94 mg/dL (ref 65–99)
Potassium: 4.7 mmol/L (ref 3.5–5.2)
Sodium: 140 mmol/L (ref 134–144)

## 2018-04-20 LAB — TSH: TSH: 4.81 u[IU]/mL — ABNORMAL HIGH (ref 0.450–4.500)

## 2018-04-23 ENCOUNTER — Telehealth: Payer: Self-pay

## 2018-04-23 NOTE — Telephone Encounter (Signed)
Has he been able to check his heart rates? He was running in the 120s in the office. If his average heart rate is 90-100, and he is only briefly having spikes, I think we can stick with the current dose of metoprolol. If his symptoms are improved and it is only brief palpitations around 4 Am, we can stick to the one month follow up. If they are more persistent, we can attempt to titrate the metoprolol further. Thank you!

## 2018-04-23 NOTE — Telephone Encounter (Signed)
Update with lab results. Pt verbalized understanding. Pt states the medication of metoprolol 50 mg BID has helped slow heart rate down, but notice around 4 am every morning he can feel his heart rate increasing. Routing to Dr. Harrell Gave for further recommendations.

## 2018-04-23 NOTE — Telephone Encounter (Signed)
-----   Message from Buford Dresser, MD sent at 04/21/2018 11:08 AM EDT ----- Renal function and electrolytes ok. TSH a little high but borderline--shouldn't be contributing to his rapid afib. Hopefully his heart rate has slowed with the medication switch. Let me know if he has any concerns. Thanks!

## 2018-04-24 ENCOUNTER — Encounter: Payer: Self-pay | Admitting: Cardiovascular Disease

## 2018-04-24 NOTE — Telephone Encounter (Signed)
Pt returned called stating HR at 1045am was 120 BP 118/68 and HR now ( 11:15 am) 109. Pt states symptoms wise, he doesn't feel like his had a spike in HR today but other days it happens appropriately 2-3 times a day but doesn't last long. Routing to MD

## 2018-04-24 NOTE — Telephone Encounter (Signed)
Spoke with pt and advised of Dr. Judeth Cornfield recommendation. Pt states this morning he didn't feel like his heart rate increased but will have his wife check his HR and call back.

## 2018-04-25 NOTE — Telephone Encounter (Signed)
Spoke with pt and advised of Dr. Judeth Cornfield recommendation. Pt verbalized understanding. Advised pt I will call back in the next few days to check on his HR.

## 2018-04-29 NOTE — Telephone Encounter (Signed)
Called pt to see how he did over the weekend. Pt reports HR ranged from 115-118 2 hrs after taking medication. He states he doesn't feel like his heart is racing anymore and over all feels better. Routing to MD.

## 2018-04-29 NOTE — Telephone Encounter (Signed)
I'd like his heart rate to be less than 100 resting, and ideally less than 110 with exercise. Can you ask him if he'd be willing to try 75 mg BID metoprolol? If he has 50 mg tabs, he can do 1.5 in the morning and 1.5 in the evening. If this slows his heart rate, then we can try this dose long term. If he doesn't tolerate the dose, we can go back to 50 mg BID and talk about the next step at his appointment. Thank you!

## 2018-04-29 NOTE — Telephone Encounter (Signed)
Pt updated with Dr. Christopher's recommendation. Pt verbalized understanding.  

## 2018-05-05 ENCOUNTER — Encounter: Payer: Self-pay | Admitting: Cardiovascular Disease

## 2018-05-06 ENCOUNTER — Telehealth: Payer: Self-pay

## 2018-05-06 NOTE — Telephone Encounter (Signed)
Spoke with pt and advised that per Dr. Harrell Gave he is to continue to monitor BP and contact office if BP consistently drops below 90 ( systolic). Pt verbalized understanding.

## 2018-05-06 NOTE — Telephone Encounter (Signed)
See previous phone encounter.

## 2018-05-06 NOTE — Telephone Encounter (Signed)
Since patient had only one low reading and then repeat reading returned to his baseline, will continue to monitor and discuss further at his outpatient follow up visit. Thanks!

## 2018-05-06 NOTE — Telephone Encounter (Signed)
Spoke with pt who states yesterday his blood pressure dropped to 87/66. He reports systolic has been ranging from 113-90's. He reports HR hasn't gotten above 103 since medication change and has been in the 90's. Pt informed to continue to monitor BP but will route to Dr. Harrell Gave for further recommendation.

## 2018-05-07 ENCOUNTER — Encounter: Payer: Self-pay | Admitting: Cardiovascular Disease

## 2018-05-07 NOTE — Telephone Encounter (Signed)
Spoke with pt who denies any feeling light headed/dizziness. Advised pt that per Dr. Harrell Gave, she recommend that he continue to monitor BP and keep scheduled appointment on 8/27. If he start becoming symptomatic, to contact office for a sooner appointment. Pt verbalized understanding.

## 2018-05-15 DIAGNOSIS — L905 Scar conditions and fibrosis of skin: Secondary | ICD-10-CM | POA: Diagnosis not present

## 2018-05-15 DIAGNOSIS — C44319 Basal cell carcinoma of skin of other parts of face: Secondary | ICD-10-CM | POA: Diagnosis not present

## 2018-05-27 NOTE — Progress Notes (Signed)
Cardiology Office Note:    Date:  05/28/2018   ID:  Glen [name], DOB [birth year], MRN [id number]  PCP:  [name], MD  Cardiologist:  Dr. Claiborne Billings  Chief Complaint  Patient presents with  . Follow-up    History of Present Illness:    Glen [name] is a 84 y.o. male with a hx of hypertension, hyperlidemia and permanent AF on coumadin anticoagulation who is seen as a follow up for palpitations. He follows with Dr. Claiborne Billings but was seen by me as an urgent doctor of the day visit previously. This is an interim visit until he can be seen by Dr. Claiborne Billings.  History: He last saw Dr. Claiborne Billings on 12/20/17, at which time he was switched from metoprolol to diltiazem at the patient's request.  In mid June, he started noting palpitations, then on 7/15 his symptoms got worse--heart "shivering". He was taking diltiazem 120 mg CR and metoprolol tartrate 25 mg BID as prescribed. At his urgent visit on 04/19/18, his ECG noted atrial fibrillation with a rapid ventricular rate of 120 bpm. We stopped diltiazem and increased his metoprolol back to his prior dose of 50 mg BID. Over the course of several phone calls, he continued to have elevated heart rates, and his metoprolol was ultimately increased to 75 mg BID.  Today: He feels well overall today. His blood pressure has been well controlled at home, and his heart rates have been much better, in the 80s-90s. He is tolerating 75 mg BID metoprolol well. He has had several episodes of diarrhea, but he notes that he was having issues with this intermittently even before adjusting his medications. No chest pain or shortness of breath.  Past Medical History:  Diagnosis Date  . Atrial fibrillation ([place])   . Diverticulitis   . Drug therapy    Anticoagulation Therapy  . Family history of diabetes insipidus   . FHx: colon cancer   . History of colonic polyps   . Hx of echocardiogram    a. Echo 5/11:  EF 55-60%, mild dilated ascending aorta, mod LAE  . Hyperlipidemia    . Hypertension   . Neoplasm of prostate    special screening malignant   . Obesity   . Palpitation     Past Surgical History:  Procedure Laterality Date  . 2-D Echocardiogram  November 2009, 2011  . Anal Fistula Repair    . CARDIAC VALVE SURGERY  1996   status post balloon valvuloplasty at [facility]  . CATARACT EXTRACTION  2004  . COLONOSCOPY  December 2005  . ETT  1991   Negative  . KNEE ARTHROSCOPY     Right  . TONSILLECTOMY      Current Medications: Current Outpatient Medications on File Prior to Visit  Medication Sig  . acetaminophen (TYLENOL) 500 MG tablet Take 1,000 mg by mouth every 6 (six) hours as needed for pain.   . metoprolol tartrate (LOPRESSOR) 50 MG tablet Take 1 tablet (50 mg total) by mouth 2 (two) times daily.  Marland Kitchen warfarin (COUMADIN) 5 MG tablet Take 5mg  6 days a week, skip wednesdays   No current facility-administered medications on file prior to visit.    actually taking 75 mg BID metoprolol  Allergies:   Codeine   Social History   Socioeconomic History  . Marital status: Married    Spouse name: Not on file  . Number of children: Not on file  . Years of education: Not on file  . Highest education level:  Not on file  Occupational History  . Occupation: Retired    Fish farm manager: RETIRED  Social Needs  . Financial resource strain: Not on file  . Food insecurity:    Worry: Not on file    Inability: Not on file  . Transportation needs:    Medical: Not on file    Non-medical: Not on file  Tobacco Use  . Smoking status: Never Smoker  . Smokeless tobacco: Never Used  Substance and Sexual Activity  . Alcohol use: No  . Drug use: Not on file  . Sexual activity: Not on file  Lifestyle  . Physical activity:    Days per week: Not on file    Minutes per session: Not on file  . Stress: Not on file  Relationships  . Social connections:    Talks on phone: Not on file    Gets together: Not on file    Attends religious service: Not on file    Active  member of club or organization: Not on file    Attends meetings of clubs or organizations: Not on file    Relationship status: Not on file  Other Topics Concern  . Not on file  Social History Narrative   Regular exercise: Yes: [employer] 4 times/week     Family History: The patient's family history includes Colon cancer in his unknown relative; Coronary artery disease in his unknown relative; Diabetes in his unknown relative; Heart attack in his father; Heart disease in his unknown relative; Prostate cancer in his brother; Stroke in his brother.  ROS:   Please see the history of present illness.  Additional pertinent ROS: Review of Systems  Constitutional: Negative for chills, diaphoresis, fever and malaise/fatigue.  HENT: Negative for congestion and ear pain.   Eyes: Negative for double vision and pain.  Respiratory: Negative for cough, sputum production and shortness of breath.   Cardiovascular: Negative for chest pain, palpitations, orthopnea, claudication, leg swelling and PND.  Gastrointestinal: Negative for abdominal pain and blood in stool.  Genitourinary: Negative for hematuria.  Musculoskeletal: Negative for myalgias.  Skin: Negative for rash.  Neurological: Negative for focal weakness, loss of consciousness and headaches.  Endo/Heme/Allergies: Bruises/bleeds easily.   EKGs/Labs/Other Studies Reviewed:    The following studies were reviewed today: Echo from 2016: Left ventricle: The cavity size was normal. There was mild concentric hypertrophy. Systolic function was normal. The estimated ejection fraction was in the range of 55% to 60%. Wall motion was normal; there were no regional wall motion abnormalities. - Aortic valve: There was trivial regurgitation. - Left atrium: The atrium was moderately dilated.  EKG:  Not ordered today  Recent Labs: 07/31/2017: ALT 22; Hemoglobin 17.0; Platelets 210.0 04/19/2018: BUN 21; Creatinine, Ser 0.92; Potassium 4.7; Sodium 140;  TSH 4.810  Recent Lipid Panel    Component Value Date/Time   CHOL 210 (H) 06/16/2016 0946   TRIG 215 (H) 06/16/2016 0946   TRIG 150 (H) 09/06/2006 1031   HDL 42 06/16/2016 0946   CHOLHDL 5.0 06/16/2016 0946   VLDL 43 (H) 06/16/2016 0946   LDLCALC 125 06/16/2016 0946   LDLDIRECT 126.0 04/19/2015 1000    Physical Exam:    VS:  BP 108/69   Pulse 88   Ht 6' (1.829 m)   Wt 230 lb 6.4 oz (104.5 kg)   BMI 31.25 kg/m     Wt Readings from Last 3 Encounters:  05/28/18 230 lb 6.4 oz (104.5 kg)  04/19/18 234 lb 3.2 oz (106.2 kg)  02/26/18 237 lb (107.5 kg)     GEN: Well nourished, well developed in no acute distress HEENT: Normal NECK: No JVD; No carotid bruits LYMPHATICS: No lymphadenopathy CARDIAC: irregularly irregular, normal S1 and S2, no murmurs, rubs, gallops. Radial and DP pulses 2+ bilaterally. RESPIRATORY:  Clear to auscultation without rales, wheezing or rhonchi  ABDOMEN: Soft, non-tender, non-distended MUSCULOSKELETAL:  No edema; No deformity  SKIN: Warm and dry NEUROLOGIC:  Alert and oriented x 3 PSYCHIATRIC:  Normal affect   ASSESSMENT:    1. Permanent atrial fibrillation ([place])   2. Medication management    PLAN:    1. Atrial fibrillation: now rate controlled, symptoms improved. -continue metoprolol 75 mg BID, sent to [facility]. After sent, noted that 75 mg tabs are very expensive, ok with 3-25 mg tabs BID. -has not missed his coumadin, INRs have been therapeutic -he will see Dr. Claiborne Billings at his previously scheduled appointment in September.  Medication Adjustments/Labs and Tests Ordered: Current medicines are reviewed at length with the patient today.  Concerns regarding medicines are outlined above.  No orders of the defined types were placed in this encounter.  Meds ordered this encounter  Medications  . metoprolol tartrate 75 MG TABS    Sig: Take 75 mg by mouth 2 (two) times daily.    Dispense:  180 tablet    Refill:  3    Dose increase     Patient Instructions  Medication Instructions: Your physician recommends that you continue on your current medications as directed.    If you need a refill on your cardiac medications before your next appointment, please call your pharmacy.   Labwork: None  Procedures/Testing: None  Follow-Up: Your physician wants you to keep follow up appointment with Dr. Claiborne Billings.   Special Instructions:    Thank you for choosing Heartcare at [facility]!!       Signed, Buford Dresser, MD PhD 05/28/2018 9:57 AM    [place]

## 2018-05-28 ENCOUNTER — Ambulatory Visit (INDEPENDENT_AMBULATORY_CARE_PROVIDER_SITE_OTHER): Payer: Medicare Other | Admitting: Cardiology

## 2018-05-28 ENCOUNTER — Encounter: Payer: Self-pay | Admitting: Cardiology

## 2018-05-28 VITALS — BP 108/69 | HR 88 | Ht 72.0 in | Wt 230.4 lb

## 2018-05-28 DIAGNOSIS — I4821 Permanent atrial fibrillation: Secondary | ICD-10-CM

## 2018-05-28 DIAGNOSIS — I482 Chronic atrial fibrillation: Secondary | ICD-10-CM

## 2018-05-28 DIAGNOSIS — Z79899 Other long term (current) drug therapy: Secondary | ICD-10-CM

## 2018-05-28 MED ORDER — METOPROLOL TARTRATE 75 MG PO TABS: 75.0000 mg | ORAL_TABLET | Freq: Two times a day (BID) | ORAL | 3 refills | Status: DC

## 2018-05-28 NOTE — Patient Instructions (Signed)
Medication Instructions: Your physician recommends that you continue on your current medications as directed.    If you need a refill on your cardiac medications before your next appointment, please call your pharmacy.   Labwork: None  Procedures/Testing: None  Follow-Up: Your physician wants you to keep follow up appointment with Dr. Claiborne Billings.   Special Instructions:    Thank you for choosing [facility]care at [facility]!!

## 2018-05-29 ENCOUNTER — Ambulatory Visit (INDEPENDENT_AMBULATORY_CARE_PROVIDER_SITE_OTHER): Payer: Medicare Other | Admitting: General Practice

## 2018-05-29 DIAGNOSIS — Z7901 Long term (current) use of anticoagulants: Secondary | ICD-10-CM

## 2018-05-29 LAB — POCT INR: INR: 2.5 (ref 2.0–3.0)

## 2018-05-29 NOTE — Patient Instructions (Addendum)
Pre visit review using our clinic review tool, if applicable. No additional management support is needed unless otherwise documented below in the visit note.  Continue to take 1 tablet daily except nothing on Wednesdays.  Re-check in 6 weeks.  

## 2018-05-31 DIAGNOSIS — I4821 Permanent atrial fibrillation: Secondary | ICD-10-CM

## 2018-05-31 MED ORDER — METOPROLOL TARTRATE 25 MG PO TABS: 75.0000 mg | ORAL_TABLET | Freq: Two times a day (BID) | ORAL | 3 refills | Status: DC

## 2018-06-05 DIAGNOSIS — B351 Tinea unguium: Secondary | ICD-10-CM | POA: Diagnosis not present

## 2018-06-05 DIAGNOSIS — B353 Tinea pedis: Secondary | ICD-10-CM | POA: Diagnosis not present

## 2018-06-05 DIAGNOSIS — L738 Other specified follicular disorders: Secondary | ICD-10-CM | POA: Diagnosis not present

## 2018-06-05 DIAGNOSIS — L700 Acne vulgaris: Secondary | ICD-10-CM | POA: Diagnosis not present

## 2018-06-25 ENCOUNTER — Ambulatory Visit (INDEPENDENT_AMBULATORY_CARE_PROVIDER_SITE_OTHER): Payer: Medicare Other | Admitting: Cardiovascular Disease

## 2018-06-25 ENCOUNTER — Encounter: Payer: Self-pay | Admitting: Cardiovascular Disease

## 2018-06-25 VITALS — BP 112/62 | HR 123 | Ht 72.0 in | Wt 230.4 lb

## 2018-06-25 DIAGNOSIS — E669 Obesity, unspecified: Secondary | ICD-10-CM

## 2018-06-25 DIAGNOSIS — E782 Mixed hyperlipidemia: Secondary | ICD-10-CM | POA: Diagnosis not present

## 2018-06-25 DIAGNOSIS — Z7901 Long term (current) use of anticoagulants: Secondary | ICD-10-CM | POA: Diagnosis not present

## 2018-06-25 DIAGNOSIS — I10 Essential (primary) hypertension: Secondary | ICD-10-CM | POA: Diagnosis not present

## 2018-06-25 DIAGNOSIS — I482 Chronic atrial fibrillation: Secondary | ICD-10-CM | POA: Diagnosis not present

## 2018-06-25 DIAGNOSIS — I4821 Permanent atrial fibrillation: Secondary | ICD-10-CM

## 2018-06-25 MED ORDER — METOPROLOL TARTRATE 100 MG PO TABS: 100.0000 mg | ORAL_TABLET | Freq: Two times a day (BID) | ORAL | 3 refills | Status: DC

## 2018-06-25 NOTE — Patient Instructions (Signed)
Medication Instructions:  INCREASE metoprolol tartrate (Lopressor) to 100 mg two times daily  Follow-Up: 12/3 at 11:40 AM with Dr. Claiborne Billings  Any Other Special Instructions Will Be Listed Below (If Applicable).     If you need a refill on your cardiac medications before your next appointment, please call your pharmacy.

## 2018-06-25 NOTE — Progress Notes (Signed)
Primary MD: Dr. Bluford Kaufmann  PATIENT PROFILE: Glen [name] is a 84 y.o. male who is a former patient of Dr. Mare Ferrari who established care with me in September 2017.  He presents for six-month follow-up cardiology evaluation.  HPI:  Glen [name] has a history of permanent AF, hypertension and hyperlipidemia. His AF has always been treated with rate control and he neveer had an attempt of cardioversion. He is on coumadin anticoagulation with a cha2ds2vasc score of 3. In the past he was treated with diltiazem and atenolol. He had gingival disease and was taken off diltiazem. Most recently he has been on metoprolol 50 mg bid for rate control but had reduced this to 25 mg bid since he felt that it had contributed to edema  He denies any awareness of sleep apnea. He does not exercise due to knee issues and gets injections into his kness intermittently. He last saw Dr. Mare Ferrari in 11/2015 and complained that his feet :were puffy on the bottom."   When I saw him, I did not feel that the metoprolol was contributing to his feet discomfort.  He had been switched to act to atenolol and most recently is now on metoprolol 50 mg in the morning and 25 mg, which she has been taking the early afternoon per Dr. Burnice Logan.  He continues to be on warfarin anticoagulation.  He continues to experience "puffiness "of his ankles.  At times he admits to eating food with increase sodium.  He denied chest pain, PND, orthopnea.  He denied bleeding.   When I last saw him he was having periods of diarrhea and constipation.  In the past, he was taken off Cardizem due to gingival hypertrophy by his dentist. He wanted to retry Cardizem for improved rate control along with metoprolol but ultimately stopped taking this.  Since I saw him he has been seen on 2 occasions by Dr. Harrell Gave with increased heart rate.  His beta-blocker regimen has been titrated to 50 mg twice a day and most recently to 75 mg twice a day.  He  has continued to be on warfarin for anticoagulation.  He is unaware if his heart rhythm is fast.  Recent TSH was 4.8.  Now presents for follow-up evaluation.  Past Medical History:  Diagnosis Date  . Atrial fibrillation ([place])   . Diverticulitis   . Drug therapy    Anticoagulation Therapy  . Family history of diabetes insipidus   . FHx: colon cancer   . History of colonic polyps   . Hx of echocardiogram    a. Echo 5/11:  EF 55-60%, mild dilated ascending aorta, mod LAE  . Hyperlipidemia   . Hypertension   . Neoplasm of prostate    special screening malignant   . Obesity   . Palpitation     Past Surgical History:  Procedure Laterality Date  . 2-D Echocardiogram  November 2009, 2011  . Anal Fistula Repair    . CARDIAC VALVE SURGERY  1996   status post balloon valvuloplasty at [facility]  . CATARACT EXTRACTION  2004  . COLONOSCOPY  December 2005  . ETT  1991   Negative  . KNEE ARTHROSCOPY     Right  . TONSILLECTOMY      Allergies  Allergen Reactions  . Codeine Other (See Comments)    constpation    Current Outpatient Medications  Medication Sig Dispense Refill  . acetaminophen (TYLENOL) 500 MG tablet Take 1,000 mg by mouth every 6 (  six) hours as needed for pain.     . metoprolol tartrate (LOPRESSOR) 100 MG tablet Take 1 tablet (100 mg total) by mouth 2 (two) times daily. 180 tablet 3  . warfarin (COUMADIN) 5 MG tablet Take 31m 6 days a week, skip wednesdays 90 tablet 1   No current facility-administered medications for this visit.     Social History   Socioeconomic History  . Marital status: Married    Spouse name: Not on file  . Number of children: Not on file  . Years of education: Not on file  . Highest education level: Not on file  Occupational History  . 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 Financial resource strain: Not on file  . Food insecurity:    Worry: Not on file    Inability: Not on file  . Transportation needs:    Medical:  Not on file    Non-medical: Not on file  Tobacco Use  . Smoking status: Never Smoker  . Smokeless tobacco: Never Used  Substance and Sexual Activity  . Alcohol use: No  . Drug use: Not on file  . Sexual activity: Not on file  Lifestyle  . Physical activity:    Days per week: Not on file    Minutes per session: Not on file  . Stress: Not on file  Relationships  . Social connections:    Talks on phone: Not on file    Gets together: Not on file    Attends religious service: Not on file    Active member of club or organization: Not on file    Attends meetings of clubs or organizations: Not on file    Relationship status: Not on file  . Intimate partner violence:    Fear of current or ex partner: Not on file    Emotionally abused: Not on file    Physically abused: Not on file    Forced sexual activity: Not on file  Other Topics Concern  . Not on file  Social History Narrative   Regular exercise: Yes: [employer] 4 times/week    Family History  Problem Relation Age of Onset  . Heart attack Father   . Colon cancer Unknown   . Diabetes Unknown   . Prostate cancer Brother   . Heart disease Unknown   . Coronary artery disease Unknown        Two siblings, status post stenting to siblings with heart disease. One. Status post pacemaker insertion.  . Stroke Brother     ROS General: Negative; No fevers, chills, or night sweats HEENT: Negative; No changes in vision or hearing, sinus congestion, difficulty swallowing Pulmonary: Negative; No cough, wheezing, shortness of breath, hemoptysis Cardiovascular:  See HPI; No chest pain, presyncope, syncope, palpitations, edema GI:.  Positive for increased gas, diarrhea as well as constipation GU: Negative; No dysuria, hematuria, or difficulty voiding Musculoskeletal: Negative; no myalgias, joint pain, or weakness Hematologic/Oncologic: Negative; no easy bruising, bleeding Endocrine: Negative; no heat/cold intolerance; no diabetes Neuro:  Negative; no changes in balance, headaches Skin: Negative; No rashes or skin lesions Psychiatric: Negative; No behavioral problems, depression Sleep: Negative; No daytime sleepiness, hypersomnolence, bruxism, restless legs, hypnogagnic hallucinations Other comprehensive 14 point system review is negative   Physical Exam BP 112/62   Pulse (!) 123   Ht 6' (1.829 m)   Wt 230 lb 6.4 oz (104.5 kg)   BMI 31.25 kg/m    Repeat blood pressure by me was 118/68.  Wt Readings from Last 3 Encounters:  06/25/18 230 lb 6.4 oz (104.5 kg)  05/28/18 230 lb 6.4 oz (104.5 kg)  04/19/18 234 lb 3.2 oz (106.2 kg)   General: Alert, oriented, no distress.  Skin: normal turgor, no rashes, warm and dry HEENT: Normocephalic, atraumatic. Pupils equal round and reactive to light; sclera anicteric; extraocular muscles intact;  Nose without nasal septal hypertrophy Mouth/Parynx benign; Mallinpatti scale 3 Neck: No JVD, no carotid bruits; normal carotid upstroke Lungs: clear to ausculatation and percussion; no wheezing or rales Chest wall: without tenderness to palpitation Heart: PMI not displaced, really irregular with an increased rate at approximately 120 bpm, s1 s2 normal, 1/6 systolic murmur, no diastolic murmur, no rubs, gallops, thrills, or heaves Abdomen: soft, nontender; no hepatosplenomehaly, BS+; abdominal aorta nontender and not dilated by palpation. Back: no CVA tenderness Pulses 2+ Musculoskeletal: full range of motion, normal strength, no joint deformities Extremities: no clubbing cyanosis or edema, Homan's sign negative  Neurologic: grossly nonfocal; Cranial nerves grossly wnl Psychologic: Normal mood and affect   ECG (independently read by me): Atrial fibrillation with ventricular rate of 123 bpm.  Nonspecific T wave abnormality.  December 20, 2017 ECG (independently read by me): Atrial fibrillation with ventricular rate at 97 bpm.  Small Q wave in 3.  Low voltage.  September 2018ECG  (independently read by me): Atrial fibrillation with ventricular rate at approximately 100 bpm.  Q wave in aVF; low voltage.  Early transition.  No significant ST changes  March 2018 ECG (independently read by me): Atrial fibrillation with ventricular rate at 104 bpm.  QTc interval 433 ms.  September 2017 ECG (independently read by me): AF at 106 without significant STT changes.  LABS:  BMP Latest Ref Rng & Units 04/19/2018 07/31/2017 07/14/2016  Glucose 65 - 99 mg/dL 94 103(H) 101(H)  BUN 8 - 27 mg/dL _0 Creatinine 0.76 - 1.27 mg/dL 0.92 1.09 1.25(H)  BUN/Creat Ratio 10 - 24 23 - -  Sodium 134 - 144 mmol/L 140 140 138  Potassium 3.5 - 5.2 mmol/L 4.7 4.7 4.8  Chloride 96 - 106 mmol/L 101 104 104  CO2 20 - 29 mmol/L _1 Calcium 8.6 - 10.2 mg/dL 9.6 9.6 9.1     Hepatic Function Latest Ref Rng & Units 07/31/2017 07/14/2016 06/16/2016  Total Protein 6.0 - 8.3 g/dL 6.5 6.5 6.5  Albumin 3.5 - 5.2 g/dL 4.1 4.0 4.1  AST 0 - 37 U/L _2 ALT 0 - 53 U/L _3 Alk Phosphatase 39 - 117 U/L 83 67 69  Total Bilirubin 0.2 - 1.2 mg/dL 0.6 0.8 0.8  Bilirubin, Direct 0.0 - 0.3 mg/dL - - -    CBC Latest Ref Rng & Units 07/31/2017 06/16/2016 11/18/2015  WBC 4.0 - 10.5 K/uL 7.4 8.8 8.8  Hemoglobin 13.0 - 17.0 g/dL 17.0 17.5(H) 17.0  Hematocrit 39.0 - 52.0 % 50.6 51.8(H) 49.9  Platelets 150.0 - 400.0 K/uL 210.0 203 206   Lab Results  Component Value Date   MCV 96.8 07/31/2017   MCV 95.2 06/16/2016   MCV 94.5 11/18/2015   Lab Results  Component Value Date   TSH 4.810 (H) 04/19/2018   Lab Results  Component Value Date   HGBA1C 5.9 07/31/2017    MG 2.1  BNP    Component Value Date/Time   BNP 27.4 11/18/2015 1024    ProBNP No results found for: PROBNP   Lipid Panel  Component Value Date/Time   CHOL 210 (H) 06/16/2016 0946   TRIG 215 (H) 06/16/2016 0946   TRIG 150 (H) 09/06/2006 1031   HDL 42 06/16/2016 0946   CHOLHDL 5.0 06/16/2016 0946   VLDL 43 (H)  06/16/2016 0946   LDLCALC 125 06/16/2016 0946   LDLDIRECT 126.0 04/19/2015 1000    RADIOLOGY: No results found.   IMPRESSION:  1. Permanent atrial fibrillation ([place])   2. Anticoagulation adequate   3. Essential hypertension   4. Mixed hyperlipidemia   5. Mild obesity   6. Essential hypertension, benign     ASSESSMENT AND PLAN: Mr Suder is an 84 year old [place] male who has a history of hypertension, hyperlidemia and permanent AF on coumadin anticoagulation.  His echo in 12/2014 was unchanged from a prior echo of 2011, and ejection fraction remained normal at 55-60%.  His left atrium was moderately dilated which was probably contributed by his atrial fibrillation. He continues to be on warfarin for anticoagulation with INH goal of 2.5. When I saw the patient in March 2018 , I had recommended titration of metoprolol for improved rate control and subsequently increase this to 50 mg twice a day.  He developed some dependent rubor with mild edema  and I suspect this may be contributed by mild venous insufficiency.  He was given a prescription for HCTZ, which he did not take and therefore I recommended support stockings.  When I last saw him, he want to be taken off metoprolol since he felt this was contributing to diarrhea and in the past he was there was concern that diltiazem may have caused gingival hypertrophy.  Tried reinstituting low-dose Cardizem at bedtime and as result reduced his metoprolol.  Subsequently the Cardizem was discontinued and over his last past 2 office visits with Dr. Harrell Gave his metoprolol dose has been titrated to 75 mg twice a day.  Despite this, heart rate today continues to be increased at 120 bpm.  I have further titrated metoprolol to 100 mg twice a day.  His most recent INR last week was therapeutic at 2.5.  There is trace ankle edema right greater than left not significant.  We again discussed support stockings.  I reviewed recent laboratory.  He has not had  recent lipid studies fasting lipid panel was recommended.  He is mildly obese.  Weight loss was recommended.  I will see him in 2 months for reevaluation.  Time Spent: 25 minutes  Troy Sine, MD, [facility] 06/26/2018 7:00 PM

## 2018-06-26 ENCOUNTER — Encounter: Payer: Self-pay | Admitting: Cardiovascular Disease

## 2018-07-03 ENCOUNTER — Ambulatory Visit (INDEPENDENT_AMBULATORY_CARE_PROVIDER_SITE_OTHER): Payer: Medicare Other | Admitting: General Practice

## 2018-07-03 ENCOUNTER — Ambulatory Visit: Payer: Medicare Other

## 2018-07-03 DIAGNOSIS — Z7901 Long term (current) use of anticoagulants: Secondary | ICD-10-CM

## 2018-07-03 DIAGNOSIS — Z23 Encounter for immunization: Secondary | ICD-10-CM

## 2018-07-03 LAB — POCT INR: INR: 2.4 (ref 2.0–3.0)

## 2018-07-03 NOTE — Patient Instructions (Addendum)
Pre visit review using our clinic review tool, if applicable. No additional management support is needed unless otherwise documented below in the visit note.  Continue to take 1 tablet daily except nothing on Wednesdays.  Re-check in 6 weeks.  

## 2018-07-04 NOTE — Progress Notes (Signed)
I have reviewed the patient's encounter and agree with the documentation.  [name]. [name], MD 07/04/2018 1:55 PM

## 2018-07-29 ENCOUNTER — Encounter: Payer: Self-pay | Admitting: Family Medicine

## 2018-07-29 ENCOUNTER — Ambulatory Visit (INDEPENDENT_AMBULATORY_CARE_PROVIDER_SITE_OTHER): Payer: Medicare Other | Admitting: Family Medicine

## 2018-07-29 VITALS — BP 116/64 | HR 92 | Temp 98.1°F | Ht 72.0 in | Wt 228.8 lb

## 2018-07-29 DIAGNOSIS — R609 Edema, unspecified: Secondary | ICD-10-CM | POA: Diagnosis not present

## 2018-07-29 DIAGNOSIS — R1310 Dysphagia, unspecified: Secondary | ICD-10-CM | POA: Diagnosis not present

## 2018-07-29 DIAGNOSIS — I10 Essential (primary) hypertension: Secondary | ICD-10-CM | POA: Diagnosis not present

## 2018-07-29 DIAGNOSIS — R1319 Other dysphagia: Secondary | ICD-10-CM | POA: Insufficient documentation

## 2018-07-29 DIAGNOSIS — B351 Tinea unguium: Secondary | ICD-10-CM | POA: Insufficient documentation

## 2018-07-29 HISTORY — DX: Other dysphagia: R13.19

## 2018-07-29 NOTE — Progress Notes (Signed)
   Subjective:  Glen Glen [name] is Glen [name] 84 y.o. male who presents today with Glen [name] chief complaint of leg edema.   HPI:  Leg Edema, chronic problem Last seen about 5 months ago for this. Symptoms have been stable since then. Has bilateral leg edema.   Dysphagia Started about Glen [name] year ago. Stable over that time. When he eats certain foods like sandwiches, he feels like it gets it stuck in his throat. He has to cough when this happens which causes him to occasionally regurgitate food. He tries drinking water which helps him pass food.  He does not have any issues with liquids.  Does not have any issue with soft foods such as grits or oatmeal.  Onychomycosis, chronic problem Several year history. Has seen podiatry in the past.  Would like another referral today.  Hypertension On metoprolol tartrate 100 mg twice daily and tolerating well.  ROS: Per HPI  PMH: He reports that he has never smoked. He has never used smokeless tobacco. He reports that he does not drink alcohol. His drug history is not on file.  Objective:  Physical Exam: BP 116/64 (BP Location: Left Arm, Patient Position: Sitting, Cuff Size: Normal)   Pulse 92   Temp 98.1 F (36.7 C) (Oral)   Ht 6' (1.829 m)   Wt 228 lb 12.8 oz (103.8 kg)   SpO2 95%   BMI 31.03 kg/m   Wt Readings from Last 3 Encounters:  07/29/18 228 lb 12.8 oz (103.8 kg)  06/25/18 230 lb 6.4 oz (104.5 kg)  05/28/18 230 lb 6.4 oz (104.5 kg)  Gen: NAD, resting comfortably CV: RRR with no murmurs appreciated Pulm: NWOB, CTAB with no crackles, wheezes, or rhonchi MSK: 1+ pitting edema in bilateral lower extremities.  Onychomycosis bilateral feet.  Assessment/Plan:  Peripheral edema Likely venous insufficiency.  Patient does not want to start medications today.  Discussed conservative management including avoidance of sodium, frequent exercise, leg elevation, and compression.  Onychomycosis Placed referral to podiatry.  Essential hypertension At goal.   Continue metoprolol 100 mg twice daily.  Dysphagia Patient has had at least Glen [name] 10 pound weight loss over the last few months.  I recommended referral to GI for further evaluation and to rule out possible malignancy, however patient declined.  We will proceed with watchful waiting.  [name]. [name], MD 07/29/2018 12:32 PM

## 2018-07-29 NOTE — Assessment & Plan Note (Signed)
Likely venous insufficiency.  Patient does not want to start medications today.  Discussed conservative management including avoidance of sodium, frequent exercise, leg elevation, and compression.

## 2018-07-29 NOTE — Assessment & Plan Note (Signed)
At goal.  Continue metoprolol 100 mg twice daily.

## 2018-07-29 NOTE — Assessment & Plan Note (Signed)
Patient has had at least a 10 pound weight loss over the last few months.  I recommended referral to GI for further evaluation and to rule out possible malignancy, however patient declined.  We will proceed with watchful waiting.

## 2018-07-29 NOTE — Assessment & Plan Note (Signed)
Placed referral to podiatry

## 2018-07-29 NOTE — Patient Instructions (Signed)
It was very nice to see you today!  No medication changes today.  I will send in a referral to the podiatrist for your feet.  Please avoid salt intake and keep your legs elevated.   Please let me know if you change your mind about the GI referral.   Come back to see me in 6 months, or sooner as needed.   Take care, Dr [name]

## 2018-08-14 ENCOUNTER — Ambulatory Visit (INDEPENDENT_AMBULATORY_CARE_PROVIDER_SITE_OTHER): Payer: Medicare Other | Admitting: General Practice

## 2018-08-14 DIAGNOSIS — I4891 Unspecified atrial fibrillation: Secondary | ICD-10-CM | POA: Diagnosis not present

## 2018-08-14 DIAGNOSIS — Z7901 Long term (current) use of anticoagulants: Secondary | ICD-10-CM

## 2018-08-14 LAB — POCT INR: INR: 2.8 (ref 2.0–3.0)

## 2018-08-14 NOTE — Progress Notes (Signed)
I have reviewed the patient's encounter and agree with the documentation.  Glen [name]. [name], MD 08/14/2018 8:52 AM

## 2018-08-14 NOTE — Patient Instructions (Addendum)
Pre visit review using our clinic review tool, if applicable. No additional management support is needed unless otherwise documented below in the visit note.  Continue to take 1 tablet daily except nothing on Wednesdays.  Re-check in 6 weeks.  

## 2018-09-03 ENCOUNTER — Ambulatory Visit (INDEPENDENT_AMBULATORY_CARE_PROVIDER_SITE_OTHER): Payer: Medicare Other | Admitting: Cardiovascular Disease

## 2018-09-03 ENCOUNTER — Encounter: Payer: Self-pay | Admitting: Cardiovascular Disease

## 2018-09-03 VITALS — BP 134/80 | HR 88 | Ht 72.0 in | Wt 228.8 lb

## 2018-09-03 DIAGNOSIS — E782 Mixed hyperlipidemia: Secondary | ICD-10-CM | POA: Diagnosis not present

## 2018-09-03 DIAGNOSIS — I10 Essential (primary) hypertension: Secondary | ICD-10-CM

## 2018-09-03 DIAGNOSIS — Z7901 Long term (current) use of anticoagulants: Secondary | ICD-10-CM

## 2018-09-03 DIAGNOSIS — I4821 Permanent atrial fibrillation: Secondary | ICD-10-CM

## 2018-09-03 DIAGNOSIS — R609 Edema, unspecified: Secondary | ICD-10-CM

## 2018-09-03 MED ORDER — METOPROLOL TARTRATE 100 MG PO TABS: 100.0000 mg | ORAL_TABLET | Freq: Two times a day (BID) | ORAL | 3 refills | Status: DC

## 2018-09-03 NOTE — Patient Instructions (Signed)
Medication Instructions:  Your physician recommends that you continue on your current medications as directed. Please refer to the Current Medication list given to you today.  If you need a refill on your cardiac medications before your next appointment, please call your pharmacy.   Lab work: Please return for FASTING labs  (CMET, CBC, Lipid, TSH, Mag)  Our in office lab hours are Monday-Friday 8:00-4:00, closed for lunch 12:45-1:45 pm.  No appointment needed.  If you have labs (blood work) drawn today and your tests are completely normal, you will receive your results only by: Marland Kitchen MyChart Message (if you have MyChart) OR . A paper copy in the mail If you have any lab test that is abnormal or we need to change your treatment, we will call you to review the results.  Follow-Up: At [facility], you and your health needs are our priority.  As part of our continuing mission to provide you with exceptional heart care, we have created designated Provider Care Teams.  These Care Teams include your primary Cardiologist (physician) and Advanced Practice Providers (APPs -  Physician Assistants and Nurse Practitioners) who all work together to provide you with the care you need, when you need it. You will need a follow up appointment in 6 months.  Please call our office 2 months in advance to schedule this appointment.  You may see Dr. Claiborne Billings or one of the following Advanced Practice Providers on your designated Care Team: [place], [place] . Fabian Sharp, PA-C

## 2018-09-03 NOTE — Progress Notes (Signed)
Primary MD: Dr. Bluford Kaufmann  PATIENT PROFILE: Glen [name] is a 84 y.o. male who is a former patient of Dr. Mare Ferrari who established care with me in September 2017.  He presents for 2 month follow-up cardiology evaluation.  HPI:  Glen [name] has a history of permanent AF, hypertension and hyperlipidemia. His AF has always been treated with rate control and he neveer had an attempt of cardioversion. He is on coumadin anticoagulation with a cha2ds2vasc score of 3. In the past he was treated with diltiazem and atenolol. He had gingival disease and was taken off diltiazem. Most recently he has been on metoprolol 50 mg bid for rate control but had reduced this to 25 mg bid since he felt that it had contributed to edema  He denies any awareness of sleep apnea. He does not exercise due to knee issues and gets injections into his kness intermittently. He last saw Dr. Mare Ferrari in 11/2015 and complained that his feet :were puffy on the bottom."   When I saw him, I did not feel that the metoprolol was contributing to his feet discomfort.  He had been switched to act to atenolol and most recently is now on metoprolol 50 mg in the morning and 25 mg, which she has been taking the early afternoon per Dr. Burnice Logan.  He continues to be on warfarin anticoagulation.  He continues to experience "puffiness "of his ankles.  At times he admits to eating food with increase sodium.  He denied chest pain, PND, orthopnea.  He denied bleeding.   He was having periods of diarrhea and constipation.  In the past, he was taken off Cardizem due to gingival hypertrophy by his dentist. He wanted to retry Cardizem for improved rate control along with metoprolol but ultimately stopped taking this.  Since I saw him he has been seen on 2 occasions by Dr. Harrell Gave with increased heart rate.  His beta-blocker regimen has been titrated to 50 mg twice a day and recently to 75 mg twice a day.  He has continued to be on  warfarin for anticoagulation.  He is unaware if his heart rhythm is fast. TSH was 4.8.    When I last saw him in September 2019 his ventricular rate was increased and in the 120s.  At that time I further tried to titrated metoprolol to 100 mg twice a day.  On his increased regimen he feels his heart rate has improved.  He denies any recent episodes of chest pain or shortness of breath.  At times there is some mild leg swelling.  He continues to be on warfarin for anticoagulation.  He presents for reevaluation.  Past Medical History:  Diagnosis Date  . Atrial fibrillation ([place])   . Diverticulitis   . Drug therapy    Anticoagulation Therapy  . Family history of diabetes insipidus   . FHx: colon cancer   . History of colonic polyps   . Hx of echocardiogram    a. Echo 5/11:  EF 55-60%, mild dilated ascending aorta, mod LAE  . Hyperlipidemia   . Hypertension   . Neoplasm of prostate    special screening malignant   . Obesity   . Palpitation     Past Surgical History:  Procedure Laterality Date  . 2-D Echocardiogram  November 2009, 2011  . Anal Fistula Repair    . CARDIAC VALVE SURGERY  1996   status post balloon valvul[facility]  . CATARACT EXTRACTION  2004  . COLONOSCOPY  December 2005  . ETT  1991   Negative  . KNEE ARTHROSCOPY     Right  . TONSILLECTOMY      Allergies  Allergen Reactions  . Codeine Other (See Comments)    constpation    Current Outpatient Medications  Medication Sig Dispense Refill  . acetaminophen (TYLENOL) 500 MG tablet Take 1,000 mg by mouth every 6 (six) hours as needed for pain.     . metoprolol tartrate (LOPRESSOR) 100 MG tablet Take 1 tablet (100 mg total) by mouth 2 (two) times daily. 180 tablet 3  . warfarin (COUMADIN) 5 MG tablet Take 42m 6 days a week, skip wednesdays 90 tablet 1   No current facility-administered medications for this visit.     Social History   Socioeconomic History  . Marital status: Married    Spouse name: Not  on file  . Number of children: Not on file  . Years of education: Not on file  . Highest education level: Not on file  Occupational History  . 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 Financial resource strain: Not on file  . Food insecurity:    Worry: Not on file    Inability: Not on file  . Transportation needs:    Medical: Not on file    Non-medical: Not on file  Tobacco Use  . Smoking status: Never Smoker  . Smokeless tobacco: Never Used  Substance and Sexual Activity  . Alcohol use: No  . Drug use: Not on file  . Sexual activity: Not on file  Lifestyle  . Physical activity:    Days per week: Not on file    Minutes per session: Not on file  . Stress: Not on file  Relationships  . Social connections:    Talks on phone: Not on file    Gets together: Not on file    Attends religious service: Not on file    Active member of club or organization: Not on file    Attends meetings of clubs or organizations: Not on file    Relationship status: Not on file  . Intimate partner violence:    Fear of current or ex partner: Not on file    Emotionally abused: Not on file    Physically abused: Not on file    Forced sexual activity: Not on file  Other Topics Concern  . Not on file  Social History Narrative   Regular exercise: Yes: [employer] 4 times/week    Family History  Problem Relation Age of Onset  . Heart attack Father   . Colon cancer Unknown   . Diabetes Unknown   . Prostate cancer Brother   . Heart disease Unknown   . Coronary artery disease Unknown        Two siblings, status post stenting to siblings with heart disease. One. Status post pacemaker insertion.  . Stroke Brother     ROS General: Negative; No fevers, chills, or night sweats HEENT: Negative; No changes in vision or hearing, sinus congestion, difficulty swallowing Pulmonary: Negative; No cough, wheezing, shortness of breath, hemoptysis Cardiovascular:  See HPI; No chest pain, presyncope,  syncope, palpitations, edema GI:.  Positive for increased gas, diarrhea as well as constipation GU: Negative; No dysuria, hematuria, or difficulty voiding Musculoskeletal: Negative; no myalgias, joint pain, or weakness Hematologic/Oncologic: Negative; no easy bruising, bleeding Endocrine: Negative; no heat/cold intolerance; no diabetes Neuro: Negative; no changes in balance, headaches Skin: Negative;  No rashes or skin lesions Psychiatric: Negative; No behavioral problems, depression Sleep: Negative; No daytime sleepiness, hypersomnolence, bruxism, restless legs, hypnogagnic hallucinations Other comprehensive 14 point system review is negative   Physical Exam BP 134/80   Pulse 88   Ht 6' (1.829 m)   Wt 228 lb 12.8 oz (103.8 kg)   BMI 31.03 kg/m    Repeat blood pressure by me was 110/70  Wt Readings from Last 3 Encounters:  09/03/18 228 lb 12.8 oz (103.8 kg)  07/29/18 228 lb 12.8 oz (103.8 kg)  06/25/18 230 lb 6.4 oz (104.5 kg)   General: Alert, oriented, no distress.  Skin: normal turgor, no rashes, warm and dry HEENT: Normocephalic, atraumatic. Pupils equal round and reactive to light; sclera anicteric; extraocular muscles intact;  Nose without nasal septal hypertrophy Mouth/Parynx benign; Mallinpatti scale 3 Neck: No JVD, no carotid bruits; normal carotid upstroke Lungs: clear to ausculatation and percussion; no wheezing or rales Chest wall: without tenderness to palpitation Heart: PMI not displaced, irregularly irregular with ventricular rate in the 80s, s1 s2 normal, 1/6 systolic murmur, no diastolic murmur, no rubs, gallops, thrills, or heaves Abdomen: soft, nontender; no hepatosplenomehaly, BS+; abdominal aorta nontender and not dilated by palpation. Back: no CVA tenderness Pulses 2+ Musculoskeletal: full range of motion, normal strength, no joint deformities Extremities: trivial ankle edema no clubbing cyanosis, Homan's sign negative  Neurologic: grossly nonfocal;  Cranial nerves grossly wnl Psychologic: Normal mood and affect  ECG (independently read by me): She will fibrillation at 88 bpm.  Nonspecific T wave abnormality.  June 25, 2018 ECG (independently read by me): Atrial fibrillation with ventricular rate of 123 bpm.  Nonspecific T wave abnormality.  December 20, 2017 ECG (independently read by me): Atrial fibrillation with ventricular rate at 97 bpm.  Small Q wave in 3.  Low voltage.  September 2018ECG (independently read by me): Atrial fibrillation with ventricular rate at approximately 100 bpm.  Q wave in aVF; low voltage.  Early transition.  No significant ST changes  March 2018 ECG (independently read by me): Atrial fibrillation with ventricular rate at 104 bpm.  QTc interval 433 ms.  September 2017 ECG (independently read by me): AF at 106 without significant STT changes.  LABS:  BMP Latest Ref Rng & Units 04/19/2018 07/31/2017 07/14/2016  Glucose 65 - 99 mg/dL 94 103(H) 101(H)  BUN 8 - 27 mg/dL 21 20 19   Creatinine 0.76 - 1.27 mg/dL 0.92 1.09 1.25(H)  BUN/Creat Ratio 10 - 24 23 - -  Sodium 134 - 144 mmol/L 140 140 138  Potassium 3.5 - 5.2 mmol/L 4.7 4.7 4.8  Chloride 96 - 106 mmol/L 101 104 104  CO2 20 - 29 mmol/L 20 26 25   Calcium 8.6 - 10.2 mg/dL 9.6 9.6 9.1     Hepatic Function Latest Ref Rng & Units 07/31/2017 07/14/2016 06/16/2016  Total Protein 6.0 - 8.3 g/dL 6.5 6.5 6.5  Albumin 3.5 - 5.2 g/dL 4.1 4.0 4.1  AST 0 - 37 U/L 23 19 19   ALT 0 - 53 U/L 22 16 17   Alk Phosphatase 39 - 117 U/L 83 67 69  Total Bilirubin 0.2 - 1.2 mg/dL 0.6 0.8 0.8  Bilirubin, Direct 0.0 - 0.3 mg/dL - - -    CBC Latest Ref Rng & Units 07/31/2017 06/16/2016 11/18/2015  WBC 4.0 - 10.5 K/uL 7.4 8.8 8.8  Hemoglobin 13.0 - 17.0 g/dL 17.0 17.5(H) 17.0  Hematocrit 39.0 - 52.0 % 50.6 51.8(H) 49.9  Platelets 150.0 - 400.0 K/uL  210.0 203 206   Lab Results  Component Value Date   MCV 96.8 07/31/2017   MCV 95.2 06/16/2016   MCV 94.5 11/18/2015    Lab Results  Component Value Date   TSH 4.810 (H) 04/19/2018   Lab Results  Component Value Date   HGBA1C 5.9 07/31/2017    MG 2.1  BNP    Component Value Date/Time   BNP 27.4 11/18/2015 1024    ProBNP No results found for: PROBNP   Lipid Panel     Component Value Date/Time   CHOL 210 (H) 06/16/2016 0946   TRIG 215 (H) 06/16/2016 0946   TRIG 150 (H) 09/06/2006 1031   HDL 42 06/16/2016 0946   CHOLHDL 5.0 06/16/2016 0946   VLDL 43 (H) 06/16/2016 0946   LDLCALC 125 06/16/2016 0946   LDLDIRECT 126.0 04/19/2015 1000    RADIOLOGY: No results found.   IMPRESSION:  1. Permanent atrial fibrillation   2. Anticoagulation adequate   3. Essential hypertension   4. Mixed hyperlipidemia   5. Peripheral edema     ASSESSMENT AND PLAN: Mr Willner is an 84 year old [place] male who has a history of hypertension, hyperlidemia and permanent AF on coumadin anticoagulation.  His echo in 12/2014 was unchanged from a prior echo of 2011, and ejection fraction remained normal at 55-60%.  His left atrium was moderately dilated which was probably contributed by his atrial fibrillation. He continues to be on warfarin for anticoagulation with INH goal of 2.5. When I saw the patient in March 2018 , I had recommended titration of metoprolol for improved rate control and subsequently increase this to 50 mg twice a day.  He developed some dependent rubor with mild edema  and I suspect this may be contributed by mild venous insufficiency.  He was given a prescription for HCTZ, which he did not take and therefore I recommended support stockings.  Over the last several office visits, his metoprolol was titrated up to its present dose of 100 mg twice a day.  Ventricular rate is now improved in the 80s.  When last seen on 75 mg twice a day ventricular rate is in the 120s.  Remotely he was on Cardizem, there was some concern for gingival hypertrophy.  Presently he is without anginal symptoms.  He denies  bleeding and is tolerating warfarin.  He has not had lipid studies checked since 2017.  Mostly he had seen Dr. Burnice Logan.  And now sees Dr. [name].  I have recommended a complete set of fasting laboratory be obtained at his convenience prior to the end of the year.  His ECG today remains stable with a controlled ventricular response and previously noted nonspecific T wave abnormalities.  QTc interval is normal.  I will see him in 6 months for reevaluation. Time Spent: 25 minutes  Troy Sine, MD, [facility] 09/03/2018 12:41 PM

## 2018-09-11 DIAGNOSIS — I4821 Permanent atrial fibrillation: Secondary | ICD-10-CM | POA: Diagnosis not present

## 2018-09-11 DIAGNOSIS — Z7901 Long term (current) use of anticoagulants: Secondary | ICD-10-CM | POA: Diagnosis not present

## 2018-09-11 DIAGNOSIS — I10 Essential (primary) hypertension: Secondary | ICD-10-CM | POA: Diagnosis not present

## 2018-09-11 DIAGNOSIS — E782 Mixed hyperlipidemia: Secondary | ICD-10-CM | POA: Diagnosis not present

## 2018-09-11 LAB — COMPREHENSIVE METABOLIC PANEL
ALT: 18 IU/L (ref 0–44)
AST: 21 IU/L (ref 0–40)
Albumin/Globulin Ratio: 2 (ref 1.2–2.2)
Albumin: 4.4 g/dL (ref 3.5–4.7)
Alkaline Phosphatase: 91 IU/L (ref 39–117)
BUN/Creatinine Ratio: 17 (ref 10–24)
BUN: 17 mg/dL (ref 8–27)
Bilirubin Total: 0.5 mg/dL (ref 0.0–1.2)
CO2: 24 mmol/L (ref 20–29)
Calcium: 9.4 mg/dL (ref 8.6–10.2)
Chloride: 105 mmol/L (ref 96–106)
Creatinine, Ser: 1.02 mg/dL (ref 0.76–1.27)
GFR calc Af Amer: 76 mL/min/{1.73_m2} (ref >59)
GFR calc non Af Amer: 65 mL/min/{1.73_m2} (ref >59)
Globulin, Total: 2.2 g/dL (ref 1.5–4.5)
Glucose: 94 mg/dL (ref 65–99)
Potassium: 5.2 mmol/L (ref 3.5–5.2)
Sodium: 143 mmol/L (ref 134–144)
Total Protein: 6.6 g/dL (ref 6.0–8.5)

## 2018-09-11 LAB — LIPID PANEL
Chol/HDL Ratio: 5.1 ratio — ABNORMAL HIGH (ref 0.0–5.0)
Cholesterol, Total: 197 mg/dL (ref 100–199)
HDL: 39 mg/dL — ABNORMAL LOW (ref >39)
LDL Calculated: 126 mg/dL — ABNORMAL HIGH (ref 0–99)
Triglycerides: 162 mg/dL — ABNORMAL HIGH (ref 0–149)
VLDL Cholesterol Cal: 32 mg/dL (ref 5–40)

## 2018-09-11 LAB — CBC
Hematocrit: 50.7 % (ref 37.5–51.0)
Hemoglobin: 17.3 g/dL (ref 13.0–17.7)
MCH: 32.1 pg (ref 26.6–33.0)
MCHC: 34.1 g/dL (ref 31.5–35.7)
MCV: 94 fL (ref 79–97)
Platelets: 213 10*3/uL (ref 150–450)
RBC: 5.39 x10E6/uL (ref 4.14–5.80)
RDW: 12.3 % (ref 12.3–15.4)
WBC: 8.1 10*3/uL (ref 3.4–10.8)

## 2018-09-11 LAB — TSH: TSH: 3.21 u[IU]/mL (ref 0.450–4.500)

## 2018-09-11 LAB — MAGNESIUM: Magnesium: 2.2 mg/dL (ref 1.6–2.3)

## 2018-09-18 ENCOUNTER — Ambulatory Visit: Payer: Medicare Other | Admitting: General Practice

## 2018-09-18 DIAGNOSIS — Z7901 Long term (current) use of anticoagulants: Secondary | ICD-10-CM

## 2018-09-18 LAB — POCT INR: INR: 2.9 (ref 2.0–3.0)

## 2018-09-18 NOTE — Patient Instructions (Signed)
Pre visit review using our clinic review tool, if applicable. No additional management support is needed unless otherwise documented below in the visit note.  Continue to take 1 tablet daily except nothing on Wednesdays.  Re-check in 6 weeks.  

## 2018-10-30 ENCOUNTER — Ambulatory Visit (INDEPENDENT_AMBULATORY_CARE_PROVIDER_SITE_OTHER): Payer: Medicare Other | Admitting: General Practice

## 2018-10-30 DIAGNOSIS — Z7901 Long term (current) use of anticoagulants: Secondary | ICD-10-CM

## 2018-10-30 LAB — POCT INR: INR: 2.4 (ref 2.0–3.0)

## 2018-10-30 NOTE — Patient Instructions (Addendum)
Pre visit review using our clinic review tool, if applicable. No additional management support is needed unless otherwise documented below in the visit note.  Continue to take 1 tablet daily except nothing on Wednesdays.  Re-check in 6 weeks.  

## 2018-10-31 ENCOUNTER — Other Ambulatory Visit: Payer: Self-pay | Admitting: Family Medicine

## 2018-10-31 NOTE — Telephone Encounter (Signed)
Forwarding

## 2018-11-08 ENCOUNTER — Encounter: Payer: Self-pay | Admitting: Family Medicine

## 2018-11-08 ENCOUNTER — Ambulatory Visit (INDEPENDENT_AMBULATORY_CARE_PROVIDER_SITE_OTHER): Payer: Medicare Other | Admitting: Family Medicine

## 2018-11-08 ENCOUNTER — Ambulatory Visit (HOSPITAL_COMMUNITY)
Admission: RE | Admit: 2018-11-08 | Discharge: 2018-11-08 | Disposition: A | Discharge status: Home / Self Care | Payer: Medicare Other | Source: Ambulatory Visit | Attending: Family Medicine | Admitting: Family Medicine

## 2018-11-08 VITALS — BP 108/66 | HR 94 | Temp 97.9°F | Ht 72.0 in | Wt 231.4 lb

## 2018-11-08 DIAGNOSIS — S0990XA Unspecified injury of head, initial encounter: Secondary | ICD-10-CM

## 2018-11-08 DIAGNOSIS — W19XXXA Unspecified fall, initial encounter: Secondary | ICD-10-CM | POA: Diagnosis not present

## 2018-11-08 NOTE — Patient Instructions (Signed)
im going to scan your head since you are on coumadin. Just have to make sure you do not have a small bleed in your brain.   -if you have any worsening headaches, nausea/vomiting, vision changes I want you to go to ER

## 2018-11-08 NOTE — Progress Notes (Signed)
Patient: Glen [name] MRN: [id number] DOB: 06-27-1928 PCP: [name], MD     Subjective:  Chief Complaint  Patient presents with  . Head Injury    HPI: The patient is a 85 y.o. male who presents today for s/p head injury yesterday 2/6 after falling in his bathroom. He was at home yesterday and his toilet got clogged. He plunged it twice and the toilet still overflowed. He grabbed some towels and and a bucket and was barefooted. He took a step and hit the floor. He thinks he hit his elbow, butt then fell back and hit his head. He is on coumadin. He states he has a slight headache. No nausea/vomiting.   Review of Systems  Eyes: Negative for visual disturbance.  Neurological: Positive for headaches. Negative for dizziness.  Hematological: Bruises/bleeds easily.  Psychiatric/Behavioral: Negative for confusion.    Allergies Patient is allergic to codeine.  Past Medical History Patient  has a past medical history of Atrial fibrillation ([place]), Diverticulitis, Drug therapy, Family history of diabetes insipidus, FHx: colon cancer, History of colonic polyps, echocardiogram, Hyperlipidemia, Hypertension, Neoplasm of prostate, Obesity, and Palpitation.  Surgical History Patient  has a past surgical history that includes Colonoscopy (December 2005); Anal Fistula Repair; Knee arthroscopy; Tonsillectomy; ETT (1991); Cataract extraction (2004); Cardiac valuve replacement (1996); and 2-D Echocardiogram (November 2009, 2011).  Family History Pateint's family history includes Colon cancer in an other family member; Coronary artery disease in an other family member; Diabetes in an other family member; Heart attack in his father; Heart disease in an other family member; Prostate cancer in his brother; Stroke in his brother.  Social History Patient  reports that he has never smoked. He has never used smokeless tobacco. He reports that he does not drink alcohol.    Objective: Vitals:   11/08/18 1119  BP: 108/66  Pulse: 94  Temp: 97.9 F (36.6 C)  TempSrc: Oral  SpO2: 96%  Weight: 231 lb 6.4 oz (105 kg)  Height: 6' (1.829 m)    Body mass index is 31.38 kg/m.  Physical Exam Vitals signs reviewed.  Constitutional:      Appearance: Normal appearance.  HENT:     Head: Normocephalic and atraumatic.  Neck:     Musculoskeletal: Normal range of motion and neck supple.  Cardiovascular:     Rate and Rhythm: Normal rate and regular rhythm.     Heart sounds: Normal heart sounds.  Pulmonary:     Effort: Pulmonary effort is normal.     Breath sounds: Normal breath sounds.  Abdominal:     General: Abdomen is flat. Bowel sounds are normal.     Palpations: Abdomen is soft.  Neurological:     General: No focal deficit present.     Mental Status: He is alert and oriented to person, place, and time.     Cranial Nerves: No cranial nerve deficit.        Assessment/plan: 1. Fall, initial encounter Exam normal. He has a headache. I am going to scan his heat just to make sure no subdural hematoma with fall onto head on coumadin. Will get this done today. Very strict ER precautions given for n/v, disorientation, vision changes or worsening headache.   2. Head trauma, initial encounter See above.  - CT Head Wo Contrast; Future   Return if symptoms worsen or fail to improve.   Orma Flaming, MD [place]   11/08/2018

## 2018-11-13 NOTE — Progress Notes (Signed)
This encounter was created in error - please disregard.

## 2018-11-20 DIAGNOSIS — Z961 Presence of intraocular lens: Secondary | ICD-10-CM | POA: Diagnosis not present

## 2018-12-03 DIAGNOSIS — L570 Actinic keratosis: Secondary | ICD-10-CM | POA: Diagnosis not present

## 2018-12-03 DIAGNOSIS — L821 Other seborrheic keratosis: Secondary | ICD-10-CM | POA: Diagnosis not present

## 2018-12-03 DIAGNOSIS — Z85828 Personal history of other malignant neoplasm of skin: Secondary | ICD-10-CM | POA: Diagnosis not present

## 2018-12-03 DIAGNOSIS — I781 Nevus, non-neoplastic: Secondary | ICD-10-CM | POA: Diagnosis not present

## 2018-12-11 ENCOUNTER — Ambulatory Visit (INDEPENDENT_AMBULATORY_CARE_PROVIDER_SITE_OTHER): Payer: Medicare Other | Admitting: General Practice

## 2018-12-11 ENCOUNTER — Other Ambulatory Visit: Payer: Self-pay

## 2018-12-11 DIAGNOSIS — Z7901 Long term (current) use of anticoagulants: Secondary | ICD-10-CM | POA: Diagnosis not present

## 2018-12-11 LAB — POCT INR: INR: 1.9 — AB (ref 2.0–3.0)

## 2018-12-11 NOTE — Progress Notes (Signed)
I have reviewed the patient's encounter and agree with the documentation.  Glen [name]. [name], MD 12/11/2018 8:31 AM

## 2018-12-11 NOTE — Patient Instructions (Addendum)
Pre visit review using our clinic review tool, if applicable. No additional management support is needed unless otherwise documented below in the visit note.  Take 1 tablet today and then continue to take 1 tablet daily except nothing on Wednesdays.  Re-check in 4 weeks.

## 2019-01-08 ENCOUNTER — Ambulatory Visit: Payer: Medicare Other

## 2019-01-27 ENCOUNTER — Ambulatory Visit: Payer: Medicare Other | Admitting: Family Medicine

## 2019-02-05 ENCOUNTER — Ambulatory Visit: Payer: Medicare Other

## 2019-02-14 ENCOUNTER — Ambulatory Visit: Payer: Medicare Other | Admitting: Family Medicine

## 2019-02-19 ENCOUNTER — Ambulatory Visit: Payer: Medicare Other

## 2019-03-03 ENCOUNTER — Telehealth: Payer: Medicare Other | Admitting: Cardiovascular Disease

## 2019-03-04 ENCOUNTER — Other Ambulatory Visit: Payer: Self-pay | Admitting: Family Medicine

## 2019-03-04 NOTE — Telephone Encounter (Signed)
Forwarding

## 2019-03-12 ENCOUNTER — Ambulatory Visit: Payer: Medicare Other

## 2019-03-17 ENCOUNTER — Ambulatory Visit: Payer: Medicare Other | Admitting: Family Medicine

## 2019-04-09 ENCOUNTER — Ambulatory Visit: Payer: Medicare Other

## 2019-04-16 ENCOUNTER — Ambulatory Visit: Payer: Medicare Other | Admitting: Family Medicine

## 2019-05-07 ENCOUNTER — Ambulatory Visit: Payer: Medicare Other

## 2019-05-07 ENCOUNTER — Ambulatory Visit: Payer: Medicare Other | Admitting: Family Medicine

## 2019-05-29 ENCOUNTER — Encounter: Payer: Self-pay | Admitting: Family Medicine

## 2019-05-30 ENCOUNTER — Telehealth: Payer: Self-pay | Admitting: General Practice

## 2019-05-30 NOTE — Telephone Encounter (Signed)
Attempted to return call to patient but no answer and no VM.  I believe he was trying to schedule a coumadin clinic appt.  I will try again.

## 2019-06-04 ENCOUNTER — Ambulatory Visit (INDEPENDENT_AMBULATORY_CARE_PROVIDER_SITE_OTHER): Payer: Medicare Other | Admitting: Family Medicine

## 2019-06-04 ENCOUNTER — Other Ambulatory Visit: Payer: Self-pay

## 2019-06-04 ENCOUNTER — Encounter: Payer: Self-pay | Admitting: Family Medicine

## 2019-06-04 ENCOUNTER — Ambulatory Visit (INDEPENDENT_AMBULATORY_CARE_PROVIDER_SITE_OTHER): Payer: Medicare Other

## 2019-06-04 ENCOUNTER — Ambulatory Visit: Payer: Self-pay

## 2019-06-04 VITALS — BP 130/72 | HR 89 | Temp 98.2°F | Ht 72.0 in | Wt 236.2 lb

## 2019-06-04 DIAGNOSIS — I4821 Permanent atrial fibrillation: Secondary | ICD-10-CM | POA: Diagnosis not present

## 2019-06-04 DIAGNOSIS — Z6832 Body mass index (BMI) 32.0-32.9, adult: Secondary | ICD-10-CM | POA: Diagnosis not present

## 2019-06-04 DIAGNOSIS — E669 Obesity, unspecified: Secondary | ICD-10-CM

## 2019-06-04 DIAGNOSIS — M545 Low back pain, unspecified: Secondary | ICD-10-CM

## 2019-06-04 DIAGNOSIS — I4891 Unspecified atrial fibrillation: Secondary | ICD-10-CM

## 2019-06-04 DIAGNOSIS — S3992XA Unspecified injury of lower back, initial encounter: Secondary | ICD-10-CM | POA: Diagnosis not present

## 2019-06-04 NOTE — Progress Notes (Signed)
   Chief Complaint:  Glen [name] is a 85 y.o. male who presents for same day appointment with a chief complaint of back pain.   Assessment/Plan:  Back Pain No red flags.  X-ray without obvious fracture based on my read.  Likely has a small contusion.  Patient does not want any pain medications at this point as he says they typically do not work for him.  He will continue using over-the-counter Tylenol.  Also recommended lidocaine patches as needed.  Discussed reasons to return to care.  Follow-up as needed.  Diarrhea No red flags.  Likely diet related.  Recommended Imodium as needed.  Atrial fibrillation Continue metoprolol 100 mg twice daily and warfarin.  We will follow-up with cardiology in the next week.  Body mass index is 32.03 kg/m. / Obese BMI Metric Follow Up - 06/04/19 1032      BMI Metric Follow Up-Please document annually   BMI Metric Follow Up  Education provided           Subjective:  HPI:  Back Pain, acute problem Last night, patient fell out of bed at home.  Hit right side of his back.  Had severe pain.  No weakness or numbness.  Patient did not lose consciousness or hit his head. States that he landed directly on his bed. Thinks that he tripped over his side table. No bowel or bladder incontinence. No urinary retention.  He is also had some diarrhea.  He thinks that it is mostly diet related. Has tried taking pepto bismol with no significant improvement.  No melena or hematochezia.  No other obvious alleviating or aggravating factors.   Patient is currently taking warfarin and metoprolol for his atrial fibrillation.  Been tolerating both these medications well without side effects.  No reported missed doses.   ROS: Per HPI  PMH: He reports that he has never smoked. He has never used smokeless tobacco. He reports that he does not drink alcohol. No history on file for drug.      Objective:  Physical Exam: BP 130/72   Pulse 89   Temp 98.2 F (36.8 C)    Ht 6' (1.829 m)   Wt 236 lb 3.2 oz (107.1 kg)   SpO2 95%   BMI 32.03 kg/m   Gen: NAD, resting comfortably GI: Normal bowel sounds present. Soft, Nontender, Nondistended. MSK: No edema, cyanosis, or clubbing noted -Back: No deformities.  Full range of motion.  Mildly tender palpation along the lower lumbar paraspinal muscles. Skin: Warm, dry Neuro: Grossly normal, moves all extremities Psych: Normal affect and thought content      Caleb M. [name], MD 06/04/2019 10:33 AM

## 2019-06-04 NOTE — Patient Instructions (Signed)
It was very nice to see you today!  You have no broken bones on your xray.  Please take tylenol as needed for your pain.  You can try taking imodium for your diarrhea.  Let me know if your symptoms worsen or do not improve over the next week or two.   Take care, Dr [name]  Please try these tips to maintain a healthy lifestyle:   Eat at least 3 REAL meals and 1-2 snacks per day.  Aim for no more than 5 hours between eating.  If you eat breakfast, please do so within one hour of getting up.    Obtain twice as many fruits/vegetables as protein or carbohydrate foods for both lunch and dinner. (Half of each meal should be fruits/vegetables, one quarter protein, and one quarter starchy carbs)   Cut down on sweet beverages. This includes juice, soda, and sweet tea.    Exercise at least 150 minutes every week.

## 2019-06-04 NOTE — Telephone Encounter (Signed)
Pt. Reports he fell last night at home, getting out of bed. Hit the right side of his back. Pain is 8/10. No numbness or weakness. Requesting and in person appointment. Per [place], will send triage for review. Pt. Reports if pain worsen he "will go somewhere else." Please advise pt.  Answer Assessment - Initial Assessment Questions 1. ONSET: "When did the pain begin?"      Last night - fell at home 2. LOCATION: "Where does it hurt?" (upper, mid or lower back)     Right side of back 3. SEVERITY: "How bad is the pain?"  (e.g., Scale 1-10; mild, moderate, or severe)   - MILD (1-3): doesn't interfere with normal activities    - MODERATE (4-7): interferes with normal activities or awakens from sleep    - SEVERE (8-10): excruciating pain, unable to do any normal activities      8 4. PATTERN: "Is the pain constant?" (e.g., yes, no; constant, intermittent)      Constant 5. RADIATION: "Does the pain shoot into your legs or elsewhere?"     No 6. CAUSE:  "What do you think is causing the back pain?"      Fell 7. BACK OVERUSE:  "Any recent lifting of heavy objects, strenuous work or exercise?"     No 8. MEDICATIONS: "What have you taken so far for the pain?" (e.g., nothing, acetaminophen, NSAIDS)     No 9. NEUROLOGIC SYMPTOMS: "Do you have any weakness, numbness, or problems with bowel/bladder control?"     No 10. OTHER SYMPTOMS: "Do you have any other symptoms?" (e.g., fever, abdominal pain, burning with urination, blood in urine)       No 11. PREGNANCY: "Is there any chance you are pregnant?" (e.g., yes, no; LMP)       n/a  Protocols used: BACK PAIN-A-AH

## 2019-06-04 NOTE — Assessment & Plan Note (Signed)
Continue metoprolol 100 mg twice daily and warfarin.  We will follow-up with cardiology in the next week.

## 2019-06-04 NOTE — Telephone Encounter (Signed)
Please schedule patient as soon as possible.

## 2019-06-05 NOTE — Progress Notes (Signed)
Please inform patient of the following:  Radiologist did not find any additional findings on his xray. Would like for him to continue with the plan we discussed and let me know if not improving.  Glen [name]. [name], MD 06/05/2019 10:09 AM

## 2019-06-17 ENCOUNTER — Encounter: Payer: Self-pay | Admitting: Family Medicine

## 2019-06-18 ENCOUNTER — Telehealth: Payer: Self-pay | Admitting: Family Medicine

## 2019-06-18 NOTE — Telephone Encounter (Signed)
I left a message asking the patient to call me and confirm whether or not we can schedule his AWV with Loma Sousa after his appointment w/ Dr. [name] on 9/23.  The patient called me back and confirmed. VDM (Dee-Dee)

## 2019-06-24 ENCOUNTER — Telehealth: Payer: Self-pay | Admitting: Family Medicine

## 2019-06-24 NOTE — Telephone Encounter (Signed)
FYI

## 2019-06-24 NOTE — Telephone Encounter (Signed)
Copied from [place] [id number]. Topic: General - Other >> Jun 24, 2019  2:35 PM Leward Quan A wrote: Reason for CRM: Patient wife called to inform Dr [name] say that he have been getting really anxious, also confused. She is concerned and want to know if there is something they family should be looking for in the change in his mood or behavior. She is asking that the patient is not made aware that she reached out to Dr [name]. Please advise patient will be in for his appointment on 06/25/2019.

## 2019-06-25 ENCOUNTER — Encounter: Payer: Self-pay | Admitting: Family Medicine

## 2019-06-25 ENCOUNTER — Ambulatory Visit (INDEPENDENT_AMBULATORY_CARE_PROVIDER_SITE_OTHER): Payer: Medicare Other | Admitting: General Practice

## 2019-06-25 ENCOUNTER — Other Ambulatory Visit: Payer: Self-pay

## 2019-06-25 ENCOUNTER — Ambulatory Visit (INDEPENDENT_AMBULATORY_CARE_PROVIDER_SITE_OTHER): Payer: Medicare Other

## 2019-06-25 ENCOUNTER — Ambulatory Visit (INDEPENDENT_AMBULATORY_CARE_PROVIDER_SITE_OTHER): Payer: Medicare Other | Admitting: Family Medicine

## 2019-06-25 VITALS — BP 110/64 | HR 102 | Temp 97.7°F | Ht 72.0 in | Wt 232.2 lb

## 2019-06-25 VITALS — BP 110/64 | Temp 97.7°F | Ht 72.0 in | Wt 232.1 lb

## 2019-06-25 DIAGNOSIS — Z23 Encounter for immunization: Secondary | ICD-10-CM | POA: Diagnosis not present

## 2019-06-25 DIAGNOSIS — R609 Edema, unspecified: Secondary | ICD-10-CM

## 2019-06-25 DIAGNOSIS — I4891 Unspecified atrial fibrillation: Secondary | ICD-10-CM

## 2019-06-25 DIAGNOSIS — I10 Essential (primary) hypertension: Secondary | ICD-10-CM

## 2019-06-25 DIAGNOSIS — D696 Thrombocytopenia, unspecified: Secondary | ICD-10-CM | POA: Insufficient documentation

## 2019-06-25 DIAGNOSIS — Z7901 Long term (current) use of anticoagulants: Secondary | ICD-10-CM

## 2019-06-25 DIAGNOSIS — M1990 Unspecified osteoarthritis, unspecified site: Secondary | ICD-10-CM | POA: Diagnosis not present

## 2019-06-25 DIAGNOSIS — Z0000 Encounter for general adult medical examination without abnormal findings: Secondary | ICD-10-CM | POA: Diagnosis not present

## 2019-06-25 LAB — POCT INR: INR: 2.9 (ref 2.0–3.0)

## 2019-06-25 NOTE — Assessment & Plan Note (Signed)
At goal.  Continue metoprolol tartrate 100 mg twice daily.

## 2019-06-25 NOTE — Assessment & Plan Note (Signed)
Improving.  Continue conservative management.

## 2019-06-25 NOTE — Assessment & Plan Note (Signed)
Check CBC with next blood draw 

## 2019-06-25 NOTE — Progress Notes (Signed)
   Chief Complaint:  Glen [name] is a 85 y.o. male who presents today with a chief complaint of essential hypertenstion.   Assessment/Plan:  Peripheral edema Improving.  Continue conservative management.  Atrial fibrillation Continue metoprolol 100 mg twice daily and anticoagulation with warfarin.  Will follow-up with cardiology in a few months.  Essential hypertension At goal.  Continue metoprolol tartrate 100 mg twice daily.  Thrombocytopenia (HCC) Check CBC with next blood draw.  Osteoarthritis Injection performed today.  Patient tolerated well.  See below procedure note.  Continue ice and over-the-counter analgesics as needed.  Preventative health care Flu vaccine given today.    Subjective:  HPI:  His stable, chronic medical conditions are outlined below:  # Essential Hypertension / Atrial Fibrillation - Follows with [facility] - On metoprolol tartrate 100mg  twice daily - Anticoagulated on warfarin - ROS: No reported chest pain or shortness of breath  # Peripheral Edema - Not currently on any medications - Trying to work on keeping legs elevated and avoiding salt  # Osteoarthritis - Uses OTC analgesics as needed.   -Has had slightly worsening pain in his right knee.  ROS: Per HPI  PMH: He reports that he has never smoked. He has never used smokeless tobacco. He reports that he does not drink alcohol. No history on file for drug.      Objective:  Physical Exam: BP 110/64   Pulse (!) 102   Temp 97.7 F (36.5 C) (Oral)   Ht 6' (1.829 m)   Wt 232 lb 4 oz (105.3 kg)   SpO2 95%   BMI 31.50 kg/m   Wt Readings from Last 3 Encounters:  06/25/19 232 lb 2.3 oz (105.3 kg)  06/25/19 232 lb 4 oz (105.3 kg)  06/04/19 236 lb 3.2 oz (107.1 kg)  Gen: NAD, resting comfortably CV: Irregular with no murmurs appreciated Pulm: Normal work of breathing, clear to auscultation bilaterally with no crackles, wheezes, or rhonchi MSK: Trace pretibial edema bilaterally.   Bilateral onychomycosis.  Mild effusion in right knee.  Neurovascular intact distally. Skin: Warm, dry Neuro: Grossly normal, moves all extremities Psych: Normal affect and thought content  Results for orders placed or performed in visit on 06/25/19 (from the past 24 hour(s))  POCT INR     Status: None   Collection Time: 06/25/19 12:00 AM  Result Value Ref Range   INR 2.9 2.0 - 3.0    Knee Injection Procedure Note  Indication: Symptom relief of right Knee Pain.  Procedure Details  Verbal consent was obtained for the procedure. The joint was prepped with Betadine. Topical ethyl chloride was applied for anesthesia. A 22 gauge needle was inserted into the medial aspect of the joint.  3 ml 1% lidocaine and 1 ml of 80mg /cc Depo-Medrol was then injected into the joint. The needle was removed and the area cleansed and dressed.  Complications:  None; patient tolerated the procedure well.       [name]. [name], MD 06/25/2019 10:17 AM

## 2019-06-25 NOTE — Progress Notes (Signed)
Subjective:   Glen [name] is a 85 y.o. male who presents for Medicare Annual/Subsequent preventive examination.  Review of Systems:   Cardiac Risk Factors include: advanced age (>26men, >73 women);hypertension;male gender     Objective:    Vitals: BP 110/64   Temp 97.7 F (36.5 C) (Temporal)   Ht 6' (1.829 m)   Wt 232 lb 2.3 oz (105.3 kg)   BMI 31.48 kg/m   Body mass index is 31.48 kg/m.  Advanced Directives 06/25/2019 05/27/2013  Does Patient Have a Medical Advance Directive? Yes Patient has advance directive, copy not in chart  Type of Advance Directive Living will;Healthcare Power of [place] will  Does patient want to make changes to medical advance directive? No - Patient declined -  Copy of [place] in Chart? No - copy requested Copy requested from other (Comment)  Pre-existing out of facility DNR order (yellow form or pink MOST form) - No    Tobacco Social History   Tobacco Use  Smoking Status Never Smoker  Smokeless Tobacco Never Used     Counseling given: Not Answered   Clinical Intake:  Pre-visit preparation completed: Yes  Diabetes: No  How often do you need to have someone help you when you read instructions, pamphlets, or other written materials from your doctor or pharmacy?: 2 - Rarely  Interpreter Needed?: No  Information entered by :: Denman George LPN  Past Medical History:  Diagnosis Date  . Atrial fibrillation ([place])   . Diverticulitis   . Drug therapy    Anticoagulation Therapy  . Family history of diabetes insipidus   . FHx: colon cancer   . History of colonic polyps   . Hx of echocardiogram    a. Echo 5/11:  EF 55-60%, mild dilated ascending aorta, mod LAE  . Hyperlipidemia   . Hypertension   . Neoplasm of prostate    special screening malignant   . Obesity   . Palpitation    Past Surgical History:  Procedure Laterality Date  . 2-D Echocardiogram  November 2009, 2011  . Anal Fistula Repair     . CARDIAC VALVE SURGERY  1996   status post [facility]  . CATARACT EXTRACTION  2004  . COLONOSCOPY  December 2005  . ETT  1991   Negative  . KNEE ARTHROSCOPY     Right  . TONSILLECTOMY     Family History  Problem Relation Age of Onset  . Heart attack Father   . Colon cancer Other   . Diabetes Other   . Prostate cancer Brother   . Heart disease Other   . Coronary artery disease Other        Two siblings, status post stenting to siblings with heart disease. One. Status post pacemaker insertion.  . Stroke Brother    Social History   Socioeconomic History  . Marital status: Married    Spouse name: Not on file  . Number of children: Not on file  . Years of education: Not on file  . Highest education level: Not on file  Occupational History  . Occupation: Retired    Fish farm manager: RETIRED  Social Needs  . Financial resource strain: Not on file  . Food insecurity    Worry: Not on file    Inability: Not on file  . Transportation needs    Medical: Not on file    Non-medical: Not on file  Tobacco Use  . Smoking status: Never Smoker  .  Smokeless tobacco: Never Used  Substance and Sexual Activity  . Alcohol use: No  . Drug use: Not on file  . Sexual activity: Not on file  Lifestyle  . Physical activity    Days per week: Not on file    Minutes per session: Not on file  . Stress: Not on file  Relationships  . Social Herbalist on phone: Not on file    Gets together: Not on file    Attends religious service: Not on file    Active member of club or organization: Not on file    Attends meetings of clubs or organizations: Not on file    Relationship status: Not on file  Other Topics Concern  . Not on file  Social History Narrative   Regular exercise: Yes: [employer] 4 times/week    Outpatient Encounter Medications as of 06/25/2019  Medication Sig  . acetaminophen (TYLENOL) 500 MG tablet Take 1,000 mg by mouth every 6 (six) hours as needed for pain.    . metoprolol tartrate (LOPRESSOR) 100 MG tablet Take 1 tablet (100 mg total) by mouth 2 (two) times daily.  Marland Kitchen warfarin (COUMADIN) 5 MG tablet TAKE 1 TABLET BY MOUTH 6 DAYS A WEEK - SKIP Wednesday OR TAKE AS DIRECTED BY ANTICOAGULATION CLINIC   No facility-administered encounter medications on file as of 06/25/2019.     Activities of Daily Living In your present state of health, do you have any difficulty performing the following activities: 06/25/2019  Hearing? Y  Vision? N  Difficulty concentrating or making decisions? N  Walking or climbing stairs? Y  Dressing or bathing? N  Doing errands, shopping? N  Preparing Food and eating ? N  Using the Toilet? N  In the past six months, have you accidently leaked urine? N  Do you have problems with loss of bowel control? N  Managing your Medications? N  Managing your Finances? N  Housekeeping or managing your Housekeeping? N  Some recent data might be hidden    Patient Care Team: [name], MD as PCP - General (Family Medicine) Verl Blalock, Marijo Conception, MD (Inactive) (Cardiology) Darlin Coco, MD as Consulting Physician (Cardiology)   Assessment:   This is a routine wellness examination for Glen [name].  Exercise Activities and Dietary recommendations Current Exercise Habits: Home exercise routine, Type of exercise: walking, Time (Minutes): 30, Frequency (Times/Week): 3, Weekly Exercise (Minutes/Week): 90, Intensity: Mild, Exercise limited by: orthopedic condition(s)  Goals    . LIFESTYLE - DECREASE FALLS RISK       Fall Risk Fall Risk  06/25/2019 06/04/2019 07/29/2018 04/24/2016 04/19/2015  Falls in the past year? 1 1 No Yes No  Number falls in past yr: 1 - - 2 or more -  Injury with Fall? 1 1 - No -  Risk for fall due to : History of fall(s);Impaired balance/gait;Impaired mobility - - - -  Follow up Education provided;Falls prevention discussed;Falls evaluation completed - - - -   Is the patient's home free of loose throw rugs in  walkways, pet beds, electrical cords, etc?   yes      Grab bars in the bathroom? yes      Handrails on the stairs?   yes      Adequate lighting?   yes  Timed Get Up and Go Performed: completed and within normal timeframe; utilizes the use of a cane   Depression Screen PHQ 2/9 Scores 06/25/2019 07/29/2018 04/24/2016 04/19/2015  PHQ - 2 Score 0  0 0 0    Cognitive Function- no cognitive concerns at this time    6CIT Screen 06/25/2019  What Year? 0 points  What month? 0 points  What time? 0 points  Count back from 20 0 points  Months in reverse 0 points  Repeat phrase 2 points  Total Score 2    Immunization History  Administered Date(s) Administered  . Fluad Quad(high Dose 65+) 06/25/2019  . Influenza Split 08/23/2011, 06/25/2012  . Influenza Whole 10/02/2005, 08/11/2009, 08/04/2010  . Influenza, High Dose Seasonal PF 07/15/2015, 06/26/2016, 07/23/2017, 07/03/2018  . Influenza,inj,Quad PF,6+ Mos 07/07/2013, 06/29/2014  . Pneumococcal Conjugate-13 10/17/2013  . Pneumococcal Polysaccharide-23 10/02/2005  . Td 03/17/2009    Qualifies for Shingles Vaccine? Discussed and patient will check with pharmacy for coverage.  Patient education handout provided   Screening Tests Health Maintenance  Topic Date Due  . TETANUS/TDAP  03/18/2019  . INFLUENZA VACCINE  Completed  . PNA vac Low Risk Adult  Completed   Cancer Screenings: Lung: Low Dose CT Chest recommended if Age 59-80 years, 30 pack-year currently smoking OR have quit w/in 15years. Patient does not qualify. Colorectal: not indicated       Plan:  I have personally reviewed and addressed the Medicare Annual Wellness questionnaire and have noted the following in the patient's chart:  A. Medical and social history B. Use of alcohol, tobacco or illicit drugs  C. Current medications and supplements D. Functional ability and status E.  Nutritional status F.  Physical activity G. Advance directives H. List of other physicians  I.  Hospitalizations, surgeries, and ER visits in previous 12 months J.  [place] such as hearing and vision if needed, cognitive and depression L. Referrals, records requested, and appointments- none  In addition, I have reviewed and discussed with patient certain preventive protocols, quality metrics, and best practice recommendations. A written personalized care plan for preventive services as well as general preventive health recommendations were provided to patient.   Signed,  Denman George, LPN  Nurse Health Advisor   Nurse Notes: no additional

## 2019-06-25 NOTE — Patient Instructions (Addendum)
Pre visit review using our clinic review tool, if applicable. No additional management support is needed unless otherwise documented below in the visit note.  Continue to take 1 tablet daily except nothing on Wednesdays.  Re-check in 6 weeks.  

## 2019-06-25 NOTE — Progress Notes (Signed)
I have personally reviewed the Medicare Annual Wellness Visit and agree with the assessment and plan.  [name]. [name], MD 06/25/2019 11:45 AM

## 2019-06-25 NOTE — Telephone Encounter (Signed)
Looks like his wife is on his DPR - Patient did not voice any concerns today. Recommend that they continue to monitor for now and let me know if symptoms worsens or if he has any dangerous behaviors like leaving the stove on, getting lost, etc. The best thing the family can do is to provide structure and familiar faces. Also recommend that he continue to do something mentally stimulating (such as reading a book, puzzles, etc) every day.  Glen [name]. [name], MD 06/25/2019 11:48 AM

## 2019-06-25 NOTE — Assessment & Plan Note (Signed)
Continue metoprolol 100 mg twice daily and anticoagulation with warfarin.  Will follow-up with cardiology in a few months.

## 2019-06-25 NOTE — Patient Instructions (Signed)
[name] , Thank you for taking time to come for your Medicare Wellness Visit. I appreciate your ongoing commitment to your health goals. Please review the following plan we discussed and let me know if I can assist you in the future.   Screening recommendations/referrals: Colorectal Screening: not indicated   Vision and Dental Exams: Recommended annual ophthalmology exams for early detection of glaucoma and other disorders of the eye Recommended annual dental exams for proper oral hygiene  Vaccinations: Influenza vaccine: today  Pneumococcal vaccine: up to date; last 10/17/13 (Dr. [name] may recommend a booster next year)  Tdap vaccine: Please call your insurance company to determine your out of pocket expense. You may also receive this vaccine at your local pharmacy or Health Dept. (Recommended every 10 years)  Shingles vaccine: Please call your insurance company to determine your out of pocket expense for the Shingrix vaccine. You may receive this vaccine at your local pharmacy. (see attached information)   Advanced directives: Please bring a copy of your POA (Power of Attorney) and/or Living Will to your next appointment.  Goals: Recommend to drink at least 6-8 8oz glasses of water per day and remove any items from the home that may cause slips or trips.  Next appointment: Please schedule your Annual Wellness Visit with your Nurse Health Advisor in one year.  Preventive Care 37 Years and Older, Male Preventive care refers to lifestyle choices and visits with your health care provider that can promote health and wellness. What does preventive care include?  A yearly physical exam. This is also called an annual well check.  Dental exams once or twice a year.  Routine eye exams. Ask your health care provider how often you should have your eyes checked.  Personal lifestyle choices, including:  Daily care of your teeth and gums.  Regular physical activity.  Eating a healthy  diet.  Avoiding tobacco and drug use.  Limiting alcohol use.  Practicing safe sex.  Taking low doses of aspirin every day if recommended by your health care provider..  Taking vitamin and mineral supplements as recommended by your health care provider. What happens during an annual well check? The services and screenings done by your health care provider during your annual well check will depend on your age, overall health, lifestyle risk factors, and family history of disease. Counseling  Your health care provider may ask you questions about your:  Alcohol use.  Tobacco use.  Drug use.  Emotional well-being.  Home and relationship well-being.  Sexual activity.  Eating habits.  History of falls.  Memory and ability to understand (cognition).  Work and work Statistician. Screening  You may have the following tests or measurements:  Height, weight, and BMI.  Blood pressure.  Lipid and cholesterol levels. These may be checked every 5 years, or more frequently if you are over 74 years old.  Skin check.  Lung cancer screening. You may have this screening every year starting at age 65 if you have a 30-pack-year history of smoking and currently smoke or have quit within the past 15 years.  Fecal occult blood test (FOBT) of the stool. You may have this test every year starting at age 84.  Flexible sigmoidoscopy or colonoscopy. You may have a sigmoidoscopy every 5 years or a colonoscopy every 10 years starting at age 54.  Prostate cancer screening. Recommendations will vary depending on your family history and other risks.  Hepatitis C blood test.  Hepatitis B blood test.  Sexually transmitted  disease (STD) testing.  Diabetes screening. This is done by checking your blood sugar (glucose) after you have not eaten for a while (fasting). You may have this done every 1-3 years.  Abdominal aortic aneurysm (AAA) screening. You may need this if you are a current or former  smoker.  Osteoporosis. You may be screened starting at age 60 if you are at high risk. Talk with your health care provider about your test results, treatment options, and if necessary, the need for more tests. Vaccines  Your health care provider may recommend certain vaccines, such as:  Influenza vaccine. This is recommended every year.  Tetanus, diphtheria, and acellular pertussis (Tdap, Td) vaccine. You may need a Td booster every 10 years.  Zoster vaccine. You may need this after age 26.  Pneumococcal 13-valent conjugate (PCV13) vaccine. One dose is recommended after age 75.  Pneumococcal polysaccharide (PPSV23) vaccine. One dose is recommended after age 81. Talk to your health care provider about which screenings and vaccines you need and how often you need them. This information is not intended to replace advice given to you by your health care provider. Make sure you discuss any questions you have with your health care provider. Document Released: 10/15/2015 Document Revised: 06/07/2016 Document Reviewed: 07/20/2015 Elsevier Interactive Patient Education  2017 [place] Prevention in the Home Falls can cause injuries. They can happen to people of all ages. There are many things you can do to make your home safe and to help prevent falls. What can I do on the outside of my home?  Regularly fix the edges of walkways and driveways and fix any cracks.  Remove anything that might make you trip as you walk through a door, such as a raised step or threshold.  Trim any bushes or trees on the path to your home.  Use bright outdoor lighting.  Clear any walking paths of anything that might make someone trip, such as rocks or tools.  Regularly check to see if handrails are loose or broken. Make sure that both sides of any steps have handrails.  Any raised decks and porches should have guardrails on the edges.  Have any leaves, snow, or ice cleared regularly.  Use sand or  salt on walking paths during winter.  Clean up any spills in your garage right away. This includes oil or grease spills. What can I do in the bathroom?  Use night lights.  Install grab bars by the toilet and in the tub and shower. Do not use towel bars as grab bars.  Use non-skid mats or decals in the tub or shower.  If you need to sit down in the shower, use a plastic, non-slip stool.  Keep the floor dry. Clean up any water that spills on the floor as soon as it happens.  Remove soap buildup in the tub or shower regularly.  Attach bath mats securely with double-sided non-slip rug tape.  Do not have throw rugs and other things on the floor that can make you trip. What can I do in the bedroom?  Use night lights.  Make sure that you have a light by your bed that is easy to reach.  Do not use any sheets or blankets that are too big for your bed. They should not hang down onto the floor.  Have a firm chair that has side arms. You can use this for support while you get dressed.  Do not have throw rugs and other things on  the floor that can make you trip. What can I do in the kitchen?  Clean up any spills right away.  Avoid walking on wet floors.  Keep items that you use a lot in easy-to-reach places.  If you need to reach something above you, use a strong step stool that has a grab bar.  Keep electrical cords out of the way.  Do not use floor polish or wax that makes floors slippery. If you must use wax, use non-skid floor wax.  Do not have throw rugs and other things on the floor that can make you trip. What can I do with my stairs?  Do not leave any items on the stairs.  Make sure that there are handrails on both sides of the stairs and use them. Fix handrails that are broken or loose. Make sure that handrails are as long as the stairways.  Check any carpeting to make sure that it is firmly attached to the stairs. Fix any carpet that is loose or worn.  Avoid having  throw rugs at the top or bottom of the stairs. If you do have throw rugs, attach them to the floor with carpet tape.  Make sure that you have a light switch at the top of the stairs and the bottom of the stairs. If you do not have them, ask someone to add them for you. What else can I do to help prevent falls?  Wear shoes that:  Do not have high heels.  Have rubber bottoms.  Are comfortable and fit you well.  Are closed at the toe. Do not wear sandals.  If you use a stepladder:  Make sure that it is fully opened. Do not climb a closed stepladder.  Make sure that both sides of the stepladder are locked into place.  Ask someone to hold it for you, if possible.  Clearly mark and make sure that you can see:  Any grab bars or handrails.  First and last steps.  Where the edge of each step is.  Use tools that help you move around (mobility aids) if they are needed. These include:  Canes.  Walkers.  Scooters.  Crutches.  Turn on the lights when you go into a dark area. Replace any light bulbs as soon as they burn out.  Set up your furniture so you have a clear path. Avoid moving your furniture around.  If any of your floors are uneven, fix them.  If there are any pets around you, be aware of where they are.  Review your medicines with your doctor. Some medicines can make you feel dizzy. This can increase your chance of falling. Ask your doctor what other things that you can do to help prevent falls. This information is not intended to replace advice given to you by your health care provider. Make sure you discuss any questions you have with your health care provider. Document Released: 07/15/2009 Document Revised: 02/24/2016 Document Reviewed: 10/23/2014 Elsevier Interactive Patient Education  2017 [employer].

## 2019-06-25 NOTE — Assessment & Plan Note (Signed)
Injection performed today.  Patient tolerated well.  See below procedure note.  Continue ice and over-the-counter analgesics as needed.

## 2019-06-25 NOTE — Patient Instructions (Signed)
It was very nice to see you today!  We gave you a cortisone injection today.  Please keep ice on the area for the next day or 2.  No medication changes today.  Come back to see me in 3 to 6 months.  We will check blood work at that time.  Take care, Dr [name]  Please try these tips to maintain a healthy lifestyle:   Eat at least 3 REAL meals and 1-2 snacks per day.  Aim for no more than 5 hours between eating.  If you eat breakfast, please do so within one hour of getting up.    Obtain twice as many fruits/vegetables as protein or carbohydrate foods for both lunch and dinner. (Half of each meal should be fruits/vegetables, one quarter protein, and one quarter starchy carbs)   Cut down on sweet beverages. This includes juice, soda, and sweet tea.    Exercise at least 150 minutes every week.

## 2019-06-26 NOTE — Telephone Encounter (Signed)
Pt's wife called back. °

## 2019-06-26 NOTE — Telephone Encounter (Signed)
Notified Mrs.Alvelo

## 2019-06-26 NOTE — Telephone Encounter (Signed)
Called and left VM requesting that [facility] call the office back.

## 2019-07-09 ENCOUNTER — Ambulatory Visit: Payer: Medicare Other | Admitting: Family Medicine

## 2019-07-09 ENCOUNTER — Ambulatory Visit: Payer: Medicare Other

## 2019-08-06 ENCOUNTER — Ambulatory Visit: Payer: Medicare Other

## 2019-08-13 ENCOUNTER — Ambulatory Visit: Payer: Medicare Other

## 2019-09-02 ENCOUNTER — Ambulatory Visit: Payer: Medicare Other | Admitting: Cardiovascular Disease

## 2019-09-10 ENCOUNTER — Ambulatory Visit: Payer: Medicare Other

## 2019-09-22 ENCOUNTER — Other Ambulatory Visit: Payer: Self-pay | Admitting: Cardiovascular Disease

## 2019-09-22 ENCOUNTER — Other Ambulatory Visit: Payer: Self-pay | Admitting: Family Medicine

## 2019-11-19 ENCOUNTER — Ambulatory Visit: Payer: Medicare Other

## 2019-11-25 ENCOUNTER — Ambulatory Visit: Payer: Medicare Other | Admitting: Family Medicine

## 2019-12-02 ENCOUNTER — Other Ambulatory Visit: Payer: Self-pay

## 2019-12-03 ENCOUNTER — Encounter: Payer: Self-pay | Admitting: Family Medicine

## 2019-12-03 ENCOUNTER — Ambulatory Visit (INDEPENDENT_AMBULATORY_CARE_PROVIDER_SITE_OTHER): Payer: Medicare Other | Admitting: Family Medicine

## 2019-12-03 ENCOUNTER — Ambulatory Visit (INDEPENDENT_AMBULATORY_CARE_PROVIDER_SITE_OTHER): Payer: Medicare Other | Admitting: General Practice

## 2019-12-03 VITALS — BP 110/62 | HR 95 | Temp 97.0°F | Ht 72.0 in | Wt 229.2 lb

## 2019-12-03 DIAGNOSIS — I10 Essential (primary) hypertension: Secondary | ICD-10-CM

## 2019-12-03 DIAGNOSIS — Z7901 Long term (current) use of anticoagulants: Secondary | ICD-10-CM | POA: Diagnosis not present

## 2019-12-03 DIAGNOSIS — I4891 Unspecified atrial fibrillation: Secondary | ICD-10-CM | POA: Diagnosis not present

## 2019-12-03 LAB — POCT INR: INR: 4.3 — AB (ref 2.0–3.0)

## 2019-12-03 NOTE — Progress Notes (Signed)
   Glen [name] is a 86 y.o. male who presents today for an office visit.  Assessment/Plan:  Chronic Problems Addressed Today: Atrial fibrillation Rate controlled with metoprolol 100 mg twice daily.  INR 4.3 today.  Please see associated coag clinic note.  Patient states he no longer wants to be on warfarin.  He will follow-up with cardiology later this week.  Patient is aware of increased risk of stroke coming off anticoagulation.  Essential hypertension At goal.  Continue metoprolol tartrate 100 mg twice daily.    Subjective:  HPI:  See A/p.         Objective:  Physical Exam: BP 110/62   Pulse 95   Temp (!) 97 F (36.1 C)   Ht 6' (1.829 m)   Wt 229 lb 4 oz (104 kg)   SpO2 96%   BMI 31.09 kg/m   Gen: No acute distress, resting comfortably CV: Irregular rate and rhythm with no murmurs appreciated Pulm: Normal work of breathing, clear to auscultation bilaterally with no crackles, wheezes, or rhonchi       Glen [name] M. [name], MD 12/03/2019 8:54 AM

## 2019-12-03 NOTE — Progress Notes (Signed)
I have reviewed the patient's encounter and agree with the documentation.  [name]. [name], MD 12/03/2019 8:53 AM

## 2019-12-03 NOTE — Assessment & Plan Note (Signed)
At goal.  Continue metoprolol tartrate 100 mg twice daily.

## 2019-12-03 NOTE — Assessment & Plan Note (Signed)
Rate controlled with metoprolol 100 mg twice daily.  INR 4.3 today.  Please see associated coag clinic note.  Patient states he no longer wants to be on warfarin.  He will follow-up with cardiology later this week.  Patient is aware of increased risk of stroke coming off anticoagulation.

## 2019-12-03 NOTE — Patient Instructions (Signed)
It was very nice to see you today!  No changes today.  Come back in 6 months for your annual wellness visit, or sooner if needed.  Take care, Dr [name]  Please try these tips to maintain a healthy lifestyle:   Eat at least 3 REAL meals and 1-2 snacks per day.  Aim for no more than 5 hours between eating.  If you eat breakfast, please do so within one hour of getting up.    Each meal should contain half fruits/vegetables, one quarter protein, and one quarter carbs (no bigger than a computer mouse)   Cut down on sweet beverages. This includes juice, soda, and sweet tea.     Drink at least 1 glass of water with each meal and aim for at least 8 glasses per day   Exercise at least 150 minutes every week.

## 2019-12-03 NOTE — Patient Instructions (Addendum)
Pre visit review using our clinic review tool, if applicable. No additional management support is needed unless otherwise documented below in the visit note.  Skip dosage today and tomorrow and then continue to take 1 tablet daily except nothing on Wednesdays.  Re-check in 3 weeks.  Patient is argumentative this morning.

## 2019-12-04 ENCOUNTER — Other Ambulatory Visit: Payer: Self-pay

## 2019-12-04 ENCOUNTER — Ambulatory Visit (INDEPENDENT_AMBULATORY_CARE_PROVIDER_SITE_OTHER): Payer: Medicare Other | Admitting: Cardiovascular Disease

## 2019-12-04 VITALS — BP 118/64 | HR 96 | Ht 72.0 in | Wt 229.4 lb

## 2019-12-04 DIAGNOSIS — R609 Edema, unspecified: Secondary | ICD-10-CM | POA: Diagnosis not present

## 2019-12-04 DIAGNOSIS — Z79899 Other long term (current) drug therapy: Secondary | ICD-10-CM

## 2019-12-04 DIAGNOSIS — I10 Essential (primary) hypertension: Secondary | ICD-10-CM

## 2019-12-04 DIAGNOSIS — Z7901 Long term (current) use of anticoagulants: Secondary | ICD-10-CM

## 2019-12-04 DIAGNOSIS — Z5181 Encounter for therapeutic drug level monitoring: Secondary | ICD-10-CM

## 2019-12-04 DIAGNOSIS — E669 Obesity, unspecified: Secondary | ICD-10-CM

## 2019-12-04 DIAGNOSIS — E782 Mixed hyperlipidemia: Secondary | ICD-10-CM | POA: Diagnosis not present

## 2019-12-04 DIAGNOSIS — I4821 Permanent atrial fibrillation: Secondary | ICD-10-CM

## 2019-12-04 DIAGNOSIS — I482 Chronic atrial fibrillation, unspecified: Secondary | ICD-10-CM | POA: Diagnosis not present

## 2019-12-04 LAB — COMPREHENSIVE METABOLIC PANEL
ALT: 15 IU/L (ref 0–44)
AST: 18 IU/L (ref 0–40)
Albumin/Globulin Ratio: 1.7 (ref 1.2–2.2)
Albumin: 4.1 g/dL (ref 3.6–4.6)
Alkaline Phosphatase: 108 IU/L (ref 39–117)
BUN/Creatinine Ratio: 16 (ref 10–24)
BUN: 16 mg/dL (ref 8–27)
Bilirubin Total: 0.5 mg/dL (ref 0.0–1.2)
CO2: 22 mmol/L (ref 20–29)
Calcium: 9.6 mg/dL (ref 8.6–10.2)
Chloride: 103 mmol/L (ref 96–106)
Creatinine, Ser: 1.01 mg/dL (ref 0.76–1.27)
GFR calc Af Amer: 76 mL/min/{1.73_m2} (ref >59)
GFR calc non Af Amer: 66 mL/min/{1.73_m2} (ref >59)
Globulin, Total: 2.4 g/dL (ref 1.5–4.5)
Glucose: 116 mg/dL — ABNORMAL HIGH (ref 65–99)
Potassium: 4.8 mmol/L (ref 3.5–5.2)
Sodium: 140 mmol/L (ref 134–144)
Total Protein: 6.5 g/dL (ref 6.0–8.5)

## 2019-12-04 LAB — CBC
Hematocrit: 48.3 % (ref 37.5–51.0)
Hemoglobin: 16.9 g/dL (ref 13.0–17.7)
MCH: 32.9 pg (ref 26.6–33.0)
MCHC: 35 g/dL (ref 31.5–35.7)
MCV: 94 fL (ref 79–97)
Platelets: 229 10*3/uL (ref 150–450)
RBC: 5.14 x10E6/uL (ref 4.14–5.80)
RDW: 11.9 % (ref 11.6–15.4)
WBC: 8.3 10*3/uL (ref 3.4–10.8)

## 2019-12-04 LAB — MAGNESIUM: Magnesium: 2 mg/dL (ref 1.6–2.3)

## 2019-12-04 LAB — TSH: TSH: 2.75 u[IU]/mL (ref 0.450–4.500)

## 2019-12-04 MED ORDER — HYDROCHLOROTHIAZIDE 12.5 MG PO CAPS: 12.5000 mg | ORAL_CAPSULE | ORAL | 3 refills | Status: DC | PRN

## 2019-12-04 MED ORDER — ASPIRIN EC 81 MG PO TBEC: 81.0000 mg | DELAYED_RELEASE_TABLET | Freq: Every day | ORAL | 3 refills | Status: DC

## 2019-12-04 NOTE — Patient Instructions (Signed)
Medication Instructions:  BEGIN TAKING BABY ASPIRIN EC 81MG  DAILY (1 TABLET DAILY) BEGIN TAKING HYDROCHLOROTHIAZIDE 12.5MG  AS NEEDED  *If you need a refill on your cardiac medications before your next appointment, please call your pharmacy*   Lab Work: TODAY: MAG CMET CBC TSH If you have labs (blood work) drawn today and your tests are completely normal, you will receive your results only by: Marland Kitchen MyChart Message (if you have MyChart) OR . A paper copy in the mail If you have any lab test that is abnormal or we need to change your treatment, we will call you to review the results.  Follow-Up: At [facility], you and your health needs are our priority.  As part of our continuing mission to provide you with exceptional heart care, we have created designated Provider Care Teams.  These Care Teams include your primary Cardiologist (physician) and Advanced Practice Providers (APPs -  Physician Assistants and Nurse Practitioners) who all work together to provide you with the care you need, when you need it.  We recommend signing up for the patient portal called "MyChart".  Sign up information is provided on this After Visit Summary.  MyChart is used to connect with patients for Virtual Visits (Telemedicine).  Patients are able to view lab/test results, encounter notes, upcoming appointments, etc.  Non-urgent messages can be sent to your provider as well.   To learn more about what you can do with MyChart, go to [email or web address].    Your next appointment:   12 month(s)  The format for your next appointment:   In Person  Provider:   Shelva Majestic, MD

## 2019-12-04 NOTE — Progress Notes (Signed)
Primary MD: Andree Coss  PATIENT PROFILE: Glen [name] is a 86 y.o. male who is a former patient of Dr. Mare Ferrari who established care with me in September 2017.  He presents for 15 month follow-up cardiology evaluation.  HPI:  Glen [name] has a history of permanent AF, hypertension and hyperlipidemia. His AF has always been treated with rate control and he neveer had an attempt of cardioversion. He is on coumadin anticoagulation with a cha2ds2vasc score of 3. In the past he was treated with diltiazem and atenolol. He had gingival disease and was taken off diltiazem. Most recently he has been on metoprolol 50 mg bid for rate control but had reduced this to 25 mg bid since he felt that it had contributed to edema  He denies any awareness of sleep apnea. He does not exercise due to knee issues and gets injections into his kness intermittently. He last saw Dr. Mare Ferrari in 11/2015 and complained that his feet :were puffy on the bottom."   When I saw him, I did not feel that the metoprolol was contributing to his feet discomfort.  He had been switched to act to atenolol and most recently is now on metoprolol 50 mg in the morning and 25 mg, which she has been taking the early afternoon per Dr. Burnice Logan.  He continues to be on warfarin anticoagulation.  He continues to experience "puffiness "of his ankles.  At times he admits to eating food with increase sodium.  He denied chest pain, PND, orthopnea.  He denied bleeding.   He was having periods of diarrhea and constipation.  In the past, he was taken off Cardizem due to gingival hypertrophy by his dentist. He wanted to retry Cardizem for improved rate control along with metoprolol but ultimately stopped taking this.  Since I saw him he has been seen on 2 occasions by Dr. Harrell Gave with increased heart rate.  His beta-blocker regimen has been titrated to 50 mg twice a day and recently to 75 mg twice a day.  He has continued to be on warfarin  for anticoagulation.  He is unaware if his heart rhythm is fast. TSH was 4.8.    When I last saw him in September 2019 his ventricular rate was increased and in the 120s.  At that time I further  titrated metoprolol to 100 mg twice a day.  I saw him in follow-up on September 03, 2018.  On his increased regimen he felt his heart rate had improved.  He denied any recent episodes of chest pain or shortness of breath.  At times there is some mild leg swelling.  He continued to be on warfarin for anticoagulation.  I have not seen him since his last evaluation on September 03, 2018.  Apparently recently he had undergone a warfarin check and INR was 4.2.   at his primary MDs office The patient insists today that he was told his atrial fibrillation has resolved.  He emphatically stated he did not want to be on warfarin tomorrow.  He stated that he has not had a heart attack and has not had a stroke and he does not need warfarin.  He presents for evaluation.  Past Medical History:  Diagnosis Date  . Atrial fibrillation ([place])   . Diverticulitis   . Drug therapy    Anticoagulation Therapy  . Family history of diabetes insipidus   . FHx: colon cancer   . History of colonic polyps   .  Hx of echocardiogram    a. Echo 5/11:  EF 55-60%, mild dilated ascending aorta, mod LAE  . Hyperlipidemia   . Hypertension   . Neoplasm of prostate    special screening malignant   . Obesity   . Palpitation     Past Surgical History:  Procedure Laterality Date  . 2-D Echocardiogram  November 2009, 2011  . Anal Fistula Repair    . CARDIAC VALVE SURGERY  1996   status post balloon valvuloplasty at [facility]  . CATARACT EXTRACTION  2004  . COLONOSCOPY  December 2005  . ETT  1991   Negative  . KNEE ARTHROSCOPY     Right  . TONSILLECTOMY      Allergies  Allergen Reactions  . Codeine Other (See Comments)    constpation    Current Outpatient Medications  Medication Sig Dispense Refill  . acetaminophen (TYLENOL) 500  MG tablet Take 1,000 mg by mouth every 6 (six) hours as needed for pain.     Marland Kitchen aspirin EC 81 MG tablet Take 1 tablet (81 mg total) by mouth daily. 90 tablet 3  . hydrochlorothiazide (MICROZIDE) 12.5 MG capsule Take 1 capsule (12.5 mg total) by mouth as needed. 90 capsule 3  . metoprolol tartrate (LOPRESSOR) 100 MG tablet Take 1 tablet by mouth twice daily 180 tablet 0   No current facility-administered medications for this visit.    Social History   Socioeconomic History  . Marital status: Married    Spouse name: Not on file  . Number of children: Not on file  . Years of education: Not on file  . Highest education level: Not on file  Occupational History  . Occupation: Retired    Fish farm manager: RETIRED  Tobacco Use  . Smoking status: Never Smoker  . Smokeless tobacco: Never Used  Substance and Sexual Activity  . Alcohol use: No  . Drug use: Not on file  . Sexual activity: Not on file  Other Topics Concern  . Not on file  Social History Narrative   Regular exercise: Yes: [employer] 4 times/week   Social Determinants of Health   Financial Resource Strain:   . Difficulty of Paying Living Expenses: Not on file  Food Insecurity:   . Worried About Charity fundraiser in the Last Year: Not on file  . Ran Out of Food in the Last Year: Not on file  Transportation Needs:   . Lack of Transportation (Medical): Not on file  . Lack of Transportation (Non-Medical): Not on file  Physical Activity:   . Days of Exercise per Week: Not on file  . Minutes of Exercise per Session: Not on file  Stress:   . Feeling of Stress : Not on file  Social Connections:   . Frequency of Communication with Friends and Family: Not on file  . Frequency of Social Gatherings with Friends and Family: Not on file  . Attends Religious Services: Not on file  . Active Member of Clubs or Organizations: Not on file  . Attends Archivist Meetings: Not on file  . Marital Status: Not on file  Intimate Partner  Violence:   . Fear of Current or Ex-Partner: Not on file  . Emotionally Abused: Not on file  . Physically Abused: Not on file  . Sexually Abused: Not on file    Family History  Problem Relation Age of Onset  . Heart attack Father   . Colon cancer Other   . Diabetes Other   .  Prostate cancer Brother   . Heart disease Other   . Coronary artery disease Other        Two siblings, status post stenting to siblings with heart disease. One. Status post pacemaker insertion.  . Stroke Brother     ROS General: Negative; No fevers, chills, or night sweats HEENT: Negative; No changes in vision or hearing, sinus congestion, difficulty swallowing Pulmonary: Negative; No cough, wheezing, shortness of breath, hemoptysis Cardiovascular:  See HPI; No chest pain, presyncope, syncope, palpitations, edema GI:.  Positive for increased gas, diarrhea as well as constipation GU: Negative; No dysuria, hematuria, or difficulty voiding Musculoskeletal: Negative; no myalgias, joint pain, or weakness Hematologic/Oncologic: Negative; no easy bruising, bleeding Endocrine: Negative; no heat/cold intolerance; no diabetes Neuro: Negative; no changes in balance, headaches Skin: Negative; No rashes or skin lesions Psychiatric: Negative; No behavioral problems, depression Sleep: Negative; No daytime sleepiness, hypersomnolence, bruxism, restless legs, hypnogagnic hallucinations Other comprehensive 14 point system review is negative   Physical Exam BP 118/64   Pulse 96   Ht 6' (1.829 m)   Wt 229 lb 6.4 oz (104.1 kg)   SpO2 96%   BMI 31.11 kg/m    Repeat blood pressure by me was 124/68  Wt Readings from Last 3 Encounters:  12/04/19 229 lb 6.4 oz (104.1 kg)  12/03/19 229 lb 4 oz (104 kg)  06/25/19 232 lb 2.3 oz (105.3 kg)   General: Alert, oriented, no distress.  Skin: normal turgor, no rashes, warm and dry HEENT: Normocephalic, atraumatic. Pupils equal round and reactive to light; sclera anicteric;  extraocular muscles intact;  Nose without nasal septal hypertrophy Mouth/Parynx benign; Mallinpatti scale 3 Neck: No JVD, no carotid bruits; normal carotid upstroke Lungs: clear to ausculatation and percussion; no wheezing or rales Chest wall: without tenderness to palpitation Heart: PMI not displaced, irregularly irregular consistent with atrial fibrillation with a ventricular rate in the 90s, s1 s2 normal, 1/6 systolic murmur, no diastolic murmur, no rubs, gallops, thrills, or heaves Abdomen: soft, nontender; no hepatosplenomehaly, BS+; abdominal aorta nontender and not dilated by palpation. Back: no CVA tenderness Pulses 2+ Musculoskeletal: full range of motion, normal strength, no joint deformities Extremities: 1+ leg edema right lower extremity; no clubbing cyanosis or edema, Homan's sign negative  Neurologic: grossly nonfocal; Cranial nerves grossly wnl Psychologic: Normal mood and affect   ECG (independently read by me): Atrial fibrilllation at 96; NSSTT changes; QTc 416 msec  September 03, 2018 ECG (independently read by me): Atrial  fibrillation at 88 bpm.  Nonspecific T wave abnormality.  June 25, 2018 ECG (independently read by me): Atrial fibrillation with ventricular rate of 123 bpm.  Nonspecific T wave abnormality.  December 20, 2017 ECG (independently read by me): Atrial fibrillation with ventricular rate at 97 bpm.  Small Q wave in 3.  Low voltage.  September 2018ECG (independently read by me): Atrial fibrillation with ventricular rate at approximately 100 bpm.  Q wave in aVF; low voltage.  Early transition.  No significant ST changes  March 2018 ECG (independently read by me): Atrial fibrillation with ventricular rate at 104 bpm.  QTc interval 433 ms.  September 2017 ECG (independently read by me): AF at 106 without significant STT changes.  LABS:  BMP Latest Ref Rng & Units 12/04/2019 09/11/2018 04/19/2018  Glucose 65 - 99 mg/dL 116(H) 94 94  BUN 8 - 27 mg/dL 16 17 21    Creatinine 0.76 - 1.27 mg/dL 1.01 1.02 0.92  BUN/Creat Ratio 10 - 24 16 17  23  Sodium 134 - 144 mmol/L 140 143 140  Potassium 3.5 - 5.2 mmol/L 4.8 5.2 4.7  Chloride 96 - 106 mmol/L 103 105 101  CO2 20 - 29 mmol/L 22 24 20   Calcium 8.6 - 10.2 mg/dL 9.6 9.4 9.6     Hepatic Function Latest Ref Rng & Units 12/04/2019 09/11/2018 07/31/2017  Total Protein 6.0 - 8.5 g/dL 6.5 6.6 6.5  Albumin 3.6 - 4.6 g/dL 4.1 4.4 4.1  AST 0 - 40 IU/L 18 21 23   ALT 0 - 44 IU/L 15 18 22   Alk Phosphatase 39 - 117 IU/L 108 91 83  Total Bilirubin 0.0 - 1.2 mg/dL 0.5 0.5 0.6  Bilirubin, Direct 0.0 - 0.3 mg/dL - - -    CBC Latest Ref Rng & Units 12/04/2019 09/11/2018 07/31/2017  WBC 3.4 - 10.8 x10E3/uL 8.3 8.1 7.4  Hemoglobin 13.0 - 17.7 g/dL 16.9 17.3 17.0  Hematocrit 37.5 - 51.0 % 48.3 50.7 50.6  Platelets 150 - 450 x10E3/uL 229 213 210.0   Lab Results  Component Value Date   MCV 94 12/04/2019   MCV 94 09/11/2018   MCV 96.8 07/31/2017   Lab Results  Component Value Date   TSH 2.750 12/04/2019   Lab Results  Component Value Date   HGBA1C 5.9 07/31/2017    MG 2.1  BNP    Component Value Date/Time   BNP 27.4 11/18/2015 1024    ProBNP No results found for: PROBNP   Lipid Panel     Component Value Date/Time   CHOL 197 09/11/2018 1045   TRIG 162 (H) 09/11/2018 1045   TRIG 150 (H) 09/06/2006 1031   HDL 39 (L) 09/11/2018 1045   CHOLHDL 5.1 (H) 09/11/2018 1045   CHOLHDL 5.0 06/16/2016 0946   VLDL 43 (H) 06/16/2016 0946   LDLCALC 126 (H) 09/11/2018 1045   LDLDIRECT 126.0 04/19/2015 1000    RADIOLOGY: No results found.   IMPRESSION:  1. Permanent atrial fibrillation ([place])   2. Alteration in anticoagulation   3. Peripheral edema   4. Essential hypertension   5. Mild obesity   6. Medication management     ASSESSMENT AND PLAN: Mr Landgrebe is an 86 year old [place] male who has a history of hypertension, hyperlidemia and permanent AF on coumadin anticoagulation. An echo in  12/2014 was unchanged from a prior echo of 2011, and ejection fraction remained normal at 55-60%.  His left atrium was moderately dilated which was probably contributed by his atrial fibrillation. He continues to be on warfarin for anticoagulation with INH goal of 2.5. When I saw the patient in March 2018 , I had recommended titration of metoprolol for improved rate control and subsequently increase this to 50 mg twice a day.  He developed some dependent rubor with mild edema  and I suspect this may be contributed by mild venous insufficiency.  He was given a prescription for HCTZ, which he did not take and therefore I recommended support stockings.  Over the last several office visits, his metoprolol was titrated up to its present dose of 100 mg twice a day.    Remotely he was on Cardizem, there was some concern for gingival hypertrophy.  I have not seen him in 15 months.  His ECG confirms that he continues to be in permanent atrial fibrillation with ventricular rate in the 90s.  The patient is adamant that he was told that his atrial fibrillation had resolved.  He insisted that he does not need warfarin.  He  was screaming in the office today stating that he has not had a heart attack or stroke and does not need warfarin.  I discussed with him the increased risk of potential stroke without anticoagulation with his permanent atrial fibrillation.  He continues to insist that he is not in atrial fibrillation.  At this point, I made him fully aware of the risks of stopping warfarin and per his request this will be discontinued.  In its place I have recommended institution enteric-coated aspirin 81 mg.  He has significant lower extremity edema in the right lower extremity and I will again give him a prescription for HCTZ 12.5 mg to take as needed.  He will return to Dr. Dimas Chyle, MD for his primary care.  I will see him in 1 year for reevaluation.  Troy Sine, MD, [facility] 12/06/2019 10:46 AM

## 2019-12-06 ENCOUNTER — Encounter: Payer: Self-pay | Admitting: Cardiovascular Disease

## 2019-12-24 ENCOUNTER — Ambulatory Visit: Payer: Medicare Other

## 2020-01-05 ENCOUNTER — Other Ambulatory Visit: Payer: Self-pay | Admitting: Cardiovascular Disease

## 2020-02-10 ENCOUNTER — Ambulatory Visit: Payer: Self-pay | Admitting: General Practice

## 2020-02-10 DIAGNOSIS — Z7901 Long term (current) use of anticoagulants: Secondary | ICD-10-CM

## 2020-03-15 ENCOUNTER — Other Ambulatory Visit: Payer: Self-pay | Admitting: Cardiovascular Disease

## 2020-06-08 ENCOUNTER — Ambulatory Visit: Payer: Medicare Other | Admitting: Family Medicine

## 2020-06-21 ENCOUNTER — Ambulatory Visit (INDEPENDENT_AMBULATORY_CARE_PROVIDER_SITE_OTHER): Payer: Medicare Other | Admitting: Family Medicine

## 2020-06-21 ENCOUNTER — Encounter: Payer: Self-pay | Admitting: Family Medicine

## 2020-06-21 ENCOUNTER — Other Ambulatory Visit: Payer: Self-pay

## 2020-06-21 VITALS — BP 123/87 | HR 105 | Temp 97.8°F | Ht 72.0 in | Wt 227.0 lb

## 2020-06-21 DIAGNOSIS — E785 Hyperlipidemia, unspecified: Secondary | ICD-10-CM

## 2020-06-21 DIAGNOSIS — I10 Essential (primary) hypertension: Secondary | ICD-10-CM | POA: Diagnosis not present

## 2020-06-21 DIAGNOSIS — R05 Cough: Secondary | ICD-10-CM

## 2020-06-21 DIAGNOSIS — Z23 Encounter for immunization: Secondary | ICD-10-CM

## 2020-06-21 DIAGNOSIS — I4891 Unspecified atrial fibrillation: Secondary | ICD-10-CM

## 2020-06-21 DIAGNOSIS — M1990 Unspecified osteoarthritis, unspecified site: Secondary | ICD-10-CM | POA: Diagnosis not present

## 2020-06-21 DIAGNOSIS — M1711 Unilateral primary osteoarthritis, right knee: Secondary | ICD-10-CM

## 2020-06-21 DIAGNOSIS — R059 Cough, unspecified: Secondary | ICD-10-CM

## 2020-06-21 MED ORDER — METHYLPREDNISOLONE ACETATE 80 MG/ML IJ SUSP
80.0000 mg | Freq: Once | INTRAMUSCULAR | Status: AC
  Administered 2020-06-21: 80 mg via INTRA_ARTICULAR

## 2020-06-21 MED ORDER — METHYLPREDNISOLONE ACETATE 80 MG/ML IJ SUSP: 80.0000 mg | Freq: Once | INTRAMUSCULAR | Status: DC

## 2020-06-21 NOTE — Assessment & Plan Note (Signed)
Injection performed today - see below procedure note. He tolerated well. He can continue using ice and OTC meds as needed.

## 2020-06-21 NOTE — Assessment & Plan Note (Signed)
At goal. Continue HCTZ 12.5mg  daily and lopressor 100mg  bid. Check CBC, CMET, and TSH.

## 2020-06-21 NOTE — Progress Notes (Signed)
   Glen [name] is a 86 y.o. male who presents today for an office visit.  Assessment/Plan:  Chronic Problems Addressed Today: Dyslipidemia Diet controlled. Does want to check lipids today.   Essential hypertension At goal. Continue HCTZ 12.5mg  daily and lopressor 100mg  bid. Check CBC, CMET, and TSH.   Atrial fibrillation No longer on warfarin. Continue aspirin 81mg  daily and rate control with lopressor 100mg  bid.   Osteoarthritis Injection performed today - see below procedure note. He tolerated well. He can continue using ice and OTC meds as needed.   Preventative Healthcare Flu vaccine given today.     Subjective:  HPI:  Patient here for 6 month follow up. HE has had a flare of osteoarthritis but is otherwise doing well today. He recently stopped taking warfarin.         Objective:  Physical Exam: BP 123/87   Pulse (!) 105   Temp 97.8 F (36.6 C) (Temporal)   Ht 6' (1.829 m)   Wt 227 lb (103 kg)   SpO2 97%   BMI 30.79 kg/m   Gen: No acute distress, resting comfortably CV: irregular with no murmurs appreciated Pulm: Normal work of breathing, clear to auscultation bilaterally with no crackles, wheezes, or rhonchi Neuro: Grossly normal, moves all extremities Psych: Normal affect and thought content  Knee Injection Procedure Note  Indication: Symptom relief of right Knee Pain.  Procedure Details  Verbal consent was obtained for the procedure. The joint was prepped with Betadine. Topical ethyl chloride was applied for anesthesia. A 22 gauge needle was inserted into the medial aspect of the joint.  3 ml 1% lidocaine and 1 ml of 40mg /cc Depo-Medrol was then injected into the joint. The needle was removed and the area cleansed and dressed.  Complications:  None; patient tolerated the procedure well.       [name]. [name], MD 06/21/2020 8:32 AM

## 2020-06-21 NOTE — Assessment & Plan Note (Signed)
No longer on warfarin. Continue aspirin 81mg  daily and rate control with lopressor 100mg  bid.

## 2020-06-21 NOTE — Patient Instructions (Addendum)
It was very nice to see you today!  We injected your knee. You can try using glucosamine-chondroitin or turmeric for your knees.  We will give you your flu vaccine today.  We will check blood work.  I will see you back in 6 months.  Come back to see me sooner if needed.   Take care, Dr [name]  Please try these tips to maintain a healthy lifestyle:   Eat at least 3 REAL meals and 1-2 snacks per day.  Aim for no more than 5 hours between eating.  If you eat breakfast, please do so within one hour of getting up.    Each meal should contain half fruits/vegetables, one quarter protein, and one quarter carbs (no bigger than a computer mouse)   Cut down on sweet beverages. This includes juice, soda, and sweet tea.     Drink at least 1 glass of water with each meal and aim for at least 8 glasses per day   Exercise at least 150 minutes every week.

## 2020-06-21 NOTE — Assessment & Plan Note (Signed)
Diet controlled. Does want to check lipids today.

## 2020-06-22 LAB — COMPREHENSIVE METABOLIC PANEL
AG Ratio: 1.8 (calc) (ref 1.0–2.5)
ALT: 10 U/L (ref 9–46)
AST: 14 U/L (ref 10–35)
Albumin: 4 g/dL (ref 3.6–5.1)
Alkaline phosphatase (APISO): 87 U/L (ref 35–144)
BUN: 18 mg/dL (ref 7–25)
CO2: 23 mmol/L (ref 20–32)
Calcium: 8.9 mg/dL (ref 8.6–10.3)
Chloride: 106 mmol/L (ref 98–110)
Creat: 0.91 mg/dL (ref 0.70–1.11)
Globulin: 2.2 g/dL (calc) (ref 1.9–3.7)
Glucose, Bld: 158 mg/dL — ABNORMAL HIGH (ref 65–99)
Potassium: 4.2 mmol/L (ref 3.5–5.3)
Sodium: 138 mmol/L (ref 135–146)
Total Bilirubin: 0.7 mg/dL (ref 0.2–1.2)
Total Protein: 6.2 g/dL (ref 6.1–8.1)

## 2020-06-22 LAB — CBC
HCT: 47.4 % (ref 38.5–50.0)
Hemoglobin: 16.1 g/dL (ref 13.2–17.1)
MCH: 32.1 pg (ref 27.0–33.0)
MCHC: 34 g/dL (ref 32.0–36.0)
MCV: 94.4 fL (ref 80.0–100.0)
MPV: 10.2 fL (ref 7.5–12.5)
Platelets: 222 10*3/uL (ref 140–400)
RBC: 5.02 10*6/uL (ref 4.20–5.80)
RDW: 12.2 % (ref 11.0–15.0)
WBC: 6.5 10*3/uL (ref 3.8–10.8)

## 2020-06-22 LAB — SARS COV-2 SEROLOGY(COVID-19)AB(IGG,IGM),IMMUNOASSAY
SARS CoV-2 AB IgG: NEGATIVE
SARS CoV-2 IgM: NEGATIVE

## 2020-06-22 LAB — TSH: TSH: 3.02 mIU/L (ref 0.40–4.50)

## 2020-06-22 NOTE — Progress Notes (Signed)
Please inform patient of the following:  COVID antibody is negative - recommend that he get booster now. Blood sugar elevated but everything else is NORMAL. We should check again in 6-12 months.  Glen [name]. [name], MD 06/22/2020 2:55 PM

## 2020-06-28 ENCOUNTER — Other Ambulatory Visit: Payer: Self-pay | Admitting: Cardiovascular Disease

## 2020-07-01 DIAGNOSIS — Z23 Encounter for immunization: Secondary | ICD-10-CM | POA: Diagnosis not present

## 2020-07-12 ENCOUNTER — Emergency Department (HOSPITAL_COMMUNITY): Payer: Medicare Other

## 2020-07-12 ENCOUNTER — Encounter (HOSPITAL_COMMUNITY): Payer: Self-pay | Admitting: Emergency Medicine

## 2020-07-12 ENCOUNTER — Emergency Department (HOSPITAL_COMMUNITY)
Admission: EM | Admit: 2020-07-12 | Discharge: 2020-07-13 | Disposition: A | Discharge status: Home / Self Care | Payer: Medicare Other | Source: Home / Self Care | Attending: Emergency Medicine | Admitting: Emergency Medicine

## 2020-07-12 DIAGNOSIS — R0902 Hypoxemia: Secondary | ICD-10-CM | POA: Diagnosis not present

## 2020-07-12 DIAGNOSIS — G319 Degenerative disease of nervous system, unspecified: Secondary | ICD-10-CM | POA: Diagnosis not present

## 2020-07-12 DIAGNOSIS — Z7901 Long term (current) use of anticoagulants: Secondary | ICD-10-CM | POA: Insufficient documentation

## 2020-07-12 DIAGNOSIS — R42 Dizziness and giddiness: Secondary | ICD-10-CM | POA: Diagnosis not present

## 2020-07-12 DIAGNOSIS — I10 Essential (primary) hypertension: Secondary | ICD-10-CM | POA: Insufficient documentation

## 2020-07-12 DIAGNOSIS — Z7982 Long term (current) use of aspirin: Secondary | ICD-10-CM | POA: Insufficient documentation

## 2020-07-12 DIAGNOSIS — I4891 Unspecified atrial fibrillation: Secondary | ICD-10-CM | POA: Diagnosis not present

## 2020-07-12 DIAGNOSIS — S0990XA Unspecified injury of head, initial encounter: Secondary | ICD-10-CM | POA: Diagnosis not present

## 2020-07-12 DIAGNOSIS — Z66 Do not resuscitate: Secondary | ICD-10-CM | POA: Diagnosis not present

## 2020-07-12 DIAGNOSIS — Z20822 Contact with and (suspected) exposure to covid-19: Secondary | ICD-10-CM | POA: Diagnosis not present

## 2020-07-12 DIAGNOSIS — I4819 Other persistent atrial fibrillation: Secondary | ICD-10-CM

## 2020-07-12 DIAGNOSIS — K922 Gastrointestinal hemorrhage, unspecified: Secondary | ICD-10-CM | POA: Diagnosis not present

## 2020-07-12 DIAGNOSIS — B9681 Helicobacter pylori [H. pylori] as the cause of diseases classified elsewhere: Secondary | ICD-10-CM | POA: Diagnosis not present

## 2020-07-12 DIAGNOSIS — M19012 Primary osteoarthritis, left shoulder: Secondary | ICD-10-CM | POA: Diagnosis not present

## 2020-07-12 DIAGNOSIS — R600 Localized edema: Secondary | ICD-10-CM | POA: Diagnosis not present

## 2020-07-12 DIAGNOSIS — M19011 Primary osteoarthritis, right shoulder: Secondary | ICD-10-CM | POA: Diagnosis not present

## 2020-07-12 DIAGNOSIS — K264 Chronic or unspecified duodenal ulcer with hemorrhage: Secondary | ICD-10-CM | POA: Diagnosis not present

## 2020-07-12 DIAGNOSIS — I6782 Cerebral ischemia: Secondary | ICD-10-CM | POA: Diagnosis not present

## 2020-07-12 DIAGNOSIS — I700 Atherosclerosis of aorta: Secondary | ICD-10-CM | POA: Diagnosis not present

## 2020-07-12 DIAGNOSIS — Z79899 Other long term (current) drug therapy: Secondary | ICD-10-CM | POA: Insufficient documentation

## 2020-07-12 DIAGNOSIS — R0602 Shortness of breath: Secondary | ICD-10-CM | POA: Diagnosis not present

## 2020-07-12 DIAGNOSIS — M7731 Calcaneal spur, right foot: Secondary | ICD-10-CM | POA: Diagnosis not present

## 2020-07-12 DIAGNOSIS — R1110 Vomiting, unspecified: Secondary | ICD-10-CM | POA: Diagnosis not present

## 2020-07-12 DIAGNOSIS — I482 Chronic atrial fibrillation, unspecified: Secondary | ICD-10-CM | POA: Diagnosis not present

## 2020-07-12 DIAGNOSIS — I499 Cardiac arrhythmia, unspecified: Secondary | ICD-10-CM | POA: Diagnosis not present

## 2020-07-12 DIAGNOSIS — R002 Palpitations: Secondary | ICD-10-CM | POA: Diagnosis not present

## 2020-07-12 DIAGNOSIS — D62 Acute posthemorrhagic anemia: Secondary | ICD-10-CM | POA: Diagnosis not present

## 2020-07-12 LAB — BASIC METABOLIC PANEL
Anion gap: 10 (ref 5–15)
BUN: 51 mg/dL — ABNORMAL HIGH (ref 8–23)
CO2: 17 mmol/L — ABNORMAL LOW (ref 22–32)
Calcium: 9 mg/dL (ref 8.9–10.3)
Chloride: 111 mmol/L (ref 98–111)
Creatinine, Ser: 1.05 mg/dL (ref 0.61–1.24)
GFR, Estimated: >60 mL/min (ref >60)
Glucose, Bld: 170 mg/dL — ABNORMAL HIGH (ref 70–99)
Potassium: 3.9 mmol/L (ref 3.5–5.1)
Sodium: 138 mmol/L (ref 135–145)

## 2020-07-12 LAB — CBC
HCT: 42.9 % (ref 39.0–52.0)
Hemoglobin: 14.2 g/dL (ref 13.0–17.0)
MCH: 32.3 pg (ref 26.0–34.0)
MCHC: 33.1 g/dL (ref 30.0–36.0)
MCV: 97.5 fL (ref 80.0–100.0)
Platelets: 214 10*3/uL (ref 150–400)
RBC: 4.4 MIL/uL (ref 4.22–5.81)
RDW: 12.9 % (ref 11.5–15.5)
WBC: 15.5 10*3/uL — ABNORMAL HIGH (ref 4.0–10.5)
nRBC: 0 % (ref 0.0–0.2)

## 2020-07-12 LAB — TROPONIN I (HIGH SENSITIVITY): Troponin I (High Sensitivity): 4 ng/L (ref <18)

## 2020-07-12 NOTE — ED Notes (Signed)
Pt very upset he has not been taken to a room in a timely manner, every attempt made to ensure patient we have begun care and we are monitoring him within the triage area. Pt states he would like to go home at this time, attempts made to get pt to remain to see an MD. Family is with patient.

## 2020-07-12 NOTE — ED Triage Notes (Signed)
Pt transported from home by EMS after becoming dizzy then had fall at home, c/o R ankle pain, swelling noted. Pt found to have HR between 115-135, 10 of diltiazem given IVP, HR now 90's, with BP 90 palp. No repeat dose given.

## 2020-07-13 ENCOUNTER — Encounter (HOSPITAL_COMMUNITY): Payer: Self-pay | Admitting: Internal Medicine

## 2020-07-13 ENCOUNTER — Other Ambulatory Visit: Payer: Self-pay

## 2020-07-13 ENCOUNTER — Inpatient Hospital Stay (HOSPITAL_COMMUNITY)
Admission: EM | Admit: 2020-07-13 | Discharge: 2020-07-17 | DRG: 378 | Disposition: A | Discharge status: Home Health | Payer: Medicare Other | Attending: Student | Admitting: Student

## 2020-07-13 ENCOUNTER — Emergency Department (HOSPITAL_COMMUNITY): Payer: Medicare Other

## 2020-07-13 DIAGNOSIS — R42 Dizziness and giddiness: Secondary | ICD-10-CM | POA: Diagnosis not present

## 2020-07-13 DIAGNOSIS — K2270 Barrett's esophagus without dysplasia: Secondary | ICD-10-CM | POA: Diagnosis present

## 2020-07-13 DIAGNOSIS — E669 Obesity, unspecified: Secondary | ICD-10-CM | POA: Diagnosis present

## 2020-07-13 DIAGNOSIS — D72829 Elevated white blood cell count, unspecified: Secondary | ICD-10-CM | POA: Diagnosis present

## 2020-07-13 DIAGNOSIS — K921 Melena: Secondary | ICD-10-CM | POA: Diagnosis not present

## 2020-07-13 DIAGNOSIS — I4819 Other persistent atrial fibrillation: Secondary | ICD-10-CM | POA: Diagnosis present

## 2020-07-13 DIAGNOSIS — R7302 Impaired glucose tolerance (oral): Secondary | ICD-10-CM | POA: Diagnosis present

## 2020-07-13 DIAGNOSIS — K264 Chronic or unspecified duodenal ulcer with hemorrhage: Secondary | ICD-10-CM

## 2020-07-13 DIAGNOSIS — K2950 Unspecified chronic gastritis without bleeding: Secondary | ICD-10-CM | POA: Diagnosis present

## 2020-07-13 DIAGNOSIS — K922 Gastrointestinal hemorrhage, unspecified: Secondary | ICD-10-CM | POA: Diagnosis not present

## 2020-07-13 DIAGNOSIS — G319 Degenerative disease of nervous system, unspecified: Secondary | ICD-10-CM | POA: Diagnosis not present

## 2020-07-13 DIAGNOSIS — R739 Hyperglycemia, unspecified: Secondary | ICD-10-CM | POA: Diagnosis present

## 2020-07-13 DIAGNOSIS — Z8042 Family history of malignant neoplasm of prostate: Secondary | ICD-10-CM

## 2020-07-13 DIAGNOSIS — Z6830 Body mass index (BMI) 30.0-30.9, adult: Secondary | ICD-10-CM

## 2020-07-13 DIAGNOSIS — K2100 Gastro-esophageal reflux disease with esophagitis, without bleeding: Secondary | ICD-10-CM | POA: Diagnosis present

## 2020-07-13 DIAGNOSIS — I4891 Unspecified atrial fibrillation: Secondary | ICD-10-CM | POA: Diagnosis not present

## 2020-07-13 DIAGNOSIS — Z20822 Contact with and (suspected) exposure to covid-19: Secondary | ICD-10-CM | POA: Diagnosis present

## 2020-07-13 DIAGNOSIS — Z7982 Long term (current) use of aspirin: Secondary | ICD-10-CM

## 2020-07-13 DIAGNOSIS — B9681 Helicobacter pylori [H. pylori] as the cause of diseases classified elsewhere: Secondary | ICD-10-CM | POA: Diagnosis present

## 2020-07-13 DIAGNOSIS — M25471 Effusion, right ankle: Secondary | ICD-10-CM | POA: Diagnosis present

## 2020-07-13 DIAGNOSIS — R1110 Vomiting, unspecified: Secondary | ICD-10-CM | POA: Diagnosis not present

## 2020-07-13 DIAGNOSIS — K2289 Other specified disease of esophagus: Secondary | ICD-10-CM | POA: Diagnosis present

## 2020-07-13 DIAGNOSIS — M25571 Pain in right ankle and joints of right foot: Secondary | ICD-10-CM | POA: Diagnosis present

## 2020-07-13 DIAGNOSIS — S0990XA Unspecified injury of head, initial encounter: Secondary | ICD-10-CM | POA: Diagnosis not present

## 2020-07-13 DIAGNOSIS — W19XXXA Unspecified fall, initial encounter: Secondary | ICD-10-CM | POA: Diagnosis not present

## 2020-07-13 DIAGNOSIS — K920 Hematemesis: Secondary | ICD-10-CM | POA: Diagnosis not present

## 2020-07-13 DIAGNOSIS — Z9181 History of falling: Secondary | ICD-10-CM | POA: Diagnosis not present

## 2020-07-13 DIAGNOSIS — K209 Esophagitis, unspecified without bleeding: Secondary | ICD-10-CM | POA: Diagnosis not present

## 2020-07-13 DIAGNOSIS — W19XXXD Unspecified fall, subsequent encounter: Secondary | ICD-10-CM

## 2020-07-13 DIAGNOSIS — A048 Other specified bacterial intestinal infections: Secondary | ICD-10-CM | POA: Diagnosis not present

## 2020-07-13 DIAGNOSIS — D649 Anemia, unspecified: Secondary | ICD-10-CM | POA: Diagnosis not present

## 2020-07-13 DIAGNOSIS — R1111 Vomiting without nausea: Secondary | ICD-10-CM | POA: Diagnosis not present

## 2020-07-13 DIAGNOSIS — K2990 Gastroduodenitis, unspecified, without bleeding: Secondary | ICD-10-CM

## 2020-07-13 DIAGNOSIS — D62 Acute posthemorrhagic anemia: Secondary | ICD-10-CM | POA: Diagnosis present

## 2020-07-13 DIAGNOSIS — R61 Generalized hyperhidrosis: Secondary | ICD-10-CM | POA: Diagnosis not present

## 2020-07-13 DIAGNOSIS — K2980 Duodenitis without bleeding: Secondary | ICD-10-CM | POA: Diagnosis present

## 2020-07-13 DIAGNOSIS — I482 Chronic atrial fibrillation, unspecified: Secondary | ICD-10-CM | POA: Diagnosis not present

## 2020-07-13 DIAGNOSIS — K2970 Gastritis, unspecified, without bleeding: Secondary | ICD-10-CM

## 2020-07-13 DIAGNOSIS — I6782 Cerebral ischemia: Secondary | ICD-10-CM | POA: Diagnosis not present

## 2020-07-13 DIAGNOSIS — I951 Orthostatic hypotension: Secondary | ICD-10-CM | POA: Diagnosis present

## 2020-07-13 DIAGNOSIS — R531 Weakness: Secondary | ICD-10-CM | POA: Diagnosis not present

## 2020-07-13 DIAGNOSIS — R319 Hematuria, unspecified: Secondary | ICD-10-CM | POA: Diagnosis present

## 2020-07-13 DIAGNOSIS — Q399 Congenital malformation of esophagus, unspecified: Secondary | ICD-10-CM | POA: Diagnosis not present

## 2020-07-13 DIAGNOSIS — B961 Klebsiella pneumoniae [K. pneumoniae] as the cause of diseases classified elsewhere: Secondary | ICD-10-CM | POA: Diagnosis not present

## 2020-07-13 DIAGNOSIS — Z8719 Personal history of other diseases of the digestive system: Secondary | ICD-10-CM

## 2020-07-13 DIAGNOSIS — R0602 Shortness of breath: Secondary | ICD-10-CM | POA: Diagnosis not present

## 2020-07-13 DIAGNOSIS — E6609 Other obesity due to excess calories: Secondary | ICD-10-CM | POA: Diagnosis not present

## 2020-07-13 DIAGNOSIS — Z66 Do not resuscitate: Secondary | ICD-10-CM | POA: Diagnosis present

## 2020-07-13 DIAGNOSIS — Z823 Family history of stroke: Secondary | ICD-10-CM

## 2020-07-13 DIAGNOSIS — D696 Thrombocytopenia, unspecified: Secondary | ICD-10-CM | POA: Diagnosis present

## 2020-07-13 DIAGNOSIS — E785 Hyperlipidemia, unspecified: Secondary | ICD-10-CM | POA: Diagnosis present

## 2020-07-13 DIAGNOSIS — Z79899 Other long term (current) drug therapy: Secondary | ICD-10-CM

## 2020-07-13 DIAGNOSIS — Z8546 Personal history of malignant neoplasm of prostate: Secondary | ICD-10-CM

## 2020-07-13 DIAGNOSIS — Z8249 Family history of ischemic heart disease and other diseases of the circulatory system: Secondary | ICD-10-CM

## 2020-07-13 DIAGNOSIS — I10 Essential (primary) hypertension: Secondary | ICD-10-CM | POA: Diagnosis present

## 2020-07-13 LAB — CBC
HCT: 38.4 % — ABNORMAL LOW (ref 39.0–52.0)
HCT: 34.4 % — ABNORMAL LOW (ref 39.0–52.0)
Hemoglobin: 12.6 g/dL — ABNORMAL LOW (ref 13.0–17.0)
Hemoglobin: 11 g/dL — ABNORMAL LOW (ref 13.0–17.0)
MCH: 32.4 pg (ref 26.0–34.0)
MCH: 31.5 pg (ref 26.0–34.0)
MCHC: 32.8 g/dL (ref 30.0–36.0)
MCHC: 32 g/dL (ref 30.0–36.0)
MCV: 98.7 fL (ref 80.0–100.0)
MCV: 98.6 fL (ref 80.0–100.0)
Platelets: 192 10*3/uL (ref 150–400)
Platelets: 181 10*3/uL (ref 150–400)
RBC: 3.89 MIL/uL — ABNORMAL LOW (ref 4.22–5.81)
RBC: 3.49 MIL/uL — ABNORMAL LOW (ref 4.22–5.81)
RDW: 13.1 % (ref 11.5–15.5)
RDW: 13.2 % (ref 11.5–15.5)
WBC: 17.6 10*3/uL — ABNORMAL HIGH (ref 4.0–10.5)
WBC: 15.7 10*3/uL — ABNORMAL HIGH (ref 4.0–10.5)
nRBC: 0 % (ref 0.0–0.2)
nRBC: 0 % (ref 0.0–0.2)

## 2020-07-13 LAB — COMPREHENSIVE METABOLIC PANEL
ALT: 11 U/L (ref 0–44)
AST: 19 U/L (ref 15–41)
Albumin: 3.1 g/dL — ABNORMAL LOW (ref 3.5–5.0)
Alkaline Phosphatase: 55 U/L (ref 38–126)
Anion gap: 12 (ref 5–15)
BUN: 63 mg/dL — ABNORMAL HIGH (ref 8–23)
CO2: 16 mmol/L — ABNORMAL LOW (ref 22–32)
Calcium: 8.6 mg/dL — ABNORMAL LOW (ref 8.9–10.3)
Chloride: 111 mmol/L (ref 98–111)
Creatinine, Ser: 1.03 mg/dL (ref 0.61–1.24)
GFR, Estimated: >60 mL/min (ref >60)
Glucose, Bld: 164 mg/dL — ABNORMAL HIGH (ref 70–99)
Potassium: 4.9 mmol/L (ref 3.5–5.1)
Sodium: 139 mmol/L (ref 135–145)
Total Bilirubin: 0.8 mg/dL (ref 0.3–1.2)
Total Protein: 5.2 g/dL — ABNORMAL LOW (ref 6.5–8.1)

## 2020-07-13 LAB — RESPIRATORY PANEL BY RT PCR (FLU A&B, COVID)
Influenza A by PCR: NEGATIVE
Influenza B by PCR: NEGATIVE
SARS Coronavirus 2 by RT PCR: NEGATIVE

## 2020-07-13 LAB — I-STAT CHEM 8, ED
BUN: 57 mg/dL — ABNORMAL HIGH (ref 8–23)
Calcium, Ion: 1.14 mmol/L — ABNORMAL LOW (ref 1.15–1.40)
Chloride: 109 mmol/L (ref 98–111)
Creatinine, Ser: 0.8 mg/dL (ref 0.61–1.24)
Glucose, Bld: 144 mg/dL — ABNORMAL HIGH (ref 70–99)
HCT: 34 % — ABNORMAL LOW (ref 39.0–52.0)
Hemoglobin: 11.6 g/dL — ABNORMAL LOW (ref 13.0–17.0)
Potassium: 4 mmol/L (ref 3.5–5.1)
Sodium: 140 mmol/L (ref 135–145)
TCO2: 20 mmol/L — ABNORMAL LOW (ref 22–32)

## 2020-07-13 LAB — TYPE AND SCREEN
ABO/RH(D): O POS
Antibody Screen: NEGATIVE

## 2020-07-13 LAB — URINALYSIS, ROUTINE W REFLEX MICROSCOPIC
Bacteria, UA: NONE SEEN
Bilirubin Urine: NEGATIVE
Glucose, UA: NEGATIVE mg/dL
Ketones, ur: 5 mg/dL — AB
Leukocytes,Ua: NEGATIVE
Nitrite: NEGATIVE
Protein, ur: 100 mg/dL — AB
RBC / HPF: >50 RBC/hpf — ABNORMAL HIGH (ref 0–5)
Specific Gravity, Urine: 1.021 (ref 1.005–1.030)
pH: 5 (ref 5.0–8.0)

## 2020-07-13 LAB — ABO/RH: ABO/RH(D): O POS

## 2020-07-13 LAB — POC OCCULT BLOOD, ED: Fecal Occult Bld: POSITIVE — AB

## 2020-07-13 LAB — LIPASE, BLOOD: Lipase: 27 U/L (ref 11–51)

## 2020-07-13 LAB — TROPONIN I (HIGH SENSITIVITY): Troponin I (High Sensitivity): 5 ng/L (ref <18)

## 2020-07-13 MED ORDER — PANTOPRAZOLE SODIUM 40 MG IV SOLR: 40.0000 mg | Freq: Two times a day (BID) | INTRAVENOUS | Status: DC

## 2020-07-13 MED ORDER — ACETAMINOPHEN 325 MG PO TABS
650.0000 mg | ORAL_TABLET | Freq: Four times a day (QID) | ORAL | Status: DC | PRN
  Administered 2020-07-13 – 2020-07-17 (×2): 650 mg via ORAL
  Filled 2020-07-13 (×2): qty 2

## 2020-07-13 MED ORDER — METOPROLOL TARTRATE 50 MG PO TABS
100.0000 mg | ORAL_TABLET | Freq: Two times a day (BID) | ORAL | Status: DC
  Administered 2020-07-14 – 2020-07-15 (×3): 100 mg via ORAL
  Filled 2020-07-13: qty 4
  Filled 2020-07-13 (×2): qty 2

## 2020-07-13 MED ORDER — HYDRALAZINE HCL 20 MG/ML IJ SOLN: 5.0000 mg | INTRAMUSCULAR | Status: DC | PRN

## 2020-07-13 MED ORDER — METOPROLOL TARTRATE 25 MG PO TABS
100.0000 mg | ORAL_TABLET | Freq: Once | ORAL | Status: AC
  Administered 2020-07-13: 100 mg via ORAL
  Filled 2020-07-13: qty 4

## 2020-07-13 MED ORDER — ONDANSETRON HCL 4 MG PO TABS: 4.0000 mg | ORAL_TABLET | Freq: Four times a day (QID) | ORAL | Status: DC | PRN

## 2020-07-13 MED ORDER — METOPROLOL TARTRATE 5 MG/5ML IV SOLN
2.5000 mg | INTRAVENOUS | Status: DC | PRN
  Administered 2020-07-13 – 2020-07-14 (×6): 2.5 mg via INTRAVENOUS
  Filled 2020-07-13 (×3): qty 5

## 2020-07-13 MED ORDER — MORPHINE SULFATE (PF) 2 MG/ML IV SOLN: 2.0000 mg | INTRAVENOUS | Status: DC | PRN

## 2020-07-13 MED ORDER — PANTOPRAZOLE SODIUM 40 MG IV SOLR
40.0000 mg | Freq: Two times a day (BID) | INTRAVENOUS | Status: DC
  Administered 2020-07-13 – 2020-07-15 (×3): 40 mg via INTRAVENOUS
  Filled 2020-07-13 (×3): qty 40

## 2020-07-13 MED ORDER — PANTOPRAZOLE SODIUM 40 MG IV SOLR
40.0000 mg | Freq: Once | INTRAVENOUS | Status: AC
  Administered 2020-07-13: 40 mg via INTRAVENOUS
  Filled 2020-07-13: qty 40

## 2020-07-13 MED ORDER — LACTATED RINGERS IV SOLN: INTRAVENOUS | Status: DC

## 2020-07-13 MED ORDER — ACETAMINOPHEN 650 MG RE SUPP: 650.0000 mg | Freq: Four times a day (QID) | RECTAL | Status: DC | PRN

## 2020-07-13 MED ORDER — ONDANSETRON HCL 4 MG/2ML IJ SOLN: 4.0000 mg | Freq: Four times a day (QID) | INTRAMUSCULAR | Status: DC | PRN

## 2020-07-13 MED ORDER — METOPROLOL TARTRATE 5 MG/5ML IV SOLN
5.0000 mg | Freq: Once | INTRAVENOUS | Status: AC
  Administered 2020-07-13: 5 mg via INTRAVENOUS
  Filled 2020-07-13: qty 5

## 2020-07-13 NOTE — ED Notes (Signed)
Pt repeatedly being verbally aggressive towards staff d/t wait times

## 2020-07-13 NOTE — Discharge Instructions (Addendum)
You were seen today for your atrial fibrillation.  Take your medications as prescribed.  Follow-up with cardiology closely for titration of medications.  You were given your home metoprolol dose this morning.  Do not take at 7 AM.

## 2020-07-13 NOTE — ED Notes (Addendum)
1700 Pt remains in Afib while pt is sleeping Pt HR increase to 140s and trends between 114-120 notified MD if anything they want to give for pt HR  1718 Orders receive to give lopressor

## 2020-07-13 NOTE — ED Provider Notes (Signed)
[place] EMERGENCY DEPARTMENT Provider Note   CSN: [id number] Arrival date & time: 07/13/20  0941     History Chief Complaint  Patient presents with  . Emesis    Glen [name] is a 86 y.o. male with past medical history of permanent A. fib on aspirin, hypertension, hyperlipidemia, who presents to the ED for a fall and coffee-ground emesis.  Patient was seen in the ED 1 day prior for a fall from home.  When patient came back home from the ED, he had another fall.  Patient states that he tried to get off from his bed to use the bathroom.  When he walked by the kitchen, he felt that his heart was racing and collapsed.  Patient states that he hit his head on the floor but denies loss of consciousness.  He said that he felt confused and disoriented after but try to pull himself up and call 911.  Daughter-in-law states that the EMS noticed some coffee-ground emesis.  Patient only vomited after the fall.  He endorses trouble swallowing as well.  He states that there is some blood in his urine which she contributes to injury to the tip of his penis from peeing a urinal yesterday.  He denies dysuria or frequency.  He also said that his stool is black this morning.  Patient is not taking Coumadin.  HPI     Past Medical History:  Diagnosis Date  . Atrial fibrillation ([place])   . Diverticulitis   . Drug therapy    Anticoagulation Therapy  . Family history of diabetes insipidus   . FHx: colon cancer   . History of colonic polyps   . Hx of echocardiogram    a. Echo 5/11:  EF 55-60%, mild dilated ascending aorta, mod LAE  . Hyperlipidemia   . Hypertension   . Neoplasm of prostate    special screening malignant   . Obesity   . Palpitation     Patient Active Problem List   Diagnosis Date Noted  . Fall 07/13/2020  . Gastrointestinal hemorrhage 07/13/2020  . Thrombocytopenia ([place]) 06/25/2019  . Osteoarthritis 06/25/2019  . Onychomycosis 07/29/2018  . Dysphagia  07/29/2018  . Peripheral edema 11/18/2015  . Impaired glucose tolerance 04/19/2015  . Obesity 04/19/2015  . Encounter for therapeutic drug monitoring 10/27/2013  . Current use of long term anticoagulation 06/14/2011  . Essential hypertension 01/20/2009  . Atrial fibrillation, chronic ([employer]) 03/13/2008  . Dyslipidemia 07/31/2007  . History of colonic polyps 07/31/2007    Past Surgical History:  Procedure Laterality Date  . 2-D Echocardiogram  November 2009, 2011  . Anal Fistula Repair    . CARDIAC VALVE SURGERY  1996   status post balloon valvuloplasty at [facility]  . CATARACT EXTRACTION  2004  . COLONOSCOPY  December 2005  . ETT  1991   Negative  . KNEE ARTHROSCOPY     Right  . TONSILLECTOMY         Family History  Problem Relation Age of Onset  . Heart attack Father   . Colon cancer Other   . Diabetes Other   . Prostate cancer Brother   . Heart disease Other   . Coronary artery disease Other        Two siblings, status post stenting to siblings with heart disease. One. Status post pacemaker insertion.  . Stroke Brother     Social History   Tobacco Use  . Smoking status: Never Smoker  . Smokeless tobacco: Never  Used  Substance Use Topics  . Alcohol use: No  . Drug use: Never    Home Medications Prior to Admission medications   Medication Sig Start Date End Date Taking? Authorizing Provider  acetaminophen (TYLENOL) 500 MG tablet Take 1,000 mg by mouth every 6 (six) hours as needed for pain.    Yes [provider]  aspirin EC 81 MG tablet Take 1 tablet (81 mg total) by mouth daily. 12/04/19  Yes Troy Sine, MD  hydrochlorothiazide (MICROZIDE) 12.5 MG capsule Take 1 capsule (12.5 mg total) by mouth as needed. 12/04/19 07/13/20 Yes Troy Sine, MD  metoprolol tartrate (LOPRESSOR) 100 MG tablet Take 1 tablet (100 mg total) by mouth 2 (two) times daily. 06/28/20  Yes Troy Sine, MD  Turmeric 500 MG CAPS Take 1,500 mg by mouth daily.   Yes [provider]    Allergies    Codeine  Review of Systems   Review of Systems  HENT: Positive for trouble swallowing.   Respiratory: Negative for chest tightness and shortness of breath.   Cardiovascular: Positive for palpitations and leg swelling. Negative for chest pain.  Gastrointestinal: Positive for abdominal pain, blood in stool and vomiting.  Genitourinary: Negative for dysuria and frequency.  Neurological: Positive for dizziness and weakness.    Physical Exam Updated Vital Signs BP (!) 100/59   Pulse (!) 112   Temp 98.4 F (36.9 C) (Oral)   Resp 10   Ht 6' (1.829 m)   Wt 103 kg   SpO2 98%   BMI 30.79 kg/m   Physical Exam Constitutional:      General: He is not in acute distress. HENT:     Head: Normocephalic.  Eyes:     General:        Right eye: No discharge.        Left eye: No discharge.     Pupils: Pupils are equal, round, and reactive to light.  Cardiovascular:     Rate and Rhythm: Tachycardia present.  Pulmonary:     Effort: No respiratory distress.     Breath sounds: Normal breath sounds. No wheezing.  Abdominal:     General: Bowel sounds are normal.     Palpations: Abdomen is soft.     Tenderness: There is abdominal tenderness. There is no right CVA tenderness or left CVA tenderness.     Comments: Mild tenderness to palpation epigastric region  Genitourinary:    Comments: A small abrasion noticed at the tip of the penis Musculoskeletal:        General: Normal range of motion.     Cervical back: Normal range of motion.     Right lower leg: Edema present.     Left lower leg: Edema present.     Comments: Trace edema bilateral  Skin:    General: Skin is warm.  Neurological:     Mental Status: He is alert.     Comments: PERRLA Cranial nerves no deficit Normal sensation upper and lower extremity bilaterally Strength 5/5 right upper extremity Strength 4/5 left upper extremity Strength 5/5 bilateral lower extremities  Psychiatric:         Mood and Affect: Mood normal.     ED Results / Procedures / Treatments   Labs (all labs ordered are listed, but only abnormal results are displayed) Labs Reviewed  COMPREHENSIVE METABOLIC PANEL - Abnormal; Notable for the following components:      Result Value   CO2 16 (*)  Glucose, Bld 164 (*)    BUN 63 (*)    Calcium 8.6 (*)    Total Protein 5.2 (*)    Albumin 3.1 (*)    All other components within normal limits  POC OCCULT BLOOD, ED - Abnormal; Notable for the following components:   Fecal Occult Bld POSITIVE (*)    All other components within normal limits  I-STAT CHEM 8, ED - Abnormal; Notable for the following components:   BUN 57 (*)    Glucose, Bld 144 (*)    Calcium, Ion 1.14 (*)    TCO2 20 (*)    Hemoglobin 11.6 (*)    HCT 34.0 (*)    All other components within normal limits  RESPIRATORY PANEL BY RT PCR (FLU A&B, COVID)  LIPASE, BLOOD  CBC  TYPE AND SCREEN  ABO/RH    EKG EKG Interpretation  Date/Time:  Tuesday July 13 2020 09:43:31 EDT Ventricular Rate:  111 PR Interval:    QRS Duration: 68 QT Interval:  330 QTC Calculation: 448 R Axis:   12 Text Interpretation: Atrial fibrillation with rapid ventricular response with premature ventricular or aberrantly conducted complexes Nonspecific T wave abnormality Abnormal ECG No significant change since last tracing Confirmed by Deno Etienne [phone]) on 07/13/2020 11:15:10 AM   Radiology DG Ankle 2 Views Right  Result Date: 07/12/2020 CLINICAL DATA:  Palpitations. EXAM: RIGHT ANKLE - 2 VIEW COMPARISON:  None. FINDINGS: There is no evidence of fracture, dislocation, or joint effusion. Plantar and posterior calcaneal spurs. Well-corticated ossific density inferior to the anterior aspect of the calcaneus on lateral view likely related to old fractures. There is no evidence of severe arthropathy or aggressive appearing focal bone abnormality. Diffuse mild subcutaneus soft tissue edema. Vascular calcifications.  IMPRESSION: No acute displaced fracture or dislocation of the bones of the right ankle on this two view radiograph. Electronically Signed   By: Iven Finn M.D.   On: 07/12/2020 22:58   CT Head Wo Contrast  Result Date: 07/13/2020 CLINICAL DATA:  Head trauma, minor. Additional history provided: Emesis/fall. EXAM: CT HEAD WITHOUT CONTRAST TECHNIQUE: Contiguous axial images were obtained from the base of the skull through the vertex without intravenous contrast. COMPARISON:  Head CT 11/08/2018. FINDINGS: Brain: Mild generalized cerebral atrophy. Mild ill-defined hypoattenuation within the cerebral white matter is nonspecific, but consistent with chronic small vessel ischemic disease. There is no acute intracranial hemorrhage. No demarcated cortical infarct. No extra-axial fluid collection. No evidence of intracranial mass. No midline shift. Vascular: No hyperdense vessel.  Atherosclerotic calcifications. Skull: Normal. Negative for fracture or focal lesion. Sinuses/Orbits: Visualized orbits show no acute finding. Mild ethmoid and maxillary sinus mucosal thickening. No significant mastoid effusion. IMPRESSION: No evidence of acute intracranial abnormality. Mild generalized cerebral atrophy and chronic small vessel ischemic disease, stable as compared to the head CT of 11/08/2018. Mild ethmoid and maxillary sinus mucosal thickening. Electronically Signed   By: Kellie Simmering DO   On: 07/13/2020 13:42   DG Chest Portable 1 View  Result Date: 07/12/2020 CLINICAL DATA:  Palpitation EXAM: PORTABLE CHEST 1 VIEW COMPARISON:  Chest x-ray 11/18/2015 FINDINGS: The heart size and mediastinal contours are unchanged. Aortic arch calcification. Similar-appearing left lower lobe linear atelectasis versus more likely scarring. No focal consolidation. No pulmonary edema. No pleural effusion. No pneumothorax. No acute osseous abnormality. Multilevel degenerative changes of the spine. Degenerative changes of bilateral  acromioclavicular joints. Right neck subcutaneus soft tissue asymmetry compared to the left. Vascular calcifications within the neck. IMPRESSION: 1.  Right neck subcutaneus soft tissue asymmetry compared to the left. Correlate with physical exam to exclude underlying mass. Findings could be due to overlying soft tissues or something external to the patient. 2. No acute cardiopulmonary abnormality. Electronically Signed   By: Iven Finn M.D.   On: 07/12/2020 22:55    Procedures Procedures (including critical care time)  Medications Ordered in ED Medications  pantoprazole (PROTONIX) injection 40 mg (40 mg Intravenous Given 07/13/20 1337)    ED Course  I have reviewed the triage vital signs and the nursing notes.  Pertinent labs & imaging results that were available during my care of the patient were reviewed by me and considered in my medical decision making (see chart for details).  Patient seen and examined.  Neuro exam is remarkable for left arm strength 4/5 at.  No midline tenderness.  Normal range of motion of all extremities.  CT head was obtained due to head trauma.  FOBT was performed which is positive.  Stool looks black.  Hemoglobin stable at 11.9.  GI was consulted.  BP 100/60 (BP Location: Left Arm)   Pulse 100   Temp 98.4 F (36.9 C) (Oral)   Resp 18   Ht 6' (1.829 m)   Wt 103 kg   SpO2 95%   BMI 30.79 kg/m   CT head came back negative for fracture or intracranial hemorrhage.  Patient will be admitted to the hospitalist service for further management.    MDM Rules/Calculators/A&P                          Patient presents to the ED for fall and coffee-ground emesis.  He was seen 1 day prior for another fall.  Patient had another fall after coming home from the ED.  He reports palpitation per before falling.  This time patient hit his head on the ground.  Denies loss of consciousness.  Etiology of his falls is likely due to his A. fib.  Patient is not on Coumadin, only  on aspirin 81 mg.  CT head is negative for fracture or any intracranial hemorrhage.  Coffee-ground emesis also noticed by the EMS.  Patient states that he has not had any vomiting prior to his fall.  FOBT positive.  Hemoglobin stable at 11.9.  GI was consulted.  Patient will be admitted to the hospitalist service for further management.  Final Clinical Impression(s) / ED Diagnoses Final diagnoses:  Fall, subsequent encounter  Gastrointestinal hemorrhage, unspecified gastrointestinal hemorrhage type  Atrial fibrillation, chronic [facility])    Rx / DC Orders ED Discharge Orders    None       Gaylan Gerold, DO 07/13/20 [place], DO 07/13/20 1424

## 2020-07-13 NOTE — H&P (Signed)
History and Physical    Glen [name] XNT:[id number] DOB: 1927-10-31 DOA: 07/13/2020  PCP: [name], MD Consultants:  Claiborne Billings - cardiology Patient coming from: Home - lives with wife; [facility]: Wife or son, [phone]  Chief Complaint: Hematemesis, dizziness, fall  HPI: Glen [name] is a 86 y.o. male with medical history significant of class 1 obesity (BMI 30.8); prostate cancer; HTN; HLD; and afib presenting with hematemesis, dizziness, fall. Yesterday afternoon, he had an electrician working on his house.  He got dizzy and his heart felt like it was racing.  He collapsed into the floor and scooted until he could get up.  He called 911 and they recommended transport.  In the ER, he sat for over 3 hours and by then it slowed down.  They gave him  Metoprolol and sent him home.  He felt like he needed to have a BM - had a large dark BM and went back to bed.  He fell against the side of the bed and hit his head.  He was disoriented.  He tried to go to the bathroom again and fell again and 911 recommended he come to the ER.  He vomited 5-6 times with coffee ground emesis.  He has had dark stools the last few days.  No SOB.  He is not on [facility] for afib - he was on Coumadin for 20 years and told Dr. Claiborne Billings he didn't need it and "convinced" him to transition to 81 mg ASA.    ED Course:  H/o afib, on ASA.  Fall, coffee ground emesis.  Seen yesterday for fall and d/c.  Fall today and hit head, CT pending.  Hgb 11.9, stable.  GI consult is pending.    Review of Systems: As per HPI; otherwise review of systems reviewed and negative.   Ambulatory Status:  Ambulates with a cane  COVID Vaccine Status:    Complete  Past Medical History:  Diagnosis Date  . Atrial fibrillation ([place])   . Diverticulitis   . Family history of diabetes insipidus   . FHx: colon cancer   . History of colonic polyps   . Hyperlipidemia   . Hypertension   . Neoplasm of prostate    special screening malignant   . Obesity     . Palpitation     Past Surgical History:  Procedure Laterality Date  . 2-D Echocardiogram  November 2009, 2011  . Anal Fistula Repair    . CARDIAC VALVE SURGERY  1996   status post balloon valvuloplasty at [facility]  . CATARACT EXTRACTION  2004  . COLONOSCOPY  December 2005  . ETT  1991   Negative  . KNEE ARTHROSCOPY     Right  . TONSILLECTOMY      Social History   Socioeconomic History  . Marital status: Married    Spouse name: Not on file  . Number of children: Not on file  . Years of education: Not on file  . Highest education level: Not on file  Occupational History  . Occupation: Retired    Fish farm manager: RETIRED  Tobacco Use  . Smoking status: Never Smoker  . Smokeless tobacco: Never Used  Substance and Sexual Activity  . Alcohol use: No  . Drug use: Never  . Sexual activity: Not on file  Other Topics Concern  . Not on file  Social History Narrative   Regular exercise: Yes: [employer] 4 times/week   Social Determinants of Health   Financial Resource Strain:   .  Difficulty of Paying Living Expenses: Not on file  Food Insecurity:   . Worried About Charity fundraiser in the Last Year: Not on file  . Ran Out of Food in the Last Year: Not on file  Transportation Needs:   . Lack of Transportation (Medical): Not on file  . Lack of Transportation (Non-Medical): Not on file  Physical Activity:   . Days of Exercise per Week: Not on file  . Minutes of Exercise per Session: Not on file  Stress:   . Feeling of Stress : Not on file  Social Connections:   . Frequency of Communication with Friends and Family: Not on file  . Frequency of Social Gatherings with Friends and Family: Not on file  . Attends Religious Services: Not on file  . Active Member of Clubs or Organizations: Not on file  . Attends Archivist Meetings: Not on file  . Marital Status: Not on file  Intimate Partner Violence:   . Fear of Current or Ex-Partner: Not on file  . Emotionally Abused: Not on  file  . Physically Abused: Not on file  . Sexually Abused: Not on file    Allergies  Allergen Reactions  . Codeine Other (See Comments)    constpation    Family History  Problem Relation Age of Onset  . Heart attack Father   . Colon cancer Other   . Diabetes Other   . Prostate cancer Brother   . Heart disease Other   . Coronary artery disease Other        Two siblings, status post stenting to siblings with heart disease. One. Status post pacemaker insertion.  . Stroke Brother     Prior to Admission medications   Medication Sig Start Date End Date Taking? Authorizing Provider  acetaminophen (TYLENOL) 500 MG tablet Take 1,000 mg by mouth every 6 (six) hours as needed for pain.    Yes [provider]  aspirin EC 81 MG tablet Take 1 tablet (81 mg total) by mouth daily. 12/04/19  Yes Troy Sine, MD  hydrochlorothiazide (MICROZIDE) 12.5 MG capsule Take 1 capsule (12.5 mg total) by mouth as needed. 12/04/19 07/13/20 Yes Troy Sine, MD  metoprolol tartrate (LOPRESSOR) 100 MG tablet Take 1 tablet (100 mg total) by mouth 2 (two) times daily. 06/28/20  Yes Troy Sine, MD  Turmeric 500 MG CAPS Take 1,500 mg by mouth daily.   Yes [provider]    Physical Exam: Vitals:   07/13/20 1815 07/13/20 1830 07/13/20 1845 07/13/20 1900  BP: 110/84 96/69 112/78 107/66  Pulse: (!) 142 (!) 121 (!) 122 (!) 110  Resp: (!) 29 18 17  (!) 23  Temp:      TempSrc:      SpO2: 100% 98% 99% 100%  Weight:      Height:         . General:  Appears calm and comfortable and is NAD, loquacious with apparent mild cognitive impairment . Eyes:  PERRL, EOMI, normal lids, iris . ENT:  grossly normal hearing, lips & tongue, mmm; suboptimal dentition . Neck:  no LAD, masses or thyromegaly . Cardiovascular:  RRR, no m/r/g. No LE edema.  Marland Kitchen Respiratory:   CTA bilaterally with no wheezes/rales/rhonchi.  Normal respiratory effort. . Abdomen:  soft, NT, ND, NABS . Skin:  no rash or  induration seen on limited exam . Musculoskeletal:  grossly normal tone BUE/BLE, good ROM, no bony abnormality . Psychiatric:  grossly normal mood  and affect, speech fluent and appropriate, AOx3 . Neurologic:  CN 2-12 grossly intact, moves all extremities in coordinated fashion    Radiological Exams on Admission: DG Ankle 2 Views Right  Result Date: 07/12/2020 CLINICAL DATA:  Palpitations. EXAM: RIGHT ANKLE - 2 VIEW COMPARISON:  None. FINDINGS: There is no evidence of fracture, dislocation, or joint effusion. Plantar and posterior calcaneal spurs. Well-corticated ossific density inferior to the anterior aspect of the calcaneus on lateral view likely related to old fractures. There is no evidence of severe arthropathy or aggressive appearing focal bone abnormality. Diffuse mild subcutaneus soft tissue edema. Vascular calcifications. IMPRESSION: No acute displaced fracture or dislocation of the bones of the right ankle on this two view radiograph. Electronically Signed   By: Iven Finn M.D.   On: 07/12/2020 22:58   CT Head Wo Contrast  Result Date: 07/13/2020 CLINICAL DATA:  Head trauma, minor. Additional history provided: Emesis/fall. EXAM: CT HEAD WITHOUT CONTRAST TECHNIQUE: Contiguous axial images were obtained from the base of the skull through the vertex without intravenous contrast. COMPARISON:  Head CT 11/08/2018. FINDINGS: Brain: Mild generalized cerebral atrophy. Mild ill-defined hypoattenuation within the cerebral white matter is nonspecific, but consistent with chronic small vessel ischemic disease. There is no acute intracranial hemorrhage. No demarcated cortical infarct. No extra-axial fluid collection. No evidence of intracranial mass. No midline shift. Vascular: No hyperdense vessel.  Atherosclerotic calcifications. Skull: Normal. Negative for fracture or focal lesion. Sinuses/Orbits: Visualized orbits show no acute finding. Mild ethmoid and maxillary sinus mucosal thickening. No  significant mastoid effusion. IMPRESSION: No evidence of acute intracranial abnormality. Mild generalized cerebral atrophy and chronic small vessel ischemic disease, stable as compared to the head CT of 11/08/2018. Mild ethmoid and maxillary sinus mucosal thickening. Electronically Signed   By: Kellie Simmering DO   On: 07/13/2020 13:42   DG Chest Portable 1 View  Result Date: 07/12/2020 CLINICAL DATA:  Palpitation EXAM: PORTABLE CHEST 1 VIEW COMPARISON:  Chest x-ray 11/18/2015 FINDINGS: The heart size and mediastinal contours are unchanged. Aortic arch calcification. Similar-appearing left lower lobe linear atelectasis versus more likely scarring. No focal consolidation. No pulmonary edema. No pleural effusion. No pneumothorax. No acute osseous abnormality. Multilevel degenerative changes of the spine. Degenerative changes of bilateral acromioclavicular joints. Right neck subcutaneus soft tissue asymmetry compared to the left. Vascular calcifications within the neck. IMPRESSION: 1. Right neck subcutaneus soft tissue asymmetry compared to the left. Correlate with physical exam to exclude underlying mass. Findings could be due to overlying soft tissues or something external to the patient. 2. No acute cardiopulmonary abnormality. Electronically Signed   By: Iven Finn M.D.   On: 07/12/2020 22:55    EKG: Independently reviewed.  Afib with RVR with rate 111; nonspecific ST changes with no evidence of acute ischemia   Labs on Admission: I have personally reviewed the available labs and imaging studies at the time of the admission.  Pertinent labs:   Glucose 144 CO2 16 BUN 63/Creatinine 1.03/GFR >60 Albumin 3.1 WBC 17.6 -> 15.7 Hgb 12.6 -> 11.0 Heme positive   Assessment/Plan Principal Problem:   Gastrointestinal hemorrhage with hematemesis Active Problems:   Dyslipidemia   Essential hypertension   Atrial fibrillation, chronic (HCC)   Impaired glucose tolerance   Obesity   Fall   UGI  bleeding -Patient with dizziness and falls at home with subsequent hematemesis, likely secondary to upper GI bleeding.  -Most likely diagnosis is gastric or duodenal ulcer, although the patient has no abdominal pain.  -His Hgb  decreased from 12.6 to 11 through the day today.  -Type and screen were done in ED.  -will admit to progressive bed -GI consulted by ED, will follow up recommendations -NPO for possible EGD tomorrow -NS at 100 mL/hr -Start IV pantoprazole BID -Zofran IV for nausea -Avoid NSAIDs and SQ heparin -Maintain IV access (2 large bore IVs if possible). -Monitor closely and follow cbc, transfuse as necessary. -Hold ASA for now  Fall -Likely associated with subacute/acute GI bleeding -Will follow serial CBCs -Consider PT consult post-EGD  Chronic afib -Rate controlled with Lopressor at home -Due to low BPs in the ER but now improved, will treat with prn IV Lopressor pushes of 2.5 mg as needed for rapid HR -He took himself off AC remotely and takes only ASA daily - will hold  HTN -Low BPs while in the ER -Hold BP medications for now including HCTZ and Lopressor (except as above)  HLD -He does not appear to be taking medications for this issue at this time  Impaired glucose tolerance -Based on his advanced age, treatment for this issue appears to be unnecessary either acutely or long-term  Obesity Body mass index is 30.79 kg/m.  -Weight loss could be encouraged     Note: This patient has been tested and is negative for the novel coronavirus COVID-19. The patient has been fully vaccinated against COVID-19.    DVT prophylaxis:  SCDs Code Status:  DNR - confirmed with patient/family Family Communication: Daughter-in-law was present throughout evaluation Disposition Plan:  The patient is from: home  Anticipated d/c is to: home without [facility] services   Anticipated d/c date will depend on clinical response to treatment, likely several days  Patient is  currently: acutely ill Consults called: GI Admission status:  Admit - It is my clinical opinion that admission to [place] is reasonable and necessary because of the expectation that this patient will require hospital care that crosses at least 2 midnights to treat this condition based on the medical complexity of the problems presented.  Given the aforementioned information, the predictability of an adverse outcome is felt to be significant.    Karmen Bongo MD [facility]   How to contact the [facility] Attending or Consulting provider [facility] or covering provider during after hours [place], for this patient?  1. Check the care team in [facility] and look for a) attending/consulting [facility] provider listed and b) the [facility] team listed 2. Log into [email or web address] and use [place]'s universal password to access. If you do not have the password, please contact the hospital operator. 3. Locate the [facility] provider you are looking for under [facility] and page to a number that you can be directly reached. 4. If you still have difficulty reaching the provider, please page the [facility] (Director on Call) for the Hospitalists listed on amion for assistance.   07/13/2020, 7:27 PM

## 2020-07-13 NOTE — ED Triage Notes (Signed)
Pt bib ems from home with a fall from dizziness. Denies LOC, no blood thinners. Pt then started vomiting coffee ground emesis. 4mg  Zofran en route. 118/64 HR 100 afib

## 2020-07-13 NOTE — ED Notes (Signed)
ED Provider at bedside. 

## 2020-07-13 NOTE — ED Notes (Signed)
Per lab, CBC clotted.

## 2020-07-13 NOTE — ED Provider Notes (Signed)
[facility] EMERGENCY DEPARTMENT Provider Note   CSN: [id number] Arrival date & time: 07/12/20  2021     History Chief Complaint  Patient presents with  . Fall    Glen [name] is a 86 y.o. male.  HPI     This is a 86 year old male with persistent atrial fibrillation on aspirin, hypertension, hyperlipidemia who presents with atrial fibrillation.  Patient reports that he was at home this evening.  He began to feel like his heart was racing.  He got up to take his nighttime metoprolol and began to feel weak.  He did not syncopized but states he just "kind of fell to the floor."  He denies hitting his head or loss of consciousness.  He denies injury.  Per EMS he reported ankle pain.  However, patient states that his ankles always bother him when he swollen.  He reports compliance with his medications.  Currently he states he feels better.  He was given diltiazem by EMS and heart rate settled into the 90s.  He was initially noted to be in atrial fibrillation with a heart rate of 130-140s.  He denies any recent illnesses, fevers, cough, shortness of breath, diaphoresis, nausea, chest pain.  Past Medical History:  Diagnosis Date  . Atrial fibrillation ([place])   . Diverticulitis   . Drug therapy    Anticoagulation Therapy  . Family history of diabetes insipidus   . FHx: colon cancer   . History of colonic polyps   . Hx of echocardiogram    a. Echo 5/11:  EF 55-60%, mild dilated ascending aorta, mod LAE  . Hyperlipidemia   . Hypertension   . Neoplasm of prostate    special screening malignant   . Obesity   . Palpitation     Patient Active Problem List   Diagnosis Date Noted  . Thrombocytopenia ([place]) 06/25/2019  . Osteoarthritis 06/25/2019  . Onychomycosis 07/29/2018  . Dysphagia 07/29/2018  . Peripheral edema 11/18/2015  . Impaired glucose tolerance 04/19/2015  . Obesity 04/19/2015  . Encounter for therapeutic drug monitoring 10/27/2013  . Current use of  long term anticoagulation 06/14/2011  . Essential hypertension 01/20/2009  . Atrial fibrillation ([place]) 03/13/2008  . Dyslipidemia 07/31/2007  . History of colonic polyps 07/31/2007    Past Surgical History:  Procedure Laterality Date  . 2-D Echocardiogram  November 2009, 2011  . Anal Fistula Repair    . CARDIAC VALVE SURGERY  1996   status post balloon valvuloplasty at [facility]  . CATARACT EXTRACTION  2004  . COLONOSCOPY  December 2005  . ETT  1991   Negative  . KNEE ARTHROSCOPY     Right  . TONSILLECTOMY         Family History  Problem Relation Age of Onset  . Heart attack Father   . Colon cancer Other   . Diabetes Other   . Prostate cancer Brother   . Heart disease Other   . Coronary artery disease Other        Two siblings, status post stenting to siblings with heart disease. One. Status post pacemaker insertion.  . Stroke Brother     Social History   Tobacco Use  . Smoking status: Never Smoker  . Smokeless tobacco: Never Used  Substance Use Topics  . Alcohol use: No  . Drug use: Never    Home Medications Prior to Admission medications   Medication Sig Start Date End Date Taking? Authorizing Provider  acetaminophen (TYLENOL) 500 MG tablet  Take 1,000 mg by mouth every 6 (six) hours as needed for pain.     [provider]  aspirin EC 81 MG tablet Take 1 tablet (81 mg total) by mouth daily. 12/04/19   Troy Sine, MD  hydrochlorothiazide (MICROZIDE) 12.5 MG capsule Take 1 capsule (12.5 mg total) by mouth as needed. 12/04/19 03/03/20  Troy Sine, MD  metoprolol tartrate (LOPRESSOR) 100 MG tablet Take 1 tablet (100 mg total) by mouth 2 (two) times daily. 06/28/20   Troy Sine, MD    Allergies    Codeine  Review of Systems   Review of Systems  Constitutional: Negative for fever.  Respiratory: Negative for cough and shortness of breath.   Cardiovascular: Positive for palpitations. Negative for chest pain and leg swelling.  Gastrointestinal:  Negative for abdominal pain, nausea and vomiting.  All other systems reviewed and are negative.   Physical Exam Updated Vital Signs BP 111/72   Pulse 98   Temp 97.8 F (36.6 C) (Oral)   Resp (!) 24   Ht 1.829 m (6')   Wt 103 kg   SpO2 96%   BMI 30.80 kg/m   Physical Exam Vitals and nursing note reviewed.  Constitutional:      Appearance: He is well-developed.     Comments: Elderly, overweight  HENT:     Head: Normocephalic and atraumatic.     Nose: Nose normal.     Mouth/Throat:     Mouth: Mucous membranes are moist.  Eyes:     Pupils: Pupils are equal, round, and reactive to light.  Neck:     Comments: No masses noted Cardiovascular:     Rate and Rhythm: Tachycardia present. Rhythm irregular.     Heart sounds: Normal heart sounds. No murmur heard.   Pulmonary:     Effort: Pulmonary effort is normal. No respiratory distress.     Breath sounds: Normal breath sounds. No wheezing.  Abdominal:     General: Bowel sounds are normal.     Palpations: Abdomen is soft.     Tenderness: There is no abdominal tenderness. There is no rebound.  Musculoskeletal:     Cervical back: Neck supple.     Right lower leg: Edema present.     Left lower leg: Edema present.     Comments: Trace to 1+ bilateral lower extremity edema, no obvious deformities of the lower extremities, no tenderness to palpation over the malleolus, 2+ DP pulse  Lymphadenopathy:     Cervical: No cervical adenopathy.  Skin:    General: Skin is warm and dry.  Neurological:     Mental Status: He is alert and oriented to person, place, and time.  Psychiatric:        Mood and Affect: Mood normal.     ED Results / Procedures / Treatments   Labs (all labs ordered are listed, but only abnormal results are displayed) Labs Reviewed  BASIC METABOLIC PANEL - Abnormal; Notable for the following components:      Result Value   CO2 17 (*)    Glucose, Bld 170 (*)    BUN 51 (*)    All other components within normal  limits  CBC - Abnormal; Notable for the following components:   WBC 15.5 (*)    All other components within normal limits  URINALYSIS, ROUTINE W REFLEX MICROSCOPIC - Abnormal; Notable for the following components:   Color, Urine PINK (*)    APPearance CLOUDY (*)    Hgb urine  dipstick LARGE (*)    Ketones, ur 5 (*)    Protein, ur 100 (*)    RBC / HPF >50 (*)    All other components within normal limits  TROPONIN I (HIGH SENSITIVITY)  TROPONIN I (HIGH SENSITIVITY)    EKG EKG Interpretation  Date/Time:  Monday July 12 2020 20:50:36 EDT Ventricular Rate:  118 PR Interval:    QRS Duration: 68 QT Interval:  290 QTC Calculation: 406 R Axis:   2 Text Interpretation: Atrial fibrillation with rapid ventricular response Possible Inferior infarct , age undetermined Abnormal ECG When compared with ECG of 05/26/2013, No significant change was found Confirmed by Delora Fuel [id number]) on 07/13/2020 12:54:20 AM Also confirmed by Thayer Jew [phone])  on 07/13/2020 1:22:48 AM   EKG Interpretation  Date/Time:  Tuesday July 13 2020 03:05:51 EDT Ventricular Rate:  109 PR Interval:    QRS Duration: 79 QT Interval:  347 QTC Calculation: 468 R Axis:   6 Text Interpretation: Atrial fibrillation Abnormal R-wave progression, early transition Borderline T abnormalities, anterior leads Confirmed by Thayer Jew [phone]) on 07/13/2020 3:47:06 AM        Radiology DG Ankle 2 Views Right  Result Date: 07/12/2020 CLINICAL DATA:  Palpitations. EXAM: RIGHT ANKLE - 2 VIEW COMPARISON:  None. FINDINGS: There is no evidence of fracture, dislocation, or joint effusion. Plantar and posterior calcaneal spurs. Well-corticated ossific density inferior to the anterior aspect of the calcaneus on lateral view likely related to old fractures. There is no evidence of severe arthropathy or aggressive appearing focal bone abnormality. Diffuse mild subcutaneus soft tissue edema. Vascular calcifications.  IMPRESSION: No acute displaced fracture or dislocation of the bones of the right ankle on this two view radiograph. Electronically Signed   By: Iven Finn M.D.   On: 07/12/2020 22:58   DG Chest Portable 1 View  Result Date: 07/12/2020 CLINICAL DATA:  Palpitation EXAM: PORTABLE CHEST 1 VIEW COMPARISON:  Chest x-ray 11/18/2015 FINDINGS: The heart size and mediastinal contours are unchanged. Aortic arch calcification. Similar-appearing left lower lobe linear atelectasis versus more likely scarring. No focal consolidation. No pulmonary edema. No pleural effusion. No pneumothorax. No acute osseous abnormality. Multilevel degenerative changes of the spine. Degenerative changes of bilateral acromioclavicular joints. Right neck subcutaneus soft tissue asymmetry compared to the left. Vascular calcifications within the neck. IMPRESSION: 1. Right neck subcutaneus soft tissue asymmetry compared to the left. Correlate with physical exam to exclude underlying mass. Findings could be due to overlying soft tissues or something external to the patient. 2. No acute cardiopulmonary abnormality. Electronically Signed   By: Iven Finn M.D.   On: 07/12/2020 22:55    Procedures Procedures (including critical care time)  Medications Ordered in ED Medications  metoprolol tartrate (LOPRESSOR) injection 5 mg (5 mg Intravenous Given 07/13/20 0337)  metoprolol tartrate (LOPRESSOR) tablet 100 mg (100 mg Oral Given 07/13/20 0335)    ED Course  I have reviewed the triage vital signs and the nursing notes.  Pertinent labs & imaging results that were available during my care of the patient were reviewed by me and considered in my medical decision making (see chart for details).  Clinical Course as of Jul 14 431  Tue Jul 13, 2020  7124 Patient agitated and trying to pull himself up in bed.  I helped nursing reposition him.  After this exertion, heart rate noted to be in the 120s to 130s.  He states that he does not  feel well.  Repeat EKG requested.  Patient dosed IV metoprolol and his morning dose of p.o. metoprolol.   [CH]    Clinical Course User Index [CH] Broderick Fonseca, Barbette Hair, MD   MDM Rules/Calculators/A&P                          This patients CHA2DS2-VASc Score and unadjusted Ischemic Stroke Rate (% per year) is equal to 3.2 % stroke rate/year from a score of 3  Above score calculated as 1 point each if present [CHF, HTN, DM, Vascular=MI/PAD/Aortic Plaque, Age if 65-74, or Male] Above score calculated as 2 points each if present [Age > 75, or Stroke/TIA/TE]  Patient presents with palpitations and a fall from home.  He is overall nontoxic.  Was initially in atrial fibrillation with RVR.  He improved with 1 dose of diltiazem and had been in the waiting room for approximately 5 hours prior to my evaluation.  On my evaluation heart rate in the low 100s.  He is in persistent atrial fibrillation which is known in his history.  His vital signs have otherwise been reassuring.  He really only complains of the palpitations that he had.  He does report that he felt weak and lowered himself to the ground but denies overt fall, hitting his head, or loss of consciousness.  Work-up reviewed from triage.  X-rays of the right ankle are negative for acute fracture.  Lab work is largely at the patient's baseline including a BMP.  Troponin x2 is negative and doubt ACS.  He does have a leukocytosis of unclear significance to 15.5.  Denies any infectious symptoms.  Will obtain a urinalysis.  Chest x-ray without acute pneumothorax or pneumonia.  Urinalysis shows hematuria but no evidence of UTI.  Patient remained clinically stable.  At some point he did try to push himself up in the bed and exerted himself.  At that time he had an increase in heart rate into the 130s and became symptomatic.  This quickly recovered with rest.  He was given 1 dose of IV metoprolol in his home p.o. dose.  He is monitored closely with stable pulse  rates 90-110.  He is asymptomatic.  Will discharge home with close cardiology follow-up for titration of medications.  Final Clinical Impression(s) / ED Diagnoses Final diagnoses:  Persistent atrial fibrillation [facility])    Rx / DC Orders ED Discharge Orders    None       Joshva Labreck, Barbette Hair, MD 07/13/20 [phone]

## 2020-07-13 NOTE — Consult Note (Addendum)
[facility] Consult: 1:53 PM 07/13/2020  LOS: 0 days    Referring Provider: Dr Alfonse Spruce in ED Primary Care Physician:  [name], MD Primary Gastroenterologist:  unassigned    Reason for Consultation:  CGE.     HPI: Glen [name] is a 86 y.o. male.  Hx Afib, daily 81 ASA, no AC.  Diverticulitis.  repair of anal fistula.  Obesity.  Venous insufficiency.  O/A.     Vaccinated for Covid 19.    Hx colon polyps.  Colonoscopy 2007.  No report, no comments as to findings.   ED visit last night.  Dizzy w/o syncope, then fell, at home.  R ankle swelling and pain.  HR A fib to 130s >> 90s after Diltiazem.  Got upset by long wait and went home.   Returned to ED 0930 today, another episode of dizziness.  Vomited CGE, Zofran per EMS.  HR 100.    Pts story is that he first noticed persistent racing of his heart, this happens with some regularity but does not normally last this long.  That evening is when he developed acute weakness and slumped to the floor but did not lose consciousness.  Dragged himself over to the lounge chair in the living room and eventually, with the persuasion of his daughter-in-law, had his wife call 911. After staying in the ED for several hours he left. This morning he had several episodes of coffee like emesis and a liquid black stool.  Thinks he may have had black stool yesterday as well but is not certain.  Appetite generally depressed.  Occasional episodes of nausea but no vomiting.  Coughs a lot with p.o. intake whether liquid or solid.  Solid foods often delay or gets stuck at the bottom of his esophagus and at times he has to regurgitate contents back up and spit them out or attempt to swallow using lots of liquids to chase the solids.  This dysphagia has been going on for many months.   Also describes at least a few weeks of lots of p.o. associated burping.  He did see some blood in his urine yesterday, a abrasion is noted at the tip of the penis on today's exam.  Not seeing blood in today's urine. No ETOH, no NSAIDs, no PPIs, just low dose ASA at home.    Hgb 14.2 at 9 PM last night >> 11.6 at 1220 today.  Platelets normal. No INR. FOBT +.    BUN/creat 63/1.0 yest.  LFTs normal.   U/A: > 50 RBCs, 6 to 10 WBCs, no bacteria. CT head showing generalized atrophy, chronic SV ischemic disease, stable compared with 11/2018.  Mild ethmoid and maxillary sinus thickening    Past Medical History:  Diagnosis Date  . Atrial fibrillation ([place])   . Diverticulitis   . Drug therapy    Anticoagulation Therapy  . Family history of diabetes insipidus   . FHx: colon cancer   . History of colonic polyps   . Hx of echocardiogram    a. Echo 5/11:  EF 55-60%,  mild dilated ascending aorta, mod LAE  . Hyperlipidemia   . Hypertension   . Neoplasm of prostate    special screening malignant   . Obesity   . Palpitation     Past Surgical History:  Procedure Laterality Date  . 2-D Echocardiogram  November 2009, 2011  . Anal Fistula Repair    . CARDIAC VALVE SURGERY  1996   status post balloon valvul[facility]  . CATARACT EXTRACTION  2004  . COLONOSCOPY  December 2005  . ETT  1991   Negative  . KNEE ARTHROSCOPY     Right  . TONSILLECTOMY      Prior to Admission medications   Medication Sig Start Date End Date Taking? Authorizing Provider  acetaminophen (TYLENOL) 500 MG tablet Take 1,000 mg by mouth every 6 (six) hours as needed for pain.    Yes [provider]  aspirin EC 81 MG tablet Take 1 tablet (81 mg total) by mouth daily. 12/04/19  Yes Troy Sine, MD  hydrochlorothiazide (MICROZIDE) 12.5 MG capsule Take 1 capsule (12.5 mg total) by mouth as needed. 12/04/19 07/13/20 Yes Troy Sine, MD  metoprolol tartrate (LOPRESSOR) 100 MG tablet Take 1 tablet (100 mg  total) by mouth 2 (two) times daily. 06/28/20  Yes Troy Sine, MD  Turmeric 500 MG CAPS Take 1,500 mg by mouth daily.   Yes [provider]    Scheduled Meds:  Infusions:  PRN Meds:    Allergies as of 07/13/2020 - Review Complete 07/13/2020  Allergen Reaction Noted  . Codeine Other (See Comments) 07/31/2007    Family History  Problem Relation Age of Onset  . Heart attack Father   . Colon cancer Other   . Diabetes Other   . Prostate cancer Brother   . Heart disease Other   . Coronary artery disease Other        Two siblings, status post stenting to siblings with heart disease. One. Status post pacemaker insertion.  . Stroke Brother     Social History   Socioeconomic History  . Marital status: Married    Spouse name: Not on file  . Number of children: Not on file  . Years of education: Not on file  . Highest education level: Not on file  Occupational History  . Occupation: Retired    Fish farm manager: RETIRED  Tobacco Use  . Smoking status: Never Smoker  . Smokeless tobacco: Never Used  Substance and Sexual Activity  . Alcohol use: No  . Drug use: Never  . Sexual activity: Not on file  Other Topics Concern  . Not on file  Social History Narrative   Regular exercise: Yes: [employer] 4 times/week   Social Determinants of Health   Financial Resource Strain:   . Difficulty of Paying Living Expenses: Not on file  Food Insecurity:   . Worried About Charity fundraiser in the Last Year: Not on file  . Ran Out of Food in the Last Year: Not on file  Transportation Needs:   . Lack of Transportation (Medical): Not on file  . Lack of Transportation (Non-Medical): Not on file  Physical Activity:   . Days of Exercise per Week: Not on file  . Minutes of Exercise per Session: Not on file  Stress:   . Feeling of Stress : Not on file  Social Connections:   . Frequency of Communication with Friends and Family: Not on file  . Frequency of Social Gatherings with Friends  and  Family: Not on file  . Attends Religious Services: Not on file  . Active Member of Clubs or Organizations: Not on file  . Attends Archivist Meetings: Not on file  . Marital Status: Not on file  Intimate Partner Violence:   . Fear of Current or Ex-Partner: Not on file  . Emotionally Abused: Not on file  . Physically Abused: Not on file  . Sexually Abused: Not on file    REVIEW OF SYSTEMS: Constitutional: Some fatigue and weakness, not profound. ENT:  No nose bleeds Pulm: Not super active but no significant DOE. CV: Tachycardia, palpitations as per HPI these occur regularly.  No angina.  + LE edema.  GU:  No hematuria, no frequency GI: See HPI Heme: Denies unusual or excessive bleeding or bruising. Transfusions: None. Neuro:  No headaches, no peripheral tingling or numbness.  No seizures, no syncope.  Hearing deficit. Derm:  No itching, no rash or sores.  Endocrine:  No sweats or chills.  No polyuria or dysuria Immunization: Vaccinated for [place]. Travel:  None beyond local counties in last few months.    PHYSICAL EXAM: Vital signs in last 24 hours: Vitals:   07/13/20 1245 07/13/20 1330  BP: (!) 100/59   Pulse: 100 (!) 112  Resp: 14 10  Temp:    SpO2: 98% 98%   Wt Readings from Last 3 Encounters:  07/13/20 103 kg  07/12/20 103 kg  06/21/20 103 kg    General: Elderly gentleman looks good for 89.  Hard of hearing. Head: No facial asymmetry or swelling.  No signs of head trauma. Eyes: No conjunctival pallor.  No scleral icterus. Ears: HOH.  Able to communicate with patient using slow, clear, loud speech. Nose: No congestion, no discharge Mouth: Oral mucosa is moist, pink, clear.  Tongue midline.  Fair dentition. Neck: No JVD, no masses, no thyromegaly. Lungs: No labored breathing, no cough.  Lungs clear bilaterally with good breath sounds Heart: Tacky to the 120s.  Irregular. Abdomen: Soft.  Not tender, not distended.  No hernias, HSM, bruits,  masses.  Bowel sounds active..   Rectal: Deferred.  Stool submitted to lab earlier today is FOBT positive. Musc/Skeltl: No joint redness, swelling or gross deformity. Extremities: Trace, pitting pedal edema. Neurologic: Hard of hearing.  Oriented x3.  Moves all 4 limbs, strength not tested.  No tremors or gross deficits. Skin: No obvious abrasions but dermatologic survey was incomplete. Nodes: No cervical adenopathy Psych: Cooperative, pleasant.  Intake/Output from previous day: No intake/output data recorded. Intake/Output this shift: No intake/output data recorded.  LAB RESULTS: Recent Labs    07/12/20 2050 07/13/20 1222  WBC 15.5*  --   HGB 14.2 11.6*  HCT 42.9 34.0*  PLT 214  --    BMET Lab Results  Component Value Date   NA 140 07/13/2020   NA 139 07/13/2020   NA 138 07/12/2020   K 4.0 07/13/2020   K 4.9 07/13/2020   K 3.9 07/12/2020   CL 109 07/13/2020   CL 111 07/13/2020   CL 111 07/12/2020   CO2 16 (L) 07/13/2020   CO2 17 (L) 07/12/2020   CO2 23 06/21/2020   GLUCOSE 144 (H) 07/13/2020   GLUCOSE 164 (H) 07/13/2020   GLUCOSE 170 (H) 07/12/2020   BUN 57 (H) 07/13/2020   BUN 63 (H) 07/13/2020   BUN 51 (H) 07/12/2020   CREATININE 0.80 07/13/2020   CREATININE 1.03 07/13/2020   CREATININE 1.05 07/12/2020   CALCIUM 8.6 (  L) 07/13/2020   CALCIUM 9.0 07/12/2020   CALCIUM 8.9 06/21/2020   LFT Recent Labs    07/13/20 0956  PROT 5.2*  ALBUMIN 3.1*  AST 19  ALT 11  ALKPHOS 55  BILITOT 0.8   PT/INR Lab Results  Component Value Date   INR 4.3 (A) 12/03/2019   INR 2.9 06/25/2019   INR 1.9 (A) 12/11/2018   Hepatitis Panel No results for input(s): HEPBSAG, HCVAB, HEPAIGM, HEPBIGM in the last 72 hours. C-Diff No components found for: CDIFF Lipase     Component Value Date/Time   LIPASE 27 07/13/2020 1217    Drugs of Abuse  No results found for: LABOPIA, COCAINSCRNUR, LABBENZ, AMPHETMU, THCU, LABBARB   RADIOLOGY STUDIES: DG Ankle 2 Views  Right  Result Date: 07/12/2020 CLINICAL DATA:  Palpitations. EXAM: RIGHT ANKLE - 2 VIEW COMPARISON:  None. FINDINGS: There is no evidence of fracture, dislocation, or joint effusion. Plantar and posterior calcaneal spurs. Well-corticated ossific density inferior to the anterior aspect of the calcaneus on lateral view likely related to old fractures. There is no evidence of severe arthropathy or aggressive appearing focal bone abnormality. Diffuse mild subcutaneus soft tissue edema. Vascular calcifications. IMPRESSION: No acute displaced fracture or dislocation of the bones of the right ankle on this two view radiograph. Electronically Signed   By: Iven Finn M.D.   On: 07/12/2020 22:58   CT Head Wo Contrast  Result Date: 07/13/2020 CLINICAL DATA:  Head trauma, minor. Additional history provided: Emesis/fall. EXAM: CT HEAD WITHOUT CONTRAST TECHNIQUE: Contiguous axial images were obtained from the base of the skull through the vertex without intravenous contrast. COMPARISON:  Head CT 11/08/2018. FINDINGS: Brain: Mild generalized cerebral atrophy. Mild ill-defined hypoattenuation within the cerebral white matter is nonspecific, but consistent with chronic small vessel ischemic disease. There is no acute intracranial hemorrhage. No demarcated cortical infarct. No extra-axial fluid collection. No evidence of intracranial mass. No midline shift. Vascular: No hyperdense vessel.  Atherosclerotic calcifications. Skull: Normal. Negative for fracture or focal lesion. Sinuses/Orbits: Visualized orbits show no acute finding. Mild ethmoid and maxillary sinus mucosal thickening. No significant mastoid effusion. IMPRESSION: No evidence of acute intracranial abnormality. Mild generalized cerebral atrophy and chronic small vessel ischemic disease, stable as compared to the head CT of 11/08/2018. Mild ethmoid and maxillary sinus mucosal thickening. Electronically Signed   By: Kellie Simmering DO   On: 07/13/2020 13:42   DG  Chest Portable 1 View  Result Date: 07/12/2020 CLINICAL DATA:  Palpitation EXAM: PORTABLE CHEST 1 VIEW COMPARISON:  Chest x-ray 11/18/2015 FINDINGS: The heart size and mediastinal contours are unchanged. Aortic arch calcification. Similar-appearing left lower lobe linear atelectasis versus more likely scarring. No focal consolidation. No pulmonary edema. No pleural effusion. No pneumothorax. No acute osseous abnormality. Multilevel degenerative changes of the spine. Degenerative changes of bilateral acromioclavicular joints. Right neck subcutaneus soft tissue asymmetry compared to the left. Vascular calcifications within the neck. IMPRESSION: 1. Right neck subcutaneus soft tissue asymmetry compared to the left. Correlate with physical exam to exclude underlying mass. Findings could be due to overlying soft tissues or something external to the patient. 2. No acute cardiopulmonary abnormality. Electronically Signed   By: Iven Finn M.D.   On: 07/12/2020 22:55     IMPRESSION:   *   CGE, black stool.   Several weeks of dysphagia. R/o PUD, esophagitis, esoph stricture.     *   Dizziness, fall, no syncope  *    Hgb drop 14.2 to 11.6 (ist[place]) over  last 15 hours.    *    Hematuria  *    Afib.  RVR.  Troponin I negative x 2.  Non-ischemic ECG.  Previously on Coumadin, now only low dose ASA.   *   Asymmetric cervical soft tissues per CXR.      PLAN:     *    EGD.  ~ 10 AM tomorrow.   Sips of clears ok, ordered.  NPO after 5 AM tmrw Ordered Protonix 40 mg IV q 12.    Azucena Freed  07/13/2020, 1:53 PM Phone [phone]

## 2020-07-13 NOTE — Anesthesia Preprocedure Evaluation (Addendum)
Anesthesia Evaluation  Patient identified by MRN, date of birth, ID band Patient awake    Reviewed: Allergy & Precautions, NPO status , Patient's Chart, lab work & pertinent test results  History of Anesthesia Complications Negative for: history of anesthetic complications  Airway Mallampati: II  TM Distance: >3 FB Neck ROM: Full    Dental no notable dental hx. (+) Dental Advisory Given   Pulmonary neg pulmonary ROS,    Pulmonary exam normal        Cardiovascular hypertension, + dysrhythmias Atrial Fibrillation  Rhythm:Irregular Rate:Tachycardia  Study Conclusions   - Left ventricle: The cavity size was normal. There was mild  concentric hypertrophy. Systolic function was normal. The  estimated ejection fraction was in the range of 55% to 60%. Wall  motion was normal; there were no regional wall motion  abnormalities.  - Aortic valve: There was trivial regurgitation.  - Left atrium: The atrium was moderately dilated.     Neuro/Psych negative neurological ROS     GI/Hepatic Neg liver ROS, Hx of colon cancer   Endo/Other  negative endocrine ROS  Renal/GU negative Renal ROS     Musculoskeletal negative musculoskeletal ROS (+)   Abdominal   Peds  Hematology negative hematology ROS (+)   Anesthesia Other Findings   Reproductive/Obstetrics                            Anesthesia Physical Anesthesia Plan  ASA: IV  Anesthesia Plan: MAC   Post-op Pain Management:    Induction:   PONV Risk Score and Plan: 2 and Ondansetron and Propofol infusion  Airway Management Planned: Natural Airway  Additional Equipment:   Intra-op Plan:   Post-operative Plan:   Informed Consent: I have reviewed the patients History and Physical, chart, labs and discussed the procedure including the risks, benefits and alternatives for the proposed anesthesia with the patient or authorized  representative who has indicated his/her understanding and acceptance.     Dental advisory given  Plan Discussed with: CRNA and Anesthesiologist  Anesthesia Plan Comments:         Anesthesia Quick Evaluation

## 2020-07-13 NOTE — H&P (View-Only) (Signed)
[facility] Consult: 1:53 PM 07/13/2020  LOS: 0 days    Referring Provider: Dr Alfonse Spruce in ED Primary Care Physician:  [name], MD Primary Gastroenterologist:  unassigned    Reason for Consultation:  CGE.     HPI: Glen [name] is a 86 y.o. male.  Hx Afib, daily 81 ASA, no AC.  Diverticulitis.  repair of anal fistula.  Obesity.  Venous insufficiency.  O/A.     Vaccinated for Covid 19.    Hx colon polyps.  Colonoscopy 2007.  No report, no comments as to findings.   ED visit last night.  Dizzy w/o syncope, then fell, at home.  R ankle swelling and pain.  HR A fib to 130s >> 90s after Diltiazem.  Got upset by long wait and went home.   Returned to ED 0930 today, another episode of dizziness.  Vomited CGE, Zofran per EMS.  HR 100.    Pts story is that he first noticed persistent racing of his heart, this happens with some regularity but does not normally last this long.  That evening is when he developed acute weakness and slumped to the floor but did not lose consciousness.  Dragged himself over to the lounge chair in the living room and eventually, with the persuasion of his daughter-in-law, had his wife call 911. After staying in the ED for several hours he left. This morning he had several episodes of coffee like emesis and a liquid black stool.  Thinks he may have had black stool yesterday as well but is not certain.  Appetite generally depressed.  Occasional episodes of nausea but no vomiting.  Coughs a lot with p.o. intake whether liquid or solid.  Solid foods often delay or gets stuck at the bottom of his esophagus and at times he has to regurgitate contents back up and spit them out or attempt to swallow using lots of liquids to chase the solids.  This dysphagia has been going on for many months.   Also describes at least a few weeks of lots of p.o. associated burping.  He did see some blood in his urine yesterday, a abrasion is noted at the tip of the penis on today's exam.  Not seeing blood in today's urine. No ETOH, no NSAIDs, no PPIs, just low dose ASA at home.    Hgb 14.2 at 9 PM last night >> 11.6 at 1220 today.  Platelets normal. No INR. FOBT +.    BUN/creat 63/1.0 yest.  LFTs normal.   U/A: > 50 RBCs, 6 to 10 WBCs, no bacteria. CT head showing generalized atrophy, chronic SV ischemic disease, stable compared with 11/2018.  Mild ethmoid and maxillary sinus thickening    Past Medical History:  Diagnosis Date  . Atrial fibrillation ([place])   . Diverticulitis   . Drug therapy    Anticoagulation Therapy  . Family history of diabetes insipidus   . FHx: colon cancer   . History of colonic polyps   . Hx of echocardiogram    a. Echo 5/11:  EF 55-60%,  mild dilated ascending aorta, mod LAE  . Hyperlipidemia   . Hypertension   . Neoplasm of prostate    special screening malignant   . Obesity   . Palpitation     Past Surgical History:  Procedure Laterality Date  . 2-D Echocardiogram  November 2009, 2011  . Anal Fistula Repair    . CARDIAC VALVE SURGERY  1996   status post balloon valvuloplasty at [facility]  . CATARACT EXTRACTION  2004  . COLONOSCOPY  December 2005  . ETT  1991   Negative  . KNEE ARTHROSCOPY     Right  . TONSILLECTOMY      Prior to Admission medications   Medication Sig Start Date End Date Taking? Authorizing Provider  acetaminophen (TYLENOL) 500 MG tablet Take 1,000 mg by mouth every 6 (six) hours as needed for pain.    Yes [provider]  aspirin EC 81 MG tablet Take 1 tablet (81 mg total) by mouth daily. 12/04/19  Yes Troy Sine, MD  hydrochlorothiazide (MICROZIDE) 12.5 MG capsule Take 1 capsule (12.5 mg total) by mouth as needed. 12/04/19 07/13/20 Yes Troy Sine, MD  metoprolol tartrate (LOPRESSOR) 100 MG tablet Take 1 tablet (100 mg  total) by mouth 2 (two) times daily. 06/28/20  Yes Troy Sine, MD  Turmeric 500 MG CAPS Take 1,500 mg by mouth daily.   Yes [provider]    Scheduled Meds:  Infusions:  PRN Meds:    Allergies as of 07/13/2020 - Review Complete 07/13/2020  Allergen Reaction Noted  . Codeine Other (See Comments) 07/31/2007    Family History  Problem Relation Age of Onset  . Heart attack Father   . Colon cancer Other   . Diabetes Other   . Prostate cancer Brother   . Heart disease Other   . Coronary artery disease Other        Two siblings, status post stenting to siblings with heart disease. One. Status post pacemaker insertion.  . Stroke Brother     Social History   Socioeconomic History  . Marital status: Married    Spouse name: Not on file  . Number of children: Not on file  . Years of education: Not on file  . Highest education level: Not on file  Occupational History  . Occupation: Retired    Fish farm manager: RETIRED  Tobacco Use  . Smoking status: Never Smoker  . Smokeless tobacco: Never Used  Substance and Sexual Activity  . Alcohol use: No  . Drug use: Never  . Sexual activity: Not on file  Other Topics Concern  . Not on file  Social History Narrative   Regular exercise: Yes: [employer] 4 times/week   Social Determinants of Health   Financial Resource Strain:   . Difficulty of Paying Living Expenses: Not on file  Food Insecurity:   . Worried About Charity fundraiser in the Last Year: Not on file  . Ran Out of Food in the Last Year: Not on file  Transportation Needs:   . Lack of Transportation (Medical): Not on file  . Lack of Transportation (Non-Medical): Not on file  Physical Activity:   . Days of Exercise per Week: Not on file  . Minutes of Exercise per Session: Not on file  Stress:   . Feeling of Stress : Not on file  Social Connections:   . Frequency of Communication with Friends and Family: Not on file  . Frequency of Social Gatherings with Friends  and  Family: Not on file  . Attends Religious Services: Not on file  . Active Member of Clubs or Organizations: Not on file  . Attends Archivist Meetings: Not on file  . Marital Status: Not on file  Intimate Partner Violence:   . Fear of Current or Ex-Partner: Not on file  . Emotionally Abused: Not on file  . Physically Abused: Not on file  . Sexually Abused: Not on file    REVIEW OF SYSTEMS: Constitutional: Some fatigue and weakness, not profound. ENT:  No nose bleeds Pulm: Not super active but no significant DOE. CV: Tachycardia, palpitations as per HPI these occur regularly.  No angina.  + LE edema.  GU:  No hematuria, no frequency GI: See HPI Heme: Denies unusual or excessive bleeding or bruising. Transfusions: None. Neuro:  No headaches, no peripheral tingling or numbness.  No seizures, no syncope.  Hearing deficit. Derm:  No itching, no rash or sores.  Endocrine:  No sweats or chills.  No polyuria or dysuria Immunization: Vaccinated for [place]. Travel:  None beyond local counties in last few months.    PHYSICAL EXAM: Vital signs in last 24 hours: Vitals:   07/13/20 1245 07/13/20 1330  BP: (!) 100/59   Pulse: 100 (!) 112  Resp: 14 10  Temp:    SpO2: 98% 98%   Wt Readings from Last 3 Encounters:  07/13/20 103 kg  07/12/20 103 kg  06/21/20 103 kg    General: Elderly gentleman looks good for 89.  Hard of hearing. Head: No facial asymmetry or swelling.  No signs of head trauma. Eyes: No conjunctival pallor.  No scleral icterus. Ears: HOH.  Able to communicate with patient using slow, clear, loud speech. Nose: No congestion, no discharge Mouth: Oral mucosa is moist, pink, clear.  Tongue midline.  Fair dentition. Neck: No JVD, no masses, no thyromegaly. Lungs: No labored breathing, no cough.  Lungs clear bilaterally with good breath sounds Heart: Tacky to the 120s.  Irregular. Abdomen: Soft.  Not tender, not distended.  No hernias, HSM, bruits,  masses.  Bowel sounds active..   Rectal: Deferred.  Stool submitted to lab earlier today is FOBT positive. Musc/Skeltl: No joint redness, swelling or gross deformity. Extremities: Trace, pitting pedal edema. Neurologic: Hard of hearing.  Oriented x3.  Moves all 4 limbs, strength not tested.  No tremors or gross deficits. Skin: No obvious abrasions but dermatologic survey was incomplete. Nodes: No cervical adenopathy Psych: Cooperative, pleasant.  Intake/Output from previous day: No intake/output data recorded. Intake/Output this shift: No intake/output data recorded.  LAB RESULTS: Recent Labs    07/12/20 2050 07/13/20 1222  WBC 15.5*  --   HGB 14.2 11.6*  HCT 42.9 34.0*  PLT 214  --    BMET Lab Results  Component Value Date   NA 140 07/13/2020   NA 139 07/13/2020   NA 138 07/12/2020   K 4.0 07/13/2020   K 4.9 07/13/2020   K 3.9 07/12/2020   CL 109 07/13/2020   CL 111 07/13/2020   CL 111 07/12/2020   CO2 16 (L) 07/13/2020   CO2 17 (L) 07/12/2020   CO2 23 06/21/2020   GLUCOSE 144 (H) 07/13/2020   GLUCOSE 164 (H) 07/13/2020   GLUCOSE 170 (H) 07/12/2020   BUN 57 (H) 07/13/2020   BUN 63 (H) 07/13/2020   BUN 51 (H) 07/12/2020   CREATININE 0.80 07/13/2020   CREATININE 1.03 07/13/2020   CREATININE 1.05 07/12/2020   CALCIUM 8.6 (  L) 07/13/2020   CALCIUM 9.0 07/12/2020   CALCIUM 8.9 06/21/2020   LFT Recent Labs    07/13/20 0956  PROT 5.2*  ALBUMIN 3.1*  AST 19  ALT 11  ALKPHOS 55  BILITOT 0.8   PT/INR Lab Results  Component Value Date   INR 4.3 (A) 12/03/2019   INR 2.9 06/25/2019   INR 1.9 (A) 12/11/2018   Hepatitis Panel No results for input(s): HEPBSAG, HCVAB, HEPAIGM, HEPBIGM in the last 72 hours. C-Diff No components found for: CDIFF Lipase     Component Value Date/Time   LIPASE 27 07/13/2020 1217    Drugs of Abuse  No results found for: LABOPIA, COCAINSCRNUR, LABBENZ, AMPHETMU, THCU, LABBARB   RADIOLOGY STUDIES: DG Ankle 2 Views  Right  Result Date: 07/12/2020 CLINICAL DATA:  Palpitations. EXAM: RIGHT ANKLE - 2 VIEW COMPARISON:  None. FINDINGS: There is no evidence of fracture, dislocation, or joint effusion. Plantar and posterior calcaneal spurs. Well-corticated ossific density inferior to the anterior aspect of the calcaneus on lateral view likely related to old fractures. There is no evidence of severe arthropathy or aggressive appearing focal bone abnormality. Diffuse mild subcutaneus soft tissue edema. Vascular calcifications. IMPRESSION: No acute displaced fracture or dislocation of the bones of the right ankle on this two view radiograph. Electronically Signed   By: Iven Finn M.D.   On: 07/12/2020 22:58   CT Head Wo Contrast  Result Date: 07/13/2020 CLINICAL DATA:  Head trauma, minor. Additional history provided: Emesis/fall. EXAM: CT HEAD WITHOUT CONTRAST TECHNIQUE: Contiguous axial images were obtained from the base of the skull through the vertex without intravenous contrast. COMPARISON:  Head CT 11/08/2018. FINDINGS: Brain: Mild generalized cerebral atrophy. Mild ill-defined hypoattenuation within the cerebral white matter is nonspecific, but consistent with chronic small vessel ischemic disease. There is no acute intracranial hemorrhage. No demarcated cortical infarct. No extra-axial fluid collection. No evidence of intracranial mass. No midline shift. Vascular: No hyperdense vessel.  Atherosclerotic calcifications. Skull: Normal. Negative for fracture or focal lesion. Sinuses/Orbits: Visualized orbits show no acute finding. Mild ethmoid and maxillary sinus mucosal thickening. No significant mastoid effusion. IMPRESSION: No evidence of acute intracranial abnormality. Mild generalized cerebral atrophy and chronic small vessel ischemic disease, stable as compared to the head CT of 11/08/2018. Mild ethmoid and maxillary sinus mucosal thickening. Electronically Signed   By: Kellie Simmering DO   On: 07/13/2020 13:42   DG  Chest Portable 1 View  Result Date: 07/12/2020 CLINICAL DATA:  Palpitation EXAM: PORTABLE CHEST 1 VIEW COMPARISON:  Chest x-ray 11/18/2015 FINDINGS: The heart size and mediastinal contours are unchanged. Aortic arch calcification. Similar-appearing left lower lobe linear atelectasis versus more likely scarring. No focal consolidation. No pulmonary edema. No pleural effusion. No pneumothorax. No acute osseous abnormality. Multilevel degenerative changes of the spine. Degenerative changes of bilateral acromioclavicular joints. Right neck subcutaneus soft tissue asymmetry compared to the left. Vascular calcifications within the neck. IMPRESSION: 1. Right neck subcutaneus soft tissue asymmetry compared to the left. Correlate with physical exam to exclude underlying mass. Findings could be due to overlying soft tissues or something external to the patient. 2. No acute cardiopulmonary abnormality. Electronically Signed   By: Iven Finn M.D.   On: 07/12/2020 22:55     IMPRESSION:   *   CGE, black stool.   Several weeks of dysphagia. R/o PUD, esophagitis, esoph stricture.     *   Dizziness, fall, no syncope  *    Hgb drop 14.2 to 11.6 (istat) over  last 15 hours.    *    Hematuria  *    Afib.  RVR.  Troponin I negative x 2.  Non-ischemic ECG.  Previously on Coumadin, now only low dose ASA.   *   Asymmetric cervical soft tissues per CXR.      PLAN:     *    EGD.  ~ 10 AM tomorrow.   Sips of clears ok, ordered.  NPO after 5 AM tmrw Ordered Protonix 40 mg IV q 12.    Azucena Freed  07/13/2020, 1:53 PM Phone [phone]

## 2020-07-14 ENCOUNTER — Telehealth: Payer: Self-pay | Admitting: Cardiovascular Disease

## 2020-07-14 ENCOUNTER — Inpatient Hospital Stay (HOSPITAL_COMMUNITY): Payer: Medicare Other | Admitting: Anesthesiology

## 2020-07-14 ENCOUNTER — Encounter (HOSPITAL_COMMUNITY): Payer: Self-pay | Admitting: Internal Medicine

## 2020-07-14 ENCOUNTER — Encounter (HOSPITAL_COMMUNITY)
Admission: EM | Disposition: A | Discharge status: Home Health | Payer: Self-pay | Source: Home / Self Care | Attending: Student

## 2020-07-14 DIAGNOSIS — E6609 Other obesity due to excess calories: Secondary | ICD-10-CM

## 2020-07-14 DIAGNOSIS — R739 Hyperglycemia, unspecified: Secondary | ICD-10-CM

## 2020-07-14 DIAGNOSIS — K920 Hematemesis: Secondary | ICD-10-CM | POA: Diagnosis not present

## 2020-07-14 DIAGNOSIS — K264 Chronic or unspecified duodenal ulcer with hemorrhage: Secondary | ICD-10-CM | POA: Diagnosis not present

## 2020-07-14 DIAGNOSIS — I4819 Other persistent atrial fibrillation: Secondary | ICD-10-CM

## 2020-07-14 DIAGNOSIS — K2970 Gastritis, unspecified, without bleeding: Secondary | ICD-10-CM

## 2020-07-14 DIAGNOSIS — D649 Anemia, unspecified: Secondary | ICD-10-CM

## 2020-07-14 DIAGNOSIS — K921 Melena: Secondary | ICD-10-CM | POA: Diagnosis not present

## 2020-07-14 DIAGNOSIS — Z6830 Body mass index (BMI) 30.0-30.9, adult: Secondary | ICD-10-CM

## 2020-07-14 DIAGNOSIS — K209 Esophagitis, unspecified without bleeding: Secondary | ICD-10-CM

## 2020-07-14 DIAGNOSIS — K2990 Gastroduodenitis, unspecified, without bleeding: Secondary | ICD-10-CM

## 2020-07-14 DIAGNOSIS — R531 Weakness: Secondary | ICD-10-CM

## 2020-07-14 DIAGNOSIS — W19XXXD Unspecified fall, subsequent encounter: Secondary | ICD-10-CM

## 2020-07-14 DIAGNOSIS — I10 Essential (primary) hypertension: Secondary | ICD-10-CM

## 2020-07-14 DIAGNOSIS — D72829 Elevated white blood cell count, unspecified: Secondary | ICD-10-CM

## 2020-07-14 HISTORY — PX: ESOPHAGOGASTRODUODENOSCOPY: SHX1529

## 2020-07-14 HISTORY — PX: ESOPHAGOGASTRODUODENOSCOPY (EGD) WITH PROPOFOL: SHX5813

## 2020-07-14 HISTORY — PX: BIOPSY: SHX5522

## 2020-07-14 HISTORY — PX: HEMOSTASIS CLIP PLACEMENT: SHX6857

## 2020-07-14 LAB — BASIC METABOLIC PANEL
Anion gap: 8 (ref 5–15)
BUN: 38 mg/dL — ABNORMAL HIGH (ref 8–23)
CO2: 23 mmol/L (ref 22–32)
Calcium: 8.4 mg/dL — ABNORMAL LOW (ref 8.9–10.3)
Chloride: 108 mmol/L (ref 98–111)
Creatinine, Ser: 0.92 mg/dL (ref 0.61–1.24)
GFR, Estimated: >60 mL/min (ref >60)
Glucose, Bld: 103 mg/dL — ABNORMAL HIGH (ref 70–99)
Potassium: 4 mmol/L (ref 3.5–5.1)
Sodium: 139 mmol/L (ref 135–145)

## 2020-07-14 LAB — MRSA PCR SCREENING: MRSA by PCR: NEGATIVE

## 2020-07-14 LAB — CBC
HCT: 34.6 % — ABNORMAL LOW (ref 39.0–52.0)
HCT: 32.2 % — ABNORMAL LOW (ref 39.0–52.0)
Hemoglobin: 11.2 g/dL — ABNORMAL LOW (ref 13.0–17.0)
Hemoglobin: 10.8 g/dL — ABNORMAL LOW (ref 13.0–17.0)
MCH: 32.3 pg (ref 26.0–34.0)
MCH: 32.8 pg (ref 26.0–34.0)
MCHC: 32.4 g/dL (ref 30.0–36.0)
MCHC: 33.5 g/dL (ref 30.0–36.0)
MCV: 99.7 fL (ref 80.0–100.0)
MCV: 97.9 fL (ref 80.0–100.0)
Platelets: 174 10*3/uL (ref 150–400)
Platelets: 163 10*3/uL (ref 150–400)
RBC: 3.47 MIL/uL — ABNORMAL LOW (ref 4.22–5.81)
RBC: 3.29 MIL/uL — ABNORMAL LOW (ref 4.22–5.81)
RDW: 13.2 % (ref 11.5–15.5)
RDW: 13.3 % (ref 11.5–15.5)
WBC: 12.9 10*3/uL — ABNORMAL HIGH (ref 4.0–10.5)
WBC: 14.1 10*3/uL — ABNORMAL HIGH (ref 4.0–10.5)
nRBC: 0 % (ref 0.0–0.2)
nRBC: 0 % (ref 0.0–0.2)

## 2020-07-14 SURGERY — ESOPHAGOGASTRODUODENOSCOPY (EGD) WITH PROPOFOL
Anesthesia: Monitor Anesthesia Care

## 2020-07-14 MED ORDER — ONDANSETRON HCL 4 MG/2ML IJ SOLN
INTRAMUSCULAR | Status: DC | PRN
  Administered 2020-07-14: 4 mg via INTRAVENOUS

## 2020-07-14 MED ORDER — ESMOLOL HCL 100 MG/10ML IV SOLN
INTRAVENOUS | Status: DC | PRN
  Administered 2020-07-14: 20 ug via INTRAVENOUS

## 2020-07-14 MED ORDER — PHENYLEPHRINE HCL-NACL 10-0.9 MG/250ML-% IV SOLN
INTRAVENOUS | Status: DC | PRN
  Administered 2020-07-14: 50 ug/min via INTRAVENOUS

## 2020-07-14 MED ORDER — PROPOFOL 500 MG/50ML IV EMUL
INTRAVENOUS | Status: DC | PRN
  Administered 2020-07-14: 125 ug/kg/min via INTRAVENOUS

## 2020-07-14 MED ORDER — LIDOCAINE HCL (CARDIAC) PF 100 MG/5ML IV SOSY
PREFILLED_SYRINGE | INTRAVENOUS | Status: DC | PRN
  Administered 2020-07-14: 60 mg via INTRATRACHEAL

## 2020-07-14 MED ORDER — PHENYLEPHRINE HCL (PRESSORS) 10 MG/ML IV SOLN
INTRAVENOUS | Status: DC | PRN
  Administered 2020-07-14 (×2): 80 ug via INTRAVENOUS
  Administered 2020-07-14: 120 ug via INTRAVENOUS

## 2020-07-14 SURGICAL SUPPLY — 15 items

## 2020-07-14 NOTE — Telephone Encounter (Signed)
Spoke with pt's daughter in law and pt was recently hospitalized due to afib was there for observation and discharged after a short time and then returned to ED following day  after passing out and having  coffee ground like emesis the patient was found to have ulcers and ASA was stopped Pt's daughter in law requesting PHOSP f/u Appt made for 07/20/20 at 3:00 pm with Dr Harrell Gave .Adonis Housekeeper

## 2020-07-14 NOTE — ED Notes (Signed)
Patient transported to endo.  

## 2020-07-14 NOTE — Interval H&P Note (Signed)
History and Physical Interval Note: For EGD today to eval UGI bleeding. The nature of the procedure, as well as the risks, benefits, and alternatives were carefully and thoroughly reviewed with the patient. Ample time for discussion and questions allowed. The patient understood, was satisfied, and agreed to proceed.   CBC Latest Ref Rng & Units 07/14/2020 07/13/2020 07/13/2020  WBC 4.0 - 10.5 K/uL 12.9(H) 15.7(H) -  Hemoglobin 13.0 - 17.0 g/dL 11.2(L) 11.0(L) 11.6(L)  Hematocrit 39 - 52 % 34.6(L) 34.4(L) 34.0(L)  Platelets 150 - 400 K/uL 174 181 -     07/14/2020 9:50 AM  Glen [name]  has presented today for surgery, with the diagnosis of cge, anemia, gerd sxs, dysphagia.  The various methods of treatment have been discussed with the patient and family. After consideration of risks, benefits and other options for treatment, the patient has consented to  Procedure(s): ESOPHAGOGASTRODUODENOSCOPY (EGD) WITH PROPOFOL (N/A) as a surgical intervention.  The patient's history has been reviewed, patient examined, no change in status, stable for surgery.  I have reviewed the patient's chart and labs.  Questions were answered to the patient's satisfaction.     Glen [name]

## 2020-07-14 NOTE — Op Note (Addendum)
[facility] Patient Name: Glen [name] Procedure Date : 07/14/2020 MRN: [id number] Attending MD: [name] , MD Date of Birth: 1927-12-31 CSN: [id number] Age: 86 Admit Type: Inpatient Procedure:                Upper GI endoscopy Indications:              Coffee-ground emesis, Melena Providers:                Lajuan Lines. Hilarie Fredrickson, MD, Cleda Daub, RN, William Dalton, Technician Referring MD:             [facility], Dr. Lorin Mercy Medicines:                Monitored Anesthesia Care Complications:            No immediate complications. Estimated Blood Loss:     Estimated blood loss was minimal. Procedure:                Pre-Anesthesia Assessment:                           - Prior to the procedure, a History and Physical                            was performed, and patient medications and                            allergies were reviewed. The patient's tolerance of                            previous anesthesia was also reviewed. The risks                            and benefits of the procedure and the sedation                            options and risks were discussed with the patient.                            All questions were answered, and informed consent                            was obtained. Prior Anticoagulants: The patient has                            taken no previous anticoagulant or antiplatelet                            agents. ASA Grade Assessment: III - A patient with                            severe systemic disease. After reviewing the risks  and benefits, the patient was deemed in                            satisfactory condition to undergo the procedure.                           After obtaining informed consent, the endoscope was                            passed under direct vision. Throughout the                            procedure, the patient's blood pressure, pulse, and                             oxygen saturations were monitored continuously. The                            [id number]-H190 ([id number]) Olympus gastroscope was                            introduced through the mouth, and advanced to the                            second part of duodenum. The upper GI endoscopy was                            accomplished without difficulty. The patient                            tolerated the procedure well. Scope In: Scope Out: Findings:      The middle third of the esophagus and lower third of the esophagus were       mildly tortuous; dysmotility suspected.      LA Grade A (one or more mucosal breaks less than 5 mm, not extending       between tops of 2 mucosal folds) esophagitis with no bleeding was found       at the gastroesophageal junction. There was an area of mild nodularity       at the GE junction. This area was examined under white light and with       NBI. Biopsy from this nodular focus was taken with a cold forceps for       histology.      Diffuse mild inflammation characterized by congestion (edema), erythema       and granularity was found in the entire examined stomach. Biopsies were       taken with a cold forceps for histology and Helicobacter pylori testing.      One non-bleeding cratered duodenal ulcer with a flat red spot was found       in the duodenal bulb. The lesion was 10 mm in largest dimension. For       hemostasis, one hemostatic clip was successfully placed. There was no       bleeding at the end of the maneuver. There was surrounding bulbar       duodenitis, but no other ulcerations were  seen.      The second portion of the duodenum was normal. Impression:               - Tortuous esophagus likely in keeping with                            esophageal dysmotility/presbyesophagus.                           - Mild reflux esophagitis with no bleeding and                            focus of nodularity at GE junction. Biopsied.                            - Gastritis with no bleeding. Biopsied.                           - Duodenal ulcer with red spot (source of recent                            upper GI bleeding). Clip was placed.                           - Normal second portion of the duodenum. Moderate Sedation:      N/A Recommendation:           - Return patient to hospital ward for ongoing care.                           - Advance diet as tolerated.                           - Continue present medications.                           - Avoid aspirin, ibuprofen, naproxen, or other                            non-steroidal anti-inflammatory drugs for 8 weeks                            given recently bleeding duodenal ulcer. Okay for                            aspirin in 1 week if felt necessary per cardiology.                           - Await pathology results. I will follow-up H.                            Pylori status and recommend treatment if positive.                           - BID IV PPI today, then PO BID PPI starting  tomorrow x 8 weeks, then daily thereafter.                           - GI will see him tomorrow. Procedure Code(s):        --- Professional ---                           93570, 59, Esophagogastroduodenoscopy, flexible,                            transoral; with control of bleeding, any method                           43239, Esophagogastroduodenoscopy, flexible,                            transoral; with biopsy, single or multiple Diagnosis Code(s):        --- Professional ---                           Q39.9, Congenital malformation of esophagus,                            unspecified                           K21.00, Gastro-esophageal reflux disease with                            esophagitis, without bleeding                           K29.70, Gastritis, unspecified, without bleeding                           K26.9, Duodenal ulcer, unspecified as acute or                             chronic, without hemorrhage or perforation                           K92.0, Hematemesis                           K92.1, Melena (includes Hematochezia) CPT copyright 2019 American Medical Association. All rights reserved. The codes documented in this report are preliminary and upon coder review may  be revised to meet current compliance requirements. [name], MD 07/14/2020 11:01:36 AM This report has been signed electronically. Number of Addenda: 0

## 2020-07-14 NOTE — ED Notes (Signed)
Consent signed for endo. Son at bedside updated

## 2020-07-14 NOTE — Progress Notes (Signed)
PROGRESS NOTE  [name] BMW:[id number] DOB: 03/21/1928   PCP: [name], MD  Patient is from: Home - lives with wife; uses cane and walker at baseline.  NOK: Wife or son, [phone]  DOA: 07/13/2020 LOS: 1  Chief complaints: Coffee-ground emesis, dark stool, dizziness and fall  Brief Narrative / Interim history: 86 year old male with history of prostate cancer, A. fib not on anticoagulation, HTN, HLD and obesity presenting with hematemesis, melena, dizziness and fall, and admitted for symptomatic anemia due to GI bleed.   In ED, HR 114.  BP 89/66 but improved.  98% on room air.  WBC 17.6.  Hgb 12.6.  Hemoccult positive x2.  CO2 16. Cr 1.03.  BUN 63.  Troponin 5.  Twelve-lead EKG with A. fib at 109 and abnormal R wave progression.  COVID-19 and influenza PCR negative.  CT head without contrast without acute finding.  UA with large Hgb, 5 ketones and 100 protein.  CXR and right ankle x-ray without acute finding.  GI consulted, and patient was admitted for possible upper GI bleed.   The next day, hemoglobin down to 11.2.  EGD revealed tortuous esophagus, mild reflux esophagitis with no bleeding, focus of nodularity at the GE junction (biopsied) gastritis with no bleeding (biopsied), duodenal ulcer with red spot (source of recent upper GI bleed) that was clipped.  GI recommended ADAT and PPI twice daily for 8 weeks followed by daily.  H. pylori testing pending.   Subjective: Seen and examined earlier this morning before he went for EGD.  No major events overnight or this morning.  No complaints other than "mild" headache.  Patient and patient's son deny further hematemesis or melena but not a great historian.  Denies palpitation, chest pain or dyspnea.  Denies abdominal pain.  Objective: Vitals:   07/14/20 1113 07/14/20 1117 07/14/20 1128 07/14/20 1201  BP: 125/75 114/76 (!) 98/52 106/68  Pulse: 99 93 88 90  Resp: 17 (!) 21 (!) 23 (!) 22  Temp:      TempSrc:      SpO2: 100%  99% 97% 98%  Weight:      Height:        Intake/Output Summary (Last 24 hours) at 07/14/2020 1231 Last data filed at 07/14/2020 1030 Gross per 24 hour  Intake 1000 ml  Output 500 ml  Net 500 ml   Filed Weights   07/13/20 0941  Weight: 103 kg    Examination:  GENERAL: No apparent distress.  Nontoxic. HEENT: MMM.  Vision and hearing grossly intact.  NECK: Supple.  No apparent JVD.  RESP:  No IWOB.  Fair aeration bilaterally. CVS:  RRR. Heart sounds normal.  ABD/GI/GU: BS+. Abd soft, NTND.  MSK/EXT:  Moves extremities. No apparent deformity. No edema.  SKIN: no apparent skin lesion or wound NEURO: Awake, alert and fairly oriented.  No apparent focal neuro deficit. PSYCH: Calm. Normal affect.  Procedures:  10/13-EGD revealed tortuous esophagus, mild reflux esophagitis with no bleeding, focus of nodularity at the GE junction (biopsied) gastritis with no bleeding (biopsied), duodenal ulcer with red spot (source of recent upper GI bleed) that was clipped.  H. pylori testing pending.  Microbiology summarized: COVID-19 PCR negative. Influenza PCR negative.  Assessment & Plan: Symptomatic anemia due to upper GI bleed: Baseline Hgb 16-17> 14.2 (on 10/11)> 12.6 (admit)> 11.2.  Patient is only on low-dose aspirin. -EGD as above.  Biopsies and H. pylori testing pending. -Advance diet as tolerated per GI -Continue IV PPI, followed by  p.o. PPI twice daily for 8 weeks, then once daily -Avoid NSAIDs. -Discontinue IV fluid -Monitor H&H -SCD for VT prophylaxis  Fall at home/dizziness/generalized weakness: Syncope?  Has acute drop in H&H, and A. fib with mild RVR.  He was also slightly hypotensive.  Initial troponin negative.  EKG without significant finding other than A. fib with mild RVR and abnormal R wave progressions. -Continue monitoring on telemetry -Check orthostatic vitals -PT/OT eval  Persistent A. fib with mild RVR: On Lopressor for rate control.  Reportedly took himself  off warfarin after talking to his cardiologist, Dr. Claiborne Billings and restarted aspirin.  Currently rate controlled on home metoprolol. -Continue home metoprolol -Discontinue aspirin  Hypotension in patient with history of hypertension -Continue holding HCTZ.  Discontinue as needed hydralazine -Continue home metoprolol -Check orthostatic vitals  Hyperlipidemia -Continue home statin  Hyperglycemia without diagnosis of diabetes -Check hemoglobin A1c  Leukocytosis: WBC 17.6. -Based on his advanced age, treatment for this issue appears to be unnecessary either acutely or long-term  Hematuria: Large Hgb on UA.  Patient has no UTI symptoms. -Recommend repeat UA in 2 to 3 weeks  Note: This patient has been tested and is negative for the novel coronavirus COVID-19. The patient has been fully vaccinated against COVID-19.  Class I obesity Body mass index is 30.79 kg/m.         DVT prophylaxis:  SCDs Start: 07/13/20 1502  Code Status: DNR/DNI Family Communication: Updated patient's son at bedside. Status is: Inpatient  Remains inpatient appropriate because:Hemodynamically unstable and Inpatient level of care appropriate due to severity of illness   Dispo: The patient is from: Home              Anticipated d/c is to: To be determined              Anticipated d/c date is: 1 day              Patient currently is not medically stable to d/c.       Consultants:  Gastroenterology   Sch Meds:  Scheduled Meds: . metoprolol tartrate  100 mg Oral BID  . pantoprazole (PROTONIX) IV  40 mg Intravenous Q12H   Continuous Infusions: . lactated ringers 100 mL/hr at 07/14/20 0305   PRN Meds:.acetaminophen **OR** acetaminophen, hydrALAZINE, metoprolol tartrate, morphine injection, ondansetron **OR** ondansetron (ZOFRAN) IV  Antimicrobials: Anti-infectives (From admission, onward)   None       I have personally reviewed the following labs and images: CBC: Recent Labs  Lab  07/12/20 2050 07/13/20 1217 07/13/20 1222 07/13/20 1740 07/14/20 0234  WBC 15.5* 17.6*  --  15.7* 12.9*  HGB 14.2 12.6* 11.6* 11.0* 11.2*  HCT 42.9 38.4* 34.0* 34.4* 34.6*  MCV 97.5 98.7  --  98.6 99.7  PLT 214 192  --  181 174   BMP &GFR Recent Labs  Lab 07/12/20 2050 07/13/20 0956 07/13/20 1222 07/14/20 0234  NA 138 139 140 139  K 3.9 4.9 4.0 4.0  CL 111 111 109 108  CO2 17* 16*  --  23  GLUCOSE 170* 164* 144* 103*  BUN 51* 63* 57* 38*  CREATININE 1.05 1.03 0.80 0.92  CALCIUM 9.0 8.6*  --  8.4*   Estimated Creatinine Clearance: 66.3 mL/min (by C-G formula based on SCr of 0.92 mg/dL). Liver & Pancreas: Recent Labs  Lab 07/13/20 0956  AST 19  ALT 11  ALKPHOS 55  BILITOT 0.8  PROT 5.2*  ALBUMIN 3.1*   Recent Labs  Lab  07/13/20 1217  LIPASE 27   No results for input(s): AMMONIA in the last 168 hours. Diabetic: No results for input(s): HGBA1C in the last 72 hours. No results for input(s): GLUCAP in the last 168 hours. Cardiac Enzymes: No results for input(s): CKTOTAL, CKMB, CKMBINDEX, TROPONINI in the last 168 hours. No results for input(s): PROBNP in the last 8760 hours. Coagulation Profile: No results for input(s): INR, PROTIME in the last 168 hours. Thyroid Function Tests: No results for input(s): TSH, T4TOTAL, FREET4, T3FREE, THYROIDAB in the last 72 hours. Lipid Profile: No results for input(s): CHOL, HDL, LDLCALC, TRIG, CHOLHDL, LDLDIRECT in the last 72 hours. Anemia Panel: No results for input(s): VITAMINB12, FOLATE, FERRITIN, TIBC, IRON, RETICCTPCT in the last 72 hours. Urine analysis:    Component Value Date/Time   COLORURINE PINK (A) 07/13/2020 0205   APPEARANCEUR CLOUDY (A) 07/13/2020 0205   LABSPEC 1.021 07/13/2020 0205   PHURINE 5.0 07/13/2020 0205   GLUCOSEU NEGATIVE 07/13/2020 0205   HGBUR LARGE (A) 07/13/2020 0205   BILIRUBINUR NEGATIVE 07/13/2020 0205   KETONESUR 5 (A) 07/13/2020 0205   PROTEINUR 100 (A) 07/13/2020 0205    UROBILINOGEN 0.2 05/27/2013 0029   NITRITE NEGATIVE 07/13/2020 0205   LEUKOCYTESUR NEGATIVE 07/13/2020 0205   Sepsis Labs: Invalid input(s): PROCALCITONIN, [place]  Microbiology: Recent Results (from the past 240 hour(s))  Respiratory Panel by RT PCR (Flu A&B, Covid) - Nasopharyngeal Swab     Status: None   Collection Time: 07/13/20  2:49 PM   Specimen: Nasopharyngeal Swab  Result Value Ref Range Status   SARS Coronavirus 2 by RT PCR NEGATIVE NEGATIVE Final    Comment: (NOTE) SARS-CoV-2 target nucleic acids are NOT DETECTED.  The SARS-CoV-2 RNA is generally detectable in upper respiratoy specimens during the acute phase of infection. The lowest concentration of SARS-CoV-2 viral copies this assay can detect is 131 copies/mL. A negative result does not preclude SARS-Cov-2 infection and should not be used as the sole basis for treatment or other patient management decisions. A negative result may occur with  improper specimen collection/handling, submission of specimen other than nasopharyngeal swab, presence of viral mutation(s) within the areas targeted by this assay, and inadequate number of viral copies (<131 copies/mL). A negative result must be combined with clinical observations, patient history, and epidemiological information. The expected result is Negative.  Fact Sheet for Patients:  PinkCheek.be  Fact Sheet for Healthcare Providers:  GravelBags.it  This test is no t yet approved or cleared by the [place] FDA and  has been authorized for detection and/or diagnosis of SARS-CoV-2 by FDA under an Emergency Use Authorization (EUA). This EUA will remain  in effect (meaning this test can be used) for the duration of the COVID-19 declaration under Section 564(b)(1) of the Act, 21 U.S.C. section 360bbb-3(b)(1), unless the authorization is terminated or revoked sooner.     Influenza A by PCR NEGATIVE  NEGATIVE Final   Influenza B by PCR NEGATIVE NEGATIVE Final    Comment: (NOTE) The Xpert Xpress SARS-CoV-2/FLU/RSV assay is intended as an aid in  the diagnosis of influenza from Nasopharyngeal swab specimens and  should not be used as a sole basis for treatment. Nasal washings and  aspirates are unacceptable for Xpert Xpress SARS-CoV-2/FLU/RSV  testing.  Fact Sheet for Patients: PinkCheek.be  Fact Sheet for Healthcare Providers: GravelBags.it  This test is not yet approved or cleared by the [place] FDA and  has been authorized for detection and/or diagnosis of SARS-CoV-2 by  FDA under  an Emergency Use Authorization (EUA). This EUA will remain  in effect (meaning this test can be used) for the duration of the  Covid-19 declaration under Section 564(b)(1) of the Act, 21  U.S.C. section 360bbb-3(b)(1), unless the authorization is  terminated or revoked. Performed at [facility], [place] [address] [place], [place] [address]     Radiology Studies: CT Head Wo Contrast  Result Date: 07/13/2020 CLINICAL DATA:  Head trauma, minor. Additional history provided: Emesis/fall. EXAM: CT HEAD WITHOUT CONTRAST TECHNIQUE: Contiguous axial images were obtained from the base of the skull through the vertex without intravenous contrast. COMPARISON:  Head CT 11/08/2018. FINDINGS: Brain: Mild generalized cerebral atrophy. Mild ill-defined hypoattenuation within the cerebral white matter is nonspecific, but consistent with chronic small vessel ischemic disease. There is no acute intracranial hemorrhage. No demarcated cortical infarct. No extra-axial fluid collection. No evidence of intracranial mass. No midline shift. Vascular: No hyperdense vessel.  Atherosclerotic calcifications. Skull: Normal. Negative for fracture or focal lesion. Sinuses/Orbits: Visualized orbits show no acute finding. Mild ethmoid and maxillary sinus mucosal  thickening. No significant mastoid effusion. IMPRESSION: No evidence of acute intracranial abnormality. Mild generalized cerebral atrophy and chronic small vessel ischemic disease, stable as compared to the head CT of 11/08/2018. Mild ethmoid and maxillary sinus mucosal thickening. Electronically Signed   By: Kellie Simmering DO   On: 07/13/2020 13:42     Arlenis Blaydes [address] [place]  If 7PM-7AM, please contact night-coverage [email or web address] 07/14/2020, 12:31 PM

## 2020-07-14 NOTE — Anesthesia Postprocedure Evaluation (Signed)
Anesthesia Post Note  Patient: Glen [name]  Procedure(s) Performed: ESOPHAGOGASTRODUODENOSCOPY (EGD) WITH PROPOFOL (N/A ) HEMOSTASIS CLIP PLACEMENT     Patient location during evaluation: Endoscopy Anesthesia Type: MAC Level of consciousness: awake and alert Pain management: pain level controlled Vital Signs Assessment: post-procedure vital signs reviewed and stable Respiratory status: spontaneous breathing and respiratory function stable Cardiovascular status: stable Postop Assessment: no apparent nausea or vomiting Anesthetic complications: no   No complications documented.  Last Vitals:  Vitals:   07/14/20 1128 07/14/20 1201  BP: (!) 98/52 106/68  Pulse: 88 90  Resp: (!) 23 (!) 22  Temp:    SpO2: 97% 98%    Last Pain:  Vitals:   07/14/20 1128  TempSrc:   PainSc: 0-No pain                 Steaven Wholey,Yacob DANIEL

## 2020-07-14 NOTE — ED Notes (Signed)
Pt returned from endo. External catheter placed on pt. Son at bedside

## 2020-07-14 NOTE — Anesthesia Procedure Notes (Signed)
Procedure Name: MAC Date/Time: 07/14/2020 10:32 AM Performed by: Kathryne Hitch, CRNA Pre-anesthesia Checklist: Patient identified, Emergency Drugs available, Suction available and Patient being monitored Patient Re-evaluated:Patient Re-evaluated prior to induction Oxygen Delivery Method: Nasal cannula Preoxygenation: Pre-oxygenation with 100% oxygen Induction Type: IV induction Placement Confirmation: positive ETCO2 Dental Injury: Teeth and Oropharynx as per pre-operative assessment

## 2020-07-14 NOTE — Transfer of Care (Signed)
Immediate Anesthesia Transfer of Care Note  Patient: Glen [name]  Procedure(s) Performed: ESOPHAGOGASTRODUODENOSCOPY (EGD) WITH PROPOFOL (N/A ) HEMOSTASIS CLIP PLACEMENT  Patient Location: Endoscopy Unit  Anesthesia Type:MAC  Level of Consciousness: drowsy and patient cooperative  Airway & Oxygen Therapy: Patient Spontanous Breathing  Post-op Assessment: Report given to RN and Post -op Vital signs reviewed and stable  Post vital signs: Reviewed and stable  Last Vitals:  Vitals Value Taken Time  BP 101/73 07/14/20 1103  Temp 36.1 C 07/14/20 1102  Pulse 90 07/14/20 1104  Resp 16 07/14/20 1104  SpO2 97 % 07/14/20 1104  Vitals shown include unvalidated device data.  Last Pain:  Vitals:   07/14/20 1102  TempSrc: Temporal  PainSc: Asleep         Complications: No complications documented.

## 2020-07-14 NOTE — ED Notes (Signed)
This RN notified MD if an alternative medication can be given for pt A fib due to metoprolol being short acting

## 2020-07-14 NOTE — Telephone Encounter (Signed)
New Message:     Please call, wants to give you an update on pt's condition. Pt is now in the hospital, have some questions also.Glen [name]

## 2020-07-15 ENCOUNTER — Ambulatory Visit: Payer: Medicare Other | Admitting: Cardiology

## 2020-07-15 ENCOUNTER — Other Ambulatory Visit: Payer: Self-pay | Admitting: Physician Assistant

## 2020-07-15 DIAGNOSIS — K2270 Barrett's esophagus without dysplasia: Secondary | ICD-10-CM | POA: Diagnosis not present

## 2020-07-15 DIAGNOSIS — A048 Other specified bacterial intestinal infections: Secondary | ICD-10-CM

## 2020-07-15 DIAGNOSIS — I951 Orthostatic hypotension: Secondary | ICD-10-CM

## 2020-07-15 DIAGNOSIS — K264 Chronic or unspecified duodenal ulcer with hemorrhage: Secondary | ICD-10-CM | POA: Diagnosis not present

## 2020-07-15 LAB — CBC
HCT: 30.7 % — ABNORMAL LOW (ref 39.0–52.0)
Hemoglobin: 10.4 g/dL — ABNORMAL LOW (ref 13.0–17.0)
MCH: 32.9 pg (ref 26.0–34.0)
MCHC: 33.9 g/dL (ref 30.0–36.0)
MCV: 97.2 fL (ref 80.0–100.0)
Platelets: 142 10*3/uL — ABNORMAL LOW (ref 150–400)
RBC: 3.16 MIL/uL — ABNORMAL LOW (ref 4.22–5.81)
RDW: 13.4 % (ref 11.5–15.5)
WBC: 9.5 10*3/uL (ref 4.0–10.5)
nRBC: 0 % (ref 0.0–0.2)

## 2020-07-15 LAB — SURGICAL PATHOLOGY

## 2020-07-15 MED ORDER — PANTOPRAZOLE SODIUM 40 MG PO TBEC
40.0000 mg | DELAYED_RELEASE_TABLET | Freq: Two times a day (BID) | ORAL | Status: DC
  Administered 2020-07-15 – 2020-07-17 (×4): 40 mg via ORAL
  Filled 2020-07-15 (×4): qty 1

## 2020-07-15 MED ORDER — METOPROLOL TARTRATE 50 MG PO TABS
50.0000 mg | ORAL_TABLET | Freq: Two times a day (BID) | ORAL | Status: DC
  Administered 2020-07-15 – 2020-07-17 (×4): 50 mg via ORAL
  Filled 2020-07-15 (×4): qty 1

## 2020-07-15 NOTE — Progress Notes (Signed)
PROGRESS NOTE  Glen [name] ATF:[id number] DOB: 12/18/1927   PCP: [name], MD  Patient is from: Home - lives with wife; uses cane and walker at baseline.  NOK: Wife or son, [phone]  DOA: 07/13/2020 LOS: 2  Chief complaints: Coffee-ground emesis, dark stool, dizziness and fall  Brief Narrative / Interim history: 86 year old male with history of prostate cancer, A. fib not on anticoagulation, HTN, HLD and obesity presenting with hematemesis, melena, dizziness and fall, and admitted for symptomatic anemia due to GI bleed.   In ED, HR 114.  BP 89/66 but improved.  98% on room air.  WBC 17.6.  Hgb 12.6.  Hemoccult positive x2.  CO2 16. Cr 1.03.  BUN 63.  Troponin 5.  Twelve-lead EKG with A. fib at 109 and abnormal R wave progression.  COVID-19 and influenza PCR negative.  CT head without contrast without acute finding.  UA with large Hgb, 5 ketones and 100 protein.  CXR and right ankle x-ray without acute finding.  GI consulted, and patient was admitted for possible upper GI bleed.   The next day, hemoglobin down to 11.2.  EGD revealed tortuous esophagus, mild reflux esophagitis with no bleeding, focus of nodularity at the GE junction (biopsied) gastritis with no bleeding (biopsied), duodenal ulcer with red spot (source of recent upper GI bleed) that was clipped.  GI recommended ADAT and PPI twice daily for 8 weeks followed by daily.  H. pylori testing pending.   Subjective: Seen and examined earlier this morning.  No major events overnight or this morning.  No complaints.  H&H remained stable.  However, patient has significant orthostasis.   Objective: Vitals:   07/14/20 2306 07/15/20 0349 07/15/20 0800 07/15/20 1100  BP: 95/71 104/62 102/69 100/69  Pulse: 80 96 92 88  Resp: 17 20 19 19   Temp: 98 F (36.7 C) 97.7 F (36.5 C)  97.7 F (36.5 C)  TempSrc: Oral Oral    SpO2: 96% 92% 96% (!) 86%  Weight:      Height:        Intake/Output Summary (Last 24 hours) at  07/15/2020 1452 Last data filed at 07/15/2020 0900 Gross per 24 hour  Intake 480 ml  Output 1450 ml  Net -970 ml   Filed Weights   07/13/20 0941 07/14/20 1503  Weight: 103 kg 102.1 kg    Examination:  GENERAL: No apparent distress.  Nontoxic. HEENT: MMM.  Vision and hearing grossly intact.  NECK: Supple.  No apparent JVD.  RESP: On room air.  No IWOB.  Fair aeration bilaterally. CVS:  RRR. Heart sounds normal.  ABD/GI/GU: BS+. Abd soft, NTND.  MSK/EXT:  Moves extremities. No apparent deformity. No edema.  SKIN: no apparent skin lesion or wound NEURO: Awake, alert and oriented appropriately.  No apparent focal neuro deficit. PSYCH: Calm. Normal affect.   Procedures:  10/13-EGD revealed tortuous esophagus, mild reflux esophagitis with no bleeding, focus of nodularity at the GE junction (biopsied) gastritis with no bleeding (biopsied), duodenal ulcer with red spot (source of recent upper GI bleed) that was clipped.  H. pylori testing pending.  Microbiology summarized: COVID-19 PCR negative. Influenza PCR negative.  Assessment & Plan: Symptomatic anemia due to upper GI bleed: B/l Hgb 16-17> 14.2 (on 10/11)> 12.6 (admit)> 11.2>10.4.  Duodenal ulcer/esophagitis/gastritis -EGD as above.  Biopsies and H. pylori testing pending. -Tolerating soft diet. -Continue IV PPI, followed by p.o. PPI twice daily for 8 weeks, then once daily -Avoid NSAIDs. -Monitor H&H -SCD for VTE  prophylaxis  Fall at home/dizziness/generalized weakness/orthostatic hypotension: BP dropped from 114/74 saline to 88/62 standing at 3-minute. HR up from 120-155 -Decrease metoprolol to 50 mg twice daily -TED hose-thigh high -Recheck orthostatic vitals in the morning -Gentle IV fluid -PT/OT eval  Persistent A. fib with mild RVR: On Lopressor for rate control.  Reportedly took himself off warfarin after talking to his cardiologist, Dr. Claiborne Billings and restarted aspirin.  Currently rate controlled on home  metoprolol. -Continue home metoprolol -Discontinue aspirin  Hypotension in patient with history of hypertension -Continue holding HCTZ.  -Reduce metoprolol to 50 mg twice daily.  Hyperlipidemia -Continue home statin  Hyperglycemia without diagnosis of diabetes -Check hemoglobin A1c  Leukocytosis: WBC 17.6. -Based on his advanced age, treatment for this issue appears to be unnecessary either acutely or long-term  Hematuria: Large Hgb on UA.  Patient has no UTI symptoms. -Recommend repeat UA in 2 to 3 weeks  Thrombocytopenia: Platelet dropped to 142. -Recheck in the morning  Note: This patient has been tested and is negative for the novel coronavirus COVID-19. The patient has been fully vaccinated against COVID-19.  Class I obesity Body mass index is 30.52 kg/m.         DVT prophylaxis:  Place TED hose Start: 07/15/20 1443 SCDs Start: 07/13/20 1502  Code Status: DNR/DNI Family Communication: Updated patient's son at bedside on 10/13. Status is: Inpatient  Remains inpatient appropriate because:Hemodynamically unstable and Inpatient level of care appropriate due to severity of illness   Dispo: The patient is from: Home              Anticipated d/c is to: To be determined              Anticipated d/c date is: 1 day              Patient currently is not medically stable to d/c.       Consultants:  Gastroenterology   Sch Meds:  Scheduled Meds:  metoprolol tartrate  50 mg Oral BID   pantoprazole  40 mg Oral BID   Continuous Infusions:  PRN Meds:.acetaminophen **OR** acetaminophen, ondansetron **OR** ondansetron (ZOFRAN) IV  Antimicrobials: Anti-infectives (From admission, onward)   None       I have personally reviewed the following labs and images: CBC: Recent Labs  Lab 07/13/20 1217 07/13/20 1217 07/13/20 1222 07/13/20 1740 07/14/20 0234 07/14/20 1659 07/15/20 0658  WBC 17.6*  --   --  15.7* 12.9* 14.1* 9.5  HGB 12.6*   < > 11.6*  11.0* 11.2* 10.8* 10.4*  HCT 38.4*   < > 34.0* 34.4* 34.6* 32.2* 30.7*  MCV 98.7  --   --  98.6 99.7 97.9 97.2  PLT 192  --   --  181 174 163 142*   < > = values in this interval not displayed.   BMP &GFR Recent Labs  Lab 07/12/20 2050 07/13/20 0956 07/13/20 1222 07/14/20 0234  NA 138 139 140 139  K 3.9 4.9 4.0 4.0  CL 111 111 109 108  CO2 17* 16*  --  23  GLUCOSE 170* 164* 144* 103*  BUN 51* 63* 57* 38*  CREATININE 1.05 1.03 0.80 0.92  CALCIUM 9.0 8.6*  --  8.4*   Estimated Creatinine Clearance: 66 mL/min (by C-G formula based on SCr of 0.92 mg/dL). Liver & Pancreas: Recent Labs  Lab 07/13/20 0956  AST 19  ALT 11  ALKPHOS 55  BILITOT 0.8  PROT 5.2*  ALBUMIN 3.1*   Recent  Labs  Lab 07/13/20 1217  LIPASE 27   No results for input(s): AMMONIA in the last 168 hours. Diabetic: No results for input(s): HGBA1C in the last 72 hours. No results for input(s): GLUCAP in the last 168 hours. Cardiac Enzymes: No results for input(s): CKTOTAL, CKMB, CKMBINDEX, TROPONINI in the last 168 hours. No results for input(s): PROBNP in the last 8760 hours. Coagulation Profile: No results for input(s): INR, PROTIME in the last 168 hours. Thyroid Function Tests: No results for input(s): TSH, T4TOTAL, FREET4, T3FREE, THYROIDAB in the last 72 hours. Lipid Profile: No results for input(s): CHOL, HDL, LDLCALC, TRIG, CHOLHDL, LDLDIRECT in the last 72 hours. Anemia Panel: No results for input(s): VITAMINB12, FOLATE, FERRITIN, TIBC, IRON, RETICCTPCT in the last 72 hours. Urine analysis:    Component Value Date/Time   COLORURINE PINK (A) 07/13/2020 0205   APPEARANCEUR CLOUDY (A) 07/13/2020 0205   LABSPEC 1.021 07/13/2020 0205   PHURINE 5.0 07/13/2020 0205   GLUCOSEU NEGATIVE 07/13/2020 0205   HGBUR LARGE (A) 07/13/2020 0205   BILIRUBINUR NEGATIVE 07/13/2020 0205   KETONESUR 5 (A) 07/13/2020 0205   PROTEINUR 100 (A) 07/13/2020 0205   UROBILINOGEN 0.2 05/27/2013 0029   NITRITE  NEGATIVE 07/13/2020 0205   LEUKOCYTESUR NEGATIVE 07/13/2020 0205   Sepsis Labs: Invalid input(s): PROCALCITONIN, [place]  Microbiology: Recent Results (from the past 240 hour(s))  Respiratory Panel by RT PCR (Flu A&B, Covid) - Nasopharyngeal Swab     Status: None   Collection Time: 07/13/20  2:49 PM   Specimen: Nasopharyngeal Swab  Result Value Ref Range Status   SARS Coronavirus 2 by RT PCR NEGATIVE NEGATIVE Final    Comment: (NOTE) SARS-CoV-2 target nucleic acids are NOT DETECTED.  The SARS-CoV-2 RNA is generally detectable in upper respiratoy specimens during the acute phase of infection. The lowest concentration of SARS-CoV-2 viral copies this assay can detect is 131 copies/mL. A negative result does not preclude SARS-Cov-2 infection and should not be used as the sole basis for treatment or other patient management decisions. A negative result may occur with  improper specimen collection/handling, submission of specimen other than nasopharyngeal swab, presence of viral mutation(s) within the areas targeted by this assay, and inadequate number of viral copies (<131 copies/mL). A negative result must be combined with clinical observations, patient history, and epidemiological information. The expected result is Negative.  Fact Sheet for Patients:  PinkCheek.be  Fact Sheet for Healthcare Providers:  GravelBags.it  This test is no t yet approved or cleared by the [place] FDA and  has been authorized for detection and/or diagnosis of SARS-CoV-2 by FDA under an Emergency Use Authorization (EUA). This EUA will remain  in effect (meaning this test can be used) for the duration of the COVID-19 declaration under Section 564(b)(1) of the Act, 21 U.S.C. section 360bbb-3(b)(1), unless the authorization is terminated or revoked sooner.     Influenza A by PCR NEGATIVE NEGATIVE Final   Influenza B by PCR NEGATIVE  NEGATIVE Final    Comment: (NOTE) The Xpert Xpress SARS-CoV-2/FLU/RSV assay is intended as an aid in  the diagnosis of influenza from Nasopharyngeal swab specimens and  should not be used as a sole basis for treatment. Nasal washings and  aspirates are unacceptable for Xpert Xpress SARS-CoV-2/FLU/RSV  testing.  Fact Sheet for Patients: PinkCheek.be  Fact Sheet for Healthcare Providers: GravelBags.it  This test is not yet approved or cleared by the [place] FDA and  has been authorized for detection and/or diagnosis of SARS-CoV-2 by  FDA under an Emergency Use Authorization (EUA). This EUA will remain  in effect (meaning this test can be used) for the duration of the  Covid-19 declaration under Section 564(b)(1) of the Act, 21  U.S.C. section 360bbb-3(b)(1), unless the authorization is  terminated or revoked. Performed at [facility], [place] [address] [place], [place] [address]   MRSA PCR Screening     Status: None   Collection Time: 07/14/20  3:08 PM   Specimen: Nasal Mucosa; Nasopharyngeal  Result Value Ref Range Status   MRSA by PCR NEGATIVE NEGATIVE Final    Comment:        The GeneXpert MRSA Assay (FDA approved for NASAL specimens only), is one component of a comprehensive MRSA colonization surveillance program. It is not intended to diagnose MRSA infection nor to guide or monitor treatment for MRSA infections. Performed at [facility], [place] [address], [place], [place] [address]     Radiology Studies: No results found.   Nance Mccombs T. [place]  If 7PM-7AM, please contact night-coverage [email or web address] 07/15/2020, 2:52 PM

## 2020-07-15 NOTE — Plan of Care (Signed)
  Problem: Education: Goal: Knowledge of General Education information will improve Description: Including pain rating scale, medication(s)/side effects and non-pharmacologic comfort measures Outcome: Progressing   Problem: Clinical Measurements: Goal: Diagnostic test results will improve Outcome: Progressing   Problem: Clinical Measurements: Goal: Cardiovascular complication will be avoided Outcome: Progressing   Problem: Nutrition: Goal: Adequate nutrition will be maintained Outcome: Progressing   Problem: Pain Managment: Goal: General experience of comfort will improve Outcome: Progressing   Problem: Skin Integrity: Goal: Risk for impaired skin integrity will decrease Outcome: Progressing

## 2020-07-15 NOTE — Evaluation (Signed)
Occupational Therapy Evaluation Patient Details Name: Glen [name] MRN: [id number] DOB: [birth year] Today's Date: 07/15/2020    History of Present Illness Pt adm with symptomatic anemia due to GI bleed. PMH - prostate CA, afib, HTN.   Clinical Impression   Patient presents with the diagnosis above.  Patient and son agree he is more like himself.  He is presenting with unsteadiness on his feet and an elevated HR with exertion; otherwise, he is at or near his stated baseline for ADL and basic mobility in the room.  At home he is fairly sedentary, but capable of performing his own ADL, preparing his meds, assisting with meal prep and light home management.  He no longer drives, and family assists with community mobility.  No further OT needs in the acute setting.  HH OT can be considered if [facility] PT sees a need.  Recommended continued mobility with staff in and out of the room.        Follow Up Recommendations  No OT follow up    Equipment Recommendations  Tub/shower seat    Recommendations for Other Services       Precautions / Restrictions Precautions Precautions: Fall Restrictions Weight Bearing Restrictions: No      Mobility Bed Mobility Overal bed mobility: Modified Independent             General bed mobility comments: use of siderails  Transfers Overall transfer level: Needs assistance Equipment used: Rolling walker (2 wheeled) Transfers: Sit to/from [employer] Transfers Sit to Stand: Supervision Stand pivot transfers: Supervision       General transfer comment: Requested nursing to remove cath so patient can mobilize to BR with supervision    Balance   Sitting-balance support: No upper extremity supported;Feet supported Sitting balance-Leahy Scale: Normal     Standing balance support: Bilateral upper extremity supported Standing balance-Leahy Scale: Fair                             ADL either performed or assessed with clinical  judgement   ADL Overall ADL's : At baseline                                       General ADL Comments: Patient is able to complete dressing at sit/stand level.  Requesting nursing to remove cath so patient can mobilize to [facility] with RW and supervision.     Vision Baseline Vision/History: Wears glasses Wears Glasses: At all times Patient Visual Report: No change from baseline       Perception     Praxis      Pertinent Vitals/Pain Pain Assessment: No/denies pain     Hand Dominance Right   Extremity/Trunk Assessment Upper Extremity Assessment Upper Extremity Assessment: Overall WFL for tasks assessed   Lower Extremity Assessment Lower Extremity Assessment: Defer to PT evaluation   Cervical / Trunk Assessment Cervical / Trunk Assessment: Normal   Communication Communication Communication: HOH   Cognition Arousal/Alertness: Awake/alert Behavior During Therapy: WFL for tasks assessed/performed Overall Cognitive Status: Within Functional Limits for tasks assessed                                     General Comments  HR to 110 with limited mobility in the room at RW level.  Home Living Family/patient expects to be discharged to:: Private residence Living Arrangements: Spouse/significant other Available Help at Discharge: Family;Available 24 hours/day Type of Home: House Home Access: Stairs to enter [employer] of Steps: 2-3 Entrance Stairs-Rails: Right;Left Home Layout: Two level;Able to live on main level with bedroom/bathroom     Bathroom Shower/Tub: Occupational psychologist: Handicapped height Bathroom Accessibility: Yes How Accessible: Accessible via walker Home Equipment: [place] - 2 wheels;Wheelchair - manual          Prior Functioning/Environment Level of Independence: Independent with assistive device(s)        Comments: Uses cane inside and rollator outside        OT Problem  List: Impaired balance (sitting and/or standing)      OT Treatment/Interventions:      OT Goals(Current goals can be found in the care plan section) Acute Rehab OT Goals Patient Stated Goal: To use the bathroom. OT Goal Formulation: With patient Time For Goal Achievement: 07/19/20 Potential to Achieve Goals: Good  OT Frequency:     Barriers to D/C:  none noted          Co-evaluation              AM-PAC OT "6 Clicks" Daily Activity     Outcome Measure Help from another person eating meals?: None Help from another person taking care of personal grooming?: None Help from another person toileting, which includes using toliet, bedpan, or urinal?: A Little Help from another person bathing (including washing, rinsing, drying)?: A Little Help from another person to put on and taking off regular upper body clothing?: None Help from another person to put on and taking off regular lower body clothing?: A Little 6 Click Score: 21   End of Session Equipment Utilized During Treatment: Gait belt;Rolling walker Nurse Communication: Other (comment) (consider removing cath)  Activity Tolerance: Patient tolerated treatment well Patient left: in bed;with call bell/phone within reach;with family/visitor present  OT Visit Diagnosis: Unsteadiness on feet (R26.81)                Time: 6761-9509 OT Time Calculation (min): 23 min Charges:  OT General Charges $OT Visit: 1 Visit OT Evaluation $OT Eval Moderate Complexity: 1 Mod  07/15/2020  Rich, OTR/L  Acute Rehabilitation Services  Office:  [phone]   Metta Clines 07/15/2020, 3:01 PM

## 2020-07-15 NOTE — Evaluation (Signed)
Physical Therapy Evaluation Patient Details Name: Glen [name] MRN: [id number] DOB: April 01, 1928 Today's Date: 07/15/2020   History of Present Illness  Pt adm with symptomatic anemia due to GI bleed. PMH - prostate CA, afib, HTN.  Clinical Impression  Pt presents to PT with fairly good gait but with very high HR (160) with mobility and some asymptomatic orthostasis. Recommend return home when medically ready.  Orthostatic BPs  Sitting 114/74 HR 120  Standing 72/50 HR 155  Standing after 3 min 88/62 HR 155       Follow Up Recommendations Home health PT;Supervision - Intermittent    Equipment Recommendations  Other (comment) (rollator)    Recommendations for Other Services       Precautions / Restrictions Precautions Precautions: Fall;Other (comment) Precaution Comments: watch HR and BP Restrictions Weight Bearing Restrictions: No      Mobility  Bed Mobility               General bed mobility comments: Pt up in chair  Transfers Overall transfer level: Needs assistance Equipment used: 4-wheeled walker Transfers: Sit to/from Stand Sit to Stand: Supervision         General transfer comment: Assist for safety and lines  Ambulation/Gait Ambulation/Gait assistance: Supervision Gait Distance (Feet): 150 Feet Assistive device: 4-wheeled walker Gait Pattern/deviations: Step-through pattern;Decreased stride length Gait velocity: decr Gait velocity interpretation: <1.8 ft/sec, indicate of risk for recurrent falls General Gait Details: supervision for lines/safety. No loss of balance using rollator  Stairs            Wheelchair Mobility    Modified Rankin (Stroke Patients Only)       Balance Overall balance assessment: Needs assistance Sitting-balance support: No upper extremity supported;Feet supported Sitting balance-Leahy Scale: Good     Standing balance support: No upper extremity supported Standing balance-Leahy Scale: Fair                                Pertinent Vitals/Pain Pain Assessment: No/denies pain    Home Living Family/patient expects to be discharged to:: Private residence Living Arrangements: Spouse/significant other Available Help at Discharge: Family;Available 24 hours/day (wife uses rollator) Type of Home: House Home Access: Stairs to enter Entrance Stairs-Rails: Psychiatric nurse of Steps: 2-3 Home Layout: Two level;Able to live on main level with bedroom/bathroom Home Equipment: Gilford Rile - 2 wheels;Wheelchair - manual Additional Comments: Uses wife's rollator outside    Prior Function Level of Independence: Independent with assistive device(s)         Comments: Uses cane inside and rollator outside     Hand Dominance        Extremity/Trunk Assessment   Upper Extremity Assessment Upper Extremity Assessment: Defer to OT evaluation    Lower Extremity Assessment Lower Extremity Assessment: Generalized weakness       Communication   Communication: HOH  Cognition Arousal/Alertness: Awake/alert Behavior During Therapy: WFL for tasks assessed/performed Overall Cognitive Status: Within Functional Limits for tasks assessed                                        General Comments General comments (skin integrity, edema, etc.): HR to 150's with activity.    Exercises     Assessment/Plan    PT Assessment Patient needs continued PT services  PT Problem List Decreased strength;Decreased balance;Decreased mobility;Cardiopulmonary status limiting activity  PT Treatment Interventions DME instruction;Gait training;Functional mobility training;Therapeutic activities;Therapeutic exercise;Balance training;Patient/family education    PT Goals (Current goals can be found in the Care Plan section)  Acute Rehab PT Goals Patient Stated Goal: return home PT Goal Formulation: With patient/family Time For Goal Achievement: 07/22/20 Potential to  Achieve Goals: Good    Frequency Min 3X/week   Barriers to discharge Decreased caregiver support wife elderly    Co-evaluation               AM-PAC PT "6 Clicks" Mobility  Outcome Measure Help needed turning from your back to your side while in a flat bed without using bedrails?: A Little Help needed moving from lying on your back to sitting on the side of a flat bed without using bedrails?: A Little Help needed moving to and from a bed to a chair (including a wheelchair)?: A Little Help needed standing up from a chair using your arms (e.g., wheelchair or bedside chair)?: A Little Help needed to walk in hospital room?: A Little Help needed climbing 3-5 steps with a railing? : A Little 6 Click Score: 18    End of Session Equipment Utilized During Treatment: Gait belt Activity Tolerance: Treatment limited secondary to medical complications (Comment) (high HR) Patient left: in chair;with call bell/phone within reach;with family/visitor present Nurse Communication: Mobility status;Other (comment) (vitals) PT Visit Diagnosis: Other abnormalities of gait and mobility (R26.89)    Time: 1015-1040 PT Time Calculation (min) (ACUTE ONLY): 25 min   Charges:   PT Evaluation $PT Eval Moderate Complexity: 1 Mod PT Treatments $Gait Training: 8-22 mins        [place] Pager [phone] Office [place] 07/15/2020, 11:23 AM

## 2020-07-15 NOTE — Progress Notes (Signed)
Progress Note   Subjective  Feeling well No pain Tolerating soft diet without nausea or vomiting   Objective  Vital signs in last 24 hours: Temp:  [97.5 F (36.4 C)-98.2 F (36.8 C)] 97.7 F (36.5 C) (10/14 1100) Pulse Rate:  [80-96] 88 (10/14 1100) Resp:  [16-25] 19 (10/14 1100) BP: (94-109)/(54-71) 100/69 (10/14 1100) SpO2:  [86 %-100 %] 86 % (10/14 1100) Weight:  [102.1 kg] 102.1 kg (10/13 1503) Last BM Date: 07/13/20  Gen: awake, alert, NAD HEENT: anicteric CV: RRR, no mrg Pulm: CTA b/l Abd: soft, NT/ND, +BS throughout Ext: no c/c, trace pretibial edema Neuro: nonfocal   Intake/Output from previous day: 10/13 0701 - 10/14 0700 In: 1480 [P.O.:480; I.V.:1000] Out: 550 [Urine:550] Intake/Output this shift: Total I/O In: -  Out: 900 [Urine:900]  Lab Results: Recent Labs    07/14/20 0234 07/14/20 1659 07/15/20 0658  WBC 12.9* 14.1* 9.5  HGB 11.2* 10.8* 10.4*  HCT 34.6* 32.2* 30.7*  PLT 174 163 142*   BMET Recent Labs    07/12/20 2050 07/12/20 2050 07/13/20 0956 07/13/20 1222 07/14/20 0234  NA 138   < > 139 140 139  K 3.9   < > 4.9 4.0 4.0  CL 111   < > 111 109 108  CO2 17*  --  16*  --  23  GLUCOSE 170*   < > 164* 144* 103*  BUN 51*   < > 63* 57* 38*  CREATININE 1.05   < > 1.03 0.80 0.92  CALCIUM 9.0  --  8.6*  --  8.4*   < > = values in this interval not displayed.   LFT Recent Labs    07/13/20 0956  PROT 5.2*  ALBUMIN 3.1*  AST 19  ALT 11  ALKPHOS 55  BILITOT 0.8   PT/INR No results for input(s): LABPROT, INR in the last 72 hours. Hepatitis Panel No results for input(s): HEPBSAG, HCVAB, HEPAIGM, HEPBIGM in the last 72 hours.  Studies/Results: No results found.    Assessment & Recommendations  86 year old male presenting with coffee-ground emesis and melena found to have duodenal ulcer with red spot clipped x1, gastritis now known to be H. pylori, mild esophagitis found to be Barrett's esophagus without dysplasia  1.   Duodenal ulcer with hemorrhage and active H. pylori--hemoglobin stable after endoscopic therapy yesterday.  Will change to p.o. PPI twice daily x8 weeks then once daily thereafter likely indefinitely, see #2.  This is H. pylori associated and I will recommend therapy as below.  This can start after discharge.  I would recommend that he have a CBC 7 to 10 days after discharge which can be either with primary care or at [facility]. --Changed to p.o. twice daily before meals PPI x8 weeks --Daily PPI after 8 weeks --H. pylori therapy on discharge as follows: Bismuth 262 mg - 2 tablets QID x 14 days, Doxycycline 100 mg BID x 14 days, Metronidazole 250 mg BID x 14 days --Trend Hgb until discharge, perhaps home tomorrow if Hgb stable  2.  Barrett's esophagus --we discussed Barrett's esophagus without dysplasia, this is short segment and located at the GE junction only.  He does not have significant heartburn but I have recommended after completing ulcer therapy as above that he remain on daily PPI indefinitely.  Given his age, very short segment nature of the Barrett's esophagus and lack of dysplasia, surveillance endoscopy is not felt needed nor beneficial.  --PPI daily after ulcer therapy as  above  GI will sign off but be available, please call with questions        LOS: 2 days   Glen [name]  07/15/2020, 2:11 PM

## 2020-07-15 NOTE — Telephone Encounter (Signed)
Thank you! Agree with no aspirin or anticoagulants given ulcers, we will discuss at follow up

## 2020-07-15 NOTE — TOC Transition Note (Signed)
Transition of Care [facility]) - CM/SW Discharge Note   Patient Details  Name: Glen [name] MRN: [id number] Date of Birth: [birth year]  [facility]) CM/SW Contact:  Zenon Mayo, RN Phone Number: 07/15/2020, 2:33 PM   Clinical Narrative:    NCM offered choice, he does not have a preference for HHPT, NCM made referral to [facility] with amedysis, she is able to take referral .  Soc will begin 24 to 48hrs post dc.  Patient also needs rollator per pt eval.  NCM made referral to [facility] with [employer] for rollator, this will be delivered to patient's room.    Final next level of care: [place] Barriers to Discharge: No Barriers Identified   Patient Goals and CMS Choice Patient states their goals for this hospitalization and ongoing recovery are:: get better CMS Medicare.gov Compare Post Acute Care list provided to:: Patient Choice offered to / list presented to : Patient  Discharge Placement                       Discharge Plan and Services                DME Arranged: Walker rolling with seat DME Agency: [employer] Date DME Agency Contacted: 07/15/20 Time DME Agency Contacted: [phone] Representative spoke with at DME Agency: Juliann Pulse HH Arranged: PT [place]: [place] Date [place]: 07/15/20 Time [place]: [place] Representative spoke with at [place]: [place] Determinants of Health ([place]) Interventions     Readmission Risk Interventions No flowsheet data found.

## 2020-07-16 DIAGNOSIS — D649 Anemia, unspecified: Secondary | ICD-10-CM | POA: Insufficient documentation

## 2020-07-16 LAB — CBC
HCT: 29.1 % — ABNORMAL LOW (ref 39.0–52.0)
Hemoglobin: 9.5 g/dL — ABNORMAL LOW (ref 13.0–17.0)
MCH: 31.6 pg (ref 26.0–34.0)
MCHC: 32.6 g/dL (ref 30.0–36.0)
MCV: 96.7 fL (ref 80.0–100.0)
Platelets: 157 10*3/uL (ref 150–400)
RBC: 3.01 MIL/uL — ABNORMAL LOW (ref 4.22–5.81)
RDW: 13.5 % (ref 11.5–15.5)
WBC: 7.7 10*3/uL (ref 4.0–10.5)
nRBC: 0 % (ref 0.0–0.2)

## 2020-07-16 LAB — HEMOGLOBIN A1C
Hgb A1c MFr Bld: 6 % — ABNORMAL HIGH (ref 4.8–5.6)
Mean Plasma Glucose: 126 mg/dL

## 2020-07-16 NOTE — Telephone Encounter (Signed)
Daughter in law updated and verbalized understanding.

## 2020-07-16 NOTE — Progress Notes (Signed)
Physical Therapy Treatment Patient Details Name: Glen [name] MRN: [id number] DOB: 05-Aug-1928 Today's Date: 07/16/2020    History of Present Illness Pt adm with symptomatic anemia due to GI bleed. PMH - prostate CA, afib, HTN.    PT Comments    Pt with improved activity tolerance. HR to 130 with ambulation today which is improved over yesterday's rate. Will continue to follow. Should be good to return home from PT standpoint when medically ready.    Follow Up Recommendations  Home health PT;Supervision - Intermittent     Equipment Recommendations  Other (comment) (rollator)    Recommendations for Other Services       Precautions / Restrictions Precautions Precautions: Fall;Other (comment) Precaution Comments: watch HR and BP    Mobility  Bed Mobility               General bed mobility comments: Pt up in chair  Transfers Overall transfer level: Needs assistance Equipment used: 4-wheeled walker Transfers: Sit to/from Stand Sit to Stand: Supervision         General transfer comment: Assist for safety and lines  Ambulation/Gait Ambulation/Gait assistance: Supervision Gait Distance (Feet): 225 Feet Assistive device: 4-wheeled walker Gait Pattern/deviations: Step-through pattern;Decreased stride length Gait velocity: decr Gait velocity interpretation: 1.31 - 2.62 ft/sec, indicative of limited community ambulator General Gait Details: supervision for lines/safety. No loss of balance using rollator   Stairs             Wheelchair Mobility    Modified Rankin (Stroke Patients Only)       Balance Overall balance assessment: Needs assistance Sitting-balance support: No upper extremity supported;Feet supported Sitting balance-Leahy Scale: Good     Standing balance support: No upper extremity supported Standing balance-Leahy Scale: Fair                              Cognition Arousal/Alertness: Awake/alert Behavior During Therapy:  WFL for tasks assessed/performed Overall Cognitive Status: Within Functional Limits for tasks assessed                                        Exercises      General Comments General comments (skin integrity, edema, etc.): HR to 130 with amb       Pertinent Vitals/Pain      Home Living                      Prior Function            PT Goals (current goals can now be found in the care plan section) Acute Rehab PT Goals Patient Stated Goal: return home Progress towards PT goals: Progressing toward goals    Frequency    Min 3X/week      PT Plan Current plan remains appropriate    Co-evaluation              AM-PAC PT "6 Clicks" Mobility   Outcome Measure  Help needed turning from your back to your side while in a flat bed without using bedrails?: A Little Help needed moving from lying on your back to sitting on the side of a flat bed without using bedrails?: A Little Help needed moving to and from a bed to a chair (including a wheelchair)?: A Little Help needed standing up from a chair using your arms (  e.g., wheelchair or bedside chair)?: A Little Help needed to walk in hospital room?: A Little Help needed climbing 3-5 steps with a railing? : A Little 6 Click Score: 18    End of Session   Activity Tolerance: Patient tolerated treatment well Patient left: in chair;with call bell/phone within reach;with family/visitor present Nurse Communication: Mobility status PT Visit Diagnosis: Other abnormalities of gait and mobility (R26.89)     Time: 1130-1145 PT Time Calculation (min) (ACUTE ONLY): 15 min  Charges:  $Gait Training: 8-22 mins                     [place] Pager [phone] Office [place] 07/16/2020, 11:49 AM

## 2020-07-16 NOTE — Progress Notes (Addendum)
PROGRESS NOTE  Glen [name]:[id number] DOB: December 02, 1927   PCP: [name], MD  Patient is from: Home - lives with wife; uses cane and walker at baseline.  NOK: Wife or son, [phone]  DOA: 07/13/2020 LOS: 3  Chief complaints: Coffee-ground emesis, dark stool, dizziness and fall  Brief Narrative / Interim history: 86 year old male with history of prostate cancer, A. fib not on anticoagulation, HTN, HLD and obesity presenting with hematemesis, melena, dizziness and fall, and admitted for symptomatic anemia due to GI bleed.   In ED, HR 114.  BP 89/66 but improved.  98% on room air.  WBC 17.6.  Hgb 12.6.  Hemoccult positive x2.  CO2 16. Cr 1.03.  BUN 63.  Troponin 5.  Twelve-lead EKG with A. fib at 109 and abnormal R wave progression.  COVID-19 and influenza PCR negative.  CT head without contrast without acute finding.  UA with large Hgb, 5 ketones and 100 protein.  CXR and right ankle x-ray without acute finding.  GI consulted, and patient was admitted for possible upper GI bleed.   The next day, hemoglobin down to 11.2.  EGD revealed tortuous esophagus, mild reflux esophagitis with no bleeding, focus of nodularity at the GE junction (biopsied),  gastritis with no bleeding (biopsied), duodenal ulcer with red spot (source of recent upper GI bleed) that was clipped.  GI recommended ADAT and PPI twice daily for 8 weeks followed by daily.  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  Biopsy from gastritis positive for H. pylori.   Subjective: Seen and examined earlier this morning.  No major events overnight of this morning.  He has not had bowel movement recently.  Denies chest pain, dyspnea, dizziness or GI symptoms.  He had positive orthostatic vitals yesterday.  Metoprolol reduced to 50 mg twice daily.  HR ranges from 90s to 110.   Objective: Vitals:   07/15/20 2010 07/15/20 2310 07/16/20 0400 07/16/20 0806  BP: (!) 100/59 102/65 116/89 98/68  Pulse: 100 93  100 (!) 110  Resp: 20 20 (!) 21 17  Temp: 98.2 F (36.8 C)  98.2 F (36.8 C) 97.7 F (36.5 C)  TempSrc: Oral Oral Oral Oral  SpO2: 96% 91% 95% 96%  Weight:      Height:        Intake/Output Summary (Last 24 hours) at 07/16/2020 1134 Last data filed at 07/16/2020 0850 Gross per 24 hour  Intake 600 ml  Output 1000 ml  Net -400 ml   Filed Weights   07/13/20 0941 07/14/20 1503  Weight: 103 kg 102.1 kg    Examination:  GENERAL: No apparent distress.  Nontoxic. HEENT: MMM.  Vision and hearing grossly intact.  NECK: Supple.  No apparent JVD.  RESP: On room air.  No IWOB.  Fair aeration bilaterally. CVS:  RRR.  HR in 80s on monitor.  Heart sounds normal.  ABD/GI/GU: BS+. Abd soft, NTND.  MSK/EXT:  Moves extremities. No apparent deformity. No edema.  SKIN: no apparent skin lesion or wound NEURO: Awake, alert and oriented appropriately.  No apparent focal neuro deficit. PSYCH: Calm. Normal affect.  Procedures:  10/13-EGD revealed tortuous esophagus, mild reflux esophagitis with no bleeding, focus of nodularity at the GE junction (biopsied) gastritis with no bleeding (biopsied), duodenal ulcer with red spot (source of recent upper GI bleed) that was clipped.  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  Biopsy from gastritis positive for H. pylori.  Microbiology summarized: COVID-19 PCR negative.  Influenza PCR negative.  Assessment & Plan: Symptomatic acute blood loss anemia due to upper GI bleed: B/l Hgb 16-17> 14.2 (on 10/11)> 12.6 (admit)> 11.2>> 9.5.  Duodenal ulcer/Barrett's esophagitis/gastritis H. pylori infection -EGD and biopsy results as above. -Continue IV PPI, followed by p.o. PPI twice daily for 8 weeks, then once daily -Will discuss H. pylori treatment with GI -Avoid NSAIDs. -Monitor H&H -SCD for VTE prophylaxis   Fall at home/dizziness/generalized weakness/orthostatic hypotension: On 10/14, BP 114/74 sitting>>88/62 standing at 3-minutes.  HR up from 120-155 -Decreased metoprolol to 50 mg twice daily -TED hose-thigh high-requested RN to apply this -Recheck orthostatic vitals with status on -PT/OT eval   Persistent A. fib with mild RVR: On Lopressor for rate control.  Reportedly took himself off warfarin after talking to his cardiologist, Dr. Claiborne Billings and restarted aspirin.  Currently rate controlled on home metoprolol. -Adjusted metoprolol as above.  -May have to run by cardiology to see if amiodarone is an option given his orthostatic hypotension -Discontinued aspirin.  Patient has upcoming appointment with cardiologist to discuss options   Hypotension in patient with history of hypertension -Continue holding HCTZ.  -Reduced metoprolol to 50 mg twice daily on 10/14.   Hyperlipidemia -Continue home statin   Hyperglycemia without diagnosis of diabetes: A1c 6.0%.  Leukocytosis: Resolved. -Based on his advanced age, treatment for this issue appears to be unnecessary either acutely or long-term   Hematuria: Large Hgb on UA.  Patient has no UTI symptoms. -Recheck urinalysis  Thrombocytopenia: Resolved.  Platelet 157.   Note: This patient has been tested and is negative for the novel coronavirus COVID-19. The patient has been fully vaccinated against COVID-19.  Class I obesity Body mass index is 30.52 kg/m.         DVT prophylaxis:  Place TED hose Start: 07/15/20 1443 SCDs Start: 07/13/20 1502  Code Status: DNR/DNI Family Communication: Updated patient's son at bedside Status is: Inpatient  Remains inpatient appropriate because:Hemodynamically unstable and Inpatient level of care appropriate due to severity of illness   Dispo: The patient is from: Home              Anticipated d/c is to:  To be determined              Anticipated d/c date is: 1 day              Patient currently is not medically stable to d/c.       Consultants:  Gastroenterology   Sch Meds:  Scheduled Meds:  metoprolol tartrate   50 mg Oral BID   pantoprazole  40 mg Oral BID   Continuous Infusions:   PRN Meds:.acetaminophen **OR** acetaminophen, ondansetron **OR** ondansetron (ZOFRAN) IV  Antimicrobials: Anti-infectives (From admission, onward)    None        I have personally reviewed the following labs and images: CBC: Recent Labs  Lab 07/13/20 1740 07/14/20 0234 07/14/20 1659 07/15/20 0658 07/16/20 0123  WBC 15.7* 12.9* 14.1* 9.5 7.7  HGB 11.0* 11.2* 10.8* 10.4* 9.5*  HCT 34.4* 34.6* 32.2* 30.7* 29.1*  MCV 98.6 99.7 97.9 97.2 96.7  PLT 181 174 163 142* 157   BMP &GFR Recent Labs  Lab 07/12/20 2050 07/13/20 0956 07/13/20 1222 07/14/20 0234  NA 138 139 140 139  K 3.9 4.9 4.0 4.0  CL 111 111 109 108  CO2 17* 16*  --  23  GLUCOSE 170* 164* 144* 103*  BUN 51* 63* 57* 38*  CREATININE 1.05 1.03 0.80 0.92  CALCIUM 9.0 8.6*  --  8.4*   Estimated Creatinine Clearance: 66 mL/min (by C-G formula based on SCr of 0.92 mg/dL). Liver & Pancreas: Recent Labs  Lab 07/13/20 0956  AST 19  ALT 11  ALKPHOS 55  BILITOT 0.8  PROT 5.2*  ALBUMIN 3.1*   Recent Labs  Lab 07/13/20 1217  LIPASE 27   No results for input(s): AMMONIA in the last 168 hours. Diabetic: Recent Labs    07/15/20 0658  HGBA1C 6.0*   No results for input(s): GLUCAP in the last 168 hours. Cardiac Enzymes: No results for input(s): CKTOTAL, CKMB, CKMBINDEX, TROPONINI in the last 168 hours. No results for input(s): PROBNP in the last 8760 hours. Coagulation Profile: No results for input(s): INR, PROTIME in the last 168 hours. Thyroid Function Tests: No results for input(s): TSH, T4TOTAL, FREET4, T3FREE, THYROIDAB in the last 72 hours. Lipid Profile: No results for input(s): CHOL, HDL, LDLCALC, TRIG, CHOLHDL, LDLDIRECT in the last 72 hours. Anemia Panel: No results for input(s): VITAMINB12, FOLATE, FERRITIN, TIBC, IRON, RETICCTPCT in the last 72 hours. Urine analysis:    Component Value Date/Time   COLORURINE PINK  (A) 07/13/2020 0205   APPEARANCEUR CLOUDY (A) 07/13/2020 0205   LABSPEC 1.021 07/13/2020 0205   PHURINE 5.0 07/13/2020 0205   GLUCOSEU NEGATIVE 07/13/2020 0205   HGBUR LARGE (A) 07/13/2020 0205   BILIRUBINUR NEGATIVE 07/13/2020 0205   KETONESUR 5 (A) 07/13/2020 0205   PROTEINUR 100 (A) 07/13/2020 0205   UROBILINOGEN 0.2 05/27/2013 0029   NITRITE NEGATIVE 07/13/2020 0205   LEUKOCYTESUR NEGATIVE 07/13/2020 0205   Sepsis Labs: Invalid input(s): PROCALCITONIN, [place]  Microbiology: Recent Results (from the past 240 hour(s))  Respiratory Panel by RT PCR (Flu A&B, Covid) - Nasopharyngeal Swab     Status: None   Collection Time: 07/13/20  2:49 PM   Specimen: Nasopharyngeal Swab  Result Value Ref Range Status   SARS Coronavirus 2 by RT PCR NEGATIVE NEGATIVE Final    Comment: (NOTE) SARS-CoV-2 target nucleic acids are NOT DETECTED.  The SARS-CoV-2 RNA is generally detectable in upper respiratoy specimens during the acute phase of infection. The lowest concentration of SARS-CoV-2 viral copies this assay can detect is 131 copies/mL. A negative result does not preclude SARS-Cov-2 infection and should not be used as the sole basis for treatment or other patient management decisions. A negative result may occur with  improper specimen collection/handling, submission of specimen other than nasopharyngeal swab, presence of viral mutation(s) within the areas targeted by this assay, and inadequate number of viral copies (<131 copies/mL). A negative result must be combined with clinical observations, patient history, and epidemiological information. The expected result is Negative.  Fact Sheet for Patients:  PinkCheek.be  Fact Sheet for Healthcare Providers:  GravelBags.it  This test is no t yet approved or cleared by the [place] FDA and  has been authorized for detection and/or diagnosis of SARS-CoV-2 by FDA under an  Emergency Use Authorization (EUA). This EUA will remain  in effect (meaning this test can be used) for the duration of the COVID-19 declaration under Section 564(b)(1) of the Act, 21 U.S.C. section 360bbb-3(b)(1), unless the authorization is terminated or revoked sooner.     Influenza A by PCR NEGATIVE NEGATIVE Final   Influenza B by PCR NEGATIVE NEGATIVE Final    Comment: (NOTE) The Xpert Xpress SARS-CoV-2/FLU/RSV assay is intended as an aid in  the diagnosis of influenza from Nasopharyngeal swab specimens and  should not be used as a sole  basis for treatment. Nasal washings and  aspirates are unacceptable for Xpert Xpress SARS-CoV-2/FLU/RSV  testing.  Fact Sheet for Patients: PinkCheek.be  Fact Sheet for Healthcare Providers: GravelBags.it  This test is not yet approved or cleared by the [place] FDA and  has been authorized for detection and/or diagnosis of SARS-CoV-2 by  FDA under an Emergency Use Authorization (EUA). This EUA will remain  in effect (meaning this test can be used) for the duration of the  Covid-19 declaration under Section 564(b)(1) of the Act, 21  U.S.C. section 360bbb-3(b)(1), unless the authorization is  terminated or revoked. Performed at [facility], [place] [address], [place], [place] [address]   MRSA PCR Screening     Status: None   Collection Time: 07/14/20  3:08 PM   Specimen: Nasal Mucosa; Nasopharyngeal  Result Value Ref Range Status   MRSA by PCR NEGATIVE NEGATIVE Final    Comment:        The GeneXpert MRSA Assay (FDA approved for NASAL specimens only), is one component of a comprehensive MRSA colonization surveillance program. It is not intended to diagnose MRSA infection nor to guide or monitor treatment for MRSA infections. Performed at [facility], [place] [address] [place], [place] [address]     Radiology Studies: No results found.   Kayde Warehime T. [place]  If 7PM-7AM, please contact night-coverage [email or web address] 07/16/2020, 11:34 AM

## 2020-07-16 NOTE — Care Management Important Message (Signed)
Important Message  Patient Details  Name: Glen [name] MRN: [id number] Date of Birth: Nov 18, 1927   Medicare Important Message Given:  Yes     Orbie Pyo 07/16/2020, 3:29 PM

## 2020-07-17 DIAGNOSIS — I480 Paroxysmal atrial fibrillation: Secondary | ICD-10-CM

## 2020-07-17 LAB — RETICULOCYTES
Immature Retic Fract: 30.1 % — ABNORMAL HIGH (ref 2.3–15.9)
RBC.: 3.18 MIL/uL — ABNORMAL LOW (ref 4.22–5.81)
Retic Count, Absolute: 148.2 10*3/uL (ref 19.0–186.0)
Retic Ct Pct: 4.7 % — ABNORMAL HIGH (ref 0.4–3.1)

## 2020-07-17 LAB — IRON AND TIBC
Iron: 30 ug/dL — ABNORMAL LOW (ref 45–182)
Saturation Ratios: 14 % — ABNORMAL LOW (ref 17.9–39.5)
TIBC: 214 ug/dL — ABNORMAL LOW (ref 250–450)
UIBC: 184 ug/dL

## 2020-07-17 LAB — FOLATE: Folate: 9.4 ng/mL (ref >5.9)

## 2020-07-17 LAB — VITAMIN B12: Vitamin B-12: 332 pg/mL (ref 180–914)

## 2020-07-17 LAB — HEMOGLOBIN AND HEMATOCRIT, BLOOD
HCT: 28.6 % — ABNORMAL LOW (ref 39.0–52.0)
Hemoglobin: 9.6 g/dL — ABNORMAL LOW (ref 13.0–17.0)

## 2020-07-17 LAB — FERRITIN: Ferritin: 132 ng/mL (ref 24–336)

## 2020-07-17 MED ORDER — BISMUTH SUBSALICYLATE 262 MG PO TABS: 524.0000 mg | ORAL_TABLET | Freq: Four times a day (QID) | ORAL | 0 refills | Status: AC

## 2020-07-17 MED ORDER — DOXYCYCLINE HYCLATE 100 MG PO CAPS: 100.0000 mg | ORAL_CAPSULE | Freq: Two times a day (BID) | ORAL | 0 refills | Status: AC

## 2020-07-17 MED ORDER — FERROUS SULFATE 325 (65 FE) MG PO TBEC: 325.0000 mg | DELAYED_RELEASE_TABLET | Freq: Two times a day (BID) | ORAL | 1 refills | Status: DC

## 2020-07-17 MED ORDER — PANTOPRAZOLE SODIUM 40 MG PO TBEC: DELAYED_RELEASE_TABLET | ORAL | 1 refills | Status: DC

## 2020-07-17 MED ORDER — POLYETHYLENE GLYCOL 3350 17 G PO PACK
17.0000 g | PACK | Freq: Two times a day (BID) | ORAL | Status: DC | PRN
  Administered 2020-07-17: 17 g via ORAL
  Filled 2020-07-17: qty 1

## 2020-07-17 MED ORDER — METOPROLOL TARTRATE 50 MG PO TABS: 100.0000 mg | ORAL_TABLET | Freq: Two times a day (BID) | ORAL | Status: DC

## 2020-07-17 MED ORDER — POLYETHYLENE GLYCOL 3350 17 GM/SCOOP PO POWD: 17.0000 g | Freq: Two times a day (BID) | ORAL | 2 refills | Status: DC | PRN

## 2020-07-17 MED ORDER — METRONIDAZOLE 250 MG PO TABS: 250.0000 mg | ORAL_TABLET | Freq: Two times a day (BID) | ORAL | 0 refills | Status: AC

## 2020-07-17 MED ORDER — ONDANSETRON HCL 4 MG PO TABS: 4.0000 mg | ORAL_TABLET | Freq: Three times a day (TID) | ORAL | 0 refills | Status: DC | PRN

## 2020-07-17 NOTE — Progress Notes (Signed)
Pt provided with detailed verbal discharge instructions. Paper copy provided to patient. RN answered all questions. VSS at d/c. IV's removed. Rollator and patient belongings sent with patient. RN and NT d/c patient via wheelchair to [employer] entrance to private vehicle.

## 2020-07-17 NOTE — Plan of Care (Signed)
  Problem: Education: Goal: Ability to identify signs and symptoms of gastrointestinal bleeding will improve Outcome: Progressing   Problem: Bowel/Gastric: Goal: Will show no signs and symptoms of gastrointestinal bleeding Outcome: Progressing   Problem: Fluid Volume: Goal: Will show no signs and symptoms of excessive bleeding Outcome: Progressing   Problem: Clinical Measurements: Goal: Complications related to the disease process, condition or treatment will be avoided or minimized Outcome: Progressing   Problem: Education: Goal: Knowledge of disease or condition will improve Outcome: Progressing Goal: Understanding of medication regimen will improve Outcome: Progressing   Problem: Activity: Goal: Ability to tolerate increased activity will improve Outcome: Progressing   Problem: Cardiac: Goal: Ability to achieve and maintain adequate cardiopulmonary perfusion will improve Outcome: Progressing   Problem: Health Behavior/Discharge Planning: Goal: Ability to safely manage health-related needs after discharge will improve Outcome: Progressing

## 2020-07-17 NOTE — Discharge Summary (Signed)
Physician Discharge Summary  Glen [name] IRJ:[id number] DOB: [birth year] DOA: 07/13/2020  PCP: Glen Barrack, MD  Admit date: 07/13/2020 Discharge date: 07/17/2020  Admitted From: home Disposition: Home  Recommendations for Outpatient Follow-up:  1. Follow ups as below. 2. Please obtain CBC/BMP/Mag/urinalysis at follow up 3. Please follow up on the following pending results: None  Home Health: PT/OT Equipment/Devices: Rollator  Discharge Condition: Stable CODE STATUS: DNR/DNI   Follow-up Information    [facility] Follow up.   Why: rollator Contact information: [place] [address] [phone]        [facility], [facility] Follow up.   Why: HHPT Contact information: [place] [address] [phone]        Glen Barrack, MD. Schedule an appointment as soon as possible for a visit in 1 week(s).   Specialty: Family Medicine Contact information: [place] [address] [phone]        Glen Bears, MD. Schedule an appointment as soon as possible for a visit in 2 week(s).   Specialty: Gastroenterology Contact information: [address] [place] [address] [phone]               Hospital Course: 86 year old male with history of prostate cancer, A. fib not on anticoagulation, HTN, HLD and obesity presenting with hematemesis, melena, dizziness and fall, and admitted for symptomatic anemia due to GI bleed.   In ED, HR 114.  BP 89/66 but improved.  98% on room air.  WBC 17.6.  Hgb 12.6.  Hemoccult positive x2.  CO2 16. Cr 1.03.  BUN 63.  Troponin 5.  Twelve-lead EKG with A. fib at 109 and abnormal R wave progression.  COVID-19 and influenza PCR negative.  CT head without contrast without acute finding.  UA with large Hgb, 5 ketones and 100 protein.  CXR and right ankle x-ray without acute finding.  GI consulted, and patient was admitted for possible upper GI bleed.    The next day, hemoglobin down to 11.2.  EGD revealed tortuous esophagus, mild reflux esophagitis with no bleeding, focus of nodularity at the GE junction (biopsied),  gastritis with no bleeding (biopsied), duodenal ulcer with red spot (source of recent upper GI bleed) that was clipped.  GI recommended ADAT and PPI twice daily for 8 weeks followed by daily.  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  Biopsy from gastritis positive for H. Pylori.  GI recommended treatment with PPI, bismuth, doxycycline and Flagyl for 2 weeks.  Hemoglobin trended down to 9.6 and remained stable than after. Recommend repeat CBC in 1 week.  P.o. iron to start in 1 week.  In regards to generalized weakness and dizziness, orthostatic vitals were positive but \\esolved  with TED hose and adjustment of his cardiac medications.  HCTZ discontinued.  He has upcoming appointment with his cardiologist next week.  Home health PT/OT and Rollator ordered.  See individual problem list below for more on hospital course.  Discharge Diagnoses:  Symptomatic  ABLA due to upper GI bleed: B/l Hgb 16-17> 14.2 (on 10/11)> 12.6 (admit)>> 9.5> 9.6.  Duodenal ulcer/Barrett's esophagitis/gastritis H. pylori infection -EGD and biopsy results as above. -Protonix 40 mg twice daily for 8 weeks followed by daily per GI rec -Bismuth, doxycycline, Protonix and Flagyl for H. pylori infection -Aspirin discontinued.  Advised to avoid NSAIDs -Recheck CBC in 1 week  Fall at home Generalized weakness/dizziness-improved. Orthostatic hypotension: resolved. -Continue TED hose -  Discontinued HCTZ indefinitely -Home health PT/OT/Rollator  Persistent A. fib with mild RVR:  Rate controlled with metoprolol. -Continue home metoprolol -Discontinued aspirin. -Has upcoming appointment with his cardiologist in 3 days.  Hypotension in patient with history of hypertension: Resolved. -Discontinued HCTZ -Continue home  metoprolol  Hyperlipidemia -Continue home statin  Hyperglycemia without diagnosis of diabetes: A1c 6.0%.  Leukocytosis: Resolved.  Hematuria: Large Hgb on UA.  Patient has no UTI symptoms. -Recheck urinalysis in 2 to 3 weeks  Thrombocytopenia: Resolved.    Note: This patient has been tested and is negative for the novel coronavirus COVID-19. The patient has been fully vaccinated against COVID-19.  Class I obesity Body mass index is 30.52 kg/m.   Body mass index is 30.52 kg/m.            Discharge Exam: Vitals:   07/17/20 1110 07/17/20 1120  BP:    Pulse:    Resp:    Temp:  98.1 F (36.7 C)  SpO2: 93%     GENERAL: No apparent distress.  Nontoxic. HEENT: MMM.  Vision and hearing grossly intact.  NECK: Supple.  No apparent JVD.  RESP: On room air.  No IWOB.  Fair aeration bilaterally. CVS:  RRR. Heart sounds normal.  ABD/GI/GU: Bowel sounds present. Soft. Non tender.  MSK/EXT:  Moves extremities. No apparent deformity. No edema.  SKIN: no apparent skin lesion or wound NEURO: Awake, alert and oriented fairly.  No apparent focal neuro deficit. PSYCH: Calm. Normal affect.  Discharge Instructions  Discharge Instructions    Call MD for:   Complete by: As directed    Fresh red blood in his stool or dark tarry stool   Call MD for:  difficulty breathing, headache or visual disturbances   Complete by: As directed    Call MD for:  extreme fatigue   Complete by: As directed    Call MD for:  persistant dizziness or light-headedness   Complete by: As directed    Diet - low sodium heart healthy   Complete by: As directed    Discharge instructions   Complete by: As directed    It has been a pleasure taking care of you!  You were hospitalized due to gastrointestinal bleeding.  You were treated medically and surgically.  With that, your bleeding seems to have subsided.  We have stopped your aspirin.  We strongly recommend you avoid any over-the-counter pain  medication except plain Tylenol.  We are discharging you on new medications to treat H. pylori infection of your stomach that might have caused inflammation and bleeding.    You regards to your orthostatic hypotension ( drop in blood pressure when you get up), we have stopped your hydrochlorothiazide.  Continue wearing your compression socks during the day.  We also recommend a stepwise approach when you get up to go.   Please review your new medication list and the directions on your medications before you take them.  It is very important that you take your medications as prescribed.  Go to your appointment with cardiologist next week.  Follow-up with your gastroenterologist in 1 to 2 weeks.   Take care,   Increase activity slowly   Complete by: As directed      Allergies as of 07/17/2020      Reactions   Codeine Other (See Comments)   constpation      Medication List    STOP taking these medications   aspirin EC 81 MG tablet   hydrochlorothiazide 12.5 MG  capsule Commonly known as: MICROZIDE   Turmeric 500 MG Caps     TAKE these medications   acetaminophen 500 MG tablet Commonly known as: TYLENOL Take 1,000 mg by mouth every 6 (six) hours as needed for pain.   Bismuth Subsalicylate 027 MG Tabs Take 2 tablets (524 mg total) by mouth in the morning, at noon, in the evening, and at bedtime for 14 days.   doxycycline 100 MG capsule Commonly known as: VIBRAMYCIN Take 1 capsule (100 mg total) by mouth 2 (two) times daily for 14 days.   ferrous sulfate 325 (65 FE) MG EC tablet Take 1 tablet (325 mg total) by mouth 2 (two) times daily. Start taking on: July 24, 2020   metoprolol tartrate 100 MG tablet Commonly known as: LOPRESSOR Take 1 tablet (100 mg total) by mouth 2 (two) times daily.   metroNIDAZOLE 250 MG tablet Commonly known as: Flagyl Take 1 tablet (250 mg total) by mouth 2 (two) times daily for 14 days.   ondansetron 4 MG tablet Commonly known as:  Zofran Take 1 tablet (4 mg total) by mouth every 8 (eight) hours as needed for nausea or vomiting.   pantoprazole 40 MG tablet Commonly known as: PROTONIX Take 1 tablet (40 mg total) by mouth 2 (two) times daily for 60 days, THEN 1 tablet (40 mg total) daily. Start taking on: July 17, 2020   polyethylene glycol powder 17 GM/SCOOP powder Commonly known as: MiraLax Take 17 g by mouth 2 (two) times daily as needed for moderate constipation.            Durable Medical Equipment  (From admission, onward)         Start     Ordered   07/15/20 1739  For home use only DME Shower stool  Once        07/15/20 1738   07/15/20 1424  For home use only DME 4 wheeled rolling walker with seat  Once       Question:  Patient needs a walker to treat with the following condition  Answer:  Weakness   07/15/20 1424          Consultations:  Gastroenterology  Procedures/Studies:  10/13-EGD revealed tortuous esophagus, mild reflux esophagitis with no bleeding, focus of nodularity at the GE junction (biopsied) gastritis with no bleeding (biopsied), duodenal ulcer with red spot (source of recent upper GI bleed) that was clipped.  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  Biopsy from gastritis positive for H. pylori.   DG Ankle 2 Views Right  Result Date: 07/12/2020 CLINICAL DATA:  Palpitations. EXAM: RIGHT ANKLE - 2 VIEW COMPARISON:  None. FINDINGS: There is no evidence of fracture, dislocation, or joint effusion. Plantar and posterior calcaneal spurs. Well-corticated ossific density inferior to the anterior aspect of the calcaneus on lateral view likely related to old fractures. There is no evidence of severe arthropathy or aggressive appearing focal bone abnormality. Diffuse mild subcutaneus soft tissue edema. Vascular calcifications. IMPRESSION: No acute displaced fracture or dislocation of the bones of the right ankle on this two view radiograph. Electronically Signed    By: Iven Finn M.D.   On: 07/12/2020 22:58   CT Head Wo Contrast  Result Date: 07/13/2020 CLINICAL DATA:  Head trauma, minor. Additional history provided: Emesis/fall. EXAM: CT HEAD WITHOUT CONTRAST TECHNIQUE: Contiguous axial images were obtained from the base of the skull through the vertex without intravenous contrast. COMPARISON:  Head CT 11/08/2018. FINDINGS: Brain: Mild generalized cerebral  atrophy. Mild ill-defined hypoattenuation within the cerebral white matter is nonspecific, but consistent with chronic small vessel ischemic disease. There is no acute intracranial hemorrhage. No demarcated cortical infarct. No extra-axial fluid collection. No evidence of intracranial mass. No midline shift. Vascular: No hyperdense vessel.  Atherosclerotic calcifications. Skull: Normal. Negative for fracture or focal lesion. Sinuses/Orbits: Visualized orbits show no acute finding. Mild ethmoid and maxillary sinus mucosal thickening. No significant mastoid effusion. IMPRESSION: No evidence of acute intracranial abnormality. Mild generalized cerebral atrophy and chronic small vessel ischemic disease, stable as compared to the head CT of 11/08/2018. Mild ethmoid and maxillary sinus mucosal thickening. Electronically Signed   By: Kellie Simmering DO   On: 07/13/2020 13:42   DG Chest Portable 1 View  Result Date: 07/12/2020 CLINICAL DATA:  Palpitation EXAM: PORTABLE CHEST 1 VIEW COMPARISON:  Chest x-ray 11/18/2015 FINDINGS: The heart size and mediastinal contours are unchanged. Aortic arch calcification. Similar-appearing left lower lobe linear atelectasis versus more likely scarring. No focal consolidation. No pulmonary edema. No pleural effusion. No pneumothorax. No acute osseous abnormality. Multilevel degenerative changes of the spine. Degenerative changes of bilateral acromioclavicular joints. Right neck subcutaneus soft tissue asymmetry compared to the left. Vascular calcifications within the neck. IMPRESSION:  1. Right neck subcutaneus soft tissue asymmetry compared to the left. Correlate with physical exam to exclude underlying mass. Findings could be due to overlying soft tissues or something external to the patient. 2. No acute cardiopulmonary abnormality. Electronically Signed   By: Iven Finn M.D.   On: 07/12/2020 22:55        The results of significant diagnostics from this hospitalization (including imaging, microbiology, ancillary and laboratory) are listed below for reference.     Microbiology: Recent Results (from the past 240 hour(s))  Respiratory Panel by RT PCR (Flu A&B, Covid) - Nasopharyngeal Swab     Status: None   Collection Time: 07/13/20  2:49 PM   Specimen: Nasopharyngeal Swab  Result Value Ref Range Status   SARS Coronavirus 2 by RT PCR NEGATIVE NEGATIVE Final    Comment: (NOTE) SARS-CoV-2 target nucleic acids are NOT DETECTED.  The SARS-CoV-2 RNA is generally detectable in upper respiratoy specimens during the acute phase of infection. The lowest concentration of SARS-CoV-2 viral copies this assay can detect is 131 copies/mL. A negative result does not preclude SARS-Cov-2 infection and should not be used as the sole basis for treatment or other patient management decisions. A negative result may occur with  improper specimen collection/handling, submission of specimen other than nasopharyngeal swab, presence of viral mutation(s) within the areas targeted by this assay, and inadequate number of viral copies (<131 copies/mL). A negative result must be combined with clinical observations, patient history, and epidemiological information. The expected result is Negative.  Fact Sheet for Patients:  PinkCheek.be  Fact Sheet for Healthcare Providers:  GravelBags.it  This test is no t yet approved or cleared by the [place] FDA and  has been authorized for detection and/or diagnosis of SARS-CoV-2  by FDA under an Emergency Use Authorization (EUA). This EUA will remain  in effect (meaning this test can be used) for the duration of the COVID-19 declaration under Section 564(b)(1) of the Act, 21 U.S.C. section 360bbb-3(b)(1), unless the authorization is terminated or revoked sooner.     Influenza A by PCR NEGATIVE NEGATIVE Final   Influenza B by PCR NEGATIVE NEGATIVE Final    Comment: (NOTE) The Xpert Xpress SARS-CoV-2/FLU/RSV assay is intended as an aid in  the diagnosis of  influenza from Nasopharyngeal swab specimens and  should not be used as a sole basis for treatment. Nasal washings and  aspirates are unacceptable for Xpert Xpress SARS-CoV-2/FLU/RSV  testing.  Fact Sheet for Patients: PinkCheek.be  Fact Sheet for Healthcare Providers: GravelBags.it  This test is not yet approved or cleared by the [place] FDA and  has been authorized for detection and/or diagnosis of SARS-CoV-2 by  FDA under an Emergency Use Authorization (EUA). This EUA will remain  in effect (meaning this test can be used) for the duration of the  Covid-19 declaration under Section 564(b)(1) of the Act, 21  U.S.C. section 360bbb-3(b)(1), unless the authorization is  terminated or revoked. Performed at [facility], [place] [address], [place],  [address]   MRSA PCR Screening     Status: None   Collection Time: 07/14/20  3:08 PM   Specimen: Nasal Mucosa; Nasopharyngeal  Result Value Ref Range Status   MRSA by PCR NEGATIVE NEGATIVE Final    Comment:        The GeneXpert MRSA Assay (FDA approved for NASAL specimens only), is one component of a comprehensive MRSA colonization surveillance program. It is not intended to diagnose MRSA infection nor to guide or monitor treatment for MRSA infections. Performed at [facility], [place] [address], [place],  [address]      Labs: BNP (last 3 results) No results for  input(s): BNP in the last 8760 hours. Basic Metabolic Panel: Recent Labs  Lab 07/12/20 2050 07/13/20 0956 07/13/20 1222 07/14/20 0234  NA 138 139 140 139  K 3.9 4.9 4.0 4.0  CL 111 111 109 108  CO2 17* 16*  --  23  GLUCOSE 170* 164* 144* 103*  BUN 51* 63* 57* 38*  CREATININE 1.05 1.03 0.80 0.92  CALCIUM 9.0 8.6*  --  8.4*   Liver Function Tests: Recent Labs  Lab 07/13/20 0956  AST 19  ALT 11  ALKPHOS 55  BILITOT 0.8  PROT 5.2*  ALBUMIN 3.1*   Recent Labs  Lab 07/13/20 1217  LIPASE 27   No results for input(s): AMMONIA in the last 168 hours. CBC: Recent Labs  Lab 07/13/20 1740 07/13/20 1740 07/14/20 0234 07/14/20 1659 07/15/20 0658 07/16/20 0123 07/17/20 0045  WBC 15.7*  --  12.9* 14.1* 9.5 7.7  --   HGB 11.0*   < > 11.2* 10.8* 10.4* 9.5* 9.6*  HCT 34.4*   < > 34.6* 32.2* 30.7* 29.1* 28.6*  MCV 98.6  --  99.7 97.9 97.2 96.7  --   PLT 181  --  174 163 142* 157  --    < > = values in this interval not displayed.   Cardiac Enzymes: No results for input(s): CKTOTAL, CKMB, CKMBINDEX, TROPONINI in the last 168 hours. BNP: Invalid input(s): POCBNP CBG: No results for input(s): GLUCAP in the last 168 hours. D-Dimer No results for input(s): DDIMER in the last 72 hours. Hgb A1c Recent Labs    07/15/20 0658  HGBA1C 6.0*   Lipid Profile No results for input(s): CHOL, HDL, LDLCALC, TRIG, CHOLHDL, LDLDIRECT in the last 72 hours. Thyroid function studies No results for input(s): TSH, T4TOTAL, T3FREE, THYROIDAB in the last 72 hours.  Invalid input(s): FREET3 Anemia work up Recent Labs    07/17/20 1223  VITAMINB12 332  FOLATE 9.4  FERRITIN 132  TIBC 214*  IRON 30*  RETICCTPCT 4.7*   Urinalysis    Component Value Date/Time   COLORURINE PINK (A) 07/13/2020  0205   APPEARANCEUR CLOUDY (A) 07/13/2020 0205   LABSPEC 1.021 07/13/2020 0205   PHURINE 5.0 07/13/2020 0205   GLUCOSEU NEGATIVE 07/13/2020 0205   HGBUR LARGE (A) 07/13/2020 0205   BILIRUBINUR  NEGATIVE 07/13/2020 0205   KETONESUR 5 (A) 07/13/2020 0205   PROTEINUR 100 (A) 07/13/2020 0205   UROBILINOGEN 0.2 05/27/2013 0029   NITRITE NEGATIVE 07/13/2020 0205   LEUKOCYTESUR NEGATIVE 07/13/2020 0205   Sepsis Labs Invalid input(s): PROCALCITONIN,  WBC,  LACTICIDVEN   Time coordinating discharge: 45 minutes  SIGNED:  Mercy Riding, MD  [facility] 07/17/2020, 6:08 PM  If 7PM-7AM, please contact night-coverage [email or web address]

## 2020-07-18 ENCOUNTER — Encounter (HOSPITAL_COMMUNITY): Payer: Self-pay | Admitting: Internal Medicine

## 2020-07-19 ENCOUNTER — Telehealth: Payer: Self-pay

## 2020-07-19 DIAGNOSIS — M1990 Unspecified osteoarthritis, unspecified site: Secondary | ICD-10-CM | POA: Diagnosis not present

## 2020-07-19 DIAGNOSIS — K922 Gastrointestinal hemorrhage, unspecified: Secondary | ICD-10-CM | POA: Diagnosis not present

## 2020-07-19 DIAGNOSIS — D649 Anemia, unspecified: Secondary | ICD-10-CM | POA: Diagnosis not present

## 2020-07-19 DIAGNOSIS — I482 Chronic atrial fibrillation, unspecified: Secondary | ICD-10-CM | POA: Diagnosis not present

## 2020-07-19 DIAGNOSIS — E785 Hyperlipidemia, unspecified: Secondary | ICD-10-CM | POA: Diagnosis not present

## 2020-07-19 DIAGNOSIS — Z9181 History of falling: Secondary | ICD-10-CM | POA: Diagnosis not present

## 2020-07-19 DIAGNOSIS — K5790 Diverticulosis of intestine, part unspecified, without perforation or abscess without bleeding: Secondary | ICD-10-CM | POA: Diagnosis not present

## 2020-07-19 DIAGNOSIS — I10 Essential (primary) hypertension: Secondary | ICD-10-CM | POA: Diagnosis not present

## 2020-07-19 DIAGNOSIS — Z8546 Personal history of malignant neoplasm of prostate: Secondary | ICD-10-CM | POA: Diagnosis not present

## 2020-07-19 NOTE — Telephone Encounter (Cosign Needed)
Transition Care Management Follow-up Telephone Call Son Glen [name]. Answered question for Dad  Date of discharge and from where: 07/17/20 [facility]  How have you been since you were released from the hospital? Doing ok was with home health at time   Any questions or concerns? No  Items Reviewed:  Did the pt receive and understand the discharge instructions provided? Yes   Medications obtained and verified? Yes   Other? Yes   Any new allergies since your discharge? No   Dietary orders reviewed? Yes  Do you have support at home? Yes   Home Care and Equipment/Supplies: Were home health services ordered? yes If so, what is the name of the agency? [facility] Has the agency set up a time to come to the patient's home? yes Were any new equipment or medical supplies ordered?  Yes: PT/OT / rollator What is the name of the medical supply agency?  Were you able to get the supplies/equipment? yes Do you have any questions related to the use of the equipment or supplies? No  Functional Questionnaire: (I = Independent and D = Dependent) ADLs: D  Bathing/Dressing- D  Meal Prep- D  Eating- D  Maintaining continence-   Transferring/Ambulation- D  Managing Meds- D pt Son is there to assist with care   Follow up appointments reviewed:   PCP Hospital f/u appt confirmed? Yes  Scheduled to see Dr Glen [name]  on 07/23/18 @ 11:20.  [facility] f/u appt confirmed? Yes  Scheduled to see [facility],  07/20/20  @ 3:00 p.m.  Are transportation arrangements needed? No   If their condition worsens, is the pt aware to call PCP or go to the Emergency Dept.? Yes  Was the patient provided with contact information for the PCP's office or ED? Yes  Was to pt encouraged to call back with questions or concerns? Yes

## 2020-07-19 NOTE — Telephone Encounter (Signed)
Called and lm providing VO on Laura vm.

## 2020-07-19 NOTE — Telephone Encounter (Signed)
.  Home Health Verbal Orders  Agency:  [facility]   Caller: Mickel Baas [phone] can LVM if needed    Requesting OT/ PT/ Skilled nursing/ Social Work/ Speech:  PT   Reason for Request:  finished home health care  Frequency:  2x week for 4 weeks  1x week for 4 weeks

## 2020-07-20 ENCOUNTER — Encounter: Payer: Self-pay | Admitting: Cardiology

## 2020-07-20 ENCOUNTER — Ambulatory Visit (INDEPENDENT_AMBULATORY_CARE_PROVIDER_SITE_OTHER): Payer: Medicare Other | Admitting: Cardiology

## 2020-07-20 ENCOUNTER — Other Ambulatory Visit: Payer: Self-pay

## 2020-07-20 VITALS — BP 126/70 | HR 95 | Ht 72.0 in | Wt 220.0 lb

## 2020-07-20 DIAGNOSIS — I4821 Permanent atrial fibrillation: Secondary | ICD-10-CM | POA: Diagnosis not present

## 2020-07-20 DIAGNOSIS — K264 Chronic or unspecified duodenal ulcer with hemorrhage: Secondary | ICD-10-CM

## 2020-07-20 DIAGNOSIS — Z09 Encounter for follow-up examination after completed treatment for conditions other than malignant neoplasm: Secondary | ICD-10-CM

## 2020-07-20 DIAGNOSIS — I10 Essential (primary) hypertension: Secondary | ICD-10-CM | POA: Diagnosis not present

## 2020-07-20 NOTE — Patient Instructions (Addendum)
Medication Instructions:  No Changes In Medications at this time.  *If you need a refill on your cardiac medications before your next appointment, please call your pharmacy*  Lab Work: None Ordered At This Time.  If you have labs (blood work) drawn today and your tests are completely normal, you will receive your results only by: Marland Kitchen MyChart Message (if you have MyChart) OR . A paper copy in the mail If you have any lab test that is abnormal or we need to change your treatment, we will call you to review the results.  Testing/Procedures: None Ordered At This Time.   Follow-Up: At [facility], you and your health needs are our priority.  As part of our continuing mission to provide you with exceptional heart care, we have created designated Provider Care Teams.  These Care Teams include your primary Cardiologist (physician) and Advanced Practice Providers (APPs -  Physician Assistants and Nurse Practitioners) who all work together to provide you with the care you need, when you need it.  Your next appointment:   PLEASE MOVE UP NEXT APPOINTMENT WITH DR. Claiborne Billings TO January   The format for your next appointment:   In Person  Provider:   Dr. Claiborne Billings

## 2020-07-20 NOTE — Progress Notes (Signed)
Cardiology Office Note:    Date:  07/20/2020   ID:  Glen [name], DOB 27-Feb-1928, MRN [id number]  PCP:  [name], MD  Cardiologist:  Dr. Claiborne Billings  No chief complaint on file.   History of Present Illness:    Glen [name] is a 86 y.o. male with a hx of hypertension, hyperlidemia and permanent AF, previously on coumadin but now on no anticoagulation who is seen for a post hospital visit. This is an interim visit until he can be seen by Dr. Claiborne Billings.  Today: I reviewed his recent hospitalization. He is seen today for a 7 day transition of care visit. All medications reviewed, as well as review of hospital course and discharge summary.  Here with his daughter in law today. Patient's main concern are for over the counter medications for sinus congestion, allergies, pain, and sleep. He was told that he could not take anything until this was cleared by a doctor. We discussed this at length today. Discussed absolute avoidance of NSAIDs. [place] for physical therapy, etc from a heart perspective.   Reviewed recent hospitalization for GI bleed. ECG 07/14/20 afib RVR with PVC. On no anticoagulation, no NSAIDs now. Only cardiac medication currently is metoprolol, and heart rate is 95 bpm today. I discussed changing to extended release metoprolol but he declines and would prefer to keep tartrate, as he says this is what Dr. Claiborne Billings has recommended to him.  He is difficult to keep on task during the interview today. He denies chest pain. Feels that breathing is stable. He does have chronic LE edema, which he endorses is from the metoprolol. We discussed chronic venous insufficiency in depth today. No syncope. Cannot feel his afib.  He has not seen any tarry stools, but he has seen dark stools due to the pepto-bismol he was instructed to take 4 times/day for two weeks for his H. Pylori. Has occasional nausea.   Past Medical History:  Diagnosis Date  . Atrial fibrillation ([place])   . Diverticulitis   .  Family history of diabetes insipidus   . FHx: colon cancer   . History of colonic polyps   . Hyperlipidemia   . Hypertension   . Neoplasm of prostate    special screening malignant   . Obesity   . Palpitation     Past Surgical History:  Procedure Laterality Date  . 2-D Echocardiogram  November 2009, 2011  . Anal Fistula Repair    . BIOPSY  07/14/2020   Procedure: BIOPSY;  Surgeon: [name], MD;  Location: [facility] ENDOSCOPY;  Service: Gastroenterology;;  . CARDIAC VALVE SURGERY  1996   status post balloon valvuloplasty at [facility]  . CATARACT EXTRACTION  2004  . COLONOSCOPY  December 2005  . ESOPHAGOGASTRODUODENOSCOPY  07/14/2020  . ESOPHAGOGASTRODUODENOSCOPY (EGD) WITH PROPOFOL N/A 07/14/2020   Procedure: ESOPHAGOGASTRODUODENOSCOPY (EGD) WITH PROPOFOL;  Surgeon: [name], MD;  Location: [place];  Service: Gastroenterology;  Laterality: N/A;  . ETT  1991   Negative  . HEMOSTASIS CLIP PLACEMENT  07/14/2020   HEMOSTASIS CLIP PLACEMENT  . HEMOSTASIS CLIP PLACEMENT  07/14/2020   Procedure: HEMOSTASIS CLIP PLACEMENT;  Surgeon: [name], MD;  Location: [facility] ENDOSCOPY;  Service: Gastroenterology;;  . KNEE ARTHROSCOPY     Right  . TONSILLECTOMY      Current Medications: Current Outpatient Medications on File Prior to Visit  Medication Sig  . acetaminophen (TYLENOL) 500 MG tablet Take 1,000 mg by mouth every 6 (six) hours as  needed for pain.   . Bismuth Subsalicylate 161 MG TABS Take 2 tablets (524 mg total) by mouth in the morning, at noon, in the evening, and at bedtime for 14 days.  Marland Kitchen doxycycline (VIBRAMYCIN) 100 MG capsule Take 1 capsule (100 mg total) by mouth 2 (two) times daily for 14 days.  Derrill Memo ON 07/24/2020] ferrous sulfate 325 (65 FE) MG EC tablet Take 1 tablet (325 mg total) by mouth 2 (two) times daily.  . metoprolol tartrate (LOPRESSOR) 100 MG tablet Take 1 tablet (100 mg total) by mouth 2 (two) times daily.  . metroNIDAZOLE (FLAGYL) 250 MG tablet Take 1  tablet (250 mg total) by mouth 2 (two) times daily for 14 days.  . ondansetron (ZOFRAN) 4 MG tablet Take 1 tablet (4 mg total) by mouth every 8 (eight) hours as needed for nausea or vomiting.  . pantoprazole (PROTONIX) 40 MG tablet Take 1 tablet (40 mg total) by mouth 2 (two) times daily for 60 days, THEN 1 tablet (40 mg total) daily.  . polyethylene glycol powder (MIRALAX) 17 GM/SCOOP powder Take 17 g by mouth 2 (two) times daily as needed for moderate constipation.   No current facility-administered medications on file prior to visit.   actually taking 75 mg BID metoprolol  Allergies:   Codeine   Social History   Tobacco Use  . Smoking status: Never Smoker  . Smokeless tobacco: Never Used  Vaping Use  . Vaping Use: Never used  Substance Use Topics  . Alcohol use: No  . Drug use: Never    Family History: The patient's family history includes Colon cancer in an other family member; Coronary artery disease in an other family member; Diabetes in an other family member; Heart attack in his father; Heart disease in an other family member; Prostate cancer in his brother; Stroke in his brother.  ROS:   Please see the history of present illness.  Additional pertinent ROS otherwise unremarkable.  EKGs/Labs/Other Studies Reviewed:    The following studies were reviewed today: Echo from 2016: Left ventricle: The cavity size was normal. There was mild concentric hypertrophy. Systolic function was normal. The estimated ejection fraction was in the range of 55% to 60%. Wall motion was normal; there were no regional wall motion abnormalities. - Aortic valve: There was trivial regurgitation. - Left atrium: The atrium was moderately dilated.  EKG:  ECG is personally reviewed. The ECG ordered today shows atrial fibrillation at a rate of 95 bpm.  Recent Labs: 12/04/2019: Magnesium 2.0 06/21/2020: TSH 3.02 07/13/2020: ALT 11 07/14/2020: BUN 38; Creatinine, Ser 0.92; Potassium 4.0;  Sodium 139 07/16/2020: Platelets 157 07/17/2020: Hemoglobin 9.6  Recent Lipid Panel    Component Value Date/Time   CHOL 197 09/11/2018 1045   TRIG 162 (H) 09/11/2018 1045   TRIG 150 (H) 09/06/2006 1031   HDL 39 (L) 09/11/2018 1045   CHOLHDL 5.1 (H) 09/11/2018 1045   CHOLHDL 5.0 06/16/2016 0946   VLDL 43 (H) 06/16/2016 0946   LDLCALC 126 (H) 09/11/2018 1045   LDLDIRECT 126.0 04/19/2015 1000    Physical Exam:    VS:  BP 126/70   Pulse 95   Ht 6' (1.829 m)   Wt 220 lb (99.8 kg)   SpO2 96%   BMI 29.84 kg/m     Wt Readings from Last 3 Encounters:  07/20/20 220 lb (99.8 kg)  07/14/20 225 lb (102.1 kg)  07/12/20 227 lb 1.2 oz (103 kg)    GEN: Well nourished, well  developed in no acute distress HEENT: Normal, moist mucous membranes NECK: No JVD CARDIAC: irregularly irregular rhythm, normal S1 and S2, no rubs or gallops. No murmur. VASCULAR: Radial pulses 2+ bilaterally. No carotid bruits RESPIRATORY:  Clear to auscultation without rales, wheezing or rhonchi  ABDOMEN: Soft, non-tender, non-distended MUSCULOSKELETAL:  Moves all 4 limbs independently, but sitting in wheelchair SKIN: Warm and dry. Mild bilateral nonpitting edema with skin changes, right more prominent than left. NEUROLOGIC:  Alert and oriented x 3. No focal neuro deficits noted. PSYCHIATRIC:  Normal affect   ASSESSMENT:    1. Hospital discharge follow-up   2. Permanent atrial fibrillation (HCC)   3. Duodenal ulcer hemorrhage   4. Essential hypertension    PLAN:    Recent hospitalization for GI bleed: -notes, endoscopy, discharge summary all personally reviewed today -is undergoing H pylori treatment, per GI -instructed on what to monitor for  Atrial fibrillation, permanent -no longer on anticoagulation given recent GI bleed.  -CHA2DS2/VAS Stroke Risk Points= 3 -rate controlled with metoprolol tartrate 100 mg BID, declined change to succinate today  Hypertension: -well controlled on metoprolol alone  today  Follow up: 3 mos with Dr. Claiborne Billings  Medication Adjustments/Labs and Tests Ordered: Current medicines are reviewed at length with the patient today.  Concerns regarding medicines are outlined above.  No orders of the defined types were placed in this encounter.  No orders of the defined types were placed in this encounter.   Patient Instructions  Medication Instructions:  No Changes In Medications at this time.  *If you need a refill on your cardiac medications before your next appointment, please call your pharmacy*  Lab Work: None Ordered At This Time.  If you have labs (blood work) drawn today and your tests are completely normal, you will receive your results only by: Marland Kitchen MyChart Message (if you have MyChart) OR . A paper copy in the mail If you have any lab test that is abnormal or we need to change your treatment, we will call you to review the results.  Testing/Procedures: None Ordered At This Time.   Follow-Up: At [facility], you and your health needs are our priority.  As part of our continuing mission to provide you with exceptional heart care, we have created designated Provider Care Teams.  These Care Teams include your primary Cardiologist (physician) and Advanced Practice Providers (APPs -  Physician Assistants and Nurse Practitioners) who all work together to provide you with the care you need, when you need it.  Your next appointment:   PLEASE MOVE UP NEXT APPOINTMENT WITH DR. Claiborne Billings TO January   The format for your next appointment:   In Person  Provider:   Dr. Claiborne Billings     Signed, Buford Dresser, MD PhD 07/20/2020 6:43 PM    [place]

## 2020-07-21 DIAGNOSIS — K922 Gastrointestinal hemorrhage, unspecified: Secondary | ICD-10-CM | POA: Diagnosis not present

## 2020-07-21 DIAGNOSIS — I10 Essential (primary) hypertension: Secondary | ICD-10-CM | POA: Diagnosis not present

## 2020-07-21 DIAGNOSIS — E785 Hyperlipidemia, unspecified: Secondary | ICD-10-CM | POA: Diagnosis not present

## 2020-07-21 DIAGNOSIS — I482 Chronic atrial fibrillation, unspecified: Secondary | ICD-10-CM | POA: Diagnosis not present

## 2020-07-21 DIAGNOSIS — M1990 Unspecified osteoarthritis, unspecified site: Secondary | ICD-10-CM | POA: Diagnosis not present

## 2020-07-21 DIAGNOSIS — D649 Anemia, unspecified: Secondary | ICD-10-CM | POA: Diagnosis not present

## 2020-07-23 ENCOUNTER — Ambulatory Visit (INDEPENDENT_AMBULATORY_CARE_PROVIDER_SITE_OTHER): Payer: Medicare Other | Admitting: Family Medicine

## 2020-07-23 ENCOUNTER — Encounter: Payer: Self-pay | Admitting: Family Medicine

## 2020-07-23 ENCOUNTER — Other Ambulatory Visit: Payer: Self-pay | Admitting: Cardiovascular Disease

## 2020-07-23 ENCOUNTER — Other Ambulatory Visit: Payer: Self-pay

## 2020-07-23 VITALS — BP 111/69 | HR 96 | Temp 98.2°F | Ht 72.0 in | Wt 218.2 lb

## 2020-07-23 DIAGNOSIS — I482 Chronic atrial fibrillation, unspecified: Secondary | ICD-10-CM | POA: Diagnosis not present

## 2020-07-23 DIAGNOSIS — W19XXXD Unspecified fall, subsequent encounter: Secondary | ICD-10-CM

## 2020-07-23 DIAGNOSIS — I10 Essential (primary) hypertension: Secondary | ICD-10-CM

## 2020-07-23 DIAGNOSIS — R319 Hematuria, unspecified: Secondary | ICD-10-CM | POA: Diagnosis not present

## 2020-07-23 DIAGNOSIS — K922 Gastrointestinal hemorrhage, unspecified: Secondary | ICD-10-CM

## 2020-07-23 MED ORDER — METOPROLOL TARTRATE 100 MG PO TABS
100.0000 mg | ORAL_TABLET | Freq: Two times a day (BID) | ORAL | 3 refills | Status: DC
Start: 2020-07-23 — End: 2020-08-10

## 2020-07-23 NOTE — Patient Instructions (Signed)
It was very nice to see you today!  I am glad that you are doing better.  We will check blood work and a urine sample today.   Please keep your appointment with your GI doctor in December and let us know if your symptoms return.   Take care, Dr [name]  Please try these tips to maintain a healthy lifestyle:   Eat at least 3 REAL meals and 1-2 snacks per day.  Aim for no more than 5 hours between eating.  If you eat breakfast, please do so within one hour of getting up.    Each meal should contain half fruits/vegetables, one quarter protein, and one quarter carbs (no bigger than a computer mouse)   Cut down on sweet beverages. This includes juice, soda, and sweet tea.     Drink at least 1 glass of water with each meal and aim for at least 8 glasses per day   Exercise at least 150 minutes every week.

## 2020-07-23 NOTE — Telephone Encounter (Signed)
Patient's daughter in law, Juliann Pulse, is concerned as she states the patient's prescription that was initially sent in was for 90 tablets with 4 refills. She assumes the Rx should have been for 180 tablets instead since the patient is to take the medication twice daily. She requested to have an additional Rx faxed to the pharmacy with instructions for 180 tablets. Please assist.    *STAT* If patient is at the pharmacy, call can be transferred to refill team.   1. Which medications need to be refilled? (please list name of each medication and dose if known) metoprolol tartrate (LOPRESSOR) 100 MG tablet  2. Which pharmacy/location (including street and city if local pharmacy) is medication to be sent to? [place], [place]  3. Do they need a 30 day or 90 day supply? 90 day supply

## 2020-07-23 NOTE — Telephone Encounter (Signed)
Refill sent to requested pharmacy.

## 2020-07-23 NOTE — Assessment & Plan Note (Signed)
Recently saw cardiology. No longer on anticoagulation due to GI bleed. Will continue lopressor 100mg  twice daily.

## 2020-07-23 NOTE — Addendum Note (Signed)
Addended by: Merri Ray A on: 07/23/2020 03:19 PM   Modules accepted: Orders

## 2020-07-23 NOTE — Progress Notes (Signed)
   Glen [name] is a 86 y.o. male who presents today for an office visit.  Assessment/Plan:  New/Acute Problems: GI Bleed / PUD / H Pylori Currently on quadruple therapy. No further episodes of melena since discharge. Will check CBC today. He will follow up with GI 2 months.   Hematuria Will check UA today. May need urology referral if persistent.   Chronic Problems Addressed Today: Atrial fibrillation, chronic [facility]) Recently saw cardiology. No longer on anticoagulation due to GI bleed. Will continue lopressor 100mg  twice daily.   Essential hypertension At goal on lopressor 100mg  bid.   [place] Continue home health PT.      Subjective:  HPI:  Patient is here for hospital follow-up.  He was admitted to the hospital on 07/13/2028 and discharged on 07/17/2020 with symptomatic anemia secondary to GI bleed.  He was initially hypertensive on presentation to the ED. he had positive Hemoccult and hemoglobin 12.6.  Was admitted.  Underwent GI work-up which showed duodenal ulcer and biopsy was positive for H. pylori.  He was treated with quadruple therapy.  Hemoglobin stabilized at the time of discharge.  Generalized weakness and dizziness improved as well.  HCTZ was discontinued.  PT was consulted recommended and PT/OT. Patient was discharged home.   He has been doing well since being discharged home. He has been working with PT. No further signs of GI bleed. Has been tolerating medication changes without adverse reactions.        Objective:  Physical Exam: BP 111/69   Pulse 96   Temp 98.2 F (36.8 C) (Temporal)   Ht 6' (1.829 m)   Wt 218 lb 3.2 oz (99 kg)   SpO2 97%   BMI 29.59 kg/m   Gen: No acute distress, resting comfortably CV: Irregular with no murmurs appreciated.  Pulm: Normal work of breathing, clear to auscultation bilaterally with no crackles, wheezes, or rhonchi Neuro: Grossly normal, moves all extremities Psych: Normal affect and thought content  Time Spent: 60  minutes of total time was spent on the date of the encounter performing the following actions: chart review prior to seeing the patient, obtaining history, performing a medically necessary exam, counseling on the treatment plan, placing orders, and documenting in our EHR.        [name]. [name], MD 07/23/2020 12:20 PM

## 2020-07-23 NOTE — Assessment & Plan Note (Signed)
At goal on lopressor 100mg  bid.

## 2020-07-23 NOTE — Assessment & Plan Note (Signed)
Continue home health PT.

## 2020-07-24 LAB — CBC
HCT: 38 % — ABNORMAL LOW (ref 38.5–50.0)
Hemoglobin: 12.6 g/dL — ABNORMAL LOW (ref 13.2–17.1)
MCH: 32.8 pg (ref 27.0–33.0)
MCHC: 33.2 g/dL (ref 32.0–36.0)
MCV: 99 fL (ref 80.0–100.0)
MPV: 10.5 fL (ref 7.5–12.5)
Platelets: 275 10*3/uL (ref 140–400)
RBC: 3.84 10*6/uL — ABNORMAL LOW (ref 4.20–5.80)
RDW: 13.3 % (ref 11.0–15.0)
WBC: 10.7 10*3/uL (ref 3.8–10.8)

## 2020-07-24 LAB — URINALYSIS, ROUTINE W REFLEX MICROSCOPIC
Bilirubin Urine: NEGATIVE
Glucose, UA: NEGATIVE
Hgb urine dipstick: NEGATIVE
Ketones, ur: NEGATIVE
Leukocytes,Ua: NEGATIVE
Nitrite: NEGATIVE
Protein, ur: NEGATIVE
Specific Gravity, Urine: 1.012 (ref 1.001–1.03)
pH: <=5 (ref 5.0–8.0)

## 2020-07-24 LAB — COMPREHENSIVE METABOLIC PANEL
AG Ratio: 1.8 (calc) (ref 1.0–2.5)
ALT: 14 U/L (ref 9–46)
AST: 17 U/L (ref 10–35)
Albumin: 3.8 g/dL (ref 3.6–5.1)
Alkaline phosphatase (APISO): 75 U/L (ref 35–144)
BUN: 12 mg/dL (ref 7–25)
CO2: 24 mmol/L (ref 20–32)
Calcium: 8.8 mg/dL (ref 8.6–10.3)
Chloride: 103 mmol/L (ref 98–110)
Creat: 1.08 mg/dL (ref 0.70–1.11)
Globulin: 2.1 g/dL (calc) (ref 1.9–3.7)
Glucose, Bld: 97 mg/dL (ref 65–99)
Potassium: 4.2 mmol/L (ref 3.5–5.3)
Sodium: 138 mmol/L (ref 135–146)
Total Bilirubin: 0.5 mg/dL (ref 0.2–1.2)
Total Protein: 5.9 g/dL — ABNORMAL LOW (ref 6.1–8.1)

## 2020-07-24 LAB — MAGNESIUM: Magnesium: 2.3 mg/dL (ref 1.5–2.5)

## 2020-07-26 DIAGNOSIS — I10 Essential (primary) hypertension: Secondary | ICD-10-CM | POA: Diagnosis not present

## 2020-07-26 DIAGNOSIS — M1990 Unspecified osteoarthritis, unspecified site: Secondary | ICD-10-CM | POA: Diagnosis not present

## 2020-07-26 DIAGNOSIS — I482 Chronic atrial fibrillation, unspecified: Secondary | ICD-10-CM | POA: Diagnosis not present

## 2020-07-26 DIAGNOSIS — K922 Gastrointestinal hemorrhage, unspecified: Secondary | ICD-10-CM | POA: Diagnosis not present

## 2020-07-26 DIAGNOSIS — E785 Hyperlipidemia, unspecified: Secondary | ICD-10-CM | POA: Diagnosis not present

## 2020-07-26 DIAGNOSIS — D649 Anemia, unspecified: Secondary | ICD-10-CM | POA: Diagnosis not present

## 2020-07-26 NOTE — Progress Notes (Signed)
Please inform patient of the following:  Labs are all STABLE.  [name]. [name], MD 07/26/2020 12:54 PM

## 2020-07-27 ENCOUNTER — Telehealth: Payer: Self-pay

## 2020-07-27 NOTE — Telephone Encounter (Signed)
Patients daughter in law stated patient stated he would be better off dead and didn't want to be alive anymore, I advised her to call 911 and she said he would " blow a gasket " she stated she was not going to do that.Patient has anger issues according to Juliann Pulse he treats his wife and children poorly and she is concerned for his mental health. I provided her with the number to [facility] nurseline. To help with maybe getting care management. Patient is very against any kind of therapist.

## 2020-07-27 NOTE — Telephone Encounter (Signed)
Can we call and check on patient? He needs to go to ED if he is actively suicidal. We can offer him an appointment to discuss mental health if he is interested.  Glen [name]. [name], MD 07/27/2020 1:40 PM

## 2020-07-27 NOTE — Telephone Encounter (Signed)
Called and lm on pt vm tcb. 

## 2020-07-28 ENCOUNTER — Other Ambulatory Visit: Payer: Self-pay | Admitting: *Deleted

## 2020-07-28 DIAGNOSIS — I10 Essential (primary) hypertension: Secondary | ICD-10-CM | POA: Diagnosis not present

## 2020-07-28 DIAGNOSIS — I482 Chronic atrial fibrillation, unspecified: Secondary | ICD-10-CM | POA: Diagnosis not present

## 2020-07-28 DIAGNOSIS — D649 Anemia, unspecified: Secondary | ICD-10-CM | POA: Diagnosis not present

## 2020-07-28 DIAGNOSIS — E785 Hyperlipidemia, unspecified: Secondary | ICD-10-CM | POA: Diagnosis not present

## 2020-07-28 DIAGNOSIS — K922 Gastrointestinal hemorrhage, unspecified: Secondary | ICD-10-CM | POA: Diagnosis not present

## 2020-07-28 DIAGNOSIS — M1990 Unspecified osteoarthritis, unspecified site: Secondary | ICD-10-CM | POA: Diagnosis not present

## 2020-07-28 NOTE — Telephone Encounter (Signed)
Pt.'s daughter in law called following up on this. She asked that you call her. She states pt will be very angry if he is approached about this. Daughter in law also mentioned that there is a family history of Alzheimer's. She states pt is having some hallucinations as well.  [place]

## 2020-07-28 NOTE — Patient Outreach (Signed)
[facility]) Care Management  07/28/2020  ADAN BAEHR [birth year] [id number]   Telephone outreach to pt's daughter, Mackson Botz, who called the nurse line needing guidance on how to help her father-in-law and family with pt's anger and dissatisfaction with his environment with his loves ones.  Advised Mr. Krager is a part of our pt base but I will need permission by a designated family member to go forward with establishing a relationship.  Juliann Pulse will get the permission and we will then decide how to proceed from there.  Eulah Pont. Myrtie Neither, MSN, [facility] Gerontological Nurse Practitioner [facility] Care Management [phone]

## 2020-07-28 NOTE — Telephone Encounter (Signed)
Called and spoke with pt daughter in law and advised OV. Pt daughter in law states she will call the office to schedule an appointment. She states that pt will not admit that he has a problem and will lose it if anything is mentioned about Alzheimers so she is unsure of the best way for the visit to be conducted. She would like to know if there are any test that can be done for this at the time of visit and states pt will not open up to discuss his issues and wanted Dr. [name] to know in advance so he can figure out a way to bring it out of patient so that pt can get the help he needs.

## 2020-08-02 DIAGNOSIS — M1990 Unspecified osteoarthritis, unspecified site: Secondary | ICD-10-CM | POA: Diagnosis not present

## 2020-08-02 DIAGNOSIS — I482 Chronic atrial fibrillation, unspecified: Secondary | ICD-10-CM | POA: Diagnosis not present

## 2020-08-02 DIAGNOSIS — K922 Gastrointestinal hemorrhage, unspecified: Secondary | ICD-10-CM | POA: Diagnosis not present

## 2020-08-02 DIAGNOSIS — I10 Essential (primary) hypertension: Secondary | ICD-10-CM | POA: Diagnosis not present

## 2020-08-02 DIAGNOSIS — E785 Hyperlipidemia, unspecified: Secondary | ICD-10-CM | POA: Diagnosis not present

## 2020-08-02 DIAGNOSIS — D649 Anemia, unspecified: Secondary | ICD-10-CM | POA: Diagnosis not present

## 2020-08-02 NOTE — Telephone Encounter (Signed)
Patient's daughter is calling in asking if someone is able to give her a call back about the previous message

## 2020-08-04 DIAGNOSIS — E785 Hyperlipidemia, unspecified: Secondary | ICD-10-CM | POA: Diagnosis not present

## 2020-08-04 DIAGNOSIS — M1990 Unspecified osteoarthritis, unspecified site: Secondary | ICD-10-CM | POA: Diagnosis not present

## 2020-08-04 DIAGNOSIS — I482 Chronic atrial fibrillation, unspecified: Secondary | ICD-10-CM | POA: Diagnosis not present

## 2020-08-04 DIAGNOSIS — D649 Anemia, unspecified: Secondary | ICD-10-CM | POA: Diagnosis not present

## 2020-08-04 DIAGNOSIS — K922 Gastrointestinal hemorrhage, unspecified: Secondary | ICD-10-CM | POA: Diagnosis not present

## 2020-08-04 DIAGNOSIS — I10 Essential (primary) hypertension: Secondary | ICD-10-CM | POA: Diagnosis not present

## 2020-08-05 ENCOUNTER — Ambulatory Visit: Payer: Medicare Other | Admitting: Family Medicine

## 2020-08-09 ENCOUNTER — Other Ambulatory Visit: Payer: Self-pay

## 2020-08-09 ENCOUNTER — Telehealth: Payer: Self-pay | Admitting: Internal Medicine

## 2020-08-09 ENCOUNTER — Encounter (HOSPITAL_COMMUNITY): Payer: Self-pay

## 2020-08-09 ENCOUNTER — Observation Stay (HOSPITAL_COMMUNITY)
Admission: EM | Admit: 2020-08-09 | Discharge: 2020-08-10 | Disposition: A | Discharge status: Home / Self Care | Payer: Medicare Other | Attending: Internal Medicine | Admitting: Internal Medicine

## 2020-08-09 DIAGNOSIS — I482 Chronic atrial fibrillation, unspecified: Secondary | ICD-10-CM | POA: Diagnosis not present

## 2020-08-09 DIAGNOSIS — K921 Melena: Secondary | ICD-10-CM | POA: Diagnosis not present

## 2020-08-09 DIAGNOSIS — Z8546 Personal history of malignant neoplasm of prostate: Secondary | ICD-10-CM | POA: Diagnosis not present

## 2020-08-09 DIAGNOSIS — Z79899 Other long term (current) drug therapy: Secondary | ICD-10-CM | POA: Insufficient documentation

## 2020-08-09 DIAGNOSIS — I10 Essential (primary) hypertension: Secondary | ICD-10-CM | POA: Diagnosis not present

## 2020-08-09 DIAGNOSIS — Z85038 Personal history of other malignant neoplasm of large intestine: Secondary | ICD-10-CM | POA: Diagnosis not present

## 2020-08-09 DIAGNOSIS — K279 Peptic ulcer, site unspecified, unspecified as acute or chronic, without hemorrhage or perforation: Secondary | ICD-10-CM

## 2020-08-09 LAB — COMPREHENSIVE METABOLIC PANEL
ALT: 20 U/L (ref 0–44)
AST: 24 U/L (ref 15–41)
Albumin: 3.7 g/dL (ref 3.5–5.0)
Alkaline Phosphatase: 72 U/L (ref 38–126)
Anion gap: 8 (ref 5–15)
BUN: 13 mg/dL (ref 8–23)
CO2: 21 mmol/L — ABNORMAL LOW (ref 22–32)
Calcium: 8.3 mg/dL — ABNORMAL LOW (ref 8.9–10.3)
Chloride: 106 mmol/L (ref 98–111)
Creatinine, Ser: 0.9 mg/dL (ref 0.61–1.24)
GFR, Estimated: >60 mL/min (ref >60)
Glucose, Bld: 149 mg/dL — ABNORMAL HIGH (ref 70–99)
Potassium: 3.7 mmol/L (ref 3.5–5.1)
Sodium: 135 mmol/L (ref 135–145)
Total Bilirubin: 0.7 mg/dL (ref 0.3–1.2)
Total Protein: 6.1 g/dL — ABNORMAL LOW (ref 6.5–8.1)

## 2020-08-09 LAB — URINALYSIS, ROUTINE W REFLEX MICROSCOPIC
Bilirubin Urine: NEGATIVE
Glucose, UA: NEGATIVE mg/dL
Hgb urine dipstick: NEGATIVE
Ketones, ur: NEGATIVE mg/dL
Leukocytes,Ua: NEGATIVE
Nitrite: NEGATIVE
Protein, ur: NEGATIVE mg/dL
Specific Gravity, Urine: 1.014 (ref 1.005–1.030)
pH: 5 (ref 5.0–8.0)

## 2020-08-09 LAB — CBC
HCT: 42.7 % (ref 39.0–52.0)
Hemoglobin: 14.1 g/dL (ref 13.0–17.0)
MCH: 33.3 pg (ref 26.0–34.0)
MCHC: 33 g/dL (ref 30.0–36.0)
MCV: 100.9 fL — ABNORMAL HIGH (ref 80.0–100.0)
Platelets: 203 10*3/uL (ref 150–400)
RBC: 4.23 MIL/uL (ref 4.22–5.81)
RDW: 14.4 % (ref 11.5–15.5)
WBC: 6.6 10*3/uL (ref 4.0–10.5)
nRBC: 0 % (ref 0.0–0.2)

## 2020-08-09 LAB — LIPASE, BLOOD: Lipase: 31 U/L (ref 11–51)

## 2020-08-09 MED ORDER — METOPROLOL TARTRATE 25 MG PO TABS
100.0000 mg | ORAL_TABLET | Freq: Once | ORAL | Status: DC
  Administered 2020-08-09: 100 mg via ORAL
  Filled 2020-08-09: qty 4

## 2020-08-09 MED ORDER — PANTOPRAZOLE SODIUM 40 MG IV SOLR: 40.0000 mg | Freq: Two times a day (BID) | INTRAVENOUS | Status: DC

## 2020-08-09 MED ORDER — PANTOPRAZOLE SODIUM 40 MG IV SOLR
40.0000 mg | Freq: Once | INTRAVENOUS | Status: DC
  Filled 2020-08-09: qty 40

## 2020-08-09 MED ORDER — METOPROLOL TARTRATE 25 MG PO TABS: 12.5000 mg | ORAL_TABLET | Freq: Two times a day (BID) | ORAL | Status: DC

## 2020-08-09 NOTE — ED Provider Notes (Signed)
[facility] DEPT Provider Note   CSN: [id number] Arrival date & time: 08/09/20  1155     History Chief Complaint  Patient presents with  . Abdominal Pain  . Melena    Glen [name] is a 86 y.o. male.  HPI     86 year old male comes in a chief complaint of bloody stools and abdominal discomfort. Patient has history of diverticulitis, esophagitis, upper GI ulcer leading to melena, A. fib not on anticoagulation.  Patient states that over the past few days he has noted small stools that have been dark and tarry.  Today he had a large volume stool that was tarry.  He called his GI team and they advised him to come to the ER.  Patient denies any dizziness, lightheadedness.  Abdominal pain was lower, right-sided and has resolved.  He had only one episode of large tarry stool this morning.  Past Medical History:  Diagnosis Date  . Atrial fibrillation ([place])   . Diverticulitis   . Family history of diabetes insipidus   . FHx: colon cancer   . History of colonic polyps   . Hyperlipidemia   . Hypertension   . Neoplasm of prostate    special screening malignant   . Obesity   . Palpitation     Patient Active Problem List   Diagnosis Date Noted  . Duodenal ulcer hemorrhage   . Fall 07/13/2020  . Gastrointestinal hemorrhage with hematemesis 07/13/2020  . Thrombocytopenia ([place]) 06/25/2019  . Osteoarthritis 06/25/2019  . Onychomycosis 07/29/2018  . Dysphagia 07/29/2018  . Peripheral edema 11/18/2015  . Impaired glucose tolerance 04/19/2015  . Obesity 04/19/2015  . Encounter for therapeutic drug monitoring 10/27/2013  . Current use of long term anticoagulation 06/14/2011  . Essential hypertension 01/20/2009  . Atrial fibrillation, chronic ([place]) 03/13/2008  . Dyslipidemia 07/31/2007  . History of colonic polyps 07/31/2007    Past Surgical History:  Procedure Laterality Date  . 2-D Echocardiogram  November 2009, 2011  . Anal Fistula Repair     . BIOPSY  07/14/2020   Procedure: BIOPSY;  Surgeon: [name], MD;  Location[facility] ENDOSCOPY;  Service: Gastroenterology;;  . CARDIAC VALVE SURGERY  1996   status post balloon valvuloplasty at [facility]  . CATARACT EXTRACTION  2004  . COLONOSCOPY  December 2005  . ESOPHAGOGASTRODUODENOSCOPY  07/14/2020  . ESOPHAGOGASTRODUODENOSCOPY (EGD) WITH PROPOFOL N/A 07/14/2020   Procedure: ESOPHAGOGASTRODUODENOSCOPY (EGD) WITH PROPOFOL;  Surgeon: [name], MD;  Location: [place];  Service: Gastroenterology;  Laterality: N/A;  . ETT  1991   Negative  . HEMOSTASIS CLIP PLACEMENT  07/14/2020   HEMOSTASIS CLIP PLACEMENT  . HEMOSTASIS CLIP PLACEMENT  07/14/2020   Procedure: HEMOSTASIS CLIP PLACEMENT;  Surgeon: [name], MD;  Location: [facility] ENDOSCOPY;  Service: Gastroenterology;;  . KNEE ARTHROSCOPY     Right  . TONSILLECTOMY         Family History  Problem Relation Age of Onset  . Heart attack Father   . Colon cancer Other   . Diabetes Other   . Prostate cancer Brother   . Heart disease Other   . Coronary artery disease Other        Two siblings, status post stenting to siblings with heart disease. One. Status post pacemaker insertion.  . Stroke Brother     Social History   Tobacco Use  . Smoking status: Never Smoker  . Smokeless tobacco: Never Used  Vaping Use  . Vaping Use: Never used  Substance Use Topics  . Alcohol use: No  . Drug use: Never    Home Medications Prior to Admission medications   Medication Sig Start Date End Date Taking? Authorizing Provider  acetaminophen (TYLENOL) 500 MG tablet Take 1,000 mg by mouth every 6 (six) hours as needed for pain.     [provider]  ferrous sulfate 325 (65 FE) MG EC tablet Take 1 tablet (325 mg total) by mouth 2 (two) times daily. 07/24/20 01/20/21  Mercy Riding, MD  metoprolol tartrate (LOPRESSOR) 100 MG tablet Take 1 tablet (100 mg total) by mouth 2 (two) times daily. 07/23/20   Troy Sine, MD    ondansetron (ZOFRAN) 4 MG tablet Take 1 tablet (4 mg total) by mouth every 8 (eight) hours as needed for nausea or vomiting. 07/17/20 07/17/21  Mercy Riding, MD  pantoprazole (PROTONIX) 40 MG tablet Take 1 tablet (40 mg total) by mouth 2 (two) times daily for 60 days, THEN 1 tablet (40 mg total) daily. 07/17/20 12/14/20  Mercy Riding, MD  polyethylene glycol powder (MIRALAX) 17 GM/SCOOP powder Take 17 g by mouth 2 (two) times daily as needed for moderate constipation. 07/17/20   Mercy Riding, MD    Allergies    Codeine  Review of Systems   Review of Systems  Constitutional: Positive for activity change.  Respiratory: Negative for shortness of breath.   Cardiovascular: Negative for chest pain.  Gastrointestinal: Positive for blood in stool.  Hematological: Does not bruise/bleed easily.  All other systems reviewed and are negative.   Physical Exam Updated Vital Signs BP 134/87   Pulse 90   Temp 97.6 F (36.4 C) (Oral)   Resp 15   Ht 6' (1.829 m)   Wt 97.5 kg   SpO2 97%   BMI 29.16 kg/m   Physical Exam Vitals and nursing note reviewed.  Constitutional:      Appearance: He is well-developed.  HENT:     Head: Normocephalic and atraumatic.  Eyes:     Conjunctiva/sclera: Conjunctivae normal.     Pupils: Pupils are equal, round, and reactive to light.  Cardiovascular:     Rate and Rhythm: Normal rate and regular rhythm.     Heart sounds: Normal heart sounds.  Pulmonary:     Effort: Pulmonary effort is normal. No respiratory distress.     Breath sounds: Normal breath sounds. No wheezing.  Abdominal:     General: Bowel sounds are normal. There is no distension.     Palpations: Abdomen is soft.     Tenderness: There is no abdominal tenderness. There is no guarding or rebound.     Comments: Patient has dark tarry stool  Genitourinary:    Rectum: Guaiac result positive.  Musculoskeletal:     Cervical back: Normal range of motion and neck supple.  Skin:    General: Skin  is warm.  Neurological:     Mental Status: He is alert and oriented to person, place, and time.     ED Results / Procedures / Treatments   Labs (all labs ordered are listed, but only abnormal results are displayed) Labs Reviewed  COMPREHENSIVE METABOLIC PANEL - Abnormal; Notable for the following components:      Result Value   CO2 21 (*)    Glucose, Bld 149 (*)    Calcium 8.3 (*)    Total Protein 6.1 (*)    All other components within normal limits  CBC - Abnormal; Notable for the following  components:   MCV 100.9 (*)    All other components within normal limits  LIPASE, BLOOD  URINALYSIS, ROUTINE W REFLEX MICROSCOPIC  POC OCCULT BLOOD, ED    EKG None  Radiology No results found.  Procedures Procedures (including critical care time)  Medications Ordered in ED Medications  pantoprazole (PROTONIX) injection 40 mg (has no administration in time range)    ED Course  I have reviewed the triage vital signs and the nursing notes.  Pertinent labs & imaging results that were available during my care of the patient were reviewed by me and considered in my medical decision making (see chart for details).  Clinical Course as of Aug 09 1906  Molli Knock Aug 09, 2020  1905 10/13 EGD:  - Tortuous esophagus likely in keeping with esophageal dysmotility/presbyesophagus. - Mild reflux esophagitis with no bleeding and focus of nodularity at GE junction. Biopsied. - Gastritis with no bleeding. Biopsied. - Duodenal ulcer with red spot (source of recent upper GI bleeding). Clip was placed. - Normal second portion of the duodenum.   [AN]    Clinical Course User Index [AN] Varney Biles, MD   MDM Rules/Calculators/A&P                          DDx includes: Esophagitis Mallory Weiss tear [place]  Variceal bleeding PUD/Gastritis/ulcers Diverticular bleed Colon cancer Rectal bleed Internal hemorrhoids External hemorrhoids  86 year old comes in a chief complaint of dark  tarry stools.  He has known history of esophagitis and gastritis.  Endoscopy from October 13 reviewed.  Lab results here reveal that patient's hemoglobin is better than it was earlier.  He is on iron.  We proceeded with digital rectal exam, and patient does have melena in the stool is guaiac positive.  Patient is already spoken with GI and they requested that he come to the hospital, as there is no ability for them to schedule him until December.  Will admit to hospital service.  Protonix given.  Hemodynamically stable.  Final Clinical Impression(s) / ED Diagnoses Final diagnoses:  Melena    Rx / DC Orders ED Discharge Orders    None       Varney Biles, MD 08/09/20 1907

## 2020-08-09 NOTE — H&P (Signed)
History and Physical  Glen [name] TDV:[id number] DOB: May 11, 1928 DOA: 08/09/2020  Referring physician: Dr. Kathrynn Humble, EDP PCP: [name], MD  Outpatient Specialists: GI, cardiology. Patient coming from: Home.  Chief Complaint: Nausea black tarry stools.  HPI: Glen [name] is a 86 y.o. male with medical history significant for upper GI bleed, chronic A. fib not on oral anticoagulation, peptic ulcer disease with H. pylori infection, who presented to [facility] ED from home at his gastroenterologist request due to large black tarry stool x1 on the day of presentation.  Denies any abdominal pain, nausea or vomiting.  He was in his usual state of health prior to that.  He called his GI provider's office who recommended he goes to the ED for further evaluation and management.  Patient reports visual hallucinations when he takes home Protonix.  EDP requested admission for possible recurrent upper GI bleed.  Blood pressure normotensive, hemoglobin 14.  TRH asked to admit.  ED Course: Patient expressed concern about taking Protonix stating that it gives him visual hallucinations, held.  Defer to GI to restart Protonix.  On exam, no tenderness with abdominal palpation.  Not in acute distress.  Vital signs stable on the monitor in the room.  No recurrent stools or melena reported while in the ED.  Review of Systems: Review of systems as noted in the HPI. All other systems reviewed and are negative.   Past Medical History:  Diagnosis Date   Atrial fibrillation ([place])    Diverticulitis    Family history of diabetes insipidus    FHx: colon cancer    History of colonic polyps    Hyperlipidemia    Hypertension    Neoplasm of prostate    special screening malignant    Obesity    Palpitation    Past Surgical History:  Procedure Laterality Date   2-D Echocardiogram  November 2009, 2011   Anal Fistula Repair     BIOPSY  07/14/2020   Procedure: BIOPSY;  Surgeon: [name], MD;   Location: [facility] ENDOSCOPY;  Service: Gastroenterology;;   CARDIAC VALVE SURGERY  1996   status post balloon valvuloplasty at [place]  2004   COLONOSCOPY  December 2005   ESOPHAGOGASTRODUODENOSCOPY  07/14/2020   ESOPHAGOGASTRODUODENOSCOPY (EGD) WITH PROPOFOL N/A 07/14/2020   Procedure: ESOPHAGOGASTRODUODENOSCOPY (EGD) WITH PROPOFOL;  Surgeon: [name], MD;  Location: [place];  Service: Gastroenterology;  Laterality: N/A;   ETT  1991   Negative   HEMOSTASIS CLIP PLACEMENT  07/14/2020   HEMOSTASIS CLIP PLACEMENT   HEMOSTASIS CLIP PLACEMENT  07/14/2020   Procedure: HEMOSTASIS CLIP PLACEMENT;  Surgeon: [name], MD;  Location: [facility] ENDOSCOPY;  Service: Gastroenterology;;   KNEE ARTHROSCOPY     Right   TONSILLECTOMY      Social History:  reports that he has never smoked. He has never used smokeless tobacco. He reports that he does not drink alcohol and does not use drugs.   Allergies  Allergen Reactions   Codeine Other (See Comments)    constpation    Family History  Problem Relation Age of Onset   Heart attack Father    Colon cancer Other    Diabetes Other    Prostate cancer Brother    Heart disease Other    Coronary artery disease Other        Two siblings, status post stenting to siblings with heart disease. One. Status post pacemaker insertion.   Stroke Brother  Prior to Admission medications   Medication Sig Start Date End Date Taking? Authorizing Provider  acetaminophen (TYLENOL) 500 MG tablet Take 1,000 mg by mouth every 6 (six) hours as needed for pain.     [provider]  ferrous sulfate 325 (65 FE) MG EC tablet Take 1 tablet (325 mg total) by mouth 2 (two) times daily. 07/24/20 01/20/21  Mercy Riding, MD  metoprolol tartrate (LOPRESSOR) 100 MG tablet Take 1 tablet (100 mg total) by mouth 2 (two) times daily. 07/23/20   Troy Sine, MD  ondansetron (ZOFRAN) 4 MG tablet Take 1 tablet (4 mg total) by  mouth every 8 (eight) hours as needed for nausea or vomiting. 07/17/20 07/17/21  Mercy Riding, MD  pantoprazole (PROTONIX) 40 MG tablet Take 1 tablet (40 mg total) by mouth 2 (two) times daily for 60 days, THEN 1 tablet (40 mg total) daily. 07/17/20 12/14/20  Mercy Riding, MD  polyethylene glycol powder (MIRALAX) 17 GM/SCOOP powder Take 17 g by mouth 2 (two) times daily as needed for moderate constipation. 07/17/20   Mercy Riding, MD    Physical Exam: BP 134/87    Pulse 90    Temp 97.6 F (36.4 C) (Oral)    Resp 15    Ht 6' (1.829 m)    Wt 97.5 kg    SpO2 97%    BMI 29.16 kg/m    General: 86 y.o. year-old male well developed well nourished in no acute distress.  Alert and oriented x3.  Very hard of hearing.  Cardiovascular: Regular rate and rhythm with no rubs or gallops.  No thyromegaly or JVD noted.  Trace lower extremity edema. 2/4 pulses in all 4 extremities.  Respiratory: Clear to auscultation with no wheezes or rales. Good inspiratory effort.  Abdomen: Soft nontender nondistended with normal bowel sounds x4 quadrants.  Muskuloskeletal: No cyanosis or clubbing.  Trace edema noted bilaterally  Neuro: CN II-XII intact, strength, sensation, reflexes  Skin: No ulcerative lesions noted or rashes  Psychiatry: Judgement and insight appear normal. Mood is appropriate for condition and setting          Labs on Admission:  Basic Metabolic Panel: Recent Labs  Lab 08/09/20 1249  NA 135  K 3.7  CL 106  CO2 21*  GLUCOSE 149*  BUN 13  CREATININE 0.90  CALCIUM 8.3*   Liver Function Tests: Recent Labs  Lab 08/09/20 1249  AST 24  ALT 20  ALKPHOS 72  BILITOT 0.7  PROT 6.1*  ALBUMIN 3.7   Recent Labs  Lab 08/09/20 1249  LIPASE 31   No results for input(s): AMMONIA in the last 168 hours. CBC: Recent Labs  Lab 08/09/20 1249  WBC 6.6  HGB 14.1  HCT 42.7  MCV 100.9*  PLT 203   Cardiac Enzymes: No results for input(s): CKTOTAL, CKMB, CKMBINDEX, TROPONINI in the  last 168 hours.  BNP (last 3 results) No results for input(s): BNP in the last 8760 hours.  ProBNP (last 3 results) No results for input(s): PROBNP in the last 8760 hours.  CBG: No results for input(s): GLUCAP in the last 168 hours.  Radiological Exams on Admission: No results found.  EKG: I independently viewed the EKG done and my findings are as followed: None available in the time of this exam.  Assessment/Plan Present on Admission:  Melena  Active Problems:   Melena   Melena with recent history of upper GI bleed, gastritis, PUD with H. Pylori Followed by  GI outpatient, GI requested admission Patient expresses concern about taking Protonix stating that it gives him visual hallucinations, held.  Defer to GI to restart Protonix.  Management per GI Presented with hemoglobin of 14.1 Monitor H&H, repeat CBC in the morning  PUD with H. Pylori Post EGD, biopsy taken on 07/14/2020 Unclear if patient completed his treatment for H. pylori infection Defer management to GI  Chronic A. fib not on anticoagulation Rate controlled on Lopressor, continue  Iron deficiency anemia Patient n.p.o. until assessed by GI Resume home oral iron supplement if no planned GI procedures   DVT prophylaxis: SCDs  Code Status: DNR as reported by the patient, stating he has a DNR form at home.  Family Communication: None at bedside.  Disposition Plan: Admit to telemetry unit  Consults called: Officially consult GI in the morning.  Admission status: Observation status.   Status is: Observation   Dispo:  Patient From: Home  Planned Disposition: Home  Expected discharge date: 08/10/20  Medically stable for discharge: No ongoing management of melena.   Kayleen Memos MD [facility] Pager [phone]  If 7PM-7AM, please contact night-coverage [email or web address] Password [email or web address]  08/09/2020, 7:42 PM

## 2020-08-09 NOTE — Telephone Encounter (Signed)
Hx of duodenal ulcer with bleeding in Oct treated with endoclip x 1 Given age and this hx combined with melena I think he should go to the [facility] ER for eval and treatment I expect he will need observation and repeat EGD I will alert hospital GI MD this week, Dr. Tarri Glenn. Thanks [employer]

## 2020-08-09 NOTE — Telephone Encounter (Signed)
Thank you for the heads up!

## 2020-08-09 NOTE — ED Notes (Signed)
Pt is telling me that his acid pill which begins with a "P" made him hallucinate, MD notified

## 2020-08-09 NOTE — Telephone Encounter (Signed)
Spoke with pt and he thinks he is bleeding again. Reports that he thought his stools looked a little darker for the past couple of days but this am he passed a stool that was completely black and tarry. He reports he has not taken and pepto bismol. Please advise.

## 2020-08-09 NOTE — ED Notes (Signed)
Metoprolol dose which was ordered was given to pt and when I went to chart they had changed the dose, MD notified, held scheduled dose of metoprolol

## 2020-08-09 NOTE — Telephone Encounter (Signed)
Patient called stated his bleeding ulcer is back please advise

## 2020-08-09 NOTE — ED Triage Notes (Signed)
Pt believes he has a bleeding ulcer. Pt c/o black, starry stools this morning. Also c/o abdominal pain.

## 2020-08-09 NOTE — Telephone Encounter (Signed)
Spoke with pt and he is aware, states it may take him about an hour as he had to get someone to bring him.

## 2020-08-10 DIAGNOSIS — K279 Peptic ulcer, site unspecified, unspecified as acute or chronic, without hemorrhage or perforation: Secondary | ICD-10-CM

## 2020-08-10 DIAGNOSIS — I482 Chronic atrial fibrillation, unspecified: Secondary | ICD-10-CM | POA: Diagnosis not present

## 2020-08-10 DIAGNOSIS — K921 Melena: Secondary | ICD-10-CM | POA: Diagnosis not present

## 2020-08-10 LAB — CBC
HCT: 45 % (ref 39.0–52.0)
Hemoglobin: 14.4 g/dL (ref 13.0–17.0)
MCH: 33.2 pg (ref 26.0–34.0)
MCHC: 32 g/dL (ref 30.0–36.0)
MCV: 103.7 fL — ABNORMAL HIGH (ref 80.0–100.0)
Platelets: 183 10*3/uL (ref 150–400)
RBC: 4.34 MIL/uL (ref 4.22–5.81)
RDW: 14.4 % (ref 11.5–15.5)
WBC: 6.2 10*3/uL (ref 4.0–10.5)
nRBC: 0 % (ref 0.0–0.2)

## 2020-08-10 LAB — BASIC METABOLIC PANEL
Anion gap: 8 (ref 5–15)
BUN: 11 mg/dL (ref 8–23)
CO2: 24 mmol/L (ref 22–32)
Calcium: 8.8 mg/dL — ABNORMAL LOW (ref 8.9–10.3)
Chloride: 106 mmol/L (ref 98–111)
Creatinine, Ser: 0.8 mg/dL (ref 0.61–1.24)
GFR, Estimated: >60 mL/min (ref >60)
Glucose, Bld: 89 mg/dL (ref 70–99)
Potassium: 3.4 mmol/L — ABNORMAL LOW (ref 3.5–5.1)
Sodium: 138 mmol/L (ref 135–145)

## 2020-08-10 MED ORDER — METOPROLOL TARTRATE 25 MG PO TABS
25.0000 mg | ORAL_TABLET | Freq: Two times a day (BID) | ORAL | Status: DC
  Administered 2020-08-10: 25 mg via ORAL
  Filled 2020-08-10: qty 1

## 2020-08-10 MED ORDER — METOPROLOL TARTRATE 100 MG PO TABS
50.0000 mg | ORAL_TABLET | Freq: Two times a day (BID) | ORAL | Status: DC
Start: 2020-08-10 — End: 2020-08-11

## 2020-08-10 MED ORDER — SODIUM CHLORIDE 0.9 % IV SOLN: INTRAVENOUS | Status: DC

## 2020-08-10 MED ORDER — OMEPRAZOLE 40 MG PO CPDR: 40.0000 mg | DELAYED_RELEASE_CAPSULE | Freq: Two times a day (BID) | ORAL | 0 refills | Status: DC

## 2020-08-10 MED ORDER — ONDANSETRON HCL 4 MG/2ML IJ SOLN: 4.0000 mg | Freq: Four times a day (QID) | INTRAMUSCULAR | Status: DC | PRN

## 2020-08-10 MED ORDER — ACETAMINOPHEN 325 MG PO TABS: 650.0000 mg | ORAL_TABLET | Freq: Four times a day (QID) | ORAL | Status: DC | PRN

## 2020-08-10 MED ORDER — POTASSIUM CHLORIDE 10 MEQ/100ML IV SOLN
10.0000 meq | INTRAVENOUS | Status: AC
  Administered 2020-08-10 (×2): 10 meq via INTRAVENOUS
  Filled 2020-08-10: qty 100

## 2020-08-10 NOTE — Consult Note (Signed)
Consultation  Referring Provider: Dr. Starla Link    Primary Care Physician:  [name], MD Primary Gastroenterologist:    Dr. Hilarie Fredrickson     Reason for Consultation: Melena             HPI:   Glen [name] is a 86 y.o. male with a past medical history of significant upper GI bleed, peptic ulcer disease with H. pylori, who presented to the [facility] long ED from his home on 08/09/2020 after speaking with our office due to a large black tarry stool.    Today, patient is found in his room with his son by his side who does assist with history, patient explains that he has been having a little bit of dark stool ever since time of his last endoscopy but yesterday morning had a very large tarry looking bowel movement and he got concerned about this.  Tells me that he also had a twinge of epigastric pain this morning.  He is hard to follow on whether or not he has actually been taking his Pantoprazole because he describes a side effect of possible hallucinations "ever since my last hospital stay".  His son tells me he is not sure if it is from the medicine or just being in the hospital.  Regardless patient I think had stopped his Pantoprazole.  He did take all of his medication for H. pylori and has a scheduled follow-up in our office in December.    Denies any nausea, vomiting, heartburn, reflux or symptoms that awaken him from sleep.  ED course: Apparently Protonix was held because patient stated it gave him visual hallucinations, hgb stable  GI history: 07/14/2020 EGD with Dr. Hilarie Fredrickson - Tortuous esophagus likely in keeping with esophageal dysmotility/presbyesophagus. - Mild reflux esophagitis with no bleeding and focus of nodularity at GE junction. Biopsied. - Gastritis with no bleeding. Biopsied. - Duodenal ulcer with red spot (source of recent upper GI bleeding). Clip was placed. - Normal second portion of the duodenum  Pathology showed chronic active gastritis with H. pylori and glandular mucosa  with intestinal metaplasia at the GE junction  Past Medical History:  Diagnosis Date  . Atrial fibrillation ([place])   . Diverticulitis   . Family history of diabetes insipidus   . FHx: colon cancer   . History of colonic polyps   . Hyperlipidemia   . Hypertension   . Neoplasm of prostate    special screening malignant   . Obesity   . Palpitation     Past Surgical History:  Procedure Laterality Date  . 2-D Echocardiogram  November 2009, 2011  . Anal Fistula Repair    . BIOPSY  07/14/2020   Procedure: BIOPSY;  Surgeon: [name], MD;  Location: [facility] ENDOSCOPY;  Service: Gastroenterology;;  . CARDIAC VALVE SURGERY  1996   status post balloon valvuloplasty at [facility]  . CATARACT EXTRACTION  2004  . COLONOSCOPY  December 2005  . ESOPHAGOGASTRODUODENOSCOPY  07/14/2020  . ESOPHAGOGASTRODUODENOSCOPY (EGD) WITH PROPOFOL N/A 07/14/2020   Procedure: ESOPHAGOGASTRODUODENOSCOPY (EGD) WITH PROPOFOL;  Surgeon: [name], MD;  Location: [place];  Service: Gastroenterology;  Laterality: N/A;  . ETT  1991   Negative  . HEMOSTASIS CLIP PLACEMENT  07/14/2020   HEMOSTASIS CLIP PLACEMENT  . HEMOSTASIS CLIP PLACEMENT  07/14/2020   Procedure: HEMOSTASIS CLIP PLACEMENT;  Surgeon: [name], MD;  Location: [facility] ENDOSCOPY;  Service: Gastroenterology;;  . KNEE ARTHROSCOPY     Right  . TONSILLECTOMY  Family History  Problem Relation Age of Onset  . Heart attack Father   . Colon cancer Other   . Diabetes Other   . Prostate cancer Brother   . Heart disease Other   . Coronary artery disease Other        Two siblings, status post stenting to siblings with heart disease. One. Status post pacemaker insertion.  . Stroke Brother      Social History   Tobacco Use  . Smoking status: Never Smoker  . Smokeless tobacco: Never Used  Vaping Use  . Vaping Use: Never used  Substance Use Topics  . Alcohol use: No  . Drug use: Never    Prior to Admission medications   Medication Sig  Start Date End Date Taking? Authorizing Provider  acetaminophen (TYLENOL) 500 MG tablet Take 500-1,500 mg by mouth 2 (two) times daily as needed for moderate pain.    Yes [provider]  ferrous sulfate 325 (65 FE) MG EC tablet Take 1 tablet (325 mg total) by mouth 2 (two) times daily. 07/24/20 01/20/21 Yes Mercy Riding, MD  metoprolol tartrate (LOPRESSOR) 100 MG tablet Take 1 tablet (100 mg total) by mouth 2 (two) times daily. 07/23/20  Yes Troy Sine, MD  ondansetron (ZOFRAN) 4 MG tablet Take 1 tablet (4 mg total) by mouth every 8 (eight) hours as needed for nausea or vomiting. 07/17/20 07/17/21 Yes Mercy Riding, MD  pantoprazole (PROTONIX) 40 MG tablet Take 1 tablet (40 mg total) by mouth 2 (two) times daily for 60 days, THEN 1 tablet (40 mg total) daily. 07/17/20 12/14/20 Yes Mercy Riding, MD  polyethylene glycol powder (MIRALAX) 17 GM/SCOOP powder Take 17 g by mouth 2 (two) times daily as needed for moderate constipation. 07/17/20  Yes Mercy Riding, MD    Current Facility-Administered Medications  Medication Dose Route Frequency Provider Last Rate Last Admin  . 0.9 %  sodium chloride infusion   Intravenous Continuous Kayleen Memos, DO 50 mL/hr at 08/10/20 0458 New Bag at 08/10/20 0458  . acetaminophen (TYLENOL) tablet 650 mg  650 mg Oral Q6H PRN Irene Pap N, DO      . metoprolol tartrate (LOPRESSOR) tablet 25 mg  25 mg Oral BID Hall, Carole N, DO      . ondansetron [facility]) injection 4 mg  4 mg Intravenous Q6H PRN Irene Pap N, DO      . potassium chloride 10 mEq in 100 mL IVPB  10 mEq Intravenous Q1 Hr x 2 Aline August, MD        Allergies as of 08/09/2020 - Review Complete 08/09/2020  Allergen Reaction Noted  . Protonix [pantoprazole] Other (See Comments) 08/09/2020  . Codeine Other (See Comments) 07/31/2007     Review of Systems:    Constitutional: No weight loss, fever or chills Skin: No rash  Cardiovascular: No chest pain, chest pressure or palpitations    Respiratory: No SOB  Gastrointestinal: See HPI and otherwise negative Genitourinary: No dysuria  Neurological: No headache, dizziness or syncope Musculoskeletal: No new muscle or joint pain Hematologic: No  bruising Psychiatric: No history of depression or anxiety    Physical Exam:  Vital signs in last 24 hours: Temp:  [97.5 F (36.4 C)-98.3 F (36.8 C)] 98.2 F (36.8 C) (11/09 0505) Pulse Rate:  [61-99] 78 (11/09 0505) Resp:  [11-26] 18 (11/09 0505) BP: (99-156)/(58-102) 124/76 (11/09 0505) SpO2:  [94 %-100 %] 97 % (11/09 0505) Weight:  [97 kg-97.5 kg] 97  kg (11/09 0049) Last BM Date: 08/09/20 General:   Pleasant elderly Caucasian male appears to be in NAD, Well developed, Well nourished, alert and cooperative Head:  Normocephalic and atraumatic. Eyes:   PEERL, EOMI. No icterus. Conjunctiva pink. Ears:  Normal auditory acuity. Neck:  Supple Throat: Oral cavity and pharynx without inflammation, swelling or lesion.  Lungs: Respirations even and unlabored. Lungs clear to auscultation bilaterally.   No wheezes, crackles, or rhonchi.  Heart: Normal S1, S2. No MRG. Regular rate and rhythm. No peripheral edema, cyanosis or pallor.  Abdomen:  Soft, nondistended, nontender. No rebound or guarding. Normal bowel sounds. No appreciable masses or hepatomegaly. Rectal:  Not performed.  Msk:  Symmetrical without gross deformities. Peripheral pulses intact.  Extremities:  Without edema, no deformity or joint abnormality.  Neurologic:  Alert and  oriented x4;  grossly normal neurologically.  Skin:   Dry and intact without significant lesions or rashes. Psychiatric: Demonstrates good judgement and reason without abnormal affect or behaviors.   LAB RESULTS: Recent Labs    08/09/20 1249 08/10/20 0604  WBC 6.6 6.2  HGB 14.1 14.4  HCT 42.7 45.0  PLT 203 183   BMET Recent Labs    08/09/20 1249 08/10/20 0604  NA 135 138  K 3.7 3.4*  CL 106 106  CO2 21* 24  GLUCOSE 149* 89  BUN 13  11  CREATININE 0.90 0.80  CALCIUM 8.3* 8.8*   LFT Recent Labs    08/09/20 1249  PROT 6.1*  ALBUMIN 3.7  AST 24  ALT 20  ALKPHOS 72  BILITOT 0.7    Impression / Plan:   Impression: 1.  Melena: History of recent upper GI bleed with gastritis and PUD with H. Pylori, hemoglobin stable overnight, no further reports of melena in fact no bowel movement overnight, BUN normal; very low suspicion for active upper GI bleed 2.  PUD with H. Pylori: finished two weeks of quadruple therapy on Oct 30th 3.  Chronic A. fib not on anticoagulation 4.  Iron deficiency anemia  Plan: 1.  Would recommend the patient stay on a PPI twice daily outpatient.  We can change him to something other than Pantoprazole.  Perhaps Omeprazole 40 mg twice daily, 30-60 minutes before breakfast and dinner.  Would recommend that he get enough of this medication so that it can told him over until he is seen in our clinic next month on 09/14/20 2.  Discussed with patient that I have very low suspicion that he has an active upper GI bleed given his normal labs and no further bowel movements. 3.  Did discuss case with Dr. Tarri Glenn who is in agreement, she will pass by to see the patient this morning and he can hopefully be discharged today.  Thank you for your kind consultation, we will sign off.    Lavone Nian [facility]  08/10/2020, 8:39 AM

## 2020-08-10 NOTE — Discharge Summary (Signed)
Physician Discharge Summary  Glen [name] ION:[id number] DOB: 1928-05-18 DOA: 08/09/2020  PCP: [name], MD  Admit date: 08/09/2020 Discharge date: 08/10/2020  Admitted From: Home Disposition: Home  Recommendations for Outpatient Follow-up:  1. Follow up with PCP in 1 week with repeat CBC/BMP 2. Outpatient follow-up with GI 3. Follow up in ED if symptoms worsen or new appear   Home Health: No Equipment/Devices: None  Discharge Condition: Stable CODE STATUS: DNR Diet recommendation: Heart healthy Eat slowly, take small bites, chew well, and take a sip of water between bites of food as per GI recommendations  Brief/Interim Summary: 86 year old male with history of GI bleed, chronic A. fib not on anticoagulation, peptic ulcer disease with H. pylori infection treated with antibiotics presented with nausea and black tarry stools and was seen at GI providers office who recommended admission.  Hemoglobin was 14 on presentation.  During the hospitalization, GI has evaluated the patient.  Patient has remained stable with stable hemoglobin.  GI has cleared the patient for discharge with outpatient follow-up.  Will be discharged home today.  Discharge Diagnoses:   Melena with recent history of upper GI bleeding/gastritis/peptic ulcer disease with H. Pylori Iron deficiency anemia -Patient has completed antibiotic treatment for H. pylori recently.  Patient presented with melena and GI doctor's office who recommended hospitalization and observation.  Hemoglobin was 14 on presentation.  Hemoglobin 14.4 today.  No further episodes of hematochezia/melena. -GI evaluation appreciated.  Patient states that he has hallucinations to Protonix.  GI suggesting that he be started on omeprazole.  GI has cleared the patient for discharge. -Discharge home on omeprazole 40 mg twice a day.  Outpatient follow-up with GI.  Monitor CBC as an outpatient -Continue iron supplementation  Chronic A. fib, not on  anticoagulation -Currently rate controlled.  On metoprolol 100 mg twice a day at home.  Blood pressure on the lower side.  Will decrease metoprolol to 50 mg twice a day upon discharge.  Follow-up with PCP as an outpatient  Discharge Instructions  Discharge Instructions    Ambulatory referral to Gastroenterology   Complete by: As directed    Hospital follow-up   Diet - low sodium heart healthy   Complete by: As directed    eat slowly, take small bites, chew well, and take a sip of water between bites of food as per GI recommendations   Increase activity slowly   Complete by: As directed      Allergies as of 08/10/2020      Reactions   Protonix [pantoprazole] Other (See Comments)   Hallucinations   Codeine Other (See Comments)   constpation      Medication List    STOP taking these medications   pantoprazole 40 MG tablet Commonly known as: PROTONIX     TAKE these medications   acetaminophen 500 MG tablet Commonly known as: TYLENOL Take 500-1,500 mg by mouth 2 (two) times daily as needed for moderate pain.   ferrous sulfate 325 (65 FE) MG EC tablet Take 1 tablet (325 mg total) by mouth 2 (two) times daily.   metoprolol tartrate 100 MG tablet Commonly known as: LOPRESSOR Take 0.5 tablets (50 mg total) by mouth 2 (two) times daily. What changed: how much to take   omeprazole 40 MG capsule Commonly known as: PRILOSEC Take 1 capsule (40 mg total) by mouth 2 (two) times daily.   ondansetron 4 MG tablet Commonly known as: Zofran Take 1 tablet (4 mg total) by mouth every 8 (  eight) hours as needed for nausea or vomiting.   polyethylene glycol powder 17 GM/SCOOP powder Commonly known as: MiraLax Take 17 g by mouth 2 (two) times daily as needed for moderate constipation.       Allergies  Allergen Reactions  . Protonix [Pantoprazole] Other (See Comments)    Hallucinations  . Codeine Other (See Comments)    constpation     Consultations:  GI   Procedures/Studies: DG Ankle 2 Views Right  Result Date: 07/12/2020 CLINICAL DATA:  Palpitations. EXAM: RIGHT ANKLE - 2 VIEW COMPARISON:  None. FINDINGS: There is no evidence of fracture, dislocation, or joint effusion. Plantar and posterior calcaneal spurs. Well-corticated ossific density inferior to the anterior aspect of the calcaneus on lateral view likely related to old fractures. There is no evidence of severe arthropathy or aggressive appearing focal bone abnormality. Diffuse mild subcutaneus soft tissue edema. Vascular calcifications. IMPRESSION: No acute displaced fracture or dislocation of the bones of the right ankle on this two view radiograph. Electronically Signed   By: Iven Finn M.D.   On: 07/12/2020 22:58   CT Head Wo Contrast  Result Date: 07/13/2020 CLINICAL DATA:  Head trauma, minor. Additional history provided: Emesis/fall. EXAM: CT HEAD WITHOUT CONTRAST TECHNIQUE: Contiguous axial images were obtained from the base of the skull through the vertex without intravenous contrast. COMPARISON:  Head CT 11/08/2018. FINDINGS: Brain: Mild generalized cerebral atrophy. Mild ill-defined hypoattenuation within the cerebral white matter is nonspecific, but consistent with chronic small vessel ischemic disease. There is no acute intracranial hemorrhage. No demarcated cortical infarct. No extra-axial fluid collection. No evidence of intracranial mass. No midline shift. Vascular: No hyperdense vessel.  Atherosclerotic calcifications. Skull: Normal. Negative for fracture or focal lesion. Sinuses/Orbits: Visualized orbits show no acute finding. Mild ethmoid and maxillary sinus mucosal thickening. No significant mastoid effusion. IMPRESSION: No evidence of acute intracranial abnormality. Mild generalized cerebral atrophy and chronic small vessel ischemic disease, stable as compared to the head CT of 11/08/2018. Mild ethmoid and maxillary sinus mucosal thickening.  Electronically Signed   By: Kellie Simmering DO   On: 07/13/2020 13:42   DG Chest Portable 1 View  Result Date: 07/12/2020 CLINICAL DATA:  Palpitation EXAM: PORTABLE CHEST 1 VIEW COMPARISON:  Chest x-ray 11/18/2015 FINDINGS: The heart size and mediastinal contours are unchanged. Aortic arch calcification. Similar-appearing left lower lobe linear atelectasis versus more likely scarring. No focal consolidation. No pulmonary edema. No pleural effusion. No pneumothorax. No acute osseous abnormality. Multilevel degenerative changes of the spine. Degenerative changes of bilateral acromioclavicular joints. Right neck subcutaneus soft tissue asymmetry compared to the left. Vascular calcifications within the neck. IMPRESSION: 1. Right neck subcutaneus soft tissue asymmetry compared to the left. Correlate with physical exam to exclude underlying mass. Findings could be due to overlying soft tissues or something external to the patient. 2. No acute cardiopulmonary abnormality. Electronically Signed   By: Iven Finn M.D.   On: 07/12/2020 22:55       Subjective: Patient seen and examined at bedside.  Slightly hard of hearing.  Denies any overnight black or bloody stools.  No fever, vomiting or shortness of breath reported. Discharge Exam: Vitals:   08/10/20 0848 08/10/20 1227  BP: 118/87 129/83  Pulse: 75 84  Resp: (!) 22 20  Temp: 97.6 F (36.4 C) 97.6 F (36.4 C)  SpO2: 96% 98%    General: Pt is alert, awake, not in acute distress.  Elderly male lying in bed. Cardiovascular: rate controlled, S1/S2 + Respiratory: bilateral decreased  breath sounds at bases with some scattered crackles Abdominal: Soft, NT, ND, bowel sounds + Extremities: Trace lower extremity edema; no cyanosis    The results of significant diagnostics from this hospitalization (including imaging, microbiology, ancillary and laboratory) are listed below for reference.     Microbiology: No results found for this or any  previous visit (from the past 240 hour(s)).   Labs: BNP (last 3 results) No results for input(s): BNP in the last 8760 hours. Basic Metabolic Panel: Recent Labs  Lab 08/09/20 1249 08/10/20 0604  NA 135 138  K 3.7 3.4*  CL 106 106  CO2 21* 24  GLUCOSE 149* 89  BUN 13 11  CREATININE 0.90 0.80  CALCIUM 8.3* 8.8*   Liver Function Tests: Recent Labs  Lab 08/09/20 1249  AST 24  ALT 20  ALKPHOS 72  BILITOT 0.7  PROT 6.1*  ALBUMIN 3.7   Recent Labs  Lab 08/09/20 1249  LIPASE 31   No results for input(s): AMMONIA in the last 168 hours. CBC: Recent Labs  Lab 08/09/20 1249 08/10/20 0604  WBC 6.6 6.2  HGB 14.1 14.4  HCT 42.7 45.0  MCV 100.9* 103.7*  PLT 203 183   Cardiac Enzymes: No results for input(s): CKTOTAL, CKMB, CKMBINDEX, TROPONINI in the last 168 hours. BNP: Invalid input(s): POCBNP CBG: No results for input(s): GLUCAP in the last 168 hours. D-Dimer No results for input(s): DDIMER in the last 72 hours. Hgb A1c No results for input(s): HGBA1C in the last 72 hours. Lipid Profile No results for input(s): CHOL, HDL, LDLCALC, TRIG, CHOLHDL, LDLDIRECT in the last 72 hours. Thyroid function studies No results for input(s): TSH, T4TOTAL, T3FREE, THYROIDAB in the last 72 hours.  Invalid input(s): FREET3 Anemia work up No results for input(s): VITAMINB12, FOLATE, FERRITIN, TIBC, IRON, RETICCTPCT in the last 72 hours. Urinalysis    Component Value Date/Time   COLORURINE YELLOW 08/09/2020 1226   APPEARANCEUR CLEAR 08/09/2020 1226   LABSPEC 1.014 08/09/2020 1226   PHURINE 5.0 08/09/2020 1226   GLUCOSEU NEGATIVE 08/09/2020 1226   HGBUR NEGATIVE 08/09/2020 1226   BILIRUBINUR NEGATIVE 08/09/2020 1226   KETONESUR NEGATIVE 08/09/2020 1226   PROTEINUR NEGATIVE 08/09/2020 1226   UROBILINOGEN 0.2 05/27/2013 0029   NITRITE NEGATIVE 08/09/2020 1226   LEUKOCYTESUR NEGATIVE 08/09/2020 1226   Sepsis Labs Invalid input(s): PROCALCITONIN,  WBC,   LACTICIDVEN Microbiology No results found for this or any previous visit (from the past 240 hour(s)).   Time coordinating discharge: 35 minutes  SIGNED:   Aline August, MD  [facility] 08/10/2020, 1:36 PM

## 2020-08-10 NOTE — Care Management Obs Status (Signed)
[place] NOTIFICATION   Patient Details  Name: Glen [name] MRN: [id number] Date of Birth: 07-Dec-1927   Medicare Observation Status Notification Given:  Yes    MahabirJuliann Pulse, RN 08/10/2020, 12:48 PM

## 2020-08-10 NOTE — Discharge Instructions (Signed)
Follow verbal recommendations made by the Gastrointestinal doctor.

## 2020-08-10 NOTE — Plan of Care (Signed)

## 2020-08-11 ENCOUNTER — Telehealth: Payer: Self-pay | Admitting: Internal Medicine

## 2020-08-11 ENCOUNTER — Telehealth: Payer: Self-pay | Admitting: Cardiovascular Disease

## 2020-08-11 MED ORDER — METOPROLOL TARTRATE 100 MG PO TABS: 100.0000 mg | ORAL_TABLET | Freq: Two times a day (BID) | ORAL | Status: DC

## 2020-08-11 NOTE — Telephone Encounter (Signed)
Patient contacted cardiology service due to increased heart rate after his metoprolol was reduced from the previous 100 mg twice daily dosing down to 50 mg twice daily dosing.  Upon further review of his record, looks like the patient was admitted to the hospital this past Sunday due to melena in the stool, fortunately his hemoglobin did not decrease and remained very much normal.  During the hospitalization, his metoprolol was cut back because of borderline low blood pressure.  Since discharge, he has noticed increased heart rate.  This is not very surprising considering the patient is in permanent atrial fibrillation and given on the 100 mg twice daily dosing of metoprolol, his heart rate has always been in the upper 90s.  Unfortunately he cannot check his blood pressure right now because his wife is taken a nap and he does not know where to get the blood pressure machine.  I plan to call the patient back in 1 hour after his wife wakes up, if his systolic blood pressure is greater than 110, I plan to increase his metoprolol tartrate back up to 100 mg twice daily dosing.

## 2020-08-11 NOTE — Telephone Encounter (Signed)
I spoke with the patient, BP 113/75. Will increase metoprolol tartrate back up to 100mg  BID dosing. He will monitor his BP for the next few days and call cardiology if SBP is persistently < 95 mmHg.

## 2020-08-11 NOTE — Addendum Note (Signed)
Addended byEulas Post, Lydon Vansickle on: 08/11/2020 03:17 PM   Modules accepted: Orders

## 2020-08-11 NOTE — Telephone Encounter (Signed)
Patient calling in to report that he feels like his heart is racing today. He reports he was told to decrease his metoprolol to 50mg  BID from 100mg  BID and he took the decreased dose this morning and doesn't think it is helping. He is not sure what his heart rate or blood pressure is and he is not able to check it. He is not sure why the dose was changed either.   Will route to covering triage provider for recommendations.

## 2020-08-11 NOTE — Telephone Encounter (Signed)
Pt c/o medication issue:  1. Name of Medication: metoprolol tartrate (LOPRESSOR) 100 MG tablet  2. How are you currently taking this medication (dosage and times per day)?  As directed  3. Are you having a reaction (difficulty breathing--STAT)? No  4. What is your medication issue? He states when he was in the hospital he was advised to reduce medication intake from 1 tablet to half a tablet daily. He states taking the half a tablet daily is not helping and he would like to go back to taking 1 tablet daily. Please advise.

## 2020-08-11 NOTE — Telephone Encounter (Signed)
Pts metoprolol was decreased from 100mg  BID to 50mg  BID while in the hospital. Pt thinks he needs to increase back up to 100mg  BID. Discussed with him that we cannot tell him to do this. He has called his cardiologist and discussed with him that he needs to wait until he hears back from cardiology regarding increasing the medication.

## 2020-08-12 DIAGNOSIS — K922 Gastrointestinal hemorrhage, unspecified: Secondary | ICD-10-CM | POA: Diagnosis not present

## 2020-08-12 DIAGNOSIS — M1990 Unspecified osteoarthritis, unspecified site: Secondary | ICD-10-CM | POA: Diagnosis not present

## 2020-08-12 DIAGNOSIS — I482 Chronic atrial fibrillation, unspecified: Secondary | ICD-10-CM | POA: Diagnosis not present

## 2020-08-12 DIAGNOSIS — I10 Essential (primary) hypertension: Secondary | ICD-10-CM | POA: Diagnosis not present

## 2020-08-12 DIAGNOSIS — E785 Hyperlipidemia, unspecified: Secondary | ICD-10-CM | POA: Diagnosis not present

## 2020-08-12 DIAGNOSIS — D649 Anemia, unspecified: Secondary | ICD-10-CM | POA: Diagnosis not present

## 2020-08-12 NOTE — Telephone Encounter (Signed)
Agree, thanks

## 2020-08-18 DIAGNOSIS — I482 Chronic atrial fibrillation, unspecified: Secondary | ICD-10-CM | POA: Diagnosis not present

## 2020-08-18 DIAGNOSIS — E785 Hyperlipidemia, unspecified: Secondary | ICD-10-CM | POA: Diagnosis not present

## 2020-08-18 DIAGNOSIS — I10 Essential (primary) hypertension: Secondary | ICD-10-CM | POA: Diagnosis not present

## 2020-08-18 DIAGNOSIS — Z9181 History of falling: Secondary | ICD-10-CM | POA: Diagnosis not present

## 2020-08-18 DIAGNOSIS — M1990 Unspecified osteoarthritis, unspecified site: Secondary | ICD-10-CM | POA: Diagnosis not present

## 2020-08-18 DIAGNOSIS — Z8546 Personal history of malignant neoplasm of prostate: Secondary | ICD-10-CM | POA: Diagnosis not present

## 2020-08-18 DIAGNOSIS — K5790 Diverticulosis of intestine, part unspecified, without perforation or abscess without bleeding: Secondary | ICD-10-CM | POA: Diagnosis not present

## 2020-08-18 DIAGNOSIS — K922 Gastrointestinal hemorrhage, unspecified: Secondary | ICD-10-CM | POA: Diagnosis not present

## 2020-08-18 DIAGNOSIS — D649 Anemia, unspecified: Secondary | ICD-10-CM | POA: Diagnosis not present

## 2020-08-30 ENCOUNTER — Encounter: Payer: Self-pay | Admitting: Family Medicine

## 2020-08-30 DIAGNOSIS — I10 Essential (primary) hypertension: Secondary | ICD-10-CM | POA: Diagnosis not present

## 2020-08-30 DIAGNOSIS — I482 Chronic atrial fibrillation, unspecified: Secondary | ICD-10-CM | POA: Diagnosis not present

## 2020-08-30 DIAGNOSIS — K922 Gastrointestinal hemorrhage, unspecified: Secondary | ICD-10-CM | POA: Diagnosis not present

## 2020-08-30 DIAGNOSIS — M1990 Unspecified osteoarthritis, unspecified site: Secondary | ICD-10-CM | POA: Diagnosis not present

## 2020-08-30 DIAGNOSIS — D649 Anemia, unspecified: Secondary | ICD-10-CM | POA: Diagnosis not present

## 2020-08-30 DIAGNOSIS — E785 Hyperlipidemia, unspecified: Secondary | ICD-10-CM | POA: Diagnosis not present

## 2020-08-30 NOTE — Telephone Encounter (Signed)
Please advise 

## 2020-09-07 DIAGNOSIS — I10 Essential (primary) hypertension: Secondary | ICD-10-CM | POA: Diagnosis not present

## 2020-09-07 DIAGNOSIS — K922 Gastrointestinal hemorrhage, unspecified: Secondary | ICD-10-CM | POA: Diagnosis not present

## 2020-09-07 DIAGNOSIS — M1990 Unspecified osteoarthritis, unspecified site: Secondary | ICD-10-CM | POA: Diagnosis not present

## 2020-09-07 DIAGNOSIS — D649 Anemia, unspecified: Secondary | ICD-10-CM | POA: Diagnosis not present

## 2020-09-07 DIAGNOSIS — E785 Hyperlipidemia, unspecified: Secondary | ICD-10-CM | POA: Diagnosis not present

## 2020-09-07 DIAGNOSIS — I482 Chronic atrial fibrillation, unspecified: Secondary | ICD-10-CM | POA: Diagnosis not present

## 2020-09-09 ENCOUNTER — Encounter: Payer: Self-pay | Admitting: *Deleted

## 2020-09-14 ENCOUNTER — Other Ambulatory Visit (INDEPENDENT_AMBULATORY_CARE_PROVIDER_SITE_OTHER): Payer: Medicare Other

## 2020-09-14 ENCOUNTER — Ambulatory Visit (INDEPENDENT_AMBULATORY_CARE_PROVIDER_SITE_OTHER): Payer: Medicare Other | Admitting: Internal Medicine

## 2020-09-14 ENCOUNTER — Encounter: Payer: Self-pay | Admitting: Internal Medicine

## 2020-09-14 VITALS — BP 128/80 | HR 82 | Ht 72.0 in | Wt 218.0 lb

## 2020-09-14 DIAGNOSIS — Z8619 Personal history of other infectious and parasitic diseases: Secondary | ICD-10-CM | POA: Diagnosis not present

## 2020-09-14 DIAGNOSIS — K2289 Other specified disease of esophagus: Secondary | ICD-10-CM | POA: Diagnosis not present

## 2020-09-14 DIAGNOSIS — K5909 Other constipation: Secondary | ICD-10-CM | POA: Diagnosis not present

## 2020-09-14 DIAGNOSIS — K264 Chronic or unspecified duodenal ulcer with hemorrhage: Secondary | ICD-10-CM | POA: Diagnosis not present

## 2020-09-14 LAB — CBC WITH DIFFERENTIAL/PLATELET
Basophils Absolute: 0.1 10*3/uL (ref 0.0–0.1)
Basophils Relative: 1.1 % (ref 0.0–3.0)
Eosinophils Absolute: 0.2 10*3/uL (ref 0.0–0.7)
Eosinophils Relative: 2 % (ref 0.0–5.0)
HCT: 46.2 % (ref 39.0–52.0)
Hemoglobin: 15.5 g/dL (ref 13.0–17.0)
Lymphocytes Relative: 21.5 % (ref 12.0–46.0)
Lymphs Abs: 1.8 10*3/uL (ref 0.7–4.0)
MCHC: 33.5 g/dL (ref 30.0–36.0)
MCV: 96.6 fl (ref 78.0–100.0)
Monocytes Absolute: 0.6 10*3/uL (ref 0.1–1.0)
Monocytes Relative: 7.6 % (ref 3.0–12.0)
Neutro Abs: 5.5 10*3/uL (ref 1.4–7.7)
Neutrophils Relative %: 67.8 % (ref 43.0–77.0)
Platelets: 208 10*3/uL (ref 150.0–400.0)
RBC: 4.79 Mil/uL (ref 4.22–5.81)
RDW: 13.5 % (ref 11.5–15.5)
WBC: 8.2 10*3/uL (ref 4.0–10.5)

## 2020-09-14 LAB — IBC + FERRITIN
Ferritin: 54.8 ng/mL (ref 22.0–322.0)
Iron: 71 ug/dL (ref 42–165)
Saturation Ratios: 22.5 % (ref 20.0–50.0)
Transferrin: 225 mg/dL (ref 212.0–360.0)

## 2020-09-14 NOTE — Patient Instructions (Signed)
If you are age 84 or older, your body mass index should be between 23-30. Your Body mass index is 29.57 kg/m. If this is out of the aforementioned range listed, please consider follow up with your Primary Care Provider.  If you are age 64 or younger, your body mass index should be between 19-25. Your Body mass index is 29.57 kg/m. If this is out of the aformentioned range listed, please consider follow up with your Primary Care Provider.    You have been scheduled for an endoscopy. Please follow written instructions given to you at your visit today. If you use inhalers (even only as needed), please bring them with you on the day of your procedure.  Your provider has requested that you go to the basement level for lab work before leaving today. Press "B" on the elevator. The lab is located at the first door on the left as you exit the elevator.   It was great seeing you today!  Thank you for entrusting me with your care and choosing [facility].  Dr. Hilarie Fredrickson

## 2020-09-14 NOTE — Progress Notes (Signed)
Subjective:    Patient ID: Glen [name], male    DOB: [birth year], 86 y.o.   MRN: [id number]  HPI Glen [name] is a 86 year old male with recent hospitalization with bleeding duodenal ulcer related H. pylori, hypertension, hyperlipidemia, history of prostate cancer, prior diverticulitis who is here for hospital follow-up.  He is here today with his son.  I saw him on initial consultation in mid October where he had melena and was found to have a bleeding duodenal ulcer treated with hemostatic clip.  At that time he was also having epigastric abdominal pain.  Biopsies showed H. pylori and he was treated with twice daily PPI and completed triple therapy for H. pylori.  He came back to the hospital on 08/10/2020 with concern of melena.  The GI consult team saw the patient and he was noted to be hemodynamically stable with normal hemoglobin and normal BUN.  It was felt likely that oral iron was contributing to his black stools and repeat upper endoscopy was not performed.  Today he reports that he is feeling well.  He was taking ferrous sulfate but developed a rash which was itchy on his abdomen and lower extremities.  He stopped ferrous sulfate and the rash resolved.  Also dark stools resolved with stopping oral iron.  He has had no further abdominal pain.  Not a great appetite but is eating 3 meals a day without early satiety.  He does eat a small dinner.  No nausea or vomiting.  Bowel movements have been regular with MiraLAX 1-2 times daily.  Energy levels have been at baseline.  He has not had recent issues with trouble swallowing or dysphagia.  He is not currently taking PPI.  His only medication is metoprolol and MiraLAX.  He is not using NSAIDs or baby aspirin and does occasionally use Tylenol for arthritic pain.   Review of Systems As per HPI, otherwise negative  Current Medications, Allergies, Past Medical History, Past Surgical History, Family History and Social History were reviewed in  [employer] record.     Objective:   Physical Exam BP 128/80    Pulse 82    Ht 6' (1.829 m)    Wt 218 lb (98.9 kg)    BMI 29.57 kg/m  Gen: awake, alert, NAD HEENT: anicteric  CV: RRR, no mrg Pulm: CTA b/l Abd: soft, NT/ND, +BS throughout Ext: no c/c/e Neuro: nonfocal  CBC    Component Value Date/Time   WBC 6.2 08/10/2020 0604   RBC 4.34 08/10/2020 0604   HGB 14.4 08/10/2020 0604   HGB 16.9 12/04/2019 1111   HCT 45.0 08/10/2020 0604   HCT 48.3 12/04/2019 1111   PLT 183 08/10/2020 0604   PLT 229 12/04/2019 1111   MCV 103.7 (H) 08/10/2020 0604   MCV 94 12/04/2019 1111   MCH 33.2 08/10/2020 0604   MCHC 32.0 08/10/2020 0604   RDW 14.4 08/10/2020 0604   RDW 11.9 12/04/2019 1111   LYMPHSABS 2.1 07/31/2017 0945   MONOABS 0.7 07/31/2017 0945   EOSABS 0.2 07/31/2017 0945   BASOSABS 0.1 07/31/2017 0945        Assessment & Plan:  86 year old male with recent hospitalization with bleeding duodenal ulcer related H. pylori, hypertension, hyperlipidemia, history of prostate cancer, prior diverticulitis who is here for hospital follow-up.  1.  Duodenal ulcer associated with H. Pylori --he has been treated for H. pylori and duodenal ulcer and is now off of PPI.  We discussed management and I  recommended we repeat upper endoscopy to confirm healing of his duodenal ulcer and also biopsy the stomach to confirm H. pylori eradication.  We discussed the risk, benefits and alternatives and he is agreeable and wishes to proceed --Repeat EGD in the [facility] --He will remain off PPI for now and continue to avoid NSAIDs  2.  Dysphagia --related to esophageal dysmotility/presbyesophagus.  Symptoms are not troublesome at this time.  3.  Recent GI bleed --he did not tolerate oral iron, I will repeat CBC and iron studies today  4.  Chronic constipation --he is continue MiraLAX 1-2 times daily  30 minutes total spent today including patient facing time, coordination of care,  reviewing medical history/procedures/pertinent radiology studies, and documentation of the encounter.

## 2020-09-17 ENCOUNTER — Encounter: Payer: Self-pay | Admitting: Internal Medicine

## 2020-09-17 ENCOUNTER — Other Ambulatory Visit: Payer: Self-pay

## 2020-09-17 ENCOUNTER — Ambulatory Visit (AMBULATORY_SURGERY_CENTER): Payer: Medicare Other | Admitting: Internal Medicine

## 2020-09-17 VITALS — BP 110/63 | HR 80 | Temp 97.8°F | Resp 17 | Ht 72.0 in | Wt 218.0 lb

## 2020-09-17 DIAGNOSIS — Z8719 Personal history of other diseases of the digestive system: Secondary | ICD-10-CM | POA: Diagnosis not present

## 2020-09-17 DIAGNOSIS — K222 Esophageal obstruction: Secondary | ICD-10-CM | POA: Diagnosis not present

## 2020-09-17 DIAGNOSIS — K2951 Unspecified chronic gastritis with bleeding: Secondary | ICD-10-CM | POA: Diagnosis not present

## 2020-09-17 DIAGNOSIS — K2970 Gastritis, unspecified, without bleeding: Secondary | ICD-10-CM | POA: Diagnosis not present

## 2020-09-17 DIAGNOSIS — K2980 Duodenitis without bleeding: Secondary | ICD-10-CM | POA: Diagnosis not present

## 2020-09-17 DIAGNOSIS — K264 Chronic or unspecified duodenal ulcer with hemorrhage: Secondary | ICD-10-CM

## 2020-09-17 MED ORDER — SODIUM CHLORIDE 0.9 % IV SOLN: 500.0000 mL | Freq: Once | INTRAVENOUS | Status: DC

## 2020-09-17 NOTE — Op Note (Signed)
[facility] Patient Name: Glen [name] Procedure Date: 09/17/2020 12:07 PM MRN: [id number] Endoscopist: [name] , MD Age: 86 Referring MD:  Date of Birth: 09/14/1928 Gender: Male Account #: [id number] Procedure:                Upper GI endoscopy Indications:              Follow-up of acute duodenal ulcer with hemorrhage                            diagnosed by EGD in Oct 2021 associated with H.                            Pylori infection s/p treatment Medicines:                Monitored Anesthesia Care Procedure:                Pre-Anesthesia Assessment:                           - Prior to the procedure, a History and Physical                            was performed, and patient medications and                            allergies were reviewed. The patient's tolerance of                            previous anesthesia was also reviewed. The risks                            and benefits of the procedure and the sedation                            options and risks were discussed with the patient.                            All questions were answered, and informed consent                            was obtained. Prior Anticoagulants: The patient has                            taken no previous anticoagulant or antiplatelet                            agents. ASA Grade Assessment: III - A patient with                            severe systemic disease. After reviewing the risks                            and benefits, the patient was deemed in  satisfactory condition to undergo the procedure.                           After obtaining informed consent, the endoscope was                            passed under direct vision. Throughout the                            procedure, the patient's blood pressure, pulse, and                            oxygen saturations were monitored continuously. The                            Endoscope was introduced  through the mouth, and                            advanced to the second part of duodenum. The upper                            GI endoscopy was accomplished without difficulty.                            The patient tolerated the procedure well. Scope In: Scope Out: Findings:                 Normal mucosa was found in the entire esophagus.                           A low-grade of narrowing Schatzki's ring was found                            at the gastroesophageal junction.                           Mild inflammation characterized by congestion                            (edema) and erythema was found in the gastric body                            and in the gastric antrum. Biopsies were taken with                            a cold forceps for histology and Helicobacter                            pylori testing.                           Mild inflammation characterized by erythema was                            found in the duodenal bulb.  The previously found                            duodenal ulcer has healed.                           The second portion of the duodenum was normal. Complications:            No immediate complications. Estimated Blood Loss:     Estimated blood loss was minimal. Impression:               - Normal mucosa was found in the entire esophagus.                           - Low-grade of narrowing Schatzki ring.                           - Mild gastritis. Biopsied to exclude persistent H.                            Pylori infection.                           - Mild duodenitis in the bulb. Duodenal ulcer has                            healed.                           - Normal second portion of the duodenum. Recommendation:           - Patient has a contact number available for                            emergencies. The signs and symptoms of potential                            delayed complications were discussed with the                            patient.  Return to normal activities tomorrow.                            Written discharge instructions were provided to the                            patient.                           - Resume previous diet.                           - Continue present medications.                           - Await pathology results. If H. Pylori is positive  then will need retreatment (with [name]). If                            negative consider low dose daily PPI given mild                            gastroduodenitis seen today (to prevent future                            ulceration). [name], MD 09/17/2020 12:27:26 PM This report has been signed electronically.

## 2020-09-17 NOTE — Progress Notes (Signed)
PT taken to PACU. Monitors in place. VSS. Report given to RN. 

## 2020-09-17 NOTE — Progress Notes (Signed)
Called to room to assist during endoscopic procedure.  Patient ID and intended procedure confirmed with present staff. Received instructions for my participation in the procedure from the performing physician.  

## 2020-09-17 NOTE — Patient Instructions (Signed)
Resume previous medications.  Await pathology for final recommendations.    YOU HAD AN ENDOSCOPIC PROCEDURE TODAY AT [facility]:   Refer to the procedure report that was given to you for any specific questions about what was found during the examination.  If the procedure report does not answer your questions, please call your gastroenterologist to clarify.  If you requested that your care partner not be given the details of your procedure findings, then the procedure report has been included in a sealed envelope for you to review at your convenience later.  YOU SHOULD EXPECT: Some feelings of bloating in the abdomen. Passage of more gas than usual.  Walking can help get rid of the air that was put into your GI tract during the procedure and reduce the bloating. If you had a lower endoscopy (such as a colonoscopy or flexible sigmoidoscopy) you may notice spotting of blood in your stool or on the toilet paper. If you underwent a bowel prep for your procedure, you may not have a normal bowel movement for a few days.  Please Note:  You might notice some irritation and congestion in your nose or some drainage.  This is from the oxygen used during your procedure.  There is no need for concern and it should clear up in a day or so.  SYMPTOMS TO REPORT IMMEDIATELY:   Following upper endoscopy (EGD)  Vomiting of blood or coffee ground material  New chest pain or pain under the shoulder blades  Painful or persistently difficult swallowing  New shortness of breath  Fever of 100F or higher  Black, tarry-looking stools  For urgent or emergent issues, a gastroenterologist can be reached at any hour by calling [phone]. Do not use MyChart messaging for urgent concerns.    DIET:  We do recommend a small meal at first, but then you may proceed to your regular diet.  Drink plenty of fluids but you should avoid alcoholic beverages for 24 hours.  ACTIVITY:  You should plan to take it  easy for the rest of today and you should NOT DRIVE or use heavy machinery until tomorrow (because of the sedation medicines used during the test).    FOLLOW UP: Our staff will call the number listed on your records 48-72 hours following your procedure to check on you and address any questions or concerns that you may have regarding the information given to you following your procedure. If we do not reach you, we will leave a message.  We will attempt to reach you two times.  During this call, we will ask if you have developed any symptoms of COVID 19. If you develop any symptoms (ie: fever, flu-like symptoms, shortness of breath, cough etc.) before then, please call [phone].  If you test positive for Covid 19 in the 2 weeks post procedure, please call and report this information to [place].    If any biopsies were taken you will be contacted by phone or by letter within the next 1-3 weeks.  Please call us at [phone] if you have not heard about the biopsies in 3 weeks.    SIGNATURES/CONFIDENTIALITY: You and/or your care partner have signed paperwork which will be entered into your electronic medical record.  These signatures attest to the fact that that the information above on your After Visit Summary has been reviewed and is understood.  Full responsibility of the confidentiality of this discharge information lies with you and/or your care-partner.

## 2020-09-21 ENCOUNTER — Telehealth: Payer: Self-pay | Admitting: *Deleted

## 2020-09-21 ENCOUNTER — Telehealth: Payer: Self-pay

## 2020-09-21 NOTE — Telephone Encounter (Signed)
Follow up call made, left message. 

## 2020-09-21 NOTE — Telephone Encounter (Signed)
  Follow up Call-  Call back number 09/17/2020  Post procedure Call Back phone  # [phone]  Permission to leave phone message Yes  Some recent data might be hidden     Patient questions:  Do you have a fever, pain , or abdominal swelling? No. Pain Score  0 *  Have you tolerated food without any problems? Yes.    Have you been able to return to your normal activities? Yes.    Do you have any questions about your discharge instructions: Diet   No. Medications  No. Follow up visit  No.  Do you have questions or concerns about your Care? No.  Actions: * If pain score is 4 or above: No action needed, pain <4.  1. Have you developed a fever since your procedure? no  2.   Have you had an respiratory symptoms (SOB or cough) since your procedure? no  3.   Have you tested positive for COVID 19 since your procedure no  4.   Have you had any family members/close contacts diagnosed with the COVID 19 since your procedure?  no   If yes to any of these questions please route to Joylene John, RN and Joella Prince, RN

## 2020-09-27 ENCOUNTER — Encounter: Payer: Self-pay | Admitting: Internal Medicine

## 2020-10-05 ENCOUNTER — Ambulatory Visit: Payer: Medicare Other | Admitting: Cardiovascular Disease

## 2020-10-11 ENCOUNTER — Ambulatory Visit (INDEPENDENT_AMBULATORY_CARE_PROVIDER_SITE_OTHER): Payer: Medicare Other

## 2020-10-11 VITALS — BP 107/77 | HR 81

## 2020-10-11 DIAGNOSIS — Z0000 Encounter for general adult medical examination without abnormal findings: Secondary | ICD-10-CM

## 2020-10-11 NOTE — Patient Instructions (Signed)
Glen [name] , Thank you for taking time to come for your Medicare Wellness Visit. I appreciate your ongoing commitment to your health goals. Please review the following plan we discussed and let me know if I can assist you in the future.   Screening recommendations/referrals: Colonoscopy: no longer required Recommended yearly ophthalmology/optometry visit for glaucoma screening and checkup Recommended yearly dental visit for hygiene and checkup  Vaccinations: Influenza vaccine: Done 06/21/20 Pneumococcal vaccine: Up to date Tdap vaccine: Due and discussed Covid-19: Completed 1/22 & 11/14/19 and 07/01/20  Advanced directives: Please bring a copy of your health care power of attorney and living will to the office at your convenience.  Conditions/risks identified: Get rid of the fluid on feet  Next appointment: Follow up in one year for your annual wellness visit.   Preventive Care 85 Years and Older, Male Preventive care refers to lifestyle choices and visits with your health care provider that can promote health and wellness. What does preventive care include?  A yearly physical exam. This is also called an annual well check.  Dental exams once or twice a year.  Routine eye exams. Ask your health care provider how often you should have your eyes checked.  Personal lifestyle choices, including:  Daily care of your teeth and gums.  Regular physical activity.  Eating a healthy diet.  Avoiding tobacco and drug use.  Limiting alcohol use.  Practicing safe sex.  Taking low doses of aspirin every day.  Taking vitamin and mineral supplements as recommended by your health care provider. What happens during an annual well check? The services and screenings done by your health care provider during your annual well check will depend on your age, overall health, lifestyle risk factors, and family history of disease. Counseling  Your health care provider may ask you questions about  your:  Alcohol use.  Tobacco use.  Drug use.  Emotional well-being.  Home and relationship well-being.  Sexual activity.  Eating habits.  History of falls.  Memory and ability to understand (cognition).  Work and work Statistician. Screening  You may have the following tests or measurements:  Height, weight, and BMI.  Blood pressure.  Lipid and cholesterol levels. These may be checked every 5 years, or more frequently if you are over 63 years old.  Skin check.  Lung cancer screening. You may have this screening every year starting at age 75 if you have a 30-pack-year history of smoking and currently smoke or have quit within the past 15 years.  Fecal occult blood test (FOBT) of the stool. You may have this test every year starting at age 28.  Flexible sigmoidoscopy or colonoscopy. You may have a sigmoidoscopy every 5 years or a colonoscopy every 10 years starting at age 47.  Prostate cancer screening. Recommendations will vary depending on your family history and other risks.  Hepatitis C blood test.  Hepatitis B blood test.  Sexually transmitted disease (STD) testing.  Diabetes screening. This is done by checking your blood sugar (glucose) after you have not eaten for a while (fasting). You may have this done every 1-3 years.  Abdominal aortic aneurysm (AAA) screening. You may need this if you are a current or former smoker.  Osteoporosis. You may be screened starting at age 25 if you are at high risk. Talk with your health care provider about your test results, treatment options, and if necessary, the need for more tests. Vaccines  Your health care provider may recommend certain vaccines, such as:  Influenza vaccine. This is recommended every year.  Tetanus, diphtheria, and acellular pertussis (Tdap, Td) vaccine. You may need a Td booster every 10 years.  Zoster vaccine. You may need this after age 24.  Pneumococcal 13-valent conjugate (PCV13) vaccine.  One dose is recommended after age 9.  Pneumococcal polysaccharide (PPSV23) vaccine. One dose is recommended after age 72. Talk to your health care provider about which screenings and vaccines you need and how often you need them. This information is not intended to replace advice given to you by your health care provider. Make sure you discuss any questions you have with your health care provider. Document Released: 10/15/2015 Document Revised: 06/07/2016 Document Reviewed: 07/20/2015 Elsevier Interactive Patient Education  2017 [employer] Prevention in the Home Falls can cause injuries. They can happen to people of all ages. There are many things you can do to make your home safe and to help prevent falls. What can I do on the outside of my home?  Regularly fix the edges of walkways and driveways and fix any cracks.  Remove anything that might make you trip as you walk through a door, such as a raised step or threshold.  Trim any bushes or trees on the path to your home.  Use bright outdoor lighting.  Clear any walking paths of anything that might make someone trip, such as rocks or tools.  Regularly check to see if handrails are loose or broken. Make sure that both sides of any steps have handrails.  Any raised decks and porches should have guardrails on the edges.  Have any leaves, snow, or ice cleared regularly.  Use sand or salt on walking paths during winter.  Clean up any spills in your garage right away. This includes oil or grease spills. What can I do in the bathroom?  Use night lights.  Install grab bars by the toilet and in the tub and shower. Do not use towel bars as grab bars.  Use non-skid mats or decals in the tub or shower.  If you need to sit down in the shower, use a plastic, non-slip stool.  Keep the floor dry. Clean up any water that spills on the floor as soon as it happens.  Remove soap buildup in the tub or shower regularly.  Attach bath  mats securely with double-sided non-slip rug tape.  Do not have throw rugs and other things on the floor that can make you trip. What can I do in the bedroom?  Use night lights.  Make sure that you have a light by your bed that is easy to reach.  Do not use any sheets or blankets that are too big for your bed. They should not hang down onto the floor.  Have a firm chair that has side arms. You can use this for support while you get dressed.  Do not have throw rugs and other things on the floor that can make you trip. What can I do in the kitchen?  Clean up any spills right away.  Avoid walking on wet floors.  Keep items that you use a lot in easy-to-reach places.  If you need to reach something above you, use a strong step stool that has a grab bar.  Keep electrical cords out of the way.  Do not use floor polish or wax that makes floors slippery. If you must use wax, use non-skid floor wax.  Do not have throw rugs and other things on the floor that  can make you trip. What can I do with my stairs?  Do not leave any items on the stairs.  Make sure that there are handrails on both sides of the stairs and use them. Fix handrails that are broken or loose. Make sure that handrails are as long as the stairways.  Check any carpeting to make sure that it is firmly attached to the stairs. Fix any carpet that is loose or worn.  Avoid having throw rugs at the top or bottom of the stairs. If you do have throw rugs, attach them to the floor with carpet tape.  Make sure that you have a light switch at the top of the stairs and the bottom of the stairs. If you do not have them, ask someone to add them for you. What else can I do to help prevent falls?  Wear shoes that:  Do not have high heels.  Have rubber bottoms.  Are comfortable and fit you well.  Are closed at the toe. Do not wear sandals.  If you use a stepladder:  Make sure that it is fully opened. Do not climb a closed  stepladder.  Make sure that both sides of the stepladder are locked into place.  Ask someone to hold it for you, if possible.  Clearly mark and make sure that you can see:  Any grab bars or handrails.  First and last steps.  Where the edge of each step is.  Use tools that help you move around (mobility aids) if they are needed. These include:  Canes.  Walkers.  Scooters.  Crutches.  Turn on the lights when you go into a dark area. Replace any light bulbs as soon as they burn out.  Set up your furniture so you have a clear path. Avoid moving your furniture around.  If any of your floors are uneven, fix them.  If there are any pets around you, be aware of where they are.  Review your medicines with your doctor. Some medicines can make you feel dizzy. This can increase your chance of falling. Ask your doctor what other things that you can do to help prevent falls. This information is not intended to replace advice given to you by your health care provider. Make sure you discuss any questions you have with your health care provider. Document Released: 07/15/2009 Document Revised: 02/24/2016 Document Reviewed: 10/23/2014 Elsevier Interactive Patient Education  2017 [employer].

## 2020-10-11 NOTE — Progress Notes (Signed)
Virtual Visit via Telephone Note  I connected with  Glen [name] on 10/11/20 at  3:15 PM EST by telephone and verified that I am speaking with the correct person using two identifiers.  Medicare Annual Wellness visit completed telephonically due to Covid-19 pandemic.   Persons participating in this call: This Health Coach and this patient.   Location: Patient: Home Provider: Office   I discussed the limitations, risks, security and privacy concerns of performing an evaluation and management service by telephone and the availability of in person appointments. The patient expressed understanding and agreed to proceed.  Unable to perform video visit due to video visit attempted and failed and/or patient does not have video capability.   Some vital signs may be absent or patient reported.   Willette Brace, LPN    Subjective:   Glen [name] is a 87 y.o. male who presents for Medicare Annual/Subsequent preventive examination.  Review of Systems     Cardiac Risk Factors include: advanced age (>82men, >16 women);dyslipidemia;hypertension;male gender     Objective:    Today's Vitals   10/11/20 1531  BP: 107/77  Pulse: 81   There is no height or weight on file to calculate BMI.  Advanced Directives 10/11/2020 09/17/2020 08/09/2020 08/09/2020 07/14/2020 07/14/2020 07/14/2020  Does Patient Have a Medical Advance Directive? Yes Yes Yes No Yes - Yes  Type of Paramedic of [place];Living will [place];Living will Living will;Healthcare Power of [place] - -  Does patient want to make changes to medical advance directive? - No - Patient declined No - Patient declined - No - Patient declined - -  Copy of [place] in Chart? No - copy requested - No - copy requested - No - copy requested - -  Would patient like information on creating a medical advance directive? - - No - Patient declined No  - Patient declined No - Patient declined No - Patient declined -  Pre-existing out of facility DNR order (yellow form or pink MOST form) - - - - - - -    Current Medications (verified) Outpatient Encounter Medications as of 10/11/2020  Medication Sig  . metoprolol tartrate (LOPRESSOR) 100 MG tablet Take 1 tablet (100 mg total) by mouth 2 (two) times daily.  . polyethylene glycol powder (MIRALAX) 17 GM/SCOOP powder Take 17 g by mouth 2 (two) times daily as needed for moderate constipation.   Facility-Administered Encounter Medications as of 10/11/2020  Medication  . 0.9 %  sodium chloride infusion    Allergies (verified) Protonix [pantoprazole], Codeine, and Ferrous sulfate   History: Past Medical History:  Diagnosis Date  . Atrial fibrillation ([place])   . Diverticulitis   . Esophageal dysmotility   . Family history of diabetes insipidus   . FHx: colon cancer   . H. pylori infection   . History of colonic polyps   . Hyperlipidemia   . Hypertension   . IDA (iron deficiency anemia)   . Neoplasm of prostate    special screening malignant   . Obesity   . Palpitation   . Peptic ulcer disease    Past Surgical History:  Procedure Laterality Date  . 2-D Echocardiogram  November 2009, 2011  . Anal Fistula Repair    . BIOPSY  07/14/2020   Procedure: BIOPSY;  Surgeon: [name], MD;  Location: [facility] ENDOSCOPY;  Service: Gastroenterology;;  . [place]   status  post balloon valvuloplasty at [facility]  . CATARACT EXTRACTION  2004  . COLONOSCOPY  December 2005  . ESOPHAGOGASTRODUODENOSCOPY  07/14/2020  . ESOPHAGOGASTRODUODENOSCOPY (EGD) WITH PROPOFOL N/A 07/14/2020   Procedure: ESOPHAGOGASTRODUODENOSCOPY (EGD) WITH PROPOFOL;  Surgeon: [name], MD;  Location: [facility];  Service: Gastroenterology;  Laterality: N/A;  . ETT  1991   Negative  . HEMOSTASIS CLIP PLACEMENT  07/14/2020   HEMOSTASIS CLIP PLACEMENT  . HEMOSTASIS CLIP PLACEMENT  07/14/2020   Procedure:  HEMOSTASIS CLIP PLACEMENT;  Surgeon: [name], MD;  Location: [facility] ENDOSCOPY;  Service: Gastroenterology;;  . KNEE ARTHROSCOPY     Right  . TONSILLECTOMY     Family History  Problem Relation Age of Onset  . Heart attack Father   . Colon cancer Other   . Diabetes Other   . Prostate cancer Brother   . Heart disease Other   . Coronary artery disease Other        Two siblings, status post stenting to siblings with heart disease. One. Status post pacemaker insertion.  . Stroke Brother    Social History   Socioeconomic History  . Marital status: Married    Spouse name: Not on file  . Number of children: Not on file  . Years of education: Not on file  . Highest education level: Not on file  Occupational History  . Occupation: Retired    Fish farm manager: RETIRED  Tobacco Use  . Smoking status: Never Smoker  . Smokeless tobacco: Never Used  Vaping Use  . Vaping Use: Never used  Substance and Sexual Activity  . Alcohol use: No  . Drug use: Never  . Sexual activity: Not on file  Other Topics Concern  . Not on file  Social History Narrative   Regular exercise: Yes: [employer] 4 times/week   Social Determinants of Health   Financial Resource Strain: Low Risk   . Difficulty of Paying Living Expenses: Not hard at all  Food Insecurity: No Food Insecurity  . Worried About Charity fundraiser in the Last Year: Never true  . Ran Out of Food in the Last Year: Never true  Transportation Needs: No Transportation Needs  . Lack of Transportation (Medical): No  . Lack of Transportation (Non-Medical): No  Physical Activity: Insufficiently Active  . Days of Exercise per Week: 5 days  . Minutes of Exercise per Session: 10 min  Stress: No Stress Concern Present  . Feeling of Stress : Not at all  Social Connections: Moderately Isolated  . Frequency of Communication with Friends and Family: Three times a week  . Frequency of Social Gatherings with Friends and Family: Never  . Attends Religious  Services: Never  . Active Member of Clubs or Organizations: No  . Attends Archivist Meetings: Never  . Marital Status: Married    Tobacco Counseling Counseling given: Not Answered   Clinical Intake:  Pre-visit preparation completed: Yes  Pain : No/denies pain     BMI - recorded: 28.5 Nutritional Status: BMI 25 -29 Overweight Nutritional Risks: None Diabetes: No  How often do you need to have someone help you when you read instructions, pamphlets, or other written materials from your doctor or pharmacy?: 1 - Never  Diabetic?No  Interpreter Needed?: No  Information entered by :: Charlott Rakes, LPN   Activities of Daily Living In your present state of health, do you have any difficulty performing the following activities: 10/11/2020 08/09/2020  Hearing? N Y  Vision? N N  Difficulty  concentrating or making decisions? N N  Walking or climbing stairs? Y Y  Comment one step at a time -  Dressing or bathing? N Y  Doing errands, shopping? N Y  Conservation officer, nature and eating ? N -  Using the Toilet? N -  In the past six months, have you accidently leaked urine? N -  Do you have problems with loss of bowel control? N -  Managing your Medications? N -  Managing your Finances? N -  Housekeeping or managing your Housekeeping? N -  Some recent data might be hidden    Patient Care Team: [name], MD as PCP - General (Family Medicine) Troy Sine, MD as PCP - Cardiology (Cardiology) Sharyon Medicus, MD as Rounding Team (Internal Medicine) Deloria Lair, NP as [place] any recent [place] you may have received from other than [facility] providers in the past year (date may be approximate).     Assessment:   This is a routine wellness examination for Glen [name].  Hearing/Vision screen  Hearing Screening   125Hz  250Hz  500Hz  1000Hz  2000Hz  3000Hz  4000Hz  6000Hz  8000Hz   Right ear:           Left ear:            Comments: Denies hearing issues at this time  Vision Screening Comments: Pt follows up with [facility] in [place] for annual exams   Dietary issues and exercise activities discussed: Current Exercise Habits: Home exercise routine, Type of exercise: walking, Time (Minutes): 10, Frequency (Times/Week): 5, Weekly Exercise (Minutes/Week): 50  Goals    . LIFESTYLE - DECREASE FALLS RISK    . Patient Stated     Get rid of fluid on feet       Depression Screen PHQ 2/9 Scores 10/11/2020 07/23/2020 07/23/2020 06/25/2019 07/29/2018 04/24/2016 04/19/2015  PHQ - 2 Score 0 1 1 0 0 0 0  PHQ- 9 Score - 7 - - - - -    Fall Risk Fall Risk  10/11/2020 06/25/2019 06/04/2019 07/29/2018 04/24/2016  Falls in the past year? 0 1 1 No Yes  Number falls in past yr: 0 1 - - 2 or more  Injury with Fall? 0 1 1 - No  Risk for fall due to : Impaired vision;Impaired mobility;Impaired balance/gait History of fall(s);Impaired balance/gait;Impaired mobility - - -  Follow up Falls prevention discussed Education provided;Falls prevention discussed;Falls evaluation completed - - -    FALL RISK PREVENTION PERTAINING TO THE HOME:  Any stairs in or around the home? Yes  If so, are there any without handrails? No  Home free of loose throw rugs in walkways, pet beds, electrical cords, etc? Yes  Adequate lighting in your home to reduce risk of falls? Yes   ASSISTIVE DEVICES UTILIZED TO PREVENT FALLS:  Life alert? No  Use of a cane, walker or w/c? Yes  Grab bars in the bathroom? Yes  Shower chair or bench in shower? Yes  Elevated toilet seat or a handicapped toilet? Yes   TIMED UP AND GO:  Was the test performed? No .     Cognitive Function:     6CIT Screen 10/11/2020 06/25/2019  What Year? 0 points 0 points  What month? 0 points 0 points  What time? - 0 points  Count back from 20 2 points 0 points  Months in reverse 2 points 0 points  Repeat phrase 4 points 2 points  Total Score - 2  Immunizations Immunization History  Administered Date(s) Administered  . Fluad Quad(high Dose 65+) 06/25/2019, 06/21/2020  . Influenza Split 08/23/2011, 06/25/2012  . Influenza Whole 10/02/2005, 08/11/2009, 08/04/2010  . Influenza, High Dose Seasonal PF 07/15/2015, 06/26/2016, 07/23/2017, 07/03/2018  . Influenza,inj,Quad PF,6+ Mos 07/07/2013, 06/29/2014  . PFIZER SARS-COV-2 Vaccination 10/24/2019, 11/14/2019, 07/01/2020  . Pneumococcal Conjugate-13 10/17/2013  . Pneumococcal Polysaccharide-23 10/02/2005  . Td 03/17/2009    TDAP status: Due, Education has been provided regarding the importance of this vaccine. Advised may receive this vaccine at local pharmacy or Health Dept. Aware to provide a copy of the vaccination record if obtained from local pharmacy or Health Dept. Verbalized acceptance and understanding.  Flu Vaccine status: Up to date  Done 06/21/20 Pneumococcal vaccine status: Up to date  Covid-19 vaccine status: Completed vaccines   Screening Tests Health Maintenance  Topic Date Due  . TETANUS/TDAP  03/18/2019  . INFLUENZA VACCINE  Completed  . COVID-19 Vaccine  Completed  . PNA vac Low Risk Adult  Completed    Health Maintenance  Health Maintenance Due  Topic Date Due  . TETANUS/TDAP  03/18/2019    Colorectal cancer screening: No longer required.    Additional Screening:   Vision Screening: Recommended annual ophthalmology exams for early detection of glaucoma and other disorders of the eye. Is the patient up to date with their annual eye exam?  Yes  Who is the provider or what is the name of the office in which the patient attends annual eye exams? [facility] in [place]   Dental Screening: Recommended annual dental exams for proper oral hygiene  Community Resource Referral / Chronic Care Management: CRR required this visit?  No   CCM required this visit?  No      Plan:     I have personally reviewed and noted the following in the  patient's chart:   . Medical and social history . Use of alcohol, tobacco or illicit drugs  . Current medications and supplements . Functional ability and status . Nutritional status . Physical activity . Advanced directives . List of other physicians . Hospitalizations, surgeries, and ER visits in previous 12 months . Vitals . Screenings to include cognitive, depression, and falls . Referrals and appointments  In addition, I have reviewed and discussed with patient certain preventive protocols, quality metrics, and best practice recommendations. A written personalized care plan for preventive services as well as general preventive health recommendations were provided to patient.     Willette Brace, LPN   [id number]   Nurse Notes: None

## 2020-12-03 ENCOUNTER — Ambulatory Visit: Payer: Medicare Other | Admitting: Cardiovascular Disease

## 2020-12-20 ENCOUNTER — Other Ambulatory Visit: Payer: Self-pay

## 2020-12-20 ENCOUNTER — Ambulatory Visit (INDEPENDENT_AMBULATORY_CARE_PROVIDER_SITE_OTHER): Payer: Medicare Other | Admitting: Family Medicine

## 2020-12-20 ENCOUNTER — Encounter: Payer: Self-pay | Admitting: Family Medicine

## 2020-12-20 VITALS — BP 118/77 | HR 76 | Temp 98.1°F | Ht 72.0 in | Wt 217.0 lb

## 2020-12-20 DIAGNOSIS — W19XXXD Unspecified fall, subsequent encounter: Secondary | ICD-10-CM

## 2020-12-20 DIAGNOSIS — M25561 Pain in right knee: Secondary | ICD-10-CM | POA: Diagnosis not present

## 2020-12-20 DIAGNOSIS — R609 Edema, unspecified: Secondary | ICD-10-CM

## 2020-12-20 DIAGNOSIS — I10 Essential (primary) hypertension: Secondary | ICD-10-CM

## 2020-12-20 DIAGNOSIS — M1990 Unspecified osteoarthritis, unspecified site: Secondary | ICD-10-CM

## 2020-12-20 DIAGNOSIS — I482 Chronic atrial fibrillation, unspecified: Secondary | ICD-10-CM | POA: Diagnosis not present

## 2020-12-20 DIAGNOSIS — R600 Localized edema: Secondary | ICD-10-CM

## 2020-12-20 MED ORDER — METHYLPREDNISOLONE ACETATE 80 MG/ML IJ SUSP
80.0000 mg | Freq: Once | INTRAMUSCULAR | Status: AC
  Administered 2020-12-20: 80 mg via INTRA_ARTICULAR

## 2020-12-20 NOTE — Assessment & Plan Note (Signed)
Injection performed today. See below procedure note. He tolerated well.

## 2020-12-20 NOTE — Progress Notes (Signed)
   Glen [name] is a 87 y.o. male who presents today for an office visit.  Assessment/Plan:  Chronic Problems Addressed Today: Peripheral edema Stable with conservative management. Discussed importance of leg elevation and avoidance of salt.  Atrial fibrillation, chronic (HCC) No longer on anticoagulation due to GI bleed. On lopressor 100mg  twice daily.   Essential hypertension At goal on metoprolol succinate 100mg  twice daily.   Fall Has worked with PT in the past. No recent falls. Uses a walker.   Osteoarthritis Injection performed today. See below procedure note. He tolerated well.     Subjective:  HPI:  See A/p for status of chronic conditions.         Objective:  Physical Exam: BP 118/77   Pulse 76   Temp 98.1 F (36.7 C) (Temporal)   Ht 6' (1.829 m)   Wt 217 lb (98.4 kg)   SpO2 97%   BMI 29.43 kg/m   Gen: No acute distress, resting comfortably CV: Regular rate and rhythm with no murmurs appreciated Pulm: Normal work of breathing, clear to auscultation bilaterally with no crackles, wheezes, or rhonchi Neuro: Grossly normal, moves all extremities Psych: Normal affect and thought content  Knee Injection Procedure Note  Indication: Symptom relief of Right Knee Pain.  Procedure Details  Verbal consent was obtained for the procedure. The joint was prepped with Betadine. Topical ethyl chloride was applied for anesthesia. A 22 gauge needle was inserted into the medial aspect of the joint.  3 ml 1% lidocaine and 1 ml of 80mg /cc Depo-Medrol was then injected into the joint. The needle was removed and the area cleansed and dressed.  Complications:  None; patient tolerated the procedure well.        [name]. [name], MD 12/20/2020 9:42 AM

## 2020-12-20 NOTE — Assessment & Plan Note (Signed)
No longer on anticoagulation due to GI bleed. On lopressor 100mg  twice daily.

## 2020-12-20 NOTE — Assessment & Plan Note (Signed)
Stable with conservative management. Discussed importance of leg elevation and avoidance of salt.

## 2020-12-20 NOTE — Assessment & Plan Note (Signed)
Has worked with PT in the past. No recent falls. Uses a walker.

## 2020-12-20 NOTE — Patient Instructions (Signed)
It was very nice to see you today!  We injected your knee today.  No other changes today.  I will see you back in 6 months. Come back to see me sooner if needed.   Take care, Dr [name]  PLEASE NOTE:  If you had any lab tests please let us know if you have not heard back within a few days. You may see your results on mychart before we have a chance to review them but we will give you a call once they are reviewed by [place]. If we ordered any referrals today, please let us know if you have not heard from their office within the next week.   Please try these tips to maintain a healthy lifestyle:   Eat at least 3 REAL meals and 1-2 snacks per day.  Aim for no more than 5 hours between eating.  If you eat breakfast, please do so within one hour of getting up.    Each meal should contain half fruits/vegetables, one quarter protein, and one quarter carbs (no bigger than a computer mouse)   Cut down on sweet beverages. This includes juice, soda, and sweet tea.     Drink at least 1 glass of water with each meal and aim for at least 8 glasses per day   Exercise at least 150 minutes every week.

## 2020-12-20 NOTE — Assessment & Plan Note (Signed)
At goal on metoprolol succinate 100mg  twice daily.

## 2021-01-17 ENCOUNTER — Ambulatory Visit: Payer: Medicare Other | Admitting: Cardiovascular Disease

## 2021-02-01 DIAGNOSIS — Z23 Encounter for immunization: Secondary | ICD-10-CM | POA: Diagnosis not present

## 2021-03-12 IMAGING — CT CT HEAD W/O CM
3 series · 15 of 47 positions shown, 18 images · non-contrast
Comparison: Head CT 11/08/2018.

CLINICAL DATA: Head trauma, minor. Additional history provided:
Emesis/fall.

EXAM:
CT HEAD WITHOUT CONTRAST
TECHNIQUE: Contiguous axial images were obtained from the base of the skull
through the vertex without intravenous contrast.

[Series 3: head 5.0 h30s · axial · 0.44mm/px · z∈[-146,-6]mm · 9 of 34 slices shown, 12 images]
[im 3/34  brain]
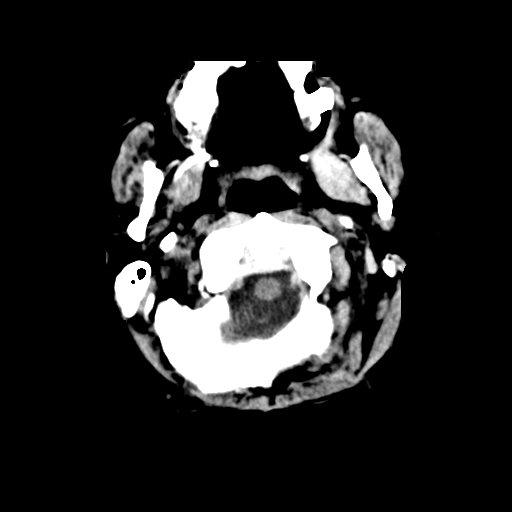
[im 3/34  bone]
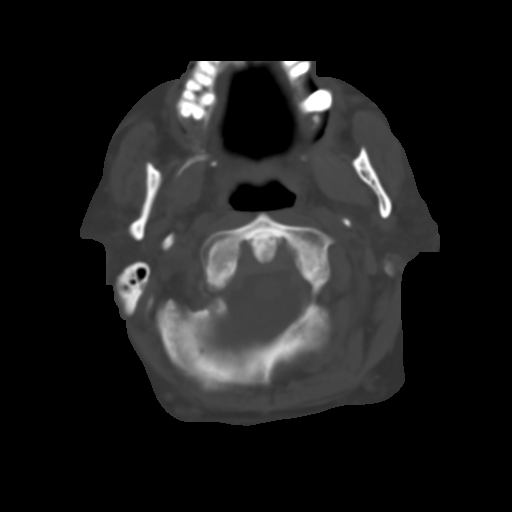
[im 6/34  brain]
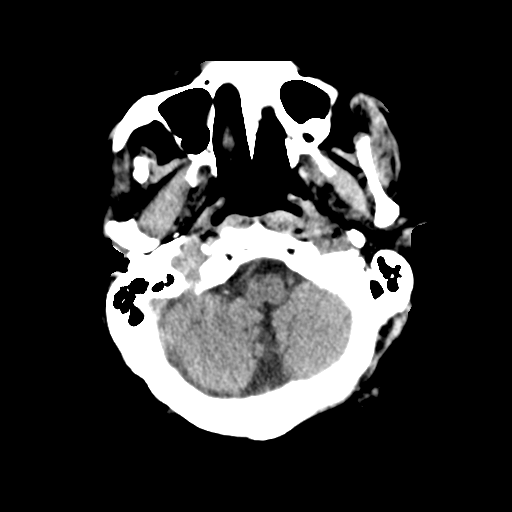
[im 10/34  brain]
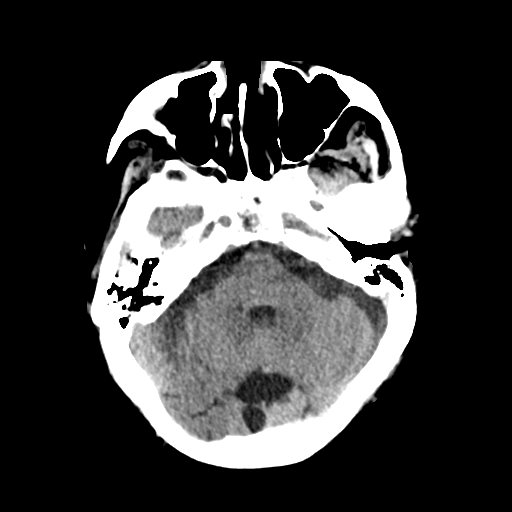
[im 13/34  brain]
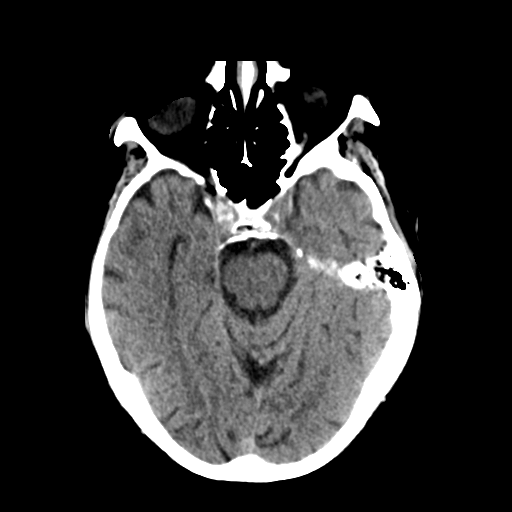
[im 18/34  brain]
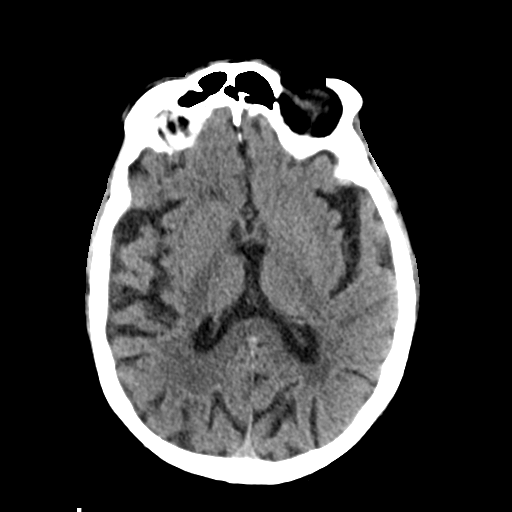
[im 18/34  bone]
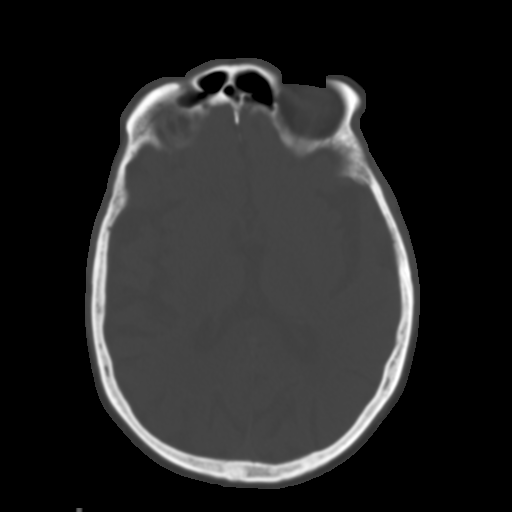
[im 21/34  brain]
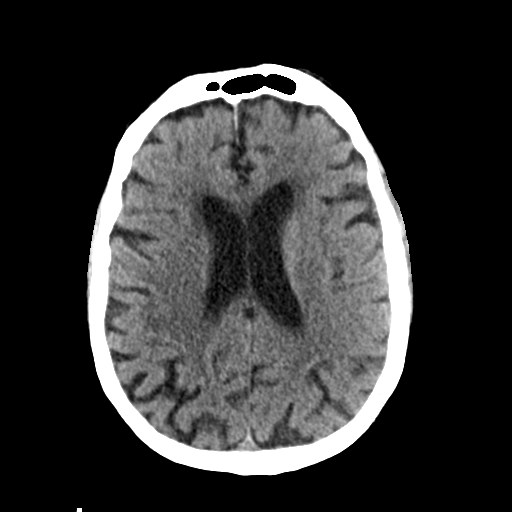
[im 24/34  brain]
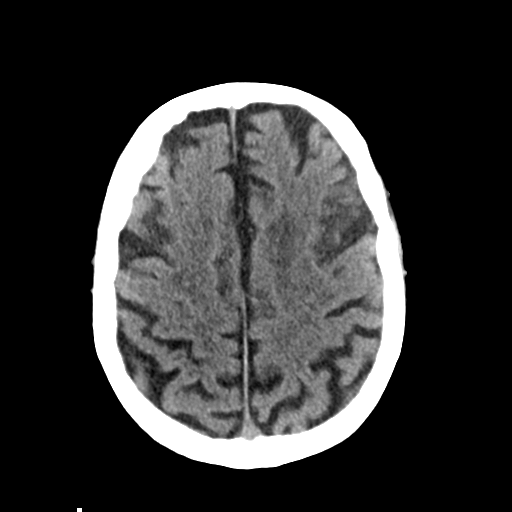
[im 28/34  brain]
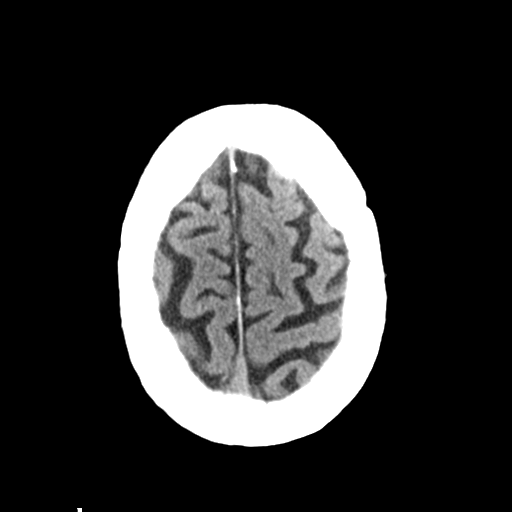
[im 31/34  brain]
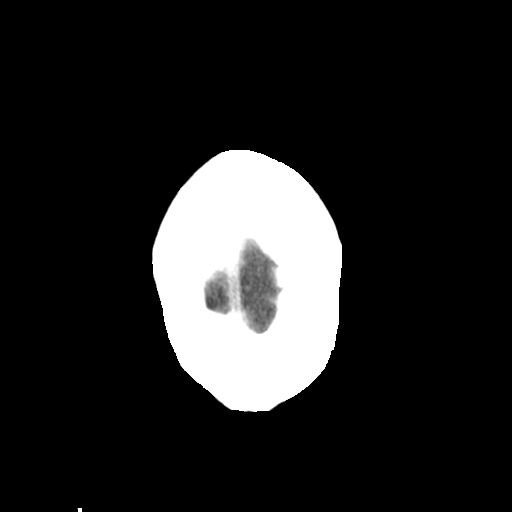
[im 31/34  bone]
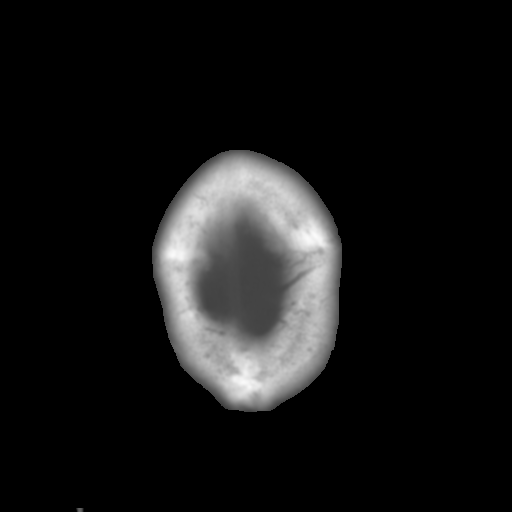

[Series 5: head 3.0 mpr cor · coronal · 0.34mm/px · 3 of 70 slices shown]
[im 24/70  brain]
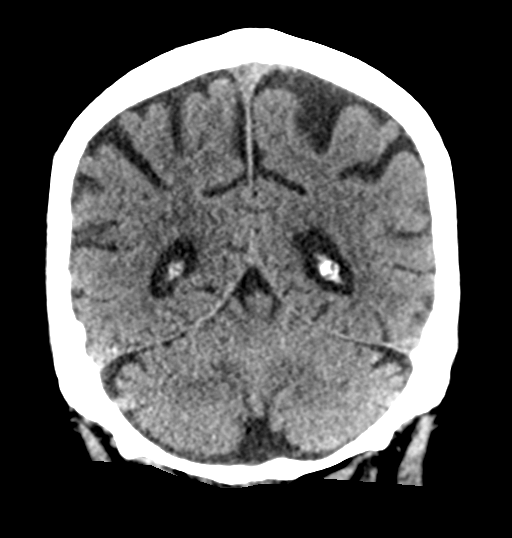
[im 31/70  brain]
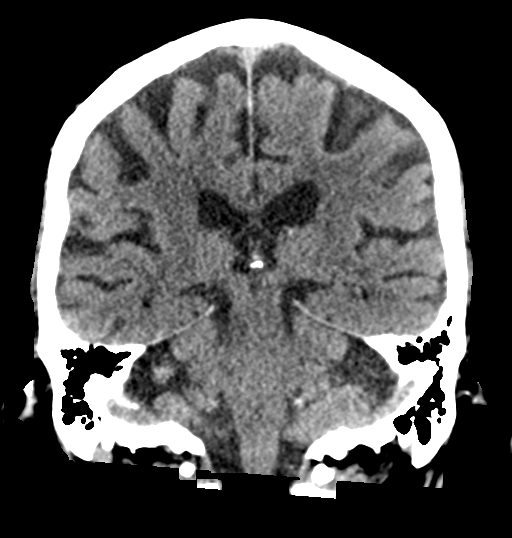
[im 39/70  brain]
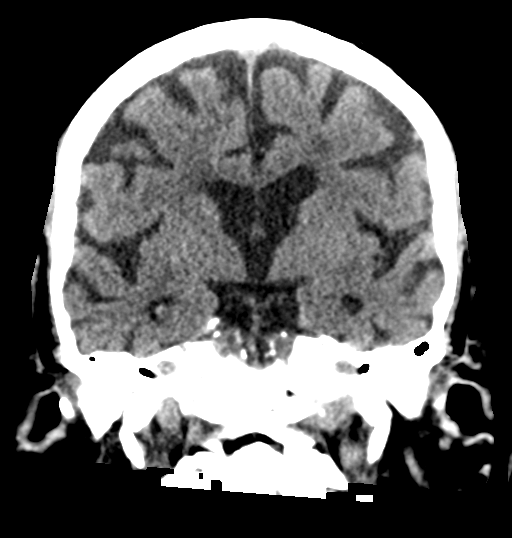

[Series 6: head 3.0 mpr sag · sagittal · 0.33mm/px · 3 of 67 slices shown]
[im 23/67  brain]
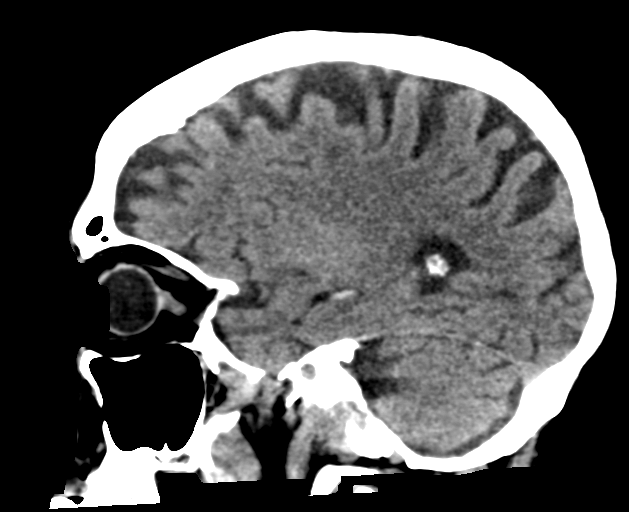
[im 34/67  brain]
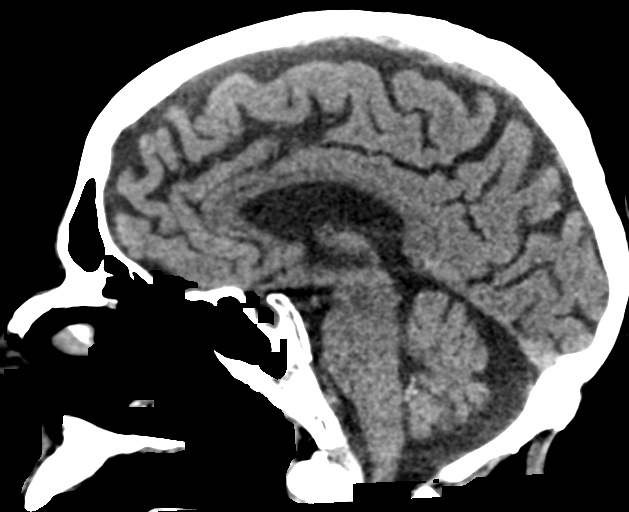
[im 45/67  brain]
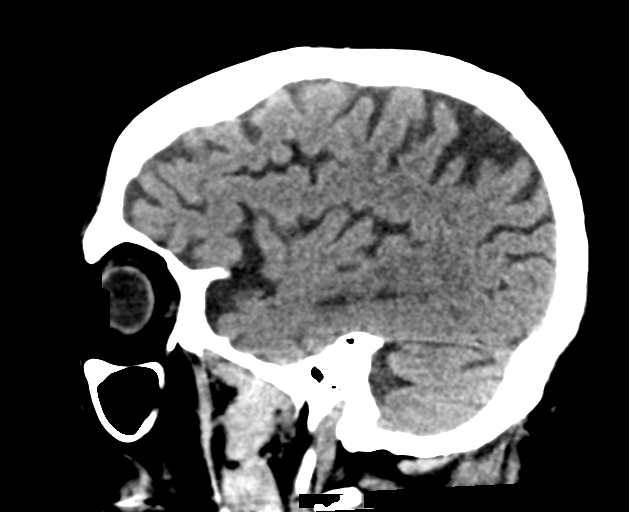

[15 of 47 positions shown; findings below may reference images not displayed]

FINDINGS: Brain:

Mild generalized cerebral atrophy.

Mild ill-defined hypoattenuation within the cerebral white matter is
nonspecific, but consistent with chronic small vessel ischemic
disease.

There is no acute intracranial hemorrhage.

No demarcated cortical infarct.

No extra-axial fluid collection.

No evidence of intracranial mass.

No midline shift.

Vascular: No hyperdense vessel.  Atherosclerotic calcifications.

Skull: Normal. Negative for fracture or focal lesion.

Sinuses/Orbits: Visualized orbits show no acute finding. Mild
ethmoid and maxillary sinus mucosal thickening. No significant
mastoid effusion.
IMPRESSION: No evidence of acute intracranial abnormality.

Mild generalized cerebral atrophy and chronic small vessel ischemic
disease, stable as compared to the head CT of 11/08/2018.

Mild ethmoid and maxillary sinus mucosal thickening.

## 2021-05-06 ENCOUNTER — Other Ambulatory Visit: Payer: Self-pay

## 2021-05-06 ENCOUNTER — Encounter: Payer: Self-pay | Admitting: Cardiovascular Disease

## 2021-05-06 ENCOUNTER — Telehealth: Payer: Self-pay | Admitting: Cardiovascular Disease

## 2021-05-06 ENCOUNTER — Ambulatory Visit (INDEPENDENT_AMBULATORY_CARE_PROVIDER_SITE_OTHER): Payer: Medicare Other | Admitting: Cardiovascular Disease

## 2021-05-06 VITALS — BP 110/80 | HR 92 | Ht 72.0 in | Wt 228.4 lb

## 2021-05-06 DIAGNOSIS — R609 Edema, unspecified: Secondary | ICD-10-CM

## 2021-05-06 DIAGNOSIS — I10 Essential (primary) hypertension: Secondary | ICD-10-CM | POA: Diagnosis not present

## 2021-05-06 DIAGNOSIS — E669 Obesity, unspecified: Secondary | ICD-10-CM

## 2021-05-06 DIAGNOSIS — Z79899 Other long term (current) drug therapy: Secondary | ICD-10-CM

## 2021-05-06 DIAGNOSIS — I4821 Permanent atrial fibrillation: Secondary | ICD-10-CM

## 2021-05-06 DIAGNOSIS — R600 Localized edema: Secondary | ICD-10-CM

## 2021-05-06 LAB — COMPREHENSIVE METABOLIC PANEL
ALT: 10 IU/L (ref 0–44)
AST: 17 IU/L (ref 0–40)
Albumin/Globulin Ratio: 1.8 (ref 1.2–2.2)
Albumin: 4.2 g/dL (ref 3.5–4.6)
Alkaline Phosphatase: 97 IU/L (ref 44–121)
BUN/Creatinine Ratio: 15 (ref 10–24)
BUN: 15 mg/dL (ref 10–36)
Bilirubin Total: 0.6 mg/dL (ref 0.0–1.2)
CO2: 20 mmol/L (ref 20–29)
Calcium: 9 mg/dL (ref 8.6–10.2)
Chloride: 102 mmol/L (ref 96–106)
Creatinine, Ser: 0.98 mg/dL (ref 0.76–1.27)
Globulin, Total: 2.4 g/dL (ref 1.5–4.5)
Glucose: 119 mg/dL — ABNORMAL HIGH (ref 65–99)
Potassium: 4.5 mmol/L (ref 3.5–5.2)
Sodium: 138 mmol/L (ref 134–144)
Total Protein: 6.6 g/dL (ref 6.0–8.5)
eGFR: 73 mL/min/{1.73_m2} (ref >59)

## 2021-05-06 LAB — TSH: TSH: 3.65 u[IU]/mL (ref 0.450–4.500)

## 2021-05-06 LAB — CBC
Hematocrit: 49.4 % (ref 37.5–51.0)
Hemoglobin: 16.9 g/dL (ref 13.0–17.7)
MCH: 32.3 pg (ref 26.6–33.0)
MCHC: 34.2 g/dL (ref 31.5–35.7)
MCV: 94 fL (ref 79–97)
Platelets: 204 10*3/uL (ref 150–450)
RBC: 5.24 x10E6/uL (ref 4.14–5.80)
RDW: 12.2 % (ref 11.6–15.4)
WBC: 8.1 10*3/uL (ref 3.4–10.8)

## 2021-05-06 MED ORDER — FUROSEMIDE 20 MG PO TABS: 20.0000 mg | ORAL_TABLET | Freq: Every day | ORAL | 3 refills | Status: DC

## 2021-05-06 NOTE — Telephone Encounter (Signed)
Patient is calling stating his AVS from today states he takes Tylenol PM. Pt reports he only takes Tylenol and it is not the PM version. Would like the nurse to call him in regards to this. Please advise.

## 2021-05-06 NOTE — Telephone Encounter (Signed)
Spoke to patient he stated he saw Dr.Kelly this morning.AVS says he is taking Tylenol PM.Stated he only takes plain Tylenol.Advised I will change in chart.

## 2021-05-06 NOTE — Patient Instructions (Signed)
Medication Instructions:  BEGIN furosemide (Lasix) '20mg'$  (1 tablet) daily. Per Dr. Claiborne Billings if you continue to have swelling notify the office, we may adjust your medication.  *If you need a refill on your cardiac medications before your next appointment, please call your pharmacy*   Lab Work: Today - CBC, CMET, TSH If you have labs (blood work) drawn today and your tests are completely normal, you will receive your results only by: [place] (if you have MyChart) OR A paper copy in the mail If you have any lab test that is abnormal or we need to change your treatment, we will call you to review the results.   Testing/Procedures:  To be scheduled at [facility]. Your physician has requested that you have an echocardiogram. Echocardiography is a painless test that uses sound waves to create images of your heart. It provides your doctor with information about the size and shape of your heart and how well your heart's chambers and valves are working. This procedure takes approximately one hour. There are no restrictions for this procedure.    Follow-Up: At [facility], you and your health needs are our priority.  As part of our continuing mission to provide you with exceptional heart care, we have created designated Provider Care Teams.  These Care Teams include your primary Cardiologist (physician) and Advanced Practice Providers (APPs -  Physician Assistants and Nurse Practitioners) who all work together to provide you with the care you need, when you need it.  We recommend signing up for the patient portal called "MyChart".  Sign up information is provided on this After Visit Summary.  MyChart is used to connect with patients for Virtual Visits (Telemedicine).  Patients are able to view lab/test results, encounter notes, upcoming appointments, etc.  Non-urgent messages can be sent to your provider as well.   To learn more about what you can do with MyChart, go to  [email or web address].    Your next appointment:   3 month(s)  The format for your next appointment:   In Person  Provider:   Shelva Majestic, MD

## 2021-05-06 NOTE — Progress Notes (Signed)
Primary MD: Andree Coss  PATIENT PROFILE: Glen [name] is a 87 y.o. male who is a former patient of Dr. Mare Ferrari who established care with me in September 2017.  He presents for 15 month follow-up cardiology evaluation.  HPI:  Glen [name] has a history of permanent AF, hypertension and hyperlipidemia. His AF has always been treated with rate control and he neveer had an attempt of cardioversion. He is on coumadin anticoagulation with a cha2ds2vasc score of 3. In the past he was treated with diltiazem and atenolol. He had gingival disease and was taken off diltiazem. Most recently he has been on metoprolol 50 mg bid for rate control but had reduced this to 25 mg bid since he felt that it had contributed to edema  He denies any awareness of sleep apnea. He does not exercise due to knee issues and gets injections into his kness intermittently. He last saw Dr. Mare Ferrari in 11/2015 and complained that his feet :were puffy on the bottom."   When I saw him, I did not feel that the metoprolol was contributing to his feet discomfort.  He had been switched to act to atenolol and most recently is now on metoprolol 50 mg in the morning and 25 mg, which she has been taking the early afternoon per Dr. Burnice Logan.  He continues to be on warfarin anticoagulation.  He continues to experience "puffiness "of his ankles.  At times he admits to eating food with increase sodium.  He denied chest pain, PND, orthopnea.  He denied bleeding.   He was having periods of diarrhea and constipation.  In the past, he was taken off Cardizem due to gingival hypertrophy by his dentist. He wanted to retry Cardizem for improved rate control along with metoprolol but ultimately stopped taking this.  Since I saw him he has been seen on 2 occasions by Dr. Harrell Gave with increased heart rate.  His beta-blocker regimen has been titrated to 50 mg twice a day and recently to 75 mg twice a day.  He has continued to be on warfarin  for anticoagulation.  He is unaware if his heart rhythm is fast. TSH was 4.8.    When I saw him in September 2019 his ventricular rate was increased and in the 120s.  At that time I further  titrated metoprolol to 100 mg twice a day.  I saw him in follow-up on September 03, 2018.  On his increased regimen he felt his heart rate had improved.  He denied any recent episodes of chest pain or shortness of breath.  At times there is some mild leg swelling.  He continued to be on warfarin for anticoagulation.  I last saw him on December 04, 2019 and had not seen him since his prior evaluation on September 03, 2018.  Apparently recently he had undergone a warfarin check and INR was 4.2. at his primary MDs office The patient insists today that he was told his atrial fibrillation has resolved.  He emphatically stated he did not want to be on warfarin tomorrow.  He stated that he has not had a heart attack and has not had a stroke and he did not need warfarin.  During that evaluation he was given a prescription for HCTZ 12.5 mg due to peripheral edema to take as necessary.  He is followed by Dr. Dimas Chyle for primary care.  Presently he admits to leg swelling right greater than left.  He denies any chest pain  or palpitations.  He continues to be on metoprolol tartrate 100 mg twice a day and takes Pepcid for GERD and MiraLAX.  He presents for yearly evaluation.  Past Medical History:  Diagnosis Date   Atrial fibrillation ([place])    Diverticulitis    Esophageal dysmotility    Family history of diabetes insipidus    FHx: colon cancer    H. pylori infection    History of colonic polyps    Hyperlipidemia    Hypertension    IDA (iron deficiency anemia)    Neoplasm of prostate    special screening malignant    Obesity    Palpitation    Peptic ulcer disease     Past Surgical History:  Procedure Laterality Date   2-D Echocardiogram  November 2009, 2011   Anal Fistula Repair     BIOPSY  07/14/2020   Procedure:  BIOPSY;  Surgeon: [name], MD;  Location: [facility] ENDOSCOPY;  Service: Gastroenterology;;   CARDIAC VALVE SURGERY  1996   status post balloon valvuloplasty at [facility]  2004   COLONOSCOPY  December 2005   ESOPHAGOGASTRODUODENOSCOPY  07/14/2020   ESOPHAGOGASTRODUODENOSCOPY (EGD) WITH PROPOFOL N/A 07/14/2020   Procedure: ESOPHAGOGASTRODUODENOSCOPY (EGD) WITH PROPOFOL;  Surgeon: [name], MD;  Location: [facility];  Service: Gastroenterology;  Laterality: N/A;   ETT  1991   Negative   HEMOSTASIS CLIP PLACEMENT  07/14/2020   HEMOSTASIS CLIP PLACEMENT   HEMOSTASIS CLIP PLACEMENT  07/14/2020   Procedure: HEMOSTASIS CLIP PLACEMENT;  Surgeon: [name], MD;  Location: [facility] ENDOSCOPY;  Service: Gastroenterology;;   KNEE ARTHROSCOPY     Right   TONSILLECTOMY      Allergies  Allergen Reactions   Protonix [Pantoprazole] Other (See Comments)    Hallucinations   Codeine Other (See Comments)    constpation   Ferrous Sulfate Itching and Swelling    Current Outpatient Medications  Medication Sig Dispense Refill   diphenhydramine-acetaminophen (TYLENOL PM EXTRA STRENGTH) 25-500 MG TABS tablet Take 500 mg by mouth at bedtime.     famotidine (PEPCID) 10 MG tablet Take 10 mg by mouth 2 (two) times daily.     furosemide (LASIX) 20 MG tablet Take 1 tablet (20 mg total) by mouth daily. 90 tablet 3   metoprolol tartrate (LOPRESSOR) 100 MG tablet Take 1 tablet (100 mg total) by mouth 2 (two) times daily.     polyethylene glycol powder (MIRALAX) 17 GM/SCOOP powder Take 17 g by mouth 2 (two) times daily as needed for moderate constipation. 255 g 2   acetaminophen (TYLENOL) 500 MG tablet Take 1 tablet (500 mg total) by mouth every 6 (six) hours as needed. 30 tablet 0   No current facility-administered medications for this visit.    Social History   Socioeconomic History   Marital status: Married    Spouse name: Not on file   Number of children: Not on file   Years of  education: Not on file   Highest education level: Not on file  Occupational History   Occupation: Retired    Fish farm manager: RETIRED  Tobacco Use   Smoking status: Never   Smokeless tobacco: Never  Vaping Use   Vaping Use: Never used  Substance and Sexual Activity   Alcohol use: No   Drug use: Never   Sexual activity: Not on file  Other Topics Concern   Not on file  Social History Narrative   Regular exercise: Yes: [employer] 4 times/week   Social  Determinants of Health   Financial Resource Strain: Low Risk    Difficulty of Paying Living Expenses: Not hard at all  Food Insecurity: No Food Insecurity   Worried About [place] in the Last Year: Never true   Ran Out of Food in the Last Year: Never true  Transportation Needs: No Transportation Needs   Lack of Transportation (Medical): No   Lack of Transportation (Non-Medical): No  Physical Activity: Insufficiently Active   Days of Exercise per Week: 5 days   Minutes of Exercise per Session: 10 min  Stress: No Stress Concern Present   Feeling of Stress : Not at all  Social Connections: Moderately Isolated   Frequency of Communication with Friends and Family: Three times a week   Frequency of Social Gatherings with Friends and Family: Never   Attends Religious Services: Never   Marine scientist or Organizations: No   Attends Music therapist: Never   Marital Status: Married  Human resources officer Violence: Not At Risk   Fear of Current or Ex-Partner: No   Emotionally Abused: No   Physically Abused: No   Sexually Abused: No    Family History  Problem Relation Age of Onset   Heart attack Father    Colon cancer Other    Diabetes Other    Prostate cancer Brother    Heart disease Other    Coronary artery disease Other        Two siblings, status post stenting to siblings with heart disease. One. Status post pacemaker insertion.   Stroke Brother     ROS General: Negative; No fevers, chills, or night  sweats HEENT: Negative; No changes in vision or hearing, sinus congestion, difficulty swallowing Pulmonary: Negative; No cough, wheezing, shortness of breath, hemoptysis Cardiovascular:  See HPI; No chest pain, presyncope, syncope, palpitations, edema GI:.  Positive for increased gas, diarrhea as well as constipation GU: Negative; No dysuria, hematuria, or difficulty voiding Musculoskeletal: Negative; no myalgias, joint pain, or weakness Hematologic/Oncologic: Negative; no easy bruising, bleeding Endocrine: Negative; no heat/cold intolerance; no diabetes Neuro: Negative; no changes in balance, headaches Skin: Negative; No rashes or skin lesions Psychiatric: Negative; No behavioral problems, depression Sleep: Negative; No daytime sleepiness, hypersomnolence, bruxism, restless legs, hypnogagnic hallucinations Other comprehensive 14 point system review is negative   Physical Exam BP 110/80 (BP Location: Left Arm)   Pulse 92   Ht 6' (1.829 m)   Wt 228 lb 6.4 oz (103.6 kg)   SpO2 94%   BMI 30.98 kg/m    Repeat blood pressure by me was 110/74  Wt Readings from Last 3 Encounters:  05/06/21 228 lb 6.4 oz (103.6 kg)  12/20/20 217 lb (98.4 kg)  09/17/20 218 lb (98.9 kg)   General: Alert, oriented, no distress.  Skin: normal turgor, no rashes, warm and dry HEENT: Normocephalic, atraumatic. Pupils equal round and reactive to light; sclera anicteric; extraocular muscles intact;  Nose without nasal septal hypertrophy Mouth/Parynx benign; Mallinpatti scale 3 Neck: No JVD, no carotid bruits; normal carotid upstroke Lungs: clear to ausculatation and percussion; no wheezing or rales Chest wall: without tenderness to palpitation Heart: PMI not displaced,irregular irregular c/w AF,, s1 s2 normal, 1/6 systolic murmur, no diastolic murmur, no rubs, gallops, thrills, or heaves Abdomen: soft, nontender; no hepatosplenomehaly, BS+; abdominal aorta nontender and not dilated by palpation. Back: no CVA  tenderness Pulses 2+ Musculoskeletal: full range of motion, normal strength, no joint deformities Extremities: 3+ RLE and 2+ LLE edema; no clubbing  cyanosis, Homan's sign negative  Neurologic: grossly nonfocal; Cranial nerves grossly wnl Psychologic: Normal mood and affect   ECG (independently read by me):  Atrial fibrillation at 92: QTc 442  March 2021 ECG (independently read by me): Atrial fibrilllation at 96; NSSTT changes; QTc 416 msec  September 03, 2018 ECG (independently read by me): Atrial  fibrillation at 88 bpm.  Nonspecific T wave abnormality.  June 25, 2018 ECG (independently read by me): Atrial fibrillation with ventricular rate of 123 bpm.  Nonspecific T wave abnormality.  December 20, 2017 ECG (independently read by me): Atrial fibrillation with ventricular rate at 97 bpm.  Small Q wave in 3.  Low voltage.  September 2018ECG (independently read by me): Atrial fibrillation with ventricular rate at approximately 100 bpm.  Q wave in aVF; low voltage.  Early transition.  No significant ST changes  March 2018 ECG (independently read by me): Atrial fibrillation with ventricular rate at 104 bpm.  QTc interval 433 ms.  September 2017 ECG (independently read by me): AF at 106 without significant STT changes.  LABS:  BMP Latest Ref Rng & Units 05/06/2021 08/10/2020 08/09/2020  Glucose 65 - 99 mg/dL 119(H) 89 149(H)  BUN 10 - 36 mg/dL 15 11 13   Creatinine 0.76 - 1.27 mg/dL 0.98 0.80 0.90  BUN/Creat Ratio 10 - 24 15 - -  Sodium 134 - 144 mmol/L 138 138 135  Potassium 3.5 - 5.2 mmol/L 4.5 3.4(L) 3.7  Chloride 96 - 106 mmol/L 102 106 106  CO2 20 - 29 mmol/L 20 24 21(L)  Calcium 8.6 - 10.2 mg/dL 9.0 8.8(L) 8.3(L)     Hepatic Function Latest Ref Rng & Units 05/06/2021 08/09/2020 07/23/2020  Total Protein 6.0 - 8.5 g/dL 6.6 6.1(L) 5.9(L)  Albumin 3.5 - 4.6 g/dL 4.2 3.7 -  AST 0 - 40 IU/L 17 24 17   ALT 0 - 44 IU/L 10 20 14   Alk Phosphatase 44 - 121 IU/L 97 72 -  Total Bilirubin 0.0  - 1.2 mg/dL 0.6 0.7 0.5  Bilirubin, Direct 0.0 - 0.3 mg/dL - - -    CBC Latest Ref Rng & Units 05/06/2021 09/14/2020 08/10/2020  WBC 3.4 - 10.8 x10E3/uL 8.1 8.2 6.2  Hemoglobin 13.0 - 17.7 g/dL 16.9 15.5 14.4  Hematocrit 37.5 - 51.0 % 49.4 46.2 45.0  Platelets 150 - 450 x10E3/uL 204 208.0 183   Lab Results  Component Value Date   MCV 94 05/06/2021   MCV 96.6 09/14/2020   MCV 103.7 (H) 08/10/2020   Lab Results  Component Value Date   TSH 3.650 05/06/2021   Lab Results  Component Value Date   HGBA1C 6.0 (H) 07/15/2020    MG 2.1  BNP    Component Value Date/Time   BNP 27.4 11/18/2015 1024    ProBNP No results found for: PROBNP   Lipid Panel     Component Value Date/Time   CHOL 197 09/11/2018 1045   TRIG 162 (H) 09/11/2018 1045   TRIG 150 (H) 09/06/2006 1031   HDL 39 (L) 09/11/2018 1045   CHOLHDL 5.1 (H) 09/11/2018 1045   CHOLHDL 5.0 06/16/2016 0946   VLDL 43 (H) 06/16/2016 0946   LDLCALC 126 (H) 09/11/2018 1045   LDLDIRECT 126.0 04/19/2015 1000    RADIOLOGY: No results found.   IMPRESSION:  1. Permanent atrial fibrillation ([place])   2. Bilateral lower extremity edema   3. Essential hypertension   4. Mild obesity   5. Medication management    ASSESSMENT AND PLAN:  Mr Guice is a 87 year old [place] male who has a history of hypertension, hyperlidemia and permanent AF.  An echo in 12/2014 was unchanged from a prior echo of 2011, and ejection fraction remained normal at 55-60%.  His left atrium was moderately dilated which was probably contributed by his atrial fibrillation. He continues to be on warfarin for anticoagulation with INH goal of 2.5. When I saw the patient in March 2018 , I had recommended titration of metoprolol for improved rate control and subsequently increase this to 50 mg twice a day.  He developed some dependent rubor with mild edema  and I suspect this may be contributed by mild venous insufficiency. He was given a prescription for HCTZ,  which he did not take and therefore I recommended support stockings.  Over the last several office visits, his metoprolol was titrated up to its present dose of 100 mg twice a day.  Remotely he was on Cardizem, there was some concern for gingival hypertrophy.  Although remotely had been on warfarin therapy, patient has been off warfarin now for over a year after he had insisted that he was no longer in atrial fibrillation.  When I last evaluated him, I recommended addition of enteric-coated aspirin 81 mg since he was no longer anticoagulated.  His ECG today shows atrial fibrillation reticular rate around 90.  He continues to have leg edema.  I am recommending the addition of furosemide 20 mg stat of his previous HCTZ which it does not appear he has been taking.  I am recommending follow-up laboratory with a comprehensive metabolic panel, CBC and TSH level.  I am scheduling him for follow-up 2D echo Doppler study to reassess systolic and diastolic function, chamber dimensions and valvular architecture.  He has an appointment to see his primary physician in September.  I will see him in 3 months for reevaluation.    Troy Sine, MD, [facility] 05/12/2021 6:27 PM

## 2021-05-12 ENCOUNTER — Encounter: Payer: Self-pay | Admitting: Cardiovascular Disease

## 2021-06-01 ENCOUNTER — Other Ambulatory Visit: Payer: Self-pay

## 2021-06-01 ENCOUNTER — Ambulatory Visit (HOSPITAL_COMMUNITY): Payer: Medicare Other | Attending: Cardiovascular Disease

## 2021-06-01 DIAGNOSIS — I4821 Permanent atrial fibrillation: Secondary | ICD-10-CM | POA: Diagnosis not present

## 2021-06-01 DIAGNOSIS — R600 Localized edema: Secondary | ICD-10-CM | POA: Diagnosis not present

## 2021-06-01 LAB — ECHOCARDIOGRAM COMPLETE
Area-P 1/2: 3.31 cm2
S' Lateral: 2.5 cm

## 2021-06-02 ENCOUNTER — Telehealth: Payer: Self-pay | Admitting: Cardiovascular Disease

## 2021-06-02 NOTE — Telephone Encounter (Signed)
Follow Up:      Patient received test results on My Chart yesterday. Would like for a nurse to call and go test results and explain it please.

## 2021-06-02 NOTE — Telephone Encounter (Signed)
Spoke with pt son and daughter-n-law, aware echo results have not been reviewed yet. When the patient is called, please remind the patient that he needs to walk. The patient is very anxious about hearing the results. Aware he will not be called today with the results as dr Claiborne Billings is seeing patient all day and it will probably be next week before he hears.

## 2021-06-24 ENCOUNTER — Encounter: Payer: Self-pay | Admitting: Family Medicine

## 2021-06-24 ENCOUNTER — Ambulatory Visit: Payer: Medicare Other | Admitting: Family Medicine

## 2021-06-24 NOTE — Telephone Encounter (Signed)
Patient appointment reschedule, unsure why was cancelled  Patient aware

## 2021-06-27 ENCOUNTER — Encounter: Payer: Self-pay | Admitting: Family Medicine

## 2021-06-27 ENCOUNTER — Telehealth: Payer: Self-pay | Admitting: *Deleted

## 2021-06-27 ENCOUNTER — Other Ambulatory Visit: Payer: Self-pay

## 2021-06-27 ENCOUNTER — Ambulatory Visit (INDEPENDENT_AMBULATORY_CARE_PROVIDER_SITE_OTHER): Payer: Medicare Other | Admitting: Family Medicine

## 2021-06-27 ENCOUNTER — Ambulatory Visit: Payer: Medicare Other | Admitting: Family Medicine

## 2021-06-27 VITALS — BP 113/74 | HR 80 | Temp 98.2°F | Ht 72.0 in | Wt 226.0 lb

## 2021-06-27 DIAGNOSIS — R110 Nausea: Secondary | ICD-10-CM | POA: Diagnosis not present

## 2021-06-27 DIAGNOSIS — I10 Essential (primary) hypertension: Secondary | ICD-10-CM | POA: Diagnosis not present

## 2021-06-27 DIAGNOSIS — R609 Edema, unspecified: Secondary | ICD-10-CM | POA: Diagnosis not present

## 2021-06-27 DIAGNOSIS — K279 Peptic ulcer, site unspecified, unspecified as acute or chronic, without hemorrhage or perforation: Secondary | ICD-10-CM | POA: Diagnosis not present

## 2021-06-27 DIAGNOSIS — Z23 Encounter for immunization: Secondary | ICD-10-CM | POA: Diagnosis not present

## 2021-06-27 DIAGNOSIS — I482 Chronic atrial fibrillation, unspecified: Secondary | ICD-10-CM | POA: Diagnosis not present

## 2021-06-27 DIAGNOSIS — R600 Localized edema: Secondary | ICD-10-CM

## 2021-06-27 MED ORDER — OMEPRAZOLE 40 MG PO CPDR: 40.0000 mg | DELAYED_RELEASE_CAPSULE | Freq: Every day | ORAL | 3 refills | Status: DC

## 2021-06-27 NOTE — Progress Notes (Signed)
   Glen [name] is a 87 y.o. male who presents today for an office visit.  Assessment/Plan:  Chronic Problems Addressed Today: Peripheral edema On Lasix per cardiology.  Minimal edema noted today.  He may be switching to every other day dosing.  Atrial fibrillation, chronic (HCC) No longer anticoagulated due to history of GI bleed and peptic ulcer disease.  He is rate controlled on metoprolol 1 mg twice daily.  Essential hypertension At goal on succinate 100 mg twice daily.  Peptic ulcer disease Will omeprazole 40 mg daily.  GI has prescribed this in the past and he has done well with it. He will be following up with GI soon.     Subjective:  HPI:  Patient here for follow-up.  Last seen 6 months ago. Since our last visit cardiology recommended Lasix 20 mg daily for lower extremity edema.  He has felt terrible the past several days. His legs have been feeling like they are being "drawn up". Additionally, he has been feeling nauseous when getting up in the morning. He took some protein supplement/shake today to try and prevent the feeling for today, and felt like he was going to be sick but ended up not.   He denies vomiting. He admits to experiencing heartburn. Eating food like bread gives him difficulty, and produces nausea  He was prescribed Furosemide 1x a day to alleviate his lower leg edema, which has been performing well. However, it seems to be contributing to the sensation of his legs being drawn up.        Objective:  Physical Exam: BP 113/74   Pulse 80   Temp 98.2 F (36.8 C) (Temporal)   Ht 6' (1.829 m)   Wt 226 lb (102.5 kg)   SpO2 98%   BMI 30.65 kg/m   Gen: No acute distress, resting comfortably CV: Irregular with no murmurs appreciated Pulm: Normal work of breathing, clear to auscultation bilaterally with no crackles, wheezes, or rhonchi Neuro: Grossly normal, moves all extremities Psych: Normal affect and thought content      I,Glen  [name],acting as a scribe for Dimas Chyle, MD.,have documented all relevant documentation on the behalf of Dimas Chyle, MD,as directed by  Dimas Chyle, MD while in the presence of Dimas Chyle, MD.  I, Dimas Chyle, MD, have reviewed all documentation for this visit. The documentation on 06/27/21 for the exam, diagnosis, procedures, and orders are all accurate and complete.  Glen [name]. Glen Pain, MD 06/27/2021 9:36 AM

## 2021-06-27 NOTE — Patient Instructions (Signed)
It was very nice to see you today!  Please start the omeprazole 40mg  daily. Please schedule an appointment with your dermatologist soon.  We will give you your flu vaccine today.  I will see you back in 6 months. Come back to see me sooner if needed.   Take care, Dr [name]  PLEASE NOTE:  If you had any lab tests please let us know if you have not heard back within a few days. You may see your results on mychart before we have a chance to review them but we will give you a call once they are reviewed by [place]. If we ordered any referrals today, please let us know if you have not heard from their office within the next week.   Please try these tips to maintain a healthy lifestyle:  Eat at least 3 REAL meals and 1-2 snacks per day.  Aim for no more than 5 hours between eating.  If you eat breakfast, please do so within one hour of getting up.   Each meal should contain half fruits/vegetables, one quarter protein, and one quarter carbs (no bigger than a computer mouse)  Cut down on sweet beverages. This includes juice, soda, and sweet tea.   Drink at least 1 glass of water with each meal and aim for at least 8 glasses per day  Exercise at least 150 minutes every week.

## 2021-06-27 NOTE — Telephone Encounter (Signed)
[place] with pepcid. Please send in rx if needed.  [name]. [name], MD 06/27/2021 11:07 AM

## 2021-06-27 NOTE — Assessment & Plan Note (Signed)
Will omeprazole 40 mg daily.  GI has prescribed this in the past and he has done well with it. He will be following up with GI soon.

## 2021-06-27 NOTE — Assessment & Plan Note (Signed)
On Lasix per cardiology.  Minimal edema noted today.  He may be switching to every other day dosing.

## 2021-06-27 NOTE — Assessment & Plan Note (Signed)
No longer anticoagulated due to history of GI bleed and peptic ulcer disease.  He is rate controlled on metoprolol 1 mg twice daily.

## 2021-06-27 NOTE — Assessment & Plan Note (Signed)
At goal on succinate 100 mg twice daily.

## 2021-06-27 NOTE — Telephone Encounter (Signed)
Patient stated does not want to take Rx Prilosec, his Gi recommended Pepcid in the past and will continue with Rx Pepcid since he had it in the past and since to be working

## 2021-06-30 ENCOUNTER — Encounter: Payer: Self-pay | Admitting: Family Medicine

## 2021-06-30 NOTE — Telephone Encounter (Signed)
Rx send

## 2021-07-20 NOTE — Telephone Encounter (Signed)
Spoke with patient on the phone regarding shoulder and back pain. Patient rates his pain at 5-6 on a 0-10 scale. He also stated if he stands for a while, he feels like he wants to "topple over". He uses his walker to steady himself or sits down to fell better. Stated his last BP at home was 112/80. Does not get any pain relief from Tylenol. Denies chest, arm, jaw pain.

## 2021-07-27 ENCOUNTER — Other Ambulatory Visit: Payer: Self-pay | Admitting: Cardiovascular Disease

## 2021-08-01 ENCOUNTER — Telehealth: Payer: Self-pay | Admitting: Cardiovascular Disease

## 2021-08-01 NOTE — Telephone Encounter (Signed)
Spoke to pt. He report for the past couple of months he's been experiencing a sick feeling. Pt really unable to describe symptoms other than stating he has a headache and stomach and back pain. He said he just overall feels sick. Pt state after walking around for a few minutes he feels like he's going to pass out once he sit. Pt denies rapid heart rate and report he doesn't check his BP during that time. Pt also state he leg swelling is better an wonder if he can decrease lasix to as needed

## 2021-08-01 NOTE — Telephone Encounter (Signed)
Patient is returning a call, per mychart message from 07/19/21

## 2021-08-08 ENCOUNTER — Other Ambulatory Visit: Payer: Self-pay

## 2021-08-08 ENCOUNTER — Encounter: Payer: Self-pay | Admitting: Cardiovascular Disease

## 2021-08-08 ENCOUNTER — Ambulatory Visit (INDEPENDENT_AMBULATORY_CARE_PROVIDER_SITE_OTHER): Payer: Medicare Other | Admitting: Cardiovascular Disease

## 2021-08-08 VITALS — BP 116/62 | HR 96 | Ht 72.0 in | Wt 230.6 lb

## 2021-08-08 DIAGNOSIS — Z79899 Other long term (current) drug therapy: Secondary | ICD-10-CM

## 2021-08-08 DIAGNOSIS — I4821 Permanent atrial fibrillation: Secondary | ICD-10-CM | POA: Diagnosis not present

## 2021-08-08 DIAGNOSIS — R600 Localized edema: Secondary | ICD-10-CM

## 2021-08-08 DIAGNOSIS — I4891 Unspecified atrial fibrillation: Secondary | ICD-10-CM

## 2021-08-08 DIAGNOSIS — I10 Essential (primary) hypertension: Secondary | ICD-10-CM

## 2021-08-08 DIAGNOSIS — E669 Obesity, unspecified: Secondary | ICD-10-CM

## 2021-08-08 MED ORDER — METOPROLOL TARTRATE 100 MG PO TABS: 100.0000 mg | ORAL_TABLET | Freq: Two times a day (BID) | ORAL | 3 refills | Status: DC

## 2021-08-08 MED ORDER — METOPROLOL TARTRATE 25 MG PO TABS: 25.0000 mg | ORAL_TABLET | Freq: Every morning | ORAL | 3 refills | Status: DC

## 2021-08-08 NOTE — Patient Instructions (Addendum)
Medication Instructions:  TAKE METOPROLOL 125 MG IN THE MORNING (ONE 100 MG TABLET AND ONE 25 MG TABLET)  TAKE METOPROLOL 100 MG TABLET IN THE EVENING.  *If you need a refill on your cardiac medications before your next appointment, please call your pharmacy*   Follow-Up: At [facility], you and your health needs are our priority.  As part of our continuing mission to provide you with exceptional heart care, we have created designated Provider Care Teams.  These Care Teams include your primary Cardiologist (physician) and Advanced Practice Providers (APPs -  Physician Assistants and Nurse Practitioners) who all work together to provide you with the care you need, when you need it.  We recommend signing up for the patient portal called "MyChart".  Sign up information is provided on this After Visit Summary.  MyChart is used to connect with patients for Virtual Visits (Telemedicine).  Patients are able to view lab/test results, encounter notes, upcoming appointments, etc.  Non-urgent messages can be sent to your provider as well.   To learn more about what you can do with MyChart, go to [email or web address].    Your next appointment:   6 month(s)  The format for your next appointment:   In Person  Provider:   Dr. Albin Fischer A SUPPORT STOCKING ON YOUR RIGHT LEG.

## 2021-08-08 NOTE — Progress Notes (Signed)
Primary MD: Andree Coss  PATIENT PROFILE: Glen [name] is a 87 y.o. male who is a former patient of Dr. Mare Ferrari who established care with me in September 2017.  He presents for 3 month follow-up cardiology evaluation.  HPI:  Glen [name] has a history of permanent AF, hypertension and hyperlipidemia. His AF has always been treated with rate control and he neveer had an attempt of cardioversion. He is on coumadin anticoagulation with a cha2ds2vasc score of 3. In the past he was treated with diltiazem and atenolol. He had gingival disease and was taken off diltiazem. Most recently he has been on metoprolol 50 mg bid for rate control but had reduced this to 25 mg bid since he felt that it had contributed to edema  He denies any awareness of sleep apnea. He does not exercise due to knee issues and gets injections into his kness intermittently. He last saw Dr. Mare Ferrari in 11/2015 and complained that his feet :were puffy on the bottom."   When I saw him, I did not feel that the metoprolol was contributing to his feet discomfort.  He had been switched to act to atenolol and most recently is now on metoprolol 50 mg in the morning and 25 mg, which she has been taking the early afternoon per Dr. Burnice Logan.  He continues to be on warfarin anticoagulation.  He continues to experience "puffiness "of his ankles.  At times he admits to eating food with increase sodium.  He denied chest pain, PND, orthopnea.  He denied bleeding.   He was having periods of diarrhea and constipation.  In the past, he was taken off Cardizem due to gingival hypertrophy by his dentist. He wanted to retry Cardizem for improved rate control along with metoprolol but ultimately stopped taking this.  Since I saw him he has been seen on 2 occasions by Dr. Harrell Gave with increased heart rate.  His beta-blocker regimen has been titrated to 50 mg twice a day and recently to 75 mg twice a day.  He has continued to be on warfarin  for anticoagulation.  He is unaware if his heart rhythm is fast. TSH was 4.8.    When I saw him in September 2019 his ventricular rate was increased and in the 120s.  At that time I further  titrated metoprolol to 100 mg twice a day.  I saw him in follow-up on September 03, 2018.  On his increased regimen he felt his heart rate had improved.  He denied any recent episodes of chest pain or shortness of breath.  At times there is some mild leg swelling.  He continued to be on warfarin for anticoagulation.  I saw him on December 04, 2019 and had not seen him since his prior evaluation on September 03, 2018.  Apparently recently he had undergone a warfarin check and INR was 4.2. at his primary MDs office The patient insists today that he was told his atrial fibrillation has resolved.  He emphatically stated he did not want to be on warfarin tomorrow.  He stated that he has not had a heart attack and has not had a stroke and he did not need warfarin.  During that evaluation he was given a prescription for HCTZ 12.5 mg due to peripheral edema to take as necessary.  He is followed by Dr. Dimas Chyle for primary care.  I last saw him on May 06, 2021 at which time he complained of leg swelling right  greater than left.  He denied any chest pain or palpitation.  He continues to be on Toprol tartrate 100 mg twice a day as well as Pepcid for GERD and MiraLAX.  During that evaluation, his ECG showed atrial fibrillation with a rate at 90.  He was not taking aspirin.  I recommended the addition of furosemide 20 mg instead of his HCTZ in light of his leg edema.  I recommended a follow-up echo Doppler study.  An echo Doppler study was done on June 01, 2021 which showed EF at 55 to 60%.  Right ventricular systolic function was normal.  His aortic valve was tricuspid and had mild calcification without stenosis and trivial aortic regurgitation.  He had moderate dilation of his ascending aorta at 45 mm and moderate dilatation of his  aortic root at 47 mm.  Presently, he tells me he had a bleeding ulcer.  He notes his heart rate typically in the 90s.  He continues to have leg swelling right greater than left.  He has not been using support stockings.  He presents for evaluation.  Past Medical History:  Diagnosis Date   Atrial fibrillation ([place])    Diverticulitis    Esophageal dysmotility    Family history of diabetes insipidus    FHx: colon cancer    H. pylori infection    History of colonic polyps    Hyperlipidemia    Hypertension    IDA (iron deficiency anemia)    Neoplasm of prostate    special screening malignant    Obesity    Palpitation    Peptic ulcer disease     Past Surgical History:  Procedure Laterality Date   2-D Echocardiogram  November 2009, 2011   Anal Fistula Repair     BIOPSY  07/14/2020   Procedure: BIOPSY;  Surgeon: [name], MD;  Location: [facility] ENDOSCOPY;  Service: Gastroenterology;;   CARDIAC VALVE SURGERY  1996   status post balloon valvuloplasty at [place]  2004   COLONOSCOPY  December 2005   ESOPHAGOGASTRODUODENOSCOPY  07/14/2020   ESOPHAGOGASTRODUODENOSCOPY (EGD) WITH PROPOFOL N/A 07/14/2020   Procedure: ESOPHAGOGASTRODUODENOSCOPY (EGD) WITH PROPOFOL;  Surgeon: [name], MD;  Location: [place];  Service: Gastroenterology;  Laterality: N/A;   ETT  1991   Negative   HEMOSTASIS CLIP PLACEMENT  07/14/2020   HEMOSTASIS CLIP PLACEMENT   HEMOSTASIS CLIP PLACEMENT  07/14/2020   Procedure: HEMOSTASIS CLIP PLACEMENT;  Surgeon: [name], MD;  Location: [facility] ENDOSCOPY;  Service: Gastroenterology;;   KNEE ARTHROSCOPY     Right   TONSILLECTOMY      Allergies  Allergen Reactions   Protonix [Pantoprazole] Other (See Comments)    Hallucinations   Codeine Other (See Comments)    constpation   Ferrous Sulfate Itching and Swelling    Current Outpatient Medications  Medication Sig Dispense Refill   acetaminophen (TYLENOL) 500 MG tablet Take 1 tablet  (500 mg total) by mouth every 6 (six) hours as needed. 30 tablet 0   diphenhydramine-acetaminophen (TYLENOL PM EXTRA STRENGTH) 25-500 MG TABS tablet Take 500 mg by mouth at bedtime.     famotidine (PEPCID) 10 MG tablet Take 10 mg by mouth 2 (two) times daily.     furosemide (LASIX) 20 MG tablet Take 1 tablet (20 mg total) by mouth daily. 90 tablet 3   polyethylene glycol powder (MIRALAX) 17 GM/SCOOP powder Take 17 g by mouth 2 (two) times daily as needed for moderate constipation. 255 g  2   metoprolol tartrate (LOPRESSOR) 100 MG tablet Take 1 tablet (100 mg total) by mouth 2 (two) times daily. 180 tablet 3   No current facility-administered medications for this visit.    Social History   Socioeconomic History   Marital status: Married    Spouse name: Not on file   Number of children: Not on file   Years of education: Not on file   Highest education level: Not on file  Occupational History   Occupation: Retired    Fish farm manager: RETIRED  Tobacco Use   Smoking status: Never   Smokeless tobacco: Never  Vaping Use   Vaping Use: Never used  Substance and Sexual Activity   Alcohol use: No   Drug use: Never   Sexual activity: Not on file  Other Topics Concern   Not on file  Social History Narrative   Regular exercise: Yes: [employer] 4 times/week   Social Determinants of Health   Financial Resource Strain: Low Risk    Difficulty of Paying Living Expenses: Not hard at all  Food Insecurity: No Food Insecurity   Worried About Charity fundraiser in the Last Year: Never true   Arboriculturist in the Last Year: Never true  Transportation Needs: No Transportation Needs   Lack of Transportation (Medical): No   Lack of Transportation (Non-Medical): No  Physical Activity: Insufficiently Active   Days of Exercise per Week: 5 days   Minutes of Exercise per Session: 10 min  Stress: No Stress Concern Present   Feeling of Stress : Not at all  Social Connections: Moderately Isolated   Frequency of  Communication with Friends and Family: Three times a week   Frequency of Social Gatherings with Friends and Family: Never   Attends Religious Services: Never   Marine scientist or Organizations: No   Attends Music therapist: Never   Marital Status: Married  Human resources officer Violence: Not At Risk   Fear of Current or Ex-Partner: No   Emotionally Abused: No   Physically Abused: No   Sexually Abused: No    Family History  Problem Relation Age of Onset   Heart attack Father    Colon cancer Other    Diabetes Other    Prostate cancer Brother    Heart disease Other    Coronary artery disease Other        Two siblings, status post stenting to siblings with heart disease. One. Status post pacemaker insertion.   Stroke Brother     ROS General: Negative; No fevers, chills, or night sweats HEENT: Negative; No changes in vision or hearing, sinus congestion, difficulty swallowing Pulmonary: Negative; No cough, wheezing, shortness of breath, hemoptysis Cardiovascular:  See HPI; leg edema right greater than left GI:.  Positive for increased gas, diarrhea as well as constipation GU: Negative; No dysuria, hematuria, or difficulty voiding Musculoskeletal: Knee discomfort Hematologic/Oncologic: Negative; no easy bruising, bleeding Endocrine: Negative; no heat/cold intolerance; no diabetes Neuro: Negative; no changes in balance, headaches Skin: Negative; No rashes or skin lesions Psychiatric: Negative; No behavioral problems, depression Sleep: Negative; No daytime sleepiness, hypersomnolence, bruxism, restless legs, hypnogagnic hallucinations Other comprehensive 14 point system review is negative   Physical Exam BP 116/62 (BP Location: Left Arm, Patient Position: Sitting, Cuff Size: Normal)   Pulse 96   Ht 6' (1.829 m)   Wt 230 lb 9.6 oz (104.6 kg)   SpO2 97%   BMI 31.27 kg/m    Repeat blood pressure by  me was 112/60  Wt Readings from Last 3 Encounters:  08/08/21  230 lb 9.6 oz (104.6 kg)  06/27/21 226 lb (102.5 kg)  05/06/21 228 lb 6.4 oz (103.6 kg)    General: Alert, oriented, no distress.  Skin: normal turgor, no rashes, warm and dry HEENT: Normocephalic, atraumatic. Pupils equal round and reactive to light; sclera anicteric; extraocular muscles intact;  Nose without nasal septal hypertrophy Mouth/Parynx benign; Mallinpatti scale 3 Neck: No JVD, no carotid bruits; normal carotid upstroke Lungs: clear to ausculatation and percussion; no wheezing or rales Chest wall: without tenderness to palpitation Heart: PMI not displaced, RRR, s1 s2 normal, 2/6 systolic murmur in aortic area, no diastolic murmur, no rubs, gallops, thrills, or heaves Abdomen: soft, nontender; no hepatosplenomehaly, BS+; abdominal aorta nontender and not dilated by palpation. Back: no CVA tenderness Pulses 2+ Musculoskeletal: full range of motion, normal strength, no joint deformities Extremities: 1+ pitting edema right lower extremity, less on the left, improved from prior evaluation; no clubbing cyanosis Homan's sign negative  Neurologic: grossly nonfocal; Cranial nerves grossly wnl Psychologic: Normal mood and affect   August 08, 2021 ECG (independently read by me):  Atria fibrillation at 96  May 06, 2021 ECG (independently read by me):  Atrial fibrillation at 92: QTc 442  March 2021 ECG (independently read by me): Atrial fibrilllation at 96; NSSTT changes; QTc 416 msec  September 03, 2018 ECG (independently read by me): Atrial  fibrillation at 88 bpm.  Nonspecific T wave abnormality.  June 25, 2018 ECG (independently read by me): Atrial fibrillation with ventricular rate of 123 bpm.  Nonspecific T wave abnormality.  December 20, 2017 ECG (independently read by me): Atrial fibrillation with ventricular rate at 97 bpm.  Small Q wave in 3.  Low voltage.  September 2018ECG (independently read by me): Atrial fibrillation with ventricular rate at approximately 100 bpm.   Q wave in aVF; low voltage.  Early transition.  No significant ST changes  March 2018 ECG (independently read by me): Atrial fibrillation with ventricular rate at 104 bpm.  QTc interval 433 ms.  September 2017 ECG (independently read by me): AF at 106 without significant STT changes.  LABS:  BMP Latest Ref Rng & Units 05/06/2021 08/10/2020 08/09/2020  Glucose 65 - 99 mg/dL 119(H) 89 149(H)  BUN 10 - 36 mg/dL 15 11 13   Creatinine 0.76 - 1.27 mg/dL 0.98 0.80 0.90  BUN/Creat Ratio 10 - 24 15 - -  Sodium 134 - 144 mmol/L 138 138 135  Potassium 3.5 - 5.2 mmol/L 4.5 3.4(L) 3.7  Chloride 96 - 106 mmol/L 102 106 106  CO2 20 - 29 mmol/L 20 24 21(L)  Calcium 8.6 - 10.2 mg/dL 9.0 8.8(L) 8.3(L)     Hepatic Function Latest Ref Rng & Units 05/06/2021 08/09/2020 07/23/2020  Total Protein 6.0 - 8.5 g/dL 6.6 6.1(L) 5.9(L)  Albumin 3.5 - 4.6 g/dL 4.2 3.7 -  AST 0 - 40 IU/L 17 24 17   ALT 0 - 44 IU/L 10 20 14   Alk Phosphatase 44 - 121 IU/L 97 72 -  Total Bilirubin 0.0 - 1.2 mg/dL 0.6 0.7 0.5  Bilirubin, Direct 0.0 - 0.3 mg/dL - - -    CBC Latest Ref Rng & Units 05/06/2021 09/14/2020 08/10/2020  WBC 3.4 - 10.8 x10E3/uL 8.1 8.2 6.2  Hemoglobin 13.0 - 17.7 g/dL 16.9 15.5 14.4  Hematocrit 37.5 - 51.0 % 49.4 46.2 45.0  Platelets 150 - 450 x10E3/uL 204 208.0 183   Lab Results  Component  Value Date   MCV 94 05/06/2021   MCV 96.6 09/14/2020   MCV 103.7 (H) 08/10/2020   Lab Results  Component Value Date   TSH 3.650 05/06/2021   Lab Results  Component Value Date   HGBA1C 6.0 (H) 07/15/2020    MG 2.1  BNP    Component Value Date/Time   BNP 27.4 11/18/2015 1024    ProBNP No results found for: PROBNP   Lipid Panel     Component Value Date/Time   CHOL 197 09/11/2018 1045   TRIG 162 (H) 09/11/2018 1045   TRIG 150 (H) 09/06/2006 1031   HDL 39 (L) 09/11/2018 1045   CHOLHDL 5.1 (H) 09/11/2018 1045   CHOLHDL 5.0 06/16/2016 0946   VLDL 43 (H) 06/16/2016 0946   LDLCALC 126 (H) 09/11/2018 1045    LDLDIRECT 126.0 04/19/2015 1000    RADIOLOGY: No results found.   IMPRESSION:  1. Permanent atrial fibrillation ([place])   2. Bilateral lower extremity edema   3. Essential hypertension   4. Mild obesity   5. Medication management     ASSESSMENT AND PLAN: Mr Dismore is a 87 year old [place] male who has a history of hypertension, hyperlidemia and permanent AF.  An echo in 12/2014 was unchanged from a prior echo of 2011, and ejection fraction remained normal at 55-60%.  His left atrium was moderately dilated which was probably contributed by his atrial fibrillation. He was on warfarin for anticoagulation with INH goal of 2.5. When I saw the patient in March 2018 , I had recommended titration of metoprolol for improved rate control and subsequently increase this to 50 mg twice a day.  He developed some dependent rubor with mild edema  and I suspect this may be contributed by mild venous insufficiency. He was given a prescription for HCTZ, which he did not take and therefore I recommended support stockings.  Over the last several office visits, his metoprolol was titrated up to its present dose of 100 mg twice a day.  Remotely he was on Cardizem, there was some concern for gingival hypertrophy.  He has now been off anticoagulation for approximately a year and a when I previously saw him he was convinced he was no longer in atrial fibrillation.  However he has continued to have longstanding persistent AF.  Once he was off warfarin 81 mg aspirin was recommended.  He is not on this currently and apparently had bleeding issues.  Presently, his ventricular rate is in the upper 90s.  I have recommended slight additional titration of metoprolol to tartrate to 125 mg in the morning and he will continue with the 100 mg in the evening.  His most recent echo Doppler which shows EF of 55 to 60% with indeterminant diastolic parameters.  He did have aortic sclerosis with trivial AR and moderate dilatation of his  aortic root at 47 mm with moderate dilation of his ascending aorta at 45 mm.  He continues to be on furosemide 20 mg daily I have recommended support stockings.  He continues to be on Pepcid for GERD.  He is on MiraLAX.  Laboratory from May 06, 2021 was reviewed.  TSH was 3.65.  CBC was stable with hemoglobin 60.9 hematocrit 49.4.  Renal function remained stable with creatinine 0.98.  LFTs were normal.  I will see him in 6 months for reevaluation or sooner as needed.    Troy Sine, MD, [facility] 08/14/2021 5:26 PM

## 2021-08-10 ENCOUNTER — Telehealth: Payer: Self-pay

## 2021-08-10 DIAGNOSIS — Z23 Encounter for immunization: Secondary | ICD-10-CM | POA: Diagnosis not present

## 2021-08-10 NOTE — Telephone Encounter (Signed)
Patient called in stating that he received his 5th booster, [place] today.

## 2021-08-11 ENCOUNTER — Telehealth: Payer: Self-pay | Admitting: Cardiovascular Disease

## 2021-08-11 NOTE — Telephone Encounter (Signed)
   Pt requesting to speak with Dr. Evette Georges nurse. He said he still not feeling well since he saw Dr. Claiborne Billings on 08/08/21

## 2021-08-11 NOTE — Telephone Encounter (Signed)
Called patient, he states that since the increase of his Metoprolol he has not felt the best. He woke up and was not feeling good, he states this feeling lasted about 6 hours, and then went away. He did not check his BP/HR during this episode. Patient states he feels fine now but wanted to make Dr.Kelly aware. I recommended that he take his BP if he has another episode like this.   Per last note for Dr.Kelly he recommended blood work- I did not see any ordered so we will check with Dr.Kelly on if he wanted this done.   Thanks!  Last OV note plan from Dr.Kelly on 11/07:   When I last evaluated him, I recommended addition of enteric-coated aspirin 81 mg since he was no longer anticoagulated.  His ECG today shows atrial fibrillation reticular rate around 90.  He continues to have leg edema.  I am recommending the addition of furosemide 20 mg stat of his previous HCTZ which it does not appear he has been taking.  I am recommending follow-up laboratory with a comprehensive metabolic panel, CBC and TSH level.  I am scheduling him for follow-up 2D echo Doppler study to reassess systolic and diastolic function, chamber dimensions and valvular architecture.  He has an appointment to see his primary physician in September.  I will see him in 3 months for reevaluation.

## 2021-08-11 NOTE — Telephone Encounter (Signed)
See phone note

## 2021-08-11 NOTE — Telephone Encounter (Signed)
Records updated.

## 2021-08-12 NOTE — Telephone Encounter (Signed)
Attempted to contact patient back to discuss further.  Unable to leave a voicemail, will try again.

## 2021-08-12 NOTE — Telephone Encounter (Signed)
Recommend patient reduce metoprolol back to previous dose for the weekend to see if that makes him feel better.  However his symptoms are likely not related to metoprolol since they were present before he increased the medication

## 2021-08-12 NOTE — Telephone Encounter (Signed)
Spoke with pt, aware of the recommendations. He will call if he does not feel better.

## 2021-08-12 NOTE — Telephone Encounter (Signed)
Spoke with pt, he feels that the metoprolol is making him sick. He reports pain in his stomach and headache but also reports he felt sick before it was increased. He wants to be changed off the metoprolol to something else.  Tried to explain to the patient did not think it was related but he wants his medication changed. Aware will forward to the pharm md to try to help with med change.

## 2021-08-12 NOTE — Telephone Encounter (Signed)
See previous telephone notes in chart.  We will call patient back to discuss further treatment options.

## 2021-08-12 NOTE — Addendum Note (Signed)
Addended by: Cristopher Estimable on: 08/12/2021 04:54 PM   Modules accepted: Orders

## 2021-08-12 NOTE — Telephone Encounter (Signed)
Patient called back this morning to follow up with Dr. Evette Georges Nurse. He said he is still not felling well and he does not know what to do

## 2021-08-12 NOTE — Telephone Encounter (Signed)
Unable to reach pt or leave a message  

## 2021-08-14 ENCOUNTER — Encounter: Payer: Self-pay | Admitting: Cardiovascular Disease

## 2021-10-17 ENCOUNTER — Ambulatory Visit: Payer: Medicare Other

## 2021-10-20 DIAGNOSIS — Z20822 Contact with and (suspected) exposure to covid-19: Secondary | ICD-10-CM | POA: Diagnosis not present

## 2021-11-02 DIAGNOSIS — Z20822 Contact with and (suspected) exposure to covid-19: Secondary | ICD-10-CM | POA: Diagnosis not present

## 2021-12-26 ENCOUNTER — Encounter: Payer: Self-pay | Admitting: Family Medicine

## 2021-12-26 ENCOUNTER — Ambulatory Visit (INDEPENDENT_AMBULATORY_CARE_PROVIDER_SITE_OTHER): Payer: Medicare Other | Admitting: Family Medicine

## 2021-12-26 ENCOUNTER — Ambulatory Visit (INDEPENDENT_AMBULATORY_CARE_PROVIDER_SITE_OTHER): Payer: Medicare Other

## 2021-12-26 VITALS — BP 118/76 | HR 57 | Wt 217.3 lb

## 2021-12-26 VITALS — BP 118/76 | HR 83 | Temp 98.0°F | Ht 72.0 in | Wt 217.2 lb

## 2021-12-26 DIAGNOSIS — E785 Hyperlipidemia, unspecified: Secondary | ICD-10-CM | POA: Diagnosis not present

## 2021-12-26 DIAGNOSIS — Z0000 Encounter for general adult medical examination without abnormal findings: Secondary | ICD-10-CM | POA: Diagnosis not present

## 2021-12-26 DIAGNOSIS — K279 Peptic ulcer, site unspecified, unspecified as acute or chronic, without hemorrhage or perforation: Secondary | ICD-10-CM

## 2021-12-26 DIAGNOSIS — R609 Edema, unspecified: Secondary | ICD-10-CM | POA: Diagnosis not present

## 2021-12-26 DIAGNOSIS — I10 Essential (primary) hypertension: Secondary | ICD-10-CM

## 2021-12-26 DIAGNOSIS — I482 Chronic atrial fibrillation, unspecified: Secondary | ICD-10-CM

## 2021-12-26 LAB — COMPREHENSIVE METABOLIC PANEL
ALT: 20 U/L (ref 0–53)
AST: 23 U/L (ref 0–37)
Albumin: 4 g/dL (ref 3.5–5.2)
Alkaline Phosphatase: 71 U/L (ref 39–117)
BUN: 22 mg/dL (ref 6–23)
CO2: 25 mEq/L (ref 19–32)
Calcium: 9.6 mg/dL (ref 8.4–10.5)
Chloride: 107 mEq/L (ref 96–112)
Creatinine, Ser: 0.99 mg/dL (ref 0.40–1.50)
GFR: 66.44 mL/min (ref >60.00)
Glucose, Bld: 153 mg/dL — ABNORMAL HIGH (ref 70–99)
Potassium: 5.1 mEq/L (ref 3.5–5.1)
Sodium: 140 mEq/L (ref 135–145)
Total Bilirubin: 0.7 mg/dL (ref 0.2–1.2)
Total Protein: 6.6 g/dL (ref 6.0–8.3)

## 2021-12-26 LAB — CBC
HCT: 48.5 % (ref 39.0–52.0)
Hemoglobin: 16 g/dL (ref 13.0–17.0)
MCHC: 33.1 g/dL (ref 30.0–36.0)
MCV: 98.2 fl (ref 78.0–100.0)
Platelets: 195 10*3/uL (ref 150.0–400.0)
RBC: 4.94 Mil/uL (ref 4.22–5.81)
RDW: 13.5 % (ref 11.5–15.5)
WBC: 8.9 10*3/uL (ref 4.0–10.5)

## 2021-12-26 LAB — LIPID PANEL
Cholesterol: 132 mg/dL (ref 0–200)
HDL: 36.8 mg/dL — ABNORMAL LOW (ref >39.00)
LDL Cholesterol: 74 mg/dL (ref 0–99)
NonHDL: 94.78
Total CHOL/HDL Ratio: 4
Triglycerides: 106 mg/dL (ref 0.0–149.0)
VLDL: 21.2 mg/dL (ref 0.0–40.0)

## 2021-12-26 NOTE — Progress Notes (Signed)
? ?  RAYBURN MUNDIS is a 88 y.o. male who presents today for an office visit. ? ?Assessment/Plan:  ?New/Acute Problems: ?Malaise ?History is limited - not clear what symptoms patient is describing. His exam today is reassuring and he does not have any current symptoms. We will check blood work. We discussed reasons to return to care and seek emergent care.  ? ?Chronic Problems Addressed Today: ?Peripheral edema ?Continue lasix '20mg'$  daily as needed. Check labs.  ? ?Atrial fibrillation, chronic ([place]) ?No longer on anticoaulgation due to history of GI bleed. Continue rate control with metoprolol tartrate '100mg'$  twice daily. ? ?Essential hypertension ?At goal today on metoprolol tartrate '100mg'$  bid.  ? ?Dyslipidemia ?Check labs. ? ?Peptic ulcer disease ?He will follow up with GI soon. Continue pepcid '10mg'$  twice daily.  ? ? ?  ?Subjective:  ?HPI: ? ?See A/p for status of chronic conditions. ? ?Has had intermittent issues with being "sick" for the last several months. This happens intermittently. It is hard for him to describe his "sick" sensation. No nausea or vomiting. No pain. No constipation or diarrhea. No headache. No melena or hematochezia. Occasionally gets dizzy if he stands up too fast. He thinks he is trying to get plenty of fluids. No recent illnesses. No fevers or chills.  Lasts for a few minutes and then goes away. No obvious precipitating or aggravating factors. No alleviating factors.  ? ?   ?  ?Objective:  ?Physical Exam: ?BP 118/76 (BP Location: Left Arm)   Pulse 83   Temp 98 ?F (36.7 ?C) (Temporal)   Ht 6' (1.829 m)   Wt 217 lb 3.2 oz (98.5 kg)   SpO2 97%   BMI 29.46 kg/m?   ?Gen: No acute distress, resting comfortably ?CV: Irregular with no murmurs appreciated ?Pulm: Normal work of breathing, clear to auscultation bilaterally with no crackles, wheezes, or rhonchi ?Neuro: Grossly normal, moves all extremities ?Psych: Normal affect and thought content ? ?   ? ?[name]. [name], MD ?12/26/2021 9:00 AM   ?

## 2021-12-26 NOTE — Patient Instructions (Signed)
Mr. Centola , ?Thank you for taking time to come for your Medicare Wellness Visit. I appreciate your ongoing commitment to your health goals. Please review the following plan we discussed and let me know if I can assist you in the future.  ? ?Screening recommendations/referrals: ?Colonoscopy: No longer required  ?Recommended yearly ophthalmology/optometry visit for glaucoma screening and checkup ?Recommended yearly dental visit for hygiene and checkup ? ?Vaccinations: ?Influenza vaccine: Done 06/27/21 repeat every year  ?Pneumococcal vaccine: Up to date ?Tdap vaccine: Due and discussed  ?Shingles vaccine: Shingrix discussed. Please contact your pharmacy for coverage information.    ?Covid-19: Completed 1/22, 2/12, 07/01/20 & 08/10/21 ? ?Advanced directives: Please bring a copy of your health care power of attorney and living will to the office at your convenience. ? ?Conditions/risks identified: None at this time  ? ?Next appointment: Follow up in one year for your annual wellness visit.  ? ?Preventive Care 5 Years and Older, Male ?Preventive care refers to lifestyle choices and visits with your health care provider that can promote health and wellness. ?What does preventive care include? ?A yearly physical exam. This is also called an annual well check. ?Dental exams once or twice a year. ?Routine eye exams. Ask your health care provider how often you should have your eyes checked. ?Personal lifestyle choices, including: ?Daily care of your teeth and gums. ?Regular physical activity. ?Eating a healthy diet. ?Avoiding tobacco and drug use. ?Limiting alcohol use. ?Practicing safe sex. ?Taking low doses of aspirin every day. ?Taking vitamin and mineral supplements as recommended by your health care provider. ?What happens during an annual well check? ?The services and screenings done by your health care provider during your annual well check will depend on your age, overall health, lifestyle risk factors, and family  history of disease. ?Counseling  ?Your health care provider may ask you questions about your: ?Alcohol use. ?Tobacco use. ?Drug use. ?Emotional well-being. ?Home and relationship well-being. ?Sexual activity. ?Eating habits. ?History of falls. ?Memory and ability to understand (cognition). ?Work and work Statistician. ?Screening  ?You may have the following tests or measurements: ?Height, weight, and BMI. ?Blood pressure. ?Lipid and cholesterol levels. These may be checked every 5 years, or more frequently if you are over 82 years old. ?Skin check. ?Lung cancer screening. You may have this screening every year starting at age 58 if you have a 30-pack-year history of smoking and currently smoke or have quit within the past 15 years. ?Fecal occult blood test (FOBT) of the stool. You may have this test every year starting at age 62. ?Flexible sigmoidoscopy or colonoscopy. You may have a sigmoidoscopy every 5 years or a colonoscopy every 10 years starting at age 39. ?Prostate cancer screening. Recommendations will vary depending on your family history and other risks. ?Hepatitis C blood test. ?Hepatitis B blood test. ?Sexually transmitted disease (STD) testing. ?Diabetes screening. This is done by checking your blood sugar (glucose) after you have not eaten for a while (fasting). You may have this done every 1-3 years. ?Abdominal aortic aneurysm (AAA) screening. You may need this if you are a current or former smoker. ?Osteoporosis. You may be screened starting at age 55 if you are at high risk. ?Talk with your health care provider about your test results, treatment options, and if necessary, the need for more tests. ?Vaccines  ?Your health care provider may recommend certain vaccines, such as: ?Influenza vaccine. This is recommended every year. ?Tetanus, diphtheria, and acellular pertussis (Tdap, Td) vaccine. You may need  a Td booster every 10 years. ?Zoster vaccine. You may need this after age 61. ?Pneumococcal  13-valent conjugate (PCV13) vaccine. One dose is recommended after age 77. ?Pneumococcal polysaccharide (PPSV23) vaccine. One dose is recommended after age 18. ?Talk to your health care provider about which screenings and vaccines you need and how often you need them. ?This information is not intended to replace advice given to you by your health care provider. Make sure you discuss any questions you have with your health care provider. ?Document Released: 10/15/2015 Document Revised: 06/07/2016 Document Reviewed: 07/20/2015 ?Elsevier Interactive Patient Education ? 2017 [place]. ? ?Fall Prevention in the Home ?Falls can cause injuries. They can happen to people of all ages. There are many things you can do to make your home safe and to help prevent falls. ?What can I do on the outside of my home? ?Regularly fix the edges of walkways and driveways and fix any cracks. ?Remove anything that might make you trip as you walk through a door, such as a raised step or threshold. ?Trim any bushes or trees on the path to your home. ?Use bright outdoor lighting. ?Clear any walking paths of anything that might make someone trip, such as rocks or tools. ?Regularly check to see if handrails are loose or broken. Make sure that both sides of any steps have handrails. ?Any raised decks and porches should have guardrails on the edges. ?Have any leaves, snow, or ice cleared regularly. ?Use sand or salt on walking paths during winter. ?Clean up any spills in your garage right away. This includes oil or grease spills. ?What can I do in the bathroom? ?Use night lights. ?Install grab bars by the toilet and in the tub and shower. Do not use towel bars as grab bars. ?Use non-skid mats or decals in the tub or shower. ?If you need to sit down in the shower, use a plastic, non-slip stool. ?Keep the floor dry. Clean up any water that spills on the floor as soon as it happens. ?Remove soap buildup in the tub or shower regularly. ?Attach  bath mats securely with double-sided non-slip rug tape. ?Do not have throw rugs and other things on the floor that can make you trip. ?What can I do in the bedroom? ?Use night lights. ?Make sure that you have a light by your bed that is easy to reach. ?Do not use any sheets or blankets that are too big for your bed. They should not hang down onto the floor. ?Have a firm chair that has side arms. You can use this for support while you get dressed. ?Do not have throw rugs and other things on the floor that can make you trip. ?What can I do in the kitchen? ?Clean up any spills right away. ?Avoid walking on wet floors. ?Keep items that you use a lot in easy-to-reach places. ?If you need to reach something above you, use a strong step stool that has a grab bar. ?Keep electrical cords out of the way. ?Do not use floor polish or wax that makes floors slippery. If you must use wax, use non-skid floor wax. ?Do not have throw rugs and other things on the floor that can make you trip. ?What can I do with my stairs? ?Do not leave any items on the stairs. ?Make sure that there are handrails on both sides of the stairs and use them. Fix handrails that are broken or loose. Make sure that handrails are as long as the stairways. ?Check  any carpeting to make sure that it is firmly attached to the stairs. Fix any carpet that is loose or worn. ?Avoid having throw rugs at the top or bottom of the stairs. If you do have throw rugs, attach them to the floor with carpet tape. ?Make sure that you have a light switch at the top of the stairs and the bottom of the stairs. If you do not have them, ask someone to add them for you. ?What else can I do to help prevent falls? ?Wear shoes that: ?Do not have high heels. ?Have rubber bottoms. ?Are comfortable and fit you well. ?Are closed at the toe. Do not wear sandals. ?If you use a stepladder: ?Make sure that it is fully opened. Do not climb a closed stepladder. ?Make sure that both sides of the  stepladder are locked into place. ?Ask someone to hold it for you, if possible. ?Clearly mark and make sure that you can see: ?Any grab bars or handrails. ?First and last steps. ?Where the edge of each step is. ?[place]

## 2021-12-26 NOTE — Assessment & Plan Note (Signed)
Continue lasix '20mg'$  daily as needed. Check labs.  ?

## 2021-12-26 NOTE — Patient Instructions (Signed)
It was very nice to see you today! ? ?We will check blood work today. ? ?I will see you back in 6 months. Come back sooner if needed.  ? ?Take care, ?Dr [name] ? ?PLEASE NOTE: ? ?If you had any lab tests please let us know if you have not heard back within a few days. You may see your results on mychart before we have a chance to review them but we will give you a call once they are reviewed by [place]. If we ordered any referrals today, please let us know if you have not heard from their office within the next week.  ? ?Please try these tips to maintain a healthy lifestyle: ? ?Eat at least 3 REAL meals and 1-2 snacks per day.  Aim for no more than 5 hours between eating.  If you eat breakfast, please do so within one hour of getting up.  ? ?Each meal should contain half fruits/vegetables, one quarter protein, and one quarter carbs (no bigger than a computer mouse) ? ?Cut down on sweet beverages. This includes juice, soda, and sweet tea.  ? ?Drink at least 1 glass of water with each meal and aim for at least 8 glasses per day ? ?Exercise at least 150 minutes every week.   ?

## 2021-12-26 NOTE — Assessment & Plan Note (Signed)
No longer on anticoaulgation due to history of GI bleed. Continue rate control with metoprolol tartrate '100mg'$  twice daily. ?

## 2021-12-26 NOTE — Assessment & Plan Note (Signed)
At goal today on metoprolol tartrate '100mg'$  bid.  ?

## 2021-12-26 NOTE — Assessment & Plan Note (Signed)
Check labs 

## 2021-12-26 NOTE — Assessment & Plan Note (Signed)
He will follow up with GI soon. Continue pepcid '10mg'$  twice daily.  ?

## 2021-12-26 NOTE — Progress Notes (Addendum)
? ?Subjective:  ? Glen [name] is a 88 y.o. male who presents for Medicare Annual/Subsequent preventive examination. ? ?Review of Systems    ? ?Cardiac Risk Factors include: advanced age (>46mn, >>32women);hypertension;dyslipidemia;male gender ? ?   ?Objective:  ?  ?Today's Vitals  ? 12/26/21 0904  ?BP: 118/76  ?Pulse: (!) 57  ?SpO2: 97%  ?Weight: 217 lb 4.8 oz (98.6 kg)  ? ?Body mass index is 29.47 kg/m?. ? ? ?  12/26/2021  ?  9:28 AM 10/11/2020  ?  3:17 PM 09/17/2020  ? 10:58 AM 08/09/2020  ?  7:53 PM 08/09/2020  ? 12:23 PM 07/14/2020  ?  3:20 PM 07/14/2020  ?  3:17 PM  ?Advanced Directives  ?Does Patient Have a Medical Advance Directive? Yes Yes Yes Yes No Yes   ?Type of Advance Directive Living will [place]minenceLiving will [place]ian[place] will Living will;Healthcare Power of [place]  ?Does patient want to make changes to medical advance directive?   No - Patient declined No - Patient declined  No - Patient declined   ?Copy of [place] Chart?  No - copy requested  No - copy requested  No - copy requested   ?Would patient like information on creating a medical advance directive?    No - Patient declined No - Patient declined No - Patient declined No - Patient declined  ? ? ?Current Medications (verified) ?Outpatient Encounter Medications as of 12/26/2021  ?Medication Sig  ? acetaminophen (TYLENOL) 500 MG tablet Take 1 tablet (500 mg total) by mouth every 6 (six) hours as needed.  ? diphenhydramine-acetaminophen (TYLENOL PM EXTRA STRENGTH) 25-500 MG TABS tablet Take 500 mg by mouth at bedtime.  ? famotidine (PEPCID) 10 MG tablet Take 10 mg by mouth 2 (two) times daily.  ? furosemide (LASIX) 20 MG tablet Take 1 tablet (20 mg total) by mouth daily.  ? metoprolol tartrate (LOPRESSOR) 100 MG tablet Take 1 tablet (100 mg total) by mouth 2 (two) times daily.  ? polyethylene glycol powder (MIRALAX) 17 GM/SCOOP powder Take 17 g by  mouth 2 (two) times daily as needed for moderate constipation.  ? ?No facility-administered encounter medications on file as of 12/26/2021.  ? ? ?Allergies (verified) ?Protonix [pantoprazole], Codeine, and Ferrous sulfate  ? ?History: ?Past Medical History:  ?Diagnosis Date  ? Atrial fibrillation ([place]   ? Diverticulitis   ? Esophageal dysmotility   ? Family history of diabetes insipidus   ? FHx: colon cancer   ? H. pylori infection   ? History of colonic polyps   ? Hyperlipidemia   ? Hypertension   ? IDA (iron deficiency anemia)   ? Neoplasm of prostate   ? special screening malignant   ? Obesity   ? Palpitation   ? Peptic ulcer disease   ? ?Past Surgical History:  ?Procedure Laterality Date  ? 2-D Echocardiogram  November 2009, 2011  ? Anal Fistula Repair    ? BIOPSY  07/14/2020  ? Procedure: BIOPSY;  Surgeon: PJerene Bears MD;  [facility];  Service: Gastroenterology;;  ? [facility] ? [facility] ? CATARACT EXTRACTION  2004  ? COLONOSCOPY  December 2005  ? ESOPHAGOGASTRODUODENOSCOPY  07/14/2020  ? ESOPHAGOGASTRODUODENOSCOPY (EGD) WITH PROPOFOL N/A 07/14/2020  ? Procedure: ESOPHAGOGASTRODUODENOSCOPY (EGD) WITH PROPOFOL;  Surgeon: PJerene Bears MD;  [facility];  Service: Gastroenterology;  Laterality: N/A;  ? ETT  1991  ? Negative  ? HEMOSTASIS CLIP PLACEMENT  07/14/2020  ? HEMOSTASIS CLIP PLACEMENT  ? HEMOSTASIS CLIP PLACEMENT  07/14/2020  ? Procedure: HEMOSTASIS CLIP PLACEMENT;  Surgeon: [name], MD;  [facility];  Service: Gastroenterology;;  ? KNEE ARTHROSCOPY    ? Right  ? TONSILLECTOMY    ? ?Family History  ?Problem Relation Age of Onset  ? Heart attack Father   ? Colon cancer Other   ? Diabetes Other   ? Prostate cancer Brother   ? Heart disease Other   ? Coronary artery disease Other   ?     Two siblings, status post stenting to siblings with heart disease. One. Status post pacemaker insertion.  ? Stroke Brother    ? ?Social History  ? ?Socioeconomic History  ? Marital status: Married  ?  Spouse name: Not on file  ? Number of children: Not on file  ? Years of education: Not on file  ? Highest education level: Not on file  ?Occupational History  ? Occupation: Retired  ?  Employer: RETIRED  ?Tobacco Use  ? Smoking status: Never  ? Smokeless tobacco: Never  ?Vaping Use  ? Vaping Use: Never used  ?Substance and Sexual Activity  ? Alcohol use: No  ? Drug use: Never  ? Sexual activity: Not on file  ?Other Topics Concern  ? Not on file  ?Social History Narrative  ? Regular exercise: Yes: [employer] 4 times/week  ? ?Social Determinants of Health  ? ?Financial Resource Strain: Low Risk   ? Difficulty of Paying Living Expenses: Not hard at all  ?Food Insecurity: No Food Insecurity  ? Worried About Charity fundraiser in the Last Year: Never true  ? Ran Out of Food in the Last Year: Never true  ?Transportation Needs: No Transportation Needs  ? Lack of Transportation (Medical): No  ? Lack of Transportation (Non-Medical): No  ?Physical Activity: Insufficiently Active  ? Days of Exercise per Week: 5 days  ? Minutes of Exercise per Session: 10 min  ?Stress: No Stress Concern Present  ? Feeling of Stress : Not at all  ?Social Connections: Moderately Isolated  ? Frequency of Communication with Friends and Family: More than three times a week  ? Frequency of Social Gatherings with Friends and Family: Once a week  ? Attends Religious Services: Never  ? Active Member of Clubs or Organizations: No  ? Attends Archivist Meetings: Never  ? Marital Status: Married  ? ? ?Tobacco Counseling ?Counseling given: Not Answered ? ? ?Clinical Intake: ? ?Pre-visit preparation completed: Yes ? ?Pain : No/denies pain ? ?  ? ?BMI - recorded: 29.47 ?Nutritional Status: BMI 25 -29 Overweight ?Nutritional Risks: None ?Diabetes: No ? ?How often do you need to have someone help you when you read instructions, pamphlets, or other written materials from your doctor  or pharmacy?: 1 - Never ? ?Diabetic?no ? ?Interpreter Needed?: No ? ?Information entered by :: Charlott Rakes, LPN ? ? ?Activities of Daily Living ? ?  12/26/2021  ?  9:30 AM  ?In your present state of health, do you have any difficulty performing the following activities:  ?Hearing? 0  ?Vision? 0  ?Difficulty concentrating or making decisions? 0  ?Walking or climbing stairs? 0  ?Comment use banister to help and take my time  ?Dressing or bathing? 0  ?Doing errands, shopping? 0  ?Preparing Food and eating ? N  ?Using the Toilet? N  ?  In the past six months, have you accidently leaked urine? N  ?Do you have problems with loss of bowel control? N  ?Managing your Medications? N  ?Managing your Finances? N  ?Housekeeping or managing your Housekeeping? N  ? ? ?Patient Care Team: ?[name], MD as PCP - General (Family Medicine) ?Troy Sine, MD as PCP - Cardiology (Cardiology) ?Sharyon Medicus, MD as Rounding Team (Internal Medicine) ? ?Indicate any recent Medical Services you may have received from other than [facility] providers in the past year (date may be approximate). ? ?   ?Assessment:  ? This is a routine wellness examination for Glen [name]. ? ?Hearing/Vision screen ?Hearing Screening - Comments:: Pt denies any hearing issues  ?Vision Screening - Comments:: Pt follows up with Dr Gershon Crane for annual eye exams  ? ?Dietary issues and exercise activities discussed: ?Current Exercise Habits: Home exercise routine, Type of exercise: Other - see comments;walking (and moving legs), Time (Minutes): 10, Frequency (Times/Week): 5, Weekly Exercise (Minutes/Week): 50 ? ? Goals Addressed   ? ?  ?  ?  ?  ? This Visit's Progress  ?  Patient Stated     ?  None at this time  ?  ? ?  ? ?Depression Screen ? ?  12/26/2021  ?  9:27 AM 06/27/2021  ? 10:02 AM 10/11/2020  ?  3:15 PM 07/23/2020  ?  1:13 PM 07/23/2020  ? 11:43 AM 06/25/2019  ? 10:32 AM 07/29/2018  ? 10:11 AM  ?PHQ 2/9 Scores  ?PHQ - 2 Score 0 0 0 1 1 0 0  ?PHQ- 9 Score  2  7     ?   ?Fall Risk ? ?  12/26/2021  ?  9:30 AM 10/11/2020  ?  3:19 PM 06/25/2019  ? 10:32 AM 06/04/2019  ? 10:11 AM 07/29/2018  ? 10:11 AM  ?Fall Risk   ?Falls in the past year? 0 0 1 1 No  ?Number falls in past yr:

## 2021-12-27 ENCOUNTER — Telehealth: Payer: Self-pay | Admitting: Family Medicine

## 2021-12-27 LAB — TSH: TSH: 2.83 u[IU]/mL (ref 0.35–5.50)

## 2021-12-27 NOTE — Telephone Encounter (Signed)
FYI

## 2021-12-27 NOTE — Telephone Encounter (Signed)
Pt wanted to let Dr [name] to know that after labs, he went home and was extremely tired. He stated he has never been that tired before.  ?

## 2021-12-29 ENCOUNTER — Encounter: Payer: Self-pay | Admitting: Cardiovascular Disease

## 2021-12-29 NOTE — Progress Notes (Signed)
Please inform patient of the following: ? ?Good news! Labs are all stable. We can recheck in a year or so. ? ?[name]. [name], MD ?12/29/2021 3:26 PM  ?

## 2021-12-30 NOTE — Telephone Encounter (Signed)
Returned call to pt informed RPH comment, he insists that these side effects are from the lasix. He wants to be changed to another medication. Informed that he cannot stop this medication this is for fluid status. Pt insists to change this medication. Informed pt to call PCP for his diarrhea.  ?Pt notified that Dr Claiborne Billings is on Vacation, he will wait until his return. ?

## 2022-01-04 DIAGNOSIS — Z20822 Contact with and (suspected) exposure to covid-19: Secondary | ICD-10-CM | POA: Diagnosis not present

## 2022-01-13 ENCOUNTER — Encounter: Payer: Self-pay | Admitting: Cardiovascular Disease

## 2022-01-13 ENCOUNTER — Telehealth: Payer: Self-pay | Admitting: Cardiovascular Disease

## 2022-01-13 NOTE — Telephone Encounter (Signed)
?*  STAT* If patient is at the pharmacy, call can be transferred to refill team. ? ? ?1. Which medications need to be refilled? (please list name of each medication and dose if known) furosemide (LASIX) 20 MG tablet ? ?2. Which pharmacy/location (including street and city if local pharmacy) is medication to be sent to? [place], [place] ? ?3. Do they need a 30 day or 90 day supply? 90 day ? ?

## 2022-01-15 ENCOUNTER — Telehealth: Payer: Self-pay | Admitting: Nurse Practitioner

## 2022-01-15 ENCOUNTER — Inpatient Hospital Stay (HOSPITAL_BASED_OUTPATIENT_CLINIC_OR_DEPARTMENT_OTHER)
Admission: EM | Admit: 2022-01-15 | Discharge: 2022-01-17 | DRG: 392 | Disposition: A | Discharge status: Home / Self Care | Payer: Medicare Other | Attending: Student | Admitting: Student

## 2022-01-15 ENCOUNTER — Encounter (HOSPITAL_BASED_OUTPATIENT_CLINIC_OR_DEPARTMENT_OTHER): Payer: Self-pay | Admitting: Emergency Medicine

## 2022-01-15 ENCOUNTER — Other Ambulatory Visit: Payer: Self-pay

## 2022-01-15 DIAGNOSIS — K2289 Other specified disease of esophagus: Secondary | ICD-10-CM | POA: Diagnosis not present

## 2022-01-15 DIAGNOSIS — K2980 Duodenitis without bleeding: Secondary | ICD-10-CM | POA: Diagnosis present

## 2022-01-15 DIAGNOSIS — Z8042 Family history of malignant neoplasm of prostate: Secondary | ICD-10-CM | POA: Diagnosis not present

## 2022-01-15 DIAGNOSIS — K449 Diaphragmatic hernia without obstruction or gangrene: Secondary | ICD-10-CM | POA: Diagnosis present

## 2022-01-15 DIAGNOSIS — R600 Localized edema: Secondary | ICD-10-CM | POA: Diagnosis present

## 2022-01-15 DIAGNOSIS — R609 Edema, unspecified: Secondary | ICD-10-CM | POA: Diagnosis present

## 2022-01-15 DIAGNOSIS — Z800 Family history of malignant neoplasm of digestive organs: Secondary | ICD-10-CM

## 2022-01-15 DIAGNOSIS — Z823 Family history of stroke: Secondary | ICD-10-CM

## 2022-01-15 DIAGNOSIS — Z8249 Family history of ischemic heart disease and other diseases of the circulatory system: Secondary | ICD-10-CM | POA: Diagnosis not present

## 2022-01-15 DIAGNOSIS — R1314 Dysphagia, pharyngoesophageal phase: Secondary | ICD-10-CM | POA: Diagnosis not present

## 2022-01-15 DIAGNOSIS — Z833 Family history of diabetes mellitus: Secondary | ICD-10-CM

## 2022-01-15 DIAGNOSIS — K2970 Gastritis, unspecified, without bleeding: Secondary | ICD-10-CM | POA: Diagnosis not present

## 2022-01-15 DIAGNOSIS — R1310 Dysphagia, unspecified: Secondary | ICD-10-CM | POA: Diagnosis not present

## 2022-01-15 DIAGNOSIS — I517 Cardiomegaly: Secondary | ICD-10-CM | POA: Diagnosis not present

## 2022-01-15 DIAGNOSIS — I4891 Unspecified atrial fibrillation: Secondary | ICD-10-CM | POA: Diagnosis not present

## 2022-01-15 DIAGNOSIS — K2950 Unspecified chronic gastritis without bleeding: Secondary | ICD-10-CM | POA: Diagnosis not present

## 2022-01-15 DIAGNOSIS — K3189 Other diseases of stomach and duodenum: Secondary | ICD-10-CM | POA: Diagnosis not present

## 2022-01-15 DIAGNOSIS — R1319 Other dysphagia: Secondary | ICD-10-CM | POA: Insufficient documentation

## 2022-01-15 DIAGNOSIS — Z6830 Body mass index (BMI) 30.0-30.9, adult: Secondary | ICD-10-CM

## 2022-01-15 DIAGNOSIS — Z885 Allergy status to narcotic agent status: Secondary | ICD-10-CM

## 2022-01-15 DIAGNOSIS — R5381 Other malaise: Secondary | ICD-10-CM

## 2022-01-15 DIAGNOSIS — Z888 Allergy status to other drugs, medicaments and biological substances status: Secondary | ICD-10-CM

## 2022-01-15 DIAGNOSIS — Q399 Congenital malformation of esophagus, unspecified: Secondary | ICD-10-CM | POA: Diagnosis not present

## 2022-01-15 DIAGNOSIS — E860 Dehydration: Secondary | ICD-10-CM

## 2022-01-15 DIAGNOSIS — D509 Iron deficiency anemia, unspecified: Secondary | ICD-10-CM | POA: Diagnosis present

## 2022-01-15 DIAGNOSIS — E785 Hyperlipidemia, unspecified: Secondary | ICD-10-CM | POA: Diagnosis present

## 2022-01-15 DIAGNOSIS — K224 Dyskinesia of esophagus: Secondary | ICD-10-CM | POA: Diagnosis present

## 2022-01-15 DIAGNOSIS — Z8711 Personal history of peptic ulcer disease: Secondary | ICD-10-CM

## 2022-01-15 DIAGNOSIS — K2990 Gastroduodenitis, unspecified, without bleeding: Secondary | ICD-10-CM | POA: Diagnosis not present

## 2022-01-15 DIAGNOSIS — I482 Chronic atrial fibrillation, unspecified: Secondary | ICD-10-CM | POA: Diagnosis present

## 2022-01-15 DIAGNOSIS — K2100 Gastro-esophageal reflux disease with esophagitis, without bleeding: Secondary | ICD-10-CM | POA: Diagnosis present

## 2022-01-15 DIAGNOSIS — E669 Obesity, unspecified: Secondary | ICD-10-CM | POA: Diagnosis present

## 2022-01-15 DIAGNOSIS — Z01818 Encounter for other preprocedural examination: Secondary | ICD-10-CM | POA: Diagnosis not present

## 2022-01-15 DIAGNOSIS — R112 Nausea with vomiting, unspecified: Secondary | ICD-10-CM | POA: Diagnosis not present

## 2022-01-15 DIAGNOSIS — K279 Peptic ulcer, site unspecified, unspecified as acute or chronic, without hemorrhage or perforation: Secondary | ICD-10-CM | POA: Diagnosis present

## 2022-01-15 DIAGNOSIS — K222 Esophageal obstruction: Secondary | ICD-10-CM | POA: Diagnosis present

## 2022-01-15 DIAGNOSIS — I10 Essential (primary) hypertension: Secondary | ICD-10-CM | POA: Diagnosis not present

## 2022-01-15 DIAGNOSIS — T18128A Food in esophagus causing other injury, initial encounter: Secondary | ICD-10-CM | POA: Diagnosis not present

## 2022-01-15 DIAGNOSIS — Z8546 Personal history of malignant neoplasm of prostate: Secondary | ICD-10-CM

## 2022-01-15 DIAGNOSIS — Z79899 Other long term (current) drug therapy: Secondary | ICD-10-CM

## 2022-01-15 DIAGNOSIS — K209 Esophagitis, unspecified without bleeding: Secondary | ICD-10-CM | POA: Diagnosis not present

## 2022-01-15 LAB — COMPREHENSIVE METABOLIC PANEL
ALT: 21 U/L (ref 0–44)
AST: 23 U/L (ref 15–41)
Albumin: 4.6 g/dL (ref 3.5–5.0)
Alkaline Phosphatase: 77 U/L (ref 38–126)
Anion gap: 15 (ref 5–15)
BUN: 22 mg/dL (ref 8–23)
CO2: 19 mmol/L — ABNORMAL LOW (ref 22–32)
Calcium: 9.8 mg/dL (ref 8.9–10.3)
Chloride: 105 mmol/L (ref 98–111)
Creatinine, Ser: 1.08 mg/dL (ref 0.61–1.24)
GFR, Estimated: >60 mL/min (ref >60)
Glucose, Bld: 103 mg/dL — ABNORMAL HIGH (ref 70–99)
Potassium: 4 mmol/L (ref 3.5–5.1)
Sodium: 139 mmol/L (ref 135–145)
Total Bilirubin: 1.3 mg/dL — ABNORMAL HIGH (ref 0.3–1.2)
Total Protein: 7.4 g/dL (ref 6.5–8.1)

## 2022-01-15 LAB — CBC WITH DIFFERENTIAL/PLATELET
Abs Immature Granulocytes: 0.03 10*3/uL (ref 0.00–0.07)
Basophils Absolute: 0.1 10*3/uL (ref 0.0–0.1)
Basophils Relative: 1 %
Eosinophils Absolute: 0.1 10*3/uL (ref 0.0–0.5)
Eosinophils Relative: 1 %
HCT: 52.6 % — ABNORMAL HIGH (ref 39.0–52.0)
Hemoglobin: 17.3 g/dL — ABNORMAL HIGH (ref 13.0–17.0)
Immature Granulocytes: 0 %
Lymphocytes Relative: 19 %
Lymphs Abs: 2.3 10*3/uL (ref 0.7–4.0)
MCH: 32 pg (ref 26.0–34.0)
MCHC: 32.9 g/dL (ref 30.0–36.0)
MCV: 97.2 fL (ref 80.0–100.0)
Monocytes Absolute: 0.8 10*3/uL (ref 0.1–1.0)
Monocytes Relative: 7 %
Neutro Abs: 8.6 10*3/uL — ABNORMAL HIGH (ref 1.7–7.7)
Neutrophils Relative %: 72 %
Platelets: 213 10*3/uL (ref 150–400)
RBC: 5.41 MIL/uL (ref 4.22–5.81)
RDW: 13.2 % (ref 11.5–15.5)
WBC: 11.9 10*3/uL — ABNORMAL HIGH (ref 4.0–10.5)
nRBC: 0 % (ref 0.0–0.2)

## 2022-01-15 LAB — URINALYSIS, ROUTINE W REFLEX MICROSCOPIC
Bilirubin Urine: NEGATIVE
Glucose, UA: NEGATIVE mg/dL
Ketones, ur: 15 mg/dL — AB
Leukocytes,Ua: NEGATIVE
Nitrite: NEGATIVE
Specific Gravity, Urine: 1.024 (ref 1.005–1.030)
pH: 5 (ref 5.0–8.0)

## 2022-01-15 LAB — LIPASE, BLOOD: Lipase: 18 U/L (ref 11–51)

## 2022-01-15 MED ORDER — METOPROLOL TARTRATE 5 MG/5ML IV SOLN: 2.5000 mg | Freq: Four times a day (QID) | INTRAVENOUS | Status: DC | PRN

## 2022-01-15 MED ORDER — ONDANSETRON HCL 4 MG PO TABS: 4.0000 mg | ORAL_TABLET | Freq: Four times a day (QID) | ORAL | Status: DC | PRN

## 2022-01-15 MED ORDER — LEVALBUTEROL HCL 0.63 MG/3ML IN NEBU: 0.6300 mg | INHALATION_SOLUTION | Freq: Four times a day (QID) | RESPIRATORY_TRACT | Status: DC | PRN

## 2022-01-15 MED ORDER — SODIUM CHLORIDE 0.9 % IV SOLN: Freq: Once | INTRAVENOUS | Status: AC

## 2022-01-15 MED ORDER — LACTATED RINGERS IV BOLUS
1000.0000 mL | Freq: Once | INTRAVENOUS | Status: AC
  Administered 2022-01-15: 1000 mL via INTRAVENOUS

## 2022-01-15 MED ORDER — ONDANSETRON HCL 4 MG/2ML IJ SOLN: 4.0000 mg | Freq: Four times a day (QID) | INTRAMUSCULAR | Status: DC | PRN

## 2022-01-15 MED ORDER — ONDANSETRON HCL 4 MG/2ML IJ SOLN
4.0000 mg | Freq: Once | INTRAMUSCULAR | Status: AC
  Administered 2022-01-15: 4 mg via INTRAVENOUS
  Filled 2022-01-15: qty 2

## 2022-01-15 MED ORDER — LACTATED RINGERS IV SOLN: INTRAVENOUS | Status: DC

## 2022-01-15 NOTE — H&P (Signed)
?History and Physical  ? ? ?JAMARRIUS SALAY VOH:[id number] DOB: [birth year] DOA: 01/15/2022 ? ?PCP: [name], MD  ?Patient coming [facility] ?I have personally briefly reviewed patient's old medical records in [place] ? ?Chief Complaint: dysphagia x weeks now with n/v over last few days ? ?HPI: Glen [name] is a 88 y.o. male with medical history significant of  chronic Afib not on AC due to gi bleed, GI bleed due to hx of PUD, HTN, HLD, diverticulosis,IDA, esophageal dysmotility who presents to ED with dysphagia ongoing for weeks but worse over the last 3 days after eating "sticky rice" patient now has increase n/v and inability to tolerated po.  ? ? ?ED Course:  ?Patient on evaluation in ED was discussed with GI who recommended admission for ivfs and EGD evaluation in am.  ? ?Vitals  ?Afeb, bp 115/84, hr 119, rrr18, sat 97% on ra ?Labs: ?Wbc 11.9, Hgb17.3, plt 213, pmn8.6 ?Na 139, K 4, cr1.08 at base, bicarb 19 /was 25 ?EKG: Afib  rate 89  ?UA:Wbc 6-10, +bacteria  ?Tx zofran, LR 1L ?Review of Systems: As per HPI otherwise 10 point review of systems negative.  ? ?Past Medical History:  ?Diagnosis Date  ? Atrial fibrillation ([place])   ? Diverticulitis   ? Esophageal dysmotility   ? Family history of diabetes insipidus   ? FHx: colon cancer   ? H. pylori infection   ? History of colonic polyps   ? Hyperlipidemia   ? Hypertension   ? IDA (iron deficiency anemia)   ? Neoplasm of prostate   ? special screening malignant   ? Obesity   ? Palpitation   ? Peptic ulcer disease   ? ? ?Past Surgical History:  ?Procedure Laterality Date  ? 2-D Echocardiogram  November 2009, 2011  ? Anal Fistula Repair    ? BIOPSY  07/14/2020  ? Procedure: BIOPSY;  Surgeon: [name], MD;  Location: [facility] ENDOSCOPY;  Service: Gastroenterology;;  ? [place]  ? status post balloon valvuloplasty at [facility]  ? CATARACT EXTRACTION  2004  ? COLONOSCOPY  December 2005  ? ESOPHAGOGASTRODUODENOSCOPY  07/14/2020  ?  ESOPHAGOGASTRODUODENOSCOPY (EGD) WITH PROPOFOL N/A 07/14/2020  ? Procedure: ESOPHAGOGASTRODUODENOSCOPY (EGD) WITH PROPOFOL;  Surgeon: [name], MD;  Location: [facility] ENDOSCOPY;  Service: Gastroenterology;  Laterality: N/A;  ? ETT  1991  ? Negative  ? HEMOSTASIS CLIP PLACEMENT  07/14/2020  ? HEMOSTASIS CLIP PLACEMENT  ? HEMOSTASIS CLIP PLACEMENT  07/14/2020  ? Procedure: HEMOSTASIS CLIP PLACEMENT;  Surgeon: [name], MD;  Location: [facility] ENDOSCOPY;  Service: Gastroenterology;;  ? KNEE ARTHROSCOPY    ? Right  ? TONSILLECTOMY    ? ? ? reports that he has never smoked. He has never used smokeless tobacco. He reports that he does not drink alcohol and does not use drugs. ? ?Allergies  ?Allergen Reactions  ? Protonix [Pantoprazole] Other (See Comments)  ?  Hallucinations  ? Codeine Other (See Comments)  ?  constpation  ? Ferrous Sulfate Itching and Swelling  ? ? ?Family History  ?Problem Relation Age of Onset  ? Heart attack Father   ? Colon cancer Other   ? Diabetes Other   ? Prostate cancer Brother   ? Heart disease Other   ? Coronary artery disease Other   ?     Two siblings, status post stenting to siblings with heart disease. One. Status post pacemaker insertion.  ? Stroke Brother   ? ? ?  Prior to Admission medications   ?Medication Sig Start Date End Date Taking? Authorizing Provider  ?diphenhydramine-acetaminophen (TYLENOL PM EXTRA STRENGTH) 25-500 MG TABS tablet Take 3 mg by mouth at bedtime. 07/17/20  Yes [provider]  ?furosemide (LASIX) 20 MG tablet Take 1 tablet (20 mg total) by mouth daily. 05/06/21  Yes Troy Sine, MD  ?metoprolol tartrate (LOPRESSOR) 100 MG tablet Take 1 tablet (100 mg total) by mouth 2 (two) times daily. 08/08/21  Yes Troy Sine, MD  ?polyethylene glycol powder (MIRALAX) 17 GM/SCOOP powder Take 17 g by mouth 2 (two) times daily as needed for moderate constipation. ?Patient not taking: Reported on 01/15/2022 07/17/20   Mercy Riding, MD  ? ? ?Physical Exam: ?Vitals:  ?  01/15/22 2015 01/15/22 2100 01/15/22 2115 01/15/22 2210  ?BP: 129/79 116/80 119/74 120/90  ?Pulse: 97  97 91  ?Resp: '18 19 13 16  '$ ?Temp:    97.7 ?F (36.5 ?C)  ?TempSrc:    Oral  ?SpO2: 98% 100% 98% 99%  ?Weight:    97.2 kg  ?Height:      ? ? ? ?Vitals:  ? 01/15/22 2015 01/15/22 2100 01/15/22 2115 01/15/22 2210  ?BP: 129/79 116/80 119/74 120/90  ?Pulse: 97  97 91  ?Resp: '18 19 13 16  '$ ?Temp:    97.7 ?F (36.5 ?C)  ?TempSrc:    Oral  ?SpO2: 98% 100% 98% 99%  ?Weight:    97.2 kg  ?Height:      ?Constitutional: NAD, calm, comfortable ?Eyes: PERRL, lids and conjunctivae normal ?ENMT: Mucous membranes are moist. Posterior pharynx clear of any exudate or lesions.Normal dentition.  ?Neck: normal, supple, no masses, no thyromegaly ?Respiratory: clear to auscultation bilaterally, no wheezing, no crackles. Normal respiratory effort. No accessory muscle use.  ?Cardiovascular: Regular rate and rhythm, no murmurs / rubs / gallops.trace extremity edema. Extremities warm, No carotid bruits.  ?Abdomen: no tenderness, no masses palpated. No hepatosplenomegaly. Bowel sounds positive.  ?Musculoskeletal: no clubbing / cyanosis. No joint deformity upper and lower extremities. Good ROM, no contractures. Normal muscle tone.  ?Skin: no rashes, lesions, ulcers. No induration ?Neurologic: CN 2-12 grossly intact. Sensation intact, Strength 5/5 in all 4.  ?Psychiatric: Normal judgment and insight. Alert and oriented x 3. Normal mood.  ? ? ?Labs on Admission: I have personally reviewed following labs and imaging studies ? ?CBC: ?Recent Labs  ?Lab 01/15/22 ?1658  ?WBC 11.9*  ?NEUTROABS 8.6*  ?HGB 17.3*  ?HCT 52.6*  ?MCV 97.2  ?PLT 213  ? ?Basic Metabolic Panel: ?Recent Labs  ?Lab 01/15/22 ?1658  ?NA 139  ?K 4.0  ?CL 105  ?CO2 19*  ?GLUCOSE 103*  ?BUN 22  ?CREATININE 1.08  ?CALCIUM 9.8  ? ?GFR: ?Estimated Creatinine Clearance: 53.8 mL/min (by C-G formula based on SCr of 1.08 mg/dL). ?Liver Function Tests: ?Recent Labs  ?Lab 01/15/22 ?1658  ?AST 23   ?ALT 21  ?ALKPHOS 77  ?BILITOT 1.3*  ?PROT 7.4  ?ALBUMIN 4.6  ? ?Recent Labs  ?Lab 01/15/22 ?1658  ?LIPASE 18  ? ?No results for input(s): AMMONIA in the last 168 hours. ?Coagulation Profile: ?No results for input(s): INR, PROTIME in the last 168 hours. ?Cardiac Enzymes: ?No results for input(s): CKTOTAL, CKMB, CKMBINDEX, TROPONINI in the last 168 hours. ?BNP (last 3 results) ?No results for input(s): PROBNP in the last 8760 hours. ?HbA1C: ?No results for input(s): HGBA1C in the last 72 hours. ?CBG: ?No results for input(s): GLUCAP in the last 168 hours. ?Lipid Profile: ?  No results for input(s): CHOL, HDL, LDLCALC, TRIG, CHOLHDL, LDLDIRECT in the last 72 hours. ?Thyroid Function Tests: ?No results for input(s): TSH, T4TOTAL, FREET4, T3FREE, THYROIDAB in the last 72 hours. ?Anemia Panel: ?No results for input(s): VITAMINB12, FOLATE, FERRITIN, TIBC, IRON, RETICCTPCT in the last 72 hours. ?Urine analysis: ?   ?Component Value Date/Time  ? [place] YELLOW 01/15/2022 1927  ? APPEARANCEUR CLEAR 01/15/2022 1927  ? LABSPEC 1.024 01/15/2022 1927  ? PHURINE 5.0 01/15/2022 1927  ? GLUCOSEU NEGATIVE 01/15/2022 1927  ? HGBUR TRACE (A) 01/15/2022 1927  ? [place] NEGATIVE 01/15/2022 1927  ? KETONESUR 15 (A) 01/15/2022 1927  ? PROTEINUR TRACE (A) 01/15/2022 1927  ? UROBILINOGEN 0.2 05/27/2013 0029  ? NITRITE NEGATIVE 01/15/2022 1927  ? LEUKOCYTESUR NEGATIVE 01/15/2022 1927  ? ? ?Radiological Exams on Admission: ?No results found. ? ?EKG: Independently reviewed.see above ? ?Assessment/Plan ?Intractable n/v ?Progressive dysphagia  ?-now with intractable n/v  ?-insetting of esophageal dysmotility, hx of PUD/esophagitis  ?-concern for stricture vs progressive dysmotility/returned food ?-admit med tele  ?-gi to see in am ?-ppi bid iv ?-npo  ?-plan for edg in am  ?-supportive care for n/v  ? ?Afib  ?-continue rate control with lopressor  iv prn due to inability to tolerate po ?-of AC due to gi bleed  ? ?PUD with recent hx GI  bleed ?-ppi bid iv ? ?IDA ?-stable  ?-monitor  ? ?Mild dehydration  ?Ivfs  ? ?Abn UA ?-send for culture  ?-no current urinary symptoms  ?-if spike fever consider abx  ? ?HTN/HLD ?-no acute issues  ?-holding non

## 2022-01-15 NOTE — ED Triage Notes (Signed)
Pt c/o vomiting x 4 days. Pt unable to tolerate PO food or fluids. ?

## 2022-01-15 NOTE — ED Notes (Signed)
[facility] called for transport. 

## 2022-01-15 NOTE — Telephone Encounter (Signed)
I received a message for on-call service from the patient due to having vomiting and diarrhea.  I called the provided phone number [phone] and this line was busy, I called his mobile phone [phone] that successfully reaching the patient.  I am currently rounding at [facility] with multiple admissions, therefore I will reattempt to contact the patient at the provided numbers as soon as possible ?

## 2022-01-15 NOTE — ED Notes (Signed)
Pt unable to tolerate PO challenge,  Provider aware.  ?

## 2022-01-15 NOTE — Telephone Encounter (Signed)
Glen [name],  ? ?Refer to phone message below, note dictation error. I was NOT successful with reaching the patient by phone earlier today. ? ? ?Pls contact patient 01/16/2022 to follow up as he went to the ED Sunday 01/15/2022, obtain sx update and inform Dr. Hilarie Fredrickson so he can determined appropriate follow up. THX ?

## 2022-01-15 NOTE — ED Provider Notes (Signed)
?[place] EMERGENCY DEPT ?Provider Note ? ? ?CSN: [id number] ?Arrival date & time: 01/15/22  1300 ? ?  ? ?History ? ?Chief Complaint  ?Patient presents with  ? Emesis  ? ? ?Glen [name] is a 88 y.o. male with a pertinent history of atrial fibrillation, hypertension, hyperlipidemia, obesity, diverticulitis, peptic ulcer disease, esophageal dysmotility.  Presents to the emergency department with a chief plaint of nausea, vomiting, and diarrhea. ? ?Patient reports that he has had nausea, vomiting, and diarrhea over the last 4 days.  States symptoms started after eating lunch.  Patient states that he has not been able to tolerate much p.o. intake due to his symptoms.  Patient has been able to take minimal amounts of fluid and some of his pills.  Patient reports that emesis looks like stomach contents.  Denies any hematemesis or coffee-ground emesis.  Patient reports that diarrhea has been "liquid stool."  Denies any hematochezia, blood in stool, or melena. ? ?Patient's family member at bedside reports that patient has had history of esophageal strictures requiring dilation.  They are concerned that he may be having the same issue now.  Patient has contacted his gastroenterologist and is awaiting to hear from them. ? ?Patient denies any recent antibiotics, international travel, or camping.  Patient denies any illicit drug use or alcohol use. ? ? ?Emesis ?Associated symptoms: diarrhea   ?Associated symptoms: no abdominal pain, no chills, no fever and no headaches   ? ?  ? ?Home Medications ?Prior to Admission medications   ?Medication Sig Start Date End Date Taking? Authorizing Provider  ?acetaminophen (TYLENOL) 500 MG tablet Take 1 tablet (500 mg total) by mouth every 6 (six) hours as needed. 05/06/21   Troy Sine, MD  ?diphenhydramine-acetaminophen (TYLENOL PM EXTRA STRENGTH) 25-500 MG TABS tablet Take 500 mg by mouth at bedtime. 07/17/20   [provider]  ?famotidine (PEPCID) 10 MG tablet  Take 10 mg by mouth 2 (two) times daily.    [provider]  ?furosemide (LASIX) 20 MG tablet Take 1 tablet (20 mg total) by mouth daily. 05/06/21   Troy Sine, MD  ?metoprolol tartrate (LOPRESSOR) 100 MG tablet Take 1 tablet (100 mg total) by mouth 2 (two) times daily. 08/08/21   Troy Sine, MD  ?polyethylene glycol powder (MIRALAX) 17 GM/SCOOP powder Take 17 g by mouth 2 (two) times daily as needed for moderate constipation. 07/17/20   Mercy Riding, MD  ?   ? ?Allergies    ?Protonix [pantoprazole], Codeine, and Ferrous sulfate   ? ?Review of Systems   ?Review of Systems  ?Constitutional:  Negative for chills and fever.  ?Eyes:  Negative for visual disturbance.  ?Respiratory:  Negative for shortness of breath.   ?Cardiovascular:  Negative for chest pain.  ?Gastrointestinal:  Positive for diarrhea, nausea and vomiting. Negative for abdominal distention, abdominal pain, anal bleeding, blood in stool, constipation and rectal pain.  ?Genitourinary:  Negative for difficulty urinating, dysuria, flank pain, frequency, hematuria, penile discharge, penile pain, penile swelling, scrotal swelling, testicular pain and urgency.  ?Musculoskeletal:  Negative for back pain and neck pain.  ?Skin:  Negative for color change and rash.  ?Neurological:  Negative for dizziness, syncope, light-headedness and headaches.  ?Psychiatric/Behavioral:  Negative for confusion.   ? ?Physical Exam ?Updated Vital Signs ?BP 129/78   Pulse 97   Temp 97.7 ?F (36.5 ?C)   Resp 19   Ht 6' (1.829 m)   Wt 98.6 kg   SpO2 97%  BMI 29.47 kg/m?  ?Physical Exam ?Vitals and nursing note reviewed.  ?Constitutional:   ?   General: He is not in acute distress. ?   Appearance: He is not ill-appearing, toxic-appearing or diaphoretic.  ?HENT:  ?   Head: Normocephalic.  ?Eyes:  ?   General: No scleral icterus.    ?   Right eye: No discharge.     ?   Left eye: No discharge.  ?Cardiovascular:  ?   Rate and Rhythm: Normal rate.  ?Pulmonary:  ?    Effort: Pulmonary effort is normal. No tachypnea, bradypnea or respiratory distress.  ?   Breath sounds: Normal breath sounds. No stridor.  ?Abdominal:  ?   General: Abdomen is protuberant. There is no distension. There are no signs of injury.  ?   Palpations: Abdomen is soft. There is no mass or pulsatile mass.  ?   Tenderness: There is no abdominal tenderness. There is no right CVA tenderness, left CVA tenderness, guarding or rebound.  ?   Hernia: There is no hernia in the umbilical area or ventral area.  ?Skin: ?   General: Skin is warm and dry.  ?Neurological:  ?   General: No focal deficit present.  ?   Mental Status: He is alert.  ?Psychiatric:     ?   Behavior: Behavior is cooperative.  ? ? ?ED Results / Procedures / Treatments   ?Labs ?(all labs ordered are listed, but only abnormal results are displayed) ?Labs Reviewed  ?CBC WITH DIFFERENTIAL/PLATELET - Abnormal; Notable for the following components:  ?    Result Value  ? WBC 11.9 (*)   ? Hemoglobin 17.3 (*)   ? HCT 52.6 (*)   ? Neutro Abs 8.6 (*)   ? All other components within normal limits  ?COMPREHENSIVE METABOLIC PANEL - Abnormal; Notable for the following components:  ? CO2 19 (*)   ? Glucose, Bld 103 (*)   ? Total Bilirubin 1.3 (*)   ? All other components within normal limits  ?URINALYSIS, ROUTINE W REFLEX MICROSCOPIC - Abnormal; Notable for the following components:  ? Hgb urine dipstick TRACE (*)   ? Ketones, ur 15 (*)   ? Protein, ur TRACE (*)   ? Bacteria, UA RARE (*)   ? All other components within normal limits  ?LIPASE, BLOOD  ? ? ?EKG ?EKG Interpretation ? ?Date/Time:  Sunday January 15 2022 18:48:20 EDT ?Ventricular Rate:  89 ?PR Interval:    ?QRS Duration: 95 ?QT Interval:  372 ?QTC Calculation: 453 ?R Axis:   35 ?Text Interpretation: Atrial fibrillation Low voltage, extremity and precordial leads Abnormal R-wave progression, early transition Confirmed by Madalyn Rob [phone]) on 01/15/2022 7:51:25 PM ? ?Radiology ?No results  found. ? ?Procedures ?Procedures  ? ? ?Medications Ordered in ED ?Medications  ?lactated ringers bolus 1,000 mL (0 mLs Intravenous Stopped 01/15/22 1927)  ?ondansetron (ZOFRAN) injection 4 mg (4 mg Intravenous Given 01/15/22 1730)  ? ? ?ED Course/ Medical Decision Making/ A&P ?Clinical Course as of 01/15/22 2231  ?Sun Jan 15, 2022  ?2028 I spoke to Dr.  Rush Landmark with [facility] gastroenterology.  He advised that patient can be moved to the other hospital and they will see him tomorrow.  He requested that he be direct messaged when facility is known. ? [PB]  ?  ?Clinical Course User Index ?[PB] Loni Beckwith, PA-C  ? ?                        ?  Medical Decision Making ?Amount and/or Complexity of Data Reviewed ?Labs: ordered. ? ?Risk ?Prescription drug management. ?Decision regarding hospitalization. ? ? ?Alert 88 year old male no acute distress, nontoxic-appearing.  Presents the emergency department with a chief complaint of nausea, vomiting, and diarrhea. ? ?Information is obtained from patient and patient's family members at bedside.  Patient has past medical history as outlined in HPI which complicates his care.  Past medical records reviewed including previous prior notes, labs, and imaging ? ?On exam patient's abdomen soft, nondistended, nontender with no guarding or rebound tenderness.  Will obtain CMP, CBC, lipase, UA and plan to p.o. challenge after he receives 1 L fluid bolus and Zofran. ? ?I personally viewed interpret patient's lab results.  Pertinent findings include: ?-Leukocytosis 11.9, concentration with hemoglobin at 17.3 and hematocrit of 52.6 ?-Bicarb slightly decreased at 19, total bili 1.3 ?-UA shows bacteria rare, WBC 6-10, leukocyte negative, nitrite negative. ? ?Attempted p.o. challenge RN reports that patient is not vomiting however he appears to be getting choked up on fluids and spitting them back out.   ? ?After further discussion with patient patient reports that he had steak and sticky  rice which preceded his symptoms of nausea and vomiting.  Per chart review patient had EGD 09/2020 which showed narrowing Schatzki's ring.  Concern for possible esophageal stricture versus fluid bolus.  We will consult

## 2022-01-15 NOTE — Telephone Encounter (Addendum)
Received call from [facility] emergency department. ?Patient having symptoms of dysphagia for a few weeks but this has progressed since last Thursday after eating sticky rice.  He is able to speak in full sentences and tolerate his secretions as long as he is not in taking any further fluids.  He was given a p.o. challenge retched/vomited that up.  Based on the patient's clinical stability, he does need a chest x-ray, and IV fluids.  If glucagon challenge has not been given it should be given. ?He will need to be transferred to [facility] or [facility] for further evaluation/admission observation. ?Based on ability to tolerate secretions, an upper endoscopy will be considered tomorrow (in the morning versus early afternoon pending availability due to his multiple medical comorbidities for need of anesthesia).  If the patient were to begin experiencing significant issues where he could not tolerate his secretions, then we will be available to consider an earlier endoscopy although his symptoms have been progressive and more significantly over the last few days based on the emergency department discussion. ?No need for office staff to reach out at this point. ?Placing the inpatient GI team on tomorrow so that they are aware of this potential transfer to [facility] or [facility] (they also have been sent a secure chat message). ? ? ?Justice Britain, MD ?[facility] ?Advanced Endoscopy ?Office # [phone] ?

## 2022-01-16 ENCOUNTER — Observation Stay (HOSPITAL_COMMUNITY): Payer: Medicare Other

## 2022-01-16 DIAGNOSIS — K2980 Duodenitis without bleeding: Secondary | ICD-10-CM | POA: Diagnosis present

## 2022-01-16 DIAGNOSIS — R1319 Other dysphagia: Secondary | ICD-10-CM

## 2022-01-16 DIAGNOSIS — K2950 Unspecified chronic gastritis without bleeding: Secondary | ICD-10-CM | POA: Diagnosis not present

## 2022-01-16 DIAGNOSIS — K222 Esophageal obstruction: Secondary | ICD-10-CM | POA: Diagnosis present

## 2022-01-16 DIAGNOSIS — Z833 Family history of diabetes mellitus: Secondary | ICD-10-CM | POA: Diagnosis not present

## 2022-01-16 DIAGNOSIS — I482 Chronic atrial fibrillation, unspecified: Secondary | ICD-10-CM

## 2022-01-16 DIAGNOSIS — R609 Edema, unspecified: Secondary | ICD-10-CM

## 2022-01-16 DIAGNOSIS — K2289 Other specified disease of esophagus: Secondary | ICD-10-CM | POA: Diagnosis present

## 2022-01-16 DIAGNOSIS — K3189 Other diseases of stomach and duodenum: Secondary | ICD-10-CM | POA: Diagnosis not present

## 2022-01-16 DIAGNOSIS — R5381 Other malaise: Secondary | ICD-10-CM

## 2022-01-16 DIAGNOSIS — T18128A Food in esophagus causing other injury, initial encounter: Secondary | ICD-10-CM | POA: Diagnosis present

## 2022-01-16 DIAGNOSIS — K449 Diaphragmatic hernia without obstruction or gangrene: Secondary | ICD-10-CM | POA: Diagnosis present

## 2022-01-16 DIAGNOSIS — I10 Essential (primary) hypertension: Secondary | ICD-10-CM | POA: Diagnosis present

## 2022-01-16 DIAGNOSIS — R1310 Dysphagia, unspecified: Secondary | ICD-10-CM | POA: Diagnosis not present

## 2022-01-16 DIAGNOSIS — K279 Peptic ulcer, site unspecified, unspecified as acute or chronic, without hemorrhage or perforation: Secondary | ICD-10-CM

## 2022-01-16 DIAGNOSIS — Z888 Allergy status to other drugs, medicaments and biological substances status: Secondary | ICD-10-CM | POA: Diagnosis not present

## 2022-01-16 DIAGNOSIS — R112 Nausea with vomiting, unspecified: Secondary | ICD-10-CM | POA: Diagnosis present

## 2022-01-16 DIAGNOSIS — K2990 Gastroduodenitis, unspecified, without bleeding: Secondary | ICD-10-CM | POA: Diagnosis not present

## 2022-01-16 DIAGNOSIS — R1314 Dysphagia, pharyngoesophageal phase: Secondary | ICD-10-CM | POA: Diagnosis present

## 2022-01-16 DIAGNOSIS — K209 Esophagitis, unspecified without bleeding: Secondary | ICD-10-CM | POA: Diagnosis not present

## 2022-01-16 DIAGNOSIS — K2100 Gastro-esophageal reflux disease with esophagitis, without bleeding: Secondary | ICD-10-CM | POA: Diagnosis present

## 2022-01-16 DIAGNOSIS — Z8042 Family history of malignant neoplasm of prostate: Secondary | ICD-10-CM | POA: Diagnosis not present

## 2022-01-16 DIAGNOSIS — E860 Dehydration: Secondary | ICD-10-CM | POA: Diagnosis present

## 2022-01-16 DIAGNOSIS — I517 Cardiomegaly: Secondary | ICD-10-CM | POA: Diagnosis not present

## 2022-01-16 DIAGNOSIS — D509 Iron deficiency anemia, unspecified: Secondary | ICD-10-CM | POA: Diagnosis present

## 2022-01-16 DIAGNOSIS — R600 Localized edema: Secondary | ICD-10-CM | POA: Diagnosis present

## 2022-01-16 DIAGNOSIS — I4891 Unspecified atrial fibrillation: Secondary | ICD-10-CM | POA: Diagnosis not present

## 2022-01-16 DIAGNOSIS — Z8249 Family history of ischemic heart disease and other diseases of the circulatory system: Secondary | ICD-10-CM | POA: Diagnosis not present

## 2022-01-16 DIAGNOSIS — Z885 Allergy status to narcotic agent status: Secondary | ICD-10-CM | POA: Diagnosis not present

## 2022-01-16 DIAGNOSIS — E669 Obesity, unspecified: Secondary | ICD-10-CM | POA: Diagnosis present

## 2022-01-16 DIAGNOSIS — K224 Dyskinesia of esophagus: Secondary | ICD-10-CM | POA: Diagnosis present

## 2022-01-16 DIAGNOSIS — Z823 Family history of stroke: Secondary | ICD-10-CM | POA: Diagnosis not present

## 2022-01-16 DIAGNOSIS — Q399 Congenital malformation of esophagus, unspecified: Secondary | ICD-10-CM | POA: Diagnosis not present

## 2022-01-16 DIAGNOSIS — K2970 Gastritis, unspecified, without bleeding: Secondary | ICD-10-CM | POA: Diagnosis present

## 2022-01-16 DIAGNOSIS — Z800 Family history of malignant neoplasm of digestive organs: Secondary | ICD-10-CM | POA: Diagnosis not present

## 2022-01-16 DIAGNOSIS — E785 Hyperlipidemia, unspecified: Secondary | ICD-10-CM | POA: Diagnosis present

## 2022-01-16 DIAGNOSIS — Z01818 Encounter for other preprocedural examination: Secondary | ICD-10-CM | POA: Diagnosis not present

## 2022-01-16 LAB — COMPREHENSIVE METABOLIC PANEL WITH GFR
ALT: 18 U/L (ref 0–44)
AST: 22 U/L (ref 15–41)
Albumin: 3.3 g/dL — ABNORMAL LOW (ref 3.5–5.0)
Alkaline Phosphatase: 63 U/L (ref 38–126)
Anion gap: 9 (ref 5–15)
BUN: 20 mg/dL (ref 8–23)
CO2: 19 mmol/L — ABNORMAL LOW (ref 22–32)
Calcium: 8.5 mg/dL — ABNORMAL LOW (ref 8.9–10.3)
Chloride: 111 mmol/L (ref 98–111)
Creatinine, Ser: 0.9 mg/dL (ref 0.61–1.24)
GFR, Estimated: >60 mL/min
Glucose, Bld: 77 mg/dL (ref 70–99)
Potassium: 3.8 mmol/L (ref 3.5–5.1)
Sodium: 139 mmol/L (ref 135–145)
Total Bilirubin: 1.4 mg/dL — ABNORMAL HIGH (ref 0.3–1.2)
Total Protein: 5.7 g/dL — ABNORMAL LOW (ref 6.5–8.1)

## 2022-01-16 LAB — CBC
HCT: 46.1 % (ref 39.0–52.0)
Hemoglobin: 15.2 g/dL (ref 13.0–17.0)
MCH: 33 pg (ref 26.0–34.0)
MCHC: 33 g/dL (ref 30.0–36.0)
MCV: 100.2 fL — ABNORMAL HIGH (ref 80.0–100.0)
Platelets: 156 10*3/uL (ref 150–400)
RBC: 4.6 MIL/uL (ref 4.22–5.81)
RDW: 13.1 % (ref 11.5–15.5)
WBC: 8.6 10*3/uL (ref 4.0–10.5)
nRBC: 0 % (ref 0.0–0.2)

## 2022-01-16 LAB — PROTIME-INR
INR: 1.1 (ref 0.8–1.2)
Prothrombin Time: 14.2 seconds (ref 11.4–15.2)

## 2022-01-16 LAB — GLUCOSE, CAPILLARY: Glucose-Capillary: 78 mg/dL (ref 70–99)

## 2022-01-16 MED ORDER — SODIUM CHLORIDE 0.9 % IV SOLN: INTRAVENOUS | Status: DC

## 2022-01-16 MED ORDER — SODIUM CHLORIDE 0.9 % IV SOLN
40.0000 mg | Freq: Two times a day (BID) | INTRAVENOUS | Status: DC
  Filled 2022-01-16: qty 4

## 2022-01-16 MED ORDER — PANTOPRAZOLE 2 MG/ML SUSPENSION
40.0000 mg | Freq: Two times a day (BID) | ORAL | Status: DC
  Filled 2022-01-16 (×4): qty 20

## 2022-01-16 MED ORDER — METOPROLOL TARTRATE 50 MG PO TABS
100.0000 mg | ORAL_TABLET | Freq: Two times a day (BID) | ORAL | Status: DC
  Administered 2022-01-16: 100 mg via ORAL
  Filled 2022-01-16: qty 2

## 2022-01-16 MED ORDER — OMEPRAZOLE 2 MG/ML ORAL SUSPENSION
40.0000 mg | Freq: Two times a day (BID) | ORAL | Status: DC
  Filled 2022-01-16: qty 20

## 2022-01-16 NOTE — Telephone Encounter (Signed)
Noted. Thanks.

## 2022-01-16 NOTE — Telephone Encounter (Signed)
Pt currently admitted and IP care is on going at this time. Refer to admission below: ? ? Unit: [facility] E[facility] ONCOLOGY  ? Bed: 1612 / 1612-01  ? ?No current action required at this time in terms of f/u. Will await further instructions from provider upon pt discharge. ?

## 2022-01-16 NOTE — Progress Notes (Signed)
?PROGRESS NOTE ? ?Glen [name] QBH:[id number] DOB: [birth year]  ? ?PCP: [name], MD ? ?Patient is from: Home.  Lives with his wife.  Uses rolling walker at baseline. ? ?DOA: 01/15/2022 LOS: 0 ? ?Chief complaints ?Chief Complaint  ?Patient presents with  ? Emesis  ?  ? ?Brief Narrative / Interim history: ?88 year old M with PMH of A-fib not on anticoagulation due to GIB/PUD/IDA, or esophageal dysmotility, HTN and diverticulosis presenting with dysphagia for a week that has acutely gotten worse over the last 3 days with associated nausea and vomiting, and admitted for dysphagia, intractable nausea and vomiting and dehydration.  GI consulted and planning EGD.  ? ?Subjective: ?Seen and examined earlier this morning.  No major events overnight of this morning.  He continues to endorse dysphagia with food getting stuck in his lower chest.  He denies chest pain.  He has dyspnea on exertion at baseline.  Denies nausea.  Denies UTI symptoms. ? ?Objective: ?Vitals:  ? 01/16/22 0214 01/16/22 0500 01/16/22 0635 01/16/22 1321  ?BP: 95/63  110/72 115/77  ?Pulse: 99  92 (!) 104  ?Resp: '16  16 16  '$ ?Temp: 97.7 ?F (36.5 ?C)  (!) 97.5 ?F (36.4 ?C) 97.8 ?F (36.6 ?C)  ?TempSrc: Oral  Oral Oral  ?SpO2: 98%  96% 98%  ?Weight:  98.1 kg    ?Height:      ? ? ?Examination: ? ?GENERAL: No apparent distress.  Nontoxic. ?HEENT: MMM.  Vision and hearing grossly intact.  ?NECK: Supple.  No apparent JVD.  ?RESP:  No IWOB.  Fair aeration bilaterally. ?CVS: Irregular rhythm.  Normal rate.  Heart sounds normal.  ?ABD/GI/GU: BS+. Abd soft, NTND.  ?MSK/EXT:  Moves extremities. No apparent deformity.  1+ BLE edema, right> left (chronic per patient). ?SKIN: no apparent skin lesion or wound ?NEURO: Awake, alert and oriented appropriately.  No apparent focal neuro deficit. ?PSYCH: Calm. Normal affect.  ? ?Procedures:  ?None ? ?Microbiology summarized: ?Urine culture pending. ? ?Assessment and Plan: ?* Esophageal dysphagia ?Patient with history of  Schatzki ring, tortuous esophagus and dysmotility.  Comes in with progressive dysphagia, and vomiting/regurgitation. ?-GI following-planning EGD on 4/18. ?-Meanwhile, clear liquid diet and n.p.o. after midnight ?-On omeprazole due to history of hallucination with Protonix ?-Aspiration precaution. ? ?Intractable nausea and vomiting ?Clear liquid diet and antiemetics as needed ?Decrease IV fluid to 75 cc an hour ?Continue holding home Lasix. ? ?Atrial fibrillation, chronic ([place]) ?In A-fib but rate controlled. ?-Resume home metoprolol. ?-Not on anticoagulation due to history of GI bleed ? ?Dehydration ?IV fluid as above. ? ?Physical deconditioning ?Uses walker at baseline. ?-PT/OT ? ?Peptic ulcer disease ?PPI as above. ? ?Peripheral edema ?He has 1+ BLE edema, right> left.  Chronic per patient. ?-Decrease IV fluid. ? ?Essential hypertension ?Normotensive. ?-Resume home metoprolol. ?-Continue holding Lasix. ? ? ?DVT prophylaxis:  ?SCDs Start: 01/15/22 2344 ? ?Code Status: Full code ?Family Communication: Patient and/or RN. Available if any question.  ?Level of care: Telemetry ?Status is: Observation ?The patient will require care spanning > 2 midnights and should be moved to inpatient because: Esophageal dysphagia, intractable nausea and vomiting requiring IV fluid ? ? ?Final disposition: Likely home once medically clear. ? ?Consultants:  ?Gastroenterology ? ?Sch Meds:  ?Scheduled Meds: ? metoprolol tartrate  100 mg Oral BID  ? pantoprazole sodium  40 mg Oral BID AC  ? ?Continuous Infusions: ? lactated ringers 100 mL/hr at 01/16/22 1107  ? ?PRN Meds:.levalbuterol, metoprolol tartrate, ondansetron **OR** ondansetron (ZOFRAN) IV ? ?  Antimicrobials: ?Anti-infectives (From admission, onward)  ? ? None  ? ?  ? ? ? ?I have personally reviewed the following labs and images: ?CBC: ?Recent Labs  ?Lab 01/15/22 ?1658 01/16/22 ?0604  ?WBC 11.9* 8.6  ?NEUTROABS 8.6*  --   ?HGB 17.3* 15.2  ?HCT 52.6* 46.1  ?MCV 97.2 100.2*  ?PLT 213  156  ? ?BMP &GFR ?Recent Labs  ?Lab 01/15/22 ?1658 01/16/22 ?0604  ?NA 139 139  ?K 4.0 3.8  ?CL 105 111  ?CO2 19* 19*  ?GLUCOSE 103* 77  ?BUN 22 20  ?CREATININE 1.08 0.90  ?CALCIUM 9.8 8.5*  ? ?Estimated Creatinine Clearance: 64.9 mL/min (by C-G formula based on SCr of 0.9 mg/dL). ?Liver & Pancreas: ?Recent Labs  ?Lab 01/15/22 ?1658 01/16/22 ?0604  ?AST 23 22  ?ALT 21 18  ?ALKPHOS 77 63  ?BILITOT 1.3* 1.4*  ?PROT 7.4 5.7*  ?ALBUMIN 4.6 3.3*  ? ?Recent Labs  ?Lab 01/15/22 ?1658  ?LIPASE 18  ? ?No results for input(s): AMMONIA in the last 168 hours. ?Diabetic: ?No results for input(s): HGBA1C in the last 72 hours. ?Recent Labs  ?Lab 01/16/22 ?7062  ?GLUCAP 78  ? ?Cardiac Enzymes: ?No results for input(s): CKTOTAL, CKMB, CKMBINDEX, TROPONINI in the last 168 hours. ?No results for input(s): PROBNP in the last 8760 hours. ?Coagulation Profile: ?Recent Labs  ?Lab 01/16/22 ?0604  ?INR 1.1  ? ?Thyroid Function Tests: ?No results for input(s): TSH, T4TOTAL, FREET4, T3FREE, THYROIDAB in the last 72 hours. ?Lipid Profile: ?No results for input(s): CHOL, HDL, LDLCALC, TRIG, CHOLHDL, LDLDIRECT in the last 72 hours. ?Anemia Panel: ?No results for input(s): VITAMINB12, FOLATE, FERRITIN, TIBC, IRON, RETICCTPCT in the last 72 hours. ?Urine analysis: ?   ?Component Value Date/Time  ? [place] YELLOW 01/15/2022 1927  ? APPEARANCEUR CLEAR 01/15/2022 1927  ? LABSPEC 1.024 01/15/2022 1927  ? PHURINE 5.0 01/15/2022 1927  ? GLUCOSEU NEGATIVE 01/15/2022 1927  ? HGBUR TRACE (A) 01/15/2022 1927  ? [place] NEGATIVE 01/15/2022 1927  ? KETONESUR 15 (A) 01/15/2022 1927  ? PROTEINUR TRACE (A) 01/15/2022 1927  ? UROBILINOGEN 0.2 05/27/2013 0029  ? NITRITE NEGATIVE 01/15/2022 1927  ? LEUKOCYTESUR NEGATIVE 01/15/2022 1927  ? ?Sepsis Labs: ?Invalid input(s): PROCALCITONIN, LACTICIDVEN ? ?Microbiology: ?No results found for this or any previous visit (from the past 240 hour(s)). ? ?Radiology Studies: ?X-ray chest PA and lateral ? ?Result Date:  01/16/2022 ?CLINICAL DATA:  Preop procedure.  EGD later today. EXAM: CHEST - 2 VIEW COMPARISON:  Chest x-ray dated 07/12/2020. FINDINGS: Borderline cardiomegaly, stable. Mild chronic scarring/atelectasis at the LEFT lung base. Lungs otherwise clear. No pleural effusion or pneumothorax. Chronic asymmetry of the RIGHT upper mediastinum suggesting chronic thyromegaly. IMPRESSION: 1. No active cardiopulmonary disease. No evidence of pneumonia or pulmonary edema. 2. Stable borderline cardiomegaly. Electronically Signed   By: Franki Cabot M.D.   On: 01/16/2022 09:05   ? ? ? ?Crissie Aloi T. [place] Lapaglia ?[facility] ? ?If 7PM-7AM, please contact night-coverage ?[email or web address] ?01/16/2022, 3:26 PM   ?

## 2022-01-16 NOTE — Assessment & Plan Note (Addendum)
He has 1+ BLE edema, right> left.  Chronic per patient ?-Continue home Lasix ?

## 2022-01-16 NOTE — Assessment & Plan Note (Addendum)
In A-fib but rate controlled. ?Continue home meds. ?

## 2022-01-16 NOTE — H&P (View-Only) (Signed)
? ? ? Consultation ? ?Referring Provider: Dr. Cyndia Skeeters    ?Primary Care Physician:  [name], MD ?Primary Gastroenterologist: Dr. Hilarie Fredrickson        ?Reason for Consultation: Dysphagia    ?       ? HPI:   ?Glen [name] is a 88 y.o. male with a past medical history significant for chronic A-fib not on anticoagulant due to GI bleed history with PUD, hypertension, hyperlipidemia, diverticulosis, IDA, esophageal dysmotility, who presents to the ED on 01/15/2022 with dysphagia ongoing for weeks but worse over the past 3 days after eating "sticky rice and steak". ?   Today, the patient is seen with his family members by his bedside who assists with his history.  Together they all explain that he has had constant trouble with swallowing off and on but over the past 5 days this has acutely gotten worse.  Patient tells me that his son brought him over some steak and he ate some sticky rice and after eating that he had trouble even getting liquids down which seemed to just go down barely and come right back out.  Since that time he has now been able to get down some "pills", and a small sip of water usually, other than an incident yesterday when one of his Lasix pills came back up.  He has really not had anything else to eat or mass quantities of liquid, he has just been taking sips here and there.  He is able to handle his secretions.  Denies any abdominal or throat pain.  Unclear if he is still taking his PPI. ?   Also complains today of diarrhea.  Apparently previously was constipated and on MiraLAX for period of time but over the past few months he has changed to Metamucil which he is taking daily but describes some watery bowel movements.  Does tell me he would typically have a solid stool in the morning followed by 3 or 4 loose stools throughout the day but no abdominal pain.  Then about 5 days ago when he started having trouble eating he seemed to only be having loose stools telling me that they were watery and  fairly frequent.  They seem to have slowed down over the past couple days but he has not had a solid in at least 5 or 6 days. ?   Denies fever, chills, weight loss, blood in his stool, nausea, vomiting or symptoms that awaken him from sleep. ? ?ED course: WBC 11.9, hemoglobin 17.3, platelets 213, A-fib ? ?GI history: ?09/17/2020 EGD ?- Normal mucosa was found in the entire esophagus. ?- Low-grade of narrowing Schatzki ring. ?- Mild gastritis. Biopsied to exclude persistent H. Pylori infection. ?- Mild duodenitis in the bulb. Duodenal ulcer has healed. ?- Normal second portion of the duodenum. ?Plan: ?- Await pathology results. If H. Pylori is positive then will need retreatment (with Talicia). If negative consider low dose daily PPI given mild gastroduodenitis seen today (to prevent future ulceration). ?Biopsies negative for H. pylori ? ?09/14/2020 follow-up in clinic with Dr. Hilarie Fredrickson: That time discussed recent hospitalization with bleeding duodenal ulcer and related H. pylori, at that time doing well but developed a rash with ferrous sulfate so he has stopped taking it, at that time recommended repeat EGD, also discussed known dysphagia which was thought related to esophageal dysmotility/presbyesophagus, also with chronic constipation on MiraLAX ? ?07/14/2020 EGD ?- Tortuous esophagus likely in keeping with esophageal dysmotility/presbyesophagus. ?- Mild reflux esophagitis with no bleeding  and focus of nodularity at GE junction. Biopsied. ?- Gastritis with no bleeding. Biopsied. ?- Duodenal ulcer with red spot (source of recent upper GI bleeding). Clip was placed. ?- Normal second portion of the duodenum. ?Plan: ?- BID IV PPI today, then PO BID PPI starting tomorrow x 8 weeks, then daily ?thereafter. ? ?Past Medical History:  ?Diagnosis Date  ? Atrial fibrillation ([place])   ? Diverticulitis   ? Esophageal dysmotility   ? Family history of diabetes insipidus   ? FHx: colon cancer   ? H. pylori infection   ? History of  colonic polyps   ? Hyperlipidemia   ? Hypertension   ? IDA (iron deficiency anemia)   ? Neoplasm of prostate   ? special screening malignant   ? Obesity   ? Palpitation   ? Peptic ulcer disease   ? ? ?Past Surgical History:  ?Procedure Laterality Date  ? 2-D Echocardiogram  November 2009, 2011  ? Anal Fistula Repair    ? BIOPSY  07/14/2020  ? Procedure: BIOPSY;  Surgeon: [name], MD;  Location: [facility] ENDOSCOPY;  Service: Gastroenterology;;  ? [place]  ? status post balloon valvuloplasty at [facility]  ? CATARACT EXTRACTION  2004  ? COLONOSCOPY  December 2005  ? ESOPHAGOGASTRODUODENOSCOPY  07/14/2020  ? ESOPHAGOGASTRODUODENOSCOPY (EGD) WITH PROPOFOL N/A 07/14/2020  ? Procedure: ESOPHAGOGASTRODUODENOSCOPY (EGD) WITH PROPOFOL;  Surgeon: [name], MD;  Location: [facility] ENDOSCOPY;  Service: Gastroenterology;  Laterality: N/A;  ? ETT  1991  ? Negative  ? HEMOSTASIS CLIP PLACEMENT  07/14/2020  ? HEMOSTASIS CLIP PLACEMENT  ? HEMOSTASIS CLIP PLACEMENT  07/14/2020  ? Procedure: HEMOSTASIS CLIP PLACEMENT;  Surgeon: [name], MD;  Location: [facility] ENDOSCOPY;  Service: Gastroenterology;;  ? KNEE ARTHROSCOPY    ? Right  ? TONSILLECTOMY    ? ? ?Family History  ?Problem Relation Age of Onset  ? Heart attack Father   ? Colon cancer Other   ? Diabetes Other   ? Prostate cancer Brother   ? Heart disease Other   ? Coronary artery disease Other   ?     Two siblings, status post stenting to siblings with heart disease. One. Status post pacemaker insertion.  ? Stroke Brother   ? ? ?Social History  ? ?Tobacco Use  ? Smoking status: Never  ? Smokeless tobacco: Never  ?Vaping Use  ? Vaping Use: Never used  ?Substance Use Topics  ? Alcohol use: No  ? Drug use: Never  ? ? ?Prior to Admission medications   ?Medication Sig Start Date End Date Taking? Authorizing Provider  ?diphenhydramine-acetaminophen (TYLENOL PM EXTRA STRENGTH) 25-500 MG TABS tablet Take 3 mg by mouth at bedtime. 07/17/20  Yes [provider]   ?furosemide (LASIX) 20 MG tablet Take 1 tablet (20 mg total) by mouth daily. 05/06/21  Yes Troy Sine, MD  ?metoprolol tartrate (LOPRESSOR) 100 MG tablet Take 1 tablet (100 mg total) by mouth 2 (two) times daily. 08/08/21  Yes Troy Sine, MD  ?polyethylene glycol powder (MIRALAX) 17 GM/SCOOP powder Take 17 g by mouth 2 (two) times daily as needed for moderate constipation. ?Patient not taking: Reported on 01/15/2022 07/17/20   Mercy Riding, MD  ? ? ?Current Facility-Administered Medications  ?Medication Dose Route Frequency Provider Last Rate Last Admin  ? lactated ringers infusion   Intravenous Continuous Etta Quill, DO 100 mL/hr at 01/15/22 2341 New Bag at 01/15/22 2341  ? levalbuterol (XOPENEX) nebulizer solution  0.63 mg  0.63 mg Nebulization Q6H PRN Myles Rosenthal A, MD      ? metoprolol tartrate (LOPRESSOR) injection 2.5 mg  2.5 mg Intravenous Q6H PRN Clance Boll, MD      ? ondansetron [facility]) tablet 4 mg  4 mg Oral Q6H PRN Clance Boll, MD      ? Or  ? ondansetron (ZOFRAN) injection 4 mg  4 mg Intravenous Q6H PRN Clance Boll, MD      ? ? ?Allergies as of 01/15/2022 - Review Complete 01/15/2022  ?Allergen Reaction Noted  ? Protonix [pantoprazole] Other (See Comments) 08/09/2020  ? Codeine Other (See Comments) 07/31/2007  ? Ferrous sulfate Itching and Swelling 09/14/2020  ? ? ? ?Review of Systems:    ?Constitutional: No weight loss, fever or chills ?Skin: No rash  ?Cardiovascular: No chest pain ?Respiratory: No SOB  ?Gastrointestinal: See HPI and otherwise negative ?Genitourinary: No dysuria  ?Neurological: No headache, dizziness or syncope ?Musculoskeletal: No new muscle or joint pain ?Hematologic: No bleeding  ?Psychiatric: No history of depression or anxiety  ? ? Physical Exam:  ?Vital signs in last 24 hours: ?Temp:  [97.5 ?F (36.4 ?C)-97.7 ?F (36.5 ?C)] 97.5 ?F (36.4 ?C) (04/17 5009) ?Pulse Rate:  [89-119] 92 (04/17 0635) ?Resp:  [13-20] 16 (04/17 3818) ?BP:  (95-129)/(63-90) 110/72 (04/17 2993) ?SpO2:  [96 %-100 %] 96 % (04/17 0635) ?Weight:  [97.2 kg-98.6 kg] 98.1 kg (04/17 0500) ?Last BM Date : 01/15/22 ?General:   Pleasant Elderly, Caucasian male appears to be in NAD, We

## 2022-01-16 NOTE — Assessment & Plan Note (Addendum)
Resolved

## 2022-01-16 NOTE — TOC Initial Note (Signed)
Transition of Care (TOC) - Initial/Assessment Note  ? ? ?Patient Details  ?Name: Glen [name] ?MRN: [id number] ?Date of Birth: 05/20/1928 ? ?Transition of Care (TOC) CM/SW Contact:    ?Tawanna Cooler, RN ?Phone Number: ?01/16/2022, 12:56 PM ? ?Clinical Narrative:                 ? ?Transition of Care Department [facility]) has reviewed patient and no TOC needs have been identified at this time. We will continue to monitor patient advancement through interdisciplinary progression rounds. If new patient transition needs arise, please place a TOC consult. ? ? ? ?Expected Discharge Plan: Home/Self Care ?Barriers to Discharge: Continued Medical Work up ? ? ?Expected Discharge Plan and Services ?Expected Discharge Plan: Home/Self Care ?  ?  ?  ?Living arrangements for the past 2 months: [place] ?                ?  ?Prior Living Arrangements/Services ?Living arrangements for the past 2 months: [place] ?Lives with:: Spouse ?Patient language and need for interpreter reviewed:: Yes ?       ?Need for Family Participation in Patient Care: Yes (Comment) ?Care giver support system in place?: Yes (comment) ?  ?Criminal Activity/Legal Involvement Pertinent to Current Situation/Hospitalization: No - Comment as needed ? ?Activities of Daily Living ?Home Assistive Devices/Equipment: Walker (specify type) (Rollator) ?ADL Screening (condition at time of admission) ?Patient's cognitive ability adequate to safely complete daily activities?: Yes ?Is the patient deaf or have difficulty hearing?: No ?Does the patient have difficulty seeing, even when wearing glasses/contacts?: Yes ?Does the patient have difficulty concentrating, remembering, or making decisions?: No ?Patient able to express need for assistance with ADLs?: Yes ?Does the patient have difficulty dressing or bathing?: No ?Independently performs ADLs?: Yes (appropriate for developmental age) ?Does the patient have difficulty walking or climbing stairs?:  Yes ?Weakness of Legs: None ?Weakness of Arms/Hands: None ? ?Emotional Assessment ?  ?Orientation: : Oriented to Self, Oriented to Place, Oriented to  Time, Oriented to Situation ?Alcohol / Substance Use: Not Applicable ?Psych Involvement: No (comment) ? ?Admission diagnosis:  Intractable nausea and vomiting [R11.2] ?Dysphagia [R13.10] ?Patient Active Problem List  ? Diagnosis Date Noted  ? Intractable nausea and vomiting 01/15/2022  ? Peptic ulcer disease   ? Fall 07/13/2020  ? Osteoarthritis 06/25/2019  ? Onychomycosis 07/29/2018  ? Dysphagia 07/29/2018  ? Peripheral edema 11/18/2015  ? Impaired glucose tolerance 04/19/2015  ? Essential hypertension 01/20/2009  ? Atrial fibrillation, chronic ([place]) 03/13/2008  ? Dyslipidemia 07/31/2007  ? History of colonic polyps 07/31/2007  ? ?PCP:  [name], MD ?Pharmacy:   ?[facility] ?[place] ?[place] [address] ?Phone: [phone] Fax: [phone] ? ? ?

## 2022-01-16 NOTE — Consult Note (Addendum)
? ? ? Consultation ? ?Referring Provider: Dr. Cyndia Skeeters    ?Primary Care Physician:  [name], MD ?Primary Gastroenterologist: Dr. Hilarie Fredrickson        ?Reason for Consultation: Dysphagia    ?       ? HPI:   ?Glen [name] is a 88 y.o. male with a past medical history significant for chronic A-fib not on anticoagulant due to GI bleed history with PUD, hypertension, hyperlipidemia, diverticulosis, IDA, esophageal dysmotility, who presents to the ED on 01/15/2022 with dysphagia ongoing for weeks but worse over the past 3 days after eating "sticky rice and steak". ?   Today, the patient is seen with his family members by his bedside who assists with his history.  Together they all explain that he has had constant trouble with swallowing off and on but over the past 5 days this has acutely gotten worse.  Patient tells me that his son brought him over some steak and he ate some sticky rice and after eating that he had trouble even getting liquids down which seemed to just go down barely and come right back out.  Since that time he has now been able to get down some "pills", and a small sip of water usually, other than an incident yesterday when one of his Lasix pills came back up.  He has really not had anything else to eat or mass quantities of liquid, he has just been taking sips here and there.  He is able to handle his secretions.  Denies any abdominal or throat pain.  Unclear if he is still taking his PPI. ?   Also complains today of diarrhea.  Apparently previously was constipated and on MiraLAX for period of time but over the past few months he has changed to Metamucil which he is taking daily but describes some watery bowel movements.  Does tell me he would typically have a solid stool in the morning followed by 3 or 4 loose stools throughout the day but no abdominal pain.  Then about 5 days ago when he started having trouble eating he seemed to only be having loose stools telling me that they were watery and  fairly frequent.  They seem to have slowed down over the past couple days but he has not had a solid in at least 5 or 6 days. ?   Denies fever, chills, weight loss, blood in his stool, nausea, vomiting or symptoms that awaken him from sleep. ? ?ED course: WBC 11.9, hemoglobin 17.3, platelets 213, A-fib ? ?GI history: ?09/17/2020 EGD ?- Normal mucosa was found in the entire esophagus. ?- Low-grade of narrowing Schatzki ring. ?- Mild gastritis. Biopsied to exclude persistent H. Pylori infection. ?- Mild duodenitis in the bulb. Duodenal ulcer has healed. ?- Normal second portion of the duodenum. ?Plan: ?- Await pathology results. If H. Pylori is positive then will need retreatment (with Talicia). If negative consider low dose daily PPI given mild gastroduodenitis seen today (to prevent future ulceration). ?Biopsies negative for H. pylori ? ?09/14/2020 follow-up in clinic with Dr. Hilarie Fredrickson: That time discussed recent hospitalization with bleeding duodenal ulcer and related H. pylori, at that time doing well but developed a rash with ferrous sulfate so he has stopped taking it, at that time recommended repeat EGD, also discussed known dysphagia which was thought related to esophageal dysmotility/presbyesophagus, also with chronic constipation on MiraLAX ? ?07/14/2020 EGD ?- Tortuous esophagus likely in keeping with esophageal dysmotility/presbyesophagus. ?- Mild reflux esophagitis with no bleeding  and focus of nodularity at GE junction. Biopsied. ?- Gastritis with no bleeding. Biopsied. ?- Duodenal ulcer with red spot (source of recent upper GI bleeding). Clip was placed. ?- Normal second portion of the duodenum. ?Plan: ?- BID IV PPI today, then PO BID PPI starting tomorrow x 8 weeks, then daily ?thereafter. ? ?Past Medical History:  ?Diagnosis Date  ? Atrial fibrillation ([place])   ? Diverticulitis   ? Esophageal dysmotility   ? Family history of diabetes insipidus   ? FHx: colon cancer   ? H. pylori infection   ? History of  colonic polyps   ? Hyperlipidemia   ? Hypertension   ? IDA (iron deficiency anemia)   ? Neoplasm of prostate   ? special screening malignant   ? Obesity   ? Palpitation   ? Peptic ulcer disease   ? ? ?Past Surgical History:  ?Procedure Laterality Date  ? 2-D Echocardiogram  November 2009, 2011  ? Anal Fistula Repair    ? BIOPSY  07/14/2020  ? Procedure: BIOPSY;  Surgeon: [name], MD;  Location: [facility] ENDOSCOPY;  Service: Gastroenterology;;  ? [place]  ? status post balloon valvuloplasty at [facility]  ? CATARACT EXTRACTION  2004  ? COLONOSCOPY  December 2005  ? ESOPHAGOGASTRODUODENOSCOPY  07/14/2020  ? ESOPHAGOGASTRODUODENOSCOPY (EGD) WITH PROPOFOL N/A 07/14/2020  ? Procedure: ESOPHAGOGASTRODUODENOSCOPY (EGD) WITH PROPOFOL;  Surgeon: [name], MD;  Location: [facility] ENDOSCOPY;  Service: Gastroenterology;  Laterality: N/A;  ? ETT  1991  ? Negative  ? HEMOSTASIS CLIP PLACEMENT  07/14/2020  ? HEMOSTASIS CLIP PLACEMENT  ? HEMOSTASIS CLIP PLACEMENT  07/14/2020  ? Procedure: HEMOSTASIS CLIP PLACEMENT;  Surgeon: [name], MD;  Location: [facility] ENDOSCOPY;  Service: Gastroenterology;;  ? KNEE ARTHROSCOPY    ? Right  ? TONSILLECTOMY    ? ? ?Family History  ?Problem Relation Age of Onset  ? Heart attack Father   ? Colon cancer Other   ? Diabetes Other   ? Prostate cancer Brother   ? Heart disease Other   ? Coronary artery disease Other   ?     Two siblings, status post stenting to siblings with heart disease. One. Status post pacemaker insertion.  ? Stroke Brother   ? ? ?Social History  ? ?Tobacco Use  ? Smoking status: Never  ? Smokeless tobacco: Never  ?Vaping Use  ? Vaping Use: Never used  ?Substance Use Topics  ? Alcohol use: No  ? Drug use: Never  ? ? ?Prior to Admission medications   ?Medication Sig Start Date End Date Taking? Authorizing Provider  ?diphenhydramine-acetaminophen (TYLENOL PM EXTRA STRENGTH) 25-500 MG TABS tablet Take 3 mg by mouth at bedtime. 07/17/20  Yes [provider]   ?furosemide (LASIX) 20 MG tablet Take 1 tablet (20 mg total) by mouth daily. 05/06/21  Yes Troy Sine, MD  ?metoprolol tartrate (LOPRESSOR) 100 MG tablet Take 1 tablet (100 mg total) by mouth 2 (two) times daily. 08/08/21  Yes Troy Sine, MD  ?polyethylene glycol powder (MIRALAX) 17 GM/SCOOP powder Take 17 g by mouth 2 (two) times daily as needed for moderate constipation. ?Patient not taking: Reported on 01/15/2022 07/17/20   Mercy Riding, MD  ? ? ?Current Facility-Administered Medications  ?Medication Dose Route Frequency Provider Last Rate Last Admin  ? lactated ringers infusion   Intravenous Continuous Etta Quill, DO 100 mL/hr at 01/15/22 2341 New Bag at 01/15/22 2341  ? levalbuterol (XOPENEX) nebulizer solution  0.63 mg  0.63 mg Nebulization Q6H PRN Myles Rosenthal A, MD      ? metoprolol tartrate (LOPRESSOR) injection 2.5 mg  2.5 mg Intravenous Q6H PRN Clance Boll, MD      ? ondansetron [facility]) tablet 4 mg  4 mg Oral Q6H PRN Clance Boll, MD      ? Or  ? ondansetron (ZOFRAN) injection 4 mg  4 mg Intravenous Q6H PRN Clance Boll, MD      ? ? ?Allergies as of 01/15/2022 - Review Complete 01/15/2022  ?Allergen Reaction Noted  ? Protonix [pantoprazole] Other (See Comments) 08/09/2020  ? Codeine Other (See Comments) 07/31/2007  ? Ferrous sulfate Itching and Swelling 09/14/2020  ? ? ? ?Review of Systems:    ?Constitutional: No weight loss, fever or chills ?Skin: No rash  ?Cardiovascular: No chest pain ?Respiratory: No SOB  ?Gastrointestinal: See HPI and otherwise negative ?Genitourinary: No dysuria  ?Neurological: No headache, dizziness or syncope ?Musculoskeletal: No new muscle or joint pain ?Hematologic: No bleeding  ?Psychiatric: No history of depression or anxiety  ? ? Physical Exam:  ?Vital signs in last 24 hours: ?Temp:  [97.5 ?F (36.4 ?C)-97.7 ?F (36.5 ?C)] 97.5 ?F (36.4 ?C) (04/17 8921) ?Pulse Rate:  [89-119] 92 (04/17 0635) ?Resp:  [13-20] 16 (04/17 1941) ?BP:  (95-129)/(63-90) 110/72 (04/17 7408) ?SpO2:  [96 %-100 %] 96 % (04/17 0635) ?Weight:  [97.2 kg-98.6 kg] 98.1 kg (04/17 0500) ?Last BM Date : 01/15/22 ?General:   Pleasant Elderly, Caucasian male appears to be in NAD, We

## 2022-01-16 NOTE — Assessment & Plan Note (Addendum)
Patient with history of Schatzki ring, tortuous esophagus and dysmotility.  Comes in with progressive dysphagia, and vomiting/regurgitation.  Status post EGD and dilation as above. ?

## 2022-01-16 NOTE — Assessment & Plan Note (Addendum)
Continue omeprazole.  Allergic to Protonix ?

## 2022-01-16 NOTE — Assessment & Plan Note (Addendum)
-   Continue home meds °

## 2022-01-16 NOTE — Hospital Course (Addendum)
88 year old M with PMH of A-fib not on anticoagulation due to GIB/PUD/IDA, or esophageal dysmotility, HTN and diverticulosis presenting with dysphagia for a week that has acutely gotten worse over the last 3 days with associated nausea and vomiting, and admitted for dysphagia, intractable nausea and vomiting and dehydration.  GI consulted. ? ?Patient underwent EGD that showed  ?- Presbyesophagus/motility disorder ?- Moderate Schatzki ring. Biopsied. Dilated. ?- Small hiatal hernia. ?- Gastritis. Biopsied. ?- Duodenitis. Biopsied. ?- Nonobstructing esophageal food bolus s/p endoscopic disimpaction ? ?Tolerated soft diet after procedure.  Cleared for discharge by GI on omeprazole for outpatient follow-up for in 6 to 8 weeks.  No need identified by therapy. ?

## 2022-01-16 NOTE — Assessment & Plan Note (Addendum)
Uses walker at baseline. ?No need identified by therapy. ?

## 2022-01-17 ENCOUNTER — Inpatient Hospital Stay (HOSPITAL_COMMUNITY): Payer: Medicare Other | Admitting: Anesthesiology

## 2022-01-17 ENCOUNTER — Encounter (HOSPITAL_COMMUNITY): Payer: Self-pay | Admitting: Student

## 2022-01-17 ENCOUNTER — Encounter (HOSPITAL_COMMUNITY)
Admission: EM | Disposition: A | Discharge status: Home / Self Care | Payer: Self-pay | Source: Home / Self Care | Attending: Student

## 2022-01-17 DIAGNOSIS — K2980 Duodenitis without bleeding: Secondary | ICD-10-CM

## 2022-01-17 DIAGNOSIS — I4891 Unspecified atrial fibrillation: Secondary | ICD-10-CM

## 2022-01-17 DIAGNOSIS — I482 Chronic atrial fibrillation, unspecified: Secondary | ICD-10-CM | POA: Diagnosis not present

## 2022-01-17 DIAGNOSIS — K222 Esophageal obstruction: Secondary | ICD-10-CM

## 2022-01-17 DIAGNOSIS — K449 Diaphragmatic hernia without obstruction or gangrene: Secondary | ICD-10-CM

## 2022-01-17 DIAGNOSIS — R1310 Dysphagia, unspecified: Secondary | ICD-10-CM

## 2022-01-17 DIAGNOSIS — R112 Nausea with vomiting, unspecified: Secondary | ICD-10-CM | POA: Diagnosis not present

## 2022-01-17 DIAGNOSIS — K2990 Gastroduodenitis, unspecified, without bleeding: Secondary | ICD-10-CM

## 2022-01-17 DIAGNOSIS — E860 Dehydration: Secondary | ICD-10-CM | POA: Diagnosis not present

## 2022-01-17 DIAGNOSIS — K2970 Gastritis, unspecified, without bleeding: Secondary | ICD-10-CM

## 2022-01-17 DIAGNOSIS — R1319 Other dysphagia: Secondary | ICD-10-CM | POA: Diagnosis not present

## 2022-01-17 DIAGNOSIS — Q399 Congenital malformation of esophagus, unspecified: Secondary | ICD-10-CM

## 2022-01-17 HISTORY — PX: ESOPHAGOGASTRODUODENOSCOPY: SHX5428

## 2022-01-17 HISTORY — PX: BALLOON DILATION: SHX5330

## 2022-01-17 HISTORY — PX: BIOPSY: SHX5522

## 2022-01-17 LAB — COMPREHENSIVE METABOLIC PANEL
ALT: 18 U/L (ref 0–44)
AST: 23 U/L (ref 15–41)
Albumin: 3.2 g/dL — ABNORMAL LOW (ref 3.5–5.0)
Alkaline Phosphatase: 65 U/L (ref 38–126)
Anion gap: 5 (ref 5–15)
BUN: 15 mg/dL (ref 8–23)
CO2: 24 mmol/L (ref 22–32)
Calcium: 8.5 mg/dL — ABNORMAL LOW (ref 8.9–10.3)
Chloride: 110 mmol/L (ref 98–111)
Creatinine, Ser: 0.85 mg/dL (ref 0.61–1.24)
GFR, Estimated: >60 mL/min (ref >60)
Glucose, Bld: 94 mg/dL (ref 70–99)
Potassium: 3.7 mmol/L (ref 3.5–5.1)
Sodium: 139 mmol/L (ref 135–145)
Total Bilirubin: 1.5 mg/dL — ABNORMAL HIGH (ref 0.3–1.2)
Total Protein: 5.7 g/dL — ABNORMAL LOW (ref 6.5–8.1)

## 2022-01-17 LAB — URINE CULTURE

## 2022-01-17 LAB — CBC
HCT: 44.5 % (ref 39.0–52.0)
Hemoglobin: 15.4 g/dL (ref 13.0–17.0)
MCH: 33.8 pg (ref 26.0–34.0)
MCHC: 34.6 g/dL (ref 30.0–36.0)
MCV: 97.6 fL (ref 80.0–100.0)
Platelets: 169 10*3/uL (ref 150–400)
RBC: 4.56 MIL/uL (ref 4.22–5.81)
RDW: 12.9 % (ref 11.5–15.5)
WBC: 6.9 10*3/uL (ref 4.0–10.5)
nRBC: 0 % (ref 0.0–0.2)

## 2022-01-17 LAB — GLUCOSE, CAPILLARY
Glucose-Capillary: 105 mg/dL — ABNORMAL HIGH (ref 70–99)
Glucose-Capillary: 141 mg/dL — ABNORMAL HIGH (ref 70–99)
Glucose-Capillary: 66 mg/dL — ABNORMAL LOW (ref 70–99)
Glucose-Capillary: 66 mg/dL — ABNORMAL LOW (ref 70–99)
Glucose-Capillary: 76 mg/dL (ref 70–99)
Glucose-Capillary: 91 mg/dL (ref 70–99)
Glucose-Capillary: 92 mg/dL (ref 70–99)

## 2022-01-17 LAB — PHOSPHORUS: Phosphorus: 2.6 mg/dL (ref 2.5–4.6)

## 2022-01-17 LAB — MAGNESIUM: Magnesium: 1.9 mg/dL (ref 1.7–2.4)

## 2022-01-17 SURGERY — EGD (ESOPHAGOGASTRODUODENOSCOPY)
Anesthesia: Monitor Anesthesia Care

## 2022-01-17 MED ORDER — PROPOFOL 10 MG/ML IV BOLUS
INTRAVENOUS | Status: DC | PRN
  Administered 2022-01-17: 120 ug/kg/min via INTRAVENOUS

## 2022-01-17 MED ORDER — FUROSEMIDE 20 MG PO TABS: 20.0000 mg | ORAL_TABLET | Freq: Every day | ORAL | 3 refills | Status: DC

## 2022-01-17 MED ORDER — LACTATED RINGERS IV SOLN: INTRAVENOUS | Status: DC

## 2022-01-17 MED ORDER — DEXTROSE 50 % IV SOLN
12.5000 g | INTRAVENOUS | Status: AC
  Administered 2022-01-17: 12.5 g via INTRAVENOUS
  Filled 2022-01-17: qty 50

## 2022-01-17 MED ORDER — DEXTROSE IN LACTATED RINGERS 5 % IV SOLN: INTRAVENOUS | Status: DC

## 2022-01-17 MED ORDER — DEXTROSE 50 % IV SOLN
25.0000 mL | Freq: Once | INTRAVENOUS | Status: AC
  Administered 2022-01-17: 25 mL via INTRAVENOUS
  Filled 2022-01-17: qty 50

## 2022-01-17 MED ORDER — PHENYLEPHRINE HCL (PRESSORS) 10 MG/ML IV SOLN
INTRAVENOUS | Status: DC | PRN
  Administered 2022-01-17 (×3): 160 ug via INTRAVENOUS

## 2022-01-17 MED ORDER — LIDOCAINE HCL (CARDIAC) PF 100 MG/5ML IV SOSY
PREFILLED_SYRINGE | INTRAVENOUS | Status: DC | PRN
  Administered 2022-01-17: 100 mg via INTRAVENOUS

## 2022-01-17 MED ORDER — DEXTROSE 50 % IV SOLN: 12.5000 g | INTRAVENOUS | Status: AC

## 2022-01-17 MED ORDER — OMEPRAZOLE 40 MG PO CPDR
40.0000 mg | DELAYED_RELEASE_CAPSULE | Freq: Every day | ORAL | 1 refills | Status: DC
Start: 2022-01-17 — End: 2022-02-13

## 2022-01-17 NOTE — Transfer of Care (Deleted)
Immediate Anesthesia Transfer of Care Note ? ?Patient: Glen [name] ? ?Procedure(s) Performed: ESOPHAGOGASTRODUODENOSCOPY (EGD) ?BIOPSY ?BALLOON DILATION ? ?Patient Location: PACU ? ?Anesthesia Type:MAC ? ?Level of Consciousness: sedated, patient cooperative and responds to stimulation ? ?Airway & Oxygen Therapy: Patient Spontanous Breathing and Patient connected to face mask oxygen ? ?Post-op Assessment: Report given to RN and MDA notified of hypotension, phenylephrine bolus given ? ?Post vital signs: Reviewed ? ?Last Vitals:  ?Vitals Value Taken Time  ?BP 74/36 01/17/22 1403  ?Temp    ?Pulse 94 01/17/22 1403  ?Resp 17 01/17/22 1403  ?SpO2 100 % 01/17/22 1403  ? ? ?Last Pain:  ?Vitals:  ? 01/17/22 1403  ?TempSrc:   ?PainSc: Asleep  ?   ? ?  ? ?Complications: No notable events documented. ?

## 2022-01-17 NOTE — Evaluation (Signed)
Occupational Therapy Evaluation ?Patient Details ?Name: Glen [name] ?MRN: [id number] ?DOB: Jan 06, 1928 ?Today's Date: 01/17/2022 ? ? ?History of Present Illness Patient is a 88 year old male who presented with nausea and vomitting with dysphagia. patient was admitted with dysphagia and dehydration. patient underwent EGD this AM.PMH: HTN, diverticulosis, A fib, schatzki ring  ? ?Clinical Impression ?  ?Patient is a 88 year old male who was admitted for above. Patient was noted to appear to be close to baseline in ADL tasks at rollator level. Patient's son in room during session. Patient was noted to have HR increase to 137 bpm with functional activity with increased time and returning to bed to reduce HR. Patient was noted to be talkative and easily excitable with impulsive movements. Patient was SUP for ADL tasks at rollator level with poor safety awareness with rollator breaks.  Patient plans to transition home with family support. Patient would continue to benefit from skilled OT services at this time while admitted to address noted deficits in order to improve overall safety and independence in ADLs.  ? ?   ? ?Recommendations for follow up therapy are one component of a multi-disciplinary discharge planning process, led by the attending physician.  Recommendations may be updated based on patient status, additional functional criteria and insurance authorization.  ? ?Follow Up Recommendations ? No OT follow up  ?  ?Assistance Recommended at Discharge Frequent or constant Supervision/Assistance  ?Patient can return home with the following A little help with walking and/or transfers;A lot of help with bathing/dressing/bathroom;Assistance with cooking/housework;Direct supervision/assist for medications management;Help with stairs or ramp for entrance;Direct supervision/assist for financial management;A little help with bathing/dressing/bathroom;Assist for transportation ? ?  ?Functional Status Assessment ? Patient  has had a recent decline in their functional status and demonstrates the ability to make significant improvements in function in a reasonable and predictable amount of time.  ?Equipment Recommendations ? None recommended by OT  ?  ?Recommendations for Other Services   ? ? ?  ?Precautions / Restrictions Precautions ?Precaution Comments: monitor HR ?Restrictions ?Weight Bearing Restrictions: No  ? ?  ? ?Mobility Bed Mobility ?Overal bed mobility: Needs Assistance ?Bed Mobility: Supine to Sit ?  ?  ?Supine to sit: Supervision ?  ?  ?General bed mobility comments: to slow down with movements ?  ? ?Transfers ?  ?  ?  ?  ?  ?  ?  ?  ?  ?  ?  ? ?  ?Balance Overall balance assessment: Needs assistance ?Sitting-balance support: No upper extremity supported, Feet supported ?Sitting balance-Leahy Scale: Good ?  ?  ?Standing balance support: During functional activity ?Standing balance-Leahy Scale: Fair ?  ?  ?  ?  ?  ?  ?  ?  ?  ?  ?  ?  ?   ? ?ADL either performed or assessed with clinical judgement  ? ?ADL Overall ADL's : At baseline ?  ?  ?  ?  ?  ?  ?  ?  ?  ?  ?  ?  ?  ?  ?  ?  ?  ?  ?  ?General ADL Comments: patient was SUP for ADL tasks at this time with patient able to don/doff socks sitting on edge of bed with MI,patient was SUP standing to complete cothign management of pulling pants up. patient completed functional mobility in hallway with HR noted to increase to 137 bpm with increased time to come back down with rest. patient has poor  safety awareness with breaks on rollator with education provided on safety. patient verbalized understanding but continued to need cues to implement during activity. patient appears at baseline for ADls at this time with son present endorsing baseline.  ? ? ? ?Vision Baseline Vision/History: 1 Wears glasses ?Patient Visual Report: No change from baseline ?   ?   ?Perception   ?  ?Praxis   ?  ? ?Pertinent Vitals/Pain Pain Assessment ?Pain Assessment: Faces ?Faces Pain Scale: Hurts a  little bit ?Pain Location: discomfort across thoracic area of back ?Pain Descriptors / Indicators: Discomfort ?Pain Intervention(s): Monitored during session  ? ? ? ?Hand Dominance Right ?  ?Extremity/Trunk Assessment Upper Extremity Assessment ?Upper Extremity Assessment: Overall WFL for tasks assessed ?  ?Lower Extremity Assessment ?Lower Extremity Assessment: Defer to PT evaluation ?  ?Cervical / Trunk Assessment ?Cervical / Trunk Assessment: Kyphotic ?  ?Communication Communication ?Communication: HOH ?  ?Cognition Arousal/Alertness: Awake/alert ?Behavior During Therapy: Impulsive ?Overall Cognitive Status: Difficult to assess ?  ?  ?  ?  ?  ?  ?  ?  ?  ?  ?  ?  ?  ?  ?  ?  ?General Comments: patient was very talkative during session with increased impulsive movements. patients son indicated that this was near baseline for patient. patient was noted to have poor safety awareness with rollator breaks ?  ?  ?General Comments    ? ?  ?Exercises   ?  ?Shoulder Instructions    ? ? ?Home Living Family/patient expects to be discharged to:: Private residence ?Living Arrangements: Spouse/significant other ?Available Help at Discharge: Family;Available 24 hours/day ?Type of Home: House ?Home Access: Stairs to enter ?Entrance Stairs-Number of Steps: 2-3 ?Entrance Stairs-Rails: Right;Left ?Home Layout: Two level;Able to live on main level with bedroom/bathroom ?  ?  ?Bathroom Shower/Tub: Walk-in shower ?  ?Bathroom Toilet: Handicapped height ?  ?  ?Home Equipment: Rollator (4 wheels) ?  ?Additional Comments: Has rollator inside and 1 he  keeps outside to go  in community ?  ? ?  ?Prior Functioning/Environment Prior Level of Function : Independent/Modified Independent ?  ?  ?  ?  ?  ?  ?Mobility Comments: Could ambulate in  community; uses rollator ?ADLs Comments: Independent with ADLs and light IADLs ?  ? ?  ?  ?OT Problem List: Cardiopulmonary status limiting activity ?  ?   ?OT Treatment/Interventions: Self-care/ADL  training;DME and/or AE instruction;Therapeutic activities;Balance training;Therapeutic exercise;Neuromuscular education;Energy conservation;Patient/family education  ?  ?OT Goals(Current goals can be found in the care plan section) Acute Rehab OT Goals ?Patient Stated Goal: to go home ?OT Goal Formulation: With patient ?Time For Goal Achievement: 01/31/22 ?Potential to Achieve Goals: Fair  ?OT Frequency: Min 2X/week ?  ? ?Co-evaluation PT/OT/SLP Co-Evaluation/Treatment: Yes ?Reason for Co-Treatment: To address functional/ADL transfers ?PT goals addressed during session: Mobility/safety with mobility ?OT goals addressed during session: ADL's and self-care ?  ? ?  ?AM-PAC OT "6 Clicks" Daily Activity     ?Outcome Measure Help from another person eating meals?: None ?Help from another person taking care of personal grooming?: None ?Help from another person toileting, which includes using toliet, bedpan, or urinal?: A Little ?Help from another person bathing (including washing, rinsing, drying)?: A Little ?Help from another person to put on and taking off regular upper body clothing?: A Little ?Help from another person to put on and taking off regular lower body clothing?: A Little ?6 Click Score: 20 ?  ?End of Session Equipment  Utilized During Treatment: Gait belt;Rollator (4 wheels) ?Nurse Communication: Other (comment) (ok to participate, HR increase with actiivty.) ? ?Activity Tolerance: Patient tolerated treatment well ?Patient left: in bed;with call bell/phone within reach;with bed alarm set;with family/visitor present ? ?OT Visit Diagnosis: Unsteadiness on feet (R26.81);Other abnormalities of gait and mobility (R26.89)  ?              ?Time: 2992-4268 ?OT Time Calculation (min): 26 min ?Charges:  OT General Charges ?$OT Visit: 1 Visit ?OT Evaluation ?$OT Eval Low Complexity: 1 Low ? ?Bianey Tesoro OTR/L, MS ?Acute Rehabilitation Department ?Office# [phone] ?Pager# [phone] ? ? ?[place] ?01/17/2022, 4:46  PM ?

## 2022-01-17 NOTE — Anesthesia Preprocedure Evaluation (Addendum)
Anesthesia Evaluation  ?Patient identified by MRN, date of birth, ID band ?Patient awake ? ? ? ?Reviewed: ?Allergy & Precautions, NPO status , Patient's Chart, lab work & pertinent test results, reviewed documented beta blocker date and time  ? ?History of Anesthesia Complications ?Negative for: history of anesthetic complications ? ?Airway ?Mallampati: III ? ?TM Distance: >3 FB ?Neck ROM: Full ? ? ? Dental ? ?(+) Dental Advisory Given, Chipped ?  ?Pulmonary ?neg pulmonary ROS,  ?  ?Pulmonary exam normal ? ? ? ? ? ? ? Cardiovascular ?hypertension, Pt. on home beta blockers and Pt. on medications ?Normal cardiovascular exam+ dysrhythmias Atrial Fibrillation  ? ? ?'22 TTE - Hypokinesis of the basal septum. EF 55 to 60%. Left atrial size was severely dilated. Aortic valve  ?regurgitation is trivial. There is moderate dilatation of the aortic root, measuring 47 mm. There is moderate dilatation of the ascending aorta, measuring 45 mm.  ? ?  ?Neuro/Psych ?negative neurological ROS ? negative psych ROS  ? GI/Hepatic ?Neg liver ROS, PUD,   ?Endo/Other  ? ?Obesity ? ? Renal/GU ?negative Renal ROS  ? ?  ?Musculoskeletal ? ?(+) Arthritis ,  ? Abdominal ?  ?Peds ? Hematology ?negative hematology ROS ?(+)   ?Anesthesia Other Findings ? ? Reproductive/Obstetrics ? ?  ? ? ? ? ? ? ? ? ? ? ? ? ? ?  ?  ? ? ? ? ? ? ? ?Anesthesia Physical ?Anesthesia Plan ? ?ASA: 3 ? ?Anesthesia Plan: MAC  ? ?Post-op Pain Management: Minimal or no pain anticipated  ? ?Induction:  ? ?PONV Risk Score and Plan: 1 and Propofol infusion and Treatment may vary due to age or medical condition ? ?Airway Management Planned: Nasal Cannula and Natural Airway ? ?Additional Equipment: None ? ?Intra-op Plan:  ? ?Post-operative Plan:  ? ?Informed Consent: I have reviewed the patients History and Physical, chart, labs and discussed the procedure including the risks, benefits and alternatives for the proposed anesthesia with the  patient or authorized representative who has indicated his/her understanding and acceptance.  ? ? ? ? ? ?Plan Discussed with: CRNA and Anesthesiologist ? ?Anesthesia Plan Comments:   ? ? ? ? ? ?Anesthesia Quick Evaluation ? ?

## 2022-01-17 NOTE — Discharge Summary (Signed)
? ?Physician Discharge Summary  ?Glen [name] CHE:[id number] DOB: 17-Jul-1928 DOA: 01/15/2022 ? ?PCP: [name], MD ? ?Admit date: 01/15/2022 ?Discharge date: 01/17/2022 ?Admitted From: Home ?Disposition: Home ?Recommendations for Outpatient Follow-up:  ?Follow ups as below. ?Please obtain CBC/BMP/Mag at follow up ?Please follow up on the following pending results: Biopsies from EGD ? ?Home Health: Not indicated ?Equipment/Devices: None indicated ? ?Discharge Condition: Stable ?CODE STATUS: Full code ? Follow-up Information   ? ? [name], MD. Schedule an appointment as soon as possible for a visit in 1 week(s).   ?Specialty: Family Medicine ?Contact information: ?[address] ?[place] [address] ?[phone] ? ? ?  ?  ? ?  ?  ? ?  ? ? ?Hospital course ?88 year old M with PMH of A-fib not on anticoagulation due to GIB/PUD/IDA, or esophageal dysmotility, HTN and diverticulosis presenting with dysphagia for a week that has acutely gotten worse over the last 3 days with associated nausea and vomiting, and admitted for dysphagia, intractable nausea and vomiting and dehydration.  GI consulted. ? ?Patient underwent EGD that showed  ?- Presbyesophagus/motility disorder ?- Moderate Schatzki ring. Biopsied. Dilated. ?- Small hiatal hernia. ?- Gastritis. Biopsied. ?- Duodenitis. Biopsied. ?- Nonobstructing esophageal food bolus s/p endoscopic disimpaction ? ?Tolerated soft diet after procedure.  Cleared for discharge by GI on omeprazole for outpatient follow-up for in 6 to 8 weeks.  No need identified by therapy.  ? ?See individual problem list below for more on hospital course. ? ?Problems addressed during this hospitalization ?* Esophageal dysphagia ?Patient with history of Schatzki ring, tortuous esophagus and dysmotility.  Comes in with progressive dysphagia, and vomiting/regurgitation.  Status post EGD and dilation as above. ? ?Intractable nausea and vomiting ?Resolved. ? ?Atrial fibrillation, chronic  ([place]) ?In A-fib but rate controlled. ?Continue home meds. ? ?Dehydration ?Resolved. ? ?Physical deconditioning ?Uses walker at baseline. ?No need identified by therapy. ? ?Peptic ulcer disease ?Continue omeprazole.  Allergic to Protonix ? ?Peripheral edema ?He has 1+ BLE edema, right> left.  Chronic per patient ?-Continue home Lasix ? ?Essential hypertension ?Continue home meds. ? ? ? ? ?  ?  ?  ?  ? ?  ? ?Vital signs ?Vitals:  ? 01/17/22 1410 01/17/22 1415 01/17/22 1420 01/17/22 1430  ?BP: (!) 104/54  110/61 107/66  ?Pulse: 88 87 92 87  ?Temp:      ?Resp: 16 20 (!) 24 (!) 23  ?Height:      ?Weight:      ?SpO2: 100% 100% 96% 95%  ?TempSrc:      ?BMI (Calculated):      ?  ? ?Discharge exam ? ?GENERAL: No apparent distress.  Nontoxic. ?HEENT: MMM.  Vision and hearing grossly intact.  ?NECK: Supple.  No apparent JVD.  ?RESP:  No IWOB.  Fair aeration bilaterally. ?CVS: Irregular rhythm.  Normal rate.  Heart sounds normal.  ?ABD/GI/GU: BS+. Abd soft, NTND.  ?MSK/EXT:  Moves extremities. No apparent deformity.  Trace BLE edema. ?SKIN: no apparent skin lesion or wound ?NEURO: Awake and alert. Oriented appropriately.  No apparent focal neuro deficit. ?PSYCH: Calm. Normal affect.  ? ?Discharge Instructions ?Discharge Instructions   ? ? Call MD for:  persistant nausea and vomiting   Complete by: As directed ?  ? Diet - low sodium heart healthy   Complete by: As directed ?  ? Discharge instructions   Complete by: As directed ?  ? It has been a pleasure taking care of you! ? ?You were hospitalized  due to difficulty swallowing and vomiting for which you have been treated endoscopically with dilation of the esophagus and dislodgment of food material.  We recommend continuing soft diet and chewing your food very well before swallowing.  We also recommend staying upright when you eat and drink, and for at least 30 minutes to 1 hour afterward.  Avoid any over-the-counter pain medication other than plain Tylenol.  Continue taking  omeprazole.  ? ?Follow-up with your primary care doctor in 1 to 2 weeks or sooner if needed.  Follow-up with your gastroenterologist in 6-8 weeks ? ? ?Take care,  ? Increase activity slowly   Complete by: As directed ?  ? ?  ? ?Allergies as of 01/17/2022   ? ?   Reactions  ? Protonix [pantoprazole] Other (See Comments)  ? Hallucinations  ? Codeine Other (See Comments)  ? constpation  ? Ferrous Sulfate Itching, Swelling  ? ?  ? ?  ?Medication List  ?  ? ?STOP taking these medications   ? ?Tylenol PM Extra Strength 25-500 MG Tabs tablet ?Generic drug: diphenhydramine-acetaminophen ?  ? ?  ? ?TAKE these medications   ? ?furosemide 20 MG tablet ?Commonly known as: LASIX ?Take 1 tablet (20 mg total) by mouth daily. ?Start taking on: January 20, 2022 ?What changed: These instructions start on January 20, 2022. If you are unsure what to do until then, ask your doctor or other care provider. ?  ?metoprolol tartrate 100 MG tablet ?Commonly known as: LOPRESSOR ?Take 1 tablet (100 mg total) by mouth 2 (two) times daily. ?  ?omeprazole 40 MG capsule ?Commonly known as: PRILOSEC ?Take 1 capsule (40 mg total) by mouth daily. ?  ?polyethylene glycol powder 17 GM/SCOOP powder ?Commonly known as: MiraLax ?Take 17 g by mouth 2 (two) times daily as needed for moderate constipation. ?  ? ?  ? ? ?Consultations: ?Gastroenterology ? ?Procedures/Studies: ?EGD as above ? ? ?X-ray chest PA and lateral ? ?Result Date: 01/16/2022 ?CLINICAL DATA:  Preop procedure.  EGD later today. EXAM: CHEST - 2 VIEW COMPARISON:  Chest x-ray dated 07/12/2020. FINDINGS: Borderline cardiomegaly, stable. Mild chronic scarring/atelectasis at the LEFT lung base. Lungs otherwise clear. No pleural effusion or pneumothorax. Chronic asymmetry of the RIGHT upper mediastinum suggesting chronic thyromegaly. IMPRESSION: 1. No active cardiopulmonary disease. No evidence of pneumonia or pulmonary edema. 2. Stable borderline cardiomegaly. Electronically Signed   By: Franki Cabot  M.D.   On: 01/16/2022 09:05   ? ? ? ? ?The results of significant diagnostics from this hospitalization (including imaging, microbiology, ancillary and laboratory) are listed below for reference.   ? ? ?Microbiology: ?Recent Results (from the past 240 hour(s))  ?Urine Culture     Status: Abnormal  ? Collection Time: 01/16/22 11:08 AM  ? Specimen: Urine, Clean Catch  ?Result Value Ref Range Status  ? Specimen Description   Final  ?  URINE, CLEAN CATCH ?Performed at [facility], [place] [address] [place], [place] [address] ?  ? Special Requests   Final  ?  Immunocompromised ?Performed at [facility], [place] [address], [place], [place] [address] ?  ? Culture MULTIPLE SPECIES PRESENT, SUGGEST RECOLLECTION (A)  Final  ? Report Status 01/17/2022 FINAL  Final  ?  ? ?Labs: ? ?CBC: ?Recent Labs  ?Lab 01/15/22 ?1658 01/16/22 ?0604 01/17/22 ?0603  ?WBC 11.9* 8.6 6.9  ?NEUTROABS 8.6*  --   --   ?HGB 17.3* 15.2 15.4  ?HCT 52.6* 46.1 44.5  ?MCV  97.2 100.2* 97.6  ?PLT 213 156 169  ? ?BMP &GFR ?Recent Labs  ?Lab 01/15/22 ?1658 01/16/22 ?0604 01/17/22 ?0603  ?NA 139 139 139  ?K 4.0 3.8 3.7  ?CL 105 111 110  ?CO2 19* 19* 24  ?GLUCOSE 103* 77 94  ?BUN '22 20 15  '$ ?CREATININE 1.08 0.90 0.85  ?CALCIUM 9.8 8.5* 8.5*  ?MG  --   --  1.9  ?PHOS  --   --  2.6  ? ?Estimated Creatinine Clearance: 69.8 mL/min (by C-G formula based on SCr of 0.85 mg/dL). ?Liver & Pancreas: ?Recent Labs  ?Lab 01/15/22 ?1658 01/16/22 ?0604 01/17/22 ?0603  ?AST '23 22 23  '$ ?ALT '21 18 18  '$ ?ALKPHOS 77 63 65  ?BILITOT 1.3* 1.4* 1.5*  ?PROT 7.4 5.7* 5.7*  ?ALBUMIN 4.6 3.3* 3.2*  ? ?Recent Labs  ?Lab 01/15/22 ?1658  ?LIPASE 18  ? ?No results for input(s): AMMONIA in the last 168 hours. ?Diabetic: ?No results for input(s): HGBA1C in the last 72 hours. ?Recent Labs  ?Lab 01/17/22 ?2130 01/17/22 ?8657 01/17/22 ?8469 01/17/22 ?1131 01/17/22 ?1608  ?GLUCAP 66* 141* 76 91 92  ? ?Cardiac Enzymes: ?No results for input(s): CKTOTAL, CKMB,  CKMBINDEX, TROPONINI in the last 168 hours. ?No results for input(s): PROBNP in the last 8760 hours. ?Coagulation Profile: ?Recent Labs  ?Lab 01/16/22 ?0604  ?INR 1.1  ? ?Thyroid Function Tests: ?No results for inpu

## 2022-01-17 NOTE — Progress Notes (Signed)
**Note Glen-Identified via Obfuscation** PT Cancellation Note ? ?Patient Details ?Name: [name] ?MRN: [id number] ?DOB: June 22, 1928 ? ? ?Cancelled Treatment:    Reason Eval/Treat Not Completed: Other (comment) ? ?Spoke with RN who reports pt upset/anxious -feeling that HR elevated and wanting to see MD, NPO for procedure so does not want to do much till he eats, and then has EGD in the afternoon.  Will f/u at later date. ? ?Abran Richard, PT ?Acute Rehab Services ?Pager [phone] ?[facility] [phone] ? ?Glen [name] ?01/17/2022, 11:58 AM ?

## 2022-01-17 NOTE — Evaluation (Signed)
Physical Therapy Evaluation ?Patient Details ?Name: Glen [name] ?MRN: [id number] ?DOB: 1928-06-24 ?Today's Date: 01/17/2022 ? ?History of Present Illness ? Patient is a 88 year old male who presented on 01/15/22 with nausea and vomitting with dysphagia. Patient was admitted with dysphagia and dehydration. Patient underwent EGD this AM.PMH: HTN, diverticulosis, A fib, schatzki ring  ?Clinical Impression ? Pt admitted with above diagnosis. At baseline, pt resides at home with wife and uses rollator for mobility. He denies falls in the last 6 months.  Today, pt with impulsivity, very talkative, and decreased safety with use of rollator brakes - highly suspect this is baseline and son indicated it is baseline for pt.  Pt ambulated 120'x2 with rollator - he demonstrated steady balance, required cues for safe transfers.  HR was 90's rest and up to 137 bpm and pt with increased resp rate (but still talkative) with activity, cues to focus on breathing.  Pt appears to be near baseline mobility level and from mobility perspective safe to d/c home, but will benefit from PT while hospitalized to address safety/impulsiveness with mobility.  Pt currently with functional limitations due to the deficits listed below (see PT Problem List). Pt will benefit from skilled PT to increase their independence and safety with mobility to allow discharge to the venue listed below.   ?   ?   ? ?Recommendations for follow up therapy are one component of a multi-disciplinary discharge planning process, led by the attending physician.  Recommendations may be updated based on patient status, additional functional criteria and insurance authorization. ? ?Follow Up Recommendations No PT follow up ? ?  ?Assistance Recommended at Discharge Intermittent Supervision/Assistance  ?Patient can return home with the following ? Help with stairs or ramp for entrance;Assistance with cooking/housework;Assist for transportation ? ?  ?Equipment Recommendations  None recommended by PT  ?Recommendations for Other Services ?    ?  ?Functional Status Assessment Patient has had a recent decline in their functional status and demonstrates the ability to make significant improvements in function in a reasonable and predictable amount of time.  ? ?  ?Precautions / Restrictions Precautions ?Precautions: Fall ?Precaution Comments: monitor HR ?Restrictions ?Weight Bearing Restrictions: No  ? ?  ? ?Mobility ? Bed Mobility ?Overal bed mobility: Needs Assistance ?Bed Mobility: Supine to Sit, Sit to Supine ?  ?  ?Supine to sit: Supervision ?Sit to supine: Supervision ?  ?  ?  ? ?Transfers ?Overall transfer level: Needs assistance ?Equipment used: Rollator (4 wheels) ?Transfers: Sit to/from Stand ?Sit to Stand: Min guard ?  ?  ?  ?  ?  ?General transfer comment: Pt requiring cues for safety with locking rollator brakes to sit and "not plopping" down on bed. ?  ? ?Ambulation/Gait ?Ambulation/Gait assistance: Min guard ?Gait Distance (Feet): 120 Feet (120'x2) ?Assistive device: Rollator (4 wheels) ?Gait Pattern/deviations: Step-through pattern ?Gait velocity: decreased ?  ?  ?General Gait Details: Min guard for safety; cues for safety with rollator when taking rest breaks ? ?Stairs ?  ?  ?  ?  ?  ? ?Wheelchair Mobility ?  ? ?Modified Rankin (Stroke Patients Only) ?  ? ?  ? ?Balance Overall balance assessment: Needs assistance ?Sitting-balance support: No upper extremity supported, Feet supported ?Sitting balance-Leahy Scale: Good ?  ?  ?Standing balance support: Bilateral upper extremity supported, No upper extremity supported ?Standing balance-Leahy Scale: Fair ?Standing balance comment: rollator to ambulate but could static stand without AD (adjusted pants in standing) ?  ?  ?  ?  ?  ?  ?  ?  ?  ?  ?  ?   ? ? ? ?  Pertinent Vitals/Pain Pain Assessment ?Pain Assessment: Faces ?Faces Pain Scale: Hurts a little bit ?Pain Location: discomfort across thoracic area of back ?Pain Descriptors /  Indicators: Discomfort ?Pain Intervention(s): Monitored during session  ? ? ?Home Living Family/patient expects to be discharged to:: Private residence ?Living Arrangements: Spouse/significant other ?Available Help at Discharge: Family;Available 24 hours/day ?Type of Home: House ?Home Access: Stairs to enter ?Entrance Stairs-Rails: Right;Left ?Entrance Stairs-Number of Steps: 2-3 ?  ?Home Layout: Two level;Able to live on main level with bedroom/bathroom ?Home Equipment: Rollator (4 wheels) ?Additional Comments: Has rollator inside and another he  keeps outside to go in community  ?  ?Prior Function Prior Level of Function : Independent/Modified Independent ?  ?  ?  ?  ?  ?  ?Mobility Comments: Could ambulate in  community with rest breaks; uses rollator ?ADLs Comments: Independent with ADLs and light IADLs ?  ? ? ?Hand Dominance  ? Dominant Hand: Right ? ?  ?Extremity/Trunk Assessment  ? Upper Extremity Assessment ?Upper Extremity Assessment: Defer to OT evaluation ?  ? ?Lower Extremity Assessment ?Lower Extremity Assessment: Overall WFL for tasks assessed ?  ? ?Cervical / Trunk Assessment ?Cervical / Trunk Assessment: Kyphotic  ?Communication  ? Communication: HOH  ?Cognition Arousal/Alertness: Awake/alert ?Behavior During Therapy: Impulsive ?Overall Cognitive Status: Difficult to assess ?  ?  ?  ?  ?  ?  ?  ?  ?  ?  ?  ?  ?  ?  ?  ?  ?General Comments: Patient was very talkative during session with increased impulsive movements. Patients son indicated that this was near baseline for patient. Patient was noted to have poor safety awareness with rollator breaks. ?  ?  ? ?  ?General Comments General comments (skin integrity, edema, etc.): Pt's HR 90's rest and up to 137 bpm max with ambulation.  Pt does get SOB with ambulation but also is very talkative and cued to focus on breathing.  Son indicated that this is baseline as pt gets excited and likes to talk. O2 sats were 95%.  Notified RN and MD of elevated HR. ? ?   ?Exercises    ? ?Assessment/Plan  ?  ?PT Assessment Patient needs continued PT services  ?PT Problem List Decreased strength;Decreased mobility;Decreased safety awareness;Decreased coordination;Decreased activity tolerance;Cardiopulmonary status limiting activity;Decreased balance;Decreased knowledge of use of DME ? ?   ?  ?PT Treatment Interventions DME instruction;Therapeutic activities;Gait training;Therapeutic exercise;Patient/family education;Balance training;Functional mobility training;Stair training   ? ?PT Goals (Current goals can be found in the Care Plan section)  ?Acute Rehab PT Goals ?Patient Stated Goal: return home ?PT Goal Formulation: With patient/family ?Time For Goal Achievement: 01/31/22 ?Potential to Achieve Goals: Good ? ?  ?Frequency   ?  ? ? ?Co-evaluation PT/OT/SLP Co-Evaluation/Treatment: Yes ?Reason for Co-Treatment: For patient/therapist safety ?PT goals addressed during session: Mobility/safety with mobility ?OT goals addressed during session: ADL's and self-care ?  ? ? ?  ?AM-PAC PT "6 Clicks" Mobility  ?Outcome Measure Help needed turning from your back to your side while in a flat bed without using bedrails?: A Little ?Help needed moving from lying on your back to sitting on the side of a flat bed without using bedrails?: A Little ?Help needed moving to and from a bed to a chair (including a wheelchair)?: A Little ?Help needed standing up from a chair using your arms (e.g., wheelchair or bedside chair)?: A Little ?Help needed to walk in hospital room?: A Little ?Help needed climbing 3-5 steps  with a railing? : A Little ?6 Click Score: 18 ? ?  ?End of Session Equipment Utilized During Treatment: Gait belt ?Activity Tolerance: Patient tolerated treatment well ?Patient left: in bed;with call bell/phone within reach;with bed alarm set ?Nurse Communication: Mobility status;Other (comment) (son requesting pt shower before d/c) ?PT Visit Diagnosis: Other abnormalities of gait and mobility  (R26.89) ?  ? ?Time: 8022-1798 ?PT Time Calculation (min) (ACUTE ONLY): 29 min ? ? ?Charges:   PT Evaluation ?$PT Eval Moderate Complexity: 1 Mod ?  ?  ?   ? ? ?Abran Richard, PT ?Acute Rehab Services ?Pager [phone]

## 2022-01-17 NOTE — Transfer of Care (Signed)
Immediate Anesthesia Transfer of Care Note ? ?Patient: Glen [name] ? ?Procedure(s) Performed: ESOPHAGOGASTRODUODENOSCOPY (EGD) ?BIOPSY ?BALLOON DILATION ? ?Patient Location: PACU ? ?Anesthesia Type:MAC ? ?Level of Consciousness: sedated, patient cooperative and responds to stimulation ? ?Airway & Oxygen Therapy: Patient Spontanous Breathing and Patient connected to face mask oxygen ? ?Post-op Assessment: Report given to RN, Post -op Vital signs reviewed and stable and MDA notifed of low BP required phenylephrine ? ?Post vital signs: Reviewed and stable ? ?Last Vitals:  ?Vitals Value Taken Time  ?BP 106/51 01/17/22 1409  ?Temp    ?Pulse 88 01/17/22 1410  ?Resp 16 01/17/22 1410  ?SpO2 100 % 01/17/22 1410  ?Vitals shown include unvalidated device data. ? ?Last Pain:  ?Vitals:  ? 01/17/22 1403  ?TempSrc:   ?PainSc: Asleep  ?   ? ?  ? ?Complications: No notable events documented. ?

## 2022-01-17 NOTE — Progress Notes (Signed)
OT Cancellation Note ? ?Patient Details ?Name: Glen [name] ?MRN: [id number] ?DOB: Feb 28, 1928 ? ? ?Cancelled Treatment:    Reason Eval/Treat Not Completed: Other (comment) ?Patient is pending EGD today at 2pm with patient upset with nursing about NPO status. OT to continue to follow and check back as schedule will allow.  ?Rohil Lesch OTR/L, MS ?Acute Rehabilitation Department ?Office# [phone] ?Pager# [phone] ? ?01/17/2022, 11:08 AM ?

## 2022-01-17 NOTE — Progress Notes (Signed)
Hypoglycemic Event ? ?CBG: 66 ? ?Treatment: D50 25 mL (12.5 gm) ? ?Symptoms: None ? ?Follow-up CBG: Time: 0515 CBG Result:141 ? ?Possible Reasons for Event: Inadequate meal intake ? ?Comments/MD notified:J. Olena Heckle, NP ? ? ? ?Glen [name] ? ? ?

## 2022-01-17 NOTE — Interval H&P Note (Signed)
History and Physical Interval Note: ? ?01/17/2022 ?1:22 PM ? ?Glen [name]  has presented today for surgery, with the diagnosis of Dysphagia, history of Schatzki ring.  The various methods of treatment have been discussed with the patient and family. After consideration of risks, benefits and other options for treatment, the patient has consented to  Procedure(s): ?ESOPHAGOGASTRODUODENOSCOPY (EGD) (N/A) as a surgical intervention.  The patient's history has been reviewed, patient examined, no change in status, stable for surgery.  I have reviewed the patient's chart and labs.  Questions were answered to the patient's satisfaction.   ? ? ?Jackquline Denmark ? ? ?

## 2022-01-17 NOTE — Progress Notes (Signed)
Hypoglycemic Event ? ?CBG: 66 ? ?Treatment: D50 25 mL (12.5 gm) ? ?Symptoms: None ? ?Follow-up CBG: [id number] CBG Result:105 ? ?Possible Reasons for Event: Inadequate meal intake ? ?Comments/MD notified: Clarene Essex, NP ? ? ? ?Terence Lux ? ? ?

## 2022-01-17 NOTE — Anesthesia Postprocedure Evaluation (Signed)
Anesthesia Post Note ? ?Patient: [name] ? ?Procedure(s) Performed: ESOPHAGOGASTRODUODENOSCOPY (EGD) ?BIOPSY ?BALLOON DILATION ? ?  ? ?Patient location during evaluation: PACU ?Anesthesia Type: MAC ?Level of consciousness: awake and alert ?Pain management: pain level controlled ?Vital Signs Assessment: post-procedure vital signs reviewed and stable ?Respiratory status: spontaneous breathing, nonlabored ventilation and respiratory function stable ?Cardiovascular status: stable and blood pressure returned to baseline ?Anesthetic complications: no ? ? ?No notable events documented. ? ?Last Vitals:  ?Vitals:  ? 01/17/22 1420 01/17/22 1430  ?BP: 110/61 107/66  ?Pulse: 92 87  ?Resp: (!) 24 (!) 23  ?Temp:    ?SpO2: 96% 95%  ?  ?Last Pain:  ?Vitals:  ? 01/17/22 1430  ?TempSrc:   ?PainSc: 0-No pain  ? ? ?  ?  ?  ?  ?  ?  ? ?Glen [name] ? ? ? ? ?

## 2022-01-17 NOTE — Op Note (Signed)
[facility] ?Patient Name: Glen [name] ?Procedure Date: 01/17/2022 ?MRN: [id number] ?Attending MD: Glen [name] , MD ?Date of Birth: 1928/04/25 ?CSN: [id number] ?Age: 88 ?Admit Type: Inpatient ?Procedure:                Upper GI endoscopy ?Indications:              Dysphagia. GERD ?Providers:                Glen Denmark, MD, Glen Junes, RN, Tyna Jaksch  ?                          Technician ?Referring MD:              ?Medicines:                Monitored Anesthesia Care ?Complications:            No immediate complications. ?Estimated Blood Loss:     Estimated blood loss: none. ?Procedure:                Pre-Anesthesia Assessment: ?                          - Prior to the procedure, a History and Physical  ?                          was performed, and patient medications and  ?                          allergies were reviewed. The patient's tolerance of  ?                          previous anesthesia was also reviewed. The risks  ?                          and benefits of the procedure and the sedation  ?                          options and risks were discussed with the patient.  ?                          All questions were answered, and informed consent  ?                          was obtained. Prior Anticoagulants: The patient has  ?                          taken no previous anticoagulant or antiplatelet  ?                          agents. ASA Grade Assessment: II - A patient with  ?                          mild systemic disease. After reviewing the risks  ?                          and  benefits, the patient was deemed in  ?                          satisfactory condition to undergo the procedure. ?                          After obtaining informed consent, the endoscope was  ?                          passed under direct vision. Throughout the  ?                          procedure, the patient's blood pressure, pulse, and  ?                          oxygen saturations were monitored  continuously. The  ?                          GIF-H190 ([id number]) Olympus endoscope was introduced  ?                          through the mouth, and advanced to the second part  ?                          of duodenum. The upper GI endoscopy was  ?                          accomplished without difficulty. The patient  ?                          tolerated the procedure well. ?Scope In: ?Scope Out: ?Findings: ?     And nonobstructing food bolus was noted in the mid esophagus. This could  ?     easily be pushed into the stomach. The mid esophagus and distal  ?     esophagus were moderately tortuous s/o presbyesophagus/motility disorder ?     A moderate Schatzki ring was found at the gastroesophageal junction with  ?     luminal diameter approximately 12 mm. Biopsies were taken with a cold  ?     forceps for histology from all 4 quadrants. A TTS dilator was passed  ?     through the scope. Dilation with a 12-13.5-15 mm balloon dilator was  ?     performed to 15 mm. The dilation site was examined and showed moderate  ?     mucosal disruption and moderate improvement in luminal narrowing.  ?     Estimated blood loss was minimal. ?     A small hiatal hernia was present. ?     Scattered mild inflammation characterized by erythema was found in the  ?     entire examined stomach. Biopsies were taken with a cold forceps for  ?     histology. ?     Localized mild inflammation characterized by erythema was found in the  ?     first portion of the duodenum with nonobstructing mild duodenal  ?     stricture D1/D2 junction s/o previous peptic ulcer diseas. Few biopsies  ?  were taken with a cold forceps for histology. ?Impression:               - Presbyesophagus/motility disorder ?                          - Moderate Schatzki ring. Biopsied. Dilated. ?                          - Small hiatal hernia. ?                          - Gastritis. Biopsied. ?                          - Duodenitis. Biopsied. ?                          -  Nonobstructing esophageal food bolus s/p  ?                          endoscopic disimpaction ?Moderate Sedation: ?     Not Applicable - Patient had care per Anesthesia. ?Recommendation:           - Return patient to hospital ward for ongoing care. ?                          - Soft diet x 24 hours, then regular previous diet.  ?                          I have instructed patient to chew foods especially  ?                          meats and breads well and eat slowly. ?                          - Continue omeprazole 40 mg p.o. once a day (he is  ?                          tolerating it well. He had hallucinations with  ?                          Protonix) ?                          - Await pathology results. ?                          - Recommend barium swallow (with Ba tab) prior to  ?                          GI follow-up appointment. ?                          - The findings and recommendations were discussed  ?  with the patient's son Glen [name]. ?                          - Return to GI clinic in 6-8 weeks with Dr Hilarie Fredrickson  ?                          or APP clinic. ?Procedure Code(s):        --- Professional --- ?                          [phone], Esophagogastroduodenoscopy, flexible,  ?                          transoral; with transendoscopic balloon dilation of  ?                          esophagus (less than 30 mm diameter) ?                          43239, 59, Esophagogastroduodenoscopy, flexible,  ?                          transoral; with biopsy, single or multiple ?Diagnosis Code(s):        --- Professional --- ?                          Q39.9, Congenital malformation of esophagus,  ?                          unspecified ?                          K22.2, Esophageal obstruction ?                          K44.9, Diaphragmatic hernia without obstruction or  ?                          gangrene ?                          K29.70, Gastritis, unspecified, without bleeding ?                           K29.80, Duodenitis without bleeding ?                          R13.10, Dysphagia, unspecified ?CPT copyright 2019 American Medical Association. All rights reserved. ?The codes documented in this report are preliminary and upon coder review may  ?be revised to meet current compliance requirements. ?Glen Denmark, MD ?01/17/2022 2:10:19 PM ?This report has been signed electronically. ?Number of Addenda: 0 ?

## 2022-01-18 ENCOUNTER — Telehealth: Payer: Self-pay | Admitting: Gastroenterology

## 2022-01-18 ENCOUNTER — Telehealth: Payer: Self-pay | Admitting: Family Medicine

## 2022-01-18 ENCOUNTER — Telehealth: Payer: Self-pay

## 2022-01-18 ENCOUNTER — Encounter (HOSPITAL_COMMUNITY): Payer: Self-pay | Admitting: Gastroenterology

## 2022-01-18 ENCOUNTER — Telehealth: Payer: Self-pay | Admitting: Cardiovascular Disease

## 2022-01-18 LAB — SURGICAL PATHOLOGY

## 2022-01-18 NOTE — Telephone Encounter (Signed)
Pt states he was recently released from the hospital. He states his ankles started swelling and by morning they were swollen up to his knees. Currently being triaged. ?

## 2022-01-18 NOTE — Telephone Encounter (Signed)
Patient called, states EGD done yesterday by Dr. Lyndel Safe. Per patient, Dr. Lyndel Safe recommended holding off on his lasix medication until 01/20/22. Patient complains of pain/swollen ankle and legs and would like to know if it's okay to take his lasix before Friday. Please advise.  ?

## 2022-01-18 NOTE — Telephone Encounter (Signed)
I think he is already following with me, but I am fine with it.

## 2022-01-18 NOTE — Telephone Encounter (Signed)
Pt is scheduled for an ED follow up on 01/23/22 with [name] at 9:40 ? ?Patient ?Name: ?Glen [name] ?EDGE ?Gender: Male ?DOB: 21-Jul-1928 ?Age: 88 Y 33 M 27 ?D ?Return ?Phone ?Number: ?[phone] ?(Primary) ?Address: ?City/ ?State/ ?Zip: ?[place] ? [address] ?Client [place] [facility] [place] [facility] - ?Client ?Presenter, broadcasting at [place] [facility] ?Provider Dimas Chyle- MD ?Contact Type Call ?Who Is Calling Patient / Member / Family / Caregiver ?Call Type Triage / Clinical ?Relationship To Patient Self ?Return Phone Number [phone] (Primary) ?Chief Complaint Leg Swelling And Edema ?Reason for Call Symptomatic / Request for Health Information ?Initial Comment Caller states pt was discharged from hospital last ?night, leg is swollen from ankle to knee- taking ?lasikcs ?Translation No ?Nurse Assessment ?Nurse: Loletha Carrow RN, Ronalee Belts Date/Time (Eastern Time): 01/18/2022 9:22:17 AM ?Confirm and document reason for call. If ?symptomatic, describe symptoms. ?---Bilateral lower ext swelling from ankle up to the ?knees. Was DC'd from the hospital last night for ?vomiting Denies any other s/s ?Does the patient have any new or worsening ?symptoms? ---Yes ?Will a triage be completed? ---Yes ?Related visit to physician within the last 2 weeks? ---Yes ?Does the PT have any chronic conditions? (i.e. ?diabetes, asthma, this includes High risk factors for ?pregnancy, etc.) ?---Yes ?List chronic conditions. ---HR elevated, fluid retention ?Is this a behavioral health or substance abuse call? ---No ?Nurse: Loletha Carrow, RN, Ronalee Belts Date/Time (Eastern Time): 01/18/2022 9:32:38 AM ?Confirm and document reason for call. If ?symptomatic, describe symptoms. ---please see previous assessment ?Does the patient have any new or worsening ?symptoms? ---Yes ?Will a triage be completed? ---Yes ?Related visit to physician within the last 2 weeks? ---Yes ?Nurse Assessment ?Does the PT have any chronic conditions? (i.e. ?diabetes, asthma,  this includes High risk factors for ?pregnancy, etc.) ?---Unknown ?Is this a behavioral health or substance abuse call? ---No ?Guidelines ?Guideline Title Affirmed Question Affirmed Notes Nurse Date/Time (Eastern ?Time) ?Difficult Call [1] Overly worried ?caller AND [2] can't ?be reassured by ?triager ?Loletha Carrow, RN, Ronalee Belts 01/18/2022 9:33:45 ?AM ?Disp. Time (Eastern ?Time) Disposition Final User ?01/18/2022 9:34:25 AM See HCP within 4 Hours (or ?PCP triage) ?Yes Emch, RN, Ronalee Belts ?Caller Disagree/Comply Disagree ?Caller Understands No ?PreDisposition Call Doctor ?Care Advice Given Per Guideline ?SEE HCP (OR PCP TRIAGE) WITHIN 4 HOURS: * IF OFFICE WILL BE OPEN: You need to be seen within the next 3 or 4 ?hours. Call your doctor (or NP/PA) now or as soon as the office opens. CALL BACK IF: * You become worse ?Comments ?User: Doretha Sou, RN Date/Time Eilene Ghazi Time): 01/18/2022 9:35:58 AM ?Caller Will Not answer a question when asked.I advised the caller to contact the office now about his medication ?question ?User: Doretha Sou, RN Date/Time Eilene Ghazi Time): 01/18/2022 9:41:26 AM ?Hulen Skains and spoke W office staff about contacting this caller to hopefully provide him with an answer ?Referrals ?REFERRED TO PCP OFFICE ?

## 2022-01-18 NOTE — Telephone Encounter (Signed)
Transition Care Management Unsuccessful Follow-up Telephone Call ? ?Date of discharge and from where:  [facility] 01/17/22 ? ?Attempts:  1st Attempt ? ?Reason for unsuccessful TCM follow-up call:  reached Pt he declined to speak at this time  ? ? ? ?

## 2022-01-18 NOTE — Telephone Encounter (Signed)
Pt states that he is having swelling in his arms, feet, and half way up legs: Pt stated that Dr. Lyndel Safe stated that he needs to stop the lasix until Friday 01/20/2022: ?Please advise  ?

## 2022-01-18 NOTE — Telephone Encounter (Signed)
Patient needs OV for evaluation  ?

## 2022-01-18 NOTE — Telephone Encounter (Signed)
Patient has OV on 01/23/2022 ?

## 2022-01-18 NOTE — Telephone Encounter (Signed)
Triage has called and stated they could not get any answers out of him. I returned a call to try to get pt to come in and he states he cannot. He did state the ED provider stated he needed to go off his Lasix until Friday. He believes this is why his legs are swelling. He states he is wanting to hear from Dr [name]. Please advise ?

## 2022-01-18 NOTE — Telephone Encounter (Signed)
New Message: ? ? ? ? ?Patient would like to switch from Dr Claiborne Billings to Dr Harrell Gave please. It looks like patient have already seen Dr Harrell Gave. Is this alright with you? ?

## 2022-01-18 NOTE — Telephone Encounter (Signed)
In regard to previous note sent: Spoke with Pt Son [name] after previous note sent : [name] stated that pt was discharged from  hospital and they informed pt to stop taking the Lasix until 01/20/2022: [name] was notified that this  seems to be one of the [place] that may have gave him this information and for him to contact his PCP: [name] stated that Q[place] has an appointment on Monday to see his PCP: ?Please see prior note also and please  advise  ? ? ?

## 2022-01-19 ENCOUNTER — Other Ambulatory Visit: Payer: Self-pay

## 2022-01-19 DIAGNOSIS — R1310 Dysphagia, unspecified: Secondary | ICD-10-CM

## 2022-01-19 NOTE — Telephone Encounter (Signed)
I did not stop Lasix-no reason to from GI standpoint ?Please inform patient's son. ?RG ?

## 2022-01-19 NOTE — Telephone Encounter (Signed)
Glen [name] notified of Dr. Lyndel Safe response  that he did not stop Lasix-no reason to from GI standpoint:  ? ?Glen [name] was notified that Dr. Lyndel Safe recommendations from  recent EGD was for Glen [name] to have a Barium Swallow; ?Barium swallow ordered and scheduled for 01/25/2022 at 11:00 at [facility]: Pt to arrive at 10:30: Nothing to eat or drink three hours prior: Glen [name] Made aware ?Glen [name] verbalized understanding with all questions answered.   ?

## 2022-01-19 NOTE — Telephone Encounter (Signed)
See previous messages,  ?Patient Lasix sent by ED provider- and switched providers to Dr.Christopher.  ?Thanks! ?

## 2022-01-19 NOTE — Telephone Encounter (Signed)
RX was sent in my hospital-  ?Patient is seeing Dr.Christopher (see provider switch)  ? ?Thanks! ?

## 2022-01-19 NOTE — Telephone Encounter (Signed)
Left message for pt son [name] to call back  ?

## 2022-01-22 NOTE — Telephone Encounter (Signed)
ok 

## 2022-01-23 ENCOUNTER — Ambulatory Visit (INDEPENDENT_AMBULATORY_CARE_PROVIDER_SITE_OTHER): Payer: Medicare Other | Admitting: Family Medicine

## 2022-01-23 ENCOUNTER — Encounter: Payer: Self-pay | Admitting: Family Medicine

## 2022-01-23 VITALS — BP 107/75 | HR 89 | Temp 97.2°F | Ht 72.0 in | Wt 213.8 lb

## 2022-01-23 DIAGNOSIS — R609 Edema, unspecified: Secondary | ICD-10-CM | POA: Diagnosis not present

## 2022-01-23 DIAGNOSIS — K279 Peptic ulcer, site unspecified, unspecified as acute or chronic, without hemorrhage or perforation: Secondary | ICD-10-CM

## 2022-01-23 DIAGNOSIS — I482 Chronic atrial fibrillation, unspecified: Secondary | ICD-10-CM

## 2022-01-23 DIAGNOSIS — I10 Essential (primary) hypertension: Secondary | ICD-10-CM | POA: Diagnosis not present

## 2022-01-23 DIAGNOSIS — R1319 Other dysphagia: Secondary | ICD-10-CM | POA: Diagnosis not present

## 2022-01-23 NOTE — Assessment & Plan Note (Signed)
He is doing well on prilosec '40mg'$  daily. HE will be following up with GI in a few weeks.  ?

## 2022-01-23 NOTE — Assessment & Plan Note (Signed)
Continue lasix per cardiology.  ?

## 2022-01-23 NOTE — Assessment & Plan Note (Signed)
Doing much better since since his hospitalization and EGD with dilation. He has been doing well with his prilosec without side effects. He has been able to tolerate a normal diet. He will follow up with GI in a few weeks.  ?

## 2022-01-23 NOTE — Progress Notes (Signed)
? ?Glen [name] is a 88 y.o. male who presents today for an office visit. ? ?Assessment/Plan:  ?Chronic Problems Addressed Today: ?Atrial fibrillation, chronic (HCC) ?Stable. Rate controlled today on metoprolol tartrate '100mg'$  twice daily. Follows with cardiology.  ? ?Essential hypertension ?At goal on metoprolol tartrate '100mg'$  twice daily.  ? ?Peripheral edema ?Continue lasix per cardiology.  ? ?Peptic ulcer disease ?He is doing well on prilosec '40mg'$  daily. HE will be following up with GI in a few weeks.  ? ?Esophageal dysphagia ?Doing much better since since his hospitalization and EGD with dilation. He has been doing well with his prilosec without side effects. He has been able to tolerate a normal diet. He will follow up with GI in a few weeks.  ? ? ?  ?Subjective:  ?HPI: ? ?Patient here for hospitalization follow-up.  This is a hospital from 4/16 to 4/18.  Initially presented to the ED with weeklong dysphagia.  Admitted for dysphagia, intractable nausea vomiting, and dehydration.  He was admitted for further work-up.  Underwent EGD which showed presence Soffa just with motility disorder.  Also found to have a moderate Schatzki's ring that was dilated.  Found to have mild gastritis and duodenitis.  Biopsies were taken.  He was started on omeprazole by GI and discharged home. ? ?Since being home he has been doing well. He has been compliant with his medications without side effects.  He has advanced his diet and has been eating regular food without much difficulty.  ? ?PMH: ? ?The following were reviewed and entered/updated in epic: ?Past Medical History:  ?Diagnosis Date  ? Atrial fibrillation ([place])   ? Diverticulitis   ? Esophageal dysmotility   ? Family history of diabetes insipidus   ? FHx: colon cancer   ? H. pylori infection   ? History of colonic polyps   ? Hyperlipidemia   ? Hypertension   ? IDA (iron deficiency anemia)   ? Neoplasm of prostate   ? special screening malignant   ? Obesity   ?  Palpitation   ? Peptic ulcer disease   ? ?Patient Active Problem List  ? Diagnosis Date Noted  ? Physical deconditioning 01/16/2022  ? Dehydration 01/16/2022  ? Peptic ulcer disease   ? Fall 07/13/2020  ? Osteoarthritis 06/25/2019  ? Onychomycosis 07/29/2018  ? Esophageal dysphagia 07/29/2018  ? Peripheral edema 11/18/2015  ? Impaired glucose tolerance 04/19/2015  ? Essential hypertension 01/20/2009  ? Atrial fibrillation, chronic ([place]) 03/13/2008  ? Dyslipidemia 07/31/2007  ? History of colonic polyps 07/31/2007  ? ?Past Surgical History:  ?Procedure Laterality Date  ? 2-D Echocardiogram  November 2009, 2011  ? Anal Fistula Repair    ? BALLOON DILATION N/A 01/17/2022  ? Procedure: BALLOON DILATION;  Surgeon: Jackquline Denmark, MD;  Location: [facility];  Service: Gastroenterology;  Laterality: N/A;  ? BIOPSY  07/14/2020  ? Procedure: BIOPSY;  Surgeon: [name], MD;  Location: [facility];  Service: Gastroenterology;;  ? BIOPSY  01/17/2022  ? Procedure: BIOPSY;  Surgeon: Jackquline Denmark, MD;  Location: [facility];  Service: Gastroenterology;;  ? [place]  ? status post balloon valvuloplasty at [facility]  ? CATARACT EXTRACTION  2004  ? COLONOSCOPY  December 2005  ? ESOPHAGOGASTRODUODENOSCOPY  07/14/2020  ? ESOPHAGOGASTRODUODENOSCOPY N/A 01/17/2022  ? Procedure: ESOPHAGOGASTRODUODENOSCOPY (EGD);  Surgeon: Jackquline Denmark, MD;  Location: [facility];  Service: Gastroenterology;  Laterality: N/A;  ? ESOPHAGOGASTRODUODENOSCOPY (EGD) WITH PROPOFOL N/A 07/14/2020  ? Procedure: ESOPHAGOGASTRODUODENOSCOPY (EGD) WITH  PROPOFOL;  Surgeon: [name], MD;  Location: [facility];  Service: Gastroenterology;  Laterality: N/A;  ? ETT  1991  ? Negative  ? HEMOSTASIS CLIP PLACEMENT  07/14/2020  ? HEMOSTASIS CLIP PLACEMENT  ? HEMOSTASIS CLIP PLACEMENT  07/14/2020  ? Procedure: HEMOSTASIS CLIP PLACEMENT;  Surgeon: [name], MD;  Location: [facility];  Service: Gastroenterology;;  ? KNEE ARTHROSCOPY    ? Right   ? TONSILLECTOMY    ? ? ?Family History  ?Problem Relation Age of Onset  ? Heart attack Father   ? Colon cancer Other   ? Diabetes Other   ? Prostate cancer Brother   ? Heart disease Other   ? Coronary artery disease Other   ?     Two siblings, status post stenting to siblings with heart disease. One. Status post pacemaker insertion.  ? Stroke Brother   ? ? ?Medications- reviewed and updated ?Current Outpatient Medications  ?Medication Sig Dispense Refill  ? furosemide (LASIX) 20 MG tablet Take 1 tablet (20 mg total) by mouth daily. 90 tablet 3  ? metoprolol tartrate (LOPRESSOR) 100 MG tablet Take 1 tablet (100 mg total) by mouth 2 (two) times daily. 180 tablet 3  ? omeprazole (PRILOSEC) 40 MG capsule Take 1 capsule (40 mg total) by mouth daily. 90 capsule 1  ? polyethylene glycol powder (MIRALAX) 17 GM/SCOOP powder Take 17 g by mouth 2 (two) times daily as needed for moderate constipation. 255 g 2  ? psyllium (METAMUCIL) 58.6 % packet Take 1 packet by mouth daily.    ? ?No current facility-administered medications for this visit.  ? ? ?Allergies-reviewed and updated ?Allergies  ?Allergen Reactions  ? Protonix [Pantoprazole] Other (See Comments)  ?  Hallucinations  ? Codeine Other (See Comments)  ?  constpation  ? Ferrous Sulfate Itching and Swelling  ? ? ?Social History  ? ?Socioeconomic History  ? Marital status: Married  ?  Spouse name: Not on file  ? Number of children: Not on file  ? Years of education: Not on file  ? Highest education level: Not on file  ?Occupational History  ? Occupation: Retired  ?  Employer: RETIRED  ?Tobacco Use  ? Smoking status: Never  ? Smokeless tobacco: Never  ?Vaping Use  ? Vaping Use: Never used  ?Substance and Sexual Activity  ? Alcohol use: No  ? Drug use: Never  ? Sexual activity: Not on file  ?Other Topics Concern  ? Not on file  ?Social History Narrative  ? Regular exercise: Yes: [employer] 4 times/week  ? ?Social Determinants of Health  ? ?Financial Resource Strain: Low Risk   ?  Difficulty of Paying Living Expenses: Not hard at all  ?Food Insecurity: No Food Insecurity  ? Worried About Charity fundraiser in the Last Year: Never true  ? Ran Out of Food in the Last Year: Never true  ?Transportation Needs: No Transportation Needs  ? Lack of Transportation (Medical): No  ? Lack of Transportation (Non-Medical): No  ?Physical Activity: Insufficiently Active  ? Days of Exercise per Week: 5 days  ? Minutes of Exercise per Session: 10 min  ?Stress: No Stress Concern Present  ? Feeling of Stress : Not at all  ?Social Connections: Moderately Isolated  ? Frequency of Communication with Friends and Family: More than three times a week  ? Frequency of Social Gatherings with Friends and Family: Once a week  ? Attends Religious Services: Never  ? Active Member of Clubs or  Organizations: No  ? Attends Archivist Meetings: Never  ? Marital Status: Married  ? ? ? ?   ?  ?Objective:  ?Physical Exam: ?BP 107/75   Pulse 89   Temp (!) 97.2 ?F (36.2 ?C) (Temporal)   Ht 6' (1.829 m)   Wt 213 lb 12.8 oz (97 kg)   SpO2 97%   BMI 29.00 kg/m?   ?Gen: No acute distress, resting comfortably ?CV: Irregular rate and rhythm with no murmurs appreciated ?Pulm: Normal work of breathing, clear to auscultation bilaterally with no crackles, wheezes, or rhonchi ?Neuro: Grossly normal, moves all extremities ?Psych: Normal affect and thought content ? ?Time Spent: ?50 minutes of total time was spent on the date of the encounter performing the following actions: chart review prior to seeing the patient including recent hospitalization, obtaining history, performing a medically necessary exam, counseling on the treatment plan, placing orders, and documenting in our EHR.  ? ? ?   ? ?[name]. [name], MD ?01/23/2022 10:32 AM  ? ?

## 2022-01-23 NOTE — Patient Instructions (Signed)
It was very nice to see you today! ? ?I am glad that you are feeling better.  No medication changes today.  ? ?Take care, ?Dr [name] ? ?PLEASE NOTE: ? ?If you had any lab tests please let us know if you have not heard back within a few days. You may see your results on mychart before we have a chance to review them but we will give you a call once they are reviewed by [place]. If we ordered any referrals today, please let us know if you have not heard from their office within the next week.  ? ?Please try these tips to maintain a healthy lifestyle: ? ?Eat at least 3 REAL meals and 1-2 snacks per day.  Aim for no more than 5 hours between eating.  If you eat breakfast, please do so within one hour of getting up.  ? ?Each meal should contain half fruits/vegetables, one quarter protein, and one quarter carbs (no bigger than a computer mouse) ? ?Cut down on sweet beverages. This includes juice, soda, and sweet tea.  ? ?Drink at least 1 glass of water with each meal and aim for at least 8 glasses per day ? ?Exercise at least 150 minutes every week.   ?

## 2022-01-23 NOTE — Assessment & Plan Note (Signed)
At goal on metoprolol tartrate '100mg'$  twice daily.  ?

## 2022-01-23 NOTE — Assessment & Plan Note (Signed)
Stable. Rate controlled today on metoprolol tartrate '100mg'$  twice daily. Follows with cardiology.  ?

## 2022-01-25 ENCOUNTER — Ambulatory Visit (HOSPITAL_COMMUNITY)
Admission: RE | Admit: 2022-01-25 | Discharge: 2022-01-25 | Disposition: A | Discharge status: Home / Self Care | Payer: Medicare Other | Source: Ambulatory Visit | Attending: Gastroenterology | Admitting: Gastroenterology

## 2022-01-25 DIAGNOSIS — K224 Dyskinesia of esophagus: Secondary | ICD-10-CM | POA: Diagnosis not present

## 2022-01-25 DIAGNOSIS — R1310 Dysphagia, unspecified: Secondary | ICD-10-CM | POA: Diagnosis not present

## 2022-02-02 ENCOUNTER — Ambulatory Visit (INDEPENDENT_AMBULATORY_CARE_PROVIDER_SITE_OTHER): Payer: Medicare Other | Admitting: Cardiology

## 2022-02-02 ENCOUNTER — Encounter (HOSPITAL_BASED_OUTPATIENT_CLINIC_OR_DEPARTMENT_OTHER): Payer: Self-pay | Admitting: Cardiology

## 2022-02-02 VITALS — BP 118/74 | HR 92 | Ht 72.0 in | Wt 214.7 lb

## 2022-02-02 DIAGNOSIS — R5381 Other malaise: Secondary | ICD-10-CM

## 2022-02-02 DIAGNOSIS — R600 Localized edema: Secondary | ICD-10-CM | POA: Diagnosis not present

## 2022-02-02 DIAGNOSIS — Z09 Encounter for follow-up examination after completed treatment for conditions other than malignant neoplasm: Secondary | ICD-10-CM

## 2022-02-02 DIAGNOSIS — I10 Essential (primary) hypertension: Secondary | ICD-10-CM | POA: Diagnosis not present

## 2022-02-02 DIAGNOSIS — I4821 Permanent atrial fibrillation: Secondary | ICD-10-CM | POA: Diagnosis not present

## 2022-02-02 MED ORDER — APIXABAN 5 MG PO TABS: 5.0000 mg | ORAL_TABLET | Freq: Two times a day (BID) | ORAL | 3 refills | Status: DC

## 2022-02-02 NOTE — Progress Notes (Signed)
?Cardiology Office Note:   ? ?Date:  02/02/2022  ? ?ID:  Glen [name], DOB [birth year], MRN [id number] ? ?PCP:  [name], MD  ?Cardiologist:  Dr. Claiborne Billings ? ?CC: Post hospital follow-up ? ?History of Present Illness:   ? ?Glen [name] is a 88 y.o. male with a hx of hypertension, hyperlidemia and permanent AF, previously on coumadin but now on no anticoagulation who is seen for a post hospital visit.  ? ?At his last appointment I reviewed his 07/2020 hospitalization. He was seen for a 7 day transition of care visit. All medications reviewed, as well as review of hospital course and discharge summary. Patient's main concern was for over the counter medications for sinus congestion, allergies, pain, and sleep. He was told that he could not take anything until this was cleared by a doctor. We discussed this at length. Discussed absolute avoidance of NSAIDs. [place] for physical therapy, etc from a heart perspective. Reviewed 07/2020 hospitalization for GI bleed. ECG 07/14/20 afib RVR with PVC. Only cardiac medication at that visit was metoprolol, and heart rate was 95 bpm. I discussed changing to extended release metoprolol but he declined and would prefer to keep tartrate, as he said this was what Dr. Claiborne Billings had recommended to him. He was difficult to keep on task during the interview. He had chronic LE edema, which he endorsed is from the metoprolol. We discussed chronic venous insufficiency in depth. Could not feel his afib. ? ?Today: ?Since his last visit he was admitted to the hospital 08/09/2020 with nausea and black tarry stools. He was discharged on omeprazole 40 mg twice daily. ? ?He was last seen in cardiology by Dr. Claiborne Billings 08/08/2021 where he reported  a bleeding ulcer, and a typical heart rate in the 90s. He continued to have leg swelling R > L. It was planned to start a slight additional titration of metoprolol to tartrate to 125 mg in the morning and he would continue with the 100 mg in the evening. Also  recommended to wear compression socks. ? ?Most recently he was admitted to the hospital 01/15/2022 with worsening dysphagia for one week with associated nausea and vomiting. ? ?He is accompanied by his daughter. Overall, he states he hasn't had any problems since his most recent hospital admission, no recurring dysphagia. He remains compliant with Prilosec. ? ?It was recommended that he exercise more, but they are not sure what he should try or what he would be able to tolerate from a heart perspective. He becomes fatigued with minimal exertion, such as walking back and forth over the driveway or getting a shower. He notes that his heart medication tends to cause edema in his legs. Recently 1 tablet of Lasix daily did not seem to control his fluid, and he has now started taking it twice a day which seems to help.  We reviewed exercise alternatives at length today including aqua-therapy. ? ?They are also concerned that in the mornings he often feels tremulous, dizzy, and lightheaded. His daughter wonders if this is due to low blood sugars. He prepares his breakfast which may contain cereal with Premier Protein shakes, and scrambled eggs. After preparing breakfast he will carry it across the house to his office, and he sometimes will feel like he is going to pass out with associated tremors and lightheadedness. Typically his largest meal of the day is breakfast. At lunch he may have a sandwich or something small. Dinner is usually at 5 PM and may consist  of crackers and pimento cheese or a bowl of ice cream. Then he doesn't eat again until morning. They confirm he is losing weight on his current diet. ? ?If his heart is racing he can feel it; this is usually noticeable when he is upset. ? ?He denies any chest pain. No headaches, orthopnea, or PND. ? ? ?Past Medical History:  ?Diagnosis Date  ? Atrial fibrillation ([place])   ? Diverticulitis   ? Esophageal dysmotility   ? Family history of diabetes insipidus   ? FHx: colon  cancer   ? H. pylori infection   ? History of colonic polyps   ? Hyperlipidemia   ? Hypertension   ? IDA (iron deficiency anemia)   ? Neoplasm of prostate   ? special screening malignant   ? Obesity   ? Palpitation   ? Peptic ulcer disease   ? ? ?Past Surgical History:  ?Procedure Laterality Date  ? 2-D Echocardiogram  November 2009, 2011  ? Anal Fistula Repair    ? BALLOON DILATION N/A 01/17/2022  ? Procedure: BALLOON DILATION;  Surgeon: Jackquline Denmark, MD;  Location: [facility];  Service: Gastroenterology;  Laterality: N/A;  ? BIOPSY  07/14/2020  ? Procedure: BIOPSY;  Surgeon: [name], MD;  Location: [place];  Service: Gastroenterology;;  ? BIOPSY  01/17/2022  ? Procedure: BIOPSY;  Surgeon: Jackquline Denmark, MD;  Location: [facility];  Service: Gastroenterology;;  ? [place]  ? status post balloon valvuloplasty at [facility]  ? CATARACT EXTRACTION  2004  ? COLONOSCOPY  December 2005  ? ESOPHAGOGASTRODUODENOSCOPY  07/14/2020  ? ESOPHAGOGASTRODUODENOSCOPY N/A 01/17/2022  ? Procedure: ESOPHAGOGASTRODUODENOSCOPY (EGD);  Surgeon: Jackquline Denmark, MD;  Location: [facility];  Service: Gastroenterology;  Laterality: N/A;  ? ESOPHAGOGASTRODUODENOSCOPY (EGD) WITH PROPOFOL N/A 07/14/2020  ? Procedure: ESOPHAGOGASTRODUODENOSCOPY (EGD) WITH PROPOFOL;  Surgeon: [name], MD;  [facility];  Service: Gastroenterology;  Laterality: N/A;  ? ETT  1991  ? Negative  ? HEMOSTASIS CLIP PLACEMENT  07/14/2020  ? HEMOSTASIS CLIP PLACEMENT  ? HEMOSTASIS CLIP PLACEMENT  07/14/2020  ? Procedure: HEMOSTASIS CLIP PLACEMENT;  Surgeon: [name], MD;  Location: [facility] ENDOSCOPY;  Service: Gastroenterology;;  ? KNEE ARTHROSCOPY    ? Right  ? TONSILLECTOMY    ? ? ?Current Medications: ?Current Outpatient Medications on File Prior to Visit  ?Medication Sig  ? furosemide (LASIX) 20 MG tablet Take 1 tablet (20 mg total) by mouth daily.  ? metoprolol tartrate (LOPRESSOR) 100 MG tablet Take 1 tablet (100 mg total) by  mouth 2 (two) times daily.  ? omeprazole (PRILOSEC) 40 MG capsule Take 1 capsule (40 mg total) by mouth daily.  ? psyllium (METAMUCIL) 58.6 % packet Take 1 packet by mouth daily.  ? ?No current facility-administered medications on file prior to visit.  ? actually taking 75 mg BID metoprolol ? ?Allergies:   Protonix [pantoprazole], Codeine, and Ferrous sulfate  ? ?Social History  ? ?Tobacco Use  ? Smoking status: Never  ? Smokeless tobacco: Never  ?Vaping Use  ? Vaping Use: Never used  ?Substance Use Topics  ? Alcohol use: No  ? Drug use: Never  ? ? ?Family History: ?The patient's family history includes Colon cancer in an other family member; Coronary artery disease in an other family member; Diabetes in an other family member; Heart attack in his father; Heart disease in an other family member; Prostate cancer in his brother; Stroke in his brother. ? ?ROS:   ?Please  see the history of present illness.   ?(+) Near syncope ?(+) Tremors ?(+) Dizziness/Lightheadedness ?(+) Fatigue ?(+) Shortness of breath ?(+) LE edema ?Additional pertinent ROS otherwise unremarkable. ? ?EKGs/Labs/Other Studies Reviewed:   ? ?The following studies were reviewed today: ? ?Echo 06/01/2021: ? 1. Hypokinesis of the basal septum. Left ventricular ejection fraction,  ?by estimation, is 55 to 60%. The left ventricle has normal function. The  ?left ventricle demonstrates regional wall motion abnormalities (see  ?scoring diagram/findings for  ?description). Left ventricular diastolic parameters are indeterminate.  ? 2. Right ventricular systolic function is normal. The right ventricular  ?size is normal. There is normal pulmonary artery systolic pressure.  ? 3. Left atrial size was severely dilated.  ? 4. The mitral valve is normal in structure. No evidence of mitral valve  ?regurgitation. No evidence of mitral stenosis.  ? 5. The aortic valve is tricuspid. There is mild calcification of the  ?aortic valve. There is mild thickening of the aortic  valve. Aortic valve  ?regurgitation is trivial. No aortic stenosis is present.  ? 6. Aortic dilatation noted. 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 There is moderate dilat

## 2022-02-02 NOTE — Patient Instructions (Signed)
Medication Instructions:  ?START ELIQUIS AFTER YOU DISCUSS WITH DR PYRTLE AND HE GIVE YOU THE OK  ? ?*If you need a refill on your cardiac medications before your next appointment, please call your pharmacy* ? ?Lab Work: ?NONE ? ?Testing/Procedures: ?NONE ? ?Follow-Up: ?At [facility], you and your health needs are our priority.  As part of our continuing mission to provide you with exceptional heart care, we have created designated Provider Care Teams.  These Care Teams include your primary Cardiologist (physician) and Advanced Practice Providers (APPs -  Physician Assistants and Nurse Practitioners) who all work together to provide you with the care you need, when you need it. ? ?We recommend signing up for the patient portal called "MyChart".  Sign up information is provided on this After Visit Summary.  MyChart is used to connect with patients for Virtual Visits (Telemedicine).  Patients are able to view lab/test results, encounter notes, upcoming appointments, etc.  Non-urgent messages can be sent to your provider as well.   ?To learn more about what you can do with MyChart, go to [email or web address].   ? ?Your next appointment:   ?2 month(s) ? ?The format for your next appointment:   ?In Person ? ?Provider:   ?Buford Dresser, MD  ? ? ? ? ? ? ?

## 2022-02-03 ENCOUNTER — Telehealth: Payer: Self-pay | Admitting: *Deleted

## 2022-02-03 NOTE — Telephone Encounter (Signed)
-----  Message -----  ?From: Buford Dresser, MD  ?Sent: 02/02/2022   5:41 PM EDT  ?To: [name], MD  ? ?Hello--I recently assumed care for Mr. Volpi from Dr. Claiborne Billings. I had met Mr. Tebbetts several times before and am familiar with his history of GI bleed and his recent hospitalization. I'm concerned about his risk of stroke from his permanent afib. I would not want him back on coumadin, but they are agreeable to apixaban as an option. His H/H has been stable recently, and he denies any recent bleeding. However, with his recent EGD/dilation, I want to make sure he is ok to trial anticoagulation from a GI perspective. They are coming to see you soon, and the decision can wait until then. If you are ok with it, I have an apixaban script pending for them at the pharmacy once they talk to you. Thanks so much,  ?Bridgette Christopher  ?

## 2022-02-10 ENCOUNTER — Encounter: Payer: Self-pay | Admitting: *Deleted

## 2022-02-13 ENCOUNTER — Ambulatory Visit (INDEPENDENT_AMBULATORY_CARE_PROVIDER_SITE_OTHER): Payer: Medicare Other | Admitting: Internal Medicine

## 2022-02-13 ENCOUNTER — Telehealth: Payer: Self-pay | Admitting: Cardiology

## 2022-02-13 ENCOUNTER — Encounter: Payer: Self-pay | Admitting: Internal Medicine

## 2022-02-13 VITALS — BP 124/78 | HR 89 | Ht 72.0 in | Wt 205.0 lb

## 2022-02-13 DIAGNOSIS — K222 Esophageal obstruction: Secondary | ICD-10-CM

## 2022-02-13 DIAGNOSIS — K5909 Other constipation: Secondary | ICD-10-CM

## 2022-02-13 DIAGNOSIS — R1319 Other dysphagia: Secondary | ICD-10-CM | POA: Diagnosis not present

## 2022-02-13 DIAGNOSIS — K2289 Other specified disease of esophagus: Secondary | ICD-10-CM

## 2022-02-13 MED ORDER — OMEPRAZOLE 40 MG PO CPDR: 40.0000 mg | DELAYED_RELEASE_CAPSULE | Freq: Two times a day (BID) | ORAL | 1 refills | Status: DC

## 2022-02-13 NOTE — Patient Instructions (Signed)
You have been scheduled for an endoscopy. Please follow written instructions given to you at your visit today. ?If you use inhalers (even only as needed), please bring them with you on the day of your procedure. ? ?We have sent the following medications to your pharmacy for you to pick up at your convenience: ?Omeprazole 40 mg twice daily ? ?Please purchase the following medications over the counter and take as directed: ?Metamucil twice daily dissolved in at least 8 ounces water/juice  ? ?If you are age 86 or older, your body mass index should be between 23-30. Your Body mass index is 27.8 kg/m?Marland Kitchen If this is out of the aforementioned range listed, please consider follow up with your Primary Care Provider. ? ?If you are age 17 or younger, your body mass index should be between 19-25. Your Body mass index is 27.8 kg/m?Marland Kitchen If this is out of the aformentioned range listed, please consider follow up with your Primary Care Provider.  ? ?________________________________________________________ ? ?The [place] [facility] providers would like to encourage you to use [facility] to communicate with providers for non-urgent requests or questions.  Due to long hold times on the telephone, sending your provider a message by [facility] may be a faster and more efficient way to get a response.  Please allow 48 business hours for a response.  Please remember that this is for non-urgent requests.  ?_______________________________________________________ ? ?Due to recent changes in healthcare laws, you may see the results of your imaging and laboratory studies on MyChart before your provider has had a chance to review them.  We understand that in some cases there may be results that are confusing or concerning to you. Not all laboratory results come back in the same time frame and the provider may be waiting for multiple results in order to interpret others.  Please give [place] 48 hours in order for your provider to thoroughly review all the results  before contacting the office for clarification of your results.  ? ?

## 2022-02-13 NOTE — Telephone Encounter (Signed)
Patient called stating he got a message to start Eliquis.  He states he is not going to take it, to call [employer] and cancel the prescription.  ?

## 2022-02-13 NOTE — Telephone Encounter (Signed)
RN called pharmacy and updated them, routing to Dr. Harrell Gave and Isac Caddy as an [place] ?

## 2022-02-13 NOTE — Progress Notes (Signed)
? ?Subjective:  ? ? Patient ID: Glen [name], male    DOB: 09/12/1928, 88 y.o.   MRN: [id number] ? ?HPI ?Glen [name] is a 88 year old male with history of bleeding duodenal ulcer related to H. pylori, esophageal dysmotility and distal esophageal stricture, hypertension, hyperlipidemia, atrial fibrillation, history of diverticulitis, prior prostate cancer who is here for follow-up.  He is here today with his son.  I last saw him in December 2021 but he was recently hospitalized with recurrent dysphagia symptoms. ? ?He had an upper endoscopy performed by Dr. Lyndel Safe on 01/17/2022.  This revealed a food impaction which was cleared easily.  Schatzki's ring was found in the distal esophagus dilated to 15 mm with balloon.  It was also biopsied.  This biopsy specimen was benign.  Small hiatal hernia.  Scattered erythema in the stomach which biopsy negative for H. pylori.  There was mild duodenal stricture also biopsied and peptic in nature without dysplasia. ? ?He reports that he has continued to have issues with foods like steak and rice.  He ate steak last night and despite chewing it very well and taking a small bite it sounds like he had a transient food impaction which lasted several minutes.  He denies heartburn.  No abdominal pain.  No odynophagia.  Swallowing fine this morning and ate breakfast.  Bowel movements vary from loose to hard.  He is using Metamucil twice a day most days. ? ?He has decided not to take Eliquis.  He saw cardiology, researched this medication and determined that he does not want to take the risk of GI bleed.  He does seem to understand the risk of stroke associated with atrial fibrillation. ? ?He is taking omeprazole 40 mg once a day occasionally twice a day. ? ? ?Review of Systems ?As per HPI, otherwise negative ? ?Current Medications, Allergies, Past Medical History, Past Surgical History, Family History and Social History were reviewed in [employer] record. ?    ?Objective:  ? Physical Exam ?BP 124/78   Pulse 89   Ht 6' (1.829 m)   Wt 205 lb (93 kg)   SpO2 97%   BMI 27.80 kg/m?  ?Gen: awake, alert, NAD ?HEENT: anicteric, op clear ?CV: Irregular irregular with controlled rate ?Pulm: CTA b/l ?Abd: soft, NT/ND, +BS throughout ?Ext: no c/c/e ?Neuro: nonfocal ? ? ?ESOPHOGRAM/BARIUM SWALLOW ?  ?TECHNIQUE: ?Single contrast examination was performed using  thin barium. ?  ?FLUOROSCOPY: ?Radiation Exposure Index (as provided by the fluoroscopic device): ?31.5 mGy [place] ?  ?COMPARISON:  None. ?  ?FINDINGS: ?Limited examination was performed due to patient condition. Poor ?esophageal motility with tertiary contractions. Proximal and mid ?esophagus are dilated. There is luminal tapering of the distal ?esophagus with a stricture at the gastroesophageal junction, through ?which a 13 mm barium tablet would not pass. ?  ?IMPRESSION: ?1. Proximal/mid esophageal dilatation with tapering of the distal ?esophagus and a gastroesophageal junction stricture, through which a ?13 mm barium tablet would not pass. ?2. Esophageal dysmotility. ?  ?  ?Electronically Signed ?  By: Lorin Picket M.D. ?  On: 01/25/2022 12:10 ?  ? ?  Latest Ref Rng & Units 01/17/2022  ?  6:03 AM 01/16/2022  ?  6:04 AM 01/15/2022  ?  4:58 PM  ?CBC  ?WBC 4.0 - 10.5 K/uL 6.9   8.6   11.9    ?Hemoglobin 13.0 - 17.0 g/dL 15.4   15.2   17.3    ?Hematocrit 39.0 - 52.0 %  44.5   46.1   52.6    ?Platelets 150 - 400 K/uL 169   156   213    ? ? ? ? ?   ?Assessment & Plan:  ?88 year old male with history of bleeding duodenal ulcer related to H. pylori, esophageal dysmotility and distal esophageal stricture, hypertension, hyperlipidemia, atrial fibrillation, history of diverticulitis, prior prostate cancer who is here for follow-up.  ? ?Esophageal dysphagia due to esophageal stricture/Schatzki's ring and esophageal dysmotility/GERD --we discussed his symptoms again today.  He is having still solid food dysphagia.  He had a follow-up  barium esophagram after EGD and the 13 mm barium tablet would not pass.  We discussed dietary measures such as soft diet, chewing food well and drinking liquid between solid bites.  We also discussed repeat upper endoscopy including the risk, benefits and alternatives.  He wishes to proceed with repeat upper endoscopy.  He is not taking Eliquis or other anticoagulation and he seems definitive in this decision. ?--Continue omeprazole 40 mg once to twice daily before meals; I encouraged him to take this at minimum once daily 30 minutes before breakfast ?--Upper endoscopy in the [place] ?--He is not taking anticoagulation ? ?2.  Alternating constipation and loose stool --continue Metamucil but increase to 2 teaspoons to 2 tablespoons twice daily ? ?3.  A-fib --following with Dr. Shawna Orleans, he has decided not to take Eliquis.  He seems to understand the risk of stroke.  I asked that he notify her office this week of his decision not to take anticoagulation.  He will see her again on 04/07/2022. ? ?30 minutes total spent today including patient facing time, coordination of care, reviewing medical history/procedures/pertinent radiology studies, and documentation of the encounter. ? ? ?

## 2022-02-16 ENCOUNTER — Encounter: Payer: Self-pay | Admitting: Internal Medicine

## 2022-02-16 ENCOUNTER — Ambulatory Visit (AMBULATORY_SURGERY_CENTER): Payer: Medicare Other | Admitting: Internal Medicine

## 2022-02-16 VITALS — BP 100/76 | HR 86 | Temp 97.3°F | Resp 17 | Ht 72.0 in | Wt 205.0 lb

## 2022-02-16 DIAGNOSIS — K449 Diaphragmatic hernia without obstruction or gangrene: Secondary | ICD-10-CM | POA: Diagnosis not present

## 2022-02-16 DIAGNOSIS — R1319 Other dysphagia: Secondary | ICD-10-CM | POA: Diagnosis not present

## 2022-02-16 DIAGNOSIS — E669 Obesity, unspecified: Secondary | ICD-10-CM | POA: Diagnosis not present

## 2022-02-16 DIAGNOSIS — K2281 Esophageal polyp: Secondary | ICD-10-CM | POA: Diagnosis not present

## 2022-02-16 DIAGNOSIS — K317 Polyp of stomach and duodenum: Secondary | ICD-10-CM | POA: Diagnosis not present

## 2022-02-16 DIAGNOSIS — K222 Esophageal obstruction: Secondary | ICD-10-CM | POA: Diagnosis not present

## 2022-02-16 DIAGNOSIS — I10 Essential (primary) hypertension: Secondary | ICD-10-CM | POA: Diagnosis not present

## 2022-02-16 DIAGNOSIS — I4891 Unspecified atrial fibrillation: Secondary | ICD-10-CM | POA: Diagnosis not present

## 2022-02-16 DIAGNOSIS — R1310 Dysphagia, unspecified: Secondary | ICD-10-CM | POA: Diagnosis not present

## 2022-02-16 DIAGNOSIS — K219 Gastro-esophageal reflux disease without esophagitis: Secondary | ICD-10-CM | POA: Diagnosis not present

## 2022-02-16 MED ORDER — SODIUM CHLORIDE 0.9 % IV SOLN: 500.0000 mL | INTRAVENOUS | Status: DC

## 2022-02-16 NOTE — Progress Notes (Signed)
See office note dated 02/13/2022 for details Patient presenting for upper endoscopy with dilation for esophageal stricture He is not on anticoagulants therapy  He remains appropriate for [facility] upper endoscopy today

## 2022-02-16 NOTE — Op Note (Signed)
[place] Patient Name: Glen [name] Procedure Date: 02/16/2022 7:23 AM MRN: [id number] Endoscopist: [name] , MD Age: 88 Referring MD:  Date of Birth: Apr 15, 1928 Gender: Male Account #: [id number] Procedure:                Upper GI endoscopy Indications:              Dysphagia, For therapy of esophageal stenosis Medicines:                Monitored Anesthesia Care Procedure:                Pre-Anesthesia Assessment:                           - Prior to the procedure, a History and Physical                            was performed, and patient medications and                            allergies were reviewed. The patient's tolerance of                            previous anesthesia was also reviewed. The risks                            and benefits of the procedure and the sedation                            options and risks were discussed with the patient.                            All questions were answered, and informed consent                            was obtained. Prior Anticoagulants: The patient has                            taken no previous anticoagulant or antiplatelet                            agents. ASA Grade Assessment: III - A patient with                            severe systemic disease. After reviewing the risks                            and benefits, the patient was deemed in                            satisfactory condition to undergo the procedure.                           After obtaining informed consent, the endoscope was  passed under direct vision. Throughout the                            procedure, the patient's blood pressure, pulse, and                            oxygen saturations were monitored continuously. The                            GIF [id number] #[id number] was introduced through the                            mouth, and advanced to the second part of duodenum.                            The upper GI  endoscopy was accomplished without                            difficulty. The patient tolerated the procedure                            well. Scope In: Scope Out: Findings:                 The mid esophagus and distal esophagus were                            moderately tortuous consistent with presbyesophagus.                           One benign-appearing, intrinsic moderate                            (circumferential scarring or stenosis; an endoscope                            may pass) stenosis was found at the                            gastroesophageal junction. This stenosis measured                            1.2 cm (inner diameter) x 1 cm (in length). The                            stenosis was traversed. A TTS dilator was passed                            through the scope. Dilation with a 16-17-18 mm                            balloon dilator was performed to 17 mm. The                            dilation site was  examined and showed mild mucosal                            disruption.                           Mild mucosal changes characterized by nodularity                            were found at the gastroesophageal junction on the                            cardia side. Biopsies were taken with a cold                            forceps for histology to exclude dysplasia.                           A small hiatal hernia was present.                           The gastroesophageal flap valve was visualized                            endoscopically and classified as Hill Grade IV (no                            fold, wide open lumen, hiatal hernia present).                           Diffuse moderate inflammation characterized by                            congestion (edema) and erythema was found in the                            entire examined stomach.                           The examined duodenum was normal. Complications:            No immediate complications. Estimated  Blood Loss:     Estimated blood loss was minimal. Impression:               - Tortuous esophagus.                           - Benign-appearing esophageal stenosis. Dilated to                            17 mm with balloon.                           - Nodular mucosa at GEJ cardia side. Biopsied.                           - Small hiatal  hernia.                           - Chronic gastritis. Not biopsied as this was done                            in April 2023 and H. Pylori negative.                           - Normal examined duodenum. Recommendation:           - Soft diet (avoid tough meats).                           - Continue present medications including omeprazole                            40 mg once to twice daily.                           - Await pathology results. [name], MD 02/16/2022 8:29:19 AM This report has been signed electronically.

## 2022-02-16 NOTE — Patient Instructions (Signed)
Handout given for Hiatal Hernia and GERD.  SOFT DIET, SEE HANDOUT FOR FOODS TO AVOID. SIT UP WITH MEALS WITH HEAD UP WHEN YOU SWALLOW.   CHEW FOOD WELL, DRINK LOTS OF LIQUIDS WITH MEALS   TAKE PRILOSEC(OMEPRAZOLE) TWICE DAILY FOR NOW, 60 MINUTES BEFORE BREAKFAST AND 60 MINUTES BEFORE SUPPER.  YOU HAD AN ENDOSCOPIC PROCEDURE TODAY AT [facility]:   Refer to the procedure report that was given to you for any specific questions about what was found during the examination.  If the procedure report does not answer your questions, please call your gastroenterologist to clarify.  If you requested that your care partner not be given the details of your procedure findings, then the procedure report has been included in a sealed envelope for you to review at your convenience later.  YOU SHOULD EXPECT: Some feelings of bloating in the abdomen. Passage of more gas than usual.  Walking can help get rid of the air that was put into your GI tract during the procedure and reduce the bloating.   Please Note:  You might notice some irritation and congestion in your nose or some drainage.  This is from the oxygen used during your procedure.  There is no need for concern and it should clear up in a day or so.  SYMPTOMS TO REPORT IMMEDIATELY:  Following upper endoscopy (EGD)  Vomiting of blood or coffee ground material  New chest pain or pain under the shoulder blades  Painful or persistently difficult swallowing  New shortness of breath  Fever of 100F or higher  Black, tarry-looking stools  For urgent or emergent issues, a gastroenterologist can be reached at any hour by calling [phone]. Do not use MyChart messaging for urgent concerns.    DIET:  We do recommend a small meal at first, but then you may proceed to your regular diet.  Drink plenty of fluids but you should avoid alcoholic beverages for 24 hours.  ACTIVITY:  You should plan to take it easy for the rest of today and you  should NOT DRIVE or use heavy machinery until tomorrow (because of the sedation medicines used during the test).    FOLLOW UP: Our staff will call the number listed on your records 48-72 hours following your procedure to check on you and address any questions or concerns that you may have regarding the information given to you following your procedure. If we do not reach you, we will leave a message.  We will attempt to reach you two times.  During this call, we will ask if you have developed any symptoms of COVID 19. If you develop any symptoms (ie: fever, flu-like symptoms, shortness of breath, cough etc.) before then, please call [phone].  If you test positive for Covid 19 in the 2 weeks post procedure, please call and report this information to [place].    If any biopsies were taken you will be contacted by phone or by letter within the next 1-3 weeks.  Please call us at [phone] if you have not heard about the biopsies in 3 weeks.    SIGNATURES/CONFIDENTIALITY: You and/or your care partner have signed paperwork which will be entered into your electronic medical record.  These signatures attest to the fact that that the information above on your After Visit Summary has been reviewed and is understood.  Full responsibility of the confidentiality of this discharge information lies with you and/or your care-partner.

## 2022-02-16 NOTE — Progress Notes (Signed)
After dilation, gastric juices refluxing up esophagus.  Dr P suctioned all he saw in [place] and stomach and pulled scope out and suctioned back of mouth with yaunker.  Question on whether some fluid aspirated.  Pt was coughing and sats did drop.  We did elevate the HOB after suctioning and saw no more gastric fluid.  Immediate post op, sats still in low 90s so left pt on side and 4L O2.  BS were clear

## 2022-02-16 NOTE — Progress Notes (Signed)
Called to room to assist during endoscopic procedure.  Patient ID and intended procedure confirmed with present staff. Received instructions for my participation in the procedure from the performing physician.  

## 2022-02-17 ENCOUNTER — Telehealth: Payer: Self-pay

## 2022-02-17 NOTE — Telephone Encounter (Signed)
  Follow up Call-     02/16/2022    7:19 AM 09/17/2020   10:54 AM  Call back number  Post procedure Call Back phone  # [phone]  Permission to leave phone message Yes Yes     Patient questions:  Do you have a fever, pain , or abdominal swelling? No. Pain Score  0 *  Have you tolerated food without any problems? Yes.    Have you been able to return to your normal activities? Yes.    Do you have any questions about your discharge instructions: Diet   No. Medications  No. Follow up visit  No.  Do you have questions or concerns about your Care? No.  Actions: * If pain score is 4 or above: No action needed, pain <4.

## 2022-02-21 ENCOUNTER — Encounter: Payer: Self-pay | Admitting: Internal Medicine

## 2022-03-17 ENCOUNTER — Telehealth: Payer: Self-pay | Admitting: Internal Medicine

## 2022-03-17 NOTE — Telephone Encounter (Signed)
Discussed with daughter that pt was to follow a soft diet and avoid tough meats. Will mail a copy of a soft diet to pt and include one for daughter also.

## 2022-03-17 NOTE — Telephone Encounter (Signed)
Clayborne Dana, patient's daughter, called regarding patient's post-esophageal diet he was given.  Is that diet something he will have to eat for the rest of his life, or was it just after the procedure?  She says he will not eat anything that is on that paper; I.E. if it has seeds, he won't eat it--even apples.  Please call and advise.  Thank you.

## 2022-04-05 ENCOUNTER — Encounter (HOSPITAL_BASED_OUTPATIENT_CLINIC_OR_DEPARTMENT_OTHER): Payer: Self-pay | Admitting: Physical Therapy

## 2022-04-05 ENCOUNTER — Ambulatory Visit (HOSPITAL_BASED_OUTPATIENT_CLINIC_OR_DEPARTMENT_OTHER): Payer: Medicare Other | Attending: Cardiology | Admitting: Physical Therapy

## 2022-04-05 DIAGNOSIS — R2681 Unsteadiness on feet: Secondary | ICD-10-CM | POA: Insufficient documentation

## 2022-04-05 DIAGNOSIS — M6281 Muscle weakness (generalized): Secondary | ICD-10-CM | POA: Diagnosis not present

## 2022-04-05 DIAGNOSIS — R2689 Other abnormalities of gait and mobility: Secondary | ICD-10-CM | POA: Insufficient documentation

## 2022-04-05 DIAGNOSIS — R5381 Other malaise: Secondary | ICD-10-CM | POA: Diagnosis not present

## 2022-04-05 NOTE — Therapy (Signed)
OUTPATIENT PHYSICAL THERAPY LOWER EXTREMITY EVALUATION   Patient Name: Glen [name] MRN: [id number] DOB:[birth year], 88 y.o., male Today's Date: 04/05/2022   PT End of Session - 04/05/22 1616     Visit Number 1    Number of Visits 17    Date for PT Re-Evaluation 05/31/22    Authorization Time Period 04/05/22 to 05/31/22    Progress Note Due on Visit 10    PT Start Time 1518    PT Stop Time 1556    PT Time Calculation (min) 38 min    Activity Tolerance Patient tolerated treatment well    Behavior During Therapy [facility] for tasks assessed/performed             Past Medical History:  Diagnosis Date   Atrial fibrillation ([place])    Diverticulitis    Esophageal dysmotility    Family history of diabetes insipidus    FHx: colon cancer    H. pylori infection    Hiatal hernia    History of colonic polyps    Hyperlipidemia    Hypertension    IDA (iron deficiency anemia)    Neoplasm of prostate    special screening malignant    Obesity    Palpitation    Peptic ulcer disease    Presbyesophagus    Schatzki's ring    Past Surgical History:  Procedure Laterality Date   2-D Echocardiogram  November 2009, 2011   Anal Fistula Repair     BALLOON DILATION N/A 01/17/2022   Procedure: [place];  Surgeon: Jackquline Denmark, MD;  Location: [facility];  Service: Gastroenterology;  Laterality: N/A;   BIOPSY  07/14/2020   Procedure: BIOPSY;  Surgeon: [name], MD;  Location: [facility];  Service: Gastroenterology;;   BIOPSY  01/17/2022   Procedure: BIOPSY;  Surgeon: Jackquline Denmark, MD;  Location: [facility];  Service: Gastroenterology;;   CARDIAC VALVE SURGERY  1996   status post balloon valvuloplasty at [place]  2004   COLONOSCOPY  December 2005   ESOPHAGOGASTRODUODENOSCOPY  07/14/2020   ESOPHAGOGASTRODUODENOSCOPY N/A 01/17/2022   Procedure: ESOPHAGOGASTRODUODENOSCOPY (EGD);  Surgeon: Jackquline Denmark, MD;  Location: [facility];  Service: Gastroenterology;   Laterality: N/A;   ESOPHAGOGASTRODUODENOSCOPY (EGD) WITH PROPOFOL N/A 07/14/2020   Procedure: ESOPHAGOGASTRODUODENOSCOPY (EGD) WITH PROPOFOL;  Surgeon: [name], MD;  Location: [facility] END[facility]COPY;  Service: Gastroenterology;  Laterality: N/A;   ETT  1991   Negative   HEMOSTASIS CLIP PLACEMENT  07/14/2020   HEMOSTASIS CLIP PLACEMENT   HEMOSTASIS CLIP PLACEMENT  07/14/2020   Procedure: HEMOSTASIS CLIP PLACEMENT;  Surgeon: [name], MD;  Location: [facility];  Service: Gastroenterology;;   KNEE ARTHROSCOPY     Right   TONSILLECTOMY     Patient Active Problem List   Diagnosis Date Noted   Physical deconditioning 01/16/2022   Dehydration 01/16/2022   Peptic ulcer disease    Fall 07/13/2020   Osteoarthritis 06/25/2019   Onychomycosis 07/29/2018   Esophageal dysphagia 07/29/2018   Peripheral edema 11/18/2015   Impaired glucose tolerance 04/19/2015   Essential hypertension 01/20/2009   Atrial fibrillation, chronic ([place]) 03/13/2008   Dyslipidemia 07/31/2007   History of colonic polyps 07/31/2007    PCP: Dimas Chyle   REFERRING PROVIDER: Buford Dresser, MD   REFERRING DIAG: R53.81 (ICD-10-CM) - Physical deconditioning   THERAPY DIAG:  Muscle weakness (generalized) - Plan: PT plan of care cert/re-cert  Unsteadiness on feet - Plan: PT plan of care cert/re-cert  Other abnormalities of gait  and mobility - Plan: PT plan of care cert/re-cert  Rationale for Evaluation and Treatment Rehabilitation  ONSET DATE: 02/02/2022   SUBJECTIVE:   SUBJECTIVE STATEMENT: Its hard for me to walk without the walker, I rely too much on this. I use the walker to get around the house. No falls in the past couple years. Sometimes after I walk to the office at home I feel a little dizzy but it clears up. Dependent on the walker per family, trying to build up endurance per family. Per family, has some knee pain from time to time.   PERTINENT HISTORY: PMH: HTN, Afib, OA, HLD, hernia,  IDA, neoplasm of prostate, cardiac valve surgery, knee arthroscopy  Glen [name] is a 88 y.o. male with a hx of hypertension, hyperlidemia and permanent AF, previously on coumadin but now on no anticoagulation who is seen for a post hospital visit.    At his last appointment I reviewed his 07/2020 hospitalization. He was seen for a 7 day transition of care visit. All medications reviewed, as well as review of hospital course and discharge summary. Patient's main concern was for over the counter medications for sinus congestion, allergies, pain, and sleep. He was told that he could not take anything until this was cleared by a doctor. We discussed this at length. Discussed absolute avoidance of NSAIDs. [place] for physical therapy, etc from a heart perspective. Reviewed 07/2020 hospitalization for GI bleed. ECG 07/14/20 afib RVR with PVC. Only cardiac medication at that visit was metoprolol, and heart rate was 95 bpm. I discussed changing to extended release metoprolol but he declined and would prefer to keep tartrate, as he said this was what Dr. Claiborne Billings had recommended to him. He was difficult to keep on task during the interview. He had chronic LE edema, which he endorsed is from the metoprolol. We discussed chronic venous insufficiency in depth. Could not feel his afib.   Today: Since his last visit he was admitted to the hospital 08/09/2020 with nausea and black tarry stools. He was discharged on omeprazole 40 mg twice daily.   He was last seen in cardiology by Dr. Claiborne Billings 08/08/2021 where he reported  a bleeding ulcer, and a typical heart rate in the 90s. He continued to have leg swelling R > L. It was planned to start a slight additional titration of metoprolol to tartrate to 125 mg in the morning and he would continue with the 100 mg in the evening. Also recommended to wear compression socks.   Most recently he was admitted to the hospital 01/15/2022 with worsening dysphagia for one week with associated  nausea and vomiting.   He is accompanied by his daughter. Overall, he states he hasn't had any problems since his most recent hospital admission, no recurring dysphagia. He remains compliant with Prilosec.   It was recommended that he exercise more, but they are not sure what he should try or what he would be able to tolerate from a heart perspective. He becomes fatigued with minimal exertion, such as walking back and forth over the driveway or getting a shower. He notes that his heart medication tends to cause edema in his legs. Recently 1 tablet of Lasix daily did not seem to control his fluid, and he has now started taking it twice a day which seems to help.  We reviewed exercise alternatives at length today including aqua-therapy.   They are also concerned that in the mornings he often feels tremulous, dizzy, and lightheaded. His daughter  wonders if this is due to low blood sugars. He prepares his breakfast which may contain cereal with Premier Protein shakes, and scrambled eggs. After preparing breakfast he will carry it across the house to his office, and he sometimes will feel like he is going to pass out with associated tremors and lightheadedness. Typically his largest meal of the day is breakfast. At lunch he may have a sandwich or something small. Dinner is usually at 5 PM and may consist of crackers and pimento cheese or a bowl of ice cream. Then he doesn't eat again until morning. They confirm he is losing weight on his current diet.      PAIN:  Are you having pain? No  PRECAUTIONS: Fall  WEIGHT BEARING RESTRICTIONS No  FALLS:  Has patient fallen in last 6 months? No  LIVING ENVIRONMENT: Lives with: lives with their spouse but son is 10 minutes away  Lives in: House/apartment Stairs: Yes: Internal: 4 steps; can reach both does not have to go upstairs inside the home  Has following equipment at home: Gilford Rile - 2 wheeled and [employer] - 4 wheeled, shower chair, grab bars    OCCUPATION: retired   PLOF: Independent, Independent with basic ADLs, Independent with household mobility with device, and Requires assistive device for independence  PATIENT GOALS build endurance    OBJECTIVE:   DIAGNOSTIC FINDINGS:   PATIENT SURVEYS:    COGNITION:  Overall cognitive status:  very tangential and hard to keep on track          POSTURE: rounded shoulders, forward head, decreased lumbar lordosis, increased thoracic kyphosis, and flexed trunk     LOWER EXTREMITY MMT:  MMT Right eval Left eval  Hip flexion 3+ 3+  Hip extension    Hip abduction 3 (sitting) 3 (sitting)  Hip adduction    Hip internal rotation    Hip external rotation    Knee flexion 4 4  Knee extension 4+ 4+  Ankle dorsiflexion 4- 4-  Ankle plantarflexion    Ankle inversion    Ankle eversion     (Blank rows = not tested)   FUNCTIONAL TESTS:  5 times sit to stand: 32 seconds had to use BUEs, very poor eccentric control and often "plopped" onto the table  3 minute walk test: 32f rollator no rest breaks  Tandem stance: unable to hold longer than 9 seconds without BUE support or assist from PT   GAIT: Distance walked: [id number] device utilized: WaEnvironmental consultant 4 wheeled Level of assistance: SBA Comments: flexed trunk, tends to push rollator a bit far ahead of himself, slow pace, really easily fatigued     TODAY'S TREATMENT:    PATIENT EDUCATION:  Education details: exam findings, POC, HEP, education on general process of water therapy/transition to land  Person educated: Patient and Child(ren) Education method: Explanation Education comprehension: verbalized understanding and needs further education   HOME EXERCISE PROGRAM: Will be assigned next session   ASSESSMENT:  CLINICAL IMPRESSION: Patient is a [age].o. male  who was seen today for physical therapy evaluation and treatment for physical deconditioning. He has had a lot of health issues in recent months, and is  not very active at home- had been going to a gym but stopped when the pandemic started. Son was present and encouraged patient throughout session/helped to clarify what is difficult/not difficult right now and their personal goals. Exam reveals significant functional muscle weakness, impaired functional mobility and general safety awareness, poor balance skills, and poor functional  activity tolerance. Will benefit from skilled PT services to address functional limitations, reduce fall risk, and optimize overall level of function.   OBJECTIVE IMPAIRMENTS Abnormal gait, cardiopulmonary status limiting activity, decreased activity tolerance, decreased balance, decreased coordination, decreased endurance, decreased knowledge of use of DME, decreased mobility, difficulty walking, decreased strength, decreased safety awareness, increased fascial restrictions, postural dysfunction, and obesity.   ACTIVITY LIMITATIONS carrying, lifting, bending, standing, squatting, stairs, transfers, bed mobility, bathing, locomotion level, and caring for others  PARTICIPATION LIMITATIONS: cleaning, shopping, and community activity  PERSONAL FACTORS Age, Behavior pattern, Education, Fitness, Social background, and Time since onset of injury/illness/exacerbation are also affecting patient's functional outcome.   REHAB POTENTIAL: Good  CLINICAL DECISION MAKING: Stable/uncomplicated  EVALUATION COMPLEXITY: Low   GOALS: Goals reviewed with patient? Yes  SHORT TERM GOALS: Target date: 05/03/2022  Will be compliant with appropriate HEP  Baseline: Goal status: INITIAL  2.  Will be able to name 3 ways to reduce fall risk at home and in the community  Baseline:  Goal status: INITIAL  3.  Will be able to complete 5x sit to stand test in no more than 20 seconds  Baseline:  Goal status: INITIAL  4.  Will be able to complete all functional transfers safely with good hand placement, general sequencing, and safety  awareness  Baseline:  Goal status: INITIAL   LONG TERM GOALS: Target date: 05/31/2022   MMT to improve by at least one grade in all weak groups  Baseline:  Goal status: INITIAL  2.  Will score at least 48 on the Berg to show reduced fall risk  Baseline:  Goal status: INITIAL  3.  Will be able to ambulate at least 543f on 3MWT with LRAD to show improved functional activity tolerance  Baseline:  Goal status: INITIAL  4.  Will be compliant with regular progressive walking program at home  Baseline:  Goal status: INITIAL  5.  Will be compliant with gym based vs advanced HEP at DC to maintain functional gains/prevent recurrence of deconditioning  Baseline:  Goal status: INITIAL    PLAN: PT FREQUENCY: 2x/week  PT DURATION: 8 weeks  PLANNED INTERVENTIONS: Therapeutic exercises, Therapeutic activity, Neuromuscular re-education, Balance training, Gait training, Patient/Family education, Joint mobilization, Stair training, DME instructions, Aquatic Therapy, Cognitive remediation, Cryotherapy, Moist heat, Taping, Ultrasound, Ionotophoresis '4mg'$ /ml Dexamethasone, Manual therapy, and Re-evaluation  PLAN FOR NEXT SESSION: still needs Berg; work on strength, balance, functional activity tolerance. First 4 weeks of POC in water, second 4 weeks we will transition to land therapy    Jarrah Babich U PT DPT PN2  04/05/2022, 4:19 PM

## 2022-04-07 ENCOUNTER — Encounter (HOSPITAL_BASED_OUTPATIENT_CLINIC_OR_DEPARTMENT_OTHER): Payer: Self-pay | Admitting: Cardiology

## 2022-04-07 ENCOUNTER — Ambulatory Visit (INDEPENDENT_AMBULATORY_CARE_PROVIDER_SITE_OTHER): Payer: Medicare Other | Admitting: Cardiology

## 2022-04-07 VITALS — BP 116/76 | HR 91 | Ht 72.0 in | Wt 208.2 lb

## 2022-04-07 DIAGNOSIS — D6869 Other thrombophilia: Secondary | ICD-10-CM | POA: Diagnosis not present

## 2022-04-07 DIAGNOSIS — Z7189 Other specified counseling: Secondary | ICD-10-CM | POA: Diagnosis not present

## 2022-04-07 DIAGNOSIS — I4821 Permanent atrial fibrillation: Secondary | ICD-10-CM | POA: Diagnosis not present

## 2022-04-07 DIAGNOSIS — I10 Essential (primary) hypertension: Secondary | ICD-10-CM | POA: Diagnosis not present

## 2022-04-07 NOTE — Patient Instructions (Signed)

## 2022-04-07 NOTE — Progress Notes (Signed)
Cardiology Office Note:    Date:  04/07/2022   ID:  [name], DOB May 24, 1928, MRN [id number]  PCP:  [name], MD  Cardiologist:  Dr. Claiborne Billings  CC: follow-up  History of Present Illness:    Glen [name] is a 88 y.o. male with a hx of hypertension, hyperlidemia and permanent AF, previously on coumadin but now on no anticoagulation who is seen for follow up.   Pertinent history: 07/2020 hospitalization for GI bleed. ECG 07/14/20 afib RVR with PVC. Only cardiac medication at that visit was metoprolol, and heart rate was 95 bpm. I discussed changing to extended release metoprolol but he declined and would prefer to keep tartrate, as he said this was what Dr. Claiborne Billings had recommended to him. He was difficult to keep on task during the interview. He had chronic LE edema, which he endorsed is from the metoprolol. We discussed chronic venous insufficiency in depth. Could not feel his afib.  He had been admitted to the hospital 08/09/2020 with nausea and black tarry stools. He was discharged on omeprazole 40 mg twice daily. He was last seen in cardiology by Dr. Claiborne Billings 08/08/2021 where he reported  a bleeding ulcer, and a typical heart rate in the 90s. He continued to have leg swelling R > L. It was planned to start a slight additional titration of metoprolol to tartrate to 125 mg in the morning and he would continue with the 100 mg in the evening. Also recommended to wear compression socks. Most recently he was admitted to the hospital 01/15/2022 with worsening dysphagia for one week with associated nausea and vomiting.  At his last appointment, he didn't have any problems since his most recent hospital admission, no recurring dysphagia. He had problems with feeling his heart racing, which would be noticeable when he was upset. He remained compliant with Prilosec. Minimal exertion would cause fatigue and his heart medication would cause edema in his legs. He increased Lasix to 2 tablets daily to  control the fluid. He was also reviewing aqua-therapy. He was concerned with tremors, dizziness, and lightheadedness. On his diet, he was undergoing weight loss.  Today, he is accompanied by his daughter.  Every once in a while he gets a headache with significant radiating pain from the top of his head to his back, and then to the soles of his feet. These headaches are occurring 2-3 times a week, but not in the last day or two. One day he felt dizzy after sitting down and had a headache.   He is experiencing swelling in his right leg which he states is due to a prior knee surgery. Usually his swelling is worse in his RLE, more than in his left. He takes Lasix everyday but sometimes takes it twice if the swelling is worse.   He is starting aqua-therapy soon and plans to begin physical therapy afterwards.   His father had a faulty heart valve and died at 86 yo due to a fall.   He denies any palpitations, chest pain, shortness of breath. No syncope, orthopnea, or PND. He endorses easy bruising and bleeding.     Past Medical History:  Diagnosis Date   Atrial fibrillation ([place])    Diverticulitis    Esophageal dysmotility    Family history of diabetes insipidus    FHx: colon cancer    H. pylori infection    Hiatal hernia    History of colonic polyps    Hyperlipidemia    Hypertension  IDA (iron deficiency anemia)    Neoplasm of prostate    special screening malignant    Obesity    Palpitation    Peptic ulcer disease    Presbyesophagus    Schatzki's ring     Past Surgical History:  Procedure Laterality Date   2-D Echocardiogram  November 2009, 2011   Anal Fistula Repair     BALLOON DILATION N/A 01/17/2022   Procedure: Larrie Kass DILATION;  Surgeon: Jackquline Denmark, MD;  Location: [facility] ENDOSCOPY;  Service: Gastroenterology;  Laterality: N/A;   BIOPSY  07/14/2020   Procedure: BIOPSY;  Surgeon: [name], MD;  Location[facility] ENDOSCOPY;  Service: Gastroenterology;;   BIOPSY  01/17/2022    Procedure: BIOPSY;  Surgeon: Jackquline Denmark, MD;  Location: [facility] ENDOSCOPY;  Service: Gastroenterology;;   CARDIAC VALVE SURGERY  1996   status post balloon valvuloplasty at [facility]  2004   COLONOSCOPY  December 2005   ESOPHAGOGASTRODUODENOSCOPY  07/14/2020   ESOPHAGOGASTRODUODENOSCOPY N/A 01/17/2022   Procedure: ESOPHAGOGASTRODUODENOSCOPY (EGD);  Surgeon: Jackquline Denmark, MD;  Location: [facility];  Service: Gastroenterology;  Laterality: N/A;   ESOPHAGOGASTRODUODENOSCOPY (EGD) WITH PROPOFOL N/A 07/14/2020   Procedure: ESOPHAGOGASTRODUODENOSCOPY (EGD) WITH PROPOFOL;  Surgeon: [name], MD;  Location: [facility] ENDOSCOPY;  Service: Gastroenterology;  Laterality: N/A;   ETT  1991   Negative   HEMOSTASIS CLIP PLACEMENT  07/14/2020   HEMOSTASIS CLIP PLACEMENT   HEMOSTASIS CLIP PLACEMENT  07/14/2020   Procedure: HEMOSTASIS CLIP PLACEMENT;  Surgeon: [name], MD;  Location: [facility] ENDOSCOPY;  Service: Gastroenterology;;   KNEE ARTHROSCOPY     Right   TONSILLECTOMY      Current Medications: Current Outpatient Medications on File Prior to Visit  Medication Sig   furosemide (LASIX) 20 MG tablet Take 1 tablet (20 mg total) by mouth daily.   metoprolol tartrate (LOPRESSOR) 100 MG tablet Take 1 tablet (100 mg total) by mouth 2 (two) times daily.   psyllium (METAMUCIL) 58.6 % packet Take 2 packets by mouth daily.   No current facility-administered medications on file prior to visit.   actually taking 75 mg BID metoprolol  Allergies:   Protonix [pantoprazole], Codeine, and Ferrous sulfate   Social History   Tobacco Use   Smoking status: Never   Smokeless tobacco: Never  Vaping Use   Vaping Use: Never used  Substance Use Topics   Alcohol use: No   Drug use: Never    Family History: The patient's family history includes Colon cancer in an other family member; Coronary artery disease in an other family member; Diabetes in an other family member; Heart attack in his  father; Heart disease in an other family member; Prostate cancer in his brother; Stroke in his brother. There is no history of Esophageal cancer.  ROS:   Please see the history of present illness.   (+) Lightheadedness (+) Headache (+) Bilateral edema R>L (+) Easy bruising/bleeding Additional pertinent ROS otherwise unremarkable.  EKGs/Labs/Other Studies Reviewed:    The following studies were reviewed today:   Echo 06/01/2021:  1. Hypokinesis of the basal septum. Left ventricular ejection fraction,  by estimation, is 55 to 60%. The left ventricle has normal function. The  left ventricle demonstrates regional wall motion abnormalities (see  scoring diagram/findings for  description). Left ventricular diastolic parameters are indeterminate.   2. Right ventricular systolic function is normal. The right ventricular  size is normal. There is normal pulmonary artery systolic pressure.   3. Left atrial size was  severely dilated.   4. The mitral valve is normal in structure. No evidence of mitral valve  regurgitation. No evidence of mitral stenosis.   5. The aortic valve is tricuspid. There is mild calcification of the  aortic valve. There is mild thickening of the aortic valve. Aortic valve  regurgitation is trivial. No aortic stenosis is present.   6. Aortic dilatation noted. There is moderate dilatation of the aortic  root, measuring 47 mm. There is moderate dilatation of the ascending  aorta, measuring 45 mm.   7. The inferior vena cava is normal in size with greater than 50%  respiratory variability, suggesting right atrial pressure of 3 mmHg.   Echo from 2016: Left ventricle: The cavity size was normal. There was mild   concentric hypertrophy. Systolic function was normal. The   estimated ejection fraction was in the range of 55% to 60%. Wall   motion was normal; there were no regional wall motion   abnormalities. - Aortic valve: There was trivial regurgitation. - Left atrium:  The atrium was moderately dilated.  EKG:  ECG is personally reviewed.  04/07/2022: not ordered 02/02/2022: not performed 08/08/2021 (Dr. Claiborne Billings): Atrial fibrillation at 96 bpm. 07/20/2020: atrial fibrillation at a rate of 95 bpm.  Recent Labs: 12/26/2021: TSH 2.83 01/17/2022: ALT 18; BUN 15; Creatinine, Ser 0.85; Hemoglobin 15.4; Magnesium 1.9; Platelets 169; Potassium 3.7; Sodium 139   Recent Lipid Panel    Component Value Date/Time   CHOL 132 12/26/2021 0918   CHOL 197 09/11/2018 1045   TRIG 106.0 12/26/2021 0918   TRIG 150 (H) 09/06/2006 1031   HDL 36.80 (L) 12/26/2021 0918   HDL 39 (L) 09/11/2018 1045   CHOLHDL 4 12/26/2021 0918   VLDL 21.2 12/26/2021 0918   LDLCALC 74 12/26/2021 0918   LDLCALC 126 (H) 09/11/2018 1045   LDLDIRECT 126.0 04/19/2015 1000    Physical Exam:    VS:  BP 116/76 (BP Location: Right Arm, Patient Position: Sitting, Cuff Size: Normal)   Pulse 91   Ht 6' (1.829 m)   Wt 208 lb 3.2 oz (94.4 kg)   SpO2 96%   BMI 28.24 kg/m     Wt Readings from Last 3 Encounters:  04/07/22 208 lb 3.2 oz (94.4 kg)  02/16/22 205 lb (93 kg)  02/13/22 205 lb (93 kg)    GEN: Well nourished, well developed in no acute distress HEENT: Normal, moist mucous membranes NECK: No JVD CARDIAC: irregularly irregular rhythm, normal S1 and S2, no rubs or gallops. No murmur. VASCULAR: Radial pulses 2+ bilaterally. No carotid bruits RESPIRATORY:  Clear to auscultation without rales, wheezing or rhonchi  ABDOMEN: Soft, non-tender, non-distended MUSCULOSKELETAL:  Moves all 4 limbs independently, ambulates with rollator SKIN: Warm and dry. Mild bilateral nonpitting edema  NEUROLOGIC:  Alert and oriented x 3. No focal neuro deficits noted. PSYCHIATRIC:  Normal affect   ASSESSMENT:    1. Permanent atrial fibrillation ([place])   2. Secondary hypercoagulable state ([place])   3. Essential hypertension   4. Encounter for anticoagulation discussion and counseling    PLAN:    Physical  deconditioning Weight loss -pending physical therapy  LE edema: -mild today, stable  History of food impaction/dysphagia, s/p esophageal dilation -doing better, denies melena/hematochezia, but EGD did show some gastritis -we discussed anticoagulation at length today (see below). He has also discussed with Dr. Hilarie Fredrickson.  Atrial fibrillation, permanent -H/H has been stable, no recent bleeding This patients CHA2DS2-VASc Score and unadjusted Ischemic Stroke Rate (% per year)  is equal to 4.8 % stroke rate/year from a score of 4 -rate controlled with metoprolol tartrate 100 mg BID -we have discussed anticoagulation at length. He has spent time doing research and does not want anticoagulation at this time. I recommended apixaban over coumadin, but he is concerned about potential for recurrent GI bleed. We discussed risk of stroke vs. Bleed at length. He understands risk and elects not to restart anticoagulation  Hypertension: -well controlled on metoprolol alone today  Follow up: 6 months or sooner as needed.  Buford Dresser, MD, PhD, [facility]    Medication Adjustments/Labs and Tests Ordered: Current medicines are reviewed at length with the patient today.  Concerns regarding medicines are outlined above.   No orders of the defined types were placed in this encounter.  No orders of the defined types were placed in this encounter.  Patient Instructions  Medication Instructions:  Your Physician recommend you continue on your current medication as directed.    *If you need a refill on your cardiac medications before your next appointment, please call your pharmacy*   Lab Work: None ordered today   Testing/Procedures: None ordered today   Follow-Up: At [facility], you and your health needs are our priority.  As part of our continuing mission to provide you with exceptional heart care, we have created designated Provider Care Teams.  These Care  Teams include your primary Cardiologist (physician) and Advanced Practice Providers (APPs -  Physician Assistants and Nurse Practitioners) who all work together to provide you with the care you need, when you need it.  We recommend signing up for the patient portal called "MyChart".  Sign up information is provided on this After Visit Summary.  MyChart is used to connect with patients for Virtual Visits (Telemedicine).  Patients are able to view lab/test results, encounter notes, upcoming appointments, etc.  Non-urgent messages can be sent to your provider as well.   To learn more about what you can do with MyChart, go to [email or web address].    Your next appointment:   6 month(s)  The format for your next appointment:   In Person  Provider:   Buford Dresser, MD{          I,Jenifer Velasquez,acting as a scribe for Buford Dresser, MD.,have documented all relevant documentation on the behalf of Buford Dresser, MD,as directed by  Buford Dresser, MD while in the presence of Buford Dresser, MD.  I, Buford Dresser, MD, have reviewed all documentation for this visit. The documentation on 04/07/22 for the exam, diagnosis, procedures, and orders are all accurate and complete.   Signed, Buford Dresser, MD PhD 04/07/2022 7:25 PM    [facility]

## 2022-04-11 ENCOUNTER — Ambulatory Visit (HOSPITAL_BASED_OUTPATIENT_CLINIC_OR_DEPARTMENT_OTHER): Payer: Medicare Other | Admitting: Physical Therapy

## 2022-04-11 ENCOUNTER — Encounter (HOSPITAL_BASED_OUTPATIENT_CLINIC_OR_DEPARTMENT_OTHER): Payer: Self-pay | Admitting: Physical Therapy

## 2022-04-11 ENCOUNTER — Ambulatory Visit (HOSPITAL_BASED_OUTPATIENT_CLINIC_OR_DEPARTMENT_OTHER): Payer: Self-pay | Admitting: Physical Therapy

## 2022-04-11 DIAGNOSIS — R5381 Other malaise: Secondary | ICD-10-CM | POA: Diagnosis not present

## 2022-04-11 DIAGNOSIS — M6281 Muscle weakness (generalized): Secondary | ICD-10-CM | POA: Diagnosis not present

## 2022-04-11 DIAGNOSIS — R2689 Other abnormalities of gait and mobility: Secondary | ICD-10-CM | POA: Diagnosis not present

## 2022-04-11 DIAGNOSIS — R2681 Unsteadiness on feet: Secondary | ICD-10-CM

## 2022-04-11 NOTE — Therapy (Signed)
OUTPATIENT PHYSICAL THERAPY LOWER EXTREMITY EVALUATION   Patient Name: Glen [name] MRN: [id number] DOB:29-Sep-1928, 88 y.o., male Today's Date: 04/11/2022   PT End of Session - 04/11/22 1033     Visit Number 2    Number of Visits 17    Date for PT Re-Evaluation 05/31/22    Authorization Time Period 04/05/22 to 05/31/22    Progress Note Due on Visit 10    PT Start Time 1033    PT Stop Time 1115    PT Time Calculation (min) 42 min             Past Medical History:  Diagnosis Date   Atrial fibrillation ([place])    Diverticulitis    Esophageal dysmotility    Family history of diabetes insipidus    FHx: colon cancer    H. pylori infection    Hiatal hernia    History of colonic polyps    Hyperlipidemia    Hypertension    IDA (iron deficiency anemia)    Neoplasm of prostate    special screening malignant    Obesity    Palpitation    Peptic ulcer disease    Presbyesophagus    Schatzki's ring    Past Surgical History:  Procedure Laterality Date   2-D Echocardiogram  November 2009, 2011   Anal Fistula Repair     BALLOON DILATION N/A 01/17/2022   Procedure: [place];  Surgeon: Jackquline Denmark, MD;  Location: [facility];  Service: Gastroenterology;  Laterality: N/A;   BIOPSY  07/14/2020   Procedure: BIOPSY;  Surgeon: [name], MD;  Location: [facility] ENDOSCOPY;  Service: Gastroenterology;;   BIOPSY  01/17/2022   Procedure: BIOPSY;  Surgeon: Jackquline Denmark, MD;  Location: [facility] ENDOSCOPY;  Service: Gastroenterology;;   CARDIAC VALVE SURGERY  1996   status post balloon valvuloplasty at [place]  2004   COLONOSCOPY  December 2005   ESOPHAGOGASTRODUODENOSCOPY  07/14/2020   ESOPHAGOGASTRODUODENOSCOPY N/A 01/17/2022   Procedure: ESOPHAGOGASTRODUODENOSCOPY (EGD);  Surgeon: Jackquline Denmark, MD;  Location: [facility];  Service: Gastroenterology;  Laterality: N/A;   ESOPHAGOGASTRODUODENOSCOPY (EGD) WITH PROPOFOL N/A 07/14/2020   Procedure:  ESOPHAGOGASTRODUODENOSCOPY (EGD) WITH PROPOFOL;  Surgeon: [name], MD;  Location: [facility] ENDOSCOPY;  Service: Gastroenterology;  Laterality: N/A;   ETT  1991   Negative   HEMOSTASIS CLIP PLACEMENT  07/14/2020   HEMOSTASIS CLIP PLACEMENT   HEMOSTASIS CLIP PLACEMENT  07/14/2020   Procedure: HEMOSTASIS CLIP PLACEMENT;  Surgeon: [name], MD;  Location: [facility] ENDOSCOPY;  Service: Gastroenterology;;   KNEE ARTHROSCOPY     Right   TONSILLECTOMY     Patient Active Problem List   Diagnosis Date Noted   Physical deconditioning 01/16/2022   Dehydration 01/16/2022   Peptic ulcer disease    Fall 07/13/2020   Osteoarthritis 06/25/2019   Onychomycosis 07/29/2018   Esophageal dysphagia 07/29/2018   Peripheral edema 11/18/2015   Impaired glucose tolerance 04/19/2015   Essential hypertension 01/20/2009   Atrial fibrillation, chronic ([place]) 03/13/2008   Dyslipidemia 07/31/2007   History of colonic polyps 07/31/2007    PCP: Dimas Chyle   REFERRING PROVIDER: Buford Dresser, MD   REFERRING DIAG: R53.81 (ICD-10-CM) - Physical deconditioning   THERAPY DIAG:  Muscle weakness (generalized)  Unsteadiness on feet  Other abnormalities of gait and mobility  Rationale for Evaluation and Treatment Rehabilitation  ONSET DATE: 02/02/2022   SUBJECTIVE:   Current Subjective: "Now I am a little nervous. I can't swim, you may have to  hold me" SUBJECTIVE STATEMENT: Its hard for me to walk without the walker, I rely too much on this. I use the walker to get around the house. No falls in the past couple years. Sometimes after I walk to the office at home I feel a little dizzy but it clears up. Dependent on the walker per family, trying to build up endurance per family. Per family, has some knee pain from time to time.   PERTINENT HISTORY: PMH: HTN, Afib, OA, HLD, hernia, IDA, neoplasm of prostate, cardiac valve surgery, knee arthroscopy  Glen [name] is a 88 y.o. male with a hx of  hypertension, hyperlidemia and permanent AF, previously on coumadin but now on no anticoagulation who is seen for a post hospital visit.    At his last appointment I reviewed his 07/2020 hospitalization. He was seen for a 7 day transition of care visit. All medications reviewed, as well as review of hospital course and discharge summary. Patient's main concern was for over the counter medications for sinus congestion, allergies, pain, and sleep. He was told that he could not take anything until this was cleared by a doctor. We discussed this at length. Discussed absolute avoidance of NSAIDs. [place] for physical therapy, etc from a heart perspective. Reviewed 07/2020 hospitalization for GI bleed. ECG 07/14/20 afib RVR with PVC. Only cardiac medication at that visit was metoprolol, and heart rate was 95 bpm. I discussed changing to extended release metoprolol but he declined and would prefer to keep tartrate, as he said this was what Dr. Claiborne Billings had recommended to him. He was difficult to keep on task during the interview. He had chronic LE edema, which he endorsed is from the metoprolol. We discussed chronic venous insufficiency in depth. Could not feel his afib.   Today: Since his last visit he was admitted to the hospital 08/09/2020 with nausea and black tarry stools. He was discharged on omeprazole 40 mg twice daily.   He was last seen in cardiology by Dr. Claiborne Billings 08/08/2021 where he reported  a bleeding ulcer, and a typical heart rate in the 90s. He continued to have leg swelling R > L. It was planned to start a slight additional titration of metoprolol to tartrate to 125 mg in the morning and he would continue with the 100 mg in the evening. Also recommended to wear compression socks.   Most recently he was admitted to the hospital 01/15/2022 with worsening dysphagia for one week with associated nausea and vomiting.   He is accompanied by his daughter. Overall, he states he hasn't had any problems since his most  recent hospital admission, no recurring dysphagia. He remains compliant with Prilosec.   It was recommended that he exercise more, but they are not sure what he should try or what he would be able to tolerate from a heart perspective. He becomes fatigued with minimal exertion, such as walking back and forth over the driveway or getting a shower. He notes that his heart medication tends to cause edema in his legs. Recently 1 tablet of Lasix daily did not seem to control his fluid, and he has now started taking it twice a day which seems to help.  We reviewed exercise alternatives at length today including aqua-therapy.   They are also concerned that in the mornings he often feels tremulous, dizzy, and lightheaded. His daughter wonders if this is due to low blood sugars. He prepares his breakfast which may contain cereal with Premier Protein shakes, and scrambled  eggs. After preparing breakfast he will carry it across the house to his office, and he sometimes will feel like he is going to pass out with associated tremors and lightheadedness. Typically his largest meal of the day is breakfast. At lunch he may have a sandwich or something small. Dinner is usually at 5 PM and may consist of crackers and pimento cheese or a bowl of ice cream. Then he doesn't eat again until morning. They confirm he is losing weight on his current diet.      PAIN:  Are you having pain? No  PRECAUTIONS: Fall  WEIGHT BEARING RESTRICTIONS No  FALLS:  Has patient fallen in last 6 months? No  LIVING ENVIRONMENT: Lives with: lives with their spouse but son is 10 minutes away  Lives in: House/apartment Stairs: Yes: Internal: 4 steps; can reach both does not have to go upstairs inside the home  Has following equipment at home: Gilford Rile - 2 wheeled and [employer] - 4 wheeled, shower chair, grab bars   OCCUPATION: retired   PLOF: Independent, Independent with basic ADLs, Independent with household mobility with device, and  Requires assistive device for independence  PATIENT GOALS build endurance    OBJECTIVE:   DIAGNOSTIC FINDINGS:   PATIENT SURVEYS:    COGNITION:  Overall cognitive status:  very tangential and hard to keep on track          POSTURE: rounded shoulders, forward head, decreased lumbar lordosis, increased thoracic kyphosis, and flexed trunk     LOWER EXTREMITY MMT:  MMT Right eval Left eval  Hip flexion 3+ 3+  Hip extension    Hip abduction 3 (sitting) 3 (sitting)  Hip adduction    Hip internal rotation    Hip external rotation    Knee flexion 4 4  Knee extension 4+ 4+  Ankle dorsiflexion 4- 4-  Ankle plantarflexion    Ankle inversion    Ankle eversion     (Blank rows = not tested)   FUNCTIONAL TESTS:  5 times sit to stand: 32 seconds had to use BUEs, very poor eccentric control and often "plopped" onto the table  3 minute walk test: 366f rollator no rest breaks  Tandem stance: unable to hold longer than 9 seconds without BUE support or assist from PT   GAIT: Distance walked: 321fAssistive device utilized: WaEnvironmental consultant 4 wheeled Level of assistance: SBA Comments: flexed trunk, tends to push rollator a bit far ahead of himself, slow pace, really easily fatigued     TODAY'S TREATMENT: Pt seen for aquatic therapy today.  Water 3.25-4.8 ft in depth. Temp of water was 91.  Pt entered/exited the pool via stairs step to with cga using bilat handrails with bilat rail.  Intro to setting (pt and son) Walking HHA then with water walking multiple widths all depths forward, back and side stepping. Various amounts of assistance required. VC for gait, pace control and shifting weight throughout Holding to wall side stepping 1 length 4.5 ft standing leaning against wall: df; pf; marching/add/abd and hip flex x10 ( or 2x5 as tolerated) -Ue using 1 then 2 foam hand buoys shoulder hizontal abd/add; shoulder flex/ext; add/abd x10.   Pt requires buoyancy for support and to  offload joints with strengthening exercises. Viscosity of the water is needed for resistance of strengthening; water current perturbations provides challenge to standing balance unsupported, requiring increased core activation.    PATIENT EDUCATION:  Education details: exam findings, POC, HEP, education on general process of water therapy/transition  to land  Person educated: Patient and Child(ren) Education method: Explanation Education comprehension: verbalized understanding and needs further education   HOME EXERCISE PROGRAM: Will be assigned next session   ASSESSMENT:  CLINICAL IMPRESSION: Pt requires mod assist in pool by therapist at all times. Pt apprehensive with initial visit today.  Required verbal encouragement and gentle tactile cues to participate without holding to wall.  Water walker afforded him enough support along with HHA as session progressed.  Leaning up against the wall pt increased his confidence and was able to tolerate LE and UE exercises. As session progressed he did amb using water walker with sba then HHA off of wall.  He was active for ~38 minutes with 3 standing rest periods. He also reports walking the 500 ft from parking lot to pool using rollator prior to session. Son and pt encouraged to allow for rest periods as needed in and out of facility to avoid overdoing.Pt is an excellent candidate for aquatic therapy to enhance and facilitate progression towards goals using properties of water.  Patient is a 88 y.o. male  who was seen today for physical therapy evaluation and treatment for physical deconditioning. He has had a lot of health issues in recent months, and is not very active at home- had been going to a gym but stopped when the pandemic started. Son was present and encouraged patient throughout session/helped to clarify what is difficult/not difficult right now and their personal goals. Exam reveals significant functional muscle weakness, impaired functional  mobility and general safety awareness, poor balance skills, and poor functional activity tolerance. Will benefit from skilled PT services to address functional limitations, reduce fall risk, and optimize overall level of function.   OBJECTIVE IMPAIRMENTS Abnormal gait, cardiopulmonary status limiting activity, decreased activity tolerance, decreased balance, decreased coordination, decreased endurance, decreased knowledge of use of DME, decreased mobility, difficulty walking, decreased strength, decreased safety awareness, increased fascial restrictions, postural dysfunction, and obesity.   ACTIVITY LIMITATIONS carrying, lifting, bending, standing, squatting, stairs, transfers, bed mobility, bathing, locomotion level, and caring for others  PARTICIPATION LIMITATIONS: cleaning, shopping, and community activity  PERSONAL FACTORS Age, Behavior pattern, Education, Fitness, Social background, and Time since onset of injury/illness/exacerbation are also affecting patient's functional outcome.   REHAB POTENTIAL: Good  CLINICAL DECISION MAKING: Stable/uncomplicated  EVALUATION COMPLEXITY: Low   GOALS: Goals reviewed with patient? Yes  SHORT TERM GOALS: Target date: 05/09/2022  Will be compliant with appropriate HEP  Baseline: Goal status: INITIAL  2.  Will be able to name 3 ways to reduce fall risk at home and in the community  Baseline:  Goal status: INITIAL  3.  Will be able to complete 5x sit to stand test in no more than 20 seconds  Baseline:  Goal status: INITIAL  4.  Will be able to complete all functional transfers safely with good hand placement, general sequencing, and safety awareness  Baseline:  Goal status: INITIAL   LONG TERM GOALS: Target date: 06/06/2022   MMT to improve by at least one grade in all weak groups  Baseline:  Goal status: INITIAL  2.  Will score at least 48 on the Berg to show reduced fall risk  Baseline:  Goal status: INITIAL  3.  Will be able to  ambulate at least 544f on 3MWT with LRAD to show improved functional activity tolerance  Baseline:  Goal status: INITIAL  4.  Will be compliant with regular progressive walking program at home  Baseline:  Goal status: INITIAL  5.  Will be compliant with gym based vs advanced HEP at DC to maintain functional gains/prevent recurrence of deconditioning  Baseline:  Goal status: INITIAL    PLAN: PT FREQUENCY: 2x/week  PT DURATION: 8 weeks  PLANNED INTERVENTIONS: Therapeutic exercises, Therapeutic activity, Neuromuscular re-education, Balance training, Gait training, Patient/Family education, Joint mobilization, Stair training, DME instructions, Aquatic Therapy, Cognitive remediation, Cryotherapy, Moist heat, Taping, Ultrasound, Ionotophoresis '4mg'$ /ml Dexamethasone, Manual therapy, and Re-evaluation  PLAN FOR NEXT SESSION: still needs Berg; work on strength, balance, functional activity tolerance. First 4 weeks of POC in water, second 4 weeks we will transition to land therapy    Stanton Kidney Tharon Aquas) Kaelen Brennan MPT 04/11/2022, 1:27 PM

## 2022-04-18 ENCOUNTER — Ambulatory Visit (HOSPITAL_BASED_OUTPATIENT_CLINIC_OR_DEPARTMENT_OTHER): Payer: Medicare Other | Admitting: Physical Therapy

## 2022-04-18 ENCOUNTER — Encounter (HOSPITAL_BASED_OUTPATIENT_CLINIC_OR_DEPARTMENT_OTHER): Payer: Self-pay | Admitting: Physical Therapy

## 2022-04-18 DIAGNOSIS — R2681 Unsteadiness on feet: Secondary | ICD-10-CM

## 2022-04-18 DIAGNOSIS — M6281 Muscle weakness (generalized): Secondary | ICD-10-CM | POA: Diagnosis not present

## 2022-04-18 DIAGNOSIS — R5381 Other malaise: Secondary | ICD-10-CM | POA: Diagnosis not present

## 2022-04-18 DIAGNOSIS — R2689 Other abnormalities of gait and mobility: Secondary | ICD-10-CM

## 2022-04-18 NOTE — Therapy (Signed)
OUTPATIENT PHYSICAL THERAPY LOWER EXTREMITY TREATMENT NOTE   Patient Name: Glen [name] MRN: [id number] DOB:03-12-1928, 88 y.o., male Today's Date: 04/18/2022   PT End of Session - 04/18/22 1109     Visit Number 3    Number of Visits 17    Date for PT Re-Evaluation 05/31/22    Authorization Time Period 04/05/22 to 05/31/22    PT Start Time 1108    PT Stop Time 1147    PT Time Calculation (min) 39 min    Behavior During Therapy [facility] for tasks assessed/performed             Past Medical History:  Diagnosis Date   Atrial fibrillation ([place])    Diverticulitis    Esophageal dysmotility    Family history of diabetes insipidus    FHx: colon cancer    H. pylori infection    Hiatal hernia    History of colonic polyps    Hyperlipidemia    Hypertension    IDA (iron deficiency anemia)    Neoplasm of prostate    special screening malignant    Obesity    Palpitation    Peptic ulcer disease    Presbyesophagus    Schatzki's ring    Past Surgical History:  Procedure Laterality Date   2-D Echocardiogram  November 2009, 2011   Anal Fistula Repair     BALLOON DILATION N/A 01/17/2022   Procedure: [place];  Surgeon: Jackquline Denmark, MD;  Location: [facility] ENDOSCOPY;  Service: Gastroenterology;  Laterality: N/A;   BIOPSY  07/14/2020   Procedure: BIOPSY;  Surgeon: [name], MD;  Location: [facility] ENDOSCOPY;  Service: Gastroenterology;;   BIOPSY  01/17/2022   Procedure: BIOPSY;  Surgeon: Jackquline Denmark, MD;  Location: [facility] ENDOSCOPY;  Service: Gastroenterology;;   CARDIAC VALVE SURGERY  1996   status post balloon valvuloplasty at [place]  2004   COLONOSCOPY  December 2005   ESOPHAGOGASTRODUODENOSCOPY  07/14/2020   ESOPHAGOGASTRODUODENOSCOPY N/A 01/17/2022   Procedure: ESOPHAGOGASTRODUODENOSCOPY (EGD);  Surgeon: Jackquline Denmark, MD;  Location: [facility] ENDOSCOPY;  Service: Gastroenterology;  Laterality: N/A;   ESOPHAGOGASTRODUODENOSCOPY (EGD) WITH PROPOFOL N/A 07/14/2020    Procedure: ESOPHAGOGASTRODUODENOSCOPY (EGD) WITH PROPOFOL;  Surgeon: [name], MD;  Location: [facility] ENDOSCOPY;  Service: Gastroenterology;  Laterality: N/A;   ETT  1991   Negative   HEMOSTASIS CLIP PLACEMENT  07/14/2020   HEMOSTASIS CLIP PLACEMENT   HEMOSTASIS CLIP PLACEMENT  07/14/2020   Procedure: HEMOSTASIS CLIP PLACEMENT;  Surgeon: [name], MD;  Location: [facility] ENDOSCOPY;  Service: Gastroenterology;;   KNEE ARTHROSCOPY     Right   TONSILLECTOMY     Patient Active Problem List   Diagnosis Date Noted   Physical deconditioning 01/16/2022   Dehydration 01/16/2022   Peptic ulcer disease    Fall 07/13/2020   Osteoarthritis 06/25/2019   Onychomycosis 07/29/2018   Esophageal dysphagia 07/29/2018   Peripheral edema 11/18/2015   Impaired glucose tolerance 04/19/2015   Essential hypertension 01/20/2009   Atrial fibrillation, chronic ([place]) 03/13/2008   Dyslipidemia 07/31/2007   History of colonic polyps 07/31/2007    PCP: Dimas Chyle   REFERRING PROVIDER: Buford Dresser, MD   REFERRING DIAG: R53.81 (ICD-10-CM) - Physical deconditioning   THERAPY DIAG:  Muscle weakness (generalized)  Unsteadiness on feet  Other abnormalities of gait and mobility  Rationale for Evaluation and Treatment Rehabilitation  ONSET DATE: 02/02/2022   SUBJECTIVE:   SUBJECTIVE STATEMENT:  "I'm not doing too well... I had 4 teeth pulled  yesterday."  Pt reports he did good after last aquatic visit; knees were sore the following day.     PERTINENT HISTORY: PMH: HTN, Afib, OA, HLD, hernia, IDA, neoplasm of prostate, cardiac valve surgery, knee arthroscopy  Glen [name] is a 88 y.o. male with a hx of hypertension, hyperlidemia and permanent AF, previously on coumadin but now on no anticoagulation who is seen for a post hospital visit.    At his last appointment I reviewed his 07/2020 hospitalization. He was seen for a 7 day transition of care visit. All medications reviewed, as well  as review of hospital course and discharge summary. Patient's main concern was for over the counter medications for sinus congestion, allergies, pain, and sleep. He was told that he could not take anything until this was cleared by a doctor. We discussed this at length. Discussed absolute avoidance of NSAIDs. [place] for physical therapy, etc from a heart perspective. Reviewed 07/2020 hospitalization for GI bleed. ECG 07/14/20 afib RVR with PVC. Only cardiac medication at that visit was metoprolol, and heart rate was 95 bpm. I discussed changing to extended release metoprolol but he declined and would prefer to keep tartrate, as he said this was what Dr. Claiborne Billings had recommended to him. He was difficult to keep on task during the interview. He had chronic LE edema, which he endorsed is from the metoprolol. We discussed chronic venous insufficiency in depth. Could not feel his afib.   Today: Since his last visit he was admitted to the hospital 08/09/2020 with nausea and black tarry stools. He was discharged on omeprazole 40 mg twice daily.   He was last seen in cardiology by Dr. Claiborne Billings 08/08/2021 where he reported  a bleeding ulcer, and a typical heart rate in the 90s. He continued to have leg swelling R > L. It was planned to start a slight additional titration of metoprolol to tartrate to 125 mg in the morning and he would continue with the 100 mg in the evening. Also recommended to wear compression socks.   Most recently he was admitted to the hospital 01/15/2022 with worsening dysphagia for one week with associated nausea and vomiting.   He is accompanied by his daughter. Overall, he states he hasn't had any problems since his most recent hospital admission, no recurring dysphagia. He remains compliant with Prilosec.   It was recommended that he exercise more, but they are not sure what he should try or what he would be able to tolerate from a heart perspective. He becomes fatigued with minimal exertion, such as  walking back and forth over the driveway or getting a shower. He notes that his heart medication tends to cause edema in his legs. Recently 1 tablet of Lasix daily did not seem to control his fluid, and he has now started taking it twice a day which seems to help.  We reviewed exercise alternatives at length today including aqua-therapy.   They are also concerned that in the mornings he often feels tremulous, dizzy, and lightheaded. His daughter wonders if this is due to low blood sugars. He prepares his breakfast which may contain cereal with Premier Protein shakes, and scrambled eggs. After preparing breakfast he will carry it across the house to his office, and he sometimes will feel like he is going to pass out with associated tremors and lightheadedness. Typically his largest meal of the day is breakfast. At lunch he may have a sandwich or something small. Dinner is usually at 5 PM and  may consist of crackers and pimento cheese or a bowl of ice cream. Then he doesn't eat again until morning. They confirm he is losing weight on his current diet.      PAIN:  Are you having pain? Yes Location: back Pain rating: 4/10 Description: achy Aggravating factors:  sitting too long, walking too fast Alleviating factors: ?   PRECAUTIONS: Fall  WEIGHT BEARING RESTRICTIONS No  FALLS:  Has patient fallen in last 6 months? No  LIVING ENVIRONMENT: Lives with: lives with their spouse but son is 10 minutes away  Lives in: House/apartment Stairs: Yes: Internal: 4 steps; can reach both does not have to go upstairs inside the home  Has following equipment at home: Gilford Rile - 2 wheeled and [employer] - 4 wheeled, shower chair, grab bars   OCCUPATION: retired   PLOF: Independent, Independent with basic ADLs, Independent with household mobility with device, and Requires assistive device for independence  PATIENT GOALS build endurance    OBJECTIVE:   DIAGNOSTIC FINDINGS:   PATIENT SURVEYS:     COGNITION:  Overall cognitive status:  very tangential and hard to keep on track          POSTURE: rounded shoulders, forward head, decreased lumbar lordosis, increased thoracic kyphosis, and flexed trunk     LOWER EXTREMITY MMT:  MMT Right eval Left eval  Hip flexion 3+ 3+  Hip extension    Hip abduction 3 (sitting) 3 (sitting)  Hip adduction    Hip internal rotation    Hip external rotation    Knee flexion 4 4  Knee extension 4+ 4+  Ankle dorsiflexion 4- 4-  Ankle plantarflexion    Ankle inversion    Ankle eversion     (Blank rows = not tested)   FUNCTIONAL TESTS:  5 times sit to stand: 32 seconds had to use BUEs, very poor eccentric control and often "plopped" onto the table  3 minute walk test: 343f rollator no rest breaks  Tandem stance: unable to hold longer than 9 seconds without BUE support or assist from PT   GAIT: Distance walked: [id number] device utilized: WaEnvironmental consultant 4 wheeled Level of assistance: SBA Comments: flexed trunk, tends to push rollator a bit far ahead of himself, slow pace, really easily fatigued     TODAY'S TREATMENT: Pt seen for aquatic therapy today.  Water 3.25-4.8 ft in depth. Temp of water was 91.  Pt entered/exited the pool via stairs step to with CGA using bilat handrails.  * in water walker and CGA:  forward gait, backward gait, side stepping, VC for direction feet pointing, posture, and pace * Holding wall:   squats, heel raises, hip abdct / add; high knee marching * Seated on bench, with CGA to LE:  STS x 5 with cues to steady once standing;  holding rainbow hand buoys - row; bilat shoulder horiz abdct/add; cervical lateral flex; cervical rotation * holding yellow noodle with HHA - forward and backward gait with cues to relax shoulders and for upright posture; intermittent rest breaks with back against wall * holding yellow noodle with intermittent CGA to correct post lean   Pt requires buoyancy for support and to  offload joints with strengthening exercises. Viscosity of the water is needed for resistance of strengthening; water current perturbations provides challenge to standing balance unsupported, requiring increased core activation.    PATIENT EDUCATION:  Education details: exam findings, POC, HEP, education on general process of water therapy/transition to land  Person educated: Patient and  Child(ren) Education method: Explanation Education comprehension: verbalized understanding and needs further education   HOME EXERCISE PROGRAM: Will be assigned next session   ASSESSMENT:  CLINICAL IMPRESSION: Pt requires mod assist in pool by therapist at all times. Pt continues to be apprehensive when not holding onto wall.  He reported some band like "tightness" around forehead after entering water; resolved with time during session. He needs minor cues to redirect back to exercise and for more upright posture with relaxed shoulders.  He is given intermittent rest breaks with back support against wall throughout session.  He had difficulty with balance of sitting on bench in water; required CGA to LE to prevent him from floating on back.  He reported increased knee tightness/discomfort at completion of session.  Pt continues to be an excellent candidate for aquatic therapy to enhance and facilitate progression towards goals using properties of water.  Pt's initial exam reveals significant functional muscle weakness, impaired functional mobility and general safety awareness, poor balance skills, and poor functional activity tolerance. Will benefit from skilled PT services to address functional limitations, reduce fall risk, and optimize overall level of function.   OBJECTIVE IMPAIRMENTS Abnormal gait, cardiopulmonary status limiting activity, decreased activity tolerance, decreased balance, decreased coordination, decreased endurance, decreased knowledge of use of DME, decreased mobility, difficulty walking,  decreased strength, decreased safety awareness, increased fascial restrictions, postural dysfunction, and obesity.   ACTIVITY LIMITATIONS carrying, lifting, bending, standing, squatting, stairs, transfers, bed mobility, bathing, locomotion level, and caring for others  PARTICIPATION LIMITATIONS: cleaning, shopping, and community activity  PERSONAL FACTORS Age, Behavior pattern, Education, Fitness, Social background, and Time since onset of injury/illness/exacerbation are also affecting patient's functional outcome.   REHAB POTENTIAL: Good  CLINICAL DECISION MAKING: Stable/uncomplicated  EVALUATION COMPLEXITY: Low   GOALS: Goals reviewed with patient? Yes  SHORT TERM GOALS: Target date: 05/16/2022  Will be compliant with appropriate HEP  Baseline: Goal status: INITIAL  2.  Will be able to name 3 ways to reduce fall risk at home and in the community  Baseline:  Goal status: INITIAL  3.  Will be able to complete 5x sit to stand test in no more than 20 seconds  Baseline:  Goal status: INITIAL  4.  Will be able to complete all functional transfers safely with good hand placement, general sequencing, and safety awareness  Baseline:  Goal status: INITIAL   LONG TERM GOALS: Target date: 06/13/2022   MMT to improve by at least one grade in all weak groups  Baseline:  Goal status: INITIAL  2.  Will score at least 48 on the Berg to show reduced fall risk  Baseline:  Goal status: INITIAL  3.  Will be able to ambulate at least 544f on 3MWT with LRAD to show improved functional activity tolerance  Baseline:  Goal status: INITIAL  4.  Will be compliant with regular progressive walking program at home  Baseline:  Goal status: INITIAL  5.  Will be compliant with gym based vs advanced HEP at DC to maintain functional gains/prevent recurrence of deconditioning  Baseline:  Goal status: INITIAL    PLAN: PT FREQUENCY: 2x/week  PT DURATION: 8 weeks  PLANNED INTERVENTIONS:  Therapeutic exercises, Therapeutic activity, Neuromuscular re-education, Balance training, Gait training, Patient/Family education, Joint mobilization, Stair training, DME instructions, Aquatic Therapy, Cognitive remediation, Cryotherapy, Moist heat, Taping, Ultrasound, Ionotophoresis '4mg'$ /ml Dexamethasone, Manual therapy, and Re-evaluation  PLAN FOR NEXT SESSION: still needs Berg; work on strength, balance, functional activity tolerance. First 4 weeks of POC in  water, second 4 weeks we will transition to land therapy   Kerin Perna, PTA 04/18/22 3:20 PM

## 2022-04-20 ENCOUNTER — Ambulatory Visit (HOSPITAL_BASED_OUTPATIENT_CLINIC_OR_DEPARTMENT_OTHER): Payer: Medicare Other | Admitting: Physical Therapy

## 2022-04-20 ENCOUNTER — Encounter (HOSPITAL_BASED_OUTPATIENT_CLINIC_OR_DEPARTMENT_OTHER): Payer: Self-pay

## 2022-04-21 ENCOUNTER — Encounter (HOSPITAL_BASED_OUTPATIENT_CLINIC_OR_DEPARTMENT_OTHER): Payer: Self-pay | Admitting: Physical Therapy

## 2022-04-21 ENCOUNTER — Ambulatory Visit (HOSPITAL_BASED_OUTPATIENT_CLINIC_OR_DEPARTMENT_OTHER): Payer: Medicare Other | Admitting: Physical Therapy

## 2022-04-21 DIAGNOSIS — R2689 Other abnormalities of gait and mobility: Secondary | ICD-10-CM

## 2022-04-21 DIAGNOSIS — M6281 Muscle weakness (generalized): Secondary | ICD-10-CM | POA: Diagnosis not present

## 2022-04-21 DIAGNOSIS — R2681 Unsteadiness on feet: Secondary | ICD-10-CM | POA: Diagnosis not present

## 2022-04-21 DIAGNOSIS — R5381 Other malaise: Secondary | ICD-10-CM | POA: Diagnosis not present

## 2022-04-21 NOTE — Therapy (Signed)
OUTPATIENT PHYSICAL THERAPY LOWER EXTREMITY TREATMENT NOTE   Patient Name: JAIVYN GULLA MRN: [id number] DOB:23-Sep-1928, 88 y.o., male Today's Date: 04/21/2022   PT End of Session - 04/21/22 0805     Visit Number 4    Number of Visits 17    Date for PT Re-Evaluation 05/31/22    Authorization Time Period 04/05/22 to 05/31/22    PT Start Time 0803    PT Stop Time 8563    PT Time Calculation (min) 41 min    Activity Tolerance Patient tolerated treatment well    Behavior During Therapy [place] [facility] for tasks assessed/performed             Past Medical History:  Diagnosis Date   Atrial fibrillation ([place])    Diverticulitis    Esophageal dysmotility    Family history of diabetes insipidus    FHx: colon cancer    H. pylori infection    Hiatal hernia    History of colonic polyps    Hyperlipidemia    Hypertension    IDA (iron deficiency anemia)    Neoplasm of prostate    special screening malignant    Obesity    Palpitation    Peptic ulcer disease    Presbyesophagus    Schatzki's ring    Past Surgical History:  Procedure Laterality Date   2-D Echocardiogram  November 2009, 2011   Anal Fistula Repair     BALLOON DILATION N/A 01/17/2022   Procedure: [place];  Surgeon: Jackquline Denmark, MD;  Location: [facility];  Service: Gastroenterology;  Laterality: N/A;   BIOPSY  07/14/2020   Procedure: BIOPSY;  Surgeon: [name], MD;  Location: [facility] ENDOSCOPY;  Service: Gastroenterology;;   BIOPSY  01/17/2022   Procedure: BIOPSY;  Surgeon: Jackquline Denmark, MD;  Location: [facility] ENDOSCOPY;  Service: Gastroenterology;;   CARDIAC VALVE SURGERY  1996   status post balloon valvuloplasty at [place]  2004   COLONOSCOPY  December 2005   ESOPHAGOGASTRODUODENOSCOPY  07/14/2020   ESOPHAGOGASTRODUODENOSCOPY N/A 01/17/2022   Procedure: ESOPHAGOGASTRODUODENOSCOPY (EGD);  Surgeon: Jackquline Denmark, MD;  Location: [facility];  Service: Gastroenterology;  Laterality: N/A;    ESOPHAGOGASTRODUODENOSCOPY (EGD) WITH PROPOFOL N/A 07/14/2020   Procedure: ESOPHAGOGASTRODUODENOSCOPY (EGD) WITH PROPOFOL;  Surgeon: [name], MD;  Location: [facility] ENDOSCOPY;  Service: Gastroenterology;  Laterality: N/A;   ETT  1991   Negative   HEMOSTASIS CLIP PLACEMENT  07/14/2020   HEMOSTASIS CLIP PLACEMENT   HEMOSTASIS CLIP PLACEMENT  07/14/2020   Procedure: HEMOSTASIS CLIP PLACEMENT;  Surgeon: [name], MD;  Location: [facility] ENDOSCOPY;  Service: Gastroenterology;;   KNEE ARTHROSCOPY     Right   TONSILLECTOMY     Patient Active Problem List   Diagnosis Date Noted   Physical deconditioning 01/16/2022   Dehydration 01/16/2022   Peptic ulcer disease    Fall 07/13/2020   Osteoarthritis 06/25/2019   Onychomycosis 07/29/2018   Esophageal dysphagia 07/29/2018   Peripheral edema 11/18/2015   Impaired glucose tolerance 04/19/2015   Essential hypertension 01/20/2009   Atrial fibrillation, chronic ([place]) 03/13/2008   Dyslipidemia 07/31/2007   History of colonic polyps 07/31/2007    PCP: Dimas Chyle   REFERRING PROVIDER: Buford Dresser, MD   REFERRING DIAG: R53.81 (ICD-10-CM) - Physical deconditioning   THERAPY DIAG:  Muscle weakness (generalized)  Unsteadiness on feet  Other abnormalities of gait and mobility  Rationale for Evaluation and Treatment Rehabilitation  ONSET DATE: 02/02/2022   SUBJECTIVE:   SUBJECTIVE STATEMENT:  Pt  reports that he took 3 tylenol before session , "to avoid feeling yucky afterwards".   He states he took a nap after last session and needed 3 tylenol that night. "2 (tylenol) doesn't do anything, so I need 3".      PERTINENT HISTORY: PMH: HTN, Afib, OA, HLD, hernia, IDA, neoplasm of prostate, cardiac valve surgery, knee arthroscopy  NIRVAAN FRETT is a 88 y.o. male with a hx of hypertension, hyperlidemia and permanent AF, previously on coumadin but now on no anticoagulation who is seen for a post hospital visit.    At his last  appointment I reviewed his 07/2020 hospitalization. He was seen for a 7 day transition of care visit. All medications reviewed, as well as review of hospital course and discharge summary. Patient's main concern was for over the counter medications for sinus congestion, allergies, pain, and sleep. He was told that he could not take anything until this was cleared by a doctor. We discussed this at length. Discussed absolute avoidance of NSAIDs. [place] for physical therapy, etc from a heart perspective. Reviewed 07/2020 hospitalization for GI bleed. ECG 07/14/20 afib RVR with PVC. Only cardiac medication at that visit was metoprolol, and heart rate was 95 bpm. I discussed changing to extended release metoprolol but he declined and would prefer to keep tartrate, as he said this was what Dr. Claiborne Billings had recommended to him. He was difficult to keep on task during the interview. He had chronic LE edema, which he endorsed is from the metoprolol. We discussed chronic venous insufficiency in depth. Could not feel his afib.   Today: Since his last visit he was admitted to the hospital 08/09/2020 with nausea and black tarry stools. He was discharged on omeprazole 40 mg twice daily.   He was last seen in cardiology by Dr. Claiborne Billings 08/08/2021 where he reported  a bleeding ulcer, and a typical heart rate in the 90s. He continued to have leg swelling R > L. It was planned to start a slight additional titration of metoprolol to tartrate to 125 mg in the morning and he would continue with the 100 mg in the evening. Also recommended to wear compression socks.   Most recently he was admitted to the hospital 01/15/2022 with worsening dysphagia for one week with associated nausea and vomiting.   He is accompanied by his daughter. Overall, he states he hasn't had any problems since his most recent hospital admission, no recurring dysphagia. He remains compliant with Prilosec.   It was recommended that he exercise more, but they are not  sure what he should try or what he would be able to tolerate from a heart perspective. He becomes fatigued with minimal exertion, such as walking back and forth over the driveway or getting a shower. He notes that his heart medication tends to cause edema in his legs. Recently 1 tablet of Lasix daily did not seem to control his fluid, and he has now started taking it twice a day which seems to help.  We reviewed exercise alternatives at length today including aqua-therapy.   They are also concerned that in the mornings he often feels tremulous, dizzy, and lightheaded. His daughter wonders if this is due to low blood sugars. He prepares his breakfast which may contain cereal with Premier Protein shakes, and scrambled eggs. After preparing breakfast he will carry it across the house to his office, and he sometimes will feel like he is going to pass out with associated tremors and lightheadedness. Typically his  largest meal of the day is breakfast. At lunch he may have a sandwich or something small. Dinner is usually at 5 PM and may consist of crackers and pimento cheese or a bowl of ice cream. Then he doesn't eat again until morning. They confirm he is losing weight on his current diet.      PAIN:  Are you having pain? Yes Location: LE Pain rating: 2/10 Description: achy Aggravating factors:   walking too fast Alleviating factors: ?   PRECAUTIONS: Fall  WEIGHT BEARING RESTRICTIONS No  FALLS:  Has patient fallen in last 6 months? No  LIVING ENVIRONMENT: Lives with: lives with their spouse but son is 10 minutes away  Lives in: House/apartment Stairs: Yes: Internal: 4 steps; can reach both does not have to go upstairs inside the home  Has following equipment at home: Gilford Rile - 2 wheeled and [employer] - 4 wheeled, shower chair, grab bars   OCCUPATION: retired   PLOF: Independent, Independent with basic ADLs, Independent with household mobility with device, and Requires assistive device for  independence  PATIENT GOALS build endurance    OBJECTIVE:   DIAGNOSTIC FINDINGS:   PATIENT SURVEYS:    COGNITION:  Overall cognitive status:  very tangential and hard to keep on track          POSTURE: rounded shoulders, forward head, decreased lumbar lordosis, increased thoracic kyphosis, and flexed trunk     LOWER EXTREMITY MMT:  MMT Right eval Left eval  Hip flexion 3+ 3+  Hip extension    Hip abduction 3 (sitting) 3 (sitting)  Hip adduction    Hip internal rotation    Hip external rotation    Knee flexion 4 4  Knee extension 4+ 4+  Ankle dorsiflexion 4- 4-  Ankle plantarflexion    Ankle inversion    Ankle eversion     (Blank rows = not tested)   FUNCTIONAL TESTS:  5 times sit to stand: 32 seconds had to use BUEs, very poor eccentric control and often "plopped" onto the table  3 minute walk test: 376f rollator no rest breaks  Tandem stance: unable to hold longer than 9 seconds without BUE support or assist from PT   GAIT: Distance walked: [id number] device utilized: WaEnvironmental consultant 4 wheeled Level of assistance: SBA Comments: flexed trunk, tends to push rollator a bit far ahead of himself, slow pace, really easily fatigued     TODAY'S TREATMENT: Pt seen for aquatic therapy today.  Water 3.25-4.8 ft in depth. Temp of water was 91.  Pt entered/exited the pool via stairs step to with CGA using bilat handrails.  * in water walker and intermittent CGA to walker/ SBA:    -Multiple laps of forward gait, backward gait, side stepping, VC for direction feet pointing, posture, and pace.     - with backward gait, working on starting/stopping without assistance   - high knee marching    - tandem stance (very difficult to attain)   - tandem gait forward (very difficult to complete)   - 360 turn each direction x 1   - SLS (unable to complete on LLE due to posterior LOB each time)    * Holding wall:   squats, heel raises, hip abdct / add; high knee marching *  Seated on bench, with water walker around him and CGA to steady walker:  STS x 5 with cues to steady once standing; LAQ with DF, cycling * returned to forward walking with water walker  Pt requires buoyancy for support and to offload joints with strengthening exercises. Viscosity of the water is needed for resistance of strengthening; water current perturbations provides challenge to standing balance unsupported, requiring increased core activation.    PATIENT EDUCATION:  Education details: exam findings, POC, HEP, education on general process of water therapy/transition to land  Person educated: Patient and Child(ren) Education method: Explanation Education comprehension: verbalized understanding and needs further education   HOME EXERCISE PROGRAM: Will be assigned next land session   ASSESSMENT:  CLINICAL IMPRESSION:  Pt unaware of 1/2 dollar-sized bruise on Rt side of chin and did not recall fall at home; pt's daughter in-law unaware of bruise on Rt side of chin.  Pt requires presence of therapist in pool at all times due to decreased balance and decreased confidence in water. Pt demonstrated improved confidence walking in water with use of water walker.  He often has posterior lean with side stepping and backwards gait, requiring CGA of therapist to walker to correct LOB. He needed 2 short rest breaks with back support against wall during session. Improved seated balance on bench in water with water walker still in place around him.   Pt continues to be an excellent candidate for aquatic therapy to enhance and facilitate progression towards goals using properties of water. Will benefit from skilled PT services to address functional limitations, reduce fall risk, and optimize overall level of function.Goals are ongoing.    OBJECTIVE IMPAIRMENTS Abnormal gait, cardiopulmonary status limiting activity, decreased activity tolerance, decreased balance, decreased coordination, decreased  endurance, decreased knowledge of use of DME, decreased mobility, difficulty walking, decreased strength, decreased safety awareness, increased fascial restrictions, postural dysfunction, and obesity.   ACTIVITY LIMITATIONS carrying, lifting, bending, standing, squatting, stairs, transfers, bed mobility, bathing, locomotion level, and caring for others  PARTICIPATION LIMITATIONS: cleaning, shopping, and community activity  PERSONAL FACTORS Age, Behavior pattern, Education, Fitness, Social background, and Time since onset of injury/illness/exacerbation are also affecting patient's functional outcome.   REHAB POTENTIAL: Good  CLINICAL DECISION MAKING: Stable/uncomplicated  EVALUATION COMPLEXITY: Low   GOALS: Goals reviewed with patient? Yes  SHORT TERM GOALS: Target date: 05/19/2022  Will be compliant with appropriate HEP  Baseline: Goal status: INITIAL  2.  Will be able to name 3 ways to reduce fall risk at home and in the community  Baseline:  Goal status: INITIAL  3.  Will be able to complete 5x sit to stand test in no more than 20 seconds  Baseline:  Goal status: INITIAL  4.  Will be able to complete all functional transfers safely with good hand placement, general sequencing, and safety awareness  Baseline:  Goal status: INITIAL   LONG TERM GOALS: Target date: 06/16/2022   MMT to improve by at least one grade in all weak groups  Baseline:  Goal status: INITIAL  2.  Will score at least 48 on the Berg to show reduced fall risk  Baseline:  Goal status: INITIAL  3.  Will be able to ambulate at least 522f on 3MWT with LRAD to show improved functional activity tolerance  Baseline:  Goal status: INITIAL  4.  Will be compliant with regular progressive walking program at home  Baseline:  Goal status: INITIAL  5.  Will be compliant with gym based vs advanced HEP at DC to maintain functional gains/prevent recurrence of deconditioning  Baseline:  Goal status:  INITIAL    PLAN: PT FREQUENCY: 2x/week  PT DURATION: 8 weeks  PLANNED INTERVENTIONS: Therapeutic exercises, Therapeutic activity,  Neuromuscular re-education, Balance training, Gait training, Patient/Family education, Joint mobilization, Stair training, DME instructions, Aquatic Therapy, Cognitive remediation, Cryotherapy, Moist heat, Taping, Ultrasound, Ionotophoresis '4mg'$ /ml Dexamethasone, Manual therapy, and Re-evaluation  PLAN FOR NEXT SESSION: still needs Berg; work on strength, balance, functional activity tolerance. First 4 weeks of POC in water, second 4 weeks we will transition to land therapy   Kerin Perna, PTA 04/21/22 4:20 PM

## 2022-04-28 ENCOUNTER — Encounter (HOSPITAL_BASED_OUTPATIENT_CLINIC_OR_DEPARTMENT_OTHER): Payer: Self-pay

## 2022-05-03 ENCOUNTER — Encounter (HOSPITAL_BASED_OUTPATIENT_CLINIC_OR_DEPARTMENT_OTHER): Payer: Self-pay | Admitting: Physical Therapy

## 2022-05-03 ENCOUNTER — Ambulatory Visit (HOSPITAL_BASED_OUTPATIENT_CLINIC_OR_DEPARTMENT_OTHER): Payer: Medicare Other | Attending: Cardiology | Admitting: Physical Therapy

## 2022-05-03 DIAGNOSIS — R2689 Other abnormalities of gait and mobility: Secondary | ICD-10-CM | POA: Insufficient documentation

## 2022-05-03 DIAGNOSIS — M6281 Muscle weakness (generalized): Secondary | ICD-10-CM | POA: Diagnosis not present

## 2022-05-03 DIAGNOSIS — R2681 Unsteadiness on feet: Secondary | ICD-10-CM | POA: Diagnosis not present

## 2022-05-03 NOTE — Therapy (Signed)
OUTPATIENT PHYSICAL THERAPY LOWER EXTREMITY TREATMENT NOTE   Patient Name: Glen [name] MRN: [id number] DOB:January 21, 1928, 88 y.o., male Today's Date: 05/03/2022   PT End of Session - 05/03/22 0954     Visit Number 5    Number of Visits 17    Date for PT Re-Evaluation 05/31/22    Authorization Time Period 04/05/22 to 05/31/22    PT Start Time 0949    PT Stop Time 1030    PT Time Calculation (min) 41 min    Activity Tolerance Patient tolerated treatment well    Behavior During Therapy [facility] for tasks assessed/performed             Past Medical History:  Diagnosis Date   Atrial fibrillation ([place])    Diverticulitis    Esophageal dysmotility    Family history of diabetes insipidus    FHx: colon cancer    H. pylori infection    Hiatal hernia    History of colonic polyps    Hyperlipidemia    Hypertension    IDA (iron deficiency anemia)    Neoplasm of prostate    special screening malignant    Obesity    Palpitation    Peptic ulcer disease    Presbyesophagus    Schatzki's ring    Past Surgical History:  Procedure Laterality Date   2-D Echocardiogram  November 2009, 2011   Anal Fistula Repair     BALLOON DILATION N/A 01/17/2022   Procedure: [place];  Surgeon: Jackquline Denmark, MD;  Location: [facility] END[facility]COPY;  Service: Gastroenterology;  Laterality: N/A;   BIOPSY  07/14/2020   Procedure: BIOPSY;  Surgeon: [name], MD;  Location: [facility] ENDOSCOPY;  Service: Gastroenterology;;   BIOPSY  01/17/2022   Procedure: BIOPSY;  Surgeon: Jackquline Denmark, MD;  Location: [facility] ENDOSCOPY;  Service: Gastroenterology;;   CARDIAC VALVE SURGERY  1996   status post balloon valvuloplasty at [place]  2004   COLONOSCOPY  December 2005   ESOPHAGOGASTRODUODENOSCOPY  07/14/2020   ESOPHAGOGASTRODUODENOSCOPY N/A 01/17/2022   Procedure: ESOPHAGOGASTRODUODENOSCOPY (EGD);  Surgeon: Jackquline Denmark, MD;  Location: [facility]OS[facility];  Service: Gastroenterology;  Laterality: N/A;    ESOPHAGOGASTRODUODENOSCOPY (EGD) WITH PROPOFOL N/A 07/14/2020   Procedure: ESOPHAGOGASTRODUODENOSCOPY (EGD) WITH PROPOFOL;  Surgeon: [name], MD;  Location: [facility] ENDOSCOPY;  Service: Gastroenterology;  Laterality: N/A;   ETT  1991   Negative   HEMOSTASIS CLIP PLACEMENT  07/14/2020   HEMOSTASIS CLIP PLACEMENT   HEMOSTASIS CLIP PLACEMENT  07/14/2020   Procedure: HEMOSTASIS CLIP PLACEMENT;  Surgeon: [name], MD;  Location: [facility] ENDOSCOPY;  Service: Gastroenterology;;   KNEE ARTHROSCOPY     Right   TONSILLECTOMY     Patient Active Problem List   Diagnosis Date Noted   Physical deconditioning 01/16/2022   Dehydration 01/16/2022   Peptic ulcer disease    Fall 07/13/2020   Osteoarthritis 06/25/2019   Onychomycosis 07/29/2018   Esophageal dysphagia 07/29/2018   Peripheral edema 11/18/2015   Impaired glucose tolerance 04/19/2015   Essential hypertension 01/20/2009   Atrial fibrillation, chronic ([place]) 03/13/2008   Dyslipidemia 07/31/2007   History of colonic polyps 07/31/2007    PCP: Dimas Chyle   REFERRING PROVIDER: Buford Dresser, MD   REFERRING DIAG: R53.81 (ICD-10-CM) - Physical deconditioning   THERAPY DIAG:  Muscle weakness (generalized)  Unsteadiness on feet  Other abnormalities of gait and mobility  Rationale for Evaluation and Treatment Rehabilitation  ONSET DATE: 02/02/2022   SUBJECTIVE:   SUBJECTIVE STATEMENT:  Pt  reports that he walked in yard and did some exercises at counter since last visit.   He states he noticed a rash on his Rt leg today; doesn't remember any contact with anything irritating.  (Observed to have rash on lateral and posterior Rt lower leg)     PERTINENT HISTORY: PMH: HTN, Afib, OA, HLD, hernia, IDA, neoplasm of prostate, cardiac valve surgery, knee arthroscopy  Glen [name] is a 88 y.o. male with a hx of hypertension, hyperlidemia and permanent AF, previously on coumadin but now on no anticoagulation who is seen for a  post hospital visit.    At his last appointment I reviewed his 07/2020 hospitalization. He was seen for a 7 day transition of care visit. All medications reviewed, as well as review of hospital course and discharge summary. Patient's main concern was for over the counter medications for sinus congestion, allergies, pain, and sleep. He was told that he could not take anything until this was cleared by a doctor. We discussed this at length. Discussed absolute avoidance of NSAIDs. Josephine for physical therapy, etc from a heart perspective. Reviewed 07/2020 hospitalization for GI bleed. ECG 07/14/20 afib RVR with PVC. Only cardiac medication at that visit was metoprolol, and heart rate was 95 bpm. I discussed changing to extended release metoprolol but he declined and would prefer to keep tartrate, as he said this was what Dr. Claiborne Billings had recommended to him. He was difficult to keep on task during the interview. He had chronic LE edema, which he endorsed is from the metoprolol. We discussed chronic venous insufficiency in depth. Could not feel his afib.     PAIN:  Are you having pain? No Location:  Pain rating:  Description:  Aggravating factors:  lower back / buttocks f sitting on hard surface Alleviating factors: medicine   PRECAUTIONS: Fall  WEIGHT BEARING RESTRICTIONS No  FALLS:  Has patient fallen in last 6 months? No  LIVING ENVIRONMENT: Lives with: lives with their spouse but son is 10 minutes away  Lives in: House/apartment Stairs: Yes: Internal: 4 steps; can reach both does not have to go upstairs inside the home  Has following equipment at home: Gilford Rile - 2 wheeled and [employer] - 4 wheeled, shower chair, grab bars   OCCUPATION: retired   PLOF: Independent, Independent with basic ADLs, Independent with household mobility with device, and Requires assistive device for independence  PATIENT GOALS build endurance    OBJECTIVE:  * findings taken at evaluation unless otherwise  noted  DIAGNOSTIC FINDINGS:   PATIENT SURVEYS:    COGNITION:  Overall cognitive status:  very tangential and hard to keep on track          POSTURE: rounded shoulders, forward head, decreased lumbar lordosis, increased thoracic kyphosis, and flexed trunk     LOWER EXTREMITY MMT:  MMT Right eval Left eval  Hip flexion 3+ 3+  Hip extension    Hip abduction 3 (sitting) 3 (sitting)  Hip adduction    Hip internal rotation    Hip external rotation    Knee flexion 4 4  Knee extension 4+ 4+  Ankle dorsiflexion 4- 4-  Ankle plantarflexion    Ankle inversion    Ankle eversion     (Blank rows = not tested)   FUNCTIONAL TESTS:  5 times sit to stand: 32 seconds had to use BUEs, very poor eccentric control and often "plopped" onto the table  3 minute walk test: 342f rollator no rest breaks  Tandem stance: unable  to hold longer than 9 seconds without BUE support or assist from PT   GAIT: Distance walked: 346f Assistive device utilized: [employer]- 4 wheeled Level of assistance: SBA Comments: flexed trunk, tends to push rollator a bit far ahead of himself, slow pace, really easily fatigued     TODAY'S TREATMENT: Pt seen for aquatic therapy today.  Water 3.25-4.8 ft in depth. Temp of water was 91.  Pt entered/exited the pool via stairs step to with SBA using bilat handrails.  * in water walker and intermittent CGA to walker/ SBA:    -Multiple laps of forward gait, backward gait, side stepping, VC for direction feet pointing, posture, and pace.     - with backward gait, working on starting/stopping without assistance   - high knee marching   * Holding wall:   squats, heel raises, hip abdct / add; hip extension * Seated on bench, with feet on blue step-  STS x 7 with UE on white barbell and minA from therapist to steady;  * returned to forward walking holding white barbell and CGA/SBA, cues for upright posture. Working on change in speed.   Pt requires buoyancy for support  and to offload joints with strengthening exercises. Viscosity of the water is needed for resistance of strengthening; water current perturbations provides challenge to standing balance unsupported, requiring increased core activation.   PATIENT EDUCATION:  Education details: exam findings, POC, HEP, education on general process of water therapy/transition to land  Person educated: Patient and Child(ren) Education method: Explanation Education comprehension: verbalized understanding and needs further education   HOME EXERCISE PROGRAM: Will be assigned next land session   ASSESSMENT:  CLINICAL IMPRESSION:  Supervising PT, FDenton Meek assessed rash on pt's leg; ok to get in water today, but if it gets worse or doesn't improve prior to next session, pt is to seek medical attention for it. Pt and his daughter-in-law verbalized understanding.  Pt requires presence of therapist in pool at all times due to decreased balance and decreased confidence in water. Pt demonstrated improved confidence walking in water with use of water walker.  He did not have as much posterior lean with gait has he has in previous sessions. He complained of headache and increased heart rate after increased speed in forward gait; eased with standing rest break against pool wall prior to exiting pool. Will benefit from skilled PT services to address functional limitations, reduce fall risk, and optimize overall level of function.Pt progressing gradually towards goals.     OBJECTIVE IMPAIRMENTS Abnormal gait, cardiopulmonary status limiting activity, decreased activity tolerance, decreased balance, decreased coordination, decreased endurance, decreased knowledge of use of DME, decreased mobility, difficulty walking, decreased strength, decreased safety awareness, increased fascial restrictions, postural dysfunction, and obesity.   ACTIVITY LIMITATIONS carrying, lifting, bending, standing, squatting, stairs, transfers, bed  mobility, bathing, locomotion level, and caring for others  PARTICIPATION LIMITATIONS: cleaning, shopping, and community activity  PERSONAL FACTORS Age, Behavior pattern, Education, Fitness, Social background, and Time since onset of injury/illness/exacerbation are also affecting patient's functional outcome.   REHAB POTENTIAL: Good  CLINICAL DECISION MAKING: Stable/uncomplicated  EVALUATION COMPLEXITY: Low   GOALS: Goals reviewed with patient? Yes  SHORT TERM GOALS: Target date: 05/31/2022  Will be compliant with appropriate HEP  Baseline: Goal status: INITIAL  2.  Will be able to name 3 ways to reduce fall risk at home and in the community  Baseline:  Goal status: INITIAL  3.  Will be able to complete 5x sit to stand  test in no more than 20 seconds  Baseline:  Goal status: INITIAL  4.  Will be able to complete all functional transfers safely with good hand placement, general sequencing, and safety awareness  Baseline:  Goal status: INITIAL   LONG TERM GOALS: Target date: 06/28/2022   MMT to improve by at least one grade in all weak groups  Baseline:  Goal status: INITIAL  2.  Will score at least 48 on the Berg to show reduced fall risk  Baseline:  Goal status: INITIAL  3.  Will be able to ambulate at least 553f on 3MWT with LRAD to show improved functional activity tolerance  Baseline:  Goal status: INITIAL  4.  Will be compliant with regular progressive walking program at home  Baseline:  Goal status: INITIAL  5.  Will be compliant with gym based vs advanced HEP at DC to maintain functional gains/prevent recurrence of deconditioning  Baseline:  Goal status: INITIAL    PLAN: PT FREQUENCY: 2x/week  PT DURATION: 8 weeks  PLANNED INTERVENTIONS: Therapeutic exercises, Therapeutic activity, Neuromuscular re-education, Balance training, Gait training, Patient/Family education, Joint mobilization, Stair training, DME instructions, Aquatic Therapy, Cognitive  remediation, Cryotherapy, Moist heat, Taping, Ultrasound, Ionotophoresis '4mg'$ /ml Dexamethasone, Manual therapy, and Re-evaluation  PLAN FOR NEXT SESSION: still needs Berg; work on strength, balance, functional activity tolerance. First 4 weeks of POC in water, second 4 weeks we will transition to land therapy   JKerin Perna PTA 05/03/22 2:24 PM

## 2022-05-05 ENCOUNTER — Ambulatory Visit (HOSPITAL_BASED_OUTPATIENT_CLINIC_OR_DEPARTMENT_OTHER): Payer: Medicare Other | Admitting: Physical Therapy

## 2022-05-05 ENCOUNTER — Encounter (HOSPITAL_BASED_OUTPATIENT_CLINIC_OR_DEPARTMENT_OTHER): Payer: Self-pay | Admitting: Physical Therapy

## 2022-05-05 DIAGNOSIS — M6281 Muscle weakness (generalized): Secondary | ICD-10-CM

## 2022-05-05 DIAGNOSIS — R2681 Unsteadiness on feet: Secondary | ICD-10-CM | POA: Diagnosis not present

## 2022-05-05 DIAGNOSIS — R2689 Other abnormalities of gait and mobility: Secondary | ICD-10-CM

## 2022-05-05 NOTE — Therapy (Signed)
OUTPATIENT PHYSICAL THERAPY LOWER EXTREMITY TREATMENT NOTE   Patient Name: Glen [name] MRN: [id number] DOB:06/02/1928, 88 y.o., male Today's Date: 05/05/2022   PT End of Session - 05/05/22 0936     Visit Number 6    Number of Visits 17    Date for PT Re-Evaluation 05/31/22    Authorization Time Period 04/05/22 to 05/31/22    PT Start Time 0945    PT Stop Time 1030    PT Time Calculation (min) 45 min    Activity Tolerance Patient tolerated treatment well    Behavior During Therapy [facility] for tasks assessed/performed             Past Medical History:  Diagnosis Date   Atrial fibrillation ([place])    Diverticulitis    Esophageal dysmotility    Family history of diabetes insipidus    FHx: colon cancer    H. pylori infection    Hiatal hernia    History of colonic polyps    Hyperlipidemia    Hypertension    IDA (iron deficiency anemia)    Neoplasm of prostate    special screening malignant    Obesity    Palpitation    Peptic ulcer disease    Presbyesophagus    Schatzki's ring    Past Surgical History:  Procedure Laterality Date   2-D Echocardiogram  November 2009, 2011   Anal Fistula Repair     BALLOON DILATION N/A 01/17/2022   Procedure: [place];  Surgeon: Jackquline Denmark, MD;  Location: [facility];  Service: Gastroenterology;  Laterality: N/A;   BIOPSY  07/14/2020   Procedure: BIOPSY;  Surgeon: [name], MD;  Location: [facility] ENDOSCOPY;  Service: Gastroenterology;;   BIOPSY  01/17/2022   Procedure: BIOPSY;  Surgeon: Jackquline Denmark, MD;  Location: [facility] ENDOSCOPY;  Service: Gastroenterology;;   CARDIAC VALVE SURGERY  1996   status post balloon valvuloplasty at [place]  2004   COLONOSCOPY  December 2005   ESOPHAGOGASTRODUODENOSCOPY  07/14/2020   ESOPHAGOGASTRODUODENOSCOPY N/A 01/17/2022   Procedure: ESOPHAGOGASTRODUODENOSCOPY (EGD);  Surgeon: Jackquline Denmark, MD;  Location: [facility];  Service: Gastroenterology;  Laterality: N/A;    ESOPHAGOGASTRODUODENOSCOPY (EGD) WITH PROPOFOL N/A 07/14/2020   Procedure: ESOPHAGOGASTRODUODENOSCOPY (EGD) WITH PROPOFOL;  Surgeon: [name], MD;  Location: [facility] ENDOSCOPY;  Service: Gastroenterology;  Laterality: N/A;   ETT  1991   Negative   HEMOSTASIS CLIP PLACEMENT  07/14/2020   HEMOSTASIS CLIP PLACEMENT   HEMOSTASIS CLIP PLACEMENT  07/14/2020   Procedure: HEMOSTASIS CLIP PLACEMENT;  Surgeon: [name], MD;  Location: [facility] ENDOSCOPY;  Service: Gastroenterology;;   KNEE ARTHROSCOPY     Right   TONSILLECTOMY     Patient Active Problem List   Diagnosis Date Noted   Physical deconditioning 01/16/2022   Dehydration 01/16/2022   Peptic ulcer disease    Fall 07/13/2020   Osteoarthritis 06/25/2019   Onychomycosis 07/29/2018   Esophageal dysphagia 07/29/2018   Peripheral edema 11/18/2015   Impaired glucose tolerance 04/19/2015   Essential hypertension 01/20/2009   Atrial fibrillation, chronic ([place]) 03/13/2008   Dyslipidemia 07/31/2007   History of colonic polyps 07/31/2007    PCP: Dimas Chyle   REFERRING PROVIDER: Buford Dresser, MD   REFERRING DIAG: R53.81 (ICD-10-CM) - Physical deconditioning   THERAPY DIAG:  Muscle weakness (generalized)  Unsteadiness on feet  Other abnormalities of gait and mobility  Rationale for Evaluation and Treatment Rehabilitation  ONSET DATE: 02/02/2022   SUBJECTIVE:   SUBJECTIVE STATEMENT:  Pt  reports that he walked in yard and did some exercises at counter since last visit.   He states he noticed a rash on his Rt leg today; doesn't remember any contact with anything irritating.  (Observed to have rash on lateral and posterior Rt lower leg)     PERTINENT HISTORY: PMH: HTN, Afib, OA, HLD, hernia, IDA, neoplasm of prostate, cardiac valve surgery, knee arthroscopy  Glen [name] is a 88 y.o. male with a hx of hypertension, hyperlidemia and permanent AF, previously on coumadin but now on no anticoagulation who is seen for a  post hospital visit.    At his last appointment I reviewed his 07/2020 hospitalization. He was seen for a 7 day transition of care visit. All medications reviewed, as well as review of hospital course and discharge summary. Patient's main concern was for over the counter medications for sinus congestion, allergies, pain, and sleep. He was told that he could not take anything until this was cleared by a doctor. We discussed this at length. Discussed absolute avoidance of NSAIDs. [place] for physical therapy, etc from a heart perspective. Reviewed 07/2020 hospitalization for GI bleed. ECG 07/14/20 afib RVR with PVC. Only cardiac medication at that visit was metoprolol, and heart rate was 95 bpm. I discussed changing to extended release metoprolol but he declined and would prefer to keep tartrate, as he said this was what Dr. Claiborne Billings had recommended to him. He was difficult to keep on task during the interview. He had chronic LE edema, which he endorsed is from the metoprolol. We discussed chronic venous insufficiency in depth. Could not feel his afib.     PAIN:  Are you having pain? No Location:  Pain rating:  Description:  Aggravating factors:  lower back / buttocks f sitting on hard surface Alleviating factors: medicine   PRECAUTIONS: Fall  WEIGHT BEARING RESTRICTIONS No  FALLS:  Has patient fallen in last 6 months? No  LIVING ENVIRONMENT: Lives with: lives with their spouse but son is 86 minutes away  Lives in: House/apartment Stairs: Yes: Internal: 4 steps; can reach both does not have to go upstairs inside the home  Has following equipment at home: Gilford Rile - 2 wheeled and [employer] - 4 wheeled, shower chair, grab bars   OCCUPATION: retired   PLOF: Independent, Independent with basic ADLs, Independent with household mobility with device, and Requires assistive device for independence  PATIENT GOALS build endurance    OBJECTIVE:  * findings taken at evaluation unless otherwise  noted  DIAGNOSTIC FINDINGS:   PATIENT SURVEYS:    COGNITION:  Overall cognitive status:  very tangential and hard to keep on track          POSTURE: rounded shoulders, forward head, decreased lumbar lordosis, increased thoracic kyphosis, and flexed trunk     LOWER EXTREMITY MMT:  MMT Right eval Left eval  Hip flexion 3+ 3+  Hip extension    Hip abduction 3 (sitting) 3 (sitting)  Hip adduction    Hip internal rotation    Hip external rotation    Knee flexion 4 4  Knee extension 4+ 4+  Ankle dorsiflexion 4- 4-  Ankle plantarflexion    Ankle inversion    Ankle eversion     (Blank rows = not tested)   FUNCTIONAL TESTS:  5 times sit to stand: 32 seconds had to use BUEs, very poor eccentric control and often "plopped" onto the table  3 minute walk test: 344f rollator no rest breaks  Tandem stance: unable  to hold longer than 9 seconds without BUE support or assist from PT   GAIT: Distance walked: 3108f Assistive device utilized: WEnvironmental consultant- 4 wheeled Level of assistance: SBA Comments: flexed trunk, tends to push rollator a bit far ahead of himself, slow pace, really easily fatigued     TODAY'S TREATMENT: Pt seen for aquatic therapy today.  Water 3.25-4.8 ft in depth. Temp of water was 91.  Pt entered/exited the pool via stairs step to with SBA using bilat handrails.  * in water walker and intermittent CGA to walker/ SBA:    -Multiple laps of forward gait, backward gait, side stepping, VC for direction feet pointing, posture, and pace.     - with backward gait, working on starting/stopping without assistance   - high knee marching   * Holding wall:   squats, heel raises, hip abdct / add; hip extension * Seated on bench, with feet on blue step-  STS x 7 with UE on white barbell and minA from therapist to steady;  * returned to forward walking holding white barbell and CGA/SBA, cues for upright posture. Working on change in speed.   Pt requires buoyancy for support  and to offload joints with strengthening exercises. Viscosity of the water is needed for resistance of strengthening; water current perturbations provides challenge to standing balance unsupported, requiring increased core activation.   PATIENT EDUCATION:  Education details: exam findings, POC, HEP, education on general process of water therapy/transition to land  Person educated: Patient and Child(ren) Education method: Explanation Education comprehension: verbalized understanding and needs further education   HOME EXERCISE PROGRAM: Will be assigned next land session   ASSESSMENT:  CLINICAL IMPRESSION: Rash dry and improving. Majority of amb completed with white foam barbel.  Pt with good dynamic standing balance with forward motion. Cues and min assist needed for backward to slow pace and improve control.  Mod vc and assist with STS to counter posterior lean.  Pt has difficulty correcting.  He will benefit from skilled PT services to address functional limitations, reduce fall risk, and optimize overall level of function.Pt progressing gradually towards goals.    Pt requires presence of therapist in pool at all times due to decreased balance and decreased confidence in water.    OBJECTIVE IMPAIRMENTS Abnormal gait, cardiopulmonary status limiting activity, decreased activity tolerance, decreased balance, decreased coordination, decreased endurance, decreased knowledge of use of DME, decreased mobility, difficulty walking, decreased strength, decreased safety awareness, increased fascial restrictions, postural dysfunction, and obesity.   ACTIVITY LIMITATIONS carrying, lifting, bending, standing, squatting, stairs, transfers, bed mobility, bathing, locomotion level, and caring for others  PARTICIPATION LIMITATIONS: cleaning, shopping, and community activity  PERSONAL FACTORS Age, Behavior pattern, Education, Fitness, Social background, and Time since onset of injury/illness/exacerbation  are also affecting patient's functional outcome.   REHAB POTENTIAL: Good  CLINICAL DECISION MAKING: Stable/uncomplicated  EVALUATION COMPLEXITY: Low   GOALS: Goals reviewed with patient? Yes  SHORT TERM GOALS: Target date: 06/02/2022  Will be compliant with appropriate HEP  Baseline: Goal status: INITIAL  2.  Will be able to name 3 ways to reduce fall risk at home and in the community  Baseline:  Goal status: INITIAL  3.  Will be able to complete 5x sit to stand test in no more than 20 seconds  Baseline:  Goal status: INITIAL  4.  Will be able to complete all functional transfers safely with good hand placement, general sequencing, and safety awareness  Baseline:  Goal status: INITIAL   LONG  TERM GOALS: Target date: 06/30/2022   MMT to improve by at least one grade in all weak groups  Baseline:  Goal status: INITIAL  2.  Will score at least 48 on the Berg to show reduced fall risk  Baseline:  Goal status: INITIAL  3.  Will be able to ambulate at least 523f on 3MWT with LRAD to show improved functional activity tolerance  Baseline:  Goal status: INITIAL  4.  Will be compliant with regular progressive walking program at home  Baseline:  Goal status: INITIAL  5.  Will be compliant with gym based vs advanced HEP at DC to maintain functional gains/prevent recurrence of deconditioning  Baseline:  Goal status: INITIAL    PLAN: PT FREQUENCY: 2x/week  PT DURATION: 8 weeks  PLANNED INTERVENTIONS: Therapeutic exercises, Therapeutic activity, Neuromuscular re-education, Balance training, Gait training, Patient/Family education, Joint mobilization, Stair training, DME instructions, Aquatic Therapy, Cognitive remediation, Cryotherapy, Moist heat, Taping, Ultrasound, Ionotophoresis '4mg'$ /ml Dexamethasone, Manual therapy, and Re-evaluation  PLAN FOR NEXT SESSION: still needs Berg; work on strength, balance, functional activity tolerance. First 4 weeks of POC in water, second  4 weeks we will transition to land therapy   MStanton Kidney(Tharon Aquas Jacorian Golaszewski MPT 05/05/22 1:17 PM                           OUTPATIENT PHYSICAL THERAPY LOWER EXTREMITY TREATMENT NOTE   Patient Name: Glen DANIELSONMRN: [id number]:511/15/1929 88y.o., male Today's Date: 05/05/2022     Past Medical History:  Diagnosis Date   Atrial fibrillation ([place]    Diverticulitis    Esophageal dysmotility    Family history of diabetes insipidus    FHx: colon cancer    H. pylori infection    Hiatal hernia    History of colonic polyps    Hyperlipidemia    Hypertension    IDA (iron deficiency anemia)    Neoplasm of prostate    special screening malignant    Obesity    Palpitation    Peptic ulcer disease    Presbyesophagus    Schatzki's ring    Past Surgical History:  Procedure Laterality Date   2-D Echocardiogram  November 2009, 2011   Anal Fistula Repair     BALLOON DILATION N/A 01/17/2022   Procedure: BStacie Acres  Surgeon: GJackquline Denmark MD;  [facility];  Service: Gastroenterology;  Laterality: N/A;   BIOPSY  07/14/2020   Procedure: BIOPSY;  Surgeon: PJerene Bears MD;  [facility]  Service: Gastroenterology;;   BIOPSY  01/17/2022   Procedure: BIOPSY;  Surgeon: GJackquline Denmark MD;  [facility];  Service: Gastroenterology;;   CARDIAC VALVE SURGERY  1996   [place]   COLONOSCOPY  December 2005   ESOPHAGOGASTRODUODENOSCOPY  07/14/2020   ESOPHAGOGASTRODUODENOSCOPY N/A 01/17/2022   Procedure: ESOPHAGOGASTRODUODENOSCOPY (EGD);  Surgeon: GJackquline Denmark MD;  [facility];  Service: Gastroenterology;  Laterality: N/A;   ESOPHAGOGASTRODUODENOSCOPY (EGD) WITH PROPOFOL N/A 07/14/2020   Procedure: ESOPHAGOGASTRODUODENOSCOPY (EGD) WITH PROPOFOL;  Surgeon: PJerene Bears MD;  [facility]  Service: Gastroenterology;  Laterality: N/A;   ETT  1991   Negative    HEMOSTASIS CLIP PLACEMENT  07/14/2020   HEMOSTASIS CLIP PLACEMENT   HEMOSTASIS CLIP PLACEMENT  07/14/2020   Procedure: HEMOSTASIS CLIP PLACEMENT;  Surgeon: PJerene Bears MD;  [facility];  Service: Gastroenterology;;   KNEE ARTHROSCOPY  Right   TONSILLECTOMY     Patient Active Problem List   Diagnosis Date Noted   Physical deconditioning 01/16/2022   Dehydration 01/16/2022   Peptic ulcer disease    Fall 07/13/2020   Osteoarthritis 06/25/2019   Onychomycosis 07/29/2018   Esophageal dysphagia 07/29/2018   Peripheral edema 11/18/2015   Impaired glucose tolerance 04/19/2015   Essential hypertension 01/20/2009   Atrial fibrillation, chronic ([place]) 03/13/2008   Dyslipidemia 07/31/2007   History of colonic polyps 07/31/2007    PCP: Dimas Chyle   REFERRING PROVIDER: Buford Dresser, MD   REFERRING DIAG: R53.81 (ICD-10-CM) - Physical deconditioning   THERAPY DIAG:  No diagnosis found.  Rationale for Evaluation and Treatment Rehabilitation  ONSET DATE: 02/02/2022   SUBJECTIVE:   SUBJECTIVE STATEMENT:  Pt reports that he walked in yard and did some exercises at counter since last visit.   He states he noticed a rash on his Rt leg today; doesn't remember any contact with anything irritating.  (Observed to have rash on lateral and posterior Rt lower leg)     PERTINENT HISTORY: PMH: HTN, Afib, OA, HLD, hernia, IDA, neoplasm of prostate, cardiac valve surgery, knee arthroscopy  Glen [name] is a 88 y.o. male with a hx of hypertension, hyperlidemia and permanent AF, previously on coumadin but now on no anticoagulation who is seen for a post hospital visit.    At his last appointment I reviewed his 07/2020 hospitalization. He was seen for a 7 day transition of care visit. All medications reviewed, as well as review of hospital course and discharge summary. Patient's main concern was for over the counter medications for sinus congestion, allergies, pain,  and sleep. He was told that he could not take anything until this was cleared by a doctor. We discussed this at length. Discussed absolute avoidance of NSAIDs. [place] for physical therapy, etc from a heart perspective. Reviewed 07/2020 hospitalization for GI bleed. ECG 07/14/20 afib RVR with PVC. Only cardiac medication at that visit was metoprolol, and heart rate was 95 bpm. I discussed changing to extended release metoprolol but he declined and would prefer to keep tartrate, as he said this was what Dr. Claiborne Billings had recommended to him. He was difficult to keep on task during the interview. He had chronic LE edema, which he endorsed is from the metoprolol. We discussed chronic venous insufficiency in depth. Could not feel his afib.     PAIN:  Are you having pain? No Location:  Pain rating:  Description:  Aggravating factors:  lower back / buttocks f sitting on hard surface Alleviating factors: medicine   PRECAUTIONS: Fall  WEIGHT BEARING RESTRICTIONS No  FALLS:  Has patient fallen in last 6 months? No  LIVING ENVIRONMENT: Lives with: lives with their spouse but son is 86 minutes away  Lives in: House/apartment Stairs: Yes: Internal: 4 steps; can reach both does not have to go upstairs inside the home  Has following equipment at home: Gilford Rile - 2 wheeled and [employer] - 4 wheeled, shower chair, grab bars   OCCUPATION: retired   PLOF: Independent, Independent with basic ADLs, Independent with household mobility with device, and Requires assistive device for independence  PATIENT GOALS build endurance    OBJECTIVE:  * findings taken at evaluation unless otherwise noted  DIAGNOSTIC FINDINGS:   PATIENT SURVEYS:    COGNITION:  Overall cognitive status:  very tangential and hard to keep on track          POSTURE: rounded shoulders, forward head, decreased  lumbar lordosis, increased thoracic kyphosis, and flexed trunk     LOWER EXTREMITY MMT:  MMT Right eval Left eval  Hip flexion  3+ 3+  Hip extension    Hip abduction 3 (sitting) 3 (sitting)  Hip adduction    Hip internal rotation    Hip external rotation    Knee flexion 4 4  Knee extension 4+ 4+  Ankle dorsiflexion 4- 4-  Ankle plantarflexion    Ankle inversion    Ankle eversion     (Blank rows = not tested)   FUNCTIONAL TESTS:  5 times sit to stand: 32 seconds had to use BUEs, very poor eccentric control and often "plopped" onto the table  3 minute walk test: 357f rollator no rest breaks  Tandem stance: unable to hold longer than 9 seconds without BUE support or assist from PT   GAIT: Distance walked: 3278fAssistive device utilized: WaEnvironmental consultant 4 wheeled Level of assistance: SBA Comments: flexed trunk, tends to push rollator a bit far ahead of himself, slow pace, really easily fatigued     TODAY'S TREATMENT: Pt seen for aquatic therapy today.  Water 3.25-4.8 ft in depth. Temp of water was 91.  Pt entered/exited the pool via stairs step to with SBA using bilat handrails.  * in water walker and intermittent CGA to walker/ SBA:    -Multiple laps of forward gait, backward gait, side stepping, VC for direction feet pointing, posture, and pace.     - with backward gait, working on starting/stopping without assistance   - high knee marching   * Holding wall:   squats, heel raises, hip abdct / add; hip extension * Seated on bench, with feet on blue step-  STS x 7 with UE on white barbell and minA from therapist to steady;  * returned to forward walking holding white barbell and CGA/SBA, cues for upright posture. Working on change in speed.   Pt requires buoyancy for support and to offload joints with strengthening exercises. Viscosity of the water is needed for resistance of strengthening; water current perturbations provides challenge to standing balance unsupported, requiring increased core activation.   PATIENT EDUCATION:  Education details: exam findings, POC, HEP, education on general process of water  therapy/transition to land  Person educated: Patient and Child(ren) Education method: Explanation Education comprehension: verbalized understanding and needs further education   HOME EXERCISE PROGRAM: Will be assigned next land session   ASSESSMENT:  CLINICAL IMPRESSION:  Supervising PT, FrDenton Meekassessed rash on pt's leg; ok to get in water today, but if it gets worse or doesn't improve prior to next session, pt is to seek medical attention for it. Pt and his daughter-in-law verbalized understanding.  Pt requires presence of therapist in pool at all times due to decreased balance and decreased confidence in water. Pt demonstrated improved confidence walking in water with use of water walker.  He did not have as much posterior lean with gait has he has in previous sessions. He complained of headache and increased heart rate after increased speed in forward gait; eased with standing rest break against pool wall prior to exiting pool. Will benefit from skilled PT services to address functional limitations, reduce fall risk, and optimize overall level of function.Pt progressing gradually towards goals.     OBJECTIVE IMPAIRMENTS Abnormal gait, cardiopulmonary status limiting activity, decreased activity tolerance, decreased balance, decreased coordination, decreased endurance, decreased knowledge of use of DME, decreased mobility, difficulty walking, decreased strength, decreased safety awareness, increased fascial restrictions, postural  dysfunction, and obesity.   ACTIVITY LIMITATIONS carrying, lifting, bending, standing, squatting, stairs, transfers, bed mobility, bathing, locomotion level, and caring for others  PARTICIPATION LIMITATIONS: cleaning, shopping, and community activity  PERSONAL FACTORS Age, Behavior pattern, Education, Fitness, Social background, and Time since onset of injury/illness/exacerbation are also affecting patient's functional outcome.   REHAB POTENTIAL:  Good  CLINICAL DECISION MAKING: Stable/uncomplicated  EVALUATION COMPLEXITY: Low   GOALS: Goals reviewed with patient? Yes  SHORT TERM GOALS: Target date: 06/02/2022  Will be compliant with appropriate HEP  Baseline: Goal status: INITIAL  2.  Will be able to name 3 ways to reduce fall risk at home and in the community  Baseline:  Goal status: INITIAL  3.  Will be able to complete 5x sit to stand test in no more than 20 seconds  Baseline:  Goal status: INITIAL  4.  Will be able to complete all functional transfers safely with good hand placement, general sequencing, and safety awareness  Baseline:  Goal status: INITIAL   LONG TERM GOALS: Target date: 06/30/2022   MMT to improve by at least one grade in all weak groups  Baseline:  Goal status: INITIAL  2.  Will score at least 48 on the Berg to show reduced fall risk  Baseline:  Goal status: INITIAL  3.  Will be able to ambulate at least 566f on 3MWT with LRAD to show improved functional activity tolerance  Baseline:  Goal status: INITIAL  4.  Will be compliant with regular progressive walking program at home  Baseline:  Goal status: INITIAL  5.  Will be compliant with gym based vs advanced HEP at DC to maintain functional gains/prevent recurrence of deconditioning  Baseline:  Goal status: INITIAL    PLAN: PT FREQUENCY: 2x/week  PT DURATION: 8 weeks  PLANNED INTERVENTIONS: Therapeutic exercises, Therapeutic activity, Neuromuscular re-education, Balance training, Gait training, Patient/Family education, Joint mobilization, Stair training, DME instructions, Aquatic Therapy, Cognitive remediation, Cryotherapy, Moist heat, Taping, Ultrasound, Ionotophoresis '4mg'$ /ml Dexamethasone, Manual therapy, and Re-evaluation  PLAN FOR NEXT SESSION: still needs Berg; work on strength, balance, functional activity tolerance. First 4 weeks of POC in water, second 4 weeks we will transition to land therapy   MStanton Kidney(Tharon Aquas Rhianna Raulerson  MPT  05/05/22 9:31 AM

## 2022-05-09 ENCOUNTER — Ambulatory Visit (HOSPITAL_BASED_OUTPATIENT_CLINIC_OR_DEPARTMENT_OTHER): Payer: Medicare Other | Admitting: Physical Therapy

## 2022-05-09 DIAGNOSIS — R2689 Other abnormalities of gait and mobility: Secondary | ICD-10-CM

## 2022-05-09 DIAGNOSIS — M6281 Muscle weakness (generalized): Secondary | ICD-10-CM

## 2022-05-09 DIAGNOSIS — R2681 Unsteadiness on feet: Secondary | ICD-10-CM

## 2022-05-09 NOTE — Therapy (Signed)
OUTPATIENT PHYSICAL THERAPY LOWER EXTREMITY TREATMENT NOTE   Patient Name: Glen [name] MRN: [id number] DOB:September 03, 1928, 88 y.o., male Today's Date: 05/09/2022   PT End of Session - 05/09/22 1023     Visit Number 7    Number of Visits 17    Date for PT Re-Evaluation 05/31/22    Authorization Time Period 04/05/22 to 05/31/22    Progress Note Due on Visit 10    PT Start Time 1020    PT Stop Time 1100    PT Time Calculation (min) 40 min    Activity Tolerance Patient tolerated treatment well    Behavior During Therapy [facility] for tasks assessed/performed             Past Medical History:  Diagnosis Date   Atrial fibrillation ([place])    Diverticulitis    Esophageal dysmotility    Family history of diabetes insipidus    FHx: colon cancer    H. pylori infection    Hiatal hernia    History of colonic polyps    Hyperlipidemia    Hypertension    IDA (iron deficiency anemia)    Neoplasm of prostate    special screening malignant    Obesity    Palpitation    Peptic ulcer disease    Presbyesophagus    Schatzki's ring    Past Surgical History:  Procedure Laterality Date   2-D Echocardiogram  November 2009, 2011   Anal Fistula Repair     BALLOON DILATION N/A 01/17/2022   Procedure: [place];  Surgeon: Jackquline Denmark, MD;  Location: [facility];  Service: Gastroenterology;  Laterality: N/A;   BIOPSY  07/14/2020   Procedure: BIOPSY;  Surgeon: [name], MD;  Location: [facility] ENDOSCOPY;  Service: Gastroenterology;;   BIOPSY  01/17/2022   Procedure: BIOPSY;  Surgeon: Jackquline Denmark, MD;  Location: [facility] ENDOSCOPY;  Service: Gastroenterology;;   CARDIAC VALVE SURGERY  1996   status post balloon valvuloplasty at [place]  2004   COLONOSCOPY  December 2005   ESOPHAGOGASTRODUODENOSCOPY  07/14/2020   ESOPHAGOGASTRODUODENOSCOPY N/A 01/17/2022   Procedure: ESOPHAGOGASTRODUODENOSCOPY (EGD);  Surgeon: Jackquline Denmark, MD;  Location: [facility];  Service:  Gastroenterology;  Laterality: N/A;   ESOPHAGOGASTRODUODENOSCOPY (EGD) WITH PROPOFOL N/A 07/14/2020   Procedure: ESOPHAGOGASTRODUODENOSCOPY (EGD) WITH PROPOFOL;  Surgeon: [name], MD;  Location: [facility] ENDOSCOPY;  Service: Gastroenterology;  Laterality: N/A;   ETT  1991   Negative   HEMOSTASIS CLIP PLACEMENT  07/14/2020   HEMOSTASIS CLIP PLACEMENT   HEMOSTASIS CLIP PLACEMENT  07/14/2020   Procedure: HEMOSTASIS CLIP PLACEMENT;  Surgeon: [name], MD;  Location: [facility] ENDOSCOPY;  Service: Gastroenterology;;   KNEE ARTHROSCOPY     Right   TONSILLECTOMY     Patient Active Problem List   Diagnosis Date Noted   Physical deconditioning 01/16/2022   Dehydration 01/16/2022   Peptic ulcer disease    Fall 07/13/2020   Osteoarthritis 06/25/2019   Onychomycosis 07/29/2018   Esophageal dysphagia 07/29/2018   Peripheral edema 11/18/2015   Impaired glucose tolerance 04/19/2015   Essential hypertension 01/20/2009   Atrial fibrillation, chronic ([place]) 03/13/2008   Dyslipidemia 07/31/2007   History of colonic polyps 07/31/2007    PCP: Dimas Chyle   REFERRING PROVIDER: Buford Dresser, MD   REFERRING DIAG: R53.81 (ICD-10-CM) - Physical deconditioning   THERAPY DIAG:  Muscle weakness (generalized)  Unsteadiness on feet  Other abnormalities of gait and mobility  Rationale for Evaluation and Treatment Rehabilitation  ONSET DATE: 02/02/2022  SUBJECTIVE:   SUBJECTIVE STATEMENT:  Pt reports that he walked in yard and did some exercises at counter since last visit.   He states he noticed a rash on his Rt leg today; doesn't remember any contact with anything irritating.  (Observed to have rash on lateral and posterior Rt lower leg)     PERTINENT HISTORY: PMH: HTN, Afib, OA, HLD, hernia, IDA, neoplasm of prostate, cardiac valve surgery, knee arthroscopy  Glen [name] is a 88 y.o. male with a hx of hypertension, hyperlidemia and permanent AF, previously on coumadin but now  on no anticoagulation who is seen for a post hospital visit.    At his last appointment I reviewed his 07/2020 hospitalization. He was seen for a 7 day transition of care visit. All medications reviewed, as well as review of hospital course and discharge summary. Patient's main concern was for over the counter medications for sinus congestion, allergies, pain, and sleep. He was told that he could not take anything until this was cleared by a doctor. We discussed this at length. Discussed absolute avoidance of NSAIDs. [place] for physical therapy, etc from a heart perspective. Reviewed 07/2020 hospitalization for GI bleed. ECG 07/14/20 afib RVR with PVC. Only cardiac medication at that visit was metoprolol, and heart rate was 95 bpm. I discussed changing to extended release metoprolol but he declined and would prefer to keep tartrate, as he said this was what Dr. Claiborne Billings had recommended to him. He was difficult to keep on task during the interview. He had chronic LE edema, which he endorsed is from the metoprolol. We discussed chronic venous insufficiency in depth. Could not feel his afib.     PAIN:  Are you having pain? No Location:  Pain rating:  Description:  Aggravating factors:  lower back / buttocks f sitting on hard surface Alleviating factors: medicine   PRECAUTIONS: Fall  WEIGHT BEARING RESTRICTIONS No  FALLS:  Has patient fallen in last 6 months? No  LIVING ENVIRONMENT: Lives with: lives with their spouse but son is 10 minutes away  Lives in: House/apartment Stairs: Yes: Internal: 4 steps; can reach both does not have to go upstairs inside the home  Has following equipment at home: Gilford Rile - 2 wheeled and [employer] - 4 wheeled, shower chair, grab bars   OCCUPATION: retired   PLOF: Independent, Independent with basic ADLs, Independent with household mobility with device, and Requires assistive device for independence  PATIENT GOALS build endurance    OBJECTIVE:  * findings taken at  evaluation unless otherwise noted  DIAGNOSTIC FINDINGS:   PATIENT SURVEYS:    COGNITION:  Overall cognitive status:  very tangential and hard to keep on track          POSTURE: rounded shoulders, forward head, decreased lumbar lordosis, increased thoracic kyphosis, and flexed trunk     LOWER EXTREMITY MMT:  MMT Right eval Left eval  Hip flexion 3+ 3+  Hip extension    Hip abduction 3 (sitting) 3 (sitting)  Hip adduction    Hip internal rotation    Hip external rotation    Knee flexion 4 4  Knee extension 4+ 4+  Ankle dorsiflexion 4- 4-  Ankle plantarflexion    Ankle inversion    Ankle eversion     (Blank rows = not tested)   FUNCTIONAL TESTS:  5 times sit to stand: 32 seconds had to use BUEs, very poor eccentric control and often "plopped" onto the table  3 minute walk test: 329f rollator  no rest breaks  Tandem stance: unable to hold longer than 9 seconds without BUE support or assist from PT   GAIT: Distance walked: 352f Assistive device utilized: WEnvironmental consultant- 4 wheeled Level of assistance: SBA Comments: flexed trunk, tends to push rollator a bit far ahead of himself, slow pace, really easily fatigued     TODAY'S TREATMENT: Pt seen for aquatic therapy today.  Water 3.25-4.8 ft in depth. Temp of water was 91.  Pt entered/exited the pool via stairs step to with SBA using bilat handrails.  * in water walker and intermittent CGA to walker/ SBA:    -Multiple laps of forward gait, backward gait, side stepping, VC for direction feet pointing, posture, and pace.     - with backward gait, working on starting/stopping without assistance   - high knee marching   * Holding wall:   squats, heel raises, hip abdct / add; hip extension * Seated on bench, with feet on blue step-  STS x 7 with UE on white barbell and minA from therapist to steady;  * returned to forward walking holding white barbell and CGA/SBA, cues for upright posture. Working on change in speed.   Pt  requires buoyancy for support and to offload joints with strengthening exercises. Viscosity of the water is needed for resistance of strengthening; water current perturbations provides challenge to standing balance unsupported, requiring increased core activation.   PATIENT EDUCATION:  Education details: exam findings, POC, HEP, education on general process of water therapy/transition to land  Person educated: Patient and Child(ren) Education method: Explanation Education comprehension: verbalized understanding and needs further education   HOME EXERCISE PROGRAM: Will be assigned next land session   ASSESSMENT:  CLINICAL IMPRESSION: Rash dry and improving. Majority of amb completed with white foam barbel.  Pt with good dynamic standing balance with forward motion. Cues and min assist needed for backward to slow pace and improve control.  Mod vc and assist with STS to counter posterior lean.  Pt has difficulty correcting.  He will benefit from skilled PT services to address functional limitations, reduce fall risk, and optimize overall level of function.Pt progressing gradually towards goals.    Pt requires presence of therapist in pool at all times due to decreased balance and decreased confidence in water.    OBJECTIVE IMPAIRMENTS Abnormal gait, cardiopulmonary status limiting activity, decreased activity tolerance, decreased balance, decreased coordination, decreased endurance, decreased knowledge of use of DME, decreased mobility, difficulty walking, decreased strength, decreased safety awareness, increased fascial restrictions, postural dysfunction, and obesity.   ACTIVITY LIMITATIONS carrying, lifting, bending, standing, squatting, stairs, transfers, bed mobility, bathing, locomotion level, and caring for others  PARTICIPATION LIMITATIONS: cleaning, shopping, and community activity  PERSONAL FACTORS Age, Behavior pattern, Education, Fitness, Social background, and Time since onset of  injury/illness/exacerbation are also affecting patient's functional outcome.   REHAB POTENTIAL: Good  CLINICAL DECISION MAKING: Stable/uncomplicated  EVALUATION COMPLEXITY: Low   GOALS: Goals reviewed with patient? Yes  SHORT TERM GOALS: Target date: 06/06/2022  Will be compliant with appropriate HEP  Baseline: Goal status: INITIAL  2.  Will be able to name 3 ways to reduce fall risk at home and in the community  Baseline:  Goal status: INITIAL  3.  Will be able to complete 5x sit to stand test in no more than 20 seconds  Baseline:  Goal status: INITIAL  4.  Will be able to complete all functional transfers safely with good hand placement, general sequencing, and safety awareness  Baseline:  Goal status: INITIAL   LONG TERM GOALS: Target date: 07/04/2022   MMT to improve by at least one grade in all weak groups  Baseline:  Goal status: INITIAL  2.  Will score at least 48 on the Berg to show reduced fall risk  Baseline:  Goal status: INITIAL  3.  Will be able to ambulate at least 558f on 3MWT with LRAD to show improved functional activity tolerance  Baseline:  Goal status: INITIAL  4.  Will be compliant with regular progressive walking program at home  Baseline:  Goal status: INITIAL  5.  Will be compliant with gym based vs advanced HEP at DC to maintain functional gains/prevent recurrence of deconditioning  Baseline:  Goal status: INITIAL    PLAN: PT FREQUENCY: 2x/week  PT DURATION: 8 weeks  PLANNED INTERVENTIONS: Therapeutic exercises, Therapeutic activity, Neuromuscular re-education, Balance training, Gait training, Patient/Family education, Joint mobilization, Stair training, DME instructions, Aquatic Therapy, Cognitive remediation, Cryotherapy, Moist heat, Taping, Ultrasound, Ionotophoresis '4mg'$ /ml Dexamethasone, Manual therapy, and Re-evaluation  PLAN FOR NEXT SESSION: still needs Berg; work on strength, balance, functional activity tolerance. First 4  weeks of POC in water, second 4 weeks we will transition to land therapy   MStanton Kidney(Tharon Aquas Ziemba MPT 05/09/22 12:17 PM                           OUTPATIENT PHYSICAL THERAPY LOWER EXTREMITY TREATMENT NOTE   Patient Name: Glen BASKETTMRN: [id number]:[birth year] 88y.o., male Today's Date: 05/09/2022   PT End of Session - 05/09/22 1023     Visit Number 7    Number of Visits 17    Date for PT Re-Evaluation 05/31/22    Authorization Time Period 04/05/22 to 05/31/22    Progress Note Due on Visit 10    PT Start Time 1020    PT Stop Time 1100    PT Time Calculation (min) 40 min    Activity Tolerance Patient tolerated treatment well    [facility]              Past Medical History:  Diagnosis Date   [place]rial [place] ([place]    Diverticulitis    Esophageal dysmotility    Family history of diabetes insipidus    FHx: colon cancer    H. pylori infection    Hiatal hernia    History of colonic polyps    Hyperlipidemia    Hypertension    IDA (iron deficiency anemia)    Neoplasm of prostate    special screening malignant    Obesity    Palpitation    Peptic ulcer disease    Presbyesophagus    Schatzki's ring    Past Surgical History:  Procedure Laterality Date   2-D Echocardiogram  November 2009, 2011   Anal Fistula Repair     BALLOON DILATION N/A 01/17/2022   Procedure: BStacie Acres  Surgeon: GJackquline Denmark MD;  [facility];  Service: Gastroenterology;  Laterality: N/A;   BIOPSY  07/14/2020   Procedure: BIOPSY;  Surgeon: PJerene Bears MD;  [facility];  Service: Gastroenterology;;   BIOPSY  01/17/2022   Procedure: BIOPSY;  Surgeon: GJackquline Denmark MD;  [facility];  Service: Gastroenterology;;   CARDIAC VALVE SURGERY  1996   [place]   COLONOSCOPY  December 2005   ESOPHAGOGASTRODUODENOSCOPY   07/14/2020  ESOPHAGOGASTRODUODENOSCOPY N/A 01/17/2022   Procedure: ESOPHAGOGASTRODUODENOSCOPY (EGD);  Surgeon: Jackquline Denmark, MD;  Location: [facility];  Service: Gastroenterology;  Laterality: N/A;   ESOPHAGOGASTRODUODENOSCOPY (EGD) WITH PROPOFOL N/A 07/14/2020   Procedure: ESOPHAGOGASTRODUODENOSCOPY (EGD) WITH PROPOFOL;  Surgeon: [name], MD;  Location: [facility] ENDOSCOPY;  Service: Gastroenterology;  Laterality: N/A;   ETT  1991   Negative   HEMOSTASIS CLIP PLACEMENT  07/14/2020   HEMOSTASIS CLIP PLACEMENT   HEMOSTASIS CLIP PLACEMENT  07/14/2020   Procedure: HEMOSTASIS CLIP PLACEMENT;  Surgeon: [name], MD;  Location: [facility] ENDOSCOPY;  Service: Gastroenterology;;   KNEE ARTHROSCOPY     Right   TONSILLECTOMY     Patient Active Problem List   Diagnosis Date Noted   Physical deconditioning 01/16/2022   Dehydration 01/16/2022   Peptic ulcer disease    Fall 07/13/2020   Osteoarthritis 06/25/2019   Onychomycosis 07/29/2018   Esophageal dysphagia 07/29/2018   Peripheral edema 11/18/2015   Impaired glucose tolerance 04/19/2015   Essential hypertension 01/20/2009   Atrial fibrillation, chronic ([place]) 03/13/2008   Dyslipidemia 07/31/2007   History of colonic polyps 07/31/2007    PCP: Dimas Chyle   REFERRING PROVIDER: Buford Dresser, MD   REFERRING DIAG: R53.81 (ICD-10-CM) - Physical deconditioning   THERAPY DIAG:  Muscle weakness (generalized)  Unsteadiness on feet  Other abnormalities of gait and mobility  Rationale for Evaluation and Treatment Rehabilitation  ONSET DATE: 02/02/2022   SUBJECTIVE:   SUBJECTIVE STATEMENT: Pt's son has noticed that pt's activity tolerance has improved, "He was able to sit and visit with [place] for 3 hours the other day".   Pt reported he has been up walking more.  Rash is still present on Rt lower leg, "I've been putting apple cider vinegar on it, but ran out".      PERTINENT HISTORY: PMH: HTN, Afib, OA, HLD, hernia, IDA,  neoplasm of prostate, cardiac valve surgery, knee arthroscopy  Glen [name] is a 88 y.o. male with a hx of hypertension, hyperlidemia and permanent AF, previously on coumadin but now on no anticoagulation who is seen for a post hospital visit.    At his last appointment I reviewed his 07/2020 hospitalization. He was seen for a 7 day transition of care visit. All medications reviewed, as well as review of hospital course and discharge summary. Patient's main concern was for over the counter medications for sinus congestion, allergies, pain, and sleep. He was told that he could not take anything until this was cleared by a doctor. We discussed this at length. Discussed absolute avoidance of NSAIDs. [place] for physical therapy, etc from a heart perspective. Reviewed 07/2020 hospitalization for GI bleed. ECG 07/14/20 afib RVR with PVC. Only cardiac medication at that visit was metoprolol, and heart rate was 95 bpm. I discussed changing to extended release metoprolol but he declined and would prefer to keep tartrate, as he said this was what Dr. Claiborne Billings had recommended to him. He was difficult to keep on task during the interview. He had chronic LE edema, which he endorsed is from the metoprolol. We discussed chronic venous insufficiency in depth. Could not feel his afib.     PAIN:  Are you having pain? No Location:  Pain rating:  Description:  Aggravating factors:  lower back / buttocks f sitting on hard surface Alleviating factors: medicine   PRECAUTIONS: Fall  WEIGHT BEARING RESTRICTIONS No  FALLS:  Has patient fallen in last 6 months? No  LIVING ENVIRONMENT: Lives with: lives with their spouse but  son is 10 minutes away  Lives in: House/apartment Stairs: Yes: Internal: 4 steps; can reach both does not have to go upstairs inside the home  Has following equipment at home: Gilford Rile - 2 wheeled and [employer] - 4 wheeled, shower chair, grab bars   OCCUPATION: retired   PLOF: Independent, [place]  with basic ADLs, Independent with household mobility with device, and Requires assistive device for independence  PATIENT GOALS build endurance    OBJECTIVE:  * findings taken at evaluation unless otherwise noted  DIAGNOSTIC FINDINGS:   PATIENT SURVEYS:    COGNITION:  Overall cognitive status:  very tangential and hard to keep on track          POSTURE: rounded shoulders, forward head, decreased lumbar lordosis, increased thoracic kyphosis, and flexed trunk     LOWER EXTREMITY MMT:  MMT Right eval Left eval  Hip flexion 3+ 3+  Hip extension    Hip abduction 3 (sitting) 3 (sitting)  Hip adduction    Hip internal rotation    Hip external rotation    Knee flexion 4 4  Knee extension 4+ 4+  Ankle dorsiflexion 4- 4-  Ankle plantarflexion    Ankle inversion    Ankle eversion     (Blank rows = not tested)   FUNCTIONAL TESTS:  5 times sit to stand: 32 seconds had to use BUEs, very poor eccentric control and often "plopped" onto the table  3 minute walk test: 366f rollator no rest breaks  Tandem stance: unable to hold longer than 9 seconds without BUE support or assist from PT   GAIT: Distance walked: 3262fAssistive device utilized: WaEnvironmental consultant 4 wheeled Level of assistance: SBA Comments: flexed trunk, tends to push rollator a bit far ahead of himself, slow pace, really easily fatigued     TODAY'S TREATMENT: Pt seen for aquatic therapy today.  Water 3.5-4.75 ft in depth. Temp of water was 91.  Pt entered/exited the pool via stairs step to with SBA using bilat handrails.  * Holding white barbell  and intermittent CGA to barbell/SBA:    -Multiple laps of forward gait, backward gait, side stepping, VC for direction feet pointing, posture, and pace.     - with backward gait, working on starting/stopping without assistance   - high knee marching   * Holding wall:   squats, heel raises, hip abdct / add; hip extension * In 4.5 ft of water, no UE support + wide stance,  SBA/intermittent CGA to steady - bilatshoulder abdct/ add, bilat shoulder horiz abdct/ add, alternating shoulder flex/neutral with CGA to steady * Seated on bench, with feet on blue step-  STS x 2; and 4 with feet on floor of pool with UE on white barbell and minA from therapist to steady;    Pt requires buoyancy for support and to offload joints with strengthening exercises. Viscosity of the water is needed for resistance of strengthening; water current perturbations provides challenge to standing balance unsupported, requiring increased core activation.   PATIENT EDUCATION:  Education details: aquatics  Person educated: Patient and Child(ren) Education method: Explanation Education comprehension: verbalized understanding and needs further education   HOME EXERCISE PROGRAM: Will be assigned next land session   ASSESSMENT:  CLINICAL IMPRESSION:  Pt requires presence of therapist in pool at all times due to decreased balance and decreased confidence in water. Pt able to walk with less support - only utilizing white barbell instead of water walking and more SBA instead of CGA.  Does better  in 4+ feet of water.  Posterior lean with gait is gradually improving; still has posterior lean when moving upward into standing from seated position. No headache reported today, but some fatigue in LE reported during session.  Will benefit from skilled PT services to address functional limitations, reduce fall risk, and optimize overall level of function. Pt progressing gradually towards goals.     OBJECTIVE IMPAIRMENTS Abnormal gait, cardiopulmonary status limiting activity, decreased activity tolerance, decreased balance, decreased coordination, decreased endurance, decreased knowledge of use of DME, decreased mobility, difficulty walking, decreased strength, decreased safety awareness, increased fascial restrictions, postural dysfunction, and obesity.   ACTIVITY LIMITATIONS carrying, lifting, bending,  standing, squatting, stairs, transfers, bed mobility, bathing, locomotion level, and caring for others  PARTICIPATION LIMITATIONS: cleaning, shopping, and community activity  PERSONAL FACTORS Age, Behavior pattern, Education, Fitness, Social background, and Time since onset of injury/illness/exacerbation are also affecting patient's functional outcome.   REHAB POTENTIAL: Good  CLINICAL DECISION MAKING: Stable/uncomplicated  EVALUATION COMPLEXITY: Low   GOALS: Goals reviewed with patient? Yes  SHORT TERM GOALS: Target date: 06/06/2022  Will be compliant with appropriate HEP  Baseline: Goal status: INITIAL  2.  Will be able to name 3 ways to reduce fall risk at home and in the community  Baseline:  Goal status: INITIAL  3.  Will be able to complete 5x sit to stand test in no more than 20 seconds  Baseline:  Goal status: INITIAL  4.  Will be able to complete all functional transfers safely with good hand placement, general sequencing, and safety awareness  Baseline:  Goal status: INITIAL   LONG TERM GOALS: Target date: 07/04/2022   MMT to improve by at least one grade in all weak groups  Baseline:  Goal status: INITIAL  2.  Will score at least 48 on the Berg to show reduced fall risk  Baseline:  Goal status: INITIAL  3.  Will be able to ambulate at least 590f on 3MWT with LRAD to show improved functional activity tolerance  Baseline:  Goal status: INITIAL  4.  Will be compliant with regular progressive walking program at home  Baseline:  Goal status: INITIAL  5.  Will be compliant with gym based vs advanced HEP at DC to maintain functional gains/prevent recurrence of deconditioning  Baseline:  Goal status: INITIAL    PLAN: PT FREQUENCY: 2x/week  PT DURATION: 8 weeks  PLANNED INTERVENTIONS: Therapeutic exercises, Therapeutic activity, Neuromuscular re-education, Balance training, Gait training, Patient/Family education, Joint mobilization, Stair training, DME  instructions, Aquatic Therapy, Cognitive remediation, Cryotherapy, Moist heat, Taping, Ultrasound, Ionotophoresis '4mg'$ /ml Dexamethasone, Manual therapy, and Re-evaluation  PLAN FOR NEXT SESSION: still needs Berg assessment; work on strength, balance, functional activity tolerance. First 4 weeks of POC in water, second 4 weeks we will transition to land therapy  JKerin Perna PTA 05/09/22 12:25 PM

## 2022-05-11 ENCOUNTER — Ambulatory Visit (HOSPITAL_BASED_OUTPATIENT_CLINIC_OR_DEPARTMENT_OTHER): Payer: Medicare Other | Admitting: Physical Therapy

## 2022-05-11 ENCOUNTER — Other Ambulatory Visit (HOSPITAL_BASED_OUTPATIENT_CLINIC_OR_DEPARTMENT_OTHER): Payer: Self-pay

## 2022-05-11 ENCOUNTER — Encounter (HOSPITAL_BASED_OUTPATIENT_CLINIC_OR_DEPARTMENT_OTHER): Payer: Self-pay | Admitting: Physical Therapy

## 2022-05-11 DIAGNOSIS — R2681 Unsteadiness on feet: Secondary | ICD-10-CM

## 2022-05-11 DIAGNOSIS — M6281 Muscle weakness (generalized): Secondary | ICD-10-CM | POA: Diagnosis not present

## 2022-05-11 DIAGNOSIS — R2689 Other abnormalities of gait and mobility: Secondary | ICD-10-CM

## 2022-05-11 NOTE — Therapy (Signed)
OUTPATIENT PHYSICAL THERAPY LOWER EXTREMITY TREATMENT NOTE   Patient Name: Glen [name] MRN: [id number] DOB:Mar 01, 1928, 88 y.o., male Today's Date: 05/11/2022   PT End of Session - 05/11/22 1159     Visit Number 8    Number of Visits 17    Date for PT Re-Evaluation 05/31/22    Authorization Time Period 04/05/22 to 05/31/22    PT Start Time 8315    PT Stop Time 1761    PT Time Calculation (min) 44 min    Activity Tolerance Patient tolerated treatment well    Behavior During Therapy [employer] for tasks assessed/performed             Past Medical History:  Diagnosis Date   Atrial fibrillation ([place])    Diverticulitis    Esophageal dysmotility    Family history of diabetes insipidus    FHx: colon cancer    H. pylori infection    Hiatal hernia    History of colonic polyps    Hyperlipidemia    Hypertension    IDA (iron deficiency anemia)    Neoplasm of prostate    special screening malignant    Obesity    Palpitation    Peptic ulcer disease    Presbyesophagus    Schatzki's ring    Past Surgical History:  Procedure Laterality Date   2-D Echocardiogram  November 2009, 2011   Anal Fistula Repair     BALLOON DILATION N/A 01/17/2022   Procedure: [place];  Surgeon: Jackquline Denmark, MD;  Location: [facility];  Service: Gastroenterology;  Laterality: N/A;   BIOPSY  07/14/2020   Procedure: BIOPSY;  Surgeon: [name], MD;  Location: [facility];  Service: Gastroenterology;;   BIOPSY  01/17/2022   Procedure: BIOPSY;  Surgeon: Jackquline Denmark, MD;  Location: [facility];  Service: Gastroenterology;;   CARDIAC VALVE SURGERY  1996   status post balloon valvuloplasty at [facility]  2004   COLONOSCOPY  December 2005   ESOPHAGOGASTRODUODENOSCOPY  07/14/2020   ESOPHAGOGASTRODUODENOSCOPY N/A 01/17/2022   Procedure: ESOPHAGOGASTRODUODENOSCOPY (EGD);  Surgeon: Jackquline Denmark, MD;  Location: [facility];  Service: Gastroenterology;  Laterality: N/A;    ESOPHAGOGASTRODUODENOSCOPY (EGD) WITH PROPOFOL N/A 07/14/2020   Procedure: ESOPHAGOGASTRODUODENOSCOPY (EGD) WITH PROPOFOL;  Surgeon: [name], MD;  Location: [facility];  Service: Gastroenterology;  Laterality: N/A;   ETT  1991   Negative   HEMOSTASIS CLIP PLACEMENT  07/14/2020   HEMOSTASIS CLIP PLACEMENT   HEMOSTASIS CLIP PLACEMENT  07/14/2020   Procedure: HEMOSTASIS CLIP PLACEMENT;  Surgeon: [name], MD;  Location: [facility];  Service: Gastroenterology;;   KNEE ARTHROSCOPY     Right   TONSILLECTOMY     Patient Active Problem List   Diagnosis Date Noted   Physical deconditioning 01/16/2022   Dehydration 01/16/2022   Peptic ulcer disease    Fall 07/13/2020   Osteoarthritis 06/25/2019   Onychomycosis 07/29/2018   Esophageal dysphagia 07/29/2018   Peripheral edema 11/18/2015   Impaired glucose tolerance 04/19/2015   Essential hypertension 01/20/2009   Atrial fibrillation, chronic ([place]) 03/13/2008   Dyslipidemia 07/31/2007   History of colonic polyps 07/31/2007    PCP: Dimas Chyle   REFERRING PROVIDER: Buford Dresser, MD   REFERRING DIAG: R53.81 (ICD-10-CM) - Physical deconditioning   THERAPY DIAG:  Muscle weakness (generalized)  Unsteadiness on feet  Other abnormalities of gait and mobility  Rationale for Evaluation and Treatment Rehabilitation  ONSET DATE: 02/02/2022   SUBJECTIVE:   SUBJECTIVE STATEMENT: "Doing pretty  good"     PERTINENT HISTORY: PMH: HTN, Afib, OA, HLD, hernia, IDA, neoplasm of prostate, cardiac valve surgery, knee arthroscopy  Glen [name] is a 88 y.o. male with a hx of hypertension, hyperlidemia and permanent AF, previously on coumadin but now on no anticoagulation who is seen for a post hospital visit.    At his last appointment I reviewed his 07/2020 hospitalization. He was seen for a 7 day transition of care visit. All medications reviewed, as well as review of hospital course and discharge summary. Patient's  main concern was for over the counter medications for sinus congestion, allergies, pain, and sleep. He was told that he could not take anything until this was cleared by a doctor. We discussed this at length. Discussed absolute avoidance of NSAIDs. [place] for physical therapy, etc from a heart perspective. Reviewed 07/2020 hospitalization for GI bleed. ECG 07/14/20 afib RVR with PVC. Only cardiac medication at that visit was metoprolol, and heart rate was 95 bpm. I discussed changing to extended release metoprolol but he declined and would prefer to keep tartrate, as he said this was what Dr. Claiborne Billings had recommended to him. He was difficult to keep on task during the interview. He had chronic LE edema, which he endorsed is from the metoprolol. We discussed chronic venous insufficiency in depth. Could not feel his afib.     PAIN:  Are you having pain? No Location:  Pain rating:  Description:  Aggravating factors:  lower back / buttocks f sitting on hard surface Alleviating factors: medicine   PRECAUTIONS: Fall  WEIGHT BEARING RESTRICTIONS No  FALLS:  Has patient fallen in last 6 months? No  LIVING ENVIRONMENT: Lives with: lives with their spouse but son is 10 minutes away  Lives in: House/apartment Stairs: Yes: Internal: 4 steps; can reach both does not have to go upstairs inside the home  Has following equipment at home: Gilford Rile - 2 wheeled and [employer] - 4 wheeled, shower chair, grab bars   OCCUPATION: retired   PLOF: Independent, Independent with basic ADLs, Independent with household mobility with device, and Requires assistive device for independence  PATIENT GOALS build endurance    OBJECTIVE:  * findings taken at evaluation unless otherwise noted  DIAGNOSTIC FINDINGS:   PATIENT SURVEYS:    COGNITION:  Overall cognitive status:  very tangential and hard to keep on track          POSTURE: rounded shoulders, forward head, decreased lumbar lordosis, increased thoracic kyphosis,  and flexed trunk     LOWER EXTREMITY MMT:  MMT Right eval Left eval  Hip flexion 3+ 3+  Hip extension    Hip abduction 3 (sitting) 3 (sitting)  Hip adduction    Hip internal rotation    Hip external rotation    Knee flexion 4 4  Knee extension 4+ 4+  Ankle dorsiflexion 4- 4-  Ankle plantarflexion    Ankle inversion    Ankle eversion     (Blank rows = not tested)   FUNCTIONAL TESTS:  5 times sit to stand: 32 seconds had to use BUEs, very poor eccentric control and often "plopped" onto the table  3 minute walk test: 358f rollator no rest breaks  Tandem stance: unable to hold longer than 9 seconds without BUE support or assist from PT   GAIT: Distance walked: 3273fAssistive device utilized: WaEnvironmental consultant 4 wheeled Level of assistance: SBA Comments: flexed trunk, tends to push rollator a bit far ahead of himself, slow pace, really  easily fatigued     TODAY'S TREATMENT: Pt seen for aquatic therapy today.  Water 3.25-4.8 ft in depth. Temp of water was 91.  Pt entered/exited the pool via stairs step to with SBA using bilat handrails.  * using white barbell and intermittent CGA to SBA:    -Multiple laps of forward gait, backward gait, side stepping, VC for posture, step length and speed control.   * Holding wall:   squats, heel raises, hip abdct / add; hip extension; marching * Seated on bench, with feet on blue step-  STS x 7 with UE on white barbell and mod-min assist from therapist to steady; STS onto pool floor x 7 cga, cues to gain position without leaning against bench *seated LAQ x20; cycling (assist from therapist to remain on bench)   Pt requires buoyancy for support and to offload joints with strengthening exercises. Viscosity of the water is needed for resistance of strengthening; water current perturbations provides challenge to standing balance unsupported, requiring increased core activation.   PATIENT EDUCATION:  Education details: exam findings, POC, HEP,  education on general process of water therapy/transition to land  Person educated: Patient and Child(ren) Education method: Explanation Education comprehension: verbalized understanding and needs further education   HOME EXERCISE PROGRAM: Will be assigned next land session   ASSESSMENT:  CLINICAL IMPRESSION: Progressed past using water walker to white barbell as pt demonstrating improved core and LE strength as well as balance submerged. Requires min assist  2-3 times to decrease gait speed/recover from slight LOB. Elevated core engagement with decrease ue support of barbell. Improved backward amb with cues to slow pace. Continues backward leaning with STS.  Pt requires presence of therapist in pool at all times due to decreased balance and decreased confidence in water.    OBJECTIVE IMPAIRMENTS Abnormal gait, cardiopulmonary status limiting activity, decreased activity tolerance, decreased balance, decreased coordination, decreased endurance, decreased knowledge of use of DME, decreased mobility, difficulty walking, decreased strength, decreased safety awareness, increased fascial restrictions, postural dysfunction, and obesity.   ACTIVITY LIMITATIONS carrying, lifting, bending, standing, squatting, stairs, transfers, bed mobility, bathing, locomotion level, and caring for others  PARTICIPATION LIMITATIONS: cleaning, shopping, and community activity  PERSONAL FACTORS Age, Behavior pattern, Education, Fitness, Social background, and Time since onset of injury/illness/exacerbation are also affecting patient's functional outcome.   REHAB POTENTIAL: Good  CLINICAL DECISION MAKING: Stable/uncomplicated  EVALUATION COMPLEXITY: Low   GOALS: Goals reviewed with patient? Yes  SHORT TERM GOALS: Target date: 06/08/2022  Will be compliant with appropriate HEP  Baseline: Goal status: INITIAL  2.  Will be able to name 3 ways to reduce fall risk at home and in the community  Baseline:  Goal  status: INITIAL  3.  Will be able to complete 5x sit to stand test in no more than 20 seconds  Baseline:  Goal status: INITIAL  4.  Will be able to complete all functional transfers safely with good hand placement, general sequencing, and safety awareness  Baseline:  Goal status: INITIAL   LONG TERM GOALS: Target date: 07/06/2022   MMT to improve by at least one grade in all weak groups  Baseline:  Goal status: INITIAL  2.  Will score at least 48 on the Berg to show reduced fall risk  Baseline:  Goal status: INITIAL  3.  Will be able to ambulate at least 570f on 3MWT with LRAD to show improved functional activity tolerance  Baseline:  Goal status: INITIAL  4.  Will be compliant  with regular progressive walking program at home  Baseline:  Goal status: INITIAL  5.  Will be compliant with gym based vs advanced HEP at DC to maintain functional gains/prevent recurrence of deconditioning  Baseline:  Goal status: INITIAL    PLAN: PT FREQUENCY: 2x/week  PT DURATION: 8 weeks  PLANNED INTERVENTIONS: Therapeutic exercises, Therapeutic activity, Neuromuscular re-education, Balance training, Gait training, Patient/Family education, Joint mobilization, Stair training, DME instructions, Aquatic Therapy, Cognitive remediation, Cryotherapy, Moist heat, Taping, Ultrasound, Ionotophoresis '4mg'$ /ml Dexamethasone, Manual therapy, and Re-evaluation  PLAN FOR NEXT SESSION: still needs Berg; work on strength, balance, functional activity tolerance. First 4 weeks of POC in water, second 4 weeks we will transition to land therapy   Stanton Kidney Tharon Aquas) Norissa Bartee MPT 05/11/22 12:00 PM

## 2022-05-12 ENCOUNTER — Ambulatory Visit (HOSPITAL_BASED_OUTPATIENT_CLINIC_OR_DEPARTMENT_OTHER): Payer: Medicare Other | Admitting: Physical Therapy

## 2022-05-15 ENCOUNTER — Encounter: Payer: Self-pay | Admitting: Internal Medicine

## 2022-05-16 ENCOUNTER — Encounter (HOSPITAL_BASED_OUTPATIENT_CLINIC_OR_DEPARTMENT_OTHER): Payer: Self-pay | Admitting: Physical Therapy

## 2022-05-16 ENCOUNTER — Ambulatory Visit (HOSPITAL_BASED_OUTPATIENT_CLINIC_OR_DEPARTMENT_OTHER): Payer: Medicare Other | Admitting: Physical Therapy

## 2022-05-16 DIAGNOSIS — R2681 Unsteadiness on feet: Secondary | ICD-10-CM

## 2022-05-16 DIAGNOSIS — R2689 Other abnormalities of gait and mobility: Secondary | ICD-10-CM

## 2022-05-16 DIAGNOSIS — M6281 Muscle weakness (generalized): Secondary | ICD-10-CM | POA: Diagnosis not present

## 2022-05-16 NOTE — Therapy (Signed)
OUTPATIENT PHYSICAL THERAPY LOWER EXTREMITY TREATMENT NOTE   Patient Name: Glen [name] MRN: [id number] DOB:15-Sep-1928, 88 y.o., male Today's Date: 05/16/2022   PT End of Session - 05/16/22 1356     Visit Number 9    Number of Visits 17    Date for PT Re-Evaluation 05/31/22    Authorization Time Period 04/05/22 to 05/31/22    Progress Note Due on Visit 10    PT Start Time 1039    PT Stop Time 1115    PT Time Calculation (min) 36 min    Activity Tolerance Patient tolerated treatment well    Behavior During Therapy [facility] for tasks assessed/performed             Past Medical History:  Diagnosis Date   Atrial fibrillation ([place])    Diverticulitis    Esophageal dysmotility    Family history of diabetes insipidus    FHx: colon cancer    H. pylori infection    Hiatal hernia    History of colonic polyps    Hyperlipidemia    Hypertension    IDA (iron deficiency anemia)    Neoplasm of prostate    special screening malignant    Obesity    Palpitation    Peptic ulcer disease    Presbyesophagus    Schatzki's ring    Past Surgical History:  Procedure Laterality Date   2-D Echocardiogram  November 2009, 2011   Anal Fistula Repair     BALLOON DILATION N/A 01/17/2022   Procedure: Stacie Acres;  Surgeon: Jackquline Denmark, MD;  Location: [facility];  Service: Gastroenterology;  Laterality: N/A;   BIOPSY  07/14/2020   Procedure: BIOPSY;  Surgeon: [name], MD;  Location: [facility]COPY;  Service: Gastroenterology;;   BIOPSY  01/17/2022   Procedure: BIOPSY;  Surgeon: Jackquline Denmark, MD;  Location: [facility]OSCOPY;  Service: Gastroenterology;;   CARDIAC VALVE SURGERY  1996   status post balloon valvuloplasty at [place]  2004   COLONOSCOPY  December 2005   ESOPHAGOGASTRODUODENOSCOPY  07/14/2020   ESOPHAGOGASTRODUODENOSCOPY N/A 01/17/2022   Procedure: ESOPHAGOGASTRODUODENOSCOPY (EGD);  Surgeon: Jackquline Denmark, MD;  Location: [facility];  Service:  Gastroenterology;  Laterality: N/A;   ESOPHAGOGASTRODUODENOSCOPY (EGD) WITH PROPOFOL N/A 07/14/2020   Procedure: ESOPHAGOGASTRODUODENOSCOPY (EGD) WITH PROPOFOL;  Surgeon: [name], MD;  Location: [facility] ENDOSCOPY;  Service: Gastroenterology;  Laterality: N/A;   ETT  1991   Negative   HEMOSTASIS CLIP PLACEMENT  07/14/2020   HEMOSTASIS CLIP PLACEMENT   HEMOSTASIS CLIP PLACEMENT  07/14/2020   Procedure: HEMOSTASIS CLIP PLACEMENT;  Surgeon: [name], MD;  Location: [facility] [facility]OSCOPY;  Service: Gastroenterology;;   KNEE ARTHROSCOPY     Right   TONSILLECTOMY     Patient Active Problem List   Diagnosis Date Noted   Physical deconditioning 01/16/2022   Dehydration 01/16/2022   Peptic ulcer disease    Fall 07/13/2020   Osteoarthritis 06/25/2019   Onychomycosis 07/29/2018   Esophageal dysphagia 07/29/2018   Peripheral edema 11/18/2015   Impaired glucose tolerance 04/19/2015   Essential hypertension 01/20/2009   Atrial fibrillation, chronic ([place]) 03/13/2008   Dyslipidemia 07/31/2007   History of colonic polyps 07/31/2007    PCP: Dimas Chyle   REFERRING PROVIDER: Buford Dresser, MD   REFERRING DIAG: R53.81 (ICD-10-CM) - Physical deconditioning   THERAPY DIAG:  Muscle weakness (generalized)  Unsteadiness on feet  Other abnormalities of gait and mobility  Rationale for Evaluation and Treatment Rehabilitation  ONSET DATE: 02/02/2022  SUBJECTIVE:   SUBJECTIVE STATEMENT: "Doing pretty good"     PERTINENT HISTORY: PMH: HTN, Afib, OA, HLD, hernia, IDA, neoplasm of prostate, cardiac valve surgery, knee arthroscopy  Glen [name] is a 88 y.o. male with a hx of hypertension, hyperlidemia and permanent AF, previously on coumadin but now on no anticoagulation who is seen for a post hospital visit.    At his last appointment I reviewed his 07/2020 hospitalization. He was seen for a 7 day transition of care visit. All medications reviewed, as well as review of hospital  course and discharge summary. Patient's main concern was for over the counter medications for sinus congestion, allergies, pain, and sleep. He was told that he could not take anything until this was cleared by a doctor. We discussed this at length. Discussed absolute avoidance of NSAIDs. [place] for physical therapy, etc from a heart perspective. Reviewed 07/2020 hospitalization for GI bleed. ECG 07/14/20 afib RVR with PVC. Only cardiac medication at that visit was metoprolol, and heart rate was 95 bpm. I discussed changing to extended release metoprolol but he declined and would prefer to keep tartrate, as he said this was what Dr. Claiborne Billings had recommended to him. He was difficult to keep on task during the interview. He had chronic LE edema, which he endorsed is from the metoprolol. We discussed chronic venous insufficiency in depth. Could not feel his afib.     PAIN:  Are you having pain? No Location:  Pain rating:  Description:  Aggravating factors:  lower back / buttocks f sitting on hard surface Alleviating factors: medicine   PRECAUTIONS: Fall  WEIGHT BEARING RESTRICTIONS No  FALLS:  Has patient fallen in last 6 months? No  LIVING ENVIRONMENT: Lives with: lives with their spouse but son is 10 minutes away  Lives in: House/apartment Stairs: Yes: Internal: 4 steps; can reach both does not have to go upstairs inside the home  Has following equipment at home: Gilford Rile - 2 wheeled and [employer] - 4 wheeled, shower chair, grab bars   OCCUPATION: retired   PLOF: Independent, Independent with basic ADLs, Independent with household mobility with device, and Requires assistive device for independence  PATIENT GOALS build endurance    OBJECTIVE:  * findings taken at evaluation unless otherwise noted  DIAGNOSTIC FINDINGS:   PATIENT SURVEYS:    COGNITION:  Overall cognitive status:  very tangential and hard to keep on track          POSTURE: rounded shoulders, forward head, decreased lumbar  lordosis, increased thoracic kyphosis, and flexed trunk     LOWER EXTREMITY MMT:  MMT Right eval Left eval  Hip flexion 3+ 3+  Hip extension    Hip abduction 3 (sitting) 3 (sitting)  Hip adduction    Hip internal rotation    Hip external rotation    Knee flexion 4 4  Knee extension 4+ 4+  Ankle dorsiflexion 4- 4-  Ankle plantarflexion    Ankle inversion    Ankle eversion     (Blank rows = not tested)   FUNCTIONAL TESTS:  5 times sit to stand: 32 seconds had to use BUEs, very poor eccentric control and often "plopped" onto the table  3 minute walk test: 371f rollator no rest breaks  Tandem stance: unable to hold longer than 9 seconds without BUE support or assist from PT   GAIT: Distance walked: 3250fAssistive device utilized: Walker - 4 wheeled Level of assistance: SBA Comments: flexed trunk, tends to push rollator a bit  far ahead of himself, slow pace, really easily fatigued     TODAY'S TREATMENT: Pt seen for aquatic therapy today.  Water 3.25-4.8 ft in depth. Temp of water was 91.  Pt entered/exited the pool via stairs step to with SBA using bilat handrails.  * using white barbell and intermittent CGA to SBA:    -Multiple laps of forward gait, backward gait, side stepping, VC for posture, step length and speed control.   * Holding wall:   squats, heel raises, hip abdct / add; hip extension; marching * Seated on bench, with feet on blue step-  STS x 7 with UE on white barbell and mod-min assist from therapist to steady; STS onto pool floor x 7 cga, cues to gain position without leaning against bench *seated LAQ x20; cycling (assist from therapist to remain on bench)   Pt requires buoyancy for support and to offload joints with strengthening exercises. Viscosity of the water is needed for resistance of strengthening; water current perturbations provides challenge to standing balance unsupported, requiring increased core activation.   PATIENT EDUCATION:  Education  details: exam findings, POC, HEP, education on general process of water therapy/transition to land  Person educated: Patient and Child(ren) Education method: Explanation Education comprehension: verbalized understanding and needs further education   HOME EXERCISE PROGRAM: Will be assigned next land session   ASSESSMENT:  CLINICAL IMPRESSION: Progressed past using water walker to white barbell as pt demonstrating improved core and LE strength as well as balance submerged. Requires min assist  2-3 times to decrease gait speed/recover from slight LOB. Elevated core engagement with decrease ue support of barbell. Improved backward amb with cues to slow pace. Continues backward leaning with STS.  Pt requires presence of therapist in pool at all times due to decreased balance and decreased confidence in water.    OBJECTIVE IMPAIRMENTS Abnormal gait, cardiopulmonary status limiting activity, decreased activity tolerance, decreased balance, decreased coordination, decreased endurance, decreased knowledge of use of DME, decreased mobility, difficulty walking, decreased strength, decreased safety awareness, increased fascial restrictions, postural dysfunction, and obesity.   ACTIVITY LIMITATIONS carrying, lifting, bending, standing, squatting, stairs, transfers, bed mobility, bathing, locomotion level, and caring for others  PARTICIPATION LIMITATIONS: cleaning, shopping, and community activity  PERSONAL FACTORS Age, Behavior pattern, Education, Fitness, Social background, and Time since onset of injury/illness/exacerbation are also affecting patient's functional outcome.   REHAB POTENTIAL: Good  CLINICAL DECISION MAKING: Stable/uncomplicated  EVALUATION COMPLEXITY: Low   GOALS: Goals reviewed with patient? Yes  SHORT TERM GOALS: Target date: 06/13/2022  Will be compliant with appropriate HEP  Baseline: Goal status: INITIAL  2.  Will be able to name 3 ways to reduce fall risk at home and  in the community  Baseline:  Goal status: INITIAL  3.  Will be able to complete 5x sit to stand test in no more than 20 seconds  Baseline:  Goal status: INITIAL  4.  Will be able to complete all functional transfers safely with good hand placement, general sequencing, and safety awareness  Baseline:  Goal status: INITIAL   LONG TERM GOALS: Target date: 07/11/2022   MMT to improve by at least one grade in all weak groups  Baseline:  Goal status: INITIAL  2.  Will score at least 48 on the Berg to show reduced fall risk  Baseline:  Goal status: INITIAL  3.  Will be able to ambulate at least 561f on 3MWT with LRAD to show improved functional activity tolerance  Baseline:  Goal status:  INITIAL  4.  Will be compliant with regular progressive walking program at home  Baseline:  Goal status: INITIAL  5.  Will be compliant with gym based vs advanced HEP at DC to maintain functional gains/prevent recurrence of deconditioning  Baseline:  Goal status: INITIAL    PLAN: PT FREQUENCY: 2x/week  PT DURATION: 8 weeks  PLANNED INTERVENTIONS: Therapeutic exercises, Therapeutic activity, Neuromuscular re-education, Balance training, Gait training, Patient/Family education, Joint mobilization, Stair training, DME instructions, Aquatic Therapy, Cognitive remediation, Cryotherapy, Moist heat, Taping, Ultrasound, Ionotophoresis '4mg'$ /ml Dexamethasone, Manual therapy, and Re-evaluation  PLAN FOR NEXT SESSION: still needs Berg; work on strength, balance, functional activity tolerance. First 4 weeks of POC in water, second 4 weeks we will transition to land therapy   Stanton Kidney Tharon Aquas) [place] MPT 05/16/22 1:57 PM                      OUTPATIENT PHYSICAL THERAPY LOWER EXTREMITY TREATMENT NOTE   Patient Name: Glen [name] MRN: [id number] DOB:11-09-1927, 88 y.o., male Today's Date: 05/16/2022   PT End of Session - 05/16/22 1356     Visit Number 9    Number of Visits 17     Date for PT Re-Evaluation 05/31/22    Authorization Time Period 04/05/22 to 05/31/22    Progress Note Due on Visit 10    PT Start Time 1039    PT Stop Time 1115    PT Time Calculation (min) 36 min    Activity Tolerance Patient tolerated treatment well    Behavior During Therapy [facility] for tasks assessed/performed             Past Medical History:  Diagnosis Date   Atrial fibrillation ([place])    Diverticulitis    Esophageal dysmotility    Family history of diabetes insipidus    FHx: colon cancer    H. pylori infection    Hiatal hernia    History of colonic polyps    Hyperlipidemia    Hypertension    IDA (iron deficiency anemia)    Neoplasm of prostate    special screening malignant    Obesity    Palpitation    Peptic ulcer disease    Presbyesophagus    Schatzki's ring    Past Surgical History:  Procedure Laterality Date   2-D Echocardiogram  November 2009, 2011   Anal Fistula Repair     BALLOON DILATION N/A 01/17/2022   Procedure: Stacie Acres;  Surgeon: Jackquline Denmark, MD;  Location: [facility];  Service: Gastroenterology;  Laterality: N/A;   BIOPSY  07/14/2020   Procedure: BIOPSY;  Surgeon: [name], MD;  Location: [facility] ENDOSCOPY;  Service: Gastroenterology;;   BIOPSY  01/17/2022   Procedure: BIOPSY;  Surgeon: Jackquline Denmark, MD;  Location: [facility]COPY;  Service: Gastroenterology;;   CARDIAC VALVE SURGERY  1996   status post balloon valvuloplasty at [place]  2004   COLONOSCOPY  December 2005   ESOPHAGOGASTRODUODENOSCOPY  07/14/2020   ESOPHAGOGASTRODUODENOSCOPY N/A 01/17/2022   Procedure: ESOPHAGOGASTRODUODENOSCOPY (EGD);  Surgeon: Jackquline Denmark, MD;  Location: [facility];  Service: Gastroenterology;  Laterality: N/A;   ESOPHAGOGASTRODUODENOSCOPY (EGD) WITH PROPOFOL N/A 07/14/2020   Procedure: ESOPHAGOGASTRODUODENOSCOPY (EGD) WITH PROPOFOL;  Surgeon: [name], MD;  Location: [facility] ENDOSCOPY;  Service: Gastroenterology;  Laterality: N/A;   ETT   1991   Negative   HEMOSTASIS CLIP PLACEMENT  07/14/2020   HEMOSTASIS CLIP PLACEMENT   HEMOSTASIS CLIP PLACEMENT  07/14/2020   Procedure: HEMOSTASIS  CLIP PLACEMENT;  Surgeon: [name], MD;  Location: [facility] ENDOSCOPY;  Service: Gastroenterology;;   KNEE ARTHROSCOPY     Right   TONSILLECTOMY     Patient Active Problem List   Diagnosis Date Noted   Physical deconditioning 01/16/2022   Dehydration 01/16/2022   Peptic ulcer disease    Fall 07/13/2020   Osteoarthritis 06/25/2019   Onychomycosis 07/29/2018   Esophageal dysphagia 07/29/2018   Peripheral edema 11/18/2015   Impaired glucose tolerance 04/19/2015   Essential hypertension 01/20/2009   Atrial fibrillation, chronic ([place]) 03/13/2008   Dyslipidemia 07/31/2007   History of colonic polyps 07/31/2007    PCP: Dimas Chyle   REFERRING PROVIDER: Buford Dresser, MD   REFERRING DIAG: R53.81 (ICD-10-CM) - Physical deconditioning   THERAPY DIAG:  Muscle weakness (generalized)  Unsteadiness on feet  Other abnormalities of gait and mobility  Rationale for Evaluation and Treatment Rehabilitation  ONSET DATE: 02/02/2022   SUBJECTIVE:   SUBJECTIVE STATEMENT:  "My legs just feel drawn".    Pt reports he believes the rash on his RLE is improving.      PERTINENT HISTORY: PMH: HTN, Afib, OA, HLD, hernia, IDA, neoplasm of prostate, cardiac valve surgery, knee arthroscopy  JOBANNY MAVIS is a 88 y.o. male with a hx of hypertension, hyperlidemia and permanent AF, previously on coumadin but now on no anticoagulation who is seen for a post hospital visit.    At his last appointment I reviewed his 07/2020 hospitalization. He was seen for a 7 day transition of care visit. All medications reviewed, as well as review of hospital course and discharge summary. Patient's main concern was for over the counter medications for sinus congestion, allergies, pain, and sleep. He was told that he could not take anything until this was  cleared by a doctor. We discussed this at length. Discussed absolute avoidance of NSAIDs. [place] for physical therapy, etc from a heart perspective. Reviewed 07/2020 hospitalization for GI bleed. ECG 07/14/20 afib RVR with PVC. Only cardiac medication at that visit was metoprolol, and heart rate was 95 bpm. I discussed changing to extended release metoprolol but he declined and would prefer to keep tartrate, as he said this was what Dr. Claiborne Billings had recommended to him. He was difficult to keep on task during the interview. He had chronic LE edema, which he endorsed is from the metoprolol. We discussed chronic venous insufficiency in depth. Could not feel his afib.     PAIN:  Are you having pain? No Location: n/a Pain rating:  Description:  Aggravating factors:  lower back / buttocks f sitting on hard surface Alleviating factors: medicine   PRECAUTIONS: Fall  WEIGHT BEARING RESTRICTIONS No  FALLS:  Has patient fallen in last 6 months? No  LIVING ENVIRONMENT: Lives with: lives with their spouse but son is 10 minutes away  Lives in: House/apartment Stairs: Yes: Internal: 4 steps; can reach both does not have to go upstairs inside the home  Has following equipment at home: Gilford Rile - 2 wheeled and [employer] - 4 wheeled, shower chair, grab bars   OCCUPATION: retired   PLOF: Independent, Independent with basic ADLs, Independent with household mobility with device, and Requires assistive device for independence  PATIENT GOALS build endurance    OBJECTIVE:  * findings taken at evaluation unless otherwise noted  DIAGNOSTIC FINDINGS:   PATIENT SURVEYS:    COGNITION:  Overall cognitive status:  very tangential and hard to keep on track  POSTURE: rounded shoulders, forward head, decreased lumbar lordosis, increased thoracic kyphosis, and flexed trunk     LOWER EXTREMITY MMT:  MMT Right eval Left eval  Hip flexion 3+ 3+  Hip extension    Hip abduction 3 (sitting) 3 (sitting)   Hip adduction    Hip internal rotation    Hip external rotation    Knee flexion 4 4  Knee extension 4+ 4+  Ankle dorsiflexion 4- 4-  Ankle plantarflexion    Ankle inversion    Ankle eversion     (Blank rows = not tested)   FUNCTIONAL TESTS:  5 times sit to stand: 32 seconds had to use BUEs, very poor eccentric control and often "plopped" onto the table  3 minute walk test: 375f rollator no rest breaks  Tandem stance: unable to hold longer than 9 seconds without BUE support or assist from PT   GAIT: Distance walked: 3262fAssistive device utilized: WaEnvironmental consultant 4 wheeled Level of assistance: SBA Comments: flexed trunk, tends to push rollator a bit far ahead of himself, slow pace, really easily fatigued     TODAY'S TREATMENT: Pt seen for aquatic therapy today.  Water 3.25-4.8 ft in depth. Temp of water was 91.  Pt entered/exited the pool via stairs step to with SBA using bilat handrails.  * using white barbell and intermittent CGA to SBA:    -Multiple laps of forward gait, backward gait, side stepping, VC for posture, step length and speed control.   * Holding wall:   squats, heel raises, hip abdct / add; hip extension; marching * Seated on bench holding white barbell:  STS x 7 and CGA intermittently from therapist to steady * holding white barbell without back support- row motion and SBA * holding rainbow hand buoys without back support - bilat shoulder horiz abdct/ add with SBA/ CGA   Pt requires buoyancy for support and to offload joints with strengthening exercises. Viscosity of the water is needed for resistance of strengthening; water current perturbations provides challenge to standing balance unsupported, requiring increased core activation.   PATIENT EDUCATION:  Education details:  general aquatic progression Person educated: Patient and Child(ren) Education method: Explanation Education comprehension: verbalized understanding and needs further education   HOME  EXERCISE PROGRAM: Will be assigned next land session   ASSESSMENT:  CLINICAL IMPRESSION: Progressed to white barbell and SBA as pt demonstrating improved core and LE strength as well as balance submerged. Required CGA/min A 2x to recover from slight LOB. Elevated core engagement with decrease ue support of barbell. Improved backward amb with cues to slow pace. Continues backward leaning with STS.  Pt requires presence of therapist in pool at all times due to decreased balance and decreased confidence in water. Pt making progress towards all goals with improved posture, balance, and endurance.      OBJECTIVE IMPAIRMENTS Abnormal gait, cardiopulmonary status limiting activity, decreased activity tolerance, decreased balance, decreased coordination, decreased endurance, decreased knowledge of use of DME, decreased mobility, difficulty walking, decreased strength, decreased safety awareness, increased fascial restrictions, postural dysfunction, and obesity.   ACTIVITY LIMITATIONS carrying, lifting, bending, standing, squatting, stairs, transfers, bed mobility, bathing, locomotion level, and caring for others  PARTICIPATION LIMITATIONS: cleaning, shopping, and community activity  PERSONAL FACTORS Age, Behavior pattern, Education, Fitness, Social background, and Time since onset of injury/illness/exacerbation are also affecting patient's functional outcome.   REHAB POTENTIAL: Good  CLINICAL DECISION MAKING: Stable/uncomplicated  EVALUATION COMPLEXITY: Low   GOALS: Goals reviewed with patient? Yes  SHORT TERM  GOALS: Target date: 06/13/2022  Will be compliant with appropriate HEP  Baseline: Goal status: INITIAL  2.  Will be able to name 3 ways to reduce fall risk at home and in the community  Baseline:  Goal status: INITIAL  3.  Will be able to complete 5x sit to stand test in no more than 20 seconds  Baseline:  Goal status: INITIAL  4.  Will be able to complete all functional  transfers safely with good hand placement, general sequencing, and safety awareness  Baseline:  Goal status: INITIAL   LONG TERM GOALS: Target date: 07/11/2022   MMT to improve by at least one grade in all weak groups  Baseline:  Goal status: INITIAL  2.  Will score at least 48 on the Berg to show reduced fall risk  Baseline:  Goal status: INITIAL  3.  Will be able to ambulate at least 585f on 3MWT with LRAD to show improved functional activity tolerance  Baseline:  Goal status: INITIAL  4.  Will be compliant with regular progressive walking program at home  Baseline:  Goal status: INITIAL  5.  Will be compliant with gym based vs advanced HEP at DC to maintain functional gains/prevent recurrence of deconditioning  Baseline:  Goal status: INITIAL    PLAN: PT FREQUENCY: 2x/week  PT DURATION: 8 weeks  PLANNED INTERVENTIONS: Therapeutic exercises, Therapeutic activity, Neuromuscular re-education, Balance training, Gait training, Patient/Family education, Joint mobilization, Stair training, DME instructions, Aquatic Therapy, Cognitive remediation, Cryotherapy, Moist heat, Taping, Ultrasound, Ionotophoresis '4mg'$ /ml Dexamethasone, Manual therapy, and Re-evaluation  PLAN FOR NEXT SESSION: still needs Berg; work on strength, balance, functional activity tolerance. (First 4 weeks of POC in water, second 4 weeks we will transition to land therapy ).  10th visit progress note.  JKerin Perna PTA 05/16/22 1:57 PM

## 2022-05-19 ENCOUNTER — Ambulatory Visit (HOSPITAL_BASED_OUTPATIENT_CLINIC_OR_DEPARTMENT_OTHER): Payer: Medicare Other | Admitting: Physical Therapy

## 2022-05-23 ENCOUNTER — Ambulatory Visit (HOSPITAL_BASED_OUTPATIENT_CLINIC_OR_DEPARTMENT_OTHER): Payer: Medicare Other | Admitting: Physical Therapy

## 2022-05-23 ENCOUNTER — Encounter (HOSPITAL_BASED_OUTPATIENT_CLINIC_OR_DEPARTMENT_OTHER): Payer: Self-pay | Admitting: Physical Therapy

## 2022-05-23 DIAGNOSIS — R2681 Unsteadiness on feet: Secondary | ICD-10-CM | POA: Diagnosis not present

## 2022-05-23 DIAGNOSIS — M6281 Muscle weakness (generalized): Secondary | ICD-10-CM

## 2022-05-23 DIAGNOSIS — R2689 Other abnormalities of gait and mobility: Secondary | ICD-10-CM | POA: Diagnosis not present

## 2022-05-23 NOTE — Therapy (Addendum)
OUTPATIENT PHYSICAL THERAPY LOWER EXTREMITY TREATMENT NOTE/Progress note    Patient Name: Glen [name] MRN: [id number] DOB:1927/11/14, 88 y.o., male Today's Date: 05/23/2022  Progress Note Reporting Period 04/05/2022 to 05/23/2022  See note below for Objective Data and Assessment of Progress/Goals.       PT End of Session - 05/23/22 1018     Visit Number 10    Number of Visits 17    Date for PT Re-Evaluation 05/31/22    PT Start Time 7829    PT Stop Time 1056    PT Time Calculation (min) 41 min    Activity Tolerance Patient tolerated treatment well    Behavior During Therapy [facility] for tasks assessed/performed              Past Medical History:  Diagnosis Date   Atrial fibrillation ([place])    Diverticulitis    Esophageal dysmotility    Family history of diabetes insipidus    FHx: colon cancer    H. pylori infection    Hiatal hernia    History of colonic polyps    Hyperlipidemia    Hypertension    IDA (iron deficiency anemia)    Neoplasm of prostate    special screening malignant    Obesity    Palpitation    Peptic ulcer disease    Presbyesophagus    Schatzki's ring    Past Surgical History:  Procedure Laterality Date   2-D Echocardiogram  November 2009, 2011   Anal Fistula Repair     BALLOON DILATION N/A 01/17/2022   Procedure: Larrie Kass DILATION;  Surgeon: Jackquline Denmark, MD;  Location: [facility];  Service: Gastroenterology;  Laterality: N/A;   BIOPSY  07/14/2020   Procedure: BIOPSY;  Surgeon: [name], MD;  Location: [facility] ENDOSCOPY;  Service: Gastroenterology;;   BIOPSY  01/17/2022   Procedure: BIOPSY;  Surgeon: Jackquline Denmark, MD;  Location: [facility] ENDOSCOPY;  Service: Gastroenterology;;   CARDIAC VALVE SURGERY  1996   status post balloon valvuloplasty at [place]  2004   COLONOSCOPY  December 2005   ESOPHAGOGASTRODUODENOSCOPY  07/14/2020   ESOPHAGOGASTRODUODENOSCOPY N/A 01/17/2022   Procedure: ESOPHAGOGASTRODUODENOSCOPY (EGD);  Surgeon:  Jackquline Denmark, MD;  Location: [facility];  Service: Gastroenterology;  Laterality: N/A;   ESOPHAGOGASTRODUODENOSCOPY (EGD) WITH PROPOFOL N/A 07/14/2020   Procedure: ESOPHAGOGASTRODUODENOSCOPY (EGD) WITH PROPOFOL;  Surgeon: [name], MD;  Location: [facility] ENDOSCOPY;  Service: Gastroenterology;  Laterality: N/A;   ETT  1991   Negative   HEMOSTASIS CLIP PLACEMENT  07/14/2020   HEMOSTASIS CLIP PLACEMENT   HEMOSTASIS CLIP PLACEMENT  07/14/2020   Procedure: HEMOSTASIS CLIP PLACEMENT;  Surgeon: [name], MD;  Location: [facility] ENDOSCOPY;  Service: Gastroenterology;;   KNEE ARTHROSCOPY     Right   TONSILLECTOMY     Patient Active Problem List   Diagnosis Date Noted   Physical deconditioning 01/16/2022   Dehydration 01/16/2022   Peptic ulcer disease    Fall 07/13/2020   Osteoarthritis 06/25/2019   Onychomycosis 07/29/2018   Esophageal dysphagia 07/29/2018   Peripheral edema 11/18/2015   Impaired glucose tolerance 04/19/2015   Essential hypertension 01/20/2009   Atrial fibrillation, chronic ([place]) 03/13/2008   Dyslipidemia 07/31/2007   History of colonic polyps 07/31/2007    PCP: Dimas Chyle   REFERRING PROVIDER: Buford Dresser, MD   REFERRING DIAG: R53.81 (ICD-10-CM) - Physical deconditioning   THERAPY DIAG:  Muscle weakness (generalized)  Unsteadiness on feet  Other abnormalities of gait and mobility  Rationale for  Evaluation and Treatment Rehabilitation  ONSET DATE: 02/02/2022   SUBJECTIVE:   SUBJECTIVE STATEMENT: Patient has no major complaints.     PERTINENT HISTORY: PMH: HTN, Afib, OA, HLD, hernia, IDA, neoplasm of prostate, cardiac valve surgery, knee arthroscopy  Glen [name] is a 88 y.o. male with a hx of hypertension, hyperlidemia and permanent AF, previously on coumadin but now on no anticoagulation who is seen for a post hospital visit.    At his last appointment I reviewed his 07/2020 hospitalization. He was seen for a 7 day transition of  care visit. All medications reviewed, as well as review of hospital course and discharge summary. Patient's main concern was for over the counter medications for sinus congestion, allergies, pain, and sleep. He was told that he could not take anything until this was cleared by a doctor. We discussed this at length. Discussed absolute avoidance of NSAIDs. [place] for physical therapy, etc from a heart perspective. Reviewed 07/2020 hospitalization for GI bleed. ECG 07/14/20 afib RVR with PVC. Only cardiac medication at that visit was metoprolol, and heart rate was 95 bpm. I discussed changing to extended release metoprolol but he declined and would prefer to keep tartrate, as he said this was what Dr. Claiborne Billings had recommended to him. He was difficult to keep on task during the interview. He had chronic LE edema, which he endorsed is from the metoprolol. We discussed chronic venous insufficiency in depth. Could not feel his afib.     PAIN:  Are you having pain? No Location:  Pain rating:  Description:  Aggravating factors:  lower back / buttocks f sitting on hard surface Alleviating factors: medicine   PRECAUTIONS: Fall  WEIGHT BEARING RESTRICTIONS No  FALLS:  Has patient fallen in last 6 months? No  LIVING ENVIRONMENT: Lives with: lives with their spouse but son is 10 minutes away  Lives in: House/apartment Stairs: Yes: Internal: 4 steps; can reach both does not have to go upstairs inside the home  Has following equipment at home: Gilford Rile - 2 wheeled and [employer] - 4 wheeled, shower chair, grab bars   OCCUPATION: retired   PLOF: Independent, Independent with basic ADLs, Independent with household mobility with device, and Requires assistive device for independence  PATIENT GOALS build endurance    OBJECTIVE:  * findings taken at evaluation unless otherwise noted  DIAGNOSTIC FINDINGS:   PATIENT SURVEYS:    COGNITION:  Overall cognitive status:  very tangential and hard to keep on track           POSTURE: rounded shoulders, forward head, decreased lumbar lordosis, increased thoracic kyphosis, and flexed trunk     LOWER EXTREMITY MMT:  MMT Right eval Left eval  Hip flexion 3+ 3+  Hip extension    Hip abduction 3 (sitting) 3 (sitting)  Hip adduction    Hip internal rotation    Hip external rotation    Knee flexion 4 4  Knee extension 4+ 4+  Ankle dorsiflexion 4- 4-  Ankle plantarflexion    Ankle inversion    Ankle eversion     (Blank rows = not tested)   FUNCTIONAL TESTS:  5 times sit to stand: 32 seconds had to use BUEs, very poor eccentric control and often "plopped" onto the table  3 minute walk test: 335f rollator no rest breaks  Tandem stance: unable to hold longer than 9 seconds without BUE support or assist from PT   GAIT: Distance walked: 3284fAssistive device utilized: WaEnvironmental consultant 4 wheeled Level  of assistance: SBA Comments: flexed trunk, tends to push rollator a bit far ahead of himself, slow pace, really easily fatigued   BERG: 25   TODAY'S TREATMENT: 8/22  Reviewed tests and measures see above for objective measures  BERG balance testing  3 min walk test    Last visit   Pt seen for aquatic therapy today.  Water 3.25-4.8 ft in depth. Temp of water was 91.  Pt entered/exited the pool via stairs step to with SBA using bilat handrails.  * using white barbell and intermittent CGA to SBA:    -Multiple laps of forward gait, backward gait, side stepping, VC for posture, step length and speed control.   * Holding wall:   squats, heel raises, hip abdct / add; hip extension; marching * Seated on bench, with feet on blue step-  STS x 7 with UE on white barbell and mod-min assist from therapist to steady; STS onto pool floor x 7 cga, cues to gain position without leaning against bench *seated LAQ x20; cycling (assist from therapist to remain on bench)   Pt requires buoyancy for support and to offload joints with strengthening exercises.  Viscosity of the water is needed for resistance of strengthening; water current perturbations provides challenge to standing balance unsupported, requiring increased core activation.   PATIENT EDUCATION:  Education details: exam findings, POC, HEP, education on general process of water therapy/transition to land  Person educated: Patient and Child(ren) Education method: Explanation Education comprehension: verbalized understanding and needs further education   HOME EXERCISE PROGRAM: Will be assigned next land session   ASSESSMENT:  CLINICAL IMPRESSION: The patient was re-assessed today. His gait distance, strength, and 5x sit to stand have all improved. He has been focusing on water activity. He will require assistance in the water. At this time there doesn't appear to be any long term plan to have someone get in the pool with him. For this reason we will transition him to land based therapy. His BERG score puts him at a fall risk. He has limited mobility and stability in the right knee. Hew would benefit from skilled therapy to decrease his fall risk and develop a long term land program to maintain his current mobility level. He would benefit from further skilled therapy 2W6. See below for goal specific progress.    Pt requires presence of therapist in pool at all times due to decreased balance and decreased confidence in water.    OBJECTIVE IMPAIRMENTS Abnormal gait, cardiopulmonary status limiting activity, decreased activity tolerance, decreased balance, decreased coordination, decreased endurance, decreased knowledge of use of DME, decreased mobility, difficulty walking, decreased strength, decreased safety awareness, increased fascial restrictions, postural dysfunction, and obesity.   ACTIVITY LIMITATIONS carrying, lifting, bending, standing, squatting, stairs, transfers, bed mobility, bathing, locomotion level, and caring for others  PARTICIPATION LIMITATIONS: cleaning, shopping,  and community activity  PERSONAL FACTORS Age, Behavior pattern, Education, Fitness, Social background, and Time since onset of injury/illness/exacerbation are also affecting patient's functional outcome.   REHAB POTENTIAL: Good  CLINICAL DECISION MAKING: Stable/uncomplicated  EVALUATION COMPLEXITY: Low   GOALS: Goals reviewed with patient? Yes  SHORT TERM GOALS: Target date: 06/20/2022  Will be compliant with appropriate HEP  Baseline: Goal status: INITIAL  2.  Will be able to name 3 ways to reduce fall risk at home and in the community  Baseline:  Goal status: INITIAL  3.  Will be able to complete 5x sit to stand test in no more than 20 seconds  Baseline:  Goal status: INITIAL  4.  Will be able to complete all functional transfers safely with good hand placement, general sequencing, and safety awareness  Baseline:  Goal status: INITIAL   LONG TERM GOALS: Target date: 07/18/2022   MMT to improve by at least one grade in all weak groups  Baseline:  Goal status: INITIAL  2.  Will score at least 48 on the Berg to show reduced fall risk  Baseline:  Goal status: INITIAL  3.  Will be able to ambulate at least 579f on 3MWT with LRAD to show improved functional activity tolerance  Baseline:  Goal status: INITIAL  4.  Will be compliant with regular progressive walking program at home  Baseline:  Goal status: INITIAL  5.  Will be compliant with gym based vs advanced HEP at DC to maintain functional gains/prevent recurrence of deconditioning  Baseline:  Goal status: INITIAL    PLAN: PT FREQUENCY: 2x/week  PT DURATION: 8 weeks  PLANNED INTERVENTIONS: Therapeutic exercises, Therapeutic activity, Neuromuscular re-education, Balance training, Gait training, Patient/Family education, Joint mobilization, Stair training, DME instructions, Aquatic Therapy, Cognitive remediation, Cryotherapy, Moist heat, Taping, Ultrasound, Ionotophoresis 457mml Dexamethasone, Manual therapy,  and Re-evaluation  PLAN FOR NEXT SESSION: still needs Berg; work on strength, balance, functional activity tolerance. First 4 weeks of POC in water, second 4 weeks we will transition to land therapy   MaStanton KidneyFTharon AquasZiemba MPT 05/23/22 12:55 PM                      OUTPATIENT PHYSICAL THERAPY LOWER EXTREMITY TREATMENT NOTE   Patient Name: Glen COLLINGTONRN: [id number]:[birth year].o., male Today's Date: 05/23/2022   PT End of Session - 05/23/22 1018     Visit Number 10    Number of Visits 17    Date for PT Re-Evaluation 05/31/22    PT Start Time 109390  PT Stop Time 1056    PT Time Calculation (min) 41 min    Activity Tolerance Patient tolerated treatment well    Behavior During Therapy [facility] tasks assessed/performed              Past Medical History:  Diagnosis Date   [place]   Diverticulitis    Esophageal dysmotility    Family history of diabetes insipidus    FHx: colon cancer    H. pylori infection    Hiatal hernia    History of colonic polyps    Hyperlipidemia    Hypertension    IDA (iron deficiency anemia)    Neoplasm of prostate    special screening malignant    Obesity    Palpitation    Peptic ulcer disease    Presbyesophagus    Schatzki's ring    Past Surgical History:  Procedure Laterality Date   2-D Echocardiogram  November 2009, 2011   Anal Fistula Repair     BALLOON DILATION N/A 01/17/2022   Procedure: BAStacie Acres Surgeon: GuJackquline DenmarkMD;  [facility];  Service: Gastroenterology;  Laterality: N/A;   BIOPSY  07/14/2020   Procedure: BIOPSY;  Surgeon: PyJerene BearsMD;  [facility];  Service: Gastroenterology;;   BIOPSY  01/17/2022   Procedure: BIOPSY;  Surgeon: GuJackquline DenmarkMD;  Location: [facility];  Service: Gastroenterology;;   CARDIAC VALVE SURGERY  1996   [place]   COLONOSCOPY  December 2005    ESOPHAGOGASTRODUODENOSCOPY  07/14/2020   ESOPHAGOGASTRODUODENOSCOPY N/A 01/17/2022   Procedure: ESOPHAGOGASTRODUODENOSCOPY (EGD);  Surgeon: Jackquline Denmark, MD;  Location: [facility];  Service: Gastroenterology;  Laterality: N/A;   ESOPHAGOGASTRODUODENOSCOPY (EGD) WITH PROPOFOL N/A 07/14/2020   Procedure: ESOPHAGOGASTRODUODENOSCOPY (EGD) WITH PROPOFOL;  Surgeon: [name], MD;  Location: [facility] ENDOSCOPY;  Service: Gastroenterology;  Laterality: N/A;   ETT  1991   Negative   HEMOSTASIS CLIP PLACEMENT  07/14/2020   HEMOSTASIS CLIP PLACEMENT   HEMOSTASIS CLIP PLACEMENT  07/14/2020   Procedure: HEMOSTASIS CLIP PLACEMENT;  Surgeon: [name], MD;  Location: [facility] [place] ENDOSCOPY;  Service: Gastroenterology;;   KNEE ARTHROSCOPY     Right   TONSILLECTOMY     Patient Active Problem List   Diagnosis Date Noted   Physical deconditioning 01/16/2022   Dehydration 01/16/2022   Peptic ulcer disease    Fall 07/13/2020   Osteoarthritis 06/25/2019   Onychomycosis 07/29/2018   Esophageal dysphagia 07/29/2018   Peripheral edema 11/18/2015   Impaired glucose tolerance 04/19/2015   Essential hypertension 01/20/2009   Atrial fibrillation, chronic ([place]) 03/13/2008   Dyslipidemia 07/31/2007   History of colonic polyps 07/31/2007    PCP: Dimas Chyle   REFERRING PROVIDER: Buford Dresser, MD   REFERRING DIAG: R53.81 (ICD-10-CM) - Physical deconditioning   THERAPY DIAG:  Muscle weakness (generalized)  Unsteadiness on feet  Other abnormalities of gait and mobility  Rationale for Evaluation and Treatment Rehabilitation  ONSET DATE: 02/02/2022   SUBJECTIVE:   SUBJECTIVE STATEMENT:  "My legs just feel drawn".    Pt reports he believes the rash on his RLE is improving.      PERTINENT HISTORY: PMH: HTN, Afib, OA, HLD, hernia, IDA, neoplasm of prostate, cardiac valve surgery, knee arthroscopy  Glen [name] is a 88 y.o. male with a hx of hypertension, hyperlidemia and permanent AF,  previously on coumadin but now on no anticoagulation who is seen for a post hospital visit.    At his last appointment I reviewed his 07/2020 hospitalization. He was seen for a 7 day transition of care visit. All medications reviewed, as well as review of hospital course and discharge summary. Patient's main concern was for over the counter medications for sinus congestion, allergies, pain, and sleep. He was told that he could not take anything until this was cleared by a doctor. We discussed this at length. Discussed absolute avoidance of NSAIDs. [place] for physical therapy, etc from a heart perspective. Reviewed 07/2020 hospitalization for GI bleed. ECG 07/14/20 afib RVR with PVC. Only cardiac medication at that visit was metoprolol, and heart rate was 95 bpm. I discussed changing to extended release metoprolol but he declined and would prefer to keep tartrate, as he said this was what Dr. Claiborne Billings had recommended to him. He was difficult to keep on task during the interview. He had chronic LE edema, which he endorsed is from the metoprolol. We discussed chronic venous insufficiency in depth. Could not feel his afib.     PAIN:  Are you having pain? No Location: n/a Pain rating:  Description:  Aggravating factors:  lower back / buttocks f sitting on hard surface Alleviating factors: medicine   PRECAUTIONS: Fall  WEIGHT BEARING RESTRICTIONS No  FALLS:  Has patient fallen in last 6 months? No  LIVING ENVIRONMENT: Lives with: lives with their spouse but son is 10 minutes away  Lives in: House/apartment Stairs: Yes: Internal: 4 steps; can reach both does not have to go upstairs inside the home  Has following equipment at home:  Walker - 2 wheeled and [employer] - 4 wheeled, shower chair, grab bars   OCCUPATION: retired   PLOF: Independent, Independent with basic ADLs, Independent with household mobility with device, and Requires assistive device for independence  PATIENT GOALS build endurance     OBJECTIVE:  * findings taken at evaluation unless otherwise noted  DIAGNOSTIC FINDINGS:   PATIENT SURVEYS:    COGNITION:  Overall cognitive status:  very tangential and hard to keep on track          POSTURE: rounded shoulders, forward head, decreased lumbar lordosis, increased thoracic kyphosis, and flexed trunk     LOWER EXTREMITY MMT:  MMT Right eval Left eval Right  Left   Hip flexion 3+ 3+ 4 19.1 4+ 26.9  Hip extension      Hip abduction 3 (sitting) 3 (sitting) 4 17.4 4 26.3  Hip adduction      Hip internal rotation      Hip external rotation      Knee flexion 4 4    Knee extension 4+ 4+ 4+ 15.9 4+ 26.0  Ankle dorsiflexion 4- 4-    Ankle plantarflexion      Ankle inversion      Ankle eversion       (Blank rows = not tested)   FUNCTIONAL TESTS:  5 times sit to stand: 32 seconds had to use BUEs, very poor eccentric control and often "plopped" onto the table  3 minute walk test: 347f rollator no rest breaks  8/22 395' with RW with SBA  Tandem stance: unable to hold longer than 9 seconds without BUE support or assist from PT  8/22 5x sit to stand test 17 seconds with the use of his hands to push up  GAIT: Distance walked: 3976fAssistive device utilized: WaEnvironmental consultant 4 wheeled Level of assistance: SBA Comments: flexed trunk, tends to push rollator a bit far ahead of himself, slow pace, really easily fatigued     TODAY'S TREATMENT: 8/22 Reviewed tests and measures. Reviewed New Goals; reviewed BERG testing     Lat visit:  Pt seen for aquatic therapy today.  Water 3.25-4.8 ft in depth. Temp of water was 91.  Pt entered/exited the pool via stairs step to with SBA using bilat handrails.  * using white barbell and intermittent CGA to SBA:    -Multiple laps of forward gait, backward gait, side stepping, VC for posture, step length and speed control.   * Holding wall:   squats, heel raises, hip abdct / add; hip extension; marching * Seated on bench holding  white barbell:  STS x 7 and CGA intermittently from therapist to steady * holding white barbell without back support- row motion and SBA * holding rainbow hand buoys without back support - bilat shoulder horiz abdct/ add with SBA/ CGA   Pt requires buoyancy for support and to offload joints with strengthening exercises. Viscosity of the water is needed for resistance of strengthening; water current perturbations provides challenge to standing balance unsupported, requiring increased core activation.   PATIENT EDUCATION:  Education details:  general aquatic progression Person educated: Patient and Child(ren) Education method: Explanation Education comprehension: verbalized understanding and needs further education   HOME EXERCISE PROGRAM: Will be assigned next land session   ASSESSMENT:  CLINICAL IMPRESSION:  CLINICAL IMPRESSION: The patient was re-assessed today. His gait distance, strength, and 5x sit to stand have all improved. He has been focusing on water activity. He will require assistance in the water. At this time  there doesn't appear to be any long term plan to have someone get in the pool with him. For this reason we will transition him to land based therapy. His BERG score puts him at a fall risk. He has limited mobility and stability in the right knee. Hew would benefit from skilled therapy to decrease his fall risk and develop a long term land program to maintain his current mobility level. He would benefit from further skilled therapy 2W6. See below for goal specific progress.     OBJECTIVE IMPAIRMENTS Abnormal gait, cardiopulmonary status limiting activity, decreased activity tolerance, decreased balance, decreased coordination, decreased endurance, decreased knowledge of use of DME, decreased mobility, difficulty walking, decreased strength, decreased safety awareness, increased fascial restrictions, postural dysfunction, and obesity.   ACTIVITY LIMITATIONS carrying,  lifting, bending, standing, squatting, stairs, transfers, bed mobility, bathing, locomotion level, and caring for others  PARTICIPATION LIMITATIONS: cleaning, shopping, and community activity  PERSONAL FACTORS Age, Behavior pattern, Education, Fitness, Social background, and Time since onset of injury/illness/exacerbation are also affecting patient's functional outcome.   REHAB POTENTIAL: Good  CLINICAL DECISION MAKING: Stable/uncomplicated  EVALUATION COMPLEXITY: Low   GOALS: Goals reviewed with patient? Yes  SHORT TERM GOALS: Target date: 06/20/2022  Will be compliant with appropriate HEP  Baseline: Goal status: has base pool program but no land program Partially met    2.  Will be able to name 3 ways to reduce fall risk at home and in the community  Baseline:  Goal status: still working   3.  Will be able to complete 5x sit to stand test in no more than 20 seconds  Baseline:  Goal status: 17 Achieved   4.  Will be able to complete all functional transfers safely with good hand placement, general sequencing, and safety awareness  Baseline:  Goal status: still needs cuing    LONG TERM GOALS: Target date: 07/18/2022   MMT to improve by at least one grade in all weak groups  Baseline:  Goal status: Partially met 2.  Will score at least 48 on the Berg to show reduced fall risk  Baseline:  Goal status: 25  3.  Will be able to ambulate at least 553f on 3MWT with LRAD to show improved functional activity tolerance  Baseline:  Goal status: improving   4.  Will be compliant with regular progressive walking program at home  Baseline:  Goal status: INITIAL  5.  Will be compliant with gym based vs advanced HEP at DC to maintain functional gains/prevent recurrence of deconditioning  Baseline:  Goal status: INITIAL    PLAN: PT FREQUENCY: 2x/week  PT DURATION: 8 weeks  PLANNED INTERVENTIONS: Therapeutic exercises, Therapeutic activity, Neuromuscular re-education,  Balance training, Gait training, Patient/Family education, Joint mobilization, Stair training, DME instructions, Aquatic Therapy, Cognitive remediation, Cryotherapy, Moist heat, Taping, Ultrasound, Ionotophoresis 464mml Dexamethasone, Manual therapy, and Re-evaluation  PLAN FOR NEXT SESSION: still needs Berg; work on strength, balance, functional activity tolerance. (First 4 weeks of POC in water, second 4 weeks we will transition to land therapy ).  10th visit progress note. Continue with land therapy.    DaCarolyne LittlesT DPT   05/23/22 12:55 PM

## 2022-05-24 ENCOUNTER — Encounter (HOSPITAL_BASED_OUTPATIENT_CLINIC_OR_DEPARTMENT_OTHER): Payer: Self-pay | Admitting: Cardiology

## 2022-05-25 ENCOUNTER — Ambulatory Visit (HOSPITAL_BASED_OUTPATIENT_CLINIC_OR_DEPARTMENT_OTHER): Payer: Medicare Other | Admitting: Physical Therapy

## 2022-05-25 ENCOUNTER — Encounter (HOSPITAL_BASED_OUTPATIENT_CLINIC_OR_DEPARTMENT_OTHER): Payer: Self-pay | Admitting: Physical Therapy

## 2022-05-25 DIAGNOSIS — R2681 Unsteadiness on feet: Secondary | ICD-10-CM

## 2022-05-25 DIAGNOSIS — M6281 Muscle weakness (generalized): Secondary | ICD-10-CM

## 2022-05-25 DIAGNOSIS — R2689 Other abnormalities of gait and mobility: Secondary | ICD-10-CM

## 2022-05-25 NOTE — Therapy (Signed)
OUTPATIENT PHYSICAL THERAPY LOWER EXTREMITY TREATMENT NOTE/Progress note    Patient Name: Glen [name] MRN: [id number] DOB:[birth year], 88 y.o., male Today's Date: 05/25/2022     PT End of Session - 05/25/22 1346     Visit Number 11    Number of Visits 26    Date for PT Re-Evaluation 07/18/22    Authorization Type progress note on visit 20    Progress Note Due on Visit 20    PT Start Time 1345    PT Stop Time 1425    PT Time Calculation (min) 40 min    Activity Tolerance Patient tolerated treatment well    Behavior During Therapy [facility] for tasks assessed/performed               Past Medical History:  Diagnosis Date   Atrial fibrillation ([place])    Diverticulitis    Esophageal dysmotility    Family history of diabetes insipidus    FHx: colon cancer    H. pylori infection    Hiatal hernia    History of colonic polyps    Hyperlipidemia    Hypertension    IDA (iron deficiency anemia)    Neoplasm of prostate    special screening malignant    Obesity    Palpitation    Peptic ulcer disease    Presbyesophagus    Schatzki's ring    Past Surgical History:  Procedure Laterality Date   2-D Echocardiogram  November 2009, 2011   Anal Fistula Repair     BALLOON DILATION N/A 01/17/2022   Procedure: [place];  Surgeon: Jackquline Denmark, MD;  Location: [facility];  Service: Gastroenterology;  Laterality: N/A;   BIOPSY  07/14/2020   Procedure: BIOPSY;  Surgeon: [name], MD;  Location: [facility];  Service: Gastroenterology;;   BIOPSY  01/17/2022   Procedure: BIOPSY;  Surgeon: Jackquline Denmark, MD;  Location: [facility] ENDOSCOPY;  Service: Gastroenterology;;   CARDIAC VALVE SURGERY  1996   status post balloon valvuloplasty at [place]  2004   COLONOSCOPY  December 2005   ESOPHAGOGASTRODUODENOSCOPY  07/14/2020   ESOPHAGOGASTRODUODENOSCOPY N/A 01/17/2022   Procedure: ESOPHAGOGASTRODUODENOSCOPY (EGD);  Surgeon: Jackquline Denmark, MD;  Location: [facility];   Service: Gastroenterology;  Laterality: N/A;   ESOPHAGOGASTRODUODENOSCOPY (EGD) WITH PROPOFOL N/A 07/14/2020   Procedure: ESOPHAGOGASTRODUODENOSCOPY (EGD) WITH PROPOFOL;  Surgeon: [name], MD;  Location[facility] ENDOSCOPY;  Service: Gastroenterology;  Laterality: N/A;   ETT  1991   Negative   HEMOSTASIS CLIP PLACEMENT  07/14/2020   HEMOSTASIS CLIP PLACEMENT   HEMOSTASIS CLIP PLACEMENT  07/14/2020   Procedure: HEMOSTASIS CLIP PLACEMENT;  Surgeon: [name], MD;  Location: [facility] ENDOSCOPY;  Service: Gastroenterology;;   KNEE ARTHROSCOPY     Right   TONSILLECTOMY     Patient Active Problem List   Diagnosis Date Noted   Physical deconditioning 01/16/2022   Dehydration 01/16/2022   Peptic ulcer disease    Fall 07/13/2020   Osteoarthritis 06/25/2019   Onychomycosis 07/29/2018   Esophageal dysphagia 07/29/2018   Peripheral edema 11/18/2015   Impaired glucose tolerance 04/19/2015   Permanent atrial fibrillation ([place]) 11/07/2010   Essential hypertension 01/20/2009   Atrial fibrillation, chronic ([place]) 03/13/2008   Dyslipidemia 07/31/2007   History of colonic polyps 07/31/2007    PCP: Dimas Chyle   REFERRING PROVIDER: Buford Dresser, MD   REFERRING DIAG: R53.81 (ICD-10-CM) - Physical deconditioning   THERAPY DIAG:  Muscle weakness (generalized)  Unsteadiness on feet  Other abnormalities of  gait and mobility  Rationale for Evaluation and Treatment Rehabilitation  ONSET DATE: 02/02/2022   SUBJECTIVE:   SUBJECTIVE STATEMENT:  My legs feel pretty drawn up.    PERTINENT HISTORY: PMH: HTN, Afib, OA, HLD, hernia, IDA, neoplasm of prostate, cardiac valve surgery, knee arthroscopy  Glen [name] is a 88 y.o. male with a hx of hypertension, hyperlidemia and permanent AF, previously on coumadin but now on no anticoagulation who is seen for a post hospital visit.    At his last appointment I reviewed his 07/2020 hospitalization. He was seen for a 7 day transition of  care visit. All medications reviewed, as well as review of hospital course and discharge summary. Patient's main concern was for over the counter medications for sinus congestion, allergies, pain, and sleep. He was told that he could not take anything until this was cleared by a doctor. We discussed this at length. Discussed absolute avoidance of NSAIDs. [place] for physical therapy, etc from a heart perspective. Reviewed 07/2020 hospitalization for GI bleed. ECG 07/14/20 afib RVR with PVC. Only cardiac medication at that visit was metoprolol, and heart rate was 95 bpm. I discussed changing to extended release metoprolol but he declined and would prefer to keep tartrate, as he said this was what Dr. Claiborne Billings had recommended to him. He was difficult to keep on task during the interview. He had chronic LE edema, which he endorsed is from the metoprolol. We discussed chronic venous insufficiency in depth. Could not feel his afib.     PAIN:  Only pain in my Rt knee when I move it. It is fine when it is still.    PRECAUTIONS: Fall  WEIGHT BEARING RESTRICTIONS No  FALLS:  Has patient fallen in last 6 months? No  LIVING ENVIRONMENT: Lives with: lives with their spouse but son is 10 minutes away  Lives in: House/apartment Stairs: Yes: Internal: 4 steps; can reach both does not have to go upstairs inside the home  Has following equipment at home: Gilford Rile - 2 wheeled and [employer] - 4 wheeled, shower chair, grab bars   OCCUPATION: retired   PLOF: Independent, Independent with basic ADLs, Independent with household mobility with device, and Requires assistive device for independence  PATIENT GOALS build endurance    OBJECTIVE:  * findings taken at evaluation unless otherwise noted   COGNITION:  Overall cognitive status:  very tangential and hard to keep on track      POSTURE: rounded shoulders, forward head, decreased lumbar lordosis, increased thoracic kyphosis, and flexed trunk   LOWER EXTREMITY  MMT:  MMT Right eval Left eval  Hip flexion 3+ 3+  Hip extension    Hip abduction 3 (sitting) 3 (sitting)  Hip adduction    Hip internal rotation    Hip external rotation    Knee flexion 4 4  Knee extension 4+ 4+  Ankle dorsiflexion 4- 4-  Ankle plantarflexion    Ankle inversion    Ankle eversion     (Blank rows = not tested)   FUNCTIONAL TESTS:  5 times sit to stand: 32 seconds had to use BUEs, very poor eccentric control and often "plopped" onto the table  3 minute walk test: 347f rollator no rest breaks  Tandem stance: unable to hold longer than 9 seconds without BUE support or assist from PT   GAIT: Distance walked: [id number] device utilized: WaEnvironmental consultant 4 wheeled Level of assistance: SBA Comments: flexed trunk, tends to push rollator a bit far ahead of himself,  slow pace, really easily fatigued   BERG: 25   TODAY'S TREATMENT: 8/24  Nu step 5 min L6 UE & LE LAQ Sit<>stand with hands on rollator Standing heel raises 3 minute walk- distance 330' x3 with seated rest breaks in between Seated hamstring stretch   PATIENT EDUCATION:  Education details: exam findings, POC, HEP, education on general process of water therapy/transition to land  Person educated: Patient and Child(ren) Education method: Explanation Education comprehension: verbalized understanding and needs further education   HOME EXERCISE PROGRAM: Try to walk regularly [id number]  ASSESSMENT:  CLINICAL IMPRESSION: SOB quickly in exercises and had to pause to take breaths when talking. Multiple rest breaks that were monitored. Consistently able to complete distance walked in 3 min. Notable flat foot strike and limited clearance of heels in heel raises.    Pt requires presence of therapist in pool at all times due to decreased balance and decreased confidence in water.    OBJECTIVE IMPAIRMENTS Abnormal gait, cardiopulmonary status limiting activity, decreased activity tolerance, decreased  balance, decreased coordination, decreased endurance, decreased knowledge of use of DME, decreased mobility, difficulty walking, decreased strength, decreased safety awareness, increased fascial restrictions, postural dysfunction, and obesity.   ACTIVITY LIMITATIONS carrying, lifting, bending, standing, squatting, stairs, transfers, bed mobility, bathing, locomotion level, and caring for others  PARTICIPATION LIMITATIONS: cleaning, shopping, and community activity  PERSONAL FACTORS Age, Behavior pattern, Education, Fitness, Social background, and Time since onset of injury/illness/exacerbation are also affecting patient's functional outcome.   REHAB POTENTIAL: Good  CLINICAL DECISION MAKING: Stable/uncomplicated  EVALUATION COMPLEXITY: Low   GOALS: Goals reviewed with patient? Yes  SHORT TERM GOALS: Target date: 06/22/2022  Will be compliant with appropriate HEP  Baseline: Goal status: INITIAL  2.  Will be able to name 3 ways to reduce fall risk at home and in the community  Baseline:  Goal status: INITIAL  3.  Will be able to complete 5x sit to stand test in no more than 20 seconds  Baseline:  Goal status: INITIAL  4.  Will be able to complete all functional transfers safely with good hand placement, general sequencing, and safety awareness  Baseline:  Goal status: INITIAL   LONG TERM GOALS: Target date: 07/20/2022   MMT to improve by at least one grade in all weak groups  Baseline:  Goal status: INITIAL  2.  Will score at least 48 on the Berg to show reduced fall risk  Baseline:  Goal status: INITIAL  3.  Will be able to ambulate at least 569f on 3MWT with LRAD to show improved functional activity tolerance  Baseline:  Goal status: INITIAL  4.  Will be compliant with regular progressive walking program at home  Baseline:  Goal status: INITIAL  5.  Will be compliant with gym based vs advanced HEP at DC to maintain functional gains/prevent recurrence of  deconditioning  Baseline:  Goal status: INITIAL    PLAN: PT FREQUENCY: 2x/week  PT DURATION: 8 weeks  PLANNED INTERVENTIONS: Therapeutic exercises, Therapeutic activity, Neuromuscular re-education, Balance training, Gait training, Patient/Family education, Joint mobilization, Stair training, DME instructions, Aquatic Therapy, Cognitive remediation, Cryotherapy, Moist heat, Taping, Ultrasound, Ionotophoresis '4mg'$ /ml Dexamethasone, Manual therapy, and Re-evaluation  PLAN FOR NEXT SESSION: monitor SOB, gastroc strength/ankle ROM  Greer Koeppen C. Magin Balbi PT, DPT 05/25/22 3:40 PM

## 2022-05-29 ENCOUNTER — Other Ambulatory Visit (HOSPITAL_BASED_OUTPATIENT_CLINIC_OR_DEPARTMENT_OTHER): Payer: Self-pay

## 2022-05-29 ENCOUNTER — Encounter (HOSPITAL_BASED_OUTPATIENT_CLINIC_OR_DEPARTMENT_OTHER): Payer: Medicare Other | Admitting: Physical Therapy

## 2022-05-30 ENCOUNTER — Ambulatory Visit (HOSPITAL_BASED_OUTPATIENT_CLINIC_OR_DEPARTMENT_OTHER): Payer: Medicare Other | Admitting: Physical Therapy

## 2022-05-30 ENCOUNTER — Encounter (HOSPITAL_BASED_OUTPATIENT_CLINIC_OR_DEPARTMENT_OTHER): Payer: Self-pay | Admitting: Physical Therapy

## 2022-05-30 ENCOUNTER — Other Ambulatory Visit (HOSPITAL_BASED_OUTPATIENT_CLINIC_OR_DEPARTMENT_OTHER): Payer: Self-pay

## 2022-05-30 DIAGNOSIS — M6281 Muscle weakness (generalized): Secondary | ICD-10-CM

## 2022-05-30 DIAGNOSIS — R2681 Unsteadiness on feet: Secondary | ICD-10-CM | POA: Diagnosis not present

## 2022-05-30 DIAGNOSIS — R2689 Other abnormalities of gait and mobility: Secondary | ICD-10-CM | POA: Diagnosis not present

## 2022-05-30 MED ORDER — FUROSEMIDE 20 MG PO TABS
ORAL_TABLET | ORAL | 3 refills | Status: DC
  Filled 2022-05-30: qty 90, 90d supply, fill #0
  Filled 2022-08-23 – 2022-08-30 (×2): qty 90, 90d supply, fill #1

## 2022-05-30 MED ORDER — OMEPRAZOLE 40 MG PO CPDR
DELAYED_RELEASE_CAPSULE | ORAL | 1 refills | Status: DC
  Filled 2022-05-30: qty 180, 90d supply, fill #0

## 2022-05-30 MED ORDER — METOPROLOL TARTRATE 100 MG PO TABS
ORAL_TABLET | ORAL | 2 refills | Status: DC
Start: 2021-08-08 — End: 2022-08-23
  Filled 2022-05-30: qty 180, 90d supply, fill #0

## 2022-05-30 NOTE — Therapy (Signed)
OUTPATIENT PHYSICAL THERAPY LOWER EXTREMITY TREATMENT NOTE/Progress note    Patient Name: Glen [name] MRN: [id number] DOB:11/04/1927, 88 y.o., male Today's Date: 05/30/2022         Past Medical History:  Diagnosis Date   Atrial fibrillation ([place])    Diverticulitis    Esophageal dysmotility    Family history of diabetes insipidus    FHx: colon cancer    H. pylori infection    Hiatal hernia    History of colonic polyps    Hyperlipidemia    Hypertension    IDA (iron deficiency anemia)    Neoplasm of prostate    special screening malignant    Obesity    Palpitation    Peptic ulcer disease    Presbyesophagus    Schatzki's ring    Past Surgical History:  Procedure Laterality Date   2-D Echocardiogram  November 2009, 2011   Anal Fistula Repair     BALLOON DILATION N/A 01/17/2022   Procedure: [place];  Surgeon: Jackquline Denmark, MD;  Location: [facility];  Service: Gastroenterology;  Laterality: N/A;   BIOPSY  07/14/2020   Procedure: BIOPSY;  Surgeon: [name], MD;  Location: [facility] ENDOSCOPY;  Service: Gastroenterology;;   BIOPSY  01/17/2022   Procedure: BIOPSY;  Surgeon: Jackquline Denmark, MD;  Location: [facility] ENDOSCOPY;  Service: Gastroenterology;;   CARDIAC VALVE SURGERY  1996   status post balloon valvuloplasty at [place]  2004   COLONOSCOPY  December 2005   ESOPHAGOGASTRODUODENOSCOPY  07/14/2020   ESOPHAGOGASTRODUODENOSCOPY N/A 01/17/2022   Procedure: ESOPHAGOGASTRODUODENOSCOPY (EGD);  Surgeon: Jackquline Denmark, MD;  Location: [facility];  Service: Gastroenterology;  Laterality: N/A;   ESOPHAGOGASTRODUODENOSCOPY (EGD) WITH PROPOFOL N/A 07/14/2020   Procedure: ESOPHAGOGASTRODUODENOSCOPY (EGD) WITH PROPOFOL;  Surgeon: [name], MD;  Location: [facility] ENDOSCOPY;  Service: Gastroenterology;  Laterality: N/A;   ETT  1991   Negative   HEMOSTASIS CLIP PLACEMENT  07/14/2020   HEMOSTASIS CLIP PLACEMENT   HEMOSTASIS CLIP PLACEMENT  07/14/2020    Procedure: HEMOSTASIS CLIP PLACEMENT;  Surgeon: [name], MD;  Location: [facility] ENDOSCOPY;  Service: Gastroenterology;;   KNEE ARTHROSCOPY     Right   TONSILLECTOMY     Patient Active Problem List   Diagnosis Date Noted   Physical deconditioning 01/16/2022   Dehydration 01/16/2022   Peptic ulcer disease    Fall 07/13/2020   Osteoarthritis 06/25/2019   Onychomycosis 07/29/2018   Esophageal dysphagia 07/29/2018   Peripheral edema 11/18/2015   Impaired glucose tolerance 04/19/2015   Permanent atrial fibrillation ([place]) 11/07/2010   Essential hypertension 01/20/2009   Atrial fibrillation, chronic ([place]) 03/13/2008   Dyslipidemia 07/31/2007   History of colonic polyps 07/31/2007    PCP: Dimas Chyle   REFERRING PROVIDER: Buford Dresser, MD   REFERRING DIAG: R53.81 (ICD-10-CM) - Physical deconditioning   THERAPY DIAG:  No diagnosis found.  Rationale for Evaluation and Treatment Rehabilitation  ONSET DATE: 02/02/2022   SUBJECTIVE:   SUBJECTIVE STATEMENT:  My legs feel pretty drawn up.    PERTINENT HISTORY: PMH: HTN, Afib, OA, HLD, hernia, IDA, neoplasm of prostate, cardiac valve surgery, knee arthroscopy  Glen [name] is a 88 y.o. male with a hx of hypertension, hyperlidemia and permanent AF, previously on coumadin but now on no anticoagulation who is seen for a post hospital visit.    At his last appointment I reviewed his 07/2020 hospitalization. He was seen for a 7 day transition of care visit. All medications reviewed, as well as  review of hospital course and discharge summary. Patient's main concern was for over the counter medications for sinus congestion, allergies, pain, and sleep. He was told that he could not take anything until this was cleared by a doctor. We discussed this at length. Discussed absolute avoidance of NSAIDs. [place] for physical therapy, etc from a heart perspective. Reviewed 07/2020 hospitalization for GI bleed. ECG 07/14/20 afib RVR with PVC.  Only cardiac medication at that visit was metoprolol, and heart rate was 95 bpm. I discussed changing to extended release metoprolol but he declined and would prefer to keep tartrate, as he said this was what Dr. Claiborne Billings had recommended to him. He was difficult to keep on task during the interview. He had chronic LE edema, which he endorsed is from the metoprolol. We discussed chronic venous insufficiency in depth. Could not feel his afib.     PAIN:  Only pain in my Rt knee when I move it. It is fine when it is still.    PRECAUTIONS: Fall  WEIGHT BEARING RESTRICTIONS No  FALLS:  Has patient fallen in last 6 months? No  LIVING ENVIRONMENT: Lives with: lives with their spouse but son is 10 minutes away  Lives in: House/apartment Stairs: Yes: Internal: 4 steps; can reach both does not have to go upstairs inside the home  Has following equipment at home: Gilford Rile - 2 wheeled and [employer] - 4 wheeled, shower chair, grab bars   OCCUPATION: retired   PLOF: Independent, Independent with basic ADLs, Independent with household mobility with device, and Requires assistive device for independence  PATIENT GOALS build endurance    OBJECTIVE:  * findings taken at evaluation unless otherwise noted   COGNITION:  Overall cognitive status:  very tangential and hard to keep on track      POSTURE: rounded shoulders, forward head, decreased lumbar lordosis, increased thoracic kyphosis, and flexed trunk   LOWER EXTREMITY MMT:  MMT Right eval Left eval  Hip flexion 3+ 3+  Hip extension    Hip abduction 3 (sitting) 3 (sitting)  Hip adduction    Hip internal rotation    Hip external rotation    Knee flexion 4 4  Knee extension 4+ 4+  Ankle dorsiflexion 4- 4-  Ankle plantarflexion    Ankle inversion    Ankle eversion     (Blank rows = not tested)   FUNCTIONAL TESTS:  5 times sit to stand: 32 seconds had to use BUEs, very poor eccentric control and often "plopped" onto the table  3 minute  walk test: 341f rollator no rest breaks  Tandem stance: unable to hold longer than 9 seconds without BUE support or assist from PT   GAIT: Distance walked: 3233fAssistive device utilized: WaEnvironmental consultant 4 wheeled Level of assistance: SBA Comments: flexed trunk, tends to push rollator a bit far ahead of himself, slow pace, really easily fatigued   BERG: 25   TODAY'S TREATMENT: 8/24  Nu step 5 min L6 UE & LE LAQ Sit<>stand with hands on rollator Standing heel raises 3 minute walk- distance 330' x3 with seated rest breaks in between Seated hamstring stretch   PATIENT EDUCATION:  Education details: exam findings, POC, HEP, education on general process of water therapy/transition to land  Person educated: Patient and Child(ren) Education method: Explanation Education comprehension: verbalized understanding and needs further education   HOME EXERCISE PROGRAM: Try to walk regularly [id number]:  CLINICAL IMPRESSION: SOB quickly in exercises and had to pause to take breaths when  talking. Multiple rest breaks that were monitored. Consistently able to complete distance walked in 3 min. Notable flat foot strike and limited clearance of heels in heel raises.    Pt requires presence of therapist in pool at all times due to decreased balance and decreased confidence in water.    OBJECTIVE IMPAIRMENTS Abnormal gait, cardiopulmonary status limiting activity, decreased activity tolerance, decreased balance, decreased coordination, decreased endurance, decreased knowledge of use of DME, decreased mobility, difficulty walking, decreased strength, decreased safety awareness, increased fascial restrictions, postural dysfunction, and obesity.   ACTIVITY LIMITATIONS carrying, lifting, bending, standing, squatting, stairs, transfers, bed mobility, bathing, locomotion level, and caring for others  PARTICIPATION LIMITATIONS: cleaning, shopping, and community activity  PERSONAL FACTORS  Age, Behavior pattern, Education, Fitness, Social background, and Time since onset of injury/illness/exacerbation are also affecting patient's functional outcome.   REHAB POTENTIAL: Good  CLINICAL DECISION MAKING: Stable/uncomplicated  EVALUATION COMPLEXITY: Low   GOALS: Goals reviewed with patient? Yes  SHORT TERM GOALS: Target date: 06/27/2022  Will be compliant with appropriate HEP  Baseline: Goal status: INITIAL  2.  Will be able to name 3 ways to reduce fall risk at home and in the community  Baseline:  Goal status: INITIAL  3.  Will be able to complete 5x sit to stand test in no more than 20 seconds  Baseline:  Goal status: INITIAL  4.  Will be able to complete all functional transfers safely with good hand placement, general sequencing, and safety awareness  Baseline:  Goal status: INITIAL   LONG TERM GOALS: Target date: 07/25/2022   MMT to improve by at least one grade in all weak groups  Baseline:  Goal status: INITIAL  2.  Will score at least 48 on the Berg to show reduced fall risk  Baseline:  Goal status: INITIAL  3.  Will be able to ambulate at least 513f on 3MWT with LRAD to show improved functional activity tolerance  Baseline:  Goal status: INITIAL  4.  Will be compliant with regular progressive walking program at home  Baseline:  Goal status: INITIAL  5.  Will be compliant with gym based vs advanced HEP at DC to maintain functional gains/prevent recurrence of deconditioning  Baseline:  Goal status: INITIAL    PLAN: PT FREQUENCY: 2x/week  PT DURATION: 8 weeks  PLANNED INTERVENTIONS: Therapeutic exercises, Therapeutic activity, Neuromuscular re-education, Balance training, Gait training, Patient/Family education, Joint mobilization, Stair training, DME instructions, Aquatic Therapy, Cognitive remediation, Cryotherapy, Moist heat, Taping, Ultrasound, Ionotophoresis '4mg'$ /ml Dexamethasone, Manual therapy, and Re-evaluation  PLAN FOR NEXT  SESSION: monitor SOB, gastroc strength/ankle ROM  Jessica C. Hightower PT, DPT 05/30/22 10:29 AM                    OUTPATIENT PHYSICAL THERAPY LOWER EXTREMITY TREATMENT NOTE/Progress note    Patient Name: Glen DORISMRN: [id number]:5August 15, 1929 88y.o., male Today's Date: 05/30/2022         Past Medical History:  Diagnosis Date   Atrial fibrillation ([place]    Diverticulitis    Esophageal dysmotility    Family history of diabetes insipidus    FHx: colon cancer    H. pylori infection    Hiatal hernia    History of colonic polyps    Hyperlipidemia    Hypertension    IDA (iron deficiency anemia)    Neoplasm of prostate    special screening malignant    Obesity    Palpitation    Peptic ulcer disease  Presbyesophagus    Schatzki's ring    Past Surgical History:  Procedure Laterality Date   2-D Echocardiogram  November 2009, 2011   Anal Fistula Repair     BALLOON DILATION N/A 01/17/2022   Procedure: Larrie Kass DILATION;  Surgeon: Jackquline Denmark, MD;  Location: [facility] ENDOSCOPY;  Service: Gastroenterology;  Laterality: N/A;   BIOPSY  07/14/2020   Procedure: BIOPSY;  Surgeon: [name], MD;  Location: [facility]COPY;  Service: Gastroenterology;;   BIOPSY  01/17/2022   Procedure: BIOPSY;  Surgeon: Jackquline Denmark, MD;  Location: [facility] ENDOSCOPY;  Service: Gastroenterology;;   CARDIAC VALVE SURGERY  1996   status post balloon valvuloplasty at [place]  2004   COLONOSCOPY  December 2005   ESOPHAGOGASTRODUODENOSCOPY  07/14/2020   ESOPHAGOGASTRODUODENOSCOPY N/A 01/17/2022   Procedure: ESOPHAGOGASTRODUODENOSCOPY (EGD);  Surgeon: Jackquline Denmark, MD;  Location: [facility];  Service: Gastroenterology;  Laterality: N/A;   ESOPHAGOGASTRODUODENOSCOPY (EGD) WITH PROPOFOL N/A 07/14/2020   Procedure: ESOPHAGOGASTRODUODENOSCOPY (EGD) WITH PROPOFOL;  Surgeon: [name], MD;  Location: [facility] ENDOSCOPY;  Service: Gastroenterology;  Laterality: N/A;   ETT   1991   Negative   HEMOSTASIS CLIP PLACEMENT  07/14/2020   HEMOSTASIS CLIP PLACEMENT   HEMOSTASIS CLIP PLACEMENT  07/14/2020   Procedure: HEMOSTASIS CLIP PLACEMENT;  Surgeon: [name], MD;  Location: [facility] ENDOSCOPY;  Service: Gastroenterology;;   KNEE ARTHROSCOPY     Right   TONSILLECTOMY     Patient Active Problem List   Diagnosis Date Noted   Physical deconditioning 01/16/2022   Dehydration 01/16/2022   Peptic ulcer disease    Fall 07/13/2020   Osteoarthritis 06/25/2019   Onychomycosis 07/29/2018   Esophageal dysphagia 07/29/2018   Peripheral edema 11/18/2015   Impaired glucose tolerance 04/19/2015   Permanent atrial fibrillation ([place]) 11/07/2010   Essential hypertension 01/20/2009   Atrial fibrillation, chronic ([place]) 03/13/2008   Dyslipidemia 07/31/2007   History of colonic polyps 07/31/2007    PCP: Dimas Chyle   REFERRING PROVIDER: Buford Dresser, MD   REFERRING DIAG: R53.81 (ICD-10-CM) - Physical deconditioning   THERAPY DIAG:  No diagnosis found.  Rationale for Evaluation and Treatment Rehabilitation  ONSET DATE: 02/02/2022   SUBJECTIVE:   SUBJECTIVE STATEMENT:  My legs feel pretty drawn up.    PERTINENT HISTORY: PMH: HTN, Afib, OA, HLD, hernia, IDA, neoplasm of prostate, cardiac valve surgery, knee arthroscopy  Glen [name] is a 88 y.o. male with a hx of hypertension, hyperlidemia and permanent AF, previously on coumadin but now on no anticoagulation who is seen for a post hospital visit.    At his last appointment I reviewed his 07/2020 hospitalization. He was seen for a 7 day transition of care visit. All medications reviewed, as well as review of hospital course and discharge summary. Patient's main concern was for over the counter medications for sinus congestion, allergies, pain, and sleep. He was told that he could not take anything until this was cleared by a doctor. We discussed this at length. Discussed absolute avoidance of NSAIDs. [place]  for physical therapy, etc from a heart perspective. Reviewed 07/2020 hospitalization for GI bleed. ECG 07/14/20 afib RVR with PVC. Only cardiac medication at that visit was metoprolol, and heart rate was 95 bpm. I discussed changing to extended release metoprolol but he declined and would prefer to keep tartrate, as he said this was what Dr. Claiborne Billings had recommended to him. He was difficult to keep on task during the interview. He had chronic LE edema, which  he endorsed is from the metoprolol. We discussed chronic venous insufficiency in depth. Could not feel his afib.     PAIN:  Only pain in my Rt knee when I move it. It is fine when it is still.    PRECAUTIONS: Fall  WEIGHT BEARING RESTRICTIONS No  FALLS:  Has patient fallen in last 6 months? No  LIVING ENVIRONMENT: Lives with: lives with their spouse but son is 10 minutes away  Lives in: House/apartment Stairs: Yes: Internal: 4 steps; can reach both does not have to go upstairs inside the home  Has following equipment at home: Gilford Rile - 2 wheeled and [employer] - 4 wheeled, shower chair, grab bars   OCCUPATION: retired   PLOF: Independent, Independent with basic ADLs, Independent with household mobility with device, and Requires assistive device for independence  PATIENT GOALS build endurance    OBJECTIVE:  * findings taken at evaluation unless otherwise noted   COGNITION:  Overall cognitive status:  very tangential and hard to keep on track      POSTURE: rounded shoulders, forward head, decreased lumbar lordosis, increased thoracic kyphosis, and flexed trunk   LOWER EXTREMITY MMT:  MMT Right eval Left eval  Hip flexion 3+ 3+  Hip extension    Hip abduction 3 (sitting) 3 (sitting)  Hip adduction    Hip internal rotation    Hip external rotation    Knee flexion 4 4  Knee extension 4+ 4+  Ankle dorsiflexion 4- 4-  Ankle plantarflexion    Ankle inversion    Ankle eversion     (Blank rows = not tested)   FUNCTIONAL  TESTS:  5 times sit to stand: 32 seconds had to use BUEs, very poor eccentric control and often "plopped" onto the table  3 minute walk test: 355f rollator no rest breaks  Tandem stance: unable to hold longer than 9 seconds without BUE support or assist from PT   GAIT: Distance walked: 3235fAssistive device utilized: WaEnvironmental consultant 4 wheeled Level of assistance: SBA Comments: flexed trunk, tends to push rollator a bit far ahead of himself, slow pace, really easily fatigued   BERG: 25   TODAY'S TREATMENT: 8/24  Nu step 5 min L6 UE & LE LAQ Sit<>stand with hands on rollator Standing heel raises 3 minute walk- distance 330' x3 with seated rest breaks in between Seated hamstring stretch   PATIENT EDUCATION:  Education details: exam findings, POC, HEP, education on general process of water therapy/transition to land  Person educated: Patient and Child(ren) Education method: Explanation Education comprehension: verbalized understanding and needs further education   HOME EXERCISE PROGRAM: Try to walk regularly [id number]:  CLINICAL IMPRESSION: SOB quickly in exercises and had to pause to take breaths when talking. Multiple rest breaks that were monitored. Consistently able to complete distance walked in 3 min. Notable flat foot strike and limited clearance of heels in heel raises.    Pt requires presence of therapist in pool at all times due to decreased balance and decreased confidence in water.    OBJECTIVE IMPAIRMENTS Abnormal gait, cardiopulmonary status limiting activity, decreased activity tolerance, decreased balance, decreased coordination, decreased endurance, decreased knowledge of use of DME, decreased mobility, difficulty walking, decreased strength, decreased safety awareness, increased fascial restrictions, postural dysfunction, and obesity.   ACTIVITY LIMITATIONS carrying, lifting, bending, standing, squatting, stairs, transfers, bed mobility, bathing,  locomotion level, and caring for others  PARTICIPATION LIMITATIONS: cleaning, shopping, and community activity  PERSONAL FACTORS Age, Behavior pattern, Education, Fitness,  Social background, and Time since onset of injury/illness/exacerbation are also affecting patient's functional outcome.   REHAB POTENTIAL: Good  CLINICAL DECISION MAKING: Stable/uncomplicated  EVALUATION COMPLEXITY: Low   GOALS: Goals reviewed with patient? Yes  SHORT TERM GOALS: Target date: 06/27/2022  Will be compliant with appropriate HEP  Baseline: Goal status: INITIAL  2.  Will be able to name 3 ways to reduce fall risk at home and in the community  Baseline:  Goal status: INITIAL  3.  Will be able to complete 5x sit to stand test in no more than 20 seconds  Baseline:  Goal status: INITIAL  4.  Will be able to complete all functional transfers safely with good hand placement, general sequencing, and safety awareness  Baseline:  Goal status: INITIAL   LONG TERM GOALS: Target date: 07/25/2022   MMT to improve by at least one grade in all weak groups  Baseline:  Goal status: INITIAL  2.  Will score at least 48 on the Berg to show reduced fall risk  Baseline:  Goal status: INITIAL  3.  Will be able to ambulate at least 575f on 3MWT with LRAD to show improved functional activity tolerance  Baseline:  Goal status: INITIAL  4.  Will be compliant with regular progressive walking program at home  Baseline:  Goal status: INITIAL  5.  Will be compliant with gym based vs advanced HEP at DC to maintain functional gains/prevent recurrence of deconditioning  Baseline:  Goal status: INITIAL    PLAN: PT FREQUENCY: 2x/week  PT DURATION: 8 weeks  PLANNED INTERVENTIONS: Therapeutic exercises, Therapeutic activity, Neuromuscular re-education, Balance training, Gait training, Patient/Family education, Joint mobilization, Stair training, DME instructions, Aquatic Therapy, Cognitive remediation,  Cryotherapy, Moist heat, Taping, Ultrasound, Ionotophoresis '4mg'$ /ml Dexamethasone, Manual therapy, and Re-evaluation  PLAN FOR NEXT SESSION: monitor SOB, gastroc strength/ankle ROM  Jessica C. Hightower PT, DPT 05/30/22 10:31 AM      OUTPATIENT PHYSICAL THERAPY LOWER EXTREMITY TREATMENT NOTE/Progress note    Patient Name: Glen TOOTLEMRN: [id number]:5July 08, 1929 88y.o., male Today's Date: 05/30/2022         Past Medical History:  Diagnosis Date   [place]rial [place] ([place]    Diverticulitis    Esophageal dysmotility    Family history of diabetes insipidus    FHx: colon cancer    H. pylori infection    Hiatal hernia    History of colonic polyps    Hyperlipidemia    Hypertension    IDA (iron deficiency anemia)    Neoplasm of prostate    special screening malignant    Obesity    Palpitation    Peptic ulcer disease    Presbyesophagus    Schatzki's ring    Past Surgical History:  Procedure Laterality Date   2-D Echocardiogram  November 2009, 2011   Anal Fistula Repair     BALLOON DILATION N/A 01/17/2022   Procedure: [place]acie Ac[place]  Surgeon: GJackquline Denmark MD;  [facility];  Service: Gastroenterology;  Laterality: N/A;   BIOPSY  07/14/2020   Procedure: BIOPSY;  Surgeon: PJerene Bears MD;  [facility]  Service: Gastroenterology;;   BIOPSY  01/17/2022   Procedure: BIOPSY;  Surgeon: GJackquline Denmark MD;  [facility];  Service: Gastroenterology;;   CARDIAC VALVE SURGERY  1996   [place]   COLONOSCOPY  December 2005   ESOPHAGOGASTRODUODENOSCOPY  07/14/2020   ESOPHAGOGASTRODUODENOSCOPY N/A 01/17/2022   Procedure: ESOPHAGOGASTRODUODENOSCOPY (  EGD);  Surgeon: Jackquline Denmark, MD;  Location[facility];  Service: Gastroenterology;  Laterality: N/A;   ESOPHAGOGASTRODUODENOSCOPY (EGD) WITH PROPOFOL N/A 07/14/2020   Procedure: ESOPHAGOGASTRODUODENOSCOPY (EGD) WITH PROPOFOL;   Surgeon: [name], MD;  Location: [facility] ENDOSCOPY;  Service: Gastroenterology;  Laterality: N/A;   ETT  1991   Negative   HEMOSTASIS CLIP PLACEMENT  07/14/2020   HEMOSTASIS CLIP PLACEMENT   HEMOSTASIS CLIP PLACEMENT  07/14/2020   Procedure: HEMOSTASIS CLIP PLACEMENT;  Surgeon: [name], MD;  Location: [facility] ENDOSCOPY;  Service: Gastroenterology;;   KNEE ARTHROSCOPY     Right   TONSILLECTOMY     Patient Active Problem List   Diagnosis Date Noted   Physical deconditioning 01/16/2022   Dehydration 01/16/2022   Peptic ulcer disease    Fall 07/13/2020   Osteoarthritis 06/25/2019   Onychomycosis 07/29/2018   Esophageal dysphagia 07/29/2018   Peripheral edema 11/18/2015   Impaired glucose tolerance 04/19/2015   Permanent atrial fibrillation ([place]) 11/07/2010   Essential hypertension 01/20/2009   Atrial fibrillation, chronic ([place]) 03/13/2008   Dyslipidemia 07/31/2007   History of colonic polyps 07/31/2007    PCP: Dimas Chyle   REFERRING PROVIDER: Buford Dresser, MD   REFERRING DIAG: R53.81 (ICD-10-CM) - Physical deconditioning   THERAPY DIAG:  No diagnosis found.  Rationale for Evaluation and Treatment Rehabilitation  ONSET DATE: 02/02/2022   SUBJECTIVE:   SUBJECTIVE STATEMENT:  My legs feel pretty drawn up.    PERTINENT HISTORY: PMH: HTN, Afib, OA, HLD, hernia, IDA, neoplasm of prostate, cardiac valve surgery, knee arthroscopy  Glen [name] is a 88 y.o. male with a hx of hypertension, hyperlidemia and permanent AF, previously on coumadin but now on no anticoagulation who is seen for a post hospital visit.    At his last appointment I reviewed his 07/2020 hospitalization. He was seen for a 7 day transition of care visit. All medications reviewed, as well as review of hospital course and discharge summary. Patient's main concern was for over the counter medications for sinus congestion, allergies, pain, and sleep. He was told that he could not take anything  until this was cleared by a doctor. We discussed this at length. Discussed absolute avoidance of NSAIDs. [place] for physical therapy, etc from a heart perspective. Reviewed 07/2020 hospitalization for GI bleed. ECG 07/14/20 afib RVR with PVC. Only cardiac medication at that visit was metoprolol, and heart rate was 95 bpm. I discussed changing to extended release metoprolol but he declined and would prefer to keep tartrate, as he said this was what Dr. Claiborne Billings had recommended to him. He was difficult to keep on task during the interview. He had chronic LE edema, which he endorsed is from the metoprolol. We discussed chronic venous insufficiency in depth. Could not feel his afib.     PAIN:  Only pain in my Rt knee when I move it. It is fine when it is still.    PRECAUTIONS: Fall  WEIGHT BEARING RESTRICTIONS No  FALLS:  Has patient fallen in last 6 months? No  LIVING ENVIRONMENT: Lives with: lives with their spouse but son is 10 minutes away  Lives in: House/apartment Stairs: Yes: Internal: 4 steps; can reach both does not have to go upstairs inside the home  Has following equipment at home: Gilford Rile - 2 wheeled and [employer] - 4 wheeled, shower chair, grab bars   OCCUPATION: retired   PLOF: Independent, Independent with basic ADLs, Independent with household mobility with device, and Requires assistive device for independence  PATIENT GOALS build endurance    OBJECTIVE:  * findings taken at evaluation unless otherwise noted   COGNITION:  Overall cognitive status:  very tangential and hard to keep on track      POSTURE: rounded shoulders, forward head, decreased lumbar lordosis, increased thoracic kyphosis, and flexed trunk   LOWER EXTREMITY MMT:  MMT Right eval Left eval  Hip flexion 3+ 3+  Hip extension    Hip abduction 3 (sitting) 3 (sitting)  Hip adduction    Hip internal rotation    Hip external rotation    Knee flexion 4 4  Knee extension 4+ 4+  Ankle dorsiflexion 4- 4-   Ankle plantarflexion    Ankle inversion    Ankle eversion     (Blank rows = not tested)   FUNCTIONAL TESTS:  5 times sit to stand: 32 seconds had to use BUEs, very poor eccentric control and often "plopped" onto the table  3 minute walk test: 379f rollator no rest breaks  Tandem stance: unable to hold longer than 9 seconds without BUE support or assist from PT   GAIT: Distance walked: 3247fAssistive device utilized: WaEnvironmental consultant 4 wheeled Level of assistance: SBA Comments: flexed trunk, tends to push rollator a bit far ahead of himself, slow pace, really easily fatigued   BERG: 25   TODAY'S TREATMENT: 8/29 Nu step 6 min L3 UE & LE Bilateral er 2x10 yellow  Horizontal abduction 2x10 yellow  Bilateral shoulder flexion 2x10 yellow     8/24  Nu step 5 min L6 UE & LE LAQ Sit<>stand with hands on rollator Standing heel raises 3 minute walk- distance 330' x3 with seated rest breaks in between Seated hamstring stretch   PATIENT EDUCATION:  Education details: exam findings, POC, HEP, education on general process of water therapy/transition to land  Person educated: Patient and Child(ren) Education method: Explanation Education comprehension: verbalized understanding and needs further education   HOME EXERCISE PROGRAM: Try to walk regularly [id number]:  CLINICAL IMPRESSION: The patient was given an upper body series of exercises to work on. He tolerated well.    Pt requires presence of therapist in pool at all times due to decreased balance and decreased confidence in water.    OBJECTIVE IMPAIRMENTS Abnormal gait, cardiopulmonary status limiting activity, decreased activity tolerance, decreased balance, decreased coordination, decreased endurance, decreased knowledge of use of DME, decreased mobility, difficulty walking, decreased strength, decreased safety awareness, increased fascial restrictions, postural dysfunction, and obesity.   ACTIVITY LIMITATIONS  carrying, lifting, bending, standing, squatting, stairs, transfers, bed mobility, bathing, locomotion level, and caring for others  PARTICIPATION LIMITATIONS: cleaning, shopping, and community activity  PERSONAL FACTORS Age, Behavior pattern, Education, Fitness, Social background, and Time since onset of injury/illness/exacerbation are also affecting patient's functional outcome.   REHAB POTENTIAL: Good  CLINICAL DECISION MAKING: Stable/uncomplicated  EVALUATION COMPLEXITY: Low   GOALS: Goals reviewed with patient? Yes  SHORT TERM GOALS: Target date: 06/27/2022  Will be compliant with appropriate HEP  Baseline: Goal status: INITIAL  2.  Will be able to name 3 ways to reduce fall risk at home and in the community  Baseline:  Goal status: INITIAL  3.  Will be able to complete 5x sit to stand test in no more than 20 seconds  Baseline:  Goal status: INITIAL  4.  Will be able to complete all functional transfers safely with good hand placement, general sequencing, and safety awareness  Baseline:  Goal status: INITIAL   LONG TERM GOALS:  Target date: 07/25/2022   MMT to improve by at least one grade in all weak groups  Baseline:  Goal status: INITIAL  2.  Will score at least 48 on the Berg to show reduced fall risk  Baseline:  Goal status: INITIAL  3.  Will be able to ambulate at least 571f on 3MWT with LRAD to show improved functional activity tolerance  Baseline:  Goal status: INITIAL  4.  Will be compliant with regular progressive walking program at home  Baseline:  Goal status: INITIAL  5.  Will be compliant with gym based vs advanced HEP at DC to maintain functional gains/prevent recurrence of deconditioning  Baseline:  Goal status: INITIAL    PLAN: PT FREQUENCY: 2x/week  PT DURATION: 8 weeks  PLANNED INTERVENTIONS: Therapeutic exercises, Therapeutic activity, Neuromuscular re-education, Balance training, Gait training, Patient/Family education, Joint  mobilization, Stair training, DME instructions, Aquatic Therapy, Cognitive remediation, Cryotherapy, Moist heat, Taping, Ultrasound, Ionotophoresis '4mg'$ /ml Dexamethasone, Manual therapy, and Re-evaluation  PLAN FOR NEXT SESSION: monitor SOB, gastroc strength/ankle ROM  Jessica C. Hightower PT, DPT 05/30/22 10:31 AM

## 2022-05-31 ENCOUNTER — Encounter (HOSPITAL_BASED_OUTPATIENT_CLINIC_OR_DEPARTMENT_OTHER): Payer: Self-pay | Admitting: Physical Therapy

## 2022-06-01 ENCOUNTER — Ambulatory Visit (HOSPITAL_BASED_OUTPATIENT_CLINIC_OR_DEPARTMENT_OTHER): Payer: Medicare Other | Admitting: Physical Therapy

## 2022-06-01 ENCOUNTER — Encounter (HOSPITAL_BASED_OUTPATIENT_CLINIC_OR_DEPARTMENT_OTHER): Payer: Self-pay | Admitting: Physical Therapy

## 2022-06-01 DIAGNOSIS — M6281 Muscle weakness (generalized): Secondary | ICD-10-CM | POA: Diagnosis not present

## 2022-06-01 DIAGNOSIS — R2689 Other abnormalities of gait and mobility: Secondary | ICD-10-CM

## 2022-06-01 DIAGNOSIS — R2681 Unsteadiness on feet: Secondary | ICD-10-CM

## 2022-06-01 NOTE — Therapy (Signed)
OUTPATIENT PHYSICAL THERAPY LOWER EXTREMITY TREATMENT NOTE/Progress note    Patient Name: Glen [name] MRN: [id number] DOB:12/30/1927, 88 y.o., male Today's Date: 05/30/2022     PT End of Session - 06/01/22 1018     Visit Number 13    Number of Visits 26    Date for PT Re-Evaluation 07/18/22    PT Start Time 5631    PT Stop Time 1057    PT Time Calculation (min) 42 min    Activity Tolerance Patient tolerated treatment well    Behavior During Therapy [facility] for tasks assessed/performed                Past Medical History:  Diagnosis Date   Atrial fibrillation ([place])    Diverticulitis    Esophageal dysmotility    Family history of diabetes insipidus    FHx: colon cancer    H. pylori infection    Hiatal hernia    History of colonic polyps    Hyperlipidemia    Hypertension    IDA (iron deficiency anemia)    Neoplasm of prostate    special screening malignant    Obesity    Palpitation    Peptic ulcer disease    Presbyesophagus    Schatzki's ring    Past Surgical History:  Procedure Laterality Date   2-D Echocardiogram  November 2009, 2011   Anal Fistula Repair     BALLOON DILATION N/A 01/17/2022   Procedure: Glen [name];  Surgeon: Glen Denmark, MD;  Location: [facility];  Service: Gastroenterology;  Laterality: N/A;   BIOPSY  07/14/2020   Procedure: BIOPSY;  Surgeon: Glen Bears, MD;  Location: [facility] ENDOSCOPY;  Service: Gastroenterology;;   BIOPSY  01/17/2022   Procedure: BIOPSY;  Surgeon: Glen Denmark, MD;  Location: [facility] ENDOSCOPY;  Service: Gastroenterology;;   CARDIAC VALVE SURGERY  1996   status post balloon valvuloplasty at [place]  2004   COLONOSCOPY  December 2005   ESOPHAGOGASTRODUODENOSCOPY  07/14/2020   ESOPHAGOGASTRODUODENOSCOPY N/A 01/17/2022   Procedure: ESOPHAGOGASTRODUODENOSCOPY (EGD);  Surgeon: Glen Denmark, MD;  Location: [facility];  Service: Gastroenterology;  Laterality: N/A;   ESOPHAGOGASTRODUODENOSCOPY (EGD)  WITH PROPOFOL N/A 07/14/2020   Procedure: ESOPHAGOGASTRODUODENOSCOPY (EGD) WITH PROPOFOL;  Surgeon: Glen Bears, MD;  [facility] END[facility]COPY;  Service: Gastroenterology;  Laterality: N/A;   ETT  1991   Negative   HEMOSTASIS CLIP PLACEMENT  07/14/2020   HEMOSTASIS CLIP PLACEMENT   HEMOSTASIS CLIP PLACEMENT  07/14/2020   Procedure: HEMOSTASIS CLIP PLACEMENT;  Surgeon: Glen Bears, MD;  Location: [facility] ENDOSCOPY;  Service: Gastroenterology;;   KNEE ARTHROSCOPY     Right   TONSILLECTOMY     Patient Active Problem List   Diagnosis Date Noted   Physical deconditioning 01/16/2022   Dehydration 01/16/2022   Peptic ulcer disease    Fall 07/13/2020   Osteoarthritis 06/25/2019   Onychomycosis 07/29/2018   Esophageal dysphagia 07/29/2018   Peripheral edema 11/18/2015   Impaired glucose tolerance 04/19/2015   Permanent atrial fibrillation ([place]) 11/07/2010   Essential hypertension 01/20/2009   Atrial fibrillation, chronic ([place]) 03/13/2008   Dyslipidemia 07/31/2007   History of colonic polyps 07/31/2007    PCP: Glen [name]   REFERRING PROVIDER: Buford Dresser, MD   REFERRING DIAG: R53.81 (ICD-10-CM) - Physical deconditioning   THERAPY DIAG:  Muscle weakness (generalized)  Unsteadiness on feet  Other abnormalities of gait and mobility  Rationale for Evaluation and Treatment Rehabilitation  ONSET DATE: 02/02/2022   SUBJECTIVE:  SUBJECTIVE STATEMENT:  Patient reports he is feeling pretty good except for his knee.    PERTINENT HISTORY: PMH: HTN, Afib, OA, HLD, hernia, IDA, neoplasm of prostate, cardiac valve surgery, knee arthroscopy  Glen [name] is a 88 y.o. male with a hx of hypertension, hyperlidemia and permanent AF, previously on coumadin but now on no anticoagulation who is seen for a post hospital visit.    At his last appointment I reviewed his 07/2020 hospitalization. He was seen for a 7 day transition of care visit. All medications reviewed, as well as  review of hospital course and discharge summary. Patient's main concern was for over the counter medications for sinus congestion, allergies, pain, and sleep. He was told that he could not take anything until this was cleared by a doctor. We discussed this at length. Discussed absolute avoidance of NSAIDs. [place] for physical therapy, etc from a heart perspective. Reviewed 07/2020 hospitalization for GI bleed. ECG 07/14/20 afib RVR with PVC. Only cardiac medication at that visit was metoprolol, and heart rate was 95 bpm. I discussed changing to extended release metoprolol but he declined and would prefer to keep tartrate, as he said this was what Dr. Claiborne [name] had recommended to him. He was difficult to keep on task during the interview. He had chronic LE edema, which he endorsed is from the metoprolol. We discussed chronic venous insufficiency in depth. Could not feel his afib.     PAIN:  Only pain in my Rt knee when I move it. It is fine when it is still.    PRECAUTIONS: Fall  WEIGHT BEARING RESTRICTIONS No  FALLS:  Has patient fallen in last 6 months? No  LIVING ENVIRONMENT: Lives with: lives with their spouse but son is 10 minutes away  Lives in: House/apartment Stairs: Yes: Internal: 4 steps; can reach both does not have to go upstairs inside the home  Has following equipment at home: Gilford Rile - 2 wheeled and [employer] - 4 wheeled, shower chair, grab bars   OCCUPATION: retired   PLOF: Independent, Independent with basic ADLs, Independent with household mobility with device, and Requires assistive device for independence  PATIENT GOALS build endurance    OBJECTIVE:  * findings taken at evaluation unless otherwise noted   COGNITION:  Overall cognitive status:  very tangential and hard to keep on track      POSTURE: rounded shoulders, forward head, decreased lumbar lordosis, increased thoracic kyphosis, and flexed trunk   LOWER EXTREMITY MMT:  MMT Right eval Left eval  Hip flexion 3+ 3+   Hip extension    Hip abduction 3 (sitting) 3 (sitting)  Hip adduction    Hip internal rotation    Hip external rotation    Knee flexion 4 4  Knee extension 4+ 4+  Ankle dorsiflexion 4- 4-  Ankle plantarflexion    Ankle inversion    Ankle eversion     (Blank rows = not tested)   FUNCTIONAL TESTS:  5 times sit to stand: 32 seconds had to use BUEs, very poor eccentric control and often "plopped" onto the table  3 minute walk test: 397f rollator no rest breaks  Tandem stance: unable to hold longer than 9 seconds without BUE support or assist from PT   GAIT: Distance walked: 3266fAssistive device utilized: WaEnvironmental consultant 4 wheeled Level of assistance: SBA Comments: flexed trunk, tends to push rollator a bit far ahead of himself, slow pace, really easily fatigued   BERG: 25   TODAY'S TREATMENT: 8/31  LTR x20  Supine march 2x10  Supine SLR 2x10 each leg  Supine clam shell 2x15 green   Nu-step 5 min L3     Narrow base of support eo and ec 3x30 sec hold  Tandem stance 2x30 each leg forward  Rhythmic stepping 2x10 mild fatigue noted. Cuing for rhythm  Heel /row raise x20 with minimal UE hold     8/30 Nu step 5 min L6 UE & LE Bilateral er yellow 2x10  Horizontal abduction 2x10 yellow  Shoulder flexion yellow 2x10 yellow   Bilateral hip abduction red 2x10  Seated march 2x10   Narrow base of support eo and ec 3x30 sec hold  Tandem stance 2x30 each leg forward  Rhythmic stepping 2x10 mild fatigue noted. Cuing for rhythm   8/24  Nu step 5 min L6 UE & LE LAQ Sit<>stand with hands on rollator Standing heel raises 3 minute walk- distance 330' x3 with seated rest breaks in between Seated hamstring stretch   PATIENT EDUCATION:  Education details: exam findings, POC, HEP, education on general process of water therapy/transition to land  Person educated: Patient and Child(ren) Education method: Explanation Education comprehension: verbalized understanding and needs  further education   HOME EXERCISE PROGRAM: Try to walk regularly [id number]  ASSESSMENT:  CLINICAL IMPRESSION: The patient continues to tolerate exercises well. He reports he was fatigued after his exercises, but it was tolerable. He did not have nay pain. We continue to focus on strength and balance. We reviewed a supine exercises today. He tolerated well. We will continue to progress as tolerated.   Pt requires presence of therapist in pool at all times due to decreased balance and decreased confidence in water.    OBJECTIVE IMPAIRMENTS Abnormal gait, cardiopulmonary status limiting activity, decreased activity tolerance, decreased balance, decreased coordination, decreased endurance, decreased knowledge of use of DME, decreased mobility, difficulty walking, decreased strength, decreased safety awareness, increased fascial restrictions, postural dysfunction, and obesity.   ACTIVITY LIMITATIONS carrying, lifting, bending, standing, squatting, stairs, transfers, bed mobility, bathing, locomotion level, and caring for others  PARTICIPATION LIMITATIONS: cleaning, shopping, and community activity  PERSONAL FACTORS Age, Behavior pattern, Education, Fitness, Social background, and Time since onset of injury/illness/exacerbation are also affecting patient's functional outcome.   REHAB POTENTIAL: Good  CLINICAL DECISION MAKING: Stable/uncomplicated  EVALUATION COMPLEXITY: Low   GOALS: Goals reviewed with patient? Yes  SHORT TERM GOALS: Target date: 06/29/2022  Will be compliant with appropriate HEP  Baseline: Goal status: INITIAL  2.  Will be able to name 3 ways to reduce fall risk at home and in the community  Baseline:  Goal status: INITIAL  3.  Will be able to complete 5x sit to stand test in no more than 20 seconds  Baseline:  Goal status: INITIAL  4.  Will be able to complete all functional transfers safely with good hand placement, general sequencing, and safety awareness   Baseline:  Goal status: INITIAL   LONG TERM GOALS: Target date: 07/27/2022   MMT to improve by at least one grade in all weak groups  Baseline:  Goal status: INITIAL  2.  Will score at least 48 on the Berg to show reduced fall risk  Baseline:  Goal status: INITIAL  3.  Will be able to ambulate at least 554f on 3MWT with LRAD to show improved functional activity tolerance  Baseline:  Goal status: INITIAL  4.  Will be compliant with regular progressive walking program at home  Baseline:  Goal status: INITIAL  5.  Will be compliant with gym based vs advanced HEP at DC to maintain functional gains/prevent recurrence of deconditioning  Baseline:  Goal status: INITIAL    PLAN: PT FREQUENCY: 2x/week  PT DURATION: 8 weeks  PLANNED INTERVENTIONS: Therapeutic exercises, Therapeutic activity, Neuromuscular re-education, Balance training, Gait training, Patient/Family education, Joint mobilization, Stair training, DME instructions, Aquatic Therapy, Cognitive remediation, Cryotherapy, Moist heat, Taping, Ultrasound, Ionotophoresis '4mg'$ /ml Dexamethasone, Manual therapy, and Re-evaluation  PLAN FOR NEXT SESSION: monitor SOB, gastroc strength/ankle ROM  Carolyne Littles PT DPT  06/01/22 10:52 AM

## 2022-06-05 ENCOUNTER — Encounter: Payer: Self-pay | Admitting: Internal Medicine

## 2022-06-06 ENCOUNTER — Other Ambulatory Visit (HOSPITAL_BASED_OUTPATIENT_CLINIC_OR_DEPARTMENT_OTHER): Payer: Self-pay

## 2022-06-06 ENCOUNTER — Encounter (HOSPITAL_BASED_OUTPATIENT_CLINIC_OR_DEPARTMENT_OTHER): Payer: Self-pay | Admitting: Physical Therapy

## 2022-06-06 ENCOUNTER — Ambulatory Visit (HOSPITAL_BASED_OUTPATIENT_CLINIC_OR_DEPARTMENT_OTHER): Payer: Medicare Other | Attending: Cardiology | Admitting: Physical Therapy

## 2022-06-06 DIAGNOSIS — R2689 Other abnormalities of gait and mobility: Secondary | ICD-10-CM | POA: Diagnosis not present

## 2022-06-06 DIAGNOSIS — R2681 Unsteadiness on feet: Secondary | ICD-10-CM | POA: Insufficient documentation

## 2022-06-06 DIAGNOSIS — M6281 Muscle weakness (generalized): Secondary | ICD-10-CM | POA: Diagnosis not present

## 2022-06-06 NOTE — Therapy (Signed)
OUTPATIENT PHYSICAL THERAPY LOWER EXTREMITY TREATMENT NOTE Patient Name: Glen [name] MRN: [id number] DOB:05/27/1928, 88 y.o., male Today's Date: 05/30/2022     PT End of Session - 06/06/22 1023     Visit Number 14    Number of Visits 26    Date for PT Re-Evaluation 07/18/22    Authorization Type progress note on visit 65    Authorization Time Period 04/05/22 to 05/31/22    PT Start Time 1019    PT Stop Time 1100    PT Time Calculation (min) 41 min    Activity Tolerance Patient tolerated treatment well    Behavior During Therapy [facility] for tasks assessed/performed                Past Medical History:  Diagnosis Date   Atrial fibrillation ([place])    Diverticulitis    Esophageal dysmotility    Family history of diabetes insipidus    FHx: colon cancer    H. pylori infection    Hiatal hernia    History of colonic polyps    Hyperlipidemia    Hypertension    IDA (iron deficiency anemia)    Neoplasm of prostate    special screening malignant    Obesity    Palpitation    Peptic ulcer disease    Presbyesophagus    Schatzki's ring    Past Surgical History:  Procedure Laterality Date   2-D Echocardiogram  November 2009, 2011   Anal Fistula Repair     BALLOON DILATION N/A 01/17/2022   Procedure: Stacie Acres;  Surgeon: Jackquline Denmark, MD;  Location: [facility] ENDOSCOPY;  Service: Gastroenterology;  Laterality: N/A;   BIOPSY  07/14/2020   Procedure: BIOPSY;  Surgeon: [name], MD;  Location: [facility] ENDOSCOPY;  Service: Gastroenterology;;   BIOPSY  01/17/2022   Procedure: BIOPSY;  Surgeon: Jackquline Denmark, MD;  Location: [facility] ENDOSCOPY;  Service: Gastroenterology;;   CARDIAC VALVE SURGERY  1996   status post balloon valvuloplasty at [place]  2004   COLONOSCOPY  December 2005   ESOPHAGOGASTRODUODENOSCOPY  07/14/2020   ESOPHAGOGASTRODUODENOSCOPY N/A 01/17/2022   Procedure: ESOPHAGOGASTRODUODENOSCOPY (EGD);  Surgeon: Jackquline Denmark, MD;  Location: [facility]OS[facility];   Service: Gastroenterology;  Laterality: N/A;   ESOPHAGOGASTRODUODENOSCOPY (EGD) WITH PROPOFOL N/A 07/14/2020   Procedure: ESOPHAGOGASTRODUODENOSCOPY (EGD) WITH PROPOFOL;  Surgeon: [name], MD;  Location: [facility] ENDOSCOPY;  Service: Gastroenterology;  Laterality: N/A;   ETT  1991   Negative   HEMOSTASIS CLIP PLACEMENT  07/14/2020   HEMOSTASIS CLIP PLACEMENT   HEMOSTASIS CLIP PLACEMENT  07/14/2020   Procedure: HEMOSTASIS CLIP PLACEMENT;  Surgeon: [name], MD;  Location: [facility] ENDOSCOPY;  Service: Gastroenterology;;   KNEE ARTHROSCOPY     Right   TONSILLECTOMY     Patient Active Problem List   Diagnosis Date Noted   Physical deconditioning 01/16/2022   Dehydration 01/16/2022   Peptic ulcer disease    Fall 07/13/2020   Osteoarthritis 06/25/2019   Onychomycosis 07/29/2018   Esophageal dysphagia 07/29/2018   Peripheral edema 11/18/2015   Impaired glucose tolerance 04/19/2015   Permanent atrial fibrillation ([place]) 11/07/2010   Essential hypertension 01/20/2009   Atrial fibrillation, chronic ([place]) 03/13/2008   Dyslipidemia 07/31/2007   History of colonic polyps 07/31/2007    PCP: Dimas Chyle   REFERRING PROVIDER: Buford Dresser, MD   REFERRING DIAG: R53.81 (ICD-10-CM) - Physical deconditioning   THERAPY DIAG:  Muscle weakness (generalized)  Unsteadiness on feet  Other abnormalities of gait and mobility  Rationale for Evaluation and Treatment Rehabilitation  ONSET DATE: 02/02/2022   SUBJECTIVE:   SUBJECTIVE STATEMENT:  Patient reports he is feeling pretty good except for his knee.    PERTINENT HISTORY: PMH: HTN, Afib, OA, HLD, hernia, IDA, neoplasm of prostate, cardiac valve surgery, knee arthroscopy  Glen [name] is a 88 y.o. male with a hx of hypertension, hyperlidemia and permanent AF, previously on coumadin but now on no anticoagulation who is seen for a post hospital visit.    At his last appointment I reviewed his 07/2020 hospitalization. He was  seen for a 7 day transition of care visit. All medications reviewed, as well as review of hospital course and discharge summary. Patient's main concern was for over the counter medications for sinus congestion, allergies, pain, and sleep. He was told that he could not take anything until this was cleared by a doctor. We discussed this at length. Discussed absolute avoidance of NSAIDs. [place] for physical therapy, etc from a heart perspective. Reviewed 07/2020 hospitalization for GI bleed. ECG 07/14/20 afib RVR with PVC. Only cardiac medication at that visit was metoprolol, and heart rate was 95 bpm. I discussed changing to extended release metoprolol but he declined and would prefer to keep tartrate, as he said this was what Dr. Claiborne Billings had recommended to him. He was difficult to keep on task during the interview. He had chronic LE edema, which he endorsed is from the metoprolol. We discussed chronic venous insufficiency in depth. Could not feel his afib.     PAIN:  Only pain in my Rt knee when I move it. It is fine when it is still.    PRECAUTIONS: Fall  WEIGHT BEARING RESTRICTIONS No  FALLS:  Has patient fallen in last 6 months? No  LIVING ENVIRONMENT: Lives with: lives with their spouse but son is 10 minutes away  Lives in: House/apartment Stairs: Yes: Internal: 4 steps; can reach both does not have to go upstairs inside the home  Has following equipment at home: Gilford Rile - 2 wheeled and [employer] - 4 wheeled, shower chair, grab bars   OCCUPATION: retired   PLOF: Independent, Independent with basic ADLs, Independent with household mobility with device, and Requires assistive device for independence  PATIENT GOALS build endurance    OBJECTIVE:  * findings taken at evaluation unless otherwise noted   COGNITION:  Overall cognitive status:  very tangential and hard to keep on track      POSTURE: rounded shoulders, forward head, decreased lumbar lordosis, increased thoracic kyphosis, and flexed  trunk   LOWER EXTREMITY MMT:  MMT Right eval Left eval  Hip flexion 3+ 3+  Hip extension    Hip abduction 3 (sitting) 3 (sitting)  Hip adduction    Hip internal rotation    Hip external rotation    Knee flexion 4 4  Knee extension 4+ 4+  Ankle dorsiflexion 4- 4-  Ankle plantarflexion    Ankle inversion    Ankle eversion     (Blank rows = not tested)   FUNCTIONAL TESTS:  5 times sit to stand: 32 seconds had to use BUEs, very poor eccentric control and often "plopped" onto the table  3 minute walk test: 328f rollator no rest breaks  Tandem stance: unable to hold longer than 9 seconds without BUE support or assist from PT   GAIT: Distance walked: 3258fAssistive device utilized: WaEnvironmental consultant 4 wheeled Level of assistance: SBA Comments: flexed trunk, tends to push rollator a bit far ahead of  himself, slow pace, really easily fatigued   BERG: 25   TODAY'S TREATMENT: 9/5 Nu-step 6 min L3   Seated bicpes curl 2x15 2lbs  Seated press ups 2x15 1lb  Seated shoulder flexion 2x10 1lb  Shoulder scaption 2x10 1 lb    Heel /row raise x20 with minimal UE hold  Narrow base of support eo and ec 3x30 sec hold  Tandem stance 2x30 each leg forward   Step up 2x10 4 inch each leg    8/31  LTR x20  Supine march 2x10  Supine SLR 2x10 each leg  Supine clam shell 2x15 green   Nu-step 5 min L3     Narrow base of support eo and ec 3x30 sec hold  Tandem stance 2x30 each leg forward  Rhythmic stepping 2x10 mild fatigue noted. Cuing for rhythm  Heel /row raise x20 with minimal UE hold     8/30 Nu step 5 min L6 UE & LE Bilateral er yellow 2x10  Horizontal abduction 2x10 yellow  Shoulder flexion yellow 2x10 yellow   Bilateral hip abduction red 2x10  Seated march 2x10   Narrow base of support eo and ec 3x30 sec hold  Tandem stance 2x30 each leg forward  Rhythmic stepping 2x10 mild fatigue noted. Cuing for rhythm   8/24  Nu step 5 min L6 UE & LE LAQ Sit<>stand with  hands on rollator Standing heel raises 3 minute walk- distance 330' x3 with seated rest breaks in between Seated hamstring stretch   PATIENT EDUCATION:  Education details: exam findings, POC, HEP, education on general process of water therapy/transition to land  Person educated: Patient and Child(ren) Education method: Explanation Education comprehension: verbalized understanding and needs further education   HOME EXERCISE PROGRAM: Try to walk regularly [id number]  ASSESSMENT:  CLINICAL IMPRESSION: Therapy added low level stair training today. He did well. He perfromed light weight UE exercises as well. He tolerated well. He reported mild fatigue after. He has decided to forgo the group swimming classes. He feels like he can get just as much out of working and walking at home. We also talked to him about the gym. He feels like he is unsure if he will be able to get over here and wants to stay with things he can do at home. We reviewed an upper body series today that he can do with cans or handweights. We will continue to advance exercises. Overall if he takes what he has and keeps walking he will do well. He is likley nearing his max potential for PT especially if he does not plan on doing gym exercises..  Pt requires presence of therapist in pool at all times due to decreased balance and decreased confidence in water.    OBJECTIVE IMPAIRMENTS Abnormal gait, cardiopulmonary status limiting activity, decreased activity tolerance, decreased balance, decreased coordination, decreased endurance, decreased knowledge of use of DME, decreased mobility, difficulty walking, decreased strength, decreased safety awareness, increased fascial restrictions, postural dysfunction, and obesity.   ACTIVITY LIMITATIONS carrying, lifting, bending, standing, squatting, stairs, transfers, bed mobility, bathing, locomotion level, and caring for others  PARTICIPATION LIMITATIONS: cleaning, shopping, and community  activity  PERSONAL FACTORS Age, Behavior pattern, Education, Fitness, Social background, and Time since onset of injury/illness/exacerbation are also affecting patient's functional outcome.   REHAB POTENTIAL: Good  CLINICAL DECISION MAKING: Stable/uncomplicated  EVALUATION COMPLEXITY: Low   GOALS: Goals reviewed with patient? Yes  SHORT TERM GOALS: Target date: 07/04/2022  Will be compliant with appropriate HEP  Baseline: Goal status:  INITIAL  2.  Will be able to name 3 ways to reduce fall risk at home and in the community  Baseline:  Goal status: INITIAL  3.  Will be able to complete 5x sit to stand test in no more than 20 seconds  Baseline:  Goal status: INITIAL  4.  Will be able to complete all functional transfers safely with good hand placement, general sequencing, and safety awareness  Baseline:  Goal status: INITIAL   LONG TERM GOALS: Target date: 08/01/2022   MMT to improve by at least one grade in all weak groups  Baseline:  Goal status: INITIAL  2.  Will score at least 48 on the Berg to show reduced fall risk  Baseline:  Goal status: INITIAL  3.  Will be able to ambulate at least 557f on 3MWT with LRAD to show improved functional activity tolerance  Baseline:  Goal status: INITIAL  4.  Will be compliant with regular progressive walking program at home  Baseline:  Goal status: INITIAL  5.  Will be compliant with gym based vs advanced HEP at DC to maintain functional gains/prevent recurrence of deconditioning  Baseline:  Goal status: INITIAL    PLAN: PT FREQUENCY: 2x/week  PT DURATION: 8 weeks  PLANNED INTERVENTIONS: Therapeutic exercises, Therapeutic activity, Neuromuscular re-education, Balance training, Gait training, Patient/Family education, Joint mobilization, Stair training, DME instructions, Aquatic Therapy, Cognitive remediation, Cryotherapy, Moist heat, Taping, Ultrasound, Ionotophoresis '4mg'$ /ml Dexamethasone, Manual therapy, and  Re-evaluation  PLAN FOR NEXT SESSION: monitor SOB, gastroc strength/ankle ROM  DCarolyne LittlesPT DPT  06/06/22 10:26 AM

## 2022-06-08 ENCOUNTER — Ambulatory Visit (HOSPITAL_BASED_OUTPATIENT_CLINIC_OR_DEPARTMENT_OTHER): Payer: Medicare Other | Admitting: Physical Therapy

## 2022-06-08 ENCOUNTER — Other Ambulatory Visit (HOSPITAL_BASED_OUTPATIENT_CLINIC_OR_DEPARTMENT_OTHER): Payer: Self-pay

## 2022-06-08 ENCOUNTER — Encounter (HOSPITAL_BASED_OUTPATIENT_CLINIC_OR_DEPARTMENT_OTHER): Payer: Self-pay | Admitting: Physical Therapy

## 2022-06-08 DIAGNOSIS — M6281 Muscle weakness (generalized): Secondary | ICD-10-CM | POA: Diagnosis not present

## 2022-06-08 DIAGNOSIS — R2689 Other abnormalities of gait and mobility: Secondary | ICD-10-CM | POA: Diagnosis not present

## 2022-06-08 DIAGNOSIS — R2681 Unsteadiness on feet: Secondary | ICD-10-CM

## 2022-06-08 NOTE — Therapy (Signed)
OUTPATIENT PHYSICAL THERAPY LOWER EXTREMITY TREATMENT NOTE Patient Name: Glen [name] MRN: [id number] DOB:[birth year], 88 y.o., male Today's Date: 05/30/2022     PT End of Session - 06/08/22 1214     Visit Number 15    Number of Visits 26    Date for PT Re-Evaluation 07/18/22    PT Start Time 5809    PT Stop Time 1058    PT Time Calculation (min) 43 min    Activity Tolerance Patient tolerated treatment well    Behavior During Therapy [facility] for tasks assessed/performed                 Past Medical History:  Diagnosis Date   Atrial fibrillation ([place])    Diverticulitis    Esophageal dysmotility    Family history of diabetes insipidus    FHx: colon cancer    H. pylori infection    Hiatal hernia    History of colonic polyps    Hyperlipidemia    Hypertension    IDA (iron deficiency anemia)    Neoplasm of prostate    special screening malignant    Obesity    Palpitation    Peptic ulcer disease    Presbyesophagus    Schatzki's ring    Past Surgical History:  Procedure Laterality Date   2-D Echocardiogram  November 2009, 2011   Anal Fistula Repair     BALLOON DILATION N/A 01/17/2022   Procedure: Stacie Acres;  Surgeon: Jackquline Denmark, MD;  Location: [facility] END[facility]COPY;  Service: Gastroenterology;  Laterality: N/A;   BIOPSY  07/14/2020   Procedure: BIOPSY;  Surgeon: [name], MD;  Location: [facility] ENDOSCOPY;  Service: Gastroenterology;;   BIOPSY  01/17/2022   Procedure: BIOPSY;  Surgeon: Jackquline Denmark, MD;  Location: [facility] ENDOSCOPY;  Service: Gastroenterology;;   CARDIAC VALVE SURGERY  1996   status post balloon valvuloplasty at [place]  2004   COLONOSCOPY  December 2005   ESOPHAGOGASTRODUODENOSCOPY  07/14/2020   ESOPHAGOGASTRODUODENOSCOPY N/A 01/17/2022   Procedure: ESOPHAGOGASTRODUODENOSCOPY (EGD);  Surgeon: Jackquline Denmark, MD;  Location: [facility] END[facility];  Service: Gastroenterology;  Laterality: N/A;   ESOPHAGOGASTRODUODENOSCOPY (EGD) WITH PROPOFOL N/A  07/14/2020   Procedure: ESOPHAGOGASTRODUODENOSCOPY (EGD) WITH PROPOFOL;  Surgeon: [name], MD;  [facility] ENDOSCOPY;  Service: Gastroenterology;  Laterality: N/A;   ETT  1991   Negative   HEMOSTASIS CLIP PLACEMENT  07/14/2020   HEMOSTASIS CLIP PLACEMENT   HEMOSTASIS CLIP PLACEMENT  07/14/2020   Procedure: HEMOSTASIS CLIP PLACEMENT;  Surgeon: [name], MD;  Location: [facility] ENDOSCOPY;  Service: Gastroenterology;;   KNEE ARTHROSCOPY     Right   TONSILLECTOMY     Patient Active Problem List   Diagnosis Date Noted   Physical deconditioning 01/16/2022   Dehydration 01/16/2022   Peptic ulcer disease    Fall 07/13/2020   Osteoarthritis 06/25/2019   Onychomycosis 07/29/2018   Esophageal dysphagia 07/29/2018   Peripheral edema 11/18/2015   Impaired glucose tolerance 04/19/2015   Permanent atrial fibrillation ([place]) 11/07/2010   Essential hypertension 01/20/2009   Atrial fibrillation, chronic ([place]) 03/13/2008   Dyslipidemia 07/31/2007   History of colonic polyps 07/31/2007    PCP: Dimas Chyle   REFERRING PROVIDER: Buford Dresser, MD   REFERRING DIAG: R53.81 (ICD-10-CM) - Physical deconditioning   THERAPY DIAG:  Muscle weakness (generalized)  Unsteadiness on feet  Other abnormalities of gait and mobility  Rationale for Evaluation and Treatment Rehabilitation  ONSET DATE: 02/02/2022   SUBJECTIVE:   SUBJECTIVE STATEMENT:  Patient reports he is feeling pretty good except for his knee.    PERTINENT HISTORY: PMH: HTN, Afib, OA, HLD, hernia, IDA, neoplasm of prostate, cardiac valve surgery, knee arthroscopy  Glen [name] is a 88 y.o. male with a hx of hypertension, hyperlidemia and permanent AF, previously on coumadin but now on no anticoagulation who is seen for a post hospital visit.    At his last appointment I reviewed his 07/2020 hospitalization. He was seen for a 7 day transition of care visit. All medications reviewed, as well as review of hospital  course and discharge summary. Patient's main concern was for over the counter medications for sinus congestion, allergies, pain, and sleep. He was told that he could not take anything until this was cleared by a doctor. We discussed this at length. Discussed absolute avoidance of NSAIDs. [place] for physical therapy, etc from a heart perspective. Reviewed 07/2020 hospitalization for GI bleed. ECG 07/14/20 afib RVR with PVC. Only cardiac medication at that visit was metoprolol, and heart rate was 95 bpm. I discussed changing to extended release metoprolol but he declined and would prefer to keep tartrate, as he said this was what Dr. Claiborne Billings had recommended to him. He was difficult to keep on task during the interview. He had chronic LE edema, which he endorsed is from the metoprolol. We discussed chronic venous insufficiency in depth. Could not feel his afib.     PAIN:  Only pain in my Rt knee when I move it. It is fine when it is still.    PRECAUTIONS: Fall  WEIGHT BEARING RESTRICTIONS No  FALLS:  Has patient fallen in last 6 months? No  LIVING ENVIRONMENT: Lives with: lives with their spouse but son is 10 minutes away  Lives in: House/apartment Stairs: Yes: Internal: 4 steps; can reach both does not have to go upstairs inside the home  Has following equipment at home: Gilford Rile - 2 wheeled and [employer] - 4 wheeled, shower chair, grab bars   OCCUPATION: retired   PLOF: Independent, Independent with basic ADLs, Independent with household mobility with device, and Requires assistive device for independence  PATIENT GOALS build endurance    OBJECTIVE:  * findings taken at evaluation unless otherwise noted   COGNITION:  Overall cognitive status:  very tangential and hard to keep on track      POSTURE: rounded shoulders, forward head, decreased lumbar lordosis, increased thoracic kyphosis, and flexed trunk   LOWER EXTREMITY MMT:  MMT Right eval Left eval  Hip flexion 3+ 3+  Hip extension     Hip abduction 3 (sitting) 3 (sitting)  Hip adduction    Hip internal rotation    Hip external rotation    Knee flexion 4 4  Knee extension 4+ 4+  Ankle dorsiflexion 4- 4-  Ankle plantarflexion    Ankle inversion    Ankle eversion     (Blank rows = not tested)   FUNCTIONAL TESTS:  5 times sit to stand: 32 seconds had to use BUEs, very poor eccentric control and often "plopped" onto the table  3 minute walk test: 324f rollator no rest breaks  Tandem stance: unable to hold longer than 9 seconds without BUE support or assist from PT   GAIT: Distance walked: 3252fAssistive device utilized: WaEnvironmental consultant 4 wheeled Level of assistance: SBA Comments: flexed trunk, tends to push rollator a bit far ahead of himself, slow pace, really easily fatigued   BERG: 25   TODAY'S TREATMENT: 9/7 Nu-step 6 min  L3   Seated Hip abduction 20 green  Seated March x20 Green  Seated LAQ x20   Bilateral ER x20 green  Horizontal abduction x20 green   Air-ex normal base 2x30 sec hold  Air-ex narrow base 230 sec hold with min a  Step onto air-ex x15 each leg with min UE hold   9/5 Nu-step 6 min L3   Seated bicpes curl 2x15 2lbs  Seated press ups 2x15 1lb  Seated shoulder flexion 2x10 1lb  Shoulder scaption 2x10 1 lb    Heel /row raise x20 with minimal UE hold  Narrow base of support eo and ec 3x30 sec hold  Tandem stance 2x30 each leg forward   Step up 2x10 4 inch each leg    8/31  LTR x20  Supine march 2x10  Supine SLR 2x10 each leg  Supine clam shell 2x15 green   Nu-step 5 min L3     Narrow base of support eo and ec 3x30 sec hold  Tandem stance 2x30 each leg forward  Rhythmic stepping 2x10 mild fatigue noted. Cuing for rhythm  Heel /row raise x20 with minimal UE hold       PATIENT EDUCATION:  Education details: exam findings, POC, HEP, education on general process of water therapy/transition to land  Person educated: Patient and Child(ren) Education method:  Explanation Education comprehension: verbalized understanding and needs further education   HOME EXERCISE PROGRAM: Try to walk regularly [id number]  ASSESSMENT:  CLINICAL IMPRESSION: Therapy added the air-ex to his balance program. He did well. He required some assist with narrow base and with stepping. He continues to work hard on his exercises as home. E has increased his walking distance in his yard. We advanced his band to a blue band. We will likley D/C next week as he is doing a full home program.    Pt requires presence of therapist in pool at all times due to decreased balance and decreased confidence in water.    OBJECTIVE IMPAIRMENTS Abnormal gait, cardiopulmonary status limiting activity, decreased activity tolerance, decreased balance, decreased coordination, decreased endurance, decreased knowledge of use of DME, decreased mobility, difficulty walking, decreased strength, decreased safety awareness, increased fascial restrictions, postural dysfunction, and obesity.   ACTIVITY LIMITATIONS carrying, lifting, bending, standing, squatting, stairs, transfers, bed mobility, bathing, locomotion level, and caring for others  PARTICIPATION LIMITATIONS: cleaning, shopping, and community activity  PERSONAL FACTORS Age, Behavior pattern, Education, Fitness, Social background, and Time since onset of injury/illness/exacerbation are also affecting patient's functional outcome.   REHAB POTENTIAL: Good  CLINICAL DECISION MAKING: Stable/uncomplicated  EVALUATION COMPLEXITY: Low   GOALS: Goals reviewed with patient? Yes  SHORT TERM GOALS: Target date: 07/06/2022  Will be compliant with appropriate HEP  Baseline: Goal status: INITIAL  2.  Will be able to name 3 ways to reduce fall risk at home and in the community  Baseline:  Goal status: INITIAL  3.  Will be able to complete 5x sit to stand test in no more than 20 seconds  Baseline:  Goal status: INITIAL  4.  Will be able to  complete all functional transfers safely with good hand placement, general sequencing, and safety awareness  Baseline:  Goal status: INITIAL   LONG TERM GOALS: Target date: 08/03/2022   MMT to improve by at least one grade in all weak groups  Baseline:  Goal status: INITIAL  2.  Will score at least 48 on the Berg to show reduced fall risk  Baseline:  Goal status: INITIAL  3.  Will be able to ambulate at least 532f on 3MWT with LRAD to show improved functional activity tolerance  Baseline:  Goal status: INITIAL  4.  Will be compliant with regular progressive walking program at home  Baseline:  Goal status: INITIAL  5.  Will be compliant with gym based vs advanced HEP at DC to maintain functional gains/prevent recurrence of deconditioning  Baseline:  Goal status: INITIAL    PLAN: PT FREQUENCY: 2x/week  PT DURATION: 8 weeks  PLANNED INTERVENTIONS: Therapeutic exercises, Therapeutic activity, Neuromuscular re-education, Balance training, Gait training, Patient/Family education, Joint mobilization, Stair training, DME instructions, Aquatic Therapy, Cognitive remediation, Cryotherapy, Moist heat, Taping, Ultrasound, Ionotophoresis '4mg'$ /ml Dexamethasone, Manual therapy, and Re-evaluation  PLAN FOR NEXT SESSION: monitor SOB, gastroc strength/ankle ROM  DCarolyne LittlesPT DPT  06/08/22 12:22 PM

## 2022-06-09 ENCOUNTER — Other Ambulatory Visit (HOSPITAL_BASED_OUTPATIENT_CLINIC_OR_DEPARTMENT_OTHER): Payer: Self-pay

## 2022-06-12 NOTE — Telephone Encounter (Signed)
Noted JMP

## 2022-06-13 ENCOUNTER — Encounter (HOSPITAL_BASED_OUTPATIENT_CLINIC_OR_DEPARTMENT_OTHER): Payer: Self-pay | Admitting: Physical Therapy

## 2022-06-13 ENCOUNTER — Ambulatory Visit (HOSPITAL_BASED_OUTPATIENT_CLINIC_OR_DEPARTMENT_OTHER): Payer: Medicare Other | Admitting: Physical Therapy

## 2022-06-13 DIAGNOSIS — M6281 Muscle weakness (generalized): Secondary | ICD-10-CM | POA: Diagnosis not present

## 2022-06-13 DIAGNOSIS — R2689 Other abnormalities of gait and mobility: Secondary | ICD-10-CM | POA: Diagnosis not present

## 2022-06-13 DIAGNOSIS — R2681 Unsteadiness on feet: Secondary | ICD-10-CM

## 2022-06-13 NOTE — Therapy (Signed)
OUTPATIENT PHYSICAL THERAPY LOWER EXTREMITY TREATMENT NOTE Patient Name: Glen [name] MRN: [id number] DOB:1927-12-03, 88 y.o., male Today's Date: 05/30/2022     PT End of Session - 06/13/22 1013     Visit Number 16    Number of Visits 26    Date for PT Re-Evaluation 07/18/22    Authorization Type progress note on visit 53    Authorization Time Period 04/05/22 to 05/31/22    PT Start Time 1010    PT Stop Time 1050    PT Time Calculation (min) 40 min    Activity Tolerance Patient tolerated treatment well    Behavior During Therapy [facility] for tasks assessed/performed                 Past Medical History:  Diagnosis Date   Atrial fibrillation ([place])    Diverticulitis    Esophageal dysmotility    Family history of diabetes insipidus    FHx: colon cancer    H. pylori infection    Hiatal hernia    History of colonic polyps    Hyperlipidemia    Hypertension    IDA (iron deficiency anemia)    Neoplasm of prostate    special screening malignant    Obesity    Palpitation    Peptic ulcer disease    Presbyesophagus    Schatzki's ring    Past Surgical History:  Procedure Laterality Date   2-D Echocardiogram  November 2009, 2011   Anal Fistula Repair     BALLOON DILATION N/A 01/17/2022   Procedure: Stacie Acres;  Surgeon: Jackquline Denmark, MD;  Location: [facility];  Service: Gastroenterology;  Laterality: N/A;   BIOPSY  07/14/2020   Procedure: BIOPSY;  Surgeon: [name], MD;  Location: [facility] ENDOSCOPY;  Service: Gastroenterology;;   BIOPSY  01/17/2022   Procedure: BIOPSY;  Surgeon: Jackquline Denmark, MD;  Location: [facility] ENDOSCOPY;  Service: Gastroenterology;;   CARDIAC VALVE SURGERY  1996   status post balloon valvuloplasty at [place]  2004   COLONOSCOPY  December 2005   ESOPHAGOGASTRODUODENOSCOPY  07/14/2020   ESOPHAGOGASTRODUODENOSCOPY N/A 01/17/2022   Procedure: ESOPHAGOGASTRODUODENOSCOPY (EGD);  Surgeon: Jackquline Denmark, MD;  Location: [facility];   Service: Gastroenterology;  Laterality: N/A;   ESOPHAGOGASTRODUODENOSCOPY (EGD) WITH PROPOFOL N/A 07/14/2020   Procedure: ESOPHAGOGASTRODUODENOSCOPY (EGD) WITH PROPOFOL;  Surgeon: [name], MD;  Location: [facility] ENDOSCOPY;  Service: Gastroenterology;  Laterality: N/A;   ETT  1991   Negative   HEMOSTASIS CLIP PLACEMENT  07/14/2020   HEMOSTASIS CLIP PLACEMENT   HEMOSTASIS CLIP PLACEMENT  07/14/2020   Procedure: HEMOSTASIS CLIP PLACEMENT;  Surgeon: [name], MD;  Location: [facility] ENDOSCOPY;  Service: Gastroenterology;;   KNEE ARTHROSCOPY     Right   TONSILLECTOMY     Patient Active Problem List   Diagnosis Date Noted   Physical deconditioning 01/16/2022   Dehydration 01/16/2022   Peptic ulcer disease    Fall 07/13/2020   Osteoarthritis 06/25/2019   Onychomycosis 07/29/2018   Esophageal dysphagia 07/29/2018   Peripheral edema 11/18/2015   Impaired glucose tolerance 04/19/2015   Permanent atrial fibrillation ([place]) 11/07/2010   Essential hypertension 01/20/2009   Atrial fibrillation, chronic ([place]) 03/13/2008   Dyslipidemia 07/31/2007   History of colonic polyps 07/31/2007    PCP: Dimas Chyle   REFERRING PROVIDER: Buford Dresser, MD   REFERRING DIAG: R53.81 (ICD-10-CM) - Physical deconditioning   THERAPY DIAG:  Muscle weakness (generalized)  Unsteadiness on feet  Other abnormalities of gait and  mobility  Rationale for Evaluation and Treatment Rehabilitation  ONSET DATE: 02/02/2022   SUBJECTIVE:   SUBJECTIVE STATEMENT:  Patient reports he has been exercising everyday. He has been feeling pretty good. He has had some trouble sleeping.    PERTINENT HISTORY: PMH: HTN, Afib, OA, HLD, hernia, IDA, neoplasm of prostate, cardiac valve surgery, knee arthroscopy  Glen [name] is a 88 y.o. male with a hx of hypertension, hyperlidemia and permanent AF, previously on coumadin but now on no anticoagulation who is seen for a post hospital visit.    At his last  appointment I reviewed his 07/2020 hospitalization. He was seen for a 7 day transition of care visit. All medications reviewed, as well as review of hospital course and discharge summary. Patient's main concern was for over the counter medications for sinus congestion, allergies, pain, and sleep. He was told that he could not take anything until this was cleared by a doctor. We discussed this at length. Discussed absolute avoidance of NSAIDs. [place] for physical therapy, etc from a heart perspective. Reviewed 07/2020 hospitalization for GI bleed. ECG 07/14/20 afib RVR with PVC. Only cardiac medication at that visit was metoprolol, and heart rate was 95 bpm. I discussed changing to extended release metoprolol but he declined and would prefer to keep tartrate, as he said this was what Dr. Claiborne Billings had recommended to him. He was difficult to keep on task during the interview. He had chronic LE edema, which he endorsed is from the metoprolol. We discussed chronic venous insufficiency in depth. Could not feel his afib.     PAIN:  Only pain in my Rt knee when I move it. It is fine when it is still.    PRECAUTIONS: Fall  WEIGHT BEARING RESTRICTIONS No  FALLS:  Has patient fallen in last 6 months? No  LIVING ENVIRONMENT: Lives with: lives with their spouse but son is 10 minutes away  Lives in: House/apartment Stairs: Yes: Internal: 4 steps; can reach both does not have to go upstairs inside the home  Has following equipment at home: Gilford Rile - 2 wheeled and [employer] - 4 wheeled, shower chair, grab bars   OCCUPATION: retired   PLOF: Independent, Independent with basic ADLs, Independent with household mobility with device, and Requires assistive device for independence  PATIENT GOALS build endurance    OBJECTIVE:  * findings taken at evaluation unless otherwise noted   COGNITION:  Overall cognitive status:  very tangential and hard to keep on track      POSTURE: rounded shoulders, forward head, decreased  lumbar lordosis, increased thoracic kyphosis, and flexed trunk   LOWER EXTREMITY MMT:  MMT Right eval Left eval  Hip flexion 3+ 3+  Hip extension    Hip abduction 3 (sitting) 3 (sitting)  Hip adduction    Hip internal rotation    Hip external rotation    Knee flexion 4 4  Knee extension 4+ 4+  Ankle dorsiflexion 4- 4-  Ankle plantarflexion    Ankle inversion    Ankle eversion     (Blank rows = not tested)   FUNCTIONAL TESTS:  5 times sit to stand: 32 seconds had to use BUEs, very poor eccentric control and often "plopped" onto the table  3 minute walk test: 349f rollator no rest breaks  Tandem stance: unable to hold longer than 9 seconds without BUE support or assist from PT   GAIT: Distance walked: 3247fAssistive device utilized: Walker - 4 wheeled Level of assistance: SBA Comments: flexed  trunk, tends to push rollator a bit far ahead of himself, slow pace, really easily fatigued   BERG: 25   TODAY'S TREATMENT: 9/11 All gym exercise performed with education ion proper fitting and safety considerations    Leg press lifestyles 25# 3x15  Hip abduction Machine 25 lbs 3x15  Row machine 3x15 10 lbs  Triceps press down 3x15 10 lbs   Air-ex normal base 2x30 sec hold  Air-ex narrow base 230 sec hold with min a  Step onto air-ex x15 each leg with min UE hold     9/7 Nu-step 6 min L3   Seated Hip abduction 20 green  Seated March x20 Green  Seated LAQ x20   Bilateral ER x20 green  Horizontal abduction x20 green   Air-ex normal base 2x30 sec hold  Air-ex narrow base 230 sec hold with min a  Step onto air-ex x15 each leg with min UE hold   9/5 Nu-step 6 min L3   Seated bicpes curl 2x15 2lbs  Seated press ups 2x15 1lb  Seated shoulder flexion 2x10 1lb  Shoulder scaption 2x10 1 lb    Heel /row raise x20 with minimal UE hold  Narrow base of support eo and ec 3x30 sec hold  Tandem stance 2x30 each leg forward   Step up 2x10 4 inch each leg     PATIENT  EDUCATION:  Education details: exam findings, POC, HEP, education on general process of water therapy/transition to land  Person educated: Patient and Child(ren) Education method: Explanation Education comprehension: verbalized understanding and needs further education   HOME EXERCISE PROGRAM: Try to walk regularly [id number]  ASSESSMENT:  CLINICAL IMPRESSION: The patient is making good progress. He is not sure if he wants to come to the gym or not. We brought him through some machines today. He had some safety issues getting on and off. He was advised at first he should work with our health and wellness specialist or with his family members. He reported mild fatigue but otherwise did well. He remains unsure if he will come to the gym or not.   Pt requires presence of therapist in pool at all times due to decreased balance and decreased confidence in water.    OBJECTIVE IMPAIRMENTS Abnormal gait, cardiopulmonary status limiting activity, decreased activity tolerance, decreased balance, decreased coordination, decreased endurance, decreased knowledge of use of DME, decreased mobility, difficulty walking, decreased strength, decreased safety awareness, increased fascial restrictions, postural dysfunction, and obesity.   ACTIVITY LIMITATIONS carrying, lifting, bending, standing, squatting, stairs, transfers, bed mobility, bathing, locomotion level, and caring for others  PARTICIPATION LIMITATIONS: cleaning, shopping, and community activity  PERSONAL FACTORS Age, Behavior pattern, Education, Fitness, Social background, and Time since onset of injury/illness/exacerbation are also affecting patient's functional outcome.   REHAB POTENTIAL: Good  CLINICAL DECISION MAKING: Stable/uncomplicated  EVALUATION COMPLEXITY: Low   GOALS: Goals reviewed with patient? Yes  SHORT TERM GOALS: Target date: 07/11/2022  Will be compliant with appropriate HEP  Baseline: Goal status: INITIAL  2.  Will  be able to name 3 ways to reduce fall risk at home and in the community  Baseline:  Goal status: INITIAL  3.  Will be able to complete 5x sit to stand test in no more than 20 seconds  Baseline:  Goal status: INITIAL  4.  Will be able to complete all functional transfers safely with good hand placement, general sequencing, and safety awareness  Baseline:  Goal status: INITIAL   LONG TERM GOALS: Target date: 08/08/2022  MMT to improve by at least one grade in all weak groups  Baseline:  Goal status: INITIAL  2.  Will score at least 48 on the Berg to show reduced fall risk  Baseline:  Goal status: INITIAL  3.  Will be able to ambulate at least 542f on 3MWT with LRAD to show improved functional activity tolerance  Baseline:  Goal status: INITIAL  4.  Will be compliant with regular progressive walking program at home  Baseline:  Goal status: INITIAL  5.  Will be compliant with gym based vs advanced HEP at DC to maintain functional gains/prevent recurrence of deconditioning  Baseline:  Goal status: INITIAL    PLAN: PT FREQUENCY: 2x/week  PT DURATION: 8 weeks  PLANNED INTERVENTIONS: Therapeutic exercises, Therapeutic activity, Neuromuscular re-education, Balance training, Gait training, Patient/Family education, Joint mobilization, Stair training, DME instructions, Aquatic Therapy, Cognitive remediation, Cryotherapy, Moist heat, Taping, Ultrasound, Ionotophoresis '4mg'$ /ml Dexamethasone, Manual therapy, and Re-evaluation  PLAN FOR NEXT SESSION: monitor SOB, gastroc strength/ankle ROM  DCarolyne LittlesPT DPT  06/13/22 10:25 AM

## 2022-06-15 ENCOUNTER — Ambulatory Visit (HOSPITAL_BASED_OUTPATIENT_CLINIC_OR_DEPARTMENT_OTHER): Payer: Medicare Other | Admitting: Physical Therapy

## 2022-06-15 ENCOUNTER — Encounter (HOSPITAL_BASED_OUTPATIENT_CLINIC_OR_DEPARTMENT_OTHER): Payer: Self-pay | Admitting: Physical Therapy

## 2022-06-15 DIAGNOSIS — R2681 Unsteadiness on feet: Secondary | ICD-10-CM

## 2022-06-15 DIAGNOSIS — R2689 Other abnormalities of gait and mobility: Secondary | ICD-10-CM

## 2022-06-15 DIAGNOSIS — M6281 Muscle weakness (generalized): Secondary | ICD-10-CM

## 2022-06-15 NOTE — Therapy (Signed)
OUTPATIENT PHYSICAL THERAPY LOWER EXTREMITY TREATMENT NOTE/Discharge  Patient Name: Glen [name] MRN: [id number] DOB:September 03, 1928, 88 y.o., male Today's Date: 05/30/2022     PT End of Session - 06/15/22 1014     Visit Number 17    Number of Visits 26    Date for PT Re-Evaluation 07/18/22    Authorization Type progress note on visit 60    PT Start Time 1010    PT Stop Time 1051    PT Time Calculation (min) 41 min    Activity Tolerance Patient tolerated treatment well    Behavior During Therapy [facility] for tasks assessed/performed                 Past Medical History:  Diagnosis Date   Atrial fibrillation ([place])    Diverticulitis    Esophageal dysmotility    Family history of diabetes insipidus    FHx: colon cancer    H. pylori infection    Hiatal hernia    History of colonic polyps    Hyperlipidemia    Hypertension    IDA (iron deficiency anemia)    Neoplasm of prostate    special screening malignant    Obesity    Palpitation    Peptic ulcer disease    Presbyesophagus    Schatzki's ring    Past Surgical History:  Procedure Laterality Date   2-D Echocardiogram  November 2009, 2011   Anal Fistula Repair     BALLOON DILATION N/A 01/17/2022   Procedure: Stacie Acres;  Surgeon: Jackquline Denmark, MD;  Location: [facility];  Service: Gastroenterology;  Laterality: N/A;   BIOPSY  07/14/2020   Procedure: BIOPSY;  Surgeon: [name], MD;  Location: [facility] ENDOSCOPY;  Service: Gastroenterology;;   BIOPSY  01/17/2022   Procedure: BIOPSY;  Surgeon: Jackquline Denmark, MD;  Location: [facility] ENDOSCOPY;  Service: Gastroenterology;;   CARDIAC VALVE SURGERY  1996   status post balloon valvuloplasty at [place]  2004   COLONOSCOPY  December 2005   ESOPHAGOGASTRODUODENOSCOPY  07/14/2020   ESOPHAGOGASTRODUODENOSCOPY N/A 01/17/2022   Procedure: ESOPHAGOGASTRODUODENOSCOPY (EGD);  Surgeon: Jackquline Denmark, MD;  Location: [facility];  Service: Gastroenterology;   Laterality: N/A;   ESOPHAGOGASTRODUODENOSCOPY (EGD) WITH PROPOFOL N/A 07/14/2020   Procedure: ESOPHAGOGASTRODUODENOSCOPY (EGD) WITH PROPOFOL;  Surgeon: [name], MD;  Location: [facility] ENDOSCOPY;  Service: Gastroenterology;  Laterality: N/A;   ETT  1991   Negative   HEMOSTASIS CLIP PLACEMENT  07/14/2020   HEMOSTASIS CLIP PLACEMENT   HEMOSTASIS CLIP PLACEMENT  07/14/2020   Procedure: HEMOSTASIS CLIP PLACEMENT;  Surgeon: [name], MD;  Location: [facility] ENDOSCOPY;  Service: Gastroenterology;;   KNEE ARTHROSCOPY     Right   TONSILLECTOMY     Patient Active Problem List   Diagnosis Date Noted   Physical deconditioning 01/16/2022   Dehydration 01/16/2022   Peptic ulcer disease    Fall 07/13/2020   Osteoarthritis 06/25/2019   Onychomycosis 07/29/2018   Esophageal dysphagia 07/29/2018   Peripheral edema 11/18/2015   Impaired glucose tolerance 04/19/2015   Permanent atrial fibrillation ([place]) 11/07/2010   Essential hypertension 01/20/2009   Atrial fibrillation, chronic ([place]) 03/13/2008   Dyslipidemia 07/31/2007   History of colonic polyps 07/31/2007    PCP: Dimas Chyle   REFERRING PROVIDER: Buford Dresser, MD   REFERRING DIAG: R53.81 (ICD-10-CM) - Physical deconditioning   THERAPY DIAG:  No diagnosis found.  Rationale for Evaluation and Treatment Rehabilitation  ONSET DATE: 02/02/2022   SUBJECTIVE:   SUBJECTIVE STATEMENT:  Patient repots he was a little sore after the gym exercises. He has decieded he will focus more on the home exercises.    PERTINENT HISTORY: PMH: HTN, Afib, OA, HLD, hernia, IDA, neoplasm of prostate, cardiac valve surgery, knee arthroscopy  Glen [name] is a 88 y.o. male with a hx of hypertension, hyperlidemia and permanent AF, previously on coumadin but now on no anticoagulation who is seen for a post hospital visit.    At his last appointment I reviewed his 07/2020 hospitalization. He was seen for a 7 day transition of care visit. All  medications reviewed, as well as review of hospital course and discharge summary. Patient's main concern was for over the counter medications for sinus congestion, allergies, pain, and sleep. He was told that he could not take anything until this was cleared by a doctor. We discussed this at length. Discussed absolute avoidance of NSAIDs. [place] for physical therapy, etc from a heart perspective. Reviewed 07/2020 hospitalization for GI bleed. ECG 07/14/20 afib RVR with PVC. Only cardiac medication at that visit was metoprolol, and heart rate was 95 bpm. I discussed changing to extended release metoprolol but he declined and would prefer to keep tartrate, as he said this was what Dr. Claiborne Billings had recommended to him. He was difficult to keep on task during the interview. He had chronic LE edema, which he endorsed is from the metoprolol. We discussed chronic venous insufficiency in depth. Could not feel his afib.     PAIN:  Only pain in my Rt knee when I move it. It is fine when it is still.    PRECAUTIONS: Fall  WEIGHT BEARING RESTRICTIONS No  FALLS:  Has patient fallen in last 6 months? No  LIVING ENVIRONMENT: Lives with: lives with their spouse but son is 10 minutes away  Lives in: House/apartment Stairs: Yes: Internal: 4 steps; can reach both does not have to go upstairs inside the home  Has following equipment at home: Gilford Rile - 2 wheeled and [employer] - 4 wheeled, shower chair, grab bars   OCCUPATION: retired   PLOF: Independent, Independent with basic ADLs, Independent with household mobility with device, and Requires assistive device for independence  PATIENT GOALS build endurance    OBJECTIVE:  * findings taken at evaluation unless otherwise noted   COGNITION:  Overall cognitive status:  very tangential and hard to keep on track      POSTURE: rounded shoulders, forward head, decreased lumbar lordosis, increased thoracic kyphosis, and flexed trunk   LOWER EXTREMITY MMT:  MMT  Right eval Left eval  Hip flexion 3+ 3+  Hip extension    Hip abduction 3 (sitting) 3 (sitting)  Hip adduction    Hip internal rotation    Hip external rotation    Knee flexion 4 4  Knee extension 4+ 4+  Ankle dorsiflexion 4- 4-  Ankle plantarflexion    Ankle inversion    Ankle eversion     (Blank rows = not tested)   FUNCTIONAL TESTS:  5 times sit to stand: 32 seconds had to use BUEs, very poor eccentric control and often "plopped" onto the table  3 minute walk test: 34f rollator no rest breaks  Tandem stance: unable to hold longer than 9 seconds without BUE support or assist from PT   GAIT: Distance walked: 3254fAssistive device utilized: WaEnvironmental consultant 4 wheeled Level of assistance: SBA Comments: flexed trunk, tends to push rollator a bit far ahead of himself, slow pace, really easily fatigued  BERG: 25   TODAY'S TREATMENT: 9/14 Supine:  March 2x15  Bridge 2x15  Supine clamshell 2x10   Seated march 2x10   Seated shoulder flexion 2x15 2lbs  Seated Biceps curl 2x15 2lbs     9/11 All gym exercise performed with education ion proper fitting and safety considerations    Leg press lifestyles 25# 3x15  Hip abduction Machine 25 lbs 3x15  Row machine 3x15 10 lbs  Triceps press down 3x15 10 lbs   Air-ex normal base 2x30 sec hold  Air-ex narrow base 230 sec hold with min a  Step onto air-ex x15 each leg with min UE hold     9/7 Nu-step 6 min L3   Seated Hip abduction 20 green  Seated March x20 Green  Seated LAQ x20   Bilateral ER x20 green  Horizontal abduction x20 green   Air-ex normal base 2x30 sec hold  Air-ex narrow base 230 sec hold with min a  Step onto air-ex x15 each leg with min UE hold   9/5 Nu-step 6 min L3   Seated bicpes curl 2x15 2lbs  Seated press ups 2x15 1lb  Seated shoulder flexion 2x10 1lb  Shoulder scaption 2x10 1 lb    Heel /row raise x20 with minimal UE hold  Narrow base of support eo and ec 3x30 sec hold  Tandem stance  2x30 each leg forward   Step up 2x10 4 inch each leg     PATIENT EDUCATION:  Education details: exam findings, POC, HEP, education on general process of water therapy/transition to land  Person educated: Patient and Child(ren) Education method: Explanation Education comprehension: verbalized understanding and needs further education   HOME EXERCISE PROGRAM: Try to walk regularly [id number]  ASSESSMENT:  CLINICAL IMPRESSION: The patient has made great progress. He has several options as far as his exercises go. He has decided for at least now to work out at home. If he does decide to shift to a gym program, he was advised to get a session or two with our health and wellness specialist as he still has some safety issues getting on and off the machines. See below for goal specific progress. D/C to HEP   Pt requires presence of therapist in pool at all times due to decreased balance and decreased confidence in water.    OBJECTIVE IMPAIRMENTS Abnormal gait, cardiopulmonary status limiting activity, decreased activity tolerance, decreased balance, decreased coordination, decreased endurance, decreased knowledge of use of DME, decreased mobility, difficulty walking, decreased strength, decreased safety awareness, increased fascial restrictions, postural dysfunction, and obesity.   ACTIVITY LIMITATIONS carrying, lifting, bending, standing, squatting, stairs, transfers, bed mobility, bathing, locomotion level, and caring for others  PARTICIPATION LIMITATIONS: cleaning, shopping, and community activity  PERSONAL FACTORS Age, Behavior pattern, Education, Fitness, Social background, and Time since onset of injury/illness/exacerbation are also affecting patient's functional outcome.   REHAB POTENTIAL: Good  CLINICAL DECISION MAKING: Stable/uncomplicated  EVALUATION COMPLEXITY: Low   GOALS: Goals reviewed with patient? Yes  SHORT TERM GOALS: Target date: 07/11/2022  Will be compliant  with appropriate HEP  Baseline: Goal status: achieved. He is doing his exercises 5x a week   2.  Will be able to name 3 ways to reduce fall risk at home and in the community  Baseline:  Goal status: He is using his walker, being careful where he steps, and has been practicing walking outside  3.  Will be able to complete 5x sit to stand test in no more than 20 seconds  Baseline:  Goal status: achieved on the last re-assessment   4.  Will be able to complete all functional transfers safely with good hand placement, general sequencing, and safety awareness  Baseline:  Goal status: stability has improved significantly Achieved    LONG TERM GOALS: Target date: 08/08/2022   MMT to improve by at least one grade in all weak groups  Baseline:  Goal status: all muscles have improved   2.  Will score at least 48 on the Berg to show reduced fall risk  Baseline:  Goal status: Not tested   3.  Will be able to ambulate at least 523f on 3MWT with LRAD to show improved functional activity tolerance  Baseline:  Goal status achieved on re-assessment   4.  Will be compliant with regular progressive walking program at home  Baseline:  Goal status: 5x a week achived   5.  Will be compliant with gym based vs advanced HEP at [place] to maintain functional gains/prevent recurrence of deconditioning  Baseline:  Goal status: has decided not to do the gym     PLAN: PT FREQUENCY: 2x/week  PT DURATION: 8 weeks  PLANNED INTERVENTIONS: Therapeutic exercises, Therapeutic activity, Neuromuscular re-education, Balance training, Gait training, Patient/Family education, Joint mobilization, Stair training, DME instructions, Aquatic Therapy, Cognitive remediation, Cryotherapy, Moist heat, Taping, Ultrasound, Ionotophoresis '4mg'$ /ml Dexamethasone, Manual therapy, and Re-evaluation  PLAN FOR NEXT SESSION: monitor SOB, gastroc strength/ankle ROM  DCarolyne LittlesPT DPT  06/15/22 10:15  AM

## 2022-06-26 ENCOUNTER — Encounter: Payer: Self-pay | Admitting: *Deleted

## 2022-07-03 ENCOUNTER — Ambulatory Visit: Payer: Medicare Other | Admitting: Family Medicine

## 2022-07-11 ENCOUNTER — Encounter (HOSPITAL_BASED_OUTPATIENT_CLINIC_OR_DEPARTMENT_OTHER): Payer: Self-pay | Admitting: Nurse Practitioner

## 2022-07-11 ENCOUNTER — Ambulatory Visit (INDEPENDENT_AMBULATORY_CARE_PROVIDER_SITE_OTHER): Payer: Medicare Other | Admitting: Nurse Practitioner

## 2022-07-11 VITALS — BP 110/74 | HR 76 | Ht 72.0 in | Wt 208.0 lb

## 2022-07-11 DIAGNOSIS — I482 Chronic atrial fibrillation, unspecified: Secondary | ICD-10-CM

## 2022-07-11 DIAGNOSIS — K279 Peptic ulcer, site unspecified, unspecified as acute or chronic, without hemorrhage or perforation: Secondary | ICD-10-CM | POA: Diagnosis not present

## 2022-07-11 DIAGNOSIS — I10 Essential (primary) hypertension: Secondary | ICD-10-CM | POA: Diagnosis not present

## 2022-07-11 DIAGNOSIS — R609 Edema, unspecified: Secondary | ICD-10-CM | POA: Diagnosis not present

## 2022-07-11 DIAGNOSIS — Z23 Encounter for immunization: Secondary | ICD-10-CM

## 2022-07-11 DIAGNOSIS — R7302 Impaired glucose tolerance (oral): Secondary | ICD-10-CM

## 2022-07-11 DIAGNOSIS — D649 Anemia, unspecified: Secondary | ICD-10-CM

## 2022-07-11 NOTE — Patient Instructions (Addendum)
Thank you for choosing [place] at [facility] for your Primary Care needs. I am excited for the opportunity to partner with you to meet your health care goals. It was a pleasure meeting you today!  Recommendations from today's visit: It seems that most of your weight loss is related to smaller meals. I will certainly review labs and your history to see if there is anything that stands out as a concern.  I will get a few labs today to see how things are looking to make sure that everything is OK.  I do feel that formal physical therapy could be very helpful for you to keep you active and be easier on your joints and legs in the pool. Your heart rate is good today, but your rhythm does show the presence of atrial fibrillation. This means that your heart medication is working like it should.  To help keep the swelling down in your legs, I would like you to keep your feet elevated when you are sitting down. Avoid crossing your legs, this makes it harder for the blood to get back to your heart. Wearing the compression stockings could be very helpful if you have someone to help you get them on and off.  Information on diet, exercise, and health maintenance recommendations are listed below. This is information to help you be sure you are on track for optimal health and monitoring.   Please look over this and let us know if you have any questions or if you have completed any of the health maintenance outside of [place] so that we can be sure your records are up to date.  ___________________________________________________________ About Me: I am an Adult-Geriatric Nurse Practitioner with a background in caring for patients for more than 20 years with a strong intensive care background. I provide primary care and sports medicine services to patients age 79 and older within this office. My education had a strong focus on caring for the older adult population, which I am passionate  about. I am also the director of the APP Fellowship with [facility].   My desire is to provide you with the best service through preventive medicine and supportive care. I consider you a part of the medical team and value your input. I work diligently to ensure that you are heard and your needs are met in a safe and effective manner. I want you to feel comfortable with me as your provider and want you to know that your health concerns are important to me.  For your information, our office hours are: Monday, Tuesday, and Thursday 8:00 AM - 5:00 PM Wednesday and Friday 8:00 AM - 12:00 PM.   In my time away from the office I am teaching new APP's within the system and am unavailable, but my partner, Dr. Burnard Bunting is in the office for emergent needs.   If you have questions or concerns, please call our office at [phone] or send [place] a MyChart message and we will respond as quickly as possible.  ____________________________________________________________ MyChart:  For all urgent or time sensitive needs we ask that you please call the office to avoid delays. Our number is [phone]. MyChart is not constantly monitored and due to the large volume of messages a day, replies may take up to 72 business hours.  MyChart Policy: MyChart allows for you to see your visit notes, after visit summary, provider recommendations, lab and tests results, make an appointment, request refills, and contact your provider  or the office for non-urgent questions or concerns. Providers are seeing patients during normal business hours and do not have built in time to review MyChart messages.  We ask that you allow a minimum of 3 business days for responses to [employer]. For this reason, please do not send urgent requests through [place]. Please call the office at [phone]. New and ongoing conditions may require a visit. We have virtual and in person visit available for your convenience.  Complex MyChart  concerns may require a visit. Your provider may request you schedule a virtual or in person visit to ensure we are providing the best care possible. MyChart messages sent after 11:00 AM on Friday will not be received by the provider until Monday morning.    Lab and Test Results: You will receive your lab and test results on MyChart as soon as they are completed and results have been sent by the lab or testing facility. Due to this service, you will receive your results BEFORE your provider.  I review lab and tests results each morning prior to seeing patients. Some results require collaboration with other providers to ensure you are receiving the most appropriate care. For this reason, we ask that you please allow a minimum of 3-5 business days from the time the ALL results have been received for your provider to receive and review lab and test results and contact you about these.  Most lab and test result comments from the provider will be sent through [place]. Your provider may recommend changes to the plan of care, follow-up visits, repeat testing, ask questions, or request an office visit to discuss these results. You may reply directly to this message or call the office at [phone] to provide information for the provider or set up an appointment. In some instances, you will be called with test results and recommendations. Please let us know if this is preferred and we will make note of this in your chart to provide this for you.    If you have not heard a response to your lab or test results in 5 business days from all results returning to [place], please call the office to let us know. We ask that you please avoid calling prior to this time unless there is an emergent concern. Due to high call volumes, this can delay the resulting process.  After Hours: For all non-emergency after hours needs, please call the office at [phone] and select the option to reach the on-call provider service.  On-call services are shared between multiple [place] offices and therefore it will not be possible to speak directly with your provider. On-call providers may provide medical advice and recommendations, but are unable to provide refills for maintenance medications.  For all emergency or urgent medical needs after normal business hours, we recommend that you seek care at the closest Urgent Care or Emergency Department to ensure appropriate treatment in a timely manner.  [facility] [place] has a 24 hour emergency room located on the ground floor for your convenience.   Urgent Concerns During the Business Day Providers are seeing patients from 8AM to [place] with a busy schedule and are most often not able to respond to non-urgent calls until the end of the day or the next business day. If you should have URGENT concerns during the day, please call and speak to the nurse or schedule a same day appointment so that we can address your concern without delay.   Thank you, again,  for choosing me as your health care partner. I appreciate your trust and look forward to learning more about you.   Glen Keeler, DNP, AGNP-c ___________________________________________________________  Health Maintenance Recommendations Screening Testing Mammogram Every 1 -2 years based on history and risk factors Starting at age 45 Pap Smear Ages 21-39 every 3 years Ages 40-65 every 5 years with HPV testing More frequent testing may be required based on results and history Colon Cancer Screening Every 1-10 years based on test performed, risk factors, and history Starting at age 72 Bone Density Screening Every 2-10 years based on history Starting at age 67 for women Recommendations for men differ based on medication usage, history, and risk factors AAA Screening One time ultrasound Men 102-35 years old who have every smoked Lung Cancer Screening Low Dose Lung CT every 12 months Age 42-80 years  with a 30 pack-year smoking history who still smoke or who have quit within the last 15 years  Screening Labs Routine  Labs: Complete Blood Count (CBC), Complete Metabolic Panel (CMP), Cholesterol (Lipid Panel) Every 6-12 months based on history and medications May be recommended more frequently based on current conditions or previous results Hemoglobin A1c Lab Every 3-12 months based on history and previous results Starting at age 40 or earlier with diagnosis of diabetes, high cholesterol, BMI >26, and/or risk factors Frequent monitoring for patients with diabetes to ensure blood sugar control Thyroid Panel (TSH w/ T3 & T4) Every 6 months based on history, symptoms, and risk factors May be repeated more often if on medication HIV One time testing for all patients 64 and older May be repeated more frequently for patients with increased risk factors or exposure Hepatitis C One time testing for all patients 41 and older May be repeated more frequently for patients with increased risk factors or exposure Gonorrhea, Chlamydia Every 12 months for all sexually active persons 13-24 years Additional monitoring may be recommended for those who are considered high risk or who have symptoms PSA Men 95-1 years old with risk factors Additional screening may be recommended from age 50-69 based on risk factors, symptoms, and history  Vaccine Recommendations Tetanus Booster All adults every 10 years Flu Vaccine All patients 6 months and older every year COVID Vaccine All patients 12 years and older Initial dosing with booster May recommend additional booster based on age and health history HPV Vaccine 2 doses all patients age 74-26 Dosing may be considered for patients over 26 Shingles Vaccine (Shingrix) 2 doses all adults 3 years and older Pneumonia (Pneumovax 23) All adults 54 years and older May recommend earlier dosing based on health history Pneumonia (Prevnar 42) All adults 72  years and older Dosed 1 year after Pneumovax 23  Additional Screening, Testing, and Vaccinations may be recommended on an individualized basis based on family history, health history, risk factors, and/or exposure.  __________________________________________________________  Diet Recommendations for All Patients  I recommend that all patients maintain a diet low in saturated fats, carbohydrates, and cholesterol. While this can be challenging at first, it is not impossible and small changes can make big differences.  Things to try: Decreasing the amount of soda, sweet tea, and/or juice to one or less per day and replace with water While water is always the first choice, if you do not like water you may consider adding a water additive without sugar to improve the taste other sugar free drinks Replace potatoes with a brightly colored vegetable at dinner Use healthy oils, such as canola oil  or olive oil, instead of butter or hard margarine Limit your bread intake to two pieces or less a day Replace regular pasta with low carb pasta options Bake, broil, or grill foods instead of frying Monitor portion sizes  Eat smaller, more frequent meals throughout the day instead of large meals  An important thing to remember is, if you love foods that are not great for your health, you don't have to give them up completely. Instead, allow these foods to be a reward when you have done well. Allowing yourself to still have special treats every once in a while is a nice way to tell yourself thank you for working hard to keep yourself healthy.   Also remember that every day is a new day. If you have a bad day and "fall off the wagon", you can still climb right back up and keep moving along on your journey!  We have resources available to help you!  Some websites that may be helpful include: www.http://carter.biz/  Www.VeryWell[employer].com _____________________________________________________________  Activity  Recommendations for All Patients  I recommend that all adults get at least 20 minutes of moderate physical activity that elevates your heart rate at least 5 days out of the week.  Some examples include: Walking or jogging at a pace that allows you to carry on a conversation Cycling (stationary bike or outdoors) Water aerobics Yoga Weight lifting Dancing If physical limitations prevent you from putting stress on your joints, exercise in a pool or seated in a chair are excellent options.  Do determine your MAXIMUM heart rate for activity: YOUR AGE - 220 = MAX HeartRate   Remember! Do not push yourself too hard.  Start slowly and build up your pace, speed, weight, time in exercise, etc.  Allow your body to rest between exercise and get good sleep. You will need more water than normal when you are exerting yourself. Do not wait until you are thirsty to drink. Drink with a purpose of getting in at least 8, 8 ounce glasses of water a day plus more depending on how much you exercise and sweat.    If you begin to develop dizziness, chest pain, abdominal pain, jaw pain, shortness of breath, headache, vision changes, lightheadedness, or other concerning symptoms, stop the activity and allow your body to rest. If your symptoms are severe, seek emergency evaluation immediately. If your symptoms are concerning, but not severe, please let us know so that we can recommend further evaluation.

## 2022-07-11 NOTE — Progress Notes (Signed)
Orma Render, DNP, AGNP-c [facility] [address]  [place], [place] [address] [phone]  New patient visit   Patient: Glen [name]   DOB: October 21, 1927   88 y.o. Male  MRN: [id number] Visit Date: 07/11/2022  Patient Care Team: Tanessa Tidd, Coralee Pesa, NP as PCP - General (Nurse Practitioner) Buford Dresser, MD as PCP - Cardiology (Cardiology) Sharyon Medicus, MD as Rounding Team (Internal Medicine)  Today's Vitals   07/11/22 0809  BP: 110/74  Pulse: 76  SpO2: 98%  Weight: 208 lb (94.3 kg)  Height: 6' (1.829 m)   Body mass index is 28.21 kg/m.   Today's healthcare provider: Orma Render, NP   Chief Complaint  Patient presents with   New Patient (Initial Visit)    Presents today to establish care. Patient would like flu shot today while in office.    Subjective    Glen [name] is a 88 y.o. male who presents today as a new patient to establish care.    Here with daughter in law today.  Patient endorses the following concerns presently:  Has dropped 65lbs in the last several years and had the dilation of the esophagus x 3  He tells me that he has not had many problems with food "coming back up" since the third dilation of the esophagus.  His daughter in law tells me he that he primarily eats cheese and crackers and ice cream.  He tells me over the last year he may have lost about 10 lbs, but he is not eating as much as he has in the past.  He tells me he typically has a liquid breakfast with blended cereal and protein drink. He also has lunch. His daughter in law tells me he primarily eats ice cream for dinner.   He is married.   His duaghter in law tells me he does not always sleep through the night. She tells me that he falls asleep ok but he wakes up in the middle of the night and does not go back to sleep. He gets about 4-5 hous of sleep a night. He typically naps about 1h or less after lunch daily.   He is  concerned about the swelling in his legs and the "crinkled" veins. He would like to know what can be done to help with this.   No longer drives  He tells me that he walks his driveway ([address] yards) at least one and a half times. He feels tired when he finishes.  He was previously in PT with water therapy and felt like he did better at home than formal PT.  He tells me he thinks he is active and does "pretty good for his age". DIL tells me he is more mobile now after the PT.  He would like to start PT again after the beginning of the year if his insurance will cover it.   He denies afib.   History reviewed and reveals the following: Past Medical History:  Diagnosis Date   Atrial fibrillation [facility])    Atrial fibrillation, chronic ([place]) 03/13/2008   Qualifier: Diagnosis of  By: Burnice Logan  MD, Doretha Sou    Diverticulitis    Esophageal dysmotility    Esophageal dysphagia 07/29/2018   Family history of diabetes insipidus    FHx: colon cancer    H. pylori infection    Hiatal hernia    History of colonic polyps    Hyperlipidemia  Hypertension    IDA (iron deficiency anemia)    Neoplasm of prostate    special screening malignant    Obesity    Palpitation    Peptic ulcer disease    Presbyesophagus    Schatzki's ring    Past Surgical History:  Procedure Laterality Date   2-D Echocardiogram  November 2009, 2011   Anal Fistula Repair     BALLOON DILATION N/A 01/17/2022   Procedure: Larrie Kass DILATION;  Surgeon: Jackquline Denmark, MD;  Location: [facility] ENDOSCOPY;  Service: Gastroenterology;  Laterality: N/A;   BIOPSY  07/14/2020   Procedure: BIOPSY;  Surgeon: [name], MD;  Location: [facility] ENDOSCOPY;  Service: Gastroenterology;;   BIOPSY  01/17/2022   Procedure: BIOPSY;  Surgeon: Jackquline Denmark, MD;  Location: [facility] ENDOSCOPY;  Service: Gastroenterology;;   CARDIAC VALVE SURGERY  1996   status post balloon valvuloplasty at [facility]  2004   COLONOSCOPY  December 2005    ESOPHAGOGASTRODUODENOSCOPY  07/14/2020   ESOPHAGOGASTRODUODENOSCOPY N/A 01/17/2022   Procedure: ESOPHAGOGASTRODUODENOSCOPY (EGD);  Surgeon: Jackquline Denmark, MD;  Location: [facility];  Service: Gastroenterology;  Laterality: N/A;   ESOPHAGOGASTRODUODENOSCOPY (EGD) WITH PROPOFOL N/A 07/14/2020   Procedure: ESOPHAGOGASTRODUODENOSCOPY (EGD) WITH PROPOFOL;  Surgeon: [name], MD;  Location: [facility] ENDOSCOPY;  Service: Gastroenterology;  Laterality: N/A;   ETT  1991   Negative   HEMOSTASIS CLIP PLACEMENT  07/14/2020   HEMOSTASIS CLIP PLACEMENT   HEMOSTASIS CLIP PLACEMENT  07/14/2020   Procedure: HEMOSTASIS CLIP PLACEMENT;  Surgeon: [name], MD;  Location: [facility] ENDOSCOPY;  Service: Gastroenterology;;   KNEE ARTHROSCOPY     Right   TONSILLECTOMY     Family Status  Relation Name Status   Mother  Deceased at age 73       Colon cancer   Father  Deceased at age 53       Myocardial infarction   Brother  (Not Specified)   Brother  (Not Specified)   MGM  Deceased   MGF  Deceased   PGM  Deceased   PGF  Deceased   Other  (Not Specified)   Other  (Not Specified)   Other  (Not Specified)   Other  (Not Specified)   Neg Hx  (Not Specified)   Family History  Problem Relation Age of Onset   Heart attack Father    Prostate cancer Brother    Stroke Brother    Colon cancer Other    Diabetes Other    Heart disease Other    Coronary artery disease Other        Two siblings, status post stenting to siblings with heart disease. One. Status post pacemaker insertion.   Esophageal cancer Neg Hx    Social History   Socioeconomic History   Marital status: Married    Spouse name: Not on file   Number of children: Not on file   Years of education: Not on file   Highest education level: Not on file  Occupational History   Occupation: Retired    Fish farm manager: RETIRED  Tobacco Use   Smoking status: Never   Smokeless tobacco: Never  Vaping Use   Vaping Use: Never used  Substance and Sexual  Activity   Alcohol use: No   Drug use: Never   Sexual activity: Not on file  Other Topics Concern   Not on file  Social History Narrative   Regular exercise: Yes: [employer] 4 times/week   Social Determinants of Health   Financial  Resource Strain: Low Risk  (12/26/2021)   Overall Financial Resource Strain (CARDIA)    Difficulty of Paying Living Expenses: Not hard at all  Food Insecurity: No Food Insecurity (12/26/2021)   Hunger Vital Sign    Worried About Running Out of Food in the Last Year: Never true    Ran Out of Food in the Last Year: Never true  Transportation Needs: No Transportation Needs (12/26/2021)   PRAPARE - Hydrologist (Medical): No    Lack of Transportation (Non-Medical): No  Physical Activity: Insufficiently Active (12/26/2021)   Exercise Vital Sign    Days of Exercise per Week: 5 days    Minutes of Exercise per Session: 10 min  Stress: No Stress Concern Present (12/26/2021)   [place]    Feeling of Stress : Not at all  Social Connections: Moderately Isolated (12/26/2021)   Social Connection and Isolation Panel [NHANES]    Frequency of Communication with Friends and Family: More than three times a week    Frequency of Social Gatherings with Friends and Family: Once a week    Attends Religious Services: Never    Marine scientist or Organizations: No    Attends Archivist Meetings: Never    Marital Status: Married   Outpatient Medications Prior to Visit  Medication Sig   furosemide (LASIX) 20 MG tablet Take 1 tablet by mouth once daily.   metoprolol tartrate (LOPRESSOR) 100 MG tablet Take 1 tablet by mouth twice daily.   psyllium (METAMUCIL) 58.6 % packet Take 2 packets by mouth daily.   [DISCONTINUED] furosemide (LASIX) 20 MG tablet Take 1 tablet (20 mg total) by mouth daily. (Patient not taking: Reported on 07/11/2022)   [DISCONTINUED] metoprolol tartrate  (LOPRESSOR) 100 MG tablet Take 1 tablet (100 mg total) by mouth 2 (two) times daily. (Patient not taking: Reported on 07/11/2022)   [DISCONTINUED] omeprazole (PRILOSEC) 40 MG capsule Take 1 capsule by mouth twice daily.   No facility-administered medications prior to visit.   Allergies  Allergen Reactions   Protonix [Pantoprazole] Other (See Comments)    Hallucinations   Codeine Other (See Comments)    constpation   Ferrous Sulfate Itching and Swelling   Immunization History  Administered Date(s) Administered   Fluad Quad(high Dose 65+) 06/25/2019, 06/21/2020, 06/27/2021   Influenza Split 08/23/2011, 06/25/2012   Influenza Whole 10/02/2005, 08/11/2009, 08/04/2010   Influenza, High Dose Seasonal PF 07/15/2015, 06/26/2016, 07/23/2017, 07/03/2018   Influenza, Quadrivalent, Recombinant, Inj, Pf 07/11/2022   Influenza,inj,Quad PF,6+ Mos 07/07/2013, 06/29/2014   PFIZER(Purple Top)SARS-COV-2 Vaccination 10/24/2019, 11/14/2019, 07/01/2020, 08/10/2021   Pneumococcal Conjugate-13 10/17/2013   Pneumococcal Polysaccharide-23 10/02/2005   Td 03/17/2009    Review of Systems All review of systems negative except what is listed in the HPI   Objective    BP 110/74   Pulse 76   Ht 6' (1.829 m)   Wt 208 lb (94.3 kg)   SpO2 98%   BMI 28.21 kg/m  Physical Exam Vitals and nursing note reviewed.  Constitutional:      Appearance: Normal appearance.  HENT:     Head: Normocephalic.  Eyes:     Extraocular Movements: Extraocular movements intact.     Conjunctiva/sclera: Conjunctivae normal.     Pupils: Pupils are equal, round, and reactive to light.  Neck:     Vascular: No carotid bruit.  Cardiovascular:     Rate and Rhythm: Normal rate. Rhythm irregular.  Pulses: Normal pulses.     Heart sounds: Normal heart sounds. No murmur heard. Pulmonary:     Effort: Pulmonary effort is normal.     Breath sounds: Normal breath sounds. No wheezing.  Abdominal:     General: Abdomen is flat.  Bowel sounds are normal. There is no distension.     Palpations: Abdomen is soft.     Tenderness: There is no abdominal tenderness. There is no guarding.  Musculoskeletal:        General: Normal range of motion.     Cervical back: Normal range of motion.     Right lower leg: Edema present.     Left lower leg: Edema present.  Lymphadenopathy:     Cervical: No cervical adenopathy.  Skin:    General: Skin is warm and dry.     Capillary Refill: Capillary refill takes less than 2 seconds.  Neurological:     General: No focal deficit present.     Mental Status: He is alert and oriented to person, place, and time.     Motor: No weakness.  Psychiatric:        Mood and Affect: Mood normal.        Behavior: Behavior normal.        Thought Content: Thought content normal.        Judgment: Judgment normal.     Results for orders placed or performed in visit on 07/11/22  CBC With Diff/Platelet  Result Value Ref Range   WBC CANCELED x10E3/uL   RBC CANCELED x10E6/uL   Hemoglobin CANCELED g/dL   Hematocrit CANCELED %   MCV CANCELED fL   MCH CANCELED pg   MCHC CANCELED g/dL   RDW CANCELED %   Platelets CANCELED x10E3/uL   Neutrophils CANCELED %   Lymphs CANCELED %   Monocytes CANCELED %   Eos CANCELED %   Basos CANCELED %   Immature Cells CANCELED    Neutrophils Absolute CANCELED x10E3/uL   Lymphocytes Absolute CANCELED x10E3/uL   Monocytes Absolute CANCELED x10E3/uL   EOS (ABSOLUTE) CANCELED x10E3/uL   Basophils Absolute CANCELED x10E3/uL   Immature Granulocytes CANCELED %   Immature Grans (Abs) CANCELED x10E3/uL   NRBC CANCELED %   Hematology Comments: CANCELED   Comprehensive metabolic panel  Result Value Ref Range   Glucose 83 70 - 99 mg/dL   BUN 23 10 - 36 mg/dL   Creatinine, Ser 0.82 0.76 - 1.27 mg/dL   eGFR 82 >59 mL/min/1.73   BUN/Creatinine Ratio 28 (H) 10 - 24   Sodium 139 134 - 144 mmol/L   Potassium 5.0 3.5 - 5.2 mmol/L   Chloride 103 96 - 106 mmol/L   CO2 23  20 - 29 mmol/L   Calcium 9.2 8.6 - 10.2 mg/dL   Total Protein 6.0 6.0 - 8.5 g/dL   Albumin 4.0 3.6 - 4.6 g/dL   Globulin, Total 2.0 1.5 - 4.5 g/dL   Albumin/Globulin Ratio 2.0 1.2 - 2.2   Bilirubin Total 0.5 0.0 - 1.2 mg/dL   Alkaline Phosphatase 98 44 - 121 IU/L   AST 23 0 - 40 IU/L   ALT 19 0 - 44 IU/L  Hemoglobin A1c  Result Value Ref Range   Hgb A1c MFr Bld 5.6 4.8 - 5.6 %   Est. average glucose Bld gHb Est-mCnc 114 mg/dL  Lipid panel  Result Value Ref Range   Cholesterol, Total 131 100 - 199 mg/dL   Triglycerides 101 0 - 149 mg/dL  HDL 42 >39 mg/dL   VLDL Cholesterol Cal 19 5 - 40 mg/dL   LDL Chol Calc (NIH) 70 0 - 99 mg/dL   Chol/HDL Ratio 3.1 0.0 - 5.0 ratio  Iron, TIBC and Ferritin Panel  Result Value Ref Range   Total Iron Binding Capacity 233 (L) 250 - 450 ug/dL   UIBC 137 111 - 343 ug/dL   Iron 96 38 - 169 ug/dL   Iron Saturation 41 15 - 55 %   Ferritin 201 30 - 400 ng/mL    Assessment & Plan      Problem List Items Addressed This Visit     Essential hypertension    Chronic. Well controlled at this time with no alarm sx. Close monitoring for hypotension recommended given age. Will follow closely.       Relevant Orders   CBC With Diff/Platelet (Completed)   Comprehensive metabolic panel (Completed)   Hemoglobin A1c (Completed)   Lipid panel (Completed)   Iron, TIBC and Ferritin Panel (Completed)   Chronic atrial fibrillation ([place]) - Primary    Rate controlled today. Discussion with patient today that this is present and likely persistent. At this time no further recommendations. He is not anticoagulated at this time, however, will leave this recommendation to cardiology. Risks likely outweigh benefits given age. Monitor closely. Patient likely needs reinforcement that this is a contributing factor to LE edema along with poor vascular circulation and varicosities.       Relevant Orders   CBC With Diff/Platelet (Completed)   Comprehensive metabolic panel  (Completed)   Hemoglobin A1c (Completed)   Lipid panel (Completed)   Iron, TIBC and Ferritin Panel (Completed)   Impaired glucose tolerance    Will monitor labs today.       Relevant Orders   CBC With Diff/Platelet (Completed)   Comprehensive metabolic panel (Completed)   Hemoglobin A1c (Completed)   Lipid panel (Completed)   Iron, TIBC and Ferritin Panel (Completed)   Peripheral edema    Chronic. Suspect atrial fibrillation, dependent edema, poor peripheral circulation, and impaired cardiac function all contributory. Recommend continue lasix, compression stockings, and elevation of LE while seated. He is unable to get compression stockings on without assistance. Discussed that surgical interventions are not likely an option at this point, but will be happy to place referral to vein and vascular if he would like.       Relevant Orders   CBC With Diff/Platelet (Completed)   Comprehensive metabolic panel (Completed)   Hemoglobin A1c (Completed)   Lipid panel (Completed)   Iron, TIBC and Ferritin Panel (Completed)   Peptic ulcer disease    Chronic. No alarm sx present at this time.       Relevant Orders   CBC With Diff/Platelet (Completed)   Comprehensive metabolic panel (Completed)   Hemoglobin A1c (Completed)   Lipid panel (Completed)   Iron, TIBC and Ferritin Panel (Completed)   Anemia, unspecified    Chronic. Labs today.       Relevant Orders   CBC With Diff/Platelet (Completed)   Comprehensive metabolic panel (Completed)   Hemoglobin A1c (Completed)   Lipid panel (Completed)   Iron, TIBC and Ferritin Panel (Completed)   Other Visit Diagnoses     Flu vaccine need       Relevant Orders   Flu vaccine, recombinat, quadrivalent, inj (Completed)        Return in about 4 months (around 11/11/2022) for General F/U.      Orma Render, NP,  DNP, AGNP-C [facility] at [facility]  Due Health Maintenance Topics with due status: Overdue     Topic Date Due   COVID-19 Vaccine 10/05/2021

## 2022-07-12 LAB — CBC WITH DIFF/PLATELET

## 2022-07-12 LAB — HEMOGLOBIN A1C
Est. average glucose Bld gHb Est-mCnc: 114 mg/dL
Hgb A1c MFr Bld: 5.6 % (ref 4.8–5.6)

## 2022-07-12 LAB — COMPREHENSIVE METABOLIC PANEL
ALT: 19 IU/L (ref 0–44)
AST: 23 IU/L (ref 0–40)
Albumin/Globulin Ratio: 2 (ref 1.2–2.2)
Albumin: 4 g/dL (ref 3.6–4.6)
Alkaline Phosphatase: 98 IU/L (ref 44–121)
BUN/Creatinine Ratio: 28 — ABNORMAL HIGH (ref 10–24)
BUN: 23 mg/dL (ref 10–36)
Bilirubin Total: 0.5 mg/dL (ref 0.0–1.2)
CO2: 23 mmol/L (ref 20–29)
Calcium: 9.2 mg/dL (ref 8.6–10.2)
Chloride: 103 mmol/L (ref 96–106)
Creatinine, Ser: 0.82 mg/dL (ref 0.76–1.27)
Globulin, Total: 2 g/dL (ref 1.5–4.5)
Glucose: 83 mg/dL (ref 70–99)
Potassium: 5 mmol/L (ref 3.5–5.2)
Sodium: 139 mmol/L (ref 134–144)
Total Protein: 6 g/dL (ref 6.0–8.5)
eGFR: 82 mL/min/{1.73_m2} (ref >59)

## 2022-07-12 LAB — IRON,TIBC AND FERRITIN PANEL
Ferritin: 201 ng/mL (ref 30–400)
Iron Saturation: 41 % (ref 15–55)
Iron: 96 ug/dL (ref 38–169)
Total Iron Binding Capacity: 233 ug/dL — ABNORMAL LOW (ref 250–450)
UIBC: 137 ug/dL (ref 111–343)

## 2022-07-12 LAB — LIPID PANEL
Chol/HDL Ratio: 3.1 ratio (ref 0.0–5.0)
Cholesterol, Total: 131 mg/dL (ref 100–199)
HDL: 42 mg/dL (ref >39)
LDL Chol Calc (NIH): 70 mg/dL (ref 0–99)
Triglycerides: 101 mg/dL (ref 0–149)
VLDL Cholesterol Cal: 19 mg/dL (ref 5–40)

## 2022-07-17 ENCOUNTER — Encounter (HOSPITAL_BASED_OUTPATIENT_CLINIC_OR_DEPARTMENT_OTHER): Payer: Self-pay | Admitting: Nurse Practitioner

## 2022-07-17 NOTE — Assessment & Plan Note (Signed)
Will monitor labs today.

## 2022-07-17 NOTE — Assessment & Plan Note (Signed)
Chronic. No alarm sx present at this time.

## 2022-07-17 NOTE — Assessment & Plan Note (Signed)
Chronic. Well controlled at this time with no alarm sx. Close monitoring for hypotension recommended given age. Will follow closely.

## 2022-07-17 NOTE — Assessment & Plan Note (Signed)
Chronic. Suspect atrial fibrillation, dependent edema, poor peripheral circulation, and impaired cardiac function all contributory. Recommend continue lasix, compression stockings, and elevation of LE while seated. He is unable to get compression stockings on without assistance. Discussed that surgical interventions are not likely an option at this point, but will be happy to place referral to vein and vascular if he would like.

## 2022-07-17 NOTE — Assessment & Plan Note (Signed)
Rate controlled today. Discussion with patient today that this is present and likely persistent. At this time no further recommendations. He is not anticoagulated at this time, however, will leave this recommendation to cardiology. Risks likely outweigh benefits given age. Monitor closely. Patient likely needs reinforcement that this is a contributing factor to LE edema along with poor vascular circulation and varicosities.

## 2022-07-17 NOTE — Assessment & Plan Note (Signed)
Chronic. Labs today.  

## 2022-07-24 ENCOUNTER — Ambulatory Visit (HOSPITAL_BASED_OUTPATIENT_CLINIC_OR_DEPARTMENT_OTHER): Payer: Medicare Other | Admitting: Physical Therapy

## 2022-08-04 ENCOUNTER — Other Ambulatory Visit (HOSPITAL_BASED_OUTPATIENT_CLINIC_OR_DEPARTMENT_OTHER): Payer: Self-pay

## 2022-08-23 ENCOUNTER — Other Ambulatory Visit (HOSPITAL_BASED_OUTPATIENT_CLINIC_OR_DEPARTMENT_OTHER): Payer: Self-pay | Admitting: Nurse Practitioner

## 2022-08-23 ENCOUNTER — Other Ambulatory Visit (HOSPITAL_BASED_OUTPATIENT_CLINIC_OR_DEPARTMENT_OTHER): Payer: Self-pay

## 2022-08-25 ENCOUNTER — Other Ambulatory Visit (HOSPITAL_BASED_OUTPATIENT_CLINIC_OR_DEPARTMENT_OTHER): Payer: Self-pay

## 2022-08-25 DIAGNOSIS — Z23 Encounter for immunization: Secondary | ICD-10-CM | POA: Diagnosis not present

## 2022-08-25 MED ORDER — COMIRNATY 30 MCG/0.3ML IM SUSY
PREFILLED_SYRINGE | INTRAMUSCULAR | 0 refills | Status: DC
  Filled 2022-08-25: qty 0.3, 1d supply, fill #0

## 2022-08-28 MED ORDER — METOPROLOL TARTRATE 100 MG PO TABS
ORAL_TABLET | ORAL | 2 refills | Status: DC
  Filled 2022-08-28: qty 180, 90d supply, fill #0

## 2022-08-29 ENCOUNTER — Other Ambulatory Visit (HOSPITAL_BASED_OUTPATIENT_CLINIC_OR_DEPARTMENT_OTHER): Payer: Self-pay

## 2022-08-30 ENCOUNTER — Other Ambulatory Visit (HOSPITAL_BASED_OUTPATIENT_CLINIC_OR_DEPARTMENT_OTHER): Payer: Self-pay

## 2022-08-31 ENCOUNTER — Other Ambulatory Visit (HOSPITAL_BASED_OUTPATIENT_CLINIC_OR_DEPARTMENT_OTHER): Payer: Self-pay

## 2022-09-27 NOTE — Progress Notes (Signed)
Cardiology Office Note:    Date:  10/04/2022   ID:  Glen [name], DOB 1928/09/19, MRN [id number]  PCP:  Glen Render, NP  Cardiologist:  Glen Dresser, MD   CC: follow-up  History of Present Illness:    Glen [name] is a 88 y.o. male with a hx of hypertension, hyperlidemia and permanent AF, previously on coumadin but now on no anticoagulation who is seen for follow up.   Pertinent history: 07/2020 hospitalization for GI bleed. ECG 07/14/20 afib RVR with PVC. Only cardiac medication at that visit was metoprolol, and heart rate was 95 bpm. I discussed changing to extended release metoprolol but he declined and would prefer to keep tartrate, as he said this was what Dr. Claiborne [name] had recommended to him. He was difficult to keep on task during the interview. He had chronic LE edema, which he endorsed is from the metoprolol. We discussed chronic venous insufficiency in depth. Could not feel his afib.  He had been admitted to the hospital 08/09/2020 with nausea and black tarry stools. He was discharged on omeprazole 40 mg twice daily. He was last seen in cardiology by Dr. Claiborne [name] 08/08/2021 where he reported  a bleeding ulcer, and a typical heart rate in the 90s. He continued to have leg swelling R > L. It was planned to start a slight additional titration of metoprolol to tartrate to 125 mg in the morning and he would continue with the 100 mg in the evening. Also recommended to wear compression socks. He was admitted to the hospital 01/15/2022 with worsening dysphagia for one week with associated nausea and vomiting.  At his last appointment, he complained of headaches with significant radiating pain from the top of his head to his back, and then to the soles of his feet. These occurred 2-3 times a week. He was going to start aqua-therapy and planned to begin physical therapy afterwards. We have discussed anticoagulation at length. I recommended apixaban over coumadin, but he is concerned about  potential for recurrent GI bleed. We discussed risk of stroke vs. Bleed at length. He understood the risk and elected not to restart anticoagulation.  Today, he is accompanied by his daughter. Currently he takes Lasix once a day. However, he would like to increase this since he has noticed more improvement in his LE edema when taking Lasix twice a day.  At times his head "feels woozy" for a second when walking from the kitchen to the office. He may have to breathe a little heavier with walking, but he does not feel limited by this. No syncope.  Usually he walks 600-900 yards for exercise, mostly in his yard or driveway. He completes this 5-6 times a week. No falls in the past 3-4 years. Typically he has more difficulty with standing for prolonged periods.  Lately he denies any significant hematochezia; no melena. When first getting up to use the restroom he has mild back pain. Generally his pain improves throughout the day.  He denies any palpitations, chest pain, headaches, orthopnea, or PND.  We discussed recommendations for receiving the RSV vaccination from a cardiac perspective.  Separately, daughter notes concern for worsening short term memory and intermittent anger/outbursts. Since their PCP has left, she is asking for options. We discussed [facility], see below.   Past Medical History:  Diagnosis Date   Atrial fibrillation [facility])    Atrial fibrillation, chronic ([place]) 03/13/2008   Qualifier: Diagnosis of  By: Glen Logan  MD, Glen [name]  Diverticulitis    Esophageal dysmotility    Esophageal dysphagia 07/29/2018   Family history of diabetes insipidus    FHx: colon cancer    H. pylori infection    Hiatal hernia    History of colonic polyps    Hyperlipidemia    Hypertension    IDA (iron deficiency anemia)    Neoplasm of prostate    special screening malignant    Obesity    Palpitation    Peptic ulcer disease    Presbyesophagus    Schatzki's ring     Past  Surgical History:  Procedure Laterality Date   2-D Echocardiogram  November 2009, 2011   Anal Fistula Repair     BALLOON DILATION N/A 01/17/2022   Procedure: Larrie Kass DILATION;  Surgeon: Glen Denmark, MD;  Location: [facility];  Service: Gastroenterology;  Laterality: N/A;   BIOPSY  07/14/2020   Procedure: BIOPSY;  Surgeon: Glen Bears, MD;  Location: [facility] ENDOSCOPY;  Service: Gastroenterology;;   BIOPSY  01/17/2022   Procedure: BIOPSY;  Surgeon: Glen Denmark, MD;  Location: [facility] ENDOSCOPY;  Service: Gastroenterology;;   CARDIAC VALVE SURGERY  1996   status post balloon valvuloplasty at [facility]  2004   COLONOSCOPY  December 2005   ESOPHAGOGASTRODUODENOSCOPY  07/14/2020   ESOPHAGOGASTRODUODENOSCOPY N/A 01/17/2022   Procedure: ESOPHAGOGASTRODUODENOSCOPY (EGD);  Surgeon: Glen Denmark, MD;  Location: [facility]OS[facility];  Service: Gastroenterology;  Laterality: N/A;   ESOPHAGOGASTRODUODENOSCOPY (EGD) WITH PROPOFOL N/A 07/14/2020   Procedure: ESOPHAGOGASTRODUODENOSCOPY (EGD) WITH PROPOFOL;  Surgeon: Glen Bears, MD;  Location: [facility] ENDOSCOPY;  Service: Gastroenterology;  Laterality: N/A;   ETT  1991   Negative   HEMOSTASIS CLIP PLACEMENT  07/14/2020   HEMOSTASIS CLIP PLACEMENT   HEMOSTASIS CLIP PLACEMENT  07/14/2020   Procedure: HEMOSTASIS CLIP PLACEMENT;  Surgeon: Glen Bears, MD;  Location: [facility] ENDOSCOPY;  Service: Gastroenterology;;   KNEE ARTHROSCOPY     Right   TONSILLECTOMY      Current Medications: Current Outpatient Medications on File Prior to Visit  Medication Sig   COVID-19 mRNA vaccine 2023-2024 (COMIRNATY) syringe Inject into the muscle.   metoprolol tartrate (LOPRESSOR) 100 MG tablet Take 1 tablet by mouth twice daily.   psyllium (METAMUCIL) 58.6 % packet Take 2 packets by mouth daily.   No current facility-administered medications on file prior to visit.   actually taking 75 mg BID metoprolol  Allergies:   Protonix [pantoprazole], Codeine, and Ferrous  sulfate   Social History   Tobacco Use   Smoking status: Never   Smokeless tobacco: Never  Vaping Use   Vaping Use: Never used  Substance Use Topics   Alcohol use: No   Drug use: Never    Family History: The patient's family history includes Colon cancer in an other family member; Coronary artery disease in an other family member; Diabetes in an other family member; Heart attack in his father; Heart disease in an other family member; Prostate cancer in his brother; Stroke in his brother. There is no history of Esophageal cancer. His father had a faulty heart valve and died at 86 yo due to a fall.   ROS:   Please see the history of present illness.   (+) BLE edema (+) Lightheadedness (+) Shortness of breath (+) Mild back pain Additional pertinent ROS otherwise unremarkable.  EKGs/Labs/Other Studies Reviewed:    The following studies were reviewed today:  Echo 06/01/2021:  1. Hypokinesis of the basal septum. Left ventricular ejection fraction,  by  estimation, is 55 to 60%. The left ventricle has normal function. The  left ventricle demonstrates regional wall motion abnormalities (see  scoring diagram/findings for  description). Left ventricular diastolic parameters are indeterminate.   2. Right ventricular systolic function is normal. The right ventricular  size is normal. There is normal pulmonary artery systolic pressure.   3. Left atrial size was severely dilated.   4. The mitral valve is normal in structure. No evidence of mitral valve  regurgitation. No evidence of mitral stenosis.   5. The aortic valve is tricuspid. There is mild calcification of the  aortic valve. There is mild thickening of the aortic valve. Aortic valve  regurgitation is trivial. No aortic stenosis is present.   6. Aortic dilatation noted. There is moderate dilatation of the aortic  root, measuring 47 mm. There is moderate dilatation of the ascending  aorta, measuring 45 mm.   7. The inferior vena  cava is normal in size with greater than 50%  respiratory variability, suggesting right atrial pressure of 3 mmHg.   Echo from 2016: Left ventricle: The cavity size was normal. There was mild   concentric hypertrophy. Systolic function was normal. The   estimated ejection fraction was in the range of 55% to 60%. Wall   motion was normal; there were no regional wall motion   abnormalities. - Aortic valve: There was trivial regurgitation. - Left atrium: The atrium was moderately dilated.  EKG:  ECG is personally reviewed.  10/04/2022:  atrial fibrillation at 80 bpm 08/08/2021 (Dr. Claiborne [name]): Atrial fibrillation at 96 bpm. 07/20/2020: atrial fibrillation at a rate of 95 bpm.  Recent Labs: 12/26/2021: TSH 2.83 01/17/2022: Magnesium 1.9 07/11/2022: ALT 19; BUN 23; Creatinine, Ser 0.82; Hemoglobin CANCELED; Platelets CANCELED; Potassium 5.0; Sodium 139   Recent Lipid Panel    Component Value Date/Time   CHOL 131 07/11/2022 0933   TRIG 101 07/11/2022 0933   TRIG 150 (H) 09/06/2006 1031   HDL 42 07/11/2022 0933   CHOLHDL 3.1 07/11/2022 0933   CHOLHDL 4 12/26/2021 0918   VLDL 21.2 12/26/2021 0918   LDLCALC 70 07/11/2022 0933   LDLDIRECT 126.0 04/19/2015 1000    Physical Exam:    VS:  BP 108/64 (BP Location: Left Arm, Patient Position: Sitting, Cuff Size: Normal)   Pulse 80   Ht 6' (1.829 m)   Wt 206 lb 14.4 oz (93.8 kg)   BMI 28.06 kg/m     Wt Readings from Last 3 Encounters:  10/04/22 206 lb 14.4 oz (93.8 kg)  07/11/22 208 lb (94.3 kg)  04/07/22 208 lb 3.2 oz (94.4 kg)    GEN: Well nourished, well developed in no acute distress HEENT: Normal, moist mucous membranes NECK: No JVD CARDIAC: irregularly irregular rhythm, normal S1 and S2, no rubs or gallops. No murmur. VASCULAR: Radial pulses 2+ bilaterally. No carotid bruits RESPIRATORY:  Clear to auscultation without rales, wheezing or rhonchi  ABDOMEN: Soft, non-tender, non-distended MUSCULOSKELETAL:  Moves all 4 limbs  independently, ambulates with rollator SKIN: Warm and dry. Trivial bilateral nonpitting edema NEUROLOGIC:  Alert and oriented x 3. No focal neuro deficits noted. PSYCHIATRIC:  Normal affect   ASSESSMENT:    1. Permanent atrial fibrillation ([place])   2. Secondary hypercoagulable state ([place])   3. Essential hypertension   4. Medication management   5. Bilateral leg edema   6. History of GI bleed     PLAN:    LE edema: -most suggestive of chronic venous insufficiency, though may also have  contribution from permanent afib -notes improvement with BID lasix. Changed to this today, will check labs -recommend compression stockings, elevation  History of food impaction/dysphagia, s/p esophageal dilation History of GI bleeding -see prior notes. He denies bleeding today, but declines anticoagulation, reviewed prior discussion  Atrial fibrillation, permanent -H/H has been stable, no recent bleeding This patients CHA2DS2-VASc Score and unadjusted Ischemic Stroke Rate (% per year) is equal to 4.8 % stroke rate/year from a score of 4 -rate controlled with metoprolol tartrate 100 mg BID -we have discussed anticoagulation at length. He has spent time doing research and does not want anticoagulation at this time. I recommended apixaban over coumadin, but he is concerned about potential for recurrent GI bleed. We discussed risk of stroke vs. Bleed at length. He understands risk and elects not to restart anticoagulation  Hypertension: -well controlled on metoprolol alone today  Follow up: 6 months or sooner as needed.  Glen Dresser, MD, PhD, [facility]    Medication Adjustments/Labs and Tests Ordered: Current medicines are reviewed at length with the patient today.  Concerns regarding medicines are outlined above.   Orders Placed This Encounter  Procedures   Basic Metabolic Panel (BMET)   CBC   EKG 12-Lead   Meds ordered this encounter  Medications    furosemide (LASIX) 20 MG tablet    Sig: Take 1 tablet (20 mg total) by mouth 2 (two) times daily.    Dispense:  90 tablet    Refill:  3   Patient Instructions  Medication Instructions:  Your physician recommends that you continue on your current medications as directed. Please refer to the Current Medication list given to you today.  *If you need a refill on your cardiac medications before your next appointment, please call your pharmacy*  Lab Work BMET/CBC TODAY  If you have labs (blood work) drawn today and your tests are completely normal, you will receive your results only by: [place] (if you have MyChart) OR A paper copy in the mail If you have any lab test that is abnormal or we need to change your treatment, we will call you to review the results.  Testing/Procedures: NONE  Follow-Up: At [facility], you and your health needs are our priority.  As part of our continuing mission to provide you with exceptional heart care, we have created designated Provider Care Teams.  These Care Teams include your primary Cardiologist (physician) and Advanced Practice Providers (APPs -  Physician Assistants and Nurse Practitioners) who all work together to provide you with the care you need, when you need it.  We recommend signing up for the patient portal called "MyChart".  Sign up information is provided on this After Visit Summary.  MyChart is used to connect with patients for Virtual Visits (Telemedicine).  Patients are able to view lab/test results, encounter notes, upcoming appointments, etc.  Non-urgent messages can be sent to your provider as well.   To learn more about what you can do with MyChart, go to [email or web address].    Your next appointment:   6 month(s)  The format for your next appointment:   In Person  Provider:   Buford Dresser, MD    [place], [phone].   I,Mathew Stumpf,acting as a Education administrator for  [employer], MD.,have documented all relevant documentation on the behalf of Glen Dresser, MD,as directed by  Glen Dresser, MD while in the presence of Glen Dresser, MD.  I,  Glen Dresser, MD, have reviewed all documentation for this visit. The documentation on 10/04/22 for the exam, diagnosis, procedures, and orders are all accurate and complete.   Signed, Glen Dresser, MD PhD 10/04/2022 10:12 AM    [facility]

## 2022-10-04 ENCOUNTER — Encounter (HOSPITAL_BASED_OUTPATIENT_CLINIC_OR_DEPARTMENT_OTHER): Payer: Self-pay | Admitting: Cardiology

## 2022-10-04 ENCOUNTER — Other Ambulatory Visit (HOSPITAL_BASED_OUTPATIENT_CLINIC_OR_DEPARTMENT_OTHER): Payer: Self-pay

## 2022-10-04 ENCOUNTER — Ambulatory Visit (INDEPENDENT_AMBULATORY_CARE_PROVIDER_SITE_OTHER): Payer: Medicare Other | Admitting: Cardiology

## 2022-10-04 VITALS — BP 108/64 | HR 80 | Ht 72.0 in | Wt 206.9 lb

## 2022-10-04 DIAGNOSIS — Z79899 Other long term (current) drug therapy: Secondary | ICD-10-CM

## 2022-10-04 DIAGNOSIS — I4821 Permanent atrial fibrillation: Secondary | ICD-10-CM | POA: Diagnosis not present

## 2022-10-04 DIAGNOSIS — D6869 Other thrombophilia: Secondary | ICD-10-CM

## 2022-10-04 DIAGNOSIS — R600 Localized edema: Secondary | ICD-10-CM | POA: Diagnosis not present

## 2022-10-04 DIAGNOSIS — Z8719 Personal history of other diseases of the digestive system: Secondary | ICD-10-CM | POA: Diagnosis not present

## 2022-10-04 DIAGNOSIS — I10 Essential (primary) hypertension: Secondary | ICD-10-CM | POA: Diagnosis not present

## 2022-10-04 HISTORY — DX: Personal history of other diseases of the digestive system: Z87.19

## 2022-10-04 LAB — BASIC METABOLIC PANEL
BUN/Creatinine Ratio: 25 — ABNORMAL HIGH (ref 10–24)
BUN: 23 mg/dL (ref 10–36)
CO2: 23 mmol/L (ref 20–29)
Calcium: 9 mg/dL (ref 8.6–10.2)
Chloride: 105 mmol/L (ref 96–106)
Creatinine, Ser: 0.91 mg/dL (ref 0.76–1.27)
Glucose: 102 mg/dL — ABNORMAL HIGH (ref 70–99)
Potassium: 4 mmol/L (ref 3.5–5.2)
Sodium: 143 mmol/L (ref 134–144)
eGFR: 79 mL/min/{1.73_m2} (ref >59)

## 2022-10-04 LAB — CBC
Hematocrit: 47.3 % (ref 37.5–51.0)
Hemoglobin: 16.3 g/dL (ref 13.0–17.7)
MCH: 33 pg (ref 26.6–33.0)
MCHC: 34.5 g/dL (ref 31.5–35.7)
MCV: 96 fL (ref 79–97)
Platelets: 209 10*3/uL (ref 150–450)
RBC: 4.94 x10E6/uL (ref 4.14–5.80)
RDW: 11.9 % (ref 11.6–15.4)
WBC: 9.9 10*3/uL (ref 3.4–10.8)

## 2022-10-04 MED ORDER — FUROSEMIDE 20 MG PO TABS
20.0000 mg | ORAL_TABLET | Freq: Two times a day (BID) | ORAL | 3 refills | Status: DC
  Filled 2022-10-04: qty 90, 45d supply, fill #0

## 2022-10-04 NOTE — Patient Instructions (Addendum)
Medication Instructions:  Your physician recommends that you continue on your current medications as directed. Please refer to the Current Medication list given to you today.  *If you need a refill on your cardiac medications before your next appointment, please call your pharmacy*  Lab Work BMET/CBC TODAY  If you have labs (blood work) drawn today and your tests are completely normal, you will receive your results only by: [place] (if you have MyChart) OR A paper copy in the mail If you have any lab test that is abnormal or we need to change your treatment, we will call you to review the results.  Testing/Procedures: NONE  Follow-Up: At [facility], you and your health needs are our priority.  As part of our continuing mission to provide you with exceptional heart care, we have created designated Provider Care Teams.  These Care Teams include your primary Cardiologist (physician) and Advanced Practice Providers (APPs -  Physician Assistants and Nurse Practitioners) who all work together to provide you with the care you need, when you need it.  We recommend signing up for the patient portal called "MyChart".  Sign up information is provided on this After Visit Summary.  MyChart is used to connect with patients for Virtual Visits (Telemedicine).  Patients are able to view lab/test results, encounter notes, upcoming appointments, etc.  Non-urgent messages can be sent to your provider as well.   To learn more about what you can do with MyChart, go to [email or web address].    Your next appointment:   6 month(s)  The format for your next appointment:   In Person  Provider:   Buford Dresser, MD    [place], [phone].

## 2022-10-21 ENCOUNTER — Emergency Department (HOSPITAL_COMMUNITY): Payer: Medicare Other

## 2022-10-21 ENCOUNTER — Emergency Department (HOSPITAL_COMMUNITY)
Admission: EM | Admit: 2022-10-21 | Discharge: 2022-10-21 | Disposition: A | Discharge status: Home / Self Care | Payer: Medicare Other | Attending: Emergency Medicine | Admitting: Emergency Medicine

## 2022-10-21 DIAGNOSIS — R519 Headache, unspecified: Secondary | ICD-10-CM | POA: Diagnosis not present

## 2022-10-21 DIAGNOSIS — W1830XA Fall on same level, unspecified, initial encounter: Secondary | ICD-10-CM | POA: Insufficient documentation

## 2022-10-21 DIAGNOSIS — M25561 Pain in right knee: Secondary | ICD-10-CM | POA: Insufficient documentation

## 2022-10-21 DIAGNOSIS — W19XXXA Unspecified fall, initial encounter: Secondary | ICD-10-CM

## 2022-10-21 DIAGNOSIS — R42 Dizziness and giddiness: Secondary | ICD-10-CM | POA: Diagnosis not present

## 2022-10-21 DIAGNOSIS — M25569 Pain in unspecified knee: Secondary | ICD-10-CM | POA: Diagnosis not present

## 2022-10-21 DIAGNOSIS — I10 Essential (primary) hypertension: Secondary | ICD-10-CM | POA: Diagnosis not present

## 2022-10-21 DIAGNOSIS — S8001XA Contusion of right knee, initial encounter: Secondary | ICD-10-CM | POA: Diagnosis not present

## 2022-10-21 LAB — URINALYSIS, ROUTINE W REFLEX MICROSCOPIC
Bacteria, UA: NONE SEEN
Bilirubin Urine: NEGATIVE
Glucose, UA: NEGATIVE mg/dL
Hgb urine dipstick: NEGATIVE
Ketones, ur: NEGATIVE mg/dL
Nitrite: NEGATIVE
Protein, ur: NEGATIVE mg/dL
Specific Gravity, Urine: 1.019 (ref 1.005–1.030)
pH: 5 (ref 5.0–8.0)

## 2022-10-21 LAB — BASIC METABOLIC PANEL
Anion gap: 6 (ref 5–15)
BUN: 24 mg/dL — ABNORMAL HIGH (ref 8–23)
CO2: 29 mmol/L (ref 22–32)
Calcium: 8.7 mg/dL — ABNORMAL LOW (ref 8.9–10.3)
Chloride: 105 mmol/L (ref 98–111)
Creatinine, Ser: 0.86 mg/dL (ref 0.61–1.24)
GFR, Estimated: >60 mL/min (ref >60)
Glucose, Bld: 96 mg/dL (ref 70–99)
Potassium: 4 mmol/L (ref 3.5–5.1)
Sodium: 140 mmol/L (ref 135–145)

## 2022-10-21 LAB — CBC
HCT: 48 % (ref 39.0–52.0)
Hemoglobin: 16.2 g/dL (ref 13.0–17.0)
MCH: 32.9 pg (ref 26.0–34.0)
MCHC: 33.8 g/dL (ref 30.0–36.0)
MCV: 97.6 fL (ref 80.0–100.0)
Platelets: 181 10*3/uL (ref 150–400)
RBC: 4.92 MIL/uL (ref 4.22–5.81)
RDW: 12.7 % (ref 11.5–15.5)
WBC: 9.5 10*3/uL (ref 4.0–10.5)
nRBC: 0 % (ref 0.0–0.2)

## 2022-10-21 LAB — CBG MONITORING, ED: Glucose-Capillary: 100 mg/dL — ABNORMAL HIGH (ref 70–99)

## 2022-10-21 NOTE — ED Provider Notes (Signed)
[facility] Provider Note   CSN: [id number] Arrival date & time: 10/21/22  6440     History  Chief Complaint  Patient presents with   Fall   Dizziness    Glen [name] is a 89 y.o. male.  89 year old male presents after mechanical fall while at home today.  Patient uses walker normally.  States that he tried to stand up and felt lightheaded and dizzy fell onto his right knee.  Does have history of A-fib and does not take any blood thinners.  Did not strike his head.  No LOC.  Recently had his dose of Lasix increased.  Denies any chest pain or shortness of breath.  No fever or chills.  No nausea or vomiting.  Patient denies any hip pain from the fall.  Notes right knee pain is dull and worse with standing.  Son who is at bedside states that last time patient fell was due to a UTI.  Patient denies any urinary symptoms currently       Home Medications Prior to Admission medications   Medication Sig Start Date End Date Taking? Authorizing Provider  COVID-19 mRNA vaccine [phone] (COMIRNATY) syringe Inject into the muscle. 08/25/22     furosemide (LASIX) 20 MG tablet Take 1 tablet (20 mg total) by mouth 2 (two) times daily. 10/04/22   Buford Dresser, MD  metoprolol tartrate (LOPRESSOR) 100 MG tablet Take 1 tablet by mouth twice daily. 08/28/22   Orma Render, NP  psyllium (METAMUCIL) 58.6 % packet Take 2 packets by mouth daily.    [provider]      Allergies    Protonix [pantoprazole], Codeine, and Ferrous sulfate    Review of Systems   Review of Systems  All other systems reviewed and are negative.   Physical Exam Updated Vital Signs BP 110/75 (BP Location: Right Arm)   Pulse 79   Temp 98.2 F (36.8 C) (Oral)   Resp 14   Ht 1.829 m (6')   Wt 93.8 kg   SpO2 98%   BMI 28.05 kg/m  Physical Exam Vitals and nursing note reviewed.  Constitutional:      General: He is not in acute distress.     Appearance: Normal appearance. He is well-developed. He is not toxic-appearing.  HENT:     Head: Normocephalic and atraumatic.  Eyes:     General: Lids are normal.     Conjunctiva/sclera: Conjunctivae normal.     Pupils: Pupils are equal, round, and reactive to light.  Neck:     Thyroid: No thyroid mass.     Trachea: No tracheal deviation.  Cardiovascular:     Rate and Rhythm: Normal rate and regular rhythm.     Heart sounds: Normal heart sounds. No murmur heard.    No gallop.  Pulmonary:     Effort: Pulmonary effort is normal. No respiratory distress.     Breath sounds: Normal breath sounds. No stridor. No decreased breath sounds, wheezing, rhonchi or rales.  Abdominal:     General: There is no distension.     Palpations: Abdomen is soft.     Tenderness: There is no abdominal tenderness. There is no rebound.  Musculoskeletal:        General: Normal range of motion.     Cervical back: Normal range of motion and neck supple.     Right knee: Normal range of motion. Tenderness present.  Skin:    General: Skin is warm  and dry.     Findings: No abrasion or rash.  Neurological:     General: No focal deficit present.     Mental Status: He is alert and oriented to person, place, and time. Mental status is at baseline.     GCS: GCS eye subscore is 4. GCS verbal subscore is 5. GCS motor subscore is 6.     Cranial Nerves: No cranial nerve deficit.     Sensory: No sensory deficit.     Motor: Motor function is intact.  Psychiatric:        Attention and Perception: Attention normal.        Speech: Speech normal.        Behavior: Behavior normal.     ED Results / Procedures / Treatments   Labs (all labs ordered are listed, but only abnormal results are displayed) Labs Reviewed  CBG MONITORING, ED - Abnormal; Notable for the following components:      Result Value   Glucose-Capillary 100 (*)    All other components within normal limits  BASIC METABOLIC PANEL  CBC  URINALYSIS,  ROUTINE W REFLEX MICROSCOPIC  CBG MONITORING, ED    EKG EKG Interpretation  Date/Time:  Saturday October 21 2022 07:38:27 EST Ventricular Rate:  81 PR Interval:    QRS Duration: 106 QT Interval:  387 QTC Calculation: 450 R Axis:   10 Text Interpretation: Atrial fibrillation Low voltage, precordial leads Abnormal R-wave progression, early transition No significant change since last tracing Confirmed by Lacretia Leigh ([id number]) on 10/21/2022 7:42:24 AM  Radiology No results found.  Procedures Procedures    Medications Ordered in ED Medications - No data to display  ED Course/ Medical Decision Making/ A&P                             Medical Decision Making Amount and/or Complexity of Data Reviewed Labs: ordered. Radiology: ordered.   Patient is EKG per interpretation shows atrial fibrillation which is unchanged from prior.  Patient's urinalysis negative for infection.  He is electrolytes are within normal limits.  No evidence of anemia on his CBC.  No evidence of infectious process.  Patient is neurologically intact at this time.  Do not feel that this represents a central neurological process.  Does not appear to have any peripheral neuropathy.  X-ray of right knee per interpretation showed no acute findings.  Patient is here with family member and they are comfortable with going home        Final Clinical Impression(s) / ED Diagnoses Final diagnoses:  None    Rx / DC Orders ED Discharge Orders     None         Lacretia Leigh, MD 10/21/22 1026

## 2022-10-21 NOTE — ED Triage Notes (Signed)
Pt BIBA from home for fall. Was getting up with his cane when he got dizzy and fell onto right knee. Denies LOC, head trauma, no thinners. Right knee slightly swollen and bruises noted. Small skin tear to right shin, not bleeding. Denies urinary sx, hx afib, took metoprolol this am. Denies recent falls, recent illness, dizziness. Endorses slight intermittent headache.  126/70 HR 80-100

## 2022-10-23 ENCOUNTER — Telehealth: Payer: Self-pay | Admitting: Cardiology

## 2022-10-23 ENCOUNTER — Telehealth: Payer: Self-pay

## 2022-10-23 NOTE — Telephone Encounter (Signed)
  Patient c/o Palpitations:  High priority if patient c/o lightheadedness, shortness of breath, or chest pain  How long have you had palpitations/irregular HR/ Afib? Are you having the symptoms now? No  Are you currently experiencing lightheadedness, SOB or CP? Dizziness yesterday   Do you have a history of afib (atrial fibrillation) or irregular heart rhythm? Yes   Have you checked your BP or HR? (document readings if available): HR average: 98, BP average: 120/80  Are you experiencing any other symptoms? Pt said, he can fel his heart is out of rhythm and this weekend he felt very dizzy and fell. He wet to the ED. Today he said he can feel his heart fluttering. He is requesting to speak with Dr. Judeth Cornfield nurse

## 2022-10-23 NOTE — Telephone Encounter (Signed)
Returned call to patient,   Patient states his heart is acting up this morning,  He fell Saturday morning and was seen in the ED but was discharged shortly after. Yesterday he notes that he was feeling dizzy and lightheaded and can feel his heart racing. Patient states he sent Dr. Harrell Gave a mychart message, but nothing noted in the chart.  Offered patient appointment today with Coletta Memos, NP but patient does not want to go to another office, first opening at [facility] Friday at 8:50 with Dell Ponto, NP. Patient states give me that appointment, but I hope I am still alive by then. Reviewed symptoms in which patient should present to ED for prompt follow up!

## 2022-10-23 NOTE — Patient Outreach (Signed)
  Care Coordination TOC Note Transition Care Management Follow-up Telephone Call Date of discharge and from where: 10/21/22-[facility]  Dx: "fall, contusion right knee" Red on EMMI-ED Discharge Alert Reason: "Scheduled follow-up appt? No" Red Alert Date: 10/21/22 How have you been since you were released from the hospital? Patient states that he is still not feeling much better. He voices that his "head hurt from ear to ear." He also voices that his "heart has been racing off and no." He has already called cardiology office and has appt tomorrow. Patient states he is aware of s/s of worsening condition and when to seek medical attention. He is taking it easy today and has supportive family in the home.  Any questions or concerns? No  Items Reviewed: Did the pt receive and understand the discharge instructions provided? Yes  Medications obtained and verified? Yes  Other? No  Any new allergies since your discharge? No  Dietary orders reviewed? Yes Do you have support at home? Yes   Home Care and Equipment/Supplies: Were home health services ordered? not applicable If so, what is the name of the agency? N/A  Has the agency set up a time to come to the patient's home? not applicable Were any new equipment or medical supplies ordered?  No What is the name of the medical supply agency? N/A Were you able to get the supplies/equipment? not applicable Do you have any questions related to the use of the equipment or supplies? No  Functional Questionnaire: (I = Independent and D = Dependent) ADLs: A  Bathing/Dressing- A  Meal Prep- A  Eating- I  Maintaining continence- I  Transferring/Ambulation- A  Managing Meds- I  Follow up appointments reviewed:  PCP Hospital f/u appt confirmed?  Patient does not have PCP -states he uses cardiologist and not interested in obtaining a PCP  . [facility] f/u appt confirmed? Yes  Scheduled to see Coletta Memos on 1/23/824 @ 1:55pm. Are  transportation arrangements needed? No  If their condition worsens, is the pt aware to call PCP or go to the Emergency Dept.? Yes Was the patient provided with contact information for the PCP's office or ED? Yes Was to pt encouraged to call back with questions or concerns? Yes  SDOH assessments and interventions completed:   Yes SDOH Interventions Today    Flowsheet Row Most Recent Value  SDOH Interventions   Food Insecurity Interventions Intervention Not Indicated  Transportation Interventions Intervention Not Indicated       Care Coordination Interventions:  Education provided    Encounter Outcome:  Pt. Visit Completed    Enzo Montgomery, RN,BSN,CCM [facility] Telephonic Care Management Coordinator Direct Phone: [phone] Toll Free: [phone] Fax: [phone]

## 2022-10-23 NOTE — Progress Notes (Deleted)
Cardiology Clinic Note   Patient Name: ASLAN BURDITT Date of Encounter: 10/23/2022  Primary Care Provider:  Patient, No Pcp Per Primary Cardiologist:  Buford Dresser, MD  Patient Profile    Churchill Gere Justin 89 year old male presents to the clinic today for follow-up evaluation of his atrial fibrillation.  Past Medical History    Past Medical History:  Diagnosis Date   Atrial fibrillation [facility])    Atrial fibrillation, chronic ([place]) 03/13/2008   Qualifier: Diagnosis of  By: Burnice Logan  MD, Doretha Sou    Diverticulitis    Esophageal dysmotility    Esophageal dysphagia 07/29/2018   Family history of diabetes insipidus    FHx: colon cancer    H. pylori infection    Hiatal hernia    History of colonic polyps    Hyperlipidemia    Hypertension    IDA (iron deficiency anemia)    Neoplasm of prostate    special screening malignant    Obesity    Palpitation    Peptic ulcer disease    Presbyesophagus    Schatzki's ring    Past Surgical History:  Procedure Laterality Date   2-D Echocardiogram  November 2009, 2011   Anal Fistula Repair     BALLOON DILATION N/A 01/17/2022   Procedure: [facility];  Surgeon: Jackquline Denmark, MD;  Location: [facility];  Service: Gastroenterology;  Laterality: N/A;   BIOPSY  07/14/2020   Procedure: BIOPSY;  Surgeon: [name], MD;  Location: [facility] ENDOSCOPY;  Service: Gastroenterology;;   BIOPSY  01/17/2022   Procedure: BIOPSY;  Surgeon: Jackquline Denmark, MD;  Location: [facility] ENDOSCOPY;  Service: Gastroenterology;;   CARDIAC VALVE SURGERY  1996   status post balloon valvuloplasty at [place]  2004   COLONOSCOPY  December 2005   ESOPHAGOGASTRODUODENOSCOPY  07/14/2020   ESOPHAGOGASTRODUODENOSCOPY N/A 01/17/2022   Procedure: ESOPHAGOGASTRODUODENOSCOPY (EGD);  Surgeon: Jackquline Denmark, MD;  Location: [facility];  Service: Gastroenterology;  Laterality: N/A;   ESOPHAGOGASTRODUODENOSCOPY (EGD) WITH PROPOFOL N/A 07/14/2020    Procedure: ESOPHAGOGASTRODUODENOSCOPY (EGD) WITH PROPOFOL;  Surgeon: [name], MD;  Location: [facility] ENDOSCOPY;  Service: Gastroenterology;  Laterality: N/A;   ETT  1991   Negative   HEMOSTASIS CLIP PLACEMENT  07/14/2020   HEMOSTASIS CLIP PLACEMENT   HEMOSTASIS CLIP PLACEMENT  07/14/2020   Procedure: HEMOSTASIS CLIP PLACEMENT;  Surgeon: [name], MD;  Location: [facility]OSCOPY;  Service: Gastroenterology;;   KNEE ARTHROSCOPY     Right   TONSILLECTOMY      Allergies  Allergies  Allergen Reactions   Protonix [Pantoprazole] Other (See Comments)    Hallucinations   Codeine Other (See Comments)    constpation   Ferrous Sulfate Itching and Swelling    History of Present Illness    TARELLE YANES is a PMH of permanent atrial fibrillation, hypertension, hyperlipidemia.  He was previously on anticoagulation but is no longer on anticoagulation due to GI bleed.  He was hospitalized 10/21 with GI bleed.  His EKG at that time showed atrial fibrillation with RVR and PVCs.  His heart rate was noted to be 95.  He preferred to keep his metoprolol to tartrate and not transition to metoprolol succinate.  He could not feel his atrial fibrillation.  He was seen in follow-up by Dr. Harrell Gave on 10/04/2022.  During that time he presented with his daughter.  He was taking furosemide once daily.  He asked if he would be able to increase his furosemide to twice  daily due to improvement in his lower extremity swelling.  He did note that at times he would feel woozy.  This was brief and was noted when he would walk from the kitchen to his office.  He denied presyncope and syncope.  He was walking 600-900 yards for exercise.  He was doing this 5-6 times per week.  He denied falls in the past 3 to 4 years.  He did note some difficulty with standing for prolonged periods.  He denied bleeding issues.  He denied palpitations, chest pain, headaches, orthopnea and PND.  His daughter expressed that he was having  worsening short-term memory and intermittent anger/outbursts.  [employer] [facility] care was discussed.  He presents to the clinic today for follow-up evaluation and states***  *** denies chest pain, shortness of breath, lower extremity edema, fatigue, palpitations, melena, hematuria, hemoptysis, diaphoresis, weakness, presyncope, syncope, orthopnea, and PND.  Permanent atrial fibrillation-heart rate today***.  Denies recent episodes of accelerated or irregular heartbeat.  Discontinued anticoagulation due to history of GI bleeds. Continue metoprolol Avoid triggers  Essential hypertension-BP today*** Continue metoprolol, furosemide Heart healthy low-sodium diet-salty 6 given Increase physical activity as tolerated  Lower extremity edema-generalized bilateral lower extremity nonpitting edema.  BNP 10/21/2022 stable. Continue furosemide twice daily Lower extremity support stockings Daily weights  Disposition: Follow-up with Dr. Harrell Gave in 4 to 6 months.  Home Medications    Prior to Admission medications   Medication Sig Start Date End Date Taking? Authorizing Provider  COVID-19 mRNA vaccine [phone] (COMIRNATY) syringe Inject into the muscle. 08/25/22     furosemide (LASIX) 20 MG tablet Take 1 tablet (20 mg total) by mouth 2 (two) times daily. 10/04/22   Buford Dresser, MD  metoprolol tartrate (LOPRESSOR) 100 MG tablet Take 1 tablet by mouth twice daily. 08/28/22   Orma Render, NP  psyllium (METAMUCIL) 58.6 % packet Take 2 packets by mouth daily.    [provider]    Family History    Family History  Problem Relation Age of Onset   Heart attack Father    Prostate cancer Brother    Stroke Brother    Colon cancer Other    Diabetes Other    Heart disease Other    Coronary artery disease Other        Two siblings, status post stenting to siblings with heart disease. One. Status post pacemaker insertion.   Esophageal cancer Neg Hx    He indicated that his  mother is deceased. He indicated that his father is deceased. He indicated that his maternal grandmother is deceased. He indicated that his maternal grandfather is deceased. He indicated that his paternal grandmother is deceased. He indicated that his paternal grandfather is deceased. He indicated that the status of his neg hx is unknown.  Social History    Social History   Socioeconomic History   Marital status: Married    Spouse name: Not on file   Number of children: Not on file   Years of education: Not on file   Highest education level: Not on file  Occupational History   Occupation: Retired    Fish farm manager: RETIRED  Tobacco Use   Smoking status: Never   Smokeless tobacco: Never  Vaping Use   Vaping Use: Never used  Substance and Sexual Activity   Alcohol use: No   Drug use: Never   Sexual activity: Not on file  Other Topics Concern   Not on file  Social History Narrative   Regular exercise: Yes:  [employer] 4 times/week   Social Determinants of Health   Financial Resource Strain: Low Risk  (12/26/2021)   Overall Financial Resource Strain (CARDIA)    Difficulty of Paying Living Expenses: Not hard at all  Food Insecurity: No Food Insecurity (12/26/2021)   Hunger Vital Sign    Worried About Running Out of Food in the Last Year: Never true    Ran Out of Food in the Last Year: Never true  Transportation Needs: No Transportation Needs (12/26/2021)   PRAPARE - Hydrologist (Medical): No    Lack of Transportation (Non-Medical): No  Physical Activity: Insufficiently Active (12/26/2021)   Exercise Vital Sign    Days of Exercise per Week: 5 days    Minutes of Exercise per Session: 10 min  Stress: No Stress Concern Present (12/26/2021)   [place]    Feeling of Stress : Not at all  Social Connections: Moderately Isolated (12/26/2021)   Social Connection and Isolation Panel [NHANES]    Frequency  of Communication with Friends and Family: More than three times a week    Frequency of Social Gatherings with Friends and Family: Once a week    Attends Religious Services: Never    Marine scientist or Organizations: No    Attends Archivist Meetings: Never    Marital Status: Married  Human resources officer Violence: Not At Risk (12/26/2021)   Humiliation, Afraid, Rape, and Kick questionnaire    Fear of Current or Ex-Partner: No    Emotionally Abused: No    Physically Abused: No    Sexually Abused: No     Review of Systems    General:  No chills, fever, night sweats or weight changes.  Cardiovascular:  No chest pain, dyspnea on exertion, edema, orthopnea, palpitations, paroxysmal nocturnal dyspnea. Dermatological: No rash, lesions/masses Respiratory: No cough, dyspnea Urologic: No hematuria, dysuria Abdominal:   No nausea, vomiting, diarrhea, bright red blood per rectum, melena, or hematemesis Neurologic:  No visual changes, wkns, changes in mental status. All other systems reviewed and are otherwise negative except as noted above.  Physical Exam    VS:  There were no vitals taken for this visit. , BMI There is no height or weight on file to calculate BMI. GEN: Well nourished, well developed, in no acute distress. HEENT: normal. Neck: Supple, no JVD, carotid bruits, or masses. Cardiac: RRR, no murmurs, rubs, or gallops. No clubbing, cyanosis, edema.  Radials/DP/PT 2+ and equal bilaterally.  Respiratory:  Respirations regular and unlabored, clear to auscultation bilaterally. GI: Soft, nontender, nondistended, BS + x 4. MS: no deformity or atrophy. Skin: warm and dry, no rash. Neuro:  Strength and sensation are intact. Psych: Normal affect.  Accessory Clinical Findings    Recent Labs: 12/26/2021: TSH 2.83 01/17/2022: Magnesium 1.9 07/11/2022: ALT 19 10/21/2022: BUN 24; Creatinine, Ser 0.86; Hemoglobin 16.2; Platelets 181; Potassium 4.0; Sodium 140   Recent Lipid  Panel    Component Value Date/Time   CHOL 131 07/11/2022 0933   TRIG 101 07/11/2022 0933   TRIG 150 (H) 09/06/2006 1031   HDL 42 07/11/2022 0933   CHOLHDL 3.1 07/11/2022 0933   CHOLHDL 4 12/26/2021 0918   VLDL 21.2 12/26/2021 0918   LDLCALC 70 07/11/2022 0933   LDLDIRECT 126.0 04/19/2015 1000    No BP recorded.  {Refresh Note OR Click here to enter BP  :1}***    ECG personally reviewed by me today- *** -  No acute changes  Echocardiogram 06/01/2021  IMPRESSIONS     1. Hypokinesis of the basal septum. Left ventricular ejection fraction,  by estimation, is 55 to 60%. The left ventricle has normal function. The  left ventricle demonstrates regional wall motion abnormalities (see  scoring diagram/findings for  description). Left ventricular diastolic parameters are indeterminate.   2. Right ventricular systolic function is normal. The right ventricular  size is normal. There is normal pulmonary artery systolic pressure.   3. Left atrial size was severely dilated.   4. The mitral valve is normal in structure. No evidence of mitral valve  regurgitation. No evidence of mitral stenosis.   5. The aortic valve is tricuspid. There is mild calcification of the  aortic valve. There is mild thickening of the aortic valve. Aortic valve  regurgitation is trivial. No aortic stenosis is present.   6. Aortic dilatation noted. There is moderate dilatation of the aortic  root, measuring 47 mm. There is moderate dilatation of the ascending  aorta, measuring 45 mm.   7. The inferior vena cava is normal in size with greater than 50%  respiratory variability, suggesting right atrial pressure of 3 mmHg.   FINDINGS   Left Ventricle: Hypokinesis of the basal septum. Left ventricular  ejection fraction, by estimation, is 55 to 60%. The left ventricle has  normal function. The left ventricle demonstrates regional wall motion  abnormalities. The left ventricular internal  cavity size was normal in  size. There is no left ventricular hypertrophy.  Left ventricular diastolic parameters are indeterminate. Indeterminate  filling pressures.   Right Ventricle: The right ventricular size is normal. No increase in  right ventricular wall thickness. Right ventricular systolic function is  normal. There is normal pulmonary artery systolic pressure. The tricuspid  regurgitant velocity is 2.19 m/s, and   with an assumed right atrial pressure of 3 mmHg, the estimated right  ventricular systolic pressure is [id number] mmHg.   Left Atrium: Left atrial size was severely dilated.   Right Atrium: Right atrial size was normal in size.   Pericardium: There is no evidence of pericardial effusion.   Mitral Valve: The mitral valve is normal in structure. No evidence of  mitral valve regurgitation. No evidence of mitral valve stenosis.   Tricuspid Valve: The tricuspid valve is normal in structure. Tricuspid  valve regurgitation is mild . No evidence of tricuspid stenosis.   Aortic Valve: The aortic valve is tricuspid. There is mild calcification  of the aortic valve. There is mild thickening of the aortic valve. Aortic  valve regurgitation is trivial. No aortic stenosis is present.   Pulmonic Valve: The pulmonic valve was normal in structure. Pulmonic valve  regurgitation is not visualized. No evidence of pulmonic stenosis.   Aorta: Aortic dilatation noted. There is moderate dilatation of the aortic  root, measuring 47 mm. There is moderate dilatation of the ascending  aorta, measuring 45 mm.   Venous: The inferior vena cava is normal in size with greater than 50%  respiratory variability, suggesting right atrial pressure of 3 mmHg.   IAS/Shunts: No atrial level shunt detected by color flow Doppler.    Assessment & Plan   1.  ***   Jossie Ng. Yulonda Wheeling NP-C     10/23/2022, 12:41 PM [facility] [address] Office [phone] Fax [phone]    I  spent***minutes examining this patient, reviewing medications, and using patient centered shared decision making involving her cardiac care.  Prior to  her visit I spent greater than 20 minutes reviewing her past medical history,  medications, and prior cardiac tests.

## 2022-10-24 ENCOUNTER — Ambulatory Visit: Payer: Medicare Other | Attending: Cardiology | Admitting: General Practice

## 2022-10-24 ENCOUNTER — Encounter: Payer: Self-pay | Admitting: General Practice

## 2022-10-24 ENCOUNTER — Ambulatory Visit: Payer: Medicare Other | Admitting: General Practice

## 2022-10-24 VITALS — BP 112/64 | HR 88 | Ht 72.0 in | Wt 208.0 lb

## 2022-10-24 DIAGNOSIS — R600 Localized edema: Secondary | ICD-10-CM | POA: Diagnosis not present

## 2022-10-24 DIAGNOSIS — I10 Essential (primary) hypertension: Secondary | ICD-10-CM | POA: Insufficient documentation

## 2022-10-24 DIAGNOSIS — R531 Weakness: Secondary | ICD-10-CM | POA: Insufficient documentation

## 2022-10-24 DIAGNOSIS — I4821 Permanent atrial fibrillation: Secondary | ICD-10-CM | POA: Insufficient documentation

## 2022-10-24 NOTE — Patient Instructions (Signed)
Medication Instructions:  The current medical regimen is effective;  continue present plan and medications as directed. Please refer to the Current Medication list given to you today.  *If you need a refill on your cardiac medications before your next appointment, please call your pharmacy*  Lab Work: NONE If you have labs (blood work) drawn today and your tests are completely normal, you will receive your results only by:  [facility] (if you have MyChart) OR A paper copy in the mail If you have any lab test that is abnormal or we need to change your treatment, we will call you to review the results.  Other Instructions REFERRAL FOR PHYSICAL THERAPY-[facility]  TAKE AND LOG YOUR WEIGHT DAILY  CONTINUE TO EXERCISE AT HOME AS WELL   Follow-Up: At [facility], you and your health needs are our priority.  As part of our continuing mission to provide you with exceptional heart care, we have created designated Provider Care Teams.  These Care Teams include your primary Cardiologist (physician) and Advanced Practice Providers (APPs -  Physician Assistants and Nurse Practitioners) who all work together to provide you with the care you need, when you need it.  We recommend signing up for the patient portal called "MyChart".  Sign up information is provided on this After Visit Summary.  MyChart is used to connect with patients for Virtual Visits (Telemedicine).  Patients are able to view lab/test results, encounter notes, upcoming appointments, etc.  Non-urgent messages can be sent to your provider as well.   To learn more about what you can do with MyChart, go to [email or web address].    Your next appointment:   6 month(s)  Provider:   Buford Dresser, MD

## 2022-10-24 NOTE — Progress Notes (Signed)
Cardiology Clinic Note   Patient Name: Glen [name] Date of Encounter: 10/24/2022  Primary Care Provider:  Patient, No Pcp Per Primary Cardiologist:  Buford Dresser, MD  Patient Profile    Glen [name] 89 year old male presents to the clinic today for follow-up evaluation of his atrial fibrillation.  Past Medical History    Past Medical History:  Diagnosis Date   Atrial fibrillation [facility])    Atrial fibrillation, chronic ([place]) 03/13/2008   Qualifier: Diagnosis of  By: Burnice Logan  MD, Doretha Sou    Diverticulitis    Esophageal dysmotility    Esophageal dysphagia 07/29/2018   Family history of diabetes insipidus    FHx: colon cancer    H. pylori infection    Hiatal hernia    History of colonic polyps    Hyperlipidemia    Hypertension    IDA (iron deficiency anemia)    Neoplasm of prostate    special screening malignant    Obesity    Palpitation    Peptic ulcer disease    Presbyesophagus    Schatzki's ring    Past Surgical History:  Procedure Laterality Date   2-D Echocardiogram  November 2009, 2011   Anal Fistula Repair     BALLOON DILATION N/A 01/17/2022   Procedure: [place];  Surgeon: Jackquline Denmark, MD;  Location: [facility];  Service: Gastroenterology;  Laterality: N/A;   BIOPSY  07/14/2020   Procedure: BIOPSY;  Surgeon: [name], MD;  Location: [facility] ENDOSCOPY;  Service: Gastroenterology;;   BIOPSY  01/17/2022   Procedure: BIOPSY;  Surgeon: Jackquline Denmark, MD;  Location: [facility] ENDOSCOPY;  Service: Gastroenterology;;   CARDIAC VALVE SURGERY  1996   status post balloon valvuloplasty at [place]  2004   COLONOSCOPY  December 2005   ESOPHAGOGASTRODUODENOSCOPY  07/14/2020   ESOPHAGOGASTRODUODENOSCOPY N/A 01/17/2022   Procedure: ESOPHAGOGASTRODUODENOSCOPY (EGD);  Surgeon: Jackquline Denmark, MD;  Location: [facility];  Service: Gastroenterology;  Laterality: N/A;   ESOPHAGOGASTRODUODENOSCOPY (EGD) WITH PROPOFOL N/A 07/14/2020    Procedure: ESOPHAGOGASTRODUODENOSCOPY (EGD) WITH PROPOFOL;  Surgeon: [name], MD;  Location: [facility] ENDOSCOPY;  Service: Gastroenterology;  Laterality: N/A;   ETT  1991   Negative   HEMOSTASIS CLIP PLACEMENT  07/14/2020   HEMOSTASIS CLIP PLACEMENT   HEMOSTASIS CLIP PLACEMENT  07/14/2020   Procedure: HEMOSTASIS CLIP PLACEMENT;  Surgeon: [name], MD;  Location: [facility]OSCOPY;  Service: Gastroenterology;;   KNEE ARTHROSCOPY     Right   TONSILLECTOMY      Allergies  Allergies  Allergen Reactions   Protonix [Pantoprazole] Other (See Comments)    Hallucinations   Codeine Other (See Comments)    constpation   Ferrous Sulfate Itching and Swelling    History of Present Illness    Glen [name] is a PMH of permanent atrial fibrillation, hypertension, hyperlipidemia.  He was previously on anticoagulation but is no longer on anticoagulation due to GI bleed.  He was hospitalized 10/21 with GI bleed.  His EKG at that time showed atrial fibrillation with RVR and PVCs.  His heart rate was noted to be 95.  He preferred to keep his metoprolol to tartrate and not transition to metoprolol succinate.  He could not feel his atrial fibrillation.  He was seen in follow-up by Dr. Harrell Gave on 10/04/2022.  During that time he presented with his daughter.  He was taking furosemide once daily.  He asked if he would be able to increase his furosemide to twice  daily due to improvement in his lower extremity swelling.  He did note that at times he would feel woozy.  This was brief and was noted when he would walk from the kitchen to his office.  He denied presyncope and syncope.  He was walking 600-900 yards for exercise.  He was doing this 5-6 times per week.  He denied falls in the past 3 to 4 years.  He did note some difficulty with standing for prolonged periods.  He denied bleeding issues.  He denied palpitations, chest pain, headaches, orthopnea and PND.  His daughter expressed that he was having  worsening short-term memory and intermittent anger/outbursts.  [employer] [facility] care was discussed.  He presents to the clinic today for follow-up evaluation and states he had a fall on Saturday while getting out of bed.  We reviewed his emergency department visit and his lab work.  He and his daughter-in-law expressed understanding.  This appears to have been a mechanical fall.  His weight has remained stable.  He is tolerating increase furosemide well.  We reviewed the importance of daily weights and fluid restriction.  I encouraged low-sodium diet.  He is noted to have generalized weakness I will order evaluation and treat/PT.  We will plan follow-up as scheduled in 6 months.  Today he denies chest pain, increased shortness of breath, lower extremity edema, fatigue, palpitations, melena, hematuria, hemoptysis, diaphoresis, weakness, presyncope, syncope, orthopnea, and PND.    Home Medications    Prior to Admission medications   Medication Sig Start Date End Date Taking? Authorizing Provider  COVID-19 mRNA vaccine [phone] (COMIRNATY) syringe Inject into the muscle. 08/25/22     furosemide (LASIX) 20 MG tablet Take 1 tablet (20 mg total) by mouth 2 (two) times daily. 10/04/22   Buford Dresser, MD  metoprolol tartrate (LOPRESSOR) 100 MG tablet Take 1 tablet by mouth twice daily. 08/28/22   Orma Render, NP  psyllium (METAMUCIL) 58.6 % packet Take 2 packets by mouth daily.    [provider]    Family History    Family History  Problem Relation Age of Onset   Heart attack Father    Prostate cancer Brother    Stroke Brother    Colon cancer Other    Diabetes Other    Heart disease Other    Coronary artery disease Other        Two siblings, status post stenting to siblings with heart disease. One. Status post pacemaker insertion.   Esophageal cancer Neg Hx    He indicated that his mother is deceased. He indicated that his father is deceased. He indicated that his maternal  grandmother is deceased. He indicated that his maternal grandfather is deceased. He indicated that his paternal grandmother is deceased. He indicated that his paternal grandfather is deceased. He indicated that the status of his neg hx is unknown.  Social History    Social History   Socioeconomic History   Marital status: Married    Spouse name: Not on file   Number of children: Not on file   Years of education: Not on file   Highest education level: Not on file  Occupational History   Occupation: Retired    Fish farm manager: RETIRED  Tobacco Use   Smoking status: Never   Smokeless tobacco: Never  Vaping Use   Vaping Use: Never used  Substance and Sexual Activity   Alcohol use: No   Drug use: Never   Sexual activity: Not on file  Other Topics  Concern   Not on file  Social History Narrative   Regular exercise: Yes: [employer] 4 times/week   Social Determinants of Health   Financial Resource Strain: Low Risk  (12/26/2021)   Overall Financial Resource Strain (CARDIA)    Difficulty of Paying Living Expenses: Not hard at all  Food Insecurity: No Food Insecurity (10/23/2022)   Hunger Vital Sign    Worried About Running Out of Food in the Last Year: Never true    Ran Out of Food in the Last Year: Never true  Transportation Needs: No Transportation Needs (10/23/2022)   PRAPARE - Hydrologist (Medical): No    Lack of Transportation (Non-Medical): No  Physical Activity: Insufficiently Active (12/26/2021)   Exercise Vital Sign    Days of Exercise per Week: 5 days    Minutes of Exercise per Session: 10 min  Stress: No Stress Concern Present (12/26/2021)   [place]    Feeling of Stress : Not at all  Social Connections: Moderately Isolated (12/26/2021)   Social Connection and Isolation Panel [NHANES]    Frequency of Communication with Friends and Family: More than three times a week    Frequency of  Social Gatherings with Friends and Family: Once a week    Attends Religious Services: Never    Marine scientist or Organizations: No    Attends Archivist Meetings: Never    Marital Status: Married  Human resources officer Violence: Not At Risk (12/26/2021)   Humiliation, Afraid, Rape, and Kick questionnaire    Fear of Current or Ex-Partner: No    Emotionally Abused: No    Physically Abused: No    Sexually Abused: No     Review of Systems    General:  No chills, fever, night sweats or weight changes.  Cardiovascular:  No chest pain, dyspnea on exertion, edema, orthopnea, palpitations, paroxysmal nocturnal dyspnea. Dermatological: No rash, lesions/masses Respiratory: No cough, dyspnea Urologic: No hematuria, dysuria Abdominal:   No nausea, vomiting, diarrhea, bright red blood per rectum, melena, or hematemesis Neurologic:  No visual changes, wkns, changes in mental status. All other systems reviewed and are otherwise negative except as noted above.  Physical Exam    VS:  BP 112/64 (BP Location: Left Arm, Patient Position: Sitting, Cuff Size: Normal)   Pulse 88   Ht 6' (1.829 m)   Wt 208 lb (94.3 kg)   BMI 28.21 kg/m  , BMI Body mass index is 28.21 kg/m. GEN: Well nourished, well developed, in no acute distress. HEENT: normal. Neck: Supple, no JVD, carotid bruits, or masses. Cardiac: Irregularly irregular no murmurs, rubs, or gallops. No clubbing, cyanosis, generalized none pitting bilateral lower extremity edema.  Radials/DP/PT 2+ and equal bilaterally.  Respiratory:  Respirations regular and unlabored, clear to auscultation bilaterally. GI: Soft, nontender, nondistended, BS + x 4. MS: no deformity or atrophy. Skin: warm and dry, no rash. Neuro:  Strength and sensation are intact. Psych: Normal affect.  Accessory Clinical Findings    Recent Labs: 12/26/2021: TSH 2.83 01/17/2022: Magnesium 1.9 07/11/2022: ALT 19 10/21/2022: BUN 24; Creatinine, Ser 0.86; Hemoglobin  16.2; Platelets 181; Potassium 4.0; Sodium 140   Recent Lipid Panel    Component Value Date/Time   CHOL 131 07/11/2022 0933   TRIG 101 07/11/2022 0933   TRIG 150 (H) 09/06/2006 1031   HDL 42 07/11/2022 0933   CHOLHDL 3.1 07/11/2022 0933   CHOLHDL 4 12/26/2021 7824  VLDL 21.2 12/26/2021 0918   LDLCALC 70 07/11/2022 0933   LDLDIRECT 126.0 04/19/2015 1000         ECG personally reviewed by me today-none today.  Echocardiogram 06/01/2021  IMPRESSIONS     1. Hypokinesis of the basal septum. Left ventricular ejection fraction,  by estimation, is 55 to 60%. The left ventricle has normal function. The  left ventricle demonstrates regional wall motion abnormalities (see  scoring diagram/findings for  description). Left ventricular diastolic parameters are indeterminate.   2. Right ventricular systolic function is normal. The right ventricular  size is normal. There is normal pulmonary artery systolic pressure.   3. Left atrial size was severely dilated.   4. The mitral valve is normal in structure. No evidence of mitral valve  regurgitation. No evidence of mitral stenosis.   5. The aortic valve is tricuspid. There is mild calcification of the  aortic valve. There is mild thickening of the aortic valve. Aortic valve  regurgitation is trivial. No aortic stenosis is present.   6. Aortic dilatation noted. There is moderate dilatation of the aortic  root, measuring 47 mm. There is moderate dilatation of the ascending  aorta, measuring 45 mm.   7. The inferior vena cava is normal in size with greater than 50%  respiratory variability, suggesting right atrial pressure of 3 mmHg.   FINDINGS   Left Ventricle: Hypokinesis of the basal septum. Left ventricular  ejection fraction, by estimation, is 55 to 60%. The left ventricle has  normal function. The left ventricle demonstrates regional wall motion  abnormalities. The left ventricular internal  cavity size was normal in size. There is  no left ventricular hypertrophy.  Left ventricular diastolic parameters are indeterminate. Indeterminate  filling pressures.   Right Ventricle: The right ventricular size is normal. No increase in  right ventricular wall thickness. Right ventricular systolic function is  normal. There is normal pulmonary artery systolic pressure. The tricuspid  regurgitant velocity is 2.19 m/s, and   with an assumed right atrial pressure of 3 mmHg, the estimated right  ventricular systolic pressure is 53.6 mmHg.   Left Atrium: Left atrial size was severely dilated.   Right Atrium: Right atrial size was normal in size.   Pericardium: There is no evidence of pericardial effusion.   Mitral Valve: The mitral valve is normal in structure. No evidence of  mitral valve regurgitation. No evidence of mitral valve stenosis.   Tricuspid Valve: The tricuspid valve is normal in structure. Tricuspid  valve regurgitation is mild . No evidence of tricuspid stenosis.   Aortic Valve: The aortic valve is tricuspid. There is mild calcification  of the aortic valve. There is mild thickening of the aortic valve. Aortic  valve regurgitation is trivial. No aortic stenosis is present.   Pulmonic Valve: The pulmonic valve was normal in structure. Pulmonic valve  regurgitation is not visualized. No evidence of pulmonic stenosis.   Aorta: Aortic dilatation noted. There is moderate dilatation of the aortic  root, measuring 47 mm. There is moderate dilatation of the ascending  aorta, measuring 45 mm.   Venous: The inferior vena cava is normal in size with greater than 50%  respiratory variability, suggesting right atrial pressure of 3 mmHg.   IAS/Shunts: No atrial level shunt detected by color flow Doppler.    Assessment & Plan   1.  Permanent atrial fibrillation-heart rate today 88.  Denies recent episodes of accelerated or irregular heartbeat.  Discontinued anticoagulation due to history of GI bleeds. Continue  metoprolol Avoid triggers  Essential hypertension-BP today 112/64 Continue metoprolol, furosemide Heart healthy low-sodium diet-salty 6 given Increase physical activity as tolerated  Lower extremity edema-generalized bilateral lower extremity nonpitting edema.  BNP 10/21/2022 stable. Continue furosemide twice daily Lower extremity support stockings Daily weights  Generalized weakness- Upper and lower extremity. Somewhat active at home.  PT for eval and treat   Disposition: Follow-up with Dr. Harrell Gave in 4 to 6 months.   Jossie Ng. Rubie Ficco NP-C     10/24/2022, 2:31 PM [place] [address] Office [phone] Fax [phone]    I spent 14 minutes examining this patient, reviewing medications, and using patient centered shared decision making involving her cardiac care.  Prior to her visit I spent greater than 20 minutes reviewing her past medical history,  medications, and prior cardiac tests.

## 2022-10-25 ENCOUNTER — Telehealth: Payer: Self-pay

## 2022-10-25 NOTE — Telephone Encounter (Signed)
     Patient  visit on 1/20  at [facility] you been able to follow up with your primary care physician? Yes   The patient was or was not able to obtain any needed medicine or equipment. Yes   Are there diet recommendations that you are having difficulty following? Na   Patient expresses understanding of discharge instructions and education provided has no other needs at this time. Yes      [place], [facility], Care Management  [phone] [address], [place], [place] [address] Phone: [phone] Email: [email or web address]

## 2022-10-27 ENCOUNTER — Ambulatory Visit (HOSPITAL_BASED_OUTPATIENT_CLINIC_OR_DEPARTMENT_OTHER): Payer: Medicare Other | Admitting: Family

## 2022-11-03 ENCOUNTER — Ambulatory Visit (HOSPITAL_BASED_OUTPATIENT_CLINIC_OR_DEPARTMENT_OTHER): Payer: Medicare Other | Admitting: Cardiology

## 2022-11-08 ENCOUNTER — Encounter: Payer: Self-pay | Admitting: Adult Health

## 2022-11-08 ENCOUNTER — Other Ambulatory Visit (HOSPITAL_BASED_OUTPATIENT_CLINIC_OR_DEPARTMENT_OTHER): Payer: Self-pay

## 2022-11-08 ENCOUNTER — Ambulatory Visit (INDEPENDENT_AMBULATORY_CARE_PROVIDER_SITE_OTHER): Payer: Medicare Other | Admitting: Adult Health

## 2022-11-08 VITALS — BP 124/78 | HR 87 | Temp 96.9°F | Resp 20 | Ht 72.0 in | Wt 209.2 lb

## 2022-11-08 DIAGNOSIS — B9689 Other specified bacterial agents as the cause of diseases classified elsewhere: Secondary | ICD-10-CM | POA: Diagnosis not present

## 2022-11-08 DIAGNOSIS — H6693 Otitis media, unspecified, bilateral: Secondary | ICD-10-CM | POA: Diagnosis not present

## 2022-11-08 MED ORDER — AMOXICILLIN 500 MG PO CAPS
500.0000 mg | ORAL_CAPSULE | Freq: Two times a day (BID) | ORAL | 0 refills | Status: DC
  Filled 2022-11-08: qty 20, 10d supply, fill #0

## 2022-11-08 NOTE — Patient Instructions (Signed)
Begin amoxicillin 500 mg twice daily for 10 days

## 2022-11-08 NOTE — Progress Notes (Signed)
Location:  [facility]   Place of Service:   [facility]   CODE STATUS: full   Allergies  Allergen Reactions   Protonix [Pantoprazole] Other (See Comments)    Hallucinations   Codeine Other (See Comments)    constpation   Ferrous Sulfate Itching and Swelling    Chief Complaint  Patient presents with   Acute Visit    Patient presents today for a post fall.    HPI:  He had a fall in Jan of this year; at that time there were no acute problems noted. He has continued to have frontal headaches and dizziness. He has not had any further falls. He has not had any fevers present. He is taking his medications as directed.   Past Medical History:  Diagnosis Date   Atrial fibrillation [facility])    Atrial fibrillation, chronic ([place]) 03/13/2008   Qualifier: Diagnosis of  By: Burnice Logan  MD, Doretha Sou    Diverticulitis    Esophageal dysmotility    Esophageal dysphagia 07/29/2018   Family history of diabetes insipidus    FHx: colon cancer    H. pylori infection    Hiatal hernia    History of colonic polyps    Hyperlipidemia    Hypertension    IDA (iron deficiency anemia)    Neoplasm of prostate    special screening malignant    Obesity    Palpitation    Peptic ulcer disease    Presbyesophagus    Schatzki's ring     Past Surgical History:  Procedure Laterality Date   2-D Echocardiogram  November 2009, 2011   Anal Fistula Repair     BALLOON DILATION N/A 01/17/2022   Procedure: Stacie Acres;  Surgeon: Jackquline Denmark, MD;  Location: [facility]OSCOPY;  Service: Gastroenterology;  Laterality: N/A;   BIOPSY  07/14/2020   Procedure: BIOPSY;  Surgeon: [name], MD;  Location: [facility] ENDOSCOPY;  Service: Gastroenterology;;   BIOPSY  01/17/2022   Procedure: BIOPSY;  Surgeon: Jackquline Denmark, MD;  Location: [facility] ENDOSCOPY;  Service: Gastroenterology;;   CARDIAC VALVE SURGERY  1996   status post balloon valvuloplasty at [place]  2004   COLONOSCOPY  December 2005    ESOPHAGOGASTRODUODENOSCOPY  07/14/2020   ESOPHAGOGASTRODUODENOSCOPY N/A 01/17/2022   Procedure: ESOPHAGOGASTRODUODENOSCOPY (EGD);  Surgeon: Jackquline Denmark, MD;  Location: [facility];  Service: Gastroenterology;  Laterality: N/A;   ESOPHAGOGASTRODUODENOSCOPY (EGD) WITH PROPOFOL N/A 07/14/2020   Procedure: ESOPHAGOGASTRODUODENOSCOPY (EGD) WITH PROPOFOL;  Surgeon: [name], MD;  Location: [facility] ENDOSCOPY;  Service: Gastroenterology;  Laterality: N/A;   ETT  1991   Negative   HEMOSTASIS CLIP PLACEMENT  07/14/2020   HEMOSTASIS CLIP PLACEMENT   HEMOSTASIS CLIP PLACEMENT  07/14/2020   Procedure: HEMOSTASIS CLIP PLACEMENT;  Surgeon: [name], MD;  Location: [facility] ENDOSCOPY;  Service: Gastroenterology;;   KNEE ARTHROSCOPY     Right   TONSILLECTOMY      Social History   Socioeconomic History   Marital status: Married    Spouse name: Not on file   Number of children: Not on file   Years of education: Not on file   Highest education level: Not on file  Occupational History   Occupation: Retired    Fish farm manager: RETIRED  Tobacco Use   Smoking status: Never   Smokeless tobacco: Never  Vaping Use   Vaping Use: Never used  Substance and Sexual Activity   Alcohol use: Never   Drug use: Never   Sexual  activity: Not on file  Other Topics Concern   Not on file  Social History Narrative   Regular exercise: Yes: [employer] 4 times/week   Social Determinants of Health   Financial Resource Strain: Low Risk  (12/26/2021)   Overall Financial Resource Strain (CARDIA)    Difficulty of Paying Living Expenses: Not hard at all  Food Insecurity: No Food Insecurity (10/23/2022)   Hunger Vital Sign    Worried About Running Out of Food in the Last Year: Never true    Ran Out of Food in the Last Year: Never true  Transportation Needs: No Transportation Needs (10/23/2022)   PRAPARE - Hydrologist (Medical): No    Lack of Transportation (Non-Medical): No  Physical Activity:  Insufficiently Active (12/26/2021)   Exercise Vital Sign    Days of Exercise per Week: 5 days    Minutes of Exercise per Session: 10 min  Stress: No Stress Concern Present (12/26/2021)   [place]    Feeling of Stress : Not at all  Social Connections: Moderately Isolated (12/26/2021)   Social Connection and Isolation Panel [NHANES]    Frequency of Communication with Friends and Family: More than three times a week    Frequency of Social Gatherings with Friends and Family: Once a week    Attends Religious Services: Never    Marine scientist or Organizations: No    Attends Archivist Meetings: Never    Marital Status: Married  Human resources officer Violence: Not At Risk (12/26/2021)   Humiliation, Afraid, Rape, and Kick questionnaire    Fear of Current or Ex-Partner: No    Emotionally Abused: No    Physically Abused: No    Sexually Abused: No   Family History  Problem Relation Age of Onset   Cancer Mother    Heart Problems Father    Stroke Father    Heart attack Father    Breast cancer Sister    Stroke Brother    Heart Problems Brother    Esophageal cancer Neg Hx       VITAL SIGNS BP 124/78   Pulse 87   Temp (!) 96.9 F (36.1 C)   Resp 20   Ht 6' (1.829 m)   Wt 209 lb 3.2 oz (94.9 kg)   SpO2 96%   BMI 28.37 kg/m   Outpatient Encounter Medications as of 11/08/2022  Medication Sig   amoxicillin (AMOXIL) 500 MG capsule Take 1 capsule (500 mg total) by mouth 2 (two) times daily.   COVID-19 mRNA vaccine 2023-2024 (COMIRNATY) syringe Inject into the muscle.   furosemide (LASIX) 20 MG tablet Take 1 tablet (20 mg total) by mouth 2 (two) times daily.   metoprolol tartrate (LOPRESSOR) 100 MG tablet Take 1 tablet by mouth twice daily.   psyllium (METAMUCIL) 58.6 % packet Take 2 packets by mouth daily.   No facility-administered encounter medications on file as of 11/08/2022.     SIGNIFICANT DIAGNOSTIC  EXAMS  Review of Systems  Constitutional:  Negative for malaise/fatigue and weight loss.  HENT:  Positive for hearing loss.   Respiratory:  Negative for cough and shortness of breath.   Cardiovascular:  Negative for chest pain, palpitations and leg swelling.  Gastrointestinal:  Negative for abdominal pain, constipation and heartburn.  Musculoskeletal:  Negative for back pain, joint pain and myalgias.  Skin: Negative.   Neurological:  Positive for dizziness.       Fall  on 10-21-22  Psychiatric/Behavioral:  The patient is not nervous/anxious.      Physical Exam Constitutional:      General: He is not in acute distress.    Appearance: He is not diaphoretic.  HENT:     Ears:     Comments: Both ear drums is red and inflamed slight bulging present  Eyes:     Conjunctiva/sclera: Conjunctivae normal.  Neck:     Thyroid: No thyromegaly.     Vascular: No JVD.  Cardiovascular:     Rate and Rhythm: Normal rate and regular rhythm.     Pulses: Normal pulses.  Pulmonary:     Effort: Pulmonary effort is normal. No respiratory distress.     Breath sounds: Normal breath sounds. No wheezing.  Abdominal:     General: Bowel sounds are normal. There is no distension.     Palpations: Abdomen is soft.     Tenderness: There is no abdominal tenderness.  Musculoskeletal:        General: Normal range of motion.     Cervical back: Neck supple.     Right lower leg: Edema present.     Left lower leg: Edema present.     Comments: Uses rollator   Lymphadenopathy:     Cervical: No cervical adenopathy.  Skin:    General: Skin is warm and dry.  Neurological:     General: No focal deficit present.     Mental Status: He is alert and oriented to person, place, and time.     Comments: Has stuttering speech   Psychiatric:        Mood and Affect: Mood normal.       ASSESSMENT/ PLAN:   TODAY   Bilateral  ear infection; will begin amoxicillin 500 mg twice daily for 10 days To return to clinic for  next follow up appointment.  He is call if he is not better in the 5-7 days  Ok Edwards NP [facility] Adult Medicine  call [phone]

## 2022-11-17 ENCOUNTER — Ambulatory Visit (HOSPITAL_BASED_OUTPATIENT_CLINIC_OR_DEPARTMENT_OTHER): Payer: Medicare Other | Admitting: Physical Therapy

## 2022-11-27 ENCOUNTER — Other Ambulatory Visit (HOSPITAL_BASED_OUTPATIENT_CLINIC_OR_DEPARTMENT_OTHER): Payer: Self-pay

## 2022-11-27 ENCOUNTER — Ambulatory Visit (INDEPENDENT_AMBULATORY_CARE_PROVIDER_SITE_OTHER): Payer: Medicare Other | Admitting: Cardiology

## 2022-11-27 ENCOUNTER — Encounter (HOSPITAL_BASED_OUTPATIENT_CLINIC_OR_DEPARTMENT_OTHER): Payer: Self-pay | Admitting: Cardiology

## 2022-11-27 VITALS — BP 110/68 | HR 86 | Ht 72.0 in | Wt 209.9 lb

## 2022-11-27 DIAGNOSIS — I10 Essential (primary) hypertension: Secondary | ICD-10-CM

## 2022-11-27 DIAGNOSIS — R600 Localized edema: Secondary | ICD-10-CM

## 2022-11-27 DIAGNOSIS — D6869 Other thrombophilia: Secondary | ICD-10-CM | POA: Diagnosis not present

## 2022-11-27 DIAGNOSIS — I4821 Permanent atrial fibrillation: Secondary | ICD-10-CM

## 2022-11-27 DIAGNOSIS — Z8719 Personal history of other diseases of the digestive system: Secondary | ICD-10-CM

## 2022-11-27 DIAGNOSIS — R5381 Other malaise: Secondary | ICD-10-CM

## 2022-11-27 MED ORDER — FUROSEMIDE 20 MG PO TABS
20.0000 mg | ORAL_TABLET | Freq: Two times a day (BID) | ORAL | 3 refills | Status: DC
  Filled 2022-11-27: qty 180, 90d supply, fill #0

## 2022-11-27 MED ORDER — METOPROLOL TARTRATE 100 MG PO TABS
ORAL_TABLET | ORAL | 3 refills | Status: DC
  Filled 2022-11-27: qty 180, 90d supply, fill #0

## 2022-11-27 NOTE — Progress Notes (Signed)
Cardiology Office Note:    Date:  11/27/2022   ID:  Glen [name], DOB 1928-09-24, MRN [id number]  PCP:  Patient, No Pcp Per  Cardiologist:  Buford Dresser, MD   CC: follow-up  History of Present Illness:    Glen [name] is a 89 y.o. male with a hx of hypertension, hyperlidemia and permanent AF, previously on coumadin but now on no anticoagulation who is seen for follow up.   Pertinent history: 07/2020 hospitalization for GI bleed. ECG 07/14/20 afib RVR with PVC. Only cardiac medication at that visit was metoprolol, and heart rate was 95 bpm. I discussed changing to extended release metoprolol but he declined and would prefer to keep tartrate, as he said this was what Dr. Claiborne Billings had recommended to him. He was difficult to keep on task during the interview. He had chronic LE edema, which he endorsed is from the metoprolol. We discussed chronic venous insufficiency in depth. Could not feel his afib.  He had been admitted to the hospital 08/09/2020 with nausea and black tarry stools. He was discharged on omeprazole 40 mg twice daily. He was last seen in cardiology by Dr. Claiborne Billings 08/08/2021 where he reported  a bleeding ulcer, and a typical heart rate in the 90s. We have discussed anticoagulation at length. I recommended apixaban over coumadin, but he was concerned about potential for recurrent GI bleed. We discussed risk of stroke vs. Bleed at length. He understood the risk and elected not to restart anticoagulation.  He presented to the ED 10/21/2022. He had fallen onto his right knee after standing up and becoming lightheaded/dizzy. Previous fall was noted to be in the setting of UTI; urinalysis was negative for infection. No acute findings in X-ray of right knee.   He followed up with Coletta Memos, NP  10/24/2022. He complained of upper and lower extremity generalized weakness. He was referred to physical therapy for evaluation and treatment.  Today, he is accompanied by a family member.  He states that his heart is great. His blood pressures and heart rates have been stable at home. However he did have a fall last month onto the floor and bruised his right knee. EMS was called.  After getting up and walking a bit he sometimes feels lightheaded. Sometimes this occurs after eating and walking to another room. After getting up in the night to use the restroom and walking back, he has noticed himself breathing heavier. He is using a rollator.  For exercise he walks along a portion of his long driveway. If he is in the house, he will periodically stand up and kick his legs, raise his arms, etc.  Recently he had an ear infection and impacted cerumen. This was treated with antibiotics and drops. He has been feeling better but notes a side effect of LE edema to the top of his feet. Currently he is taking Lasix once a day.  He takes 3 capsules of metamucil BID which works well for him.  He denies any palpitations, chest pain, headaches, syncope, orthopnea, or PND.   Past Medical History:  Diagnosis Date   Atrial fibrillation [facility])    Atrial fibrillation, chronic ([place]) 03/13/2008   Qualifier: Diagnosis of  By: Burnice Logan  MD, Doretha Sou    Diverticulitis    Esophageal dysmotility    Esophageal dysphagia 07/29/2018   Family history of diabetes insipidus    FHx: colon cancer    H. pylori infection    Hiatal hernia    History of  colonic polyps    Hyperlipidemia    Hypertension    IDA (iron deficiency anemia)    Neoplasm of prostate    special screening malignant    Obesity    Palpitation    Peptic ulcer disease    Presbyesophagus    Schatzki's ring     Past Surgical History:  Procedure Laterality Date   2-D Echocardiogram  November 2009, 2011   Anal Fistula Repair     BALLOON DILATION N/A 01/17/2022   Procedure: Larrie Kass DILATION;  Surgeon: Jackquline Denmark, MD;  Location: [facility] ENDOSCOPY;  Service: Gastroenterology;  Laterality: N/A;   BIOPSY  07/14/2020   Procedure: BIOPSY;   Surgeon: [name], MD;  Location: [facility] ENDOSCOPY;  Service: Gastroenterology;;   BIOPSY  01/17/2022   Procedure: BIOPSY;  Surgeon: Jackquline Denmark, MD;  Location: [facility] ENDOSCOPY;  Service: Gastroenterology;;   CARDIAC VALVE SURGERY  1996   status post balloon valvuloplasty at [place]  2004   COLONOSCOPY  December 2005   ESOPHAGOGASTRODUODENOSCOPY  07/14/2020   ESOPHAGOGASTRODUODENOSCOPY N/A 01/17/2022   Procedure: ESOPHAGOGASTRODUODENOSCOPY (EGD);  Surgeon: Jackquline Denmark, MD;  Location: [facility] ENDOSCOPY;  Service: Gastroenterology;  Laterality: N/A;   ESOPHAGOGASTRODUODENOSCOPY (EGD) WITH PROPOFOL N/A 07/14/2020   Procedure: ESOPHAGOGASTRODUODENOSCOPY (EGD) WITH PROPOFOL;  Surgeon: [name], MD;  Location: [facility] ENDOSCOPY;  Service: Gastroenterology;  Laterality: N/A;   ETT  1991   Negative   HEMOSTASIS CLIP PLACEMENT  07/14/2020   HEMOSTASIS CLIP PLACEMENT   HEMOSTASIS CLIP PLACEMENT  07/14/2020   Procedure: HEMOSTASIS CLIP PLACEMENT;  Surgeon: [name], MD;  Location: [facility] ENDOSCOPY;  Service: Gastroenterology;;   KNEE ARTHROSCOPY     Right   TONSILLECTOMY      Current Medications: Current Outpatient Medications on File Prior to Visit  Medication Sig   COVID-19 mRNA vaccine 2023-2024 (COMIRNATY) syringe Inject into the muscle.   No current facility-administered medications on file prior to visit.   actually taking 75 mg BID metoprolol  Allergies:   Protonix [pantoprazole], Codeine, and Ferrous sulfate   Social History   Tobacco Use   Smoking status: Never   Smokeless tobacco: Never  Vaping Use   Vaping Use: Never used  Substance Use Topics   Alcohol use: Never   Drug use: Never    Family History: The patient's family history includes Breast cancer in his sister; Cancer in his mother; Heart Problems in his brother and father; Heart attack in his father; Stroke in his brother and father. There is no history of Esophageal cancer. His father had a faulty  heart valve and died at 87 yo due to a fall.   ROS:   Please see the history of present illness.   (+) Lightheadedness (+) Shortness of breath (+) LE edema Additional pertinent ROS otherwise unremarkable.  EKGs/Labs/Other Studies Reviewed:    The following studies were reviewed today:  Echo 06/01/2021:  1. Hypokinesis of the basal septum. Left ventricular ejection fraction,  by estimation, is 55 to 60%. The left ventricle has normal function. The  left ventricle demonstrates regional wall motion abnormalities (see  scoring diagram/findings for  description). Left ventricular diastolic parameters are indeterminate.   2. Right ventricular systolic function is normal. The right ventricular  size is normal. There is normal pulmonary artery systolic pressure.   3. Left atrial size was severely dilated.   4. The mitral valve is normal in structure. No evidence of mitral valve  regurgitation. No evidence of mitral stenosis.  5. The aortic valve is tricuspid. There is mild calcification of the  aortic valve. There is mild thickening of the aortic valve. Aortic valve  regurgitation is trivial. No aortic stenosis is present.   6. Aortic dilatation noted. There is moderate dilatation of the aortic  root, measuring 47 mm. There is moderate dilatation of the ascending  aorta, measuring 45 mm.   7. The inferior vena cava is normal in size with greater than 50%  respiratory variability, suggesting right atrial pressure of 3 mmHg.   Echo from 2016: Left ventricle: The cavity size was normal. There was mild   concentric hypertrophy. Systolic function was normal. The   estimated ejection fraction was in the range of 55% to 60%. Wall   motion was normal; there were no regional wall motion   abnormalities. - Aortic valve: There was trivial regurgitation. - Left atrium: The atrium was moderately dilated.  EKG:  ECG is personally reviewed.  11/27/2022:  EKG was not ordered. 10/04/2022:  atrial  fibrillation at 80 bpm 08/08/2021 (Dr. Claiborne Billings): Atrial fibrillation at 96 bpm. 07/20/2020: atrial fibrillation at a rate of 95 bpm.  Recent Labs: 12/26/2021: TSH 2.83 01/17/2022: Magnesium 1.9 07/11/2022: ALT 19 10/21/2022: BUN 24; Creatinine, Ser 0.86; Hemoglobin 16.2; Platelets 181; Potassium 4.0; Sodium 140   Recent Lipid Panel    Component Value Date/Time   CHOL 131 07/11/2022 0933   TRIG 101 07/11/2022 0933   TRIG 150 (H) 09/06/2006 1031   HDL 42 07/11/2022 0933   CHOLHDL 3.1 07/11/2022 0933   CHOLHDL 4 12/26/2021 0918   VLDL 21.2 12/26/2021 0918   LDLCALC 70 07/11/2022 0933   LDLDIRECT 126.0 04/19/2015 1000    Physical Exam:    VS:  BP 110/68 (BP Location: Left Arm, Patient Position: Sitting, Cuff Size: Normal)   Pulse 86   Ht 6' (1.829 m)   Wt 209 lb 14.4 oz (95.2 kg)   SpO2 97%   BMI 28.47 kg/m     Wt Readings from Last 3 Encounters:  11/27/22 209 lb 14.4 oz (95.2 kg)  11/08/22 209 lb 3.2 oz (94.9 kg)  10/24/22 208 lb (94.3 kg)    GEN: Well nourished, well developed in no acute distress HEENT: Normal, moist mucous membranes NECK: No JVD CARDIAC: irregularly irregular rhythm, normal S1 and S2, no rubs or gallops. No murmur. VASCULAR: Radial pulses 2+ bilaterally. No carotid bruits RESPIRATORY:  Clear to auscultation without rales, wheezing or rhonchi  ABDOMEN: Soft, non-tender, non-distended MUSCULOSKELETAL:  Moves all 4 limbs independently, ambulates with rollator SKIN: Warm and dry. 1+ bilateral nonpitting edema NEUROLOGIC:  Alert and oriented x 3. No focal neuro deficits noted. PSYCHIATRIC:  Normal affect   ASSESSMENT:    1. Permanent atrial fibrillation ([place])   2. Essential hypertension   3. Secondary hypercoagulable state ([place])   4. Bilateral leg edema   5. History of GI bleed   6. Physical deconditioning     PLAN:    LE edema: -most suggestive of chronic venous insufficiency, though may also have contribution from permanent afib -continue BID  lasix -recommend compression stockings, elevation  History of food impaction/dysphagia, s/p esophageal dilation History of GI bleeding -see prior notes. He denies bleeding today, but declines anticoagulation, see below  Atrial fibrillation, permanent -H/H has been stable, no recent bleeding This patients CHA2DS2-VASc Score and unadjusted Ischemic Stroke Rate (% per year) is equal to 4.8 % stroke rate/year from a score of 4 -rate controlled with metoprolol tartrate 100 mg BID -  we have discussed anticoagulation at length. He has spent time doing research and does not want anticoagulation at this time. I recommended apixaban over coumadin, but he is concerned about potential for recurrent GI bleed. We discussed risk of stroke vs. Bleed at length. He understands risk and elects not to restart anticoagulation  Hypertension: -well controlled on metoprolol and lasix today  Follow up: 6 months or sooner as needed.  Buford Dresser, MD, PhD, [facility] [place] [facility] [address] [place], [place] [address] [phone]    Medication Adjustments/Labs and Tests Ordered: Current medicines are reviewed at length with the patient today.  Concerns regarding medicines are outlined above.   No orders of the defined types were placed in this encounter.  Meds ordered this encounter  Medications   furosemide (LASIX) 20 MG tablet    Sig: Take 1 tablet (20 mg total) by mouth 2 (two) times daily.    Dispense:  180 tablet    Refill:  3   metoprolol tartrate (LOPRESSOR) 100 MG tablet    Sig: Take 1 tablet by mouth twice daily.    Dispense:  180 tablet    Refill:  3   Patient Instructions  Medication Instructions:  Your physician recommends that you continue on your current medications as directed. Please refer to the Current Medication list given to you today.  *If you need a refill on  your cardiac medications before your next appointment, please call your pharmacy*  Lab Work: NONE  Testing/Procedures: NONE  Follow-Up: At [facility], you and your health needs are our priority.  As part of our continuing mission to provide you with exceptional heart care, we have created designated Provider Care Teams.  These Care Teams include your primary Cardiologist (physician) and Advanced Practice Providers (APPs -  Physician Assistants and Nurse Practitioners) who all work together to provide you with the care you need, when you need it.  We recommend signing up for the patient portal called "MyChart".  Sign up information is provided on this After Visit Summary.  MyChart is used to connect with patients for Virtual Visits (Telemedicine).  Patients are able to view lab/test results, encounter notes, upcoming appointments, etc.  Non-urgent messages can be sent to your provider as well.   To learn more about what you can do with MyChart, go to [email or web address].    Your next appointment:   6 month(s)  The format for your next appointment:   In Person  Provider:   Buford Dresser, MD       [facility] Stump[place],acting as a scribe for Buford Dresser, MD.,have documented all relevant documentation on the behalf of Buford Dresser, MD,as directed by  Buford Dresser, MD while in the presence of Buford Dresser, MD.  I, Buford Dresser, MD, have reviewed all documentation for this visit. The documentation on 11/27/22 for the exam, diagnosis, procedures, and orders are all accurate and complete.   Signed, Buford Dresser, MD PhD 11/27/2022 10:33 AM    [place]

## 2022-11-27 NOTE — Patient Instructions (Signed)
Medication Instructions:  Your physician recommends that you continue on your current medications as directed. Please refer to the Current Medication list given to you today.   *If you need a refill on your cardiac medications before your next appointment, please call your pharmacy*  Lab Work: NONE  Testing/Procedures: NONE   Follow-Up: At [facility], you and your health needs are our priority.  As part of our continuing mission to provide you with exceptional heart care, we have created designated Provider Care Teams.  These Care Teams include your primary Cardiologist (physician) and Advanced Practice Providers (APPs -  Physician Assistants and Nurse Practitioners) who all work together to provide you with the care you need, when you need it.  We recommend signing up for the patient portal called "MyChart".  Sign up information is provided on this After Visit Summary.  MyChart is used to connect with patients for Virtual Visits (Telemedicine).  Patients are able to view lab/test results, encounter notes, upcoming appointments, etc.  Non-urgent messages can be sent to your provider as well.   To learn more about what you can do with MyChart, go to [email or web address].    Your next appointment:   6 month(s)  The format for your next appointment:   In Person  Provider:   Bridgette Christopher, MD             

## 2022-11-30 ENCOUNTER — Other Ambulatory Visit: Payer: Self-pay

## 2022-12-01 ENCOUNTER — Encounter: Payer: Self-pay | Admitting: Nurse Practitioner

## 2022-12-01 ENCOUNTER — Ambulatory Visit (INDEPENDENT_AMBULATORY_CARE_PROVIDER_SITE_OTHER): Payer: Medicare Other | Admitting: Nurse Practitioner

## 2022-12-01 ENCOUNTER — Other Ambulatory Visit (HOSPITAL_BASED_OUTPATIENT_CLINIC_OR_DEPARTMENT_OTHER): Payer: Self-pay

## 2022-12-01 VITALS — BP 118/80 | HR 98 | Temp 97.1°F | Ht 71.03 in | Wt 209.0 lb

## 2022-12-01 DIAGNOSIS — R600 Localized edema: Secondary | ICD-10-CM

## 2022-12-01 DIAGNOSIS — R413 Other amnesia: Secondary | ICD-10-CM | POA: Diagnosis not present

## 2022-12-01 DIAGNOSIS — H6121 Impacted cerumen, right ear: Secondary | ICD-10-CM

## 2022-12-01 DIAGNOSIS — H9113 Presbycusis, bilateral: Secondary | ICD-10-CM

## 2022-12-01 DIAGNOSIS — I4821 Permanent atrial fibrillation: Secondary | ICD-10-CM

## 2022-12-01 DIAGNOSIS — G319 Degenerative disease of nervous system, unspecified: Secondary | ICD-10-CM

## 2022-12-01 DIAGNOSIS — H9193 Unspecified hearing loss, bilateral: Secondary | ICD-10-CM

## 2022-12-01 NOTE — Progress Notes (Unsigned)
Careteam: Patient Care Team: Lauree Chandler, NP as PCP - General (Geriatric Medicine) Buford Dresser, MD as PCP - Cardiology (Cardiology) Sharyon Medicus, MD as Rounding Team (Internal Medicine)  PLACE OF SERVICE:  [place] Directive information Does Patient Have a Medical Advance Directive?: Yes, Type of Advance Directive: [place];Living will, Does patient want to make changes to medical advance directive?: No - Patient declined  Allergies  Allergen Reactions   Protonix [Pantoprazole] Other (See Comments)    Hallucinations   Codeine Other (See Comments)    constpation   Ferrous Sulfate Itching and Swelling    Chief Complaint  Patient presents with   Establish Care    New patient to establish care. New patient packet was not provided prior to appointment. Medication bottles not present at initial appointment. NCIR verified. Patient denies depression, however the lady with him hinted that he may be depressed. Here with daughter Clayborne Dana (daughter in law). Patient c/o dizziness (feels like he is going to pass out) and leg concerns. Clayborne Dana, gave a motion that memory is a concern. Patient lives with his spouse.      HPI: Patient is a 89 y.o. male to establish care.  He previously doctor moved and cardiologist recommended our practice.   Last AWV was 12/26/21  Afib/htn- on metoprolol 100 mg twice daily  He is not on anticoagulant due to risk of bleeding. He has had a bleeding ulcer in the past and does not even want to take an ASA  LE edema- on lasix 20 mg twice daily   He has hx of multiple falls.   He is married and lives with wife.   Daughter in law wants to make sure wax was removed.   Review of Systems:  Review of Systems  Constitutional:  Negative for chills, fever and weight loss.  HENT:  Positive for hearing loss. Negative for tinnitus.   Respiratory:  Negative for cough, sputum production and shortness of breath.    Cardiovascular:  Positive for leg swelling. Negative for chest pain and palpitations.  Gastrointestinal:  Negative for abdominal pain, constipation, diarrhea and heartburn.  Genitourinary:  Negative for dysuria, frequency and urgency.  Musculoskeletal:  Negative for back pain, falls, joint pain and myalgias.  Skin: Negative.   Neurological:  Negative for dizziness and headaches.  Psychiatric/Behavioral:  Positive for memory loss. Negative for depression. The patient does not have insomnia.     Past Medical History:  Diagnosis Date   Atrial fibrillation [facility])    Atrial fibrillation, chronic ([place]) 03/13/2008   Qualifier: Diagnosis of  By: Burnice Logan  MD, Doretha Sou    Diverticulitis    Esophageal dysmotility    Esophageal dysphagia 07/29/2018   Family history of diabetes insipidus    FHx: colon cancer    H. pylori infection    Hiatal hernia    History of colonic polyps    Hyperlipidemia    Hypertension    IDA (iron deficiency anemia)    Neoplasm of prostate    special screening malignant    Obesity    Palpitation    Peptic ulcer disease    Presbyesophagus    Schatzki's ring    Past Surgical History:  Procedure Laterality Date   2-D Echocardiogram  November 2009, 2011   Anal Fistula Repair     BALLOON DILATION N/A 01/17/2022   Procedure: [place];  Surgeon: Jackquline Denmark, MD;  Location: [facility] ENDOSCOPY;  Service: Gastroenterology;  Laterality: N/A;  BIOPSY  07/14/2020   Procedure: BIOPSY;  Surgeon: [name], MD;  Location[facility] ENDOSCOPY;  Service: Gastroenterology;;   BIOPSY  01/17/2022   Procedure: BIOPSY;  Surgeon: Jackquline Denmark, MD;  Location: [facility] ENDOSCOPY;  Service: Gastroenterology;;   CARDIAC VALVE SURGERY  1996   status post balloon valvuloplasty at [place]  2004   COLONOSCOPY  December 2005   ESOPHAGOGASTRODUODENOSCOPY  07/14/2020   ESOPHAGOGASTRODUODENOSCOPY N/A 01/17/2022   Procedure: ESOPHAGOGASTRODUODENOSCOPY (EGD);  Surgeon: Jackquline Denmark, MD;  Location: [facility];  Service: Gastroenterology;  Laterality: N/A;   ESOPHAGOGASTRODUODENOSCOPY (EGD) WITH PROPOFOL N/A 07/14/2020   Procedure: ESOPHAGOGASTRODUODENOSCOPY (EGD) WITH PROPOFOL;  Surgeon: [name], MD;  [facility] ENDOSCOPY;  Service: Gastroenterology;  Laterality: N/A;   ETT  1991   Negative   HEMOSTASIS CLIP PLACEMENT  07/14/2020   HEMOSTASIS CLIP PLACEMENT   HEMOSTASIS CLIP PLACEMENT  07/14/2020   Procedure: HEMOSTASIS CLIP PLACEMENT;  Surgeon: [name], MD;  Location: [facility] ENDOSCOPY;  Service: Gastroenterology;;   KNEE ARTHROSCOPY     Right   TONSILLECTOMY     Social History:   reports that he has never smoked. He has never used smokeless tobacco. He reports that he does not drink alcohol and does not use drugs.  Family History  Problem Relation Age of Onset   Cancer Mother    Heart Problems Father    Stroke Father    Heart attack Father    Breast cancer Sister    Stroke Brother    Heart Problems Brother    Esophageal cancer Neg Hx     Medications: Patient's Medications  New Prescriptions   No medications on file  Previous Medications   FUROSEMIDE (LASIX) 20 MG TABLET    Take 1 tablet (20 mg total) by mouth 2 (two) times daily.   METOPROLOL TARTRATE (LOPRESSOR) 100 MG TABLET    Take 1 tablet by mouth twice daily.  Modified Medications   No medications on file  Discontinued Medications   COVID-19 MRNA VACCINE 2023-2024 (COMIRNATY) SYRINGE    Inject into the muscle.    Physical Exam:  Vitals:   12/01/22 1304  BP: 118/80  Pulse: 98  Temp: (!) 97.1 F (36.2 C)  TempSrc: Temporal  SpO2: 97%  Weight: 209 lb (94.8 kg)  Height: 5' 11.03" (1.804 m)   Body mass index is 29.13 kg/m. Wt Readings from Last 3 Encounters:  12/01/22 209 lb (94.8 kg)  11/27/22 209 lb 14.4 oz (95.2 kg)  11/08/22 209 lb 3.2 oz (94.9 kg)    Physical Exam Constitutional:      General: He is not in acute distress.    Appearance: He is  well-developed. He is not diaphoretic.  HENT:     Head: Normocephalic and atraumatic.     Right Ear: External ear normal. There is impacted cerumen.     Left Ear: Tympanic membrane, ear canal and external ear normal.     Mouth/Throat:     Pharynx: No oropharyngeal exudate.  Eyes:     Conjunctiva/sclera: Conjunctivae normal.     Pupils: Pupils are equal, round, and reactive to light.  Cardiovascular:     Rate and Rhythm: Normal rate and regular rhythm.     Heart sounds: Normal heart sounds.  Pulmonary:     Effort: Pulmonary effort is normal.     Breath sounds: Normal breath sounds.  Abdominal:     General: Bowel sounds are normal.     Palpations:  Abdomen is soft.  Musculoskeletal:        General: No tenderness.     Cervical back: Normal range of motion and neck supple.     Right lower leg: No edema.     Left lower leg: No edema.  Skin:    General: Skin is warm and dry.  Neurological:     Mental Status: He is alert.     Gait: Gait abnormal.     Labs reviewed: Basic Metabolic Panel: Recent Labs    12/26/21 0918 01/15/22 1658 01/17/22 0603 07/11/22 0933 10/04/22 0944 10/21/22 0945  NA 140   < > 139 139 143 140  K 5.1   < > 3.7 5.0 4.0 4.0  CL 107   < > 110 103 105 105  CO2 25   < > '24 23 23 29  '$ GLUCOSE 153*   < > 94 83 102* 96  BUN 22   < > '15 23 23 '$ 24*  CREATININE 0.99   < > 0.85 0.82 0.91 0.86  CALCIUM 9.6   < > 8.5* 9.2 9.0 8.7*  MG  --   --  1.9  --   --   --   PHOS  --   --  2.6  --   --   --   TSH 2.83  --   --   --   --   --    < > = values in this interval not displayed.   Liver Function Tests: Recent Labs    01/16/22 0604 01/17/22 0603 07/11/22 0933  AST '22 23 23  '$ ALT '18 18 19  '$ ALKPHOS 63 65 98  BILITOT 1.4* 1.5* 0.5  PROT 5.7* 5.7* 6.0  ALBUMIN 3.3* 3.2* 4.0   Recent Labs    01/15/22 1658  LIPASE 18   No results for input(s): "AMMONIA" in the last 8760 hours. CBC: Recent Labs    01/15/22 1658 01/16/22 0604 07/11/22 0933  10/04/22 0944 10/21/22 0945  WBC 11.9*   < > CANCELED 9.9 9.5  NEUTROABS 8.6*  --  CANCELED  --   --   HGB 17.3*   < > CANCELED 16.3 16.2  HCT 52.6*   < > CANCELED 47.3 48.0  MCV 97.2   < > CANCELED 96 97.6  PLT 213   < > CANCELED 209 181   < > = values in this interval not displayed.   Lipid Panel: Recent Labs    12/26/21 0918 07/11/22 0933  CHOL 132 131  HDL 36.80* 42  LDLCALC 74 70  TRIG 106.0 101  CHOLHDL 4 3.1   TSH: Recent Labs    12/26/21 0918  TSH 2.83   A1C: Lab Results  Component Value Date   HGBA1C 5.6 07/11/2022     Assessment/Plan 1. Bilateral leg edema -stable on lasix  -encouraged to elevate legs above level of heart as tolerates, low sodium diet, compression hose as tolerates (on in am, off in pm)  2. Permanent atrial fibrillation (HCC) -rate controlled on lopressor, he has opted not to take anticoagulation due to risk facts.  3. Bilateral hearing loss, unspecified hearing loss type - Ambulatory referral to Audiology  4. Impacted cerumen of right ear -ear lavage completed and use of curette. Pt tolerated well.   5. Memory loss Noted, will get MMSE at [facility], has support at home with wife and family  75. Cerebral atrophy ([place]) -noted to head CT from 2021   Return in about 6 months (  around 06/03/2023) for routine follow up .  Carlos American. [place], [facility] [phone]

## 2022-12-01 NOTE — Patient Instructions (Addendum)
Please schedule AWV after 3/27 (last was 12/26/21)  RECOMMENDATIONS FOR ALL PATIENTS WITH MEMORY PROBLEMS: 1. Continue to exercise (Recommend 30 minutes of walking everyday, or 3 hours every week) 2. Increase social interactions - continue going to [place] and enjoy social gatherings with friends and family 3. Eat healthy, avoid fried foods and eat more fruits and vegetables 4. Maintain adequate blood pressure, blood sugar, and blood cholesterol level. Reducing the risk of stroke and cardiovascular disease also helps promoting better memory. 5. Avoid stressful situations. Live a simple life and avoid aggravations. 6. Organize your time and prepare for the next day in anticipation. 7. Sleep well, avoid any interruptions of sleep and avoid any distractions in the bedroom that may interfere with adequate sleep quality 8. Avoid sugar, avoid sweets as there is a strong link between excessive sugar intake, diabetes, and cognitive impairment 9. Mediterranean diet, which has been shown to help patients reduce the risk of progressive memory disorders and reduces cardiovascular risk. This includes eating fish, eat fruits and green leafy vegetables, nuts like almonds and hazelnuts, walnuts, and also use olive oil. Avoid fast foods and fried foods as much as possible. Avoid sweets and sugar as sugar use has been linked to worsening of memory function.

## 2022-12-04 ENCOUNTER — Other Ambulatory Visit (HOSPITAL_BASED_OUTPATIENT_CLINIC_OR_DEPARTMENT_OTHER): Payer: Self-pay

## 2022-12-11 ENCOUNTER — Encounter (HOSPITAL_BASED_OUTPATIENT_CLINIC_OR_DEPARTMENT_OTHER): Payer: Self-pay | Admitting: Physical Therapy

## 2022-12-11 ENCOUNTER — Ambulatory Visit (HOSPITAL_BASED_OUTPATIENT_CLINIC_OR_DEPARTMENT_OTHER): Payer: Medicare Other | Attending: General Practice | Admitting: Physical Therapy

## 2022-12-11 DIAGNOSIS — R2689 Other abnormalities of gait and mobility: Secondary | ICD-10-CM | POA: Diagnosis not present

## 2022-12-11 DIAGNOSIS — R2681 Unsteadiness on feet: Secondary | ICD-10-CM | POA: Diagnosis not present

## 2022-12-11 DIAGNOSIS — M6281 Muscle weakness (generalized): Secondary | ICD-10-CM | POA: Insufficient documentation

## 2022-12-11 NOTE — Therapy (Signed)
OUTPATIENT PHYSICAL THERAPY LOWER EXTREMITY EVALUATION   Patient Name: Glen [name] MRN: [id number] DOB:[birth year], 89 y.o., male Today's Date: 12/11/2022  END OF SESSION:   Past Medical History:  Diagnosis Date   Atrial fibrillation [facility])    Atrial fibrillation, chronic ([place]) 03/13/2008   Qualifier: Diagnosis of  By: Burnice Logan  MD, Doretha Sou    Diverticulitis    Esophageal dysmotility    Esophageal dysphagia 07/29/2018   Family history of diabetes insipidus    FHx: colon cancer    H. pylori infection    Hiatal hernia    History of colonic polyps    Hyperlipidemia    Hypertension    IDA (iron deficiency anemia)    Obesity    Palpitation    Peptic ulcer disease    Presbyesophagus    Schatzki's ring    Past Surgical History:  Procedure Laterality Date   2-D Echocardiogram  November 2009, 2011   Anal Fistula Repair     BALLOON DILATION N/A 01/17/2022   Procedure: Stacie Acres;  Surgeon: Jackquline Denmark, MD;  Location: [facility];  Service: Gastroenterology;  Laterality: N/A;   BIOPSY  07/14/2020   Procedure: BIOPSY;  Surgeon: [name], MD;  Location: [facility] ENDOSCOPY;  Service: Gastroenterology;;   BIOPSY  01/17/2022   Procedure: BIOPSY;  Surgeon: Jackquline Denmark, MD;  Location: [facility] ENDOSCOPY;  Service: Gastroenterology;;   CARDIAC VALVE SURGERY  1996   status post balloon valvuloplasty at [place]  2004   COLONOSCOPY  December 2005   ESOPHAGOGASTRODUODENOSCOPY  07/14/2020   ESOPHAGOGASTRODUODENOSCOPY N/A 01/17/2022   Procedure: ESOPHAGOGASTRODUODENOSCOPY (EGD);  Surgeon: Jackquline Denmark, MD;  Location: [facility]OS[facility];  Service: Gastroenterology;  Laterality: N/A;   ESOPHAGOGASTRODUODENOSCOPY (EGD) WITH PROPOFOL N/A 07/14/2020   Procedure: ESOPHAGOGASTRODUODENOSCOPY (EGD) WITH PROPOFOL;  Surgeon: [name], MD;  Location: [facility] ENDOSCOPY;  Service: Gastroenterology;  Laterality: N/A;   ETT  1991   Negative   HEMOSTASIS CLIP PLACEMENT  07/14/2020    HEMOSTASIS CLIP PLACEMENT   HEMOSTASIS CLIP PLACEMENT  07/14/2020   Procedure: HEMOSTASIS CLIP PLACEMENT;  Surgeon: [name], MD;  Location: [facility] ENDOSCOPY;  Service: Gastroenterology;;   KNEE ARTHROSCOPY     Right   TONSILLECTOMY     Patient Active Problem List   Diagnosis Date Noted   History of GI bleed 10/04/2022   Permanent atrial fibrillation ([place]) 10/04/2022   Physical deconditioning 01/16/2022   Dehydration 01/16/2022   Peptic ulcer disease    Anemia, unspecified 07/16/2020   Fall 07/13/2020   Osteoarthritis 06/25/2019   Onychomycosis 07/29/2018   Peripheral edema 11/18/2015   Impaired glucose tolerance 04/19/2015   Chronic atrial fibrillation ([place]) 11/07/2010   Essential hypertension 01/20/2009   Dyslipidemia 07/31/2007   History of colonic polyps 07/31/2007    PCP: Danford Bad NP    REFERRING PROVIDER: Coletta Memos NP  REFERRING DIAG:  Diagnosis  R53.1 (ICD-10-CM) - General weakness   THERAPY DIAG:  No diagnosis found.  Rationale for Evaluation and Treatment: Rehabilitation  ONSET DATE: fall on 10/21/2022   SUBJECTIVE:   SUBJECTIVE STATEMENT: The patient had a fall in January. His legs gave out on him. He hit his knee but it isn't doing too bad right now. He hasn't had any other falls. He has been making sure to hod onto his walker. He does feel syncopal at times.   PERTINENT HISTORY: PMH: HTN, Afib, OA, HLD, hernia, IDA, neoplasm of prostate, cardiac valve surgery, knee arthroscopy  PAIN:  Are you having pain? Yes: NPRS scale: 3/10 Pain location: bilateral LE  Pain description: aching  Aggravating factors: patient retains fluid/ standing and walking  Relieving factors: hurts less when they are not swollen   PRECAUTIONS: Fall  WEIGHT BEARING RESTRICTIONS: No  FALLS:  Has patient fallen in last 6 months? No  LIVING ENVIRONMENT:  Lives with: lives with their spouse but son is 10 minutes away  Lives in: House/apartment Stairs: Yes:  Internal: 4 steps; can reach both does not have to go upstairs inside the home  Has following equipment at home: Gilford Rile - 2 wheeled and [employer] - 4 wheeled, shower chair, grab bars  OCCUPATION: retired   Office manager and rec: collects coins   PLOF: Independent with basic ADLs  PATIENT GOALS:   No more falls   NEXT MD VISIT: Nothing scheduled  OBJECTIVE:   Vitals: HR 98  B/P  121/84  DIAGNOSTIC FINDINGS:  Following fall x-ray of right knee:IMPRESSION: 1. No acute findings. 2. Severe lateral compartment osteoarthritis. PATIENT SURVEYS:    COGNITION: Overall cognitive status: Within functional limits for tasks assessed     SENSATION: WFL  EDEMA:  Per visual inspection bilateral edema  POSTURE:    PALPATION: Mild tenderness to palpation in right lateral knee  LOWER EXTREMITY ROM:  Active ROM Right eval Left eval  Hip flexion    Hip extension    Hip abduction    Hip adduction    Hip internal rotation    Hip external rotation    Knee flexion Mild pain with right knee flexion   Knee extension    Ankle dorsiflexion    Ankle plantarflexion    Ankle inversion    Ankle eversion     (Blank rows = not tested)  LOWER EXTREMITY MMT:  MMT Right eval Left eval  Hip flexion 28.7 32.2  Hip extension    Hip abduction 26.0 22.0  Hip adduction    Hip internal rotation    Hip external rotation    Knee flexion    Knee extension 36.9 33.6  Ankle dorsiflexion    Ankle plantarflexion    Ankle inversion    Ankle eversion     (Blank rows = not tested)   FUNCTIONAL TESTS:  5x sit to stand 22 seconds   BERG Balance Test          Date:   Sit to Stand 3  Standing unsupported 3  Sitting with back unsupported but feet supported 4  Stand to sit  3  Transfers  2  Standing unsupported with eyes closed 3  Standing unsupported feet together 3  From standing position, reach forward with outstretched arm 2  From standing position, pick up object from floor 1  From  standing position, turn and look behind over each shoulder 2  Turn 360 2  Standing unsupported, alternately place foot on step 1  Standing unsupported, one foot in front 0  Standing on one leg 0  Total:  29     GAIT: Flexed posture on the walker; decreased hip flexion    TODAY'S TREATMENT:  DATE:  Has previous HEP; reviewed previous HEP    PATIENT EDUCATION:  Education details: reviewed HEP, symptom management,  Person educated: Patient Education method: Explanation Education comprehension: verbalized understanding, returned demonstration, verbal cues required, tactile cues required, and needs further education  HOME EXERCISE PROGRAM: Has prior HEP  ASSESSMENT:  CLINICAL IMPRESSION: Patient is a 89 year old male who presents to therapy status post fall on 10/21/2022.  He hit his left knee which was sore for a few weeks but he now feels better.  He has baseline soreness in his right knee.  Since his fall he has been more careful about using his walker.  He has had no falls since that last fall.  He does feel overall like his balance is declining.  He feels like his legs just give out on him sometimes.  Per 5 times sit to stand test and Berg balance testing the patient is at a high fall risk.  He would benefit from skilled therapy to improve balance to improve decrease fall risk.  OBJECTIVE IMPAIRMENTS: Abnormal gait, decreased activity tolerance, decreased balance, decreased mobility, difficulty walking, decreased ROM, decreased strength, and pain.   ACTIVITY LIMITATIONS: carrying, lifting, bending, sitting, standing, squatting, stairs, transfers, and locomotion level  PARTICIPATION LIMITATIONS: meal prep, cleaning, laundry, driving, shopping, community activity, and yard work  PERSONAL FACTORS: Age and 1-2 comorbidities: right knee OA  are also affecting  patient's functional outcome.   REHAB POTENTIAL: Good  CLINICAL DECISION MAKING: Evolving/moderate complexity  EVALUATION COMPLEXITY: Moderate   GOALS: Goals reviewed with patient? Yes  SHORT TERM GOALS: Target date: 01/02/2023   Patient will increase Berg scale by 7 point Baseline: Goal status: INITIAL  2.  Patient will decrease 5 times sit to stand time by 5 seconds Baseline:  Goal status: INITIAL  3.  Patient will increase gross bilateral lower extremity strength by 5 pounds Baseline:  Goal status: INITIAL  4.  Patient will be independent with baseline HEP Baseline:  Goal status: INITIAL    LONG TERM GOALS: Target date: 01/23/2023    Patient will report no falls over 6-week stretch Baseline:  Goal status: INITIAL  2.  Patient will self report improved balance when performing transfers Baseline:  Goal status: INITIAL  3.  Patient will ambulate 3000 feet without increased fatigue and with good balance with least restrictive assistive device Baseline:  Goal status: INITIAL  PLAN:  PT FREQUENCY: 2x/week  PT DURATION: 6 weeks  PLANNED INTERVENTIONS: Therapeutic exercises, Therapeutic activity, Neuromuscular re-education, Balance training, Gait training, Patient/Family education, Self Care, Joint mobilization, Stair training, DME instructions, Aquatic Therapy, Cryotherapy, Moist heat, Ultrasound, and Manual therapy  PLAN FOR NEXT SESSION:  Patient is performing home exercises at home.  Main focus will be on improving balance.  Consider static balance.  Consider hurdles.  Consider progression into gym exercises if tolerated.  Work on Training and development officer.   Carney Living, PT 12/11/2022, 9:03 AM

## 2022-12-12 ENCOUNTER — Encounter (HOSPITAL_BASED_OUTPATIENT_CLINIC_OR_DEPARTMENT_OTHER): Payer: Self-pay | Admitting: Physical Therapy

## 2023-01-04 ENCOUNTER — Ambulatory Visit (HOSPITAL_BASED_OUTPATIENT_CLINIC_OR_DEPARTMENT_OTHER): Payer: Medicare Other | Attending: General Practice

## 2023-01-04 ENCOUNTER — Encounter (HOSPITAL_BASED_OUTPATIENT_CLINIC_OR_DEPARTMENT_OTHER): Payer: Self-pay

## 2023-01-04 DIAGNOSIS — R2681 Unsteadiness on feet: Secondary | ICD-10-CM | POA: Insufficient documentation

## 2023-01-04 DIAGNOSIS — M6281 Muscle weakness (generalized): Secondary | ICD-10-CM | POA: Diagnosis not present

## 2023-01-04 DIAGNOSIS — R2689 Other abnormalities of gait and mobility: Secondary | ICD-10-CM | POA: Insufficient documentation

## 2023-01-04 NOTE — Therapy (Signed)
OUTPATIENT PHYSICAL THERAPY LOWER EXTREMITY EVALUATION   Patient Name: Glen [name] MRN: [id number] DOB:02-11-1928, 89 y.o., male Today's Date: 01/04/2023  END OF SESSION:  PT End of Session - 01/04/23 1517     Visit Number 2    Number of Visits 12    Date for PT Re-Evaluation 01/23/23    PT Start Time 1518    PT Stop Time [id number]    PT Time Calculation (min) 40 min    Equipment Utilized During Treatment Gait belt    Activity Tolerance Patient tolerated treatment well    Behavior During Therapy [facility] for tasks assessed/performed             Past Medical History:  Diagnosis Date   Atrial fibrillation    Atrial fibrillation, chronic 03/13/2008   Qualifier: Diagnosis of  By: Burnice Logan  MD, Doretha Sou    Diverticulitis    Esophageal dysmotility    Esophageal dysphagia 07/29/2018   Family history of diabetes insipidus    FHx: colon cancer    H. pylori infection    Hiatal hernia    History of colonic polyps    Hyperlipidemia    Hypertension    IDA (iron deficiency anemia)    Obesity    Palpitation    Peptic ulcer disease    Presbyesophagus    Schatzki's ring    Past Surgical History:  Procedure Laterality Date   2-D Echocardiogram  November 2009, 2011   Anal Fistula Repair     BALLOON DILATION N/A 01/17/2022   Procedure: Stacie Acres;  Surgeon: Jackquline Denmark, MD;  Location: [facility];  Service: Gastroenterology;  Laterality: N/A;   BIOPSY  07/14/2020   Procedure: BIOPSY;  Surgeon: [name], MD;  Location: [facility] ENDOSCOPY;  Service: Gastroenterology;;   BIOPSY  01/17/2022   Procedure: BIOPSY;  Surgeon: Jackquline Denmark, MD;  Location: [facility] ENDOSCOPY;  Service: Gastroenterology;;   CARDIAC VALVE SURGERY  1996   status post balloon valvuloplasty at [place]  2004   COLONOSCOPY  December 2005   ESOPHAGOGASTRODUODENOSCOPY  07/14/2020   ESOPHAGOGASTRODUODENOSCOPY N/A 01/17/2022   Procedure: ESOPHAGOGASTRODUODENOSCOPY (EGD);  Surgeon: Jackquline Denmark,  MD;  Location: [facility] ENDOS[facility];  Service: Gastroenterology;  Laterality: N/A;   ESOPHAGOGASTRODUODENOSCOPY (EGD) WITH PROPOFOL N/A 07/14/2020   Procedure: ESOPHAGOGASTRODUODENOSCOPY (EGD) WITH PROPOFOL;  Surgeon: [name], MD;  Location: [facility] ENDOSCOPY;  Service: Gastroenterology;  Laterality: N/A;   ETT  1991   Negative   HEMOSTASIS CLIP PLACEMENT  07/14/2020   HEMOSTASIS CLIP PLACEMENT   HEMOSTASIS CLIP PLACEMENT  07/14/2020   Procedure: HEMOSTASIS CLIP PLACEMENT;  Surgeon: [name], MD;  Location: [facility] ENDOSCOPY;  Service: Gastroenterology;;   KNEE ARTHROSCOPY     Right   TONSILLECTOMY     Patient Active Problem List   Diagnosis Date Noted   History of GI bleed 10/04/2022   Permanent atrial fibrillation 10/04/2022   Physical deconditioning 01/16/2022   Dehydration 01/16/2022   Peptic ulcer disease    Anemia, unspecified 07/16/2020   Fall 07/13/2020   Osteoarthritis 06/25/2019   Onychomycosis 07/29/2018   Peripheral edema 11/18/2015   Impaired glucose tolerance 04/19/2015   Chronic atrial fibrillation ([place]) 11/07/2010   Essential hypertension 01/20/2009   Dyslipidemia 07/31/2007   History of colonic polyps 07/31/2007    PCP: Danford Bad NP    REFERRING PROVIDER: Coletta Memos NP  REFERRING DIAG:  Diagnosis  R53.1 (ICD-10-CM) - General weakness   THERAPY DIAG:  Muscle weakness (generalized)  Unsteadiness on feet  Other abnormalities of gait and mobility  Rationale for Evaluation and Treatment: Rehabilitation  ONSET DATE: fall on 10/21/2022   SUBJECTIVE:   SUBJECTIVE STATEMENT: The patient had a fall in January. His legs gave out on him. He hit his knee but it isn't doing too bad right now. He hasn't had any other falls. He has been making sure to hod onto his walker. He does feel syncopal at times.   PERTINENT HISTORY: PMH: HTN, Afib, OA, HLD, hernia, IDA, neoplasm of prostate, cardiac valve surgery, knee arthroscopy  PAIN:  Are you having pain? Yes:  NPRS scale: 3/10 Pain location: bilateral LE  Pain description: aching  Aggravating factors: patient retains fluid/ standing and walking  Relieving factors: hurts less when they are not swollen   PRECAUTIONS: Fall  WEIGHT BEARING RESTRICTIONS: No  FALLS:  Has patient fallen in last 6 months? No  LIVING ENVIRONMENT:  Lives with: lives with their spouse but son is 10 minutes away  Lives in: House/apartment Stairs: Yes: Internal: 4 steps; can reach both does not have to go upstairs inside the home  Has following equipment at home: Gilford Rile - 2 wheeled and [employer] - 4 wheeled, shower chair, grab bars  OCCUPATION: retired   Office manager and rec: collects coins   PLOF: Independent with basic ADLs  PATIENT GOALS:   No more falls   NEXT MD VISIT: Nothing scheduled  OBJECTIVE:   Vitals: HR 98  B/P  121/84  DIAGNOSTIC FINDINGS:  Following fall x-ray of right knee:IMPRESSION: 1. No acute findings. 2. Severe lateral compartment osteoarthritis. PATIENT SURVEYS:    COGNITION: Overall cognitive status: Within functional limits for tasks assessed     SENSATION: WFL  EDEMA:  Per visual inspection bilateral edema  POSTURE:    PALPATION: Mild tenderness to palpation in right lateral knee  LOWER EXTREMITY ROM:  Active ROM Right eval Left eval  Hip flexion    Hip extension    Hip abduction    Hip adduction    Hip internal rotation    Hip external rotation    Knee flexion Mild pain with right knee flexion   Knee extension    Ankle dorsiflexion    Ankle plantarflexion    Ankle inversion    Ankle eversion     (Blank rows = not tested)  LOWER EXTREMITY MMT:  MMT Right eval Left eval  Hip flexion 28.7 32.2  Hip extension    Hip abduction 26.0 22.0  Hip adduction    Hip internal rotation    Hip external rotation    Knee flexion    Knee extension 36.9 33.6  Ankle dorsiflexion    Ankle plantarflexion    Ankle inversion    Ankle eversion     (Blank rows = not  tested)   FUNCTIONAL TESTS:  5x sit to stand 22 seconds   BERG Balance Test          Date:   Sit to Stand 3  Standing unsupported 3  Sitting with back unsupported but feet supported 4  Stand to sit  3  Transfers  2  Standing unsupported with eyes closed 3  Standing unsupported feet together 3  From standing position, reach forward with outstretched arm 2  From standing position, pick up object from floor 1  From standing position, turn and look behind over each shoulder 2  Turn 360 2  Standing unsupported, alternately place foot on step 1  Standing unsupported, one foot in  front 0  Standing on one leg 0  Total:  29     GAIT: Flexed posture on the walker; decreased hip flexion    TODAY'S TREATMENT:                                                                                                                              DATE: 4/4  Sit to stands x10 Standing marches HR/TR Seated clams GTB Seated march Seated LAQ 3# 2x10ea  Gait: 39ft with FWW, no standing rest breaks   PATIENT EDUCATION:  Education details: reviewed HEP, symptom management,  Person educated: Patient Education method: Explanation Education comprehension: verbalized understanding, returned demonstration, verbal cues required, tactile cues required, and needs further education  HOME EXERCISE PROGRAM: Has prior HEP  ASSESSMENT:  CLINICAL IMPRESSION: Pt reported some dizziness with standing exercises today, so alternated standing with seated exercises which helped. Also provided pt with water bottle. He had no c/o pain with exercises. Able to ambulate 325ft using rollator without rest break. Observed fatigue by end of gait training.   OBJECTIVE IMPAIRMENTS: Abnormal gait, decreased activity tolerance, decreased balance, decreased mobility, difficulty walking, decreased ROM, decreased strength, and pain.   ACTIVITY LIMITATIONS: carrying, lifting, bending, sitting, standing, squatting, stairs,  transfers, and locomotion level  PARTICIPATION LIMITATIONS: meal prep, cleaning, laundry, driving, shopping, community activity, and yard work  PERSONAL FACTORS: Age and 1-2 comorbidities: right knee OA  are also affecting patient's functional outcome.   REHAB POTENTIAL: Good  CLINICAL DECISION MAKING: Evolving/moderate complexity  EVALUATION COMPLEXITY: Moderate   GOALS: Goals reviewed with patient? Yes  SHORT TERM GOALS: Target date: 01/02/2023   Patient will increase Berg scale by 7 point Baseline: Goal status: INITIAL  2.  Patient will decrease 5 times sit to stand time by 5 seconds Baseline:  Goal status: INITIAL  3.  Patient will increase gross bilateral lower extremity strength by 5 pounds Baseline:  Goal status: INITIAL  4.  Patient will be independent with baseline HEP Baseline:  Goal status: INITIAL    LONG TERM GOALS: Target date: 01/23/2023    Patient will report no falls over 6-week stretch Baseline:  Goal status: INITIAL  2.  Patient will self report improved balance when performing transfers Baseline:  Goal status: INITIAL  3.  Patient will ambulate 3000 feet without increased fatigue and with good balance with least restrictive assistive device Baseline:  Goal status: INITIAL  PLAN:  PT FREQUENCY: 2x/week  PT DURATION: 6 weeks  PLANNED INTERVENTIONS: Therapeutic exercises, Therapeutic activity, Neuromuscular re-education, Balance training, Gait training, Patient/Family education, Self Care, Joint mobilization, Stair training, DME instructions, Aquatic Therapy, Cryotherapy, Moist heat, Ultrasound, and Manual therapy  PLAN FOR NEXT SESSION:  Patient is performing home exercises at home.  Main focus will be on improving balance.  Consider static balance.  Consider hurdles.  Consider progression into gym exercises if tolerated.  Work on Training and development officer.   Sherlynn Carbon, PTA  01/04/2023, 4:38 PM

## 2023-01-05 ENCOUNTER — Encounter: Payer: Self-pay | Admitting: Nurse Practitioner

## 2023-01-05 ENCOUNTER — Ambulatory Visit (INDEPENDENT_AMBULATORY_CARE_PROVIDER_SITE_OTHER): Payer: Medicare Other | Admitting: Nurse Practitioner

## 2023-01-05 VITALS — BP 112/70 | HR 93 | Temp 97.8°F | Resp 18 | Ht 71.65 in | Wt 213.8 lb

## 2023-01-05 DIAGNOSIS — Z0000 Encounter for general adult medical examination without abnormal findings: Secondary | ICD-10-CM

## 2023-01-05 NOTE — Progress Notes (Signed)
Subjective:   Glen [name] is a 89 y.o. male who presents for Medicare Annual/Subsequent preventive examination.  Review of Systems     Cardiac Risk Factors include: advanced age (>3755men, 46>65 women);dyslipidemia     Objective:    Today's Vitals   01/05/23 1523 01/05/23 1540  BP: 112/70   Pulse: 93   Resp: 18   Temp: 97.8 F (36.6 C)   TempSrc: Temporal   SpO2: 96%   Weight: 213 lb 12.8 oz (97 kg)   Height: 5' 11.65" (1.82 m)   PainSc:  4    Body mass index is 29.28 kg/m.     01/05/2023    3:23 PM 12/11/2022   12:43 PM 12/01/2022    1:08 PM 11/08/2022    3:01 PM 04/05/2022    3:19 PM 01/17/2022   12:22 PM 01/15/2022    1:39 PM  Advanced Directives  Does Patient Have a Medical Advance Directive? Yes Yes Yes Yes Yes Yes Yes  Type of Advance Directive Living will;Healthcare Power of State Street Corporationttorney Healthcare Power of [place]orney;Living will Healthcare Power of [place]orney;Living will Healthcare Power of [place];Living will;Out of facility DNR (pink MOST or yellow form) Healthcare Power of [place]orney;Living will Living will Living will  Does patient want to make changes to medical advance directive?   No - Patient declined No - Patient declined No - Patient declined  No - Patient declined  Copy of Healthcare Power of Attorney in Chart? No - copy requested No - copy requested No - copy requested No - copy requested No - copy requested      Current Medications (verified) Outpatient Encounter Medications as of 01/05/2023  Medication Sig   furosemide (LASIX) 20 MG tablet Take 1 tablet (20 mg total) by mouth 2 (two) times daily.   metoprolol tartrate (LOPRESSOR) 100 MG tablet Take 1 tablet by mouth twice daily.   Multiple Vitamin (MULTIVITAMIN) tablet Take 1 tablet by mouth daily.   No facility-administered encounter medications on file as of 01/05/2023.    Allergies (verified) Protonix [pantoprazole], Codeine, and Ferrous sulfate   History: Past Medical History:  Diagnosis Date   Atrial  fibrillation    Atrial fibrillation, chronic 03/13/2008   Qualifier: Diagnosis of  By: Amador CunasKwiatkowski  MD, Janett LabellaPeter F    Diverticulitis    Esophageal dysmotility    Esophageal dysphagia 07/29/2018   Family history of diabetes insipidus    FHx: colon cancer    H. pylori infection    Hiatal hernia    History of colonic polyps    Hyperlipidemia    Hypertension    IDA (iron deficiency anemia)    Obesity    Palpitation    Peptic ulcer disease    Presbyesophagus    Schatzki's ring    Past Surgical History:  Procedure Laterality Date   2-D Echocardiogram  November 2009, 2011   Anal Fistula Repair     BALLOON DILATION N/A 01/17/2022   Procedure: Rubye BeachBALLOON DILATION;  Surgeon: Lynann BolognaGupta, Rajesh, MD;  [facility];  Service: Gastroenterology;  Laterality: N/A;   BIOPSY  07/14/2020   Procedure: BIOPSY;  Surgeon: Beverley FiedlerPyrtle, Jay M, MD;  [facility];  Service: Gastroenterology;;   BIOPSY  01/17/2022   Procedure: BIOPSY;  Surgeon: Lynann BolognaGupta, Rajesh, MD;  [facility];  Service: Gastroenterology;;   CARDIAC VALVE SURGERY  1996   [facility]   CATARACT EXTRACTION  2004   COLONOSCOPY  December 2005   ESOPHAGOGASTRODUODENOSCOPY  07/14/2020  ESOPHAGOGASTRODUODENOSCOPY N/A 01/17/2022   Procedure: ESOPHAGOGASTRODUODENOSCOPY (EGD);  Surgeon: Lynann Bologna, MD;  [facility];  Service: Gastroenterology;  Laterality: N/A;   ESOPHAGOGASTRODUODENOSCOPY (EGD) WITH PROPOFOL N/A 07/14/2020   Procedure: ESOPHAGOGASTRODUODENOSCOPY (EGD) WITH PROPOFOL;  Surgeon: Beverley Fiedler, MD;  Location[facility] END[facility];  Service: Gastroenterology;  Laterality: N/A;   ETT  1991   Negative   HEMOSTASIS CLIP PLACEMENT  07/14/2020   HEMOSTASIS CLIP PLACEMENT   HEMOSTASIS CLIP PLACEMENT  07/14/2020   Procedure: HEMOSTASIS CLIP PLACEMENT;  Surgeon: Beverley Fiedler, MD;  [facility]: [facility];  Service: Gastroenterology;;   KNEE ARTHROSCOPY     Right   TONSILLECTOMY      Family History  Problem Relation Age of Onset   Cancer Mother    Heart Problems Father    Stroke Father    Heart attack Father    Breast cancer Sister    Stroke Brother    Heart Problems Brother    Esophageal cancer Neg Hx    Social History   Socioeconomic History   Marital status: Married    Spouse name: Not on file   Number of children: Not on file   Years of education: Not on file   Highest education level: Not on file  Occupational History   Occupation: Retired    Associate Professor: RETIRED  Tobacco Use   Smoking status: Never    Passive exposure: Past   Smokeless tobacco: Never  Vaping Use   Vaping Use: Never used  Substance and Sexual Activity   Alcohol use: Never   Drug use: Never   Sexual activity: Not on file  Other Topics Concern   Not on file  Social History Narrative   Regular exercise: Yes: [employer] 4 times/week   Social Determinants of Health   Financial Resource Strain: Low Risk  (12/26/2021)   Overall Financial Resource Strain (CARDIA)    Difficulty of Paying Living Expenses: Not hard at all  Food Insecurity: No Food Insecurity (10/23/2022)   Hunger Vital Sign    Worried About Running Out of Food in the Last Year: Never true    Ran Out of Food in the Last Year: Never true  Transportation Needs: No Transportation Needs (10/23/2022)   PRAPARE - Administrator, Civil Service (Medical): No    Lack of Transportation (Non-Medical): No  Physical Activity: Insufficiently Active (12/26/2021)   Exercise Vital Sign    Days of Exercise per Week: 5 days    Minutes of Exercise per Session: 10 min  Stress: No Stress Concern Present (12/26/2021)   [employer] of Occupational Health - Occupational Stress Questionnaire    Feeling of Stress : Not at all  Social Connections: Moderately Isolated (12/26/2021)   Social Connection and Isolation Panel [NHANES]    Frequency of Communication with Friends and Family: More than three times a week    Frequency of  Social Gatherings with Friends and Family: Once a week    Attends Religious Services: Never    Database administrator or Organizations: No    Attends Engineer, structural: Never    Marital Status: Married    Tobacco Counseling Counseling given: Not Answered   Clinical Intake:  Pre-visit preparation completed: Yes  Pain : 0-10 Pain Score: 4  Pain Type: Chronic pain Pain Location: Leg Pain Orientation: Lower Pain Descriptors / Indicators: Aching     BMI - recorded: 29 Nutritional Status: BMI 25 -29 Overweight Diabetes: No  How often do you need  to have someone help you when you read instructions, pamphlets, or other written materials from your doctor or pharmacy?: 1 - Never  Diabetic?no         Activities of Daily Living    01/05/2023    3:25 PM 07/11/2022    8:16 AM  In your present state of health, do you have any difficulty performing the following activities:  Hearing? 0 1  Vision? 0 0  Difficulty concentrating or making decisions?  0  Walking or climbing stairs? 1 0  Dressing or bathing? 0 0  Doing errands, shopping? 1 0  Preparing Food and eating ? N   Using the Toilet? N   In the past six months, have you accidently leaked urine? N   Do you have problems with loss of bowel control? N   Managing your Medications? N   Managing your Finances? N   Housekeeping or managing your Housekeeping? N     Patient Care Team: Sharon Seller, NP as PCP - General (Geriatric Medicine) Jodelle Red, MD as PCP - Cardiology (Cardiology) Anette Riedel, MD as Rounding Team (Internal Medicine)  Indicate any recent Medical Services you may have received from other than [facility] providers in the past year (date may be approximate).     Assessment:   This is a routine wellness examination for Glen [name].  Hearing/Vision screen No results found.  Dietary issues and exercise activities discussed: Current Exercise Habits: The patient does not participate  in regular exercise at present, Exercise limited by: orthopedic condition(s)   Goals Addressed   None    Depression Screen    01/05/2023    3:24 PM 12/01/2022    1:07 PM 07/11/2022    8:16 AM 12/26/2021    9:27 AM 06/27/2021   10:02 AM 10/11/2020    3:15 PM 07/23/2020    1:13 PM  PHQ 2/9 Scores  PHQ - 2 Score 0 0 0 0 0 0 1  PHQ- 9 Score   4  2  7     Fall Risk    01/05/2023    3:24 PM 12/01/2022    1:06 PM 11/08/2022    3:03 PM 07/11/2022    8:15 AM 12/26/2021    9:30 AM  Fall Risk   Falls in the past year? 1 1 1  0 0  Number falls in past yr: 0 0 0 0 0  Injury with Fall? 0 0 0 0 0  Risk for fall due to : History of fall(s);Impaired balance/gait No Fall Risks History of fall(s) No Fall Risks Impaired mobility;Impaired balance/gait;Impaired vision  Risk for fall due to: Comment     just very careful and use walker  Follow up Falls evaluation completed;Education provided;Falls prevention discussed Falls evaluation completed Falls evaluation completed Falls evaluation completed;Education provided     FALL RISK PREVENTION PERTAINING TO THE HOME:  Any stairs in or around the home? Yes  If so, are there any without handrails? No  Home free of loose throw rugs in walkways, pet beds, electrical cords, etc? Yes  Adequate lighting in your home to reduce risk of falls? Yes   ASSISTIVE DEVICES UTILIZED TO PREVENT FALLS:  Life alert? Yes  Use of a cane, walker or w/c? Yes  Grab bars in the bathroom? Yes  Shower chair or bench in shower? Yes  Elevated toilet seat or a handicapped toilet? Yes   TIMED UP AND GO:  Was the test performed? No .    Cognitive Function:  01/05/2023    3:26 PM  MMSE - Mini Mental State Exam  Orientation to time 5  Orientation to Place 5  Registration 3  Attention/ Calculation 5  Recall 2  Language- name 2 objects 2  Language- repeat 1  Language- follow 3 step command 3  Language- read & follow direction 1  Write a sentence 1  Copy design 1  Total  score 29        12/26/2021    9:32 AM 10/11/2020    3:24 PM 06/25/2019   10:33 AM  6CIT Screen  What Year? 0 points 0 points 0 points  What month? 0 points 0 points 0 points  What time? 0 points  0 points  Count back from 20 0 points 2 points 0 points  Months in reverse 0 points 2 points 0 points  Repeat phrase 0 points 4 points 2 points  Total Score 0 points  2 points    Immunizations Immunization History  Administered Date(s) Administered   COVID-19, mRNA, vaccine(Comirnaty)12 years and older 08/25/2022   Fluad Quad(high Dose 65+) 06/25/2019, 06/21/2020, 06/27/2021   Influenza Split 08/23/2011, 06/25/2012   Influenza Whole 10/02/2005, 08/11/2009, 08/04/2010   Influenza, High Dose Seasonal PF 07/15/2015, 06/26/2016, 07/23/2017, 07/03/2018   Influenza, Quadrivalent, Recombinant, Inj, Pf 07/11/2022   Influenza,inj,Quad PF,6+ Mos 07/07/2013, 06/29/2014   PFIZER(Purple Top)SARS-COV-2 Vaccination 10/24/2019, 11/14/2019, 07/01/2020, 08/10/2021   Pneumococcal Conjugate-13 10/17/2013   Pneumococcal Polysaccharide-23 10/02/2005   Td 03/17/2009    TDAP status: Due, Education has been provided regarding the importance of this vaccine. Advised may receive this vaccine at local pharmacy or Health Dept. Aware to provide a copy of the vaccination record if obtained from local pharmacy or Health Dept. Verbalized acceptance and understanding.  Flu Vaccine status: Up to date  Pneumococcal vaccine status: Up to date  Covid-19 vaccine status: Information provided on how to obtain vaccines.   Qualifies for Shingles Vaccine? Yes   Zostavax completed No   Shingrix Completed?: No.    Education has been provided regarding the importance of this vaccine. Patient has been advised to call insurance company to determine out of pocket expense if they have not yet received this vaccine. Advised may also receive vaccine at local pharmacy or Health Dept. Verbalized acceptance and understanding.  Screening  Tests Health Maintenance  Topic Date Due   DTaP/Tdap/Td (2 - Tdap) 03/18/2019   COVID-19 Vaccine (6 - 2023-24 season) 01/21/2023 (Originally 10/20/2022)   INFLUENZA VACCINE  05/03/2023   Medicare Annual Wellness (AWV)  01/05/2024   Pneumonia Vaccine 19+ Years old  Completed   HPV VACCINES  Aged Out   Zoster Vaccines- Shingrix  Discontinued    Health Maintenance  Health Maintenance Due  Topic Date Due   DTaP/Tdap/Td (2 - Tdap) 03/18/2019    Colorectal cancer screening: No longer required.   Lung Cancer Screening: (Low Dose CT Chest recommended if Age 79-80 years, 30 pack-year currently smoking OR have quit w/in 15years.) does not qualify.   Lung Cancer Screening Referral: na  Additional Screening:  Hepatitis C Screening: does not qualify  Vision Screening: Recommended annual ophthalmology exams for early detection of glaucoma and other disorders of the eye. Is the patient up to date with their annual eye exam?  Yes  Who is the provider or what is the name of the office in which the patient attends annual eye exams? [facility] ophthalmology   If pt is not established with a provider, would they like to be referred to  a provider to establish care? No .   Dental Screening: Recommended annual dental exams for proper oral hygiene  Community Resource Referral / Chronic Care Management: CRR required this visit?  No   CCM required this visit?  No      Plan:     I have personally reviewed and noted the following in the patient's chart:   Medical and social history Use of alcohol, tobacco or illicit drugs  Current medications and supplements including opioid prescriptions. Patient is not currently taking opioid prescriptions. Functional ability and status Nutritional status Physical activity Advanced directives List of other physicians Hospitalizations, surgeries, and ER visits in previous 12 months Vitals Screenings to include cognitive, depression, and falls Referrals  and appointments  In addition, I have reviewed and discussed with patient certain preventive protocols, quality metrics, and best practice recommendations. A written personalized care plan for preventive services as well as general preventive health recommendations were provided to patient.     Sharon SellerJessica K Spike Desilets, NP   01/05/2023   [facility]

## 2023-01-05 NOTE — Patient Instructions (Signed)
  Glen [name] , Thank you for taking time to come for your Medicare Wellness Visit. I appreciate your ongoing commitment to your health goals. Please review the following plan we discussed and let me know if I can assist you in the future.   These are the goals we discussed:  Goals      LIFESTYLE - DECREASE FALLS RISK     Patient Stated     Get rid of fluid on feet      Patient Stated     None at this time         This is a list of the screening recommended for you and due dates:  Health Maintenance  Topic Date Due   DTaP/Tdap/Td vaccine (2 - Tdap) 03/18/2019   COVID-19 Vaccine (6 - 2023-24 season) 01/21/2023*   Flu Shot  05/03/2023   Medicare Annual Wellness Visit  01/05/2024   Pneumonia Vaccine  Completed   HPV Vaccine  Aged Out   Zoster (Shingles) Vaccine  Discontinued  *Topic was postponed. The date shown is not the original due date.

## 2023-01-16 ENCOUNTER — Encounter: Payer: Self-pay | Admitting: Orthopedic Surgery

## 2023-01-16 ENCOUNTER — Ambulatory Visit (INDEPENDENT_AMBULATORY_CARE_PROVIDER_SITE_OTHER): Payer: Medicare Other | Admitting: Orthopedic Surgery

## 2023-01-16 VITALS — BP 110/78 | HR 98 | Temp 97.6°F | Resp 16 | Ht 71.65 in | Wt 209.6 lb

## 2023-01-16 DIAGNOSIS — R52 Pain, unspecified: Secondary | ICD-10-CM

## 2023-01-16 NOTE — Progress Notes (Signed)
Careteam: Patient Care Team: Sharon Seller, NP as PCP - General (Geriatric Medicine) Jodelle Red, MD as PCP - Cardiology (Cardiology) Anette Riedel, MD as Rounding Team (Internal Medicine)  Seen by: Hazle Nordmann, AGNP-C  PLACE OF SERVICE:  [facility]  Advanced Directive information Does Patient Have a Medical Advance Directive?: Yes, Type of Advance Directive: Healthcare Power of [place];Living will, Does patient want to make changes to medical advance directive?: No - Patient declined  Allergies  Allergen Reactions   Protonix [Pantoprazole] Other (See Comments)    Hallucinations   Codeine Other (See Comments)    constpation   Ferrous Sulfate Itching and Swelling    Chief Complaint  Patient presents with   Acute Visit    Patient complains of body pain.      HPI: Patient is a 89 y.o. male seen today for acute visit due to generalized body pain.   Son present during encounter.  Reports generalized body pain from head to toes > 1 month. Rated as 5/10, described as aching. Appears to be worse in AM and slightly better during day. He will have episodes at night as well. No recent fall or injury. Ambulates with walker. He is enrolled in PT. He cancelled this weeks session due to pain. He is taking tylenol 975 mg once or twice daily. He admits to being more sedentary since pain began. Treatment options discussed with patient, he is not interested in trying another medication at this time. Denies cold symptoms, chest pain, sob or chronic back pain.   Review of Systems:  Review of Systems  Constitutional:  Negative for chills, fever and malaise/fatigue.  HENT:  Negative for congestion and sore throat.   Respiratory:  Negative for cough, shortness of breath and wheezing.   Cardiovascular:  Negative for chest pain.  Gastrointestinal:  Negative for constipation.  Musculoskeletal:  Positive for joint pain. Negative for falls.  Neurological:  Negative for dizziness,  weakness and headaches.  Psychiatric/Behavioral:  Negative for depression. The patient is not nervous/anxious.     Past Medical History:  Diagnosis Date   Atrial fibrillation    Atrial fibrillation, chronic 03/13/2008   Qualifier: Diagnosis of  By: Amador Cunas  MD, Janett Labella    Diverticulitis    Esophageal dysmotility    Esophageal dysphagia 07/29/2018   Family history of diabetes insipidus    FHx: colon cancer    H. pylori infection    Hiatal hernia    History of colonic polyps    Hyperlipidemia    Hypertension    IDA (iron deficiency anemia)    Obesity    Palpitation    Peptic ulcer disease    Presbyesophagus    Schatzki's ring    Past Surgical History:  Procedure Laterality Date   2-D Echocardiogram  November 2009, 2011   Anal Fistula Repair     BALLOON DILATION N/A 01/17/2022   Procedure: Rubye Beach;  Surgeon: Lynann Bologna, MD;  [facility];  Service: Gastroenterology;  Laterality: N/A;   BIOPSY  07/14/2020   Procedure: BIOPSY;  Surgeon: Beverley Fiedler, MD;  [facility];  Service: Gastroenterology;;   BIOPSY  01/17/2022   Procedure: BIOPSY;  Surgeon: Lynann Bologna, MD;  Location: [facility];  Service: Gastroenterology;;   CARDIAC VALVE SURGERY  1996   status post [facility] val[facility] at [facility]   CATARACT EXTRACTION  2004   COLONOSCOPY  December 2005   ESOPHAGOGASTRODUODENOSCOPY  07/14/2020   ESOPHAGOGASTRODUODENOSCOPY N/A 01/17/2022   Procedure:  ESOPHAGOGASTRODUODENOSCOPY (EGD);  Surgeon: Lynann Bologna, MD;  [facility];  Service: Gastroenterology;  Laterality: N/A;   ESOPHAGOGASTRODUODENOSCOPY (EGD) WITH PROPOFOL N/A 07/14/2020   Procedure: ESOPHAGOGASTRODUODENOSCOPY (EGD) WITH PROPOFOL;  Surgeon: Beverley Fiedler, MD;  Location: [facility] END[facility]COPY;  Service: Gastroenterology;  Laterality: N/A;   ETT  1991   Negative   HEMOSTASIS CLIP PLACEMENT  07/14/2020   HEMOSTASIS CLIP PLACEMENT   HEMOSTASIS CLIP PLACEMENT  07/14/2020   Procedure:  HEMOSTASIS CLIP PLACEMENT;  Surgeon: Beverley Fiedler, MD;  Location: [facility] ENDOSCOPY;  Service: Gastroenterology;;   KNEE ARTHROSCOPY     Right   TONSILLECTOMY     Social History:   reports that he has never smoked. He has been exposed to tobacco smoke. He has never used smokeless tobacco. He reports that he does not drink alcohol and does not use drugs.  Family History  Problem Relation Age of Onset   Cancer Mother    Heart Problems Father    Stroke Father    Heart attack Father    Breast cancer Sister    Stroke Brother    Heart Problems Brother    Esophageal cancer Neg Hx     Medications: Patient's Medications  New Prescriptions   No medications on file  Previous Medications   FUROSEMIDE (LASIX) 20 MG TABLET    Take 1 tablet (20 mg total) by mouth 2 (two) times daily.   METAMUCIL FIBER PO    Take 3 tablets by mouth in the morning and at bedtime.   METOPROLOL TARTRATE (LOPRESSOR) 100 MG TABLET    Take 1 tablet by mouth twice daily.   MULTIPLE VITAMIN (MULTIVITAMIN) TABLET    Take 1 tablet by mouth daily.  Modified Medications   No medications on file  Discontinued Medications   No medications on file    Physical Exam:  Vitals:   01/16/23 1434  BP: 110/78  Pulse: 98  Resp: 16  Temp: 97.6 F (36.4 C)  SpO2: 97%  Weight: 209 lb 9.6 oz (95.1 kg)  Height: 5' 11.65" (1.82 m)   Body mass index is 28.71 kg/m. Wt Readings from Last 3 Encounters:  01/16/23 209 lb 9.6 oz (95.1 kg)  01/05/23 213 lb 12.8 oz (97 kg)  12/01/22 209 lb (94.8 kg)    Physical Exam Vitals reviewed.  Constitutional:      General: He is not in acute distress. HENT:     Head: Normocephalic.  Eyes:     General:        Right eye: No discharge.        Left eye: No discharge.  Cardiovascular:     Rate and Rhythm: Normal rate and regular rhythm.     Pulses: Normal pulses.     Heart sounds: Normal heart sounds.  Pulmonary:     Effort: Pulmonary effort is normal. No respiratory distress.      Breath sounds: Normal breath sounds. No wheezing.  Abdominal:     General: Bowel sounds are normal. There is no distension.     Palpations: Abdomen is soft.     Tenderness: There is no abdominal tenderness.  Musculoskeletal:     Cervical back: Neck supple.     Right lower leg: No edema.     Left lower leg: No edema.  Skin:    General: Skin is warm and dry.     Capillary Refill: Capillary refill takes less than 2 seconds.  Neurological:     General: No focal deficit present.  Mental Status: He is alert and oriented to person, place, and time.  Psychiatric:        Mood and Affect: Mood normal.        Behavior: Behavior normal.     Labs reviewed: Basic Metabolic Panel: Recent Labs    01/17/22 0603 07/11/22 0933 10/04/22 0944 10/21/22 0945  NA 139 139 143 140  K 3.7 5.0 4.0 4.0  CL 110 103 105 105  CO2 GLUCOSE 94 83 102* 96  BUN 24*  CREATININE 0.85 0.82 0.91 0.86  CALCIUM 8.5* 9.2 9.0 8.7*  MG 1.9  --   --   --   PHOS 2.6  --   --   --    Liver Function Tests: Recent Labs    01/17/22 0603 07/11/22 0933  AST 23 23  ALT 18 19  ALKPHOS 65 98  BILITOT 1.5* 0.5  PROT 5.7* 6.0  ALBUMIN 3.2* 4.0   No results for input(s): "LIPASE", "AMYLASE" in the last 8760 hours. No results for input(s): "AMMONIA" in the last 8760 hours. CBC: Recent Labs    07/11/22 0933 10/04/22 0944 10/21/22 0945  WBC CANCELED 9.9 9.5  NEUTROABS CANCELED  --   --   HGB CANCELED 16.3 16.2  HCT CANCELED 47.3 48.0  MCV CANCELED 96 97.6  PLT CANCELED 209 181   Lipid Panel: Recent Labs    07/11/22 0933  CHOL 131  HDL 42  LDLCALC 70  TRIG 101  CHOLHDL 3.1   TSH: No results for input(s): "TSH" in the last 8760 hours. A1C: Lab Results  Component Value Date   HGBA1C 5.6 07/11/2022     Assessment/Plan 1. Generalized pain - generalized pain from head to toes x 1 month> aching - exam unremarkable - no recent fall or injury - suspect OA associated with  advanced age> pain improved throughout day - recommend tylenol 1000 mg po BID - discussed voltaren gel, Biofreeze and salonopas patches as well - continue PT> advised not to cancel - encourage hydration with water to prevent muscle cramping> may try yellow mustard  Total time: 21 minutes. Greater than 50% of total time spent doing patient education regarding generalized pain including symptom/medication management.     Next appt: none Moselle Rister Scherry Ran  [facility] [phone]

## 2023-01-16 NOTE — Patient Instructions (Addendum)
Do not recommend strong narcotic at this time> more complications than being helpful> also makes you a fall risk more  Recommend taking tylenol 1000 mg twice daily> one dose in AM and PM. May add extra dose of tylenol in the middle of day for breakthrough pain.   Recommend using voltaren gel or biofreeze to areas that are more painful  DRINK LOTS OF WATER  KEEP DOING PT DAILY!!!  MAY TRY DAB OF YELLOW MUSTARD TO HELP WITH MUSCLE CRAMPING  AVOID PROLONGED SITTING

## 2023-01-17 ENCOUNTER — Encounter (HOSPITAL_BASED_OUTPATIENT_CLINIC_OR_DEPARTMENT_OTHER): Payer: Medicare Other | Admitting: Physical Therapy

## 2023-01-24 ENCOUNTER — Ambulatory Visit (HOSPITAL_BASED_OUTPATIENT_CLINIC_OR_DEPARTMENT_OTHER): Payer: Medicare Other | Admitting: Physical Therapy

## 2023-01-24 DIAGNOSIS — M6281 Muscle weakness (generalized): Secondary | ICD-10-CM | POA: Diagnosis not present

## 2023-01-24 DIAGNOSIS — R2681 Unsteadiness on feet: Secondary | ICD-10-CM | POA: Diagnosis not present

## 2023-01-24 DIAGNOSIS — R2689 Other abnormalities of gait and mobility: Secondary | ICD-10-CM | POA: Diagnosis not present

## 2023-01-24 NOTE — Therapy (Unsigned)
OUTPATIENT PHYSICAL THERAPY LOWER EXTREMITY Treatment / Progress note   Patient Name: Glen [name] MRN: [id number] DOB:1928-03-12, 89 y.o., male Today's Date: 01/04/2023  END OF SESSION:  PT End of Session - 01/04/23 1517     Visit Number 2    Number of Visits 12    Date for PT Re-Evaluation 01/23/23    PT Start Time 1518    PT Stop Time 1558    PT Time Calculation (min) 40 min    Equipment Utilized During Treatment Gait belt    Activity Tolerance Patient tolerated treatment well    Behavior During Therapy [facility] for tasks assessed/performed             Past Medical History:  Diagnosis Date   Atrial fibrillation    Atrial fibrillation, chronic 03/13/2008   Qualifier: Diagnosis of  By: Amador Cunas  MD, Janett Labella    Diverticulitis    Esophageal dysmotility    Esophageal dysphagia 07/29/2018   Family history of diabetes insipidus    FHx: colon cancer    H. pylori infection    Hiatal hernia    History of colonic polyps    Hyperlipidemia    Hypertension    IDA (iron deficiency anemia)    Obesity    Palpitation    Peptic ulcer disease    Presbyesophagus    Schatzki's ring    Past Surgical History:  Procedure Laterality Date   2-D Echocardiogram  November 2009, 2011   Anal Fistula Repair     BALLOON DILATION N/A 01/17/2022   Procedure: Rubye Beach;  Surgeon: Lynann Bologna, MD;  Location: [facility];  Service: Gastroenterology;  Laterality: N/A;   BIOPSY  07/14/2020   Procedure: BIOPSY;  Surgeon: Beverley Fiedler, MD;  Location: [facility] ENDOSCOPY;  Service: Gastroenterology;;   BIOPSY  01/17/2022   Procedure: BIOPSY;  Surgeon: Lynann Bologna, MD;  Location: [facility] ENDOSCOPY;  Service: Gastroenterology;;   CARDIAC VALVE SURGERY  1996   status post balloon valvuloplasty at [facility]   CATARACT EXTRACTION  2004   COLONOSCOPY  December 2005   ESOPHAGOGASTRODUODENOSCOPY  07/14/2020   ESOPHAGOGASTRODUODENOSCOPY N/A 01/17/2022   Procedure: ESOPHAGOGASTRODUODENOSCOPY (EGD);  Surgeon:  Lynann Bologna, MD;  Location: [facility];  Service: Gastroenterology;  Laterality: N/A;   ESOPHAGOGASTRODUODENOSCOPY (EGD) WITH PROPOFOL N/A 07/14/2020   Procedure: ESOPHAGOGASTRODUODENOSCOPY (EGD) WITH PROPOFOL;  Surgeon: Beverley Fiedler, MD;  Location: [facility] ENDOSCOPY;  Service: Gastroenterology;  Laterality: N/A;   ETT  1991   Negative   HEMOSTASIS CLIP PLACEMENT  07/14/2020   HEMOSTASIS CLIP PLACEMENT   HEMOSTASIS CLIP PLACEMENT  07/14/2020   Procedure: HEMOSTASIS CLIP PLACEMENT;  Surgeon: Beverley Fiedler, MD;  Location: [facility] ENDOSCOPY;  Service: Gastroenterology;;   KNEE ARTHROSCOPY     Right   TONSILLECTOMY     Patient Active Problem List   Diagnosis Date Noted   History of GI bleed 10/04/2022   Permanent atrial fibrillation 10/04/2022   Physical deconditioning 01/16/2022   Dehydration 01/16/2022   Peptic ulcer disease    Anemia, unspecified 07/16/2020   Fall 07/13/2020   Osteoarthritis 06/25/2019   Onychomycosis 07/29/2018   Peripheral edema 11/18/2015   Impaired glucose tolerance 04/19/2015   Chronic atrial fibrillation (HCC) 11/07/2010   Essential hypertension 01/20/2009   Dyslipidemia 07/31/2007   History of colonic polyps 07/31/2007   Progress Note Reporting Period 12/11/2022 to 01/24/2023  See note below for Objective Data and Assessment of Progress/Goals.      PCP: Emi Holes NP  REFERRING PROVIDER: Edd Fabian NP  REFERRING DIAG:  Diagnosis  R53.1 (ICD-10-CM) - General weakness   THERAPY DIAG:  Muscle weakness (generalized)  Unsteadiness on feet  Other abnormalities of gait and mobility  Rationale for Evaluation and Treatment: Rehabilitation  ONSET DATE: fall on 10/21/2022   SUBJECTIVE:   SUBJECTIVE STATEMENT: The patient had a fall in January. His legs gave out on him. He hit his knee but it isn't doing too bad right now. He hasn't had any other falls. He has been making sure to hod onto his walker. He does feel syncopal at times.    PERTINENT HISTORY: PMH: HTN, Afib, OA, HLD, hernia, IDA, neoplasm of prostate, cardiac valve surgery, knee arthroscopy  PAIN:  Are you having pain? Yes: NPRS scale: 3/10 Pain location: bilateral LE  Pain description: aching  Aggravating factors: patient retains fluid/ standing and walking  Relieving factors: hurts less when they are not swollen   PRECAUTIONS: Fall  WEIGHT BEARING RESTRICTIONS: No  FALLS:  Has patient fallen in last 6 months? No  LIVING ENVIRONMENT:  Lives with: lives with their spouse but son is 10 minutes away  Lives in: House/apartment Stairs: Yes: Internal: 4 steps; can reach both does not have to go upstairs inside the home  Has following equipment at home: [employer] Humphreys - 2 wheeled and [employer] - 4 wheeled, shower chair, grab bars  OCCUPATION: retired   Presenter, broadcasting and rec: collects coins   PLOF: Independent with basic ADLs  PATIENT GOALS:   No more falls   NEXT MD VISIT: Nothing scheduled  OBJECTIVE:   Vitals: HR 98  B/P  121/84  DIAGNOSTIC FINDINGS:  Following fall x-ray of right knee:IMPRESSION: 1. No acute findings. 2. Severe lateral compartment osteoarthritis. PATIENT SURVEYS:    COGNITION: Overall cognitive status: Within functional limits for tasks assessed     SENSATION: WFL  EDEMA:  Per visual inspection bilateral edema  POSTURE:    PALPATION: Mild tenderness to palpation in right lateral knee  LOWER EXTREMITY ROM:  Active ROM Right eval Left eval  Hip flexion    Hip extension    Hip abduction    Hip adduction    Hip internal rotation    Hip external rotation    Knee flexion Mild pain with right knee flexion   Knee extension    Ankle dorsiflexion    Ankle plantarflexion    Ankle inversion    Ankle eversion     (Blank rows = not tested)  LOWER EXTREMITY MMT:  MMT Right eval Left eval Right  Left  Hip flexion 28.7 32.2 21.7 18.5  Hip extension      Hip abduction 26.0 22.0 31.2 28.4  Hip adduction      Hip  internal rotation      Hip external rotation      Knee flexion      Knee extension 36.9 33.6 45 48  Ankle dorsiflexion      Ankle plantarflexion      Ankle inversion      Ankle eversion       (Blank rows = not tested)   FUNCTIONAL TESTS:  5x sit to stand 22 seconds   BERG Balance Test          Date:   Sit to Stand 3  Standing unsupported 3  Sitting with back unsupported but feet supported 4  Stand to sit  3  Transfers  2  Standing unsupported with eyes closed 3  Standing unsupported feet together  3  From standing position, reach forward with outstretched arm 2  From standing position, pick up object from floor 1  From standing position, turn and look behind over each shoulder 2  Turn 360 2  Standing unsupported, alternately place foot on step 1  Standing unsupported, one foot in front 0  Standing on one leg 0  Total:  29     GAIT: Flexed posture on the walker; decreased hip flexion    TODAY'S TREATMENT:                                                                                                                              DATE:  4/24 Nu-step 5 min  Hip abduction Blue band 3x10 STS x10  STS wit foam pad on feet 3x5 Foam pad Narrow BOS x20, 3x15sec eyes closed Standing heel raise 3x10  4/4 Sit to stands x10 Standing marches HR/TR Seated clams GTB Seated march Seated LAQ 3# 2x10ea  Gait: 360ft with FWW, no standing rest breaks   PATIENT EDUCATION:  Education details: reviewed HEP, symptom management,  Person educated: Patient Education method: Explanation Education comprehension: verbalized understanding, returned demonstration, verbal cues required, tactile cues required, and needs further education  HOME EXERCISE PROGRAM: Has prior HEP  ASSESSMENT:  CLINICAL IMPRESSION: Therapy re-assessed the patient today. Because of scheduling this was only his 2nd treat back. We did not re-test his full BERG 2nd to lack of treatment. We did look at his  strength which is about the same. We focused on balance today during his treatment. The previous visit he ambulated 250' without a seated rest break. He would benefit from further skilled therapy 2w8. See below for goal specific progress.  OBJECTIVE IMPAIRMENTS: Abnormal gait, decreased activity tolerance, decreased balance, decreased mobility, difficulty walking, decreased ROM, decreased strength, and pain.   ACTIVITY LIMITATIONS: carrying, lifting, bending, sitting, standing, squatting, stairs, transfers, and locomotion level  PARTICIPATION LIMITATIONS: meal prep, cleaning, laundry, driving, shopping, community activity, and yard work  PERSONAL FACTORS: Age and 1-2 comorbidities: right knee OA  are also affecting patient's functional outcome.   REHAB POTENTIAL: Good  CLINICAL DECISION MAKING: Evolving/moderate complexity  EVALUATION COMPLEXITY: Moderate   GOALS: Goals reviewed with patient? Yes  SHORT TERM GOALS: Target date: 01/02/2023   Patient will increase Berg scale by 7 point Baseline: Goal status: Not tested 4/24  2.  Patient will decrease 5 times sit to stand time by 5 seconds Baseline:  Goal status: Not tested 4/24  3.  Patient will increase gross bilateral lower extremity strength by 5 pounds Baseline:  Goal status: partially met, still in progress 4/24  4.  Patient will be independent with baseline HEP Baseline:  Goal status: Met 4/24    LONG TERM GOALS: Target date: 01/23/2023    Patient will report no falls over 6-week stretch Baseline:  Goal status: Achieved  2.  Patient will self report improved balance when performing transfers Baseline:  Goal  status: work in progress 4/24  3.  Patient will ambulate 3000 feet without increased fatigue and with good balance with least restrictive assistive device Baseline:  Goal status: work in progress 2/24  PLAN:  PT FREQUENCY: 2x/week  PT DURATION: 6 weeks  PLANNED INTERVENTIONS: Therapeutic exercises,  Therapeutic activity, Neuromuscular re-education, Balance training, Gait training, Patient/Family education, Self Care, Joint mobilization, Stair training, DME instructions, Aquatic Therapy, Cryotherapy, Moist heat, Ultrasound, and Manual therapy  PLAN FOR NEXT SESSION:  Patient is performing home exercises at home.  Main focus will be on improving balance. Consider hurdles or step ups. Consider progression into gym exercises if tolerated.    Cristal Ford SPT   Lorayne Bender PT DPT  01/04/2023, 4:38 PM   I have reviewed and concur with this student's documentation.   During this treatment session, the therapist was present, participating in and directing the treatment.

## 2023-01-25 ENCOUNTER — Encounter (HOSPITAL_BASED_OUTPATIENT_CLINIC_OR_DEPARTMENT_OTHER): Payer: Self-pay | Admitting: Physical Therapy

## 2023-01-31 ENCOUNTER — Ambulatory Visit (HOSPITAL_BASED_OUTPATIENT_CLINIC_OR_DEPARTMENT_OTHER): Payer: Medicare Other | Attending: General Practice | Admitting: Physical Therapy

## 2023-01-31 ENCOUNTER — Encounter (HOSPITAL_BASED_OUTPATIENT_CLINIC_OR_DEPARTMENT_OTHER): Payer: Self-pay | Admitting: Physical Therapy

## 2023-01-31 DIAGNOSIS — R2689 Other abnormalities of gait and mobility: Secondary | ICD-10-CM | POA: Diagnosis not present

## 2023-01-31 DIAGNOSIS — R2681 Unsteadiness on feet: Secondary | ICD-10-CM

## 2023-01-31 DIAGNOSIS — M6281 Muscle weakness (generalized): Secondary | ICD-10-CM | POA: Insufficient documentation

## 2023-01-31 NOTE — Therapy (Unsigned)
OUTPATIENT PHYSICAL THERAPY LOWER EXTREMITY Treatment / Progress note   Patient Name: Glen [name] MRN: [id number] DOB:12-15-1927, 89 y.o., male Today's Date: 01/04/2023  END OF SESSION:  PT End of Session - 01/04/23 1517     Visit Number 2    Number of Visits 12    Date for PT Re-Evaluation 01/23/23    PT Start Time 1518    PT Stop Time 1558    PT Time Calculation (min) 40 min    Equipment Utilized During Treatment Gait belt    Activity Tolerance Patient tolerated treatment well    Behavior During Therapy [facility] for tasks assessed/performed             Past Medical History:  Diagnosis Date   Atrial fibrillation    Atrial fibrillation, chronic 03/13/2008   Qualifier: Diagnosis of  By: Amador Cunas  MD, Janett Labella    Diverticulitis    Esophageal dysmotility    Esophageal dysphagia 07/29/2018   Family history of diabetes insipidus    FHx: colon cancer    H. pylori infection    Hiatal hernia    History of colonic polyps    Hyperlipidemia    Hypertension    IDA (iron deficiency anemia)    Obesity    Palpitation    Peptic ulcer disease    Presbyesophagus    Schatzki's ring    Past Surgical History:  Procedure Laterality Date   2-D Echocardiogram  November 2009, 2011   Anal Fistula Repair     BALLOON DILATION N/A 01/17/2022   Procedure: Rubye Beach;  Surgeon: Lynann Bologna, MD;  Location: [facility];  Service: Gastroenterology;  Laterality: N/A;   BIOPSY  07/14/2020   Procedure: BIOPSY;  Surgeon: Beverley Fiedler, MD;  Location: [facility] ENDOSCOPY;  Service: Gastroenterology;;   BIOPSY  01/17/2022   Procedure: BIOPSY;  Surgeon: Lynann Bologna, MD;  Location: [facility] ENDOSCOPY;  Service: Gastroenterology;;   CARDIAC VALVE SURGERY  1996   status post balloon valvuloplasty at [facility]   CATARACT EXTRACTION  2004   COLONOSCOPY  December 2005   ESOPHAGOGASTRODUODENOSCOPY  07/14/2020   ESOPHAGOGASTRODUODENOSCOPY N/A 01/17/2022   Procedure: ESOPHAGOGASTRODUODENOSCOPY (EGD);  Surgeon:  Lynann Bologna, MD;  Location: [facility];  Service: Gastroenterology;  Laterality: N/A;   ESOPHAGOGASTRODUODENOSCOPY (EGD) WITH PROPOFOL N/A 07/14/2020   Procedure: ESOPHAGOGASTRODUODENOSCOPY (EGD) WITH PROPOFOL;  Surgeon: Beverley Fiedler, MD;  Location: [facility] ENDOSCOPY;  Service: Gastroenterology;  Laterality: N/A;   ETT  1991   Negative   HEMOSTASIS CLIP PLACEMENT  07/14/2020   HEMOSTASIS CLIP PLACEMENT   HEMOSTASIS CLIP PLACEMENT  07/14/2020   Procedure: HEMOSTASIS CLIP PLACEMENT;  Surgeon: Beverley Fiedler, MD;  Location: [facility] ENDOSCOPY;  Service: Gastroenterology;;   KNEE ARTHROSCOPY     Right   TONSILLECTOMY     Patient Active Problem List   Diagnosis Date Noted   History of GI bleed 10/04/2022   Permanent atrial fibrillation 10/04/2022   Physical deconditioning 01/16/2022   Dehydration 01/16/2022   Peptic ulcer disease    Anemia, unspecified 07/16/2020   Fall 07/13/2020   Osteoarthritis 06/25/2019   Onychomycosis 07/29/2018   Peripheral edema 11/18/2015   Impaired glucose tolerance 04/19/2015   Chronic atrial fibrillation (HCC) 11/07/2010   Essential hypertension 01/20/2009   Dyslipidemia 07/31/2007   History of colonic polyps 07/31/2007   Progress Note Reporting Period 12/11/2022 to 01/24/2023  See note below for Objective Data and Assessment of Progress/Goals.      PCP: Emi Holes NP  REFERRING PROVIDER: Edd Fabian NP  REFERRING DIAG:  Diagnosis  R53.1 (ICD-10-CM) - General weakness   THERAPY DIAG:  Muscle weakness (generalized)  Unsteadiness on feet  Other abnormalities of gait and mobility  Rationale for Evaluation and Treatment: Rehabilitation  ONSET DATE: fall on 10/21/2022   SUBJECTIVE:   SUBJECTIVE STATEMENT: The patient reports significant fatigue after the last visit. He reports he went home and was not able to do much the next day. He thinks all the standing exercises did it to him. He is doing fait today.  PERTINENT HISTORY: PMH: HTN,  Afib, OA, HLD, hernia, IDA, neoplasm of prostate, cardiac valve surgery, knee arthroscopy  PAIN:  Are you having pain? Yes: NPRS scale: 3/10 Pain location: bilateral LE  Pain description: aching  Aggravating factors: patient retains fluid/ standing and walking  Relieving factors: hurts less when they are not swollen   PRECAUTIONS: Fall  WEIGHT BEARING RESTRICTIONS: No  FALLS:  Has patient fallen in last 6 months? No  LIVING ENVIRONMENT:  Lives with: lives with their spouse but son is 10 minutes away  Lives in: House/apartment Stairs: Yes: Internal: 4 steps; can reach both does not have to go upstairs inside the home  Has following equipment at home: Dan Humphreys - 2 wheeled and [employer] - 4 wheeled, shower chair, grab bars  OCCUPATION: retired   Presenter, broadcasting and rec: collects coins   PLOF: Independent with basic ADLs  PATIENT GOALS:   No more falls   NEXT MD VISIT: Nothing scheduled  OBJECTIVE:   Vitals: HR 98  B/P  121/84  DIAGNOSTIC FINDINGS:  Following fall x-ray of right knee:IMPRESSION: 1. No acute findings. 2. Severe lateral compartment osteoarthritis. PATIENT SURVEYS:    COGNITION: Overall cognitive status: Within functional limits for tasks assessed     SENSATION: WFL  EDEMA:  Per visual inspection bilateral edema  POSTURE:    PALPATION: Mild tenderness to palpation in right lateral knee  LOWER EXTREMITY ROM:  Active ROM Right eval Left eval  Hip flexion    Hip extension    Hip abduction    Hip adduction    Hip internal rotation    Hip external rotation    Knee flexion Mild pain with right knee flexion   Knee extension    Ankle dorsiflexion    Ankle plantarflexion    Ankle inversion    Ankle eversion     (Blank rows = not tested)  LOWER EXTREMITY MMT:  MMT Right eval Left eval Right  Left  Hip flexion 28.7 32.2 21.7 18.5  Hip extension      Hip abduction 26.0 22.0 31.2 28.4  Hip adduction      Hip internal rotation      Hip external  rotation      Knee flexion      Knee extension 36.9 33.6 45 48  Ankle dorsiflexion      Ankle plantarflexion      Ankle inversion      Ankle eversion       (Blank rows = not tested)   FUNCTIONAL TESTS:  5x sit to stand 22 seconds   BERG Balance Test          Date:   Sit to Stand 3  Standing unsupported 3  Sitting with back unsupported but feet supported 4  Stand to sit  3  Transfers  2  Standing unsupported with eyes closed 3  Standing unsupported feet together 3  From standing position, reach forward with outstretched  arm 2  From standing position, pick up object from floor 1  From standing position, turn and look behind over each shoulder 2  Turn 360 2  Standing unsupported, alternately place foot on step 1  Standing unsupported, one foot in front 0  Standing on one leg 0  Total:  29     GAIT: Flexed posture on the walker; decreased hip flexion    TODAY'S TREATMENT:                                                                                                                              DATE:  5/1  SAQ 2x15  Hip abduction 2x15 green  Seated March 2x15 reported some fatigue   LAQ 2x15 bilateral  Hamstring curl green 2x10    Biceps curls 3x10 2lbs  Shoulder press 3x10 2lbs    4/24 Nu-step 5 min  Hip abduction Blue band 3x10 STS x10  STS wit foam pad on feet 3x5 Foam pad Narrow BOS x20, 3x15sec eyes closed Standing heel raise 3x10  4/4 Sit to stands x10 Standing marches HR/TR Seated clams GTB Seated march Seated LAQ 3# 2x10ea  Gait: 359ft with FWW, no standing rest breaks   PATIENT EDUCATION:  Education details: reviewed HEP, symptom management,  Person educated: Patient Education method: Explanation Education comprehension: verbalized understanding, returned demonstration, verbal cues required, tactile cues required, and needs further education  HOME EXERCISE PROGRAM: Has prior HEP  ASSESSMENT:  CLINICAL IMPRESSION: Therapy scaled  patients exercises back to see if he has less of a negative response to activity this visit. He reported fatigue after his exercises today despite not doing standing activity. He only has 1 more visit scheduled. It is unclear if he is getting much benefit at this point from therapy. We will perform a re-assessment next visit. He reports he is doing things at home. Therapy hope to continue to work more on balance.    OBJECTIVE IMPAIRMENTS: Abnormal gait, decreased activity tolerance, decreased balance, decreased mobility, difficulty walking, decreased ROM, decreased strength, and pain.   ACTIVITY LIMITATIONS: carrying, lifting, bending, sitting, standing, squatting, stairs, transfers, and locomotion level  PARTICIPATION LIMITATIONS: meal prep, cleaning, laundry, driving, shopping, community activity, and yard work  PERSONAL FACTORS: Age and 1-2 comorbidities: right knee OA  are also affecting patient's functional outcome.   REHAB POTENTIAL: Good  CLINICAL DECISION MAKING: Evolving/moderate complexity  EVALUATION COMPLEXITY: Moderate   GOALS: Goals reviewed with patient? Yes  SHORT TERM GOALS: Target date: 01/02/2023   Patient will increase Berg scale by 7 point Baseline: Goal status: Not tested 4/24  2.  Patient will decrease 5 times sit to stand time by 5 seconds Baseline:  Goal status: Not tested 4/24  3.  Patient will increase gross bilateral lower extremity strength by 5 pounds Baseline:  Goal status: partially met, still in progress 4/24  4.  Patient will be independent with baseline HEP Baseline:  Goal status: Met 4/24  LONG TERM GOALS: Target date: 01/23/2023    Patient will report no falls over 6-week stretch Baseline:  Goal status: Achieved  2.  Patient will self report improved balance when performing transfers Baseline:  Goal status: work in progress 4/24  3.  Patient will ambulate 3000 feet without increased fatigue and with good balance with least  restrictive assistive device Baseline:  Goal status: work in progress 2/24  PLAN:  PT FREQUENCY: 2x/week  PT DURATION: 6 weeks  PLANNED INTERVENTIONS: Therapeutic exercises, Therapeutic activity, Neuromuscular re-education, Balance training, Gait training, Patient/Family education, Self Care, Joint mobilization, Stair training, DME instructions, Aquatic Therapy, Cryotherapy, Moist heat, Ultrasound, and Manual therapy  PLAN FOR NEXT SESSION:  Patient is performing home exercises at home.  Main focus will be on improving balance. Consider hurdles or step ups. Consider progression into gym exercises if tolerated.      Lorayne Bender PT DPT  01/31/2023   I

## 2023-02-01 ENCOUNTER — Encounter (HOSPITAL_BASED_OUTPATIENT_CLINIC_OR_DEPARTMENT_OTHER): Payer: Self-pay | Admitting: Physical Therapy

## 2023-02-07 ENCOUNTER — Ambulatory Visit (HOSPITAL_BASED_OUTPATIENT_CLINIC_OR_DEPARTMENT_OTHER): Payer: Medicare Other | Admitting: Physical Therapy

## 2023-02-07 ENCOUNTER — Encounter (HOSPITAL_BASED_OUTPATIENT_CLINIC_OR_DEPARTMENT_OTHER): Payer: Self-pay | Admitting: Physical Therapy

## 2023-02-07 DIAGNOSIS — R2689 Other abnormalities of gait and mobility: Secondary | ICD-10-CM

## 2023-02-07 DIAGNOSIS — M6281 Muscle weakness (generalized): Secondary | ICD-10-CM | POA: Diagnosis not present

## 2023-02-07 DIAGNOSIS — R2681 Unsteadiness on feet: Secondary | ICD-10-CM | POA: Diagnosis not present

## 2023-02-07 NOTE — Therapy (Signed)
OUTPATIENT PHYSICAL THERAPY LOWER EXTREMITY Treatment /    Patient Name: Glen [name] MRN: [id number] DOB:02/03/1928, 89 y.o., male Today's Date: 02/07/2023  END OF SESSION:  PT End of Session - 02/07/23 1113     Visit Number 5    Number of Visits 12    Date for PT Re-Evaluation 03/08/23    PT Start Time 1100    PT Stop Time 1143    PT Time Calculation (min) 43 min    Activity Tolerance Patient tolerated treatment well    Behavior During Therapy [facility] for tasks assessed/performed             Past Medical History:  Diagnosis Date   Atrial fibrillation (HCC)    Atrial fibrillation, chronic (HCC) 03/13/2008   Qualifier: Diagnosis of  By: Amador Cunas  MD, Janett Labella    Diverticulitis    Esophageal dysmotility    Esophageal dysphagia 07/29/2018   Family history of diabetes insipidus    FHx: colon cancer    H. pylori infection    Hiatal hernia    History of colonic polyps    Hyperlipidemia    Hypertension    IDA (iron deficiency anemia)    Obesity    Palpitation    Peptic ulcer disease    Presbyesophagus    Schatzki's ring    Past Surgical History:  Procedure Laterality Date   2-D Echocardiogram  November 2009, 2011   Anal Fistula Repair     BALLOON DILATION N/A 01/17/2022   Procedure: Rubye Beach;  Surgeon: Lynann Bologna, MD;  Location: [facility];  Service: Gastroenterology;  Laterality: N/A;   BIOPSY  07/14/2020   Procedure: BIOPSY;  Surgeon: Beverley Fiedler, MD;  Location: [facility] ENDOSCOPY;  Service: Gastroenterology;;   BIOPSY  01/17/2022   Procedure: BIOPSY;  Surgeon: Lynann Bologna, MD;  Location: [facility] ENDOSCOPY;  Service: Gastroenterology;;   CARDIAC VALVE SURGERY  1996   status post balloon valvuloplasty at [facility]   CATARACT EXTRACTION  2004   COLONOSCOPY  December 2005   ESOPHAGOGASTRODUODENOSCOPY  07/14/2020   ESOPHAGOGASTRODUODENOSCOPY N/A 01/17/2022   Procedure: ESOPHAGOGASTRODUODENOSCOPY (EGD);  Surgeon: Lynann Bologna, MD;  Location: [facility];   Service: Gastroenterology;  Laterality: N/A;   ESOPHAGOGASTRODUODENOSCOPY (EGD) WITH PROPOFOL N/A 07/14/2020   Procedure: ESOPHAGOGASTRODUODENOSCOPY (EGD) WITH PROPOFOL;  Surgeon: Beverley Fiedler, MD;  Location: [facility] ENDOSCOPY;  Service: Gastroenterology;  Laterality: N/A;   ETT  1991   Negative   HEMOSTASIS CLIP PLACEMENT  07/14/2020   HEMOSTASIS CLIP PLACEMENT   HEMOSTASIS CLIP PLACEMENT  07/14/2020   Procedure: HEMOSTASIS CLIP PLACEMENT;  Surgeon: Beverley Fiedler, MD;  Location: [facility] ENDOSCOPY;  Service: Gastroenterology;;   KNEE ARTHROSCOPY     Right   TONSILLECTOMY     Patient Active Problem List   Diagnosis Date Noted   History of GI bleed 10/04/2022   Permanent atrial fibrillation (HCC) 10/04/2022   Physical deconditioning 01/16/2022   Dehydration 01/16/2022   Peptic ulcer disease    Anemia, unspecified 07/16/2020   Fall 07/13/2020   Osteoarthritis 06/25/2019   Onychomycosis 07/29/2018   Peripheral edema 11/18/2015   Impaired glucose tolerance 04/19/2015   Chronic atrial fibrillation (HCC) 11/07/2010   Essential hypertension 01/20/2009   Dyslipidemia 07/31/2007   History of colonic polyps 07/31/2007   Progress Note Reporting Period 12/11/2022 to 01/24/2023  See note below for Objective Data and Assessment of Progress/Goals.      PCP: Emi Holes NP    REFERRING PROVIDER: Edd Fabian NP  REFERRING  DIAG:  Diagnosis  R53.1 (ICD-10-CM) - General weakness   THERAPY DIAG:  Muscle weakness (generalized)  Unsteadiness on feet  Other abnormalities of gait and mobility  Rationale for Evaluation and Treatment: Rehabilitation  ONSET DATE: fall on 10/21/2022   SUBJECTIVE:   SUBJECTIVE STATEMENT: The patient reports he was only a little tired after the last visit. He wasn't as bad as he was after the last one. No significant pain today.   PERTINENT HISTORY: PMH: HTN, Afib, OA, HLD, hernia, IDA, neoplasm of prostate, cardiac valve surgery, knee arthroscopy  PAIN:   Are you having pain? Yes: NPRS scale: 3/10 Pain location: bilateral LE  Pain description: aching  Aggravating factors: patient retains fluid/ standing and walking  Relieving factors: hurts less when they are not swollen   PRECAUTIONS: Fall  WEIGHT BEARING RESTRICTIONS: No  FALLS:  Has patient fallen in last 6 months? No  LIVING ENVIRONMENT:  Lives with: lives with their spouse but son is 10 minutes away  Lives in: House/apartment Stairs: Yes: Internal: 4 steps; can reach both does not have to go upstairs inside the home  Has following equipment at home: Dan Humphreys - 2 wheeled and [employer] - 4 wheeled, shower chair, grab bars  OCCUPATION: retired   Presenter, broadcasting and rec: collects coins   PLOF: Independent with basic ADLs  PATIENT GOALS:   No more falls   NEXT MD VISIT: Nothing scheduled  OBJECTIVE:   Vitals: HR 98  B/P  121/84  DIAGNOSTIC FINDINGS:  Following fall x-ray of right knee:IMPRESSION: 1. No acute findings. 2. Severe lateral compartment osteoarthritis. PATIENT SURVEYS:    COGNITION: Overall cognitive status: Within functional limits for tasks assessed     SENSATION: WFL  EDEMA:  Per visual inspection bilateral edema  POSTURE:    PALPATION: Mild tenderness to palpation in right lateral knee  LOWER EXTREMITY ROM:  Active ROM Right eval Left eval  Hip flexion    Hip extension    Hip abduction    Hip adduction    Hip internal rotation    Hip external rotation    Knee flexion Mild pain with right knee flexion   Knee extension    Ankle dorsiflexion    Ankle plantarflexion    Ankle inversion    Ankle eversion     (Blank rows = not tested)  LOWER EXTREMITY MMT:  MMT Right eval Left eval Right  Left  Hip flexion 28.7 32.2 21.7 18.5  Hip extension      Hip abduction 26.0 22.0 31.2 28.4  Hip adduction      Hip internal rotation      Hip external rotation      Knee flexion      Knee extension 36.9 33.6 45 48  Ankle dorsiflexion      Ankle  plantarflexion      Ankle inversion      Ankle eversion       (Blank rows = not tested)   FUNCTIONAL TESTS:  5x sit to stand 22 seconds   BERG Balance Test          Date:   Sit to Stand 3  Standing unsupported 3  Sitting with back unsupported but feet supported 4  Stand to sit  3  Transfers  2  Standing unsupported with eyes closed 3  Standing unsupported feet together 3  From standing position, reach forward with outstretched arm 2  From standing position, pick up object from floor 1  From standing position, turn  and look behind over each shoulder 2  Turn 360 2  Standing unsupported, alternately place foot on step 1  Standing unsupported, one foot in front 0  Standing on one leg 0  Total:  29     GAIT: Flexed posture on the walker; decreased hip flexion    TODAY'S TREATMENT:                                                                                                                              DATE:  5/8 Bilateral er green 2x10  Bilateral Horizontal abduction 2x10 green Hip abduction 2x15 green  Seated March 2x15 reported some fatigue   LAQ 2x15 bilateral    5/1  SAQ 2x15  Hip abduction 2x15 green  Seated March 2x15 reported some fatigue   LAQ 2x15 bilateral  Hamstring curl green 2x10    Biceps curls 3x10 2lbs  Shoulder press 3x10 2lbs   Narrow base eyes closed 2x30 sec hold   Tandem stance eyes open and closed 2x30 sec each   Foam pad Narrow BOS x20, 3x15sec eyes closed Standing heel raise 3x10   4/24 Nu-step 5 min  Hip abduction Blue band 3x10 STS x10  STS wit foam pad on feet 3x5 Foam pad Narrow BOS x20, 3x15sec eyes closed Standing heel raise 3x10  4/4 Sit to stands x10 Standing marches HR/TR Seated clams GTB Seated march Seated LAQ 3# 2x10ea  Gait: 378ft with FWW, no standing rest breaks   PATIENT EDUCATION:  Education details: reviewed HEP, symptom management,  Person educated: Patient Education method:  Explanation Education comprehension: verbalized understanding, returned demonstration, verbal cues required, tactile cues required, and needs further education  HOME EXERCISE PROGRAM: Has prior HEP  ASSESSMENT:  CLINICAL IMPRESSION: Therapy worked patient back into standing exercises today. He was advised to let therapy know if he becomes significantly fatigued. We focused more on balance activity. He required mod a for any eyes closed activity and min a for balance activity with the air-ex. Therapy will continue to progress    OBJECTIVE IMPAIRMENTS: Abnormal gait, decreased activity tolerance, decreased balance, decreased mobility, difficulty walking, decreased ROM, decreased strength, and pain.   ACTIVITY LIMITATIONS: carrying, lifting, bending, sitting, standing, squatting, stairs, transfers, and locomotion level  PARTICIPATION LIMITATIONS: meal prep, cleaning, laundry, driving, shopping, community activity, and yard work  PERSONAL FACTORS: Age and 1-2 comorbidities: right knee OA  are also affecting patient's functional outcome.   REHAB POTENTIAL: Good  CLINICAL DECISION MAKING: Evolving/moderate complexity  EVALUATION COMPLEXITY: Moderate   GOALS: Goals reviewed with patient? Yes  SHORT TERM GOALS: Target date: 01/02/2023   Patient will increase Berg scale by 7 point Baseline: Goal status: Not tested 4/24  2.  Patient will decrease 5 times sit to stand time by 5 seconds Baseline:  Goal status: Not tested 4/24  3.  Patient will increase gross bilateral lower extremity strength by 5 pounds Baseline:  Goal status: partially met, still in  progress 4/24  4.  Patient will be independent with baseline HEP Baseline:  Goal status: Met 4/24    LONG TERM GOALS: Target date: 01/23/2023    Patient will report no falls over 6-week stretch Baseline:  Goal status: Achieved  2.  Patient will self report improved balance when performing transfers Baseline:  Goal status:  work in progress 4/24  3.  Patient will ambulate 3000 feet without increased fatigue and with good balance with least restrictive assistive device Baseline:  Goal status: work in progress 2/24  PLAN:  PT FREQUENCY: 2x/week  PT DURATION: 6 weeks  PLANNED INTERVENTIONS: Therapeutic exercises, Therapeutic activity, Neuromuscular re-education, Balance training, Gait training, Patient/Family education, Self Care, Joint mobilization, Stair training, DME instructions, Aquatic Therapy, Cryotherapy, Moist heat, Ultrasound, and Manual therapy  PLAN FOR NEXT SESSION:  Patient is performing home exercises at home.  Main focus will be on improving balance. Consider hurdles or step ups. Consider progression into gym exercises if tolerated.      Lorayne Bender PT DPT  02/07/2023   I

## 2023-02-19 ENCOUNTER — Ambulatory Visit (INDEPENDENT_AMBULATORY_CARE_PROVIDER_SITE_OTHER): Payer: Medicare Other | Admitting: Nurse Practitioner

## 2023-02-19 ENCOUNTER — Encounter: Payer: Self-pay | Admitting: Nurse Practitioner

## 2023-02-19 VITALS — BP 110/76 | HR 93 | Temp 97.3°F | Ht 71.0 in | Wt 202.0 lb

## 2023-02-19 DIAGNOSIS — G319 Degenerative disease of nervous system, unspecified: Secondary | ICD-10-CM

## 2023-02-19 DIAGNOSIS — M159 Polyosteoarthritis, unspecified: Secondary | ICD-10-CM

## 2023-02-19 DIAGNOSIS — I4821 Permanent atrial fibrillation: Secondary | ICD-10-CM

## 2023-02-19 DIAGNOSIS — G4700 Insomnia, unspecified: Secondary | ICD-10-CM | POA: Diagnosis not present

## 2023-02-19 DIAGNOSIS — R600 Localized edema: Secondary | ICD-10-CM

## 2023-02-19 NOTE — Progress Notes (Signed)
Careteam: Patient Care Team: Sharon Seller, NP as PCP - General (Geriatric Medicine) Jodelle Red, MD as PCP - Cardiology (Cardiology) Anette Riedel, MD as Rounding Team (Internal Medicine)  PLACE OF SERVICE:  [facility]  Advanced Directive information    Allergies  Allergen Reactions   Protonix [Pantoprazole] Other (See Comments)    Hallucinations   Codeine Other (See Comments)    constpation   Ferrous Sulfate Itching and Swelling    Chief Complaint  Patient presents with   Medical Management of Chronic Issues    6 month follow-up. Discuss need for td/tdap and covid boosters. Check ears for fullness. Headaches and pain off/on from top of head down to lower back. Patient c/o loose stools.  Here with daughter in law Winn Jock      HPI: Patient is a 89 y.o. male for routine follow up.   Reports he gets a lot of wax build up and wants this rechecked today.   OA- feet and joints ached, had trouble sleeping. Better now.  Reports feet get heavy. Reports pain overall is better  He is taking tylenol 1500 mg at night. Reports it is a waste of time taking 2.  Reports sometimes he wakes up every hour.   Occasionally will have diarrhea.- feels like it was something he ate.  Generally uses metamucil and well controlled.   Legs swelling, question if he needs to go up on his lasix. Low sodium diet. Does prop his feet up when sitting.   Reports memory has been doing great.   Review of Systems:  Review of Systems  Constitutional:  Negative for chills, fever and weight loss.  HENT:  Negative for tinnitus.   Respiratory:  Negative for cough, sputum production and shortness of breath.   Cardiovascular:  Negative for chest pain, palpitations and leg swelling.  Gastrointestinal:  Negative for abdominal pain, constipation, diarrhea and heartburn.  Genitourinary:  Negative for dysuria, frequency and urgency.  Musculoskeletal:  Positive for joint pain and myalgias.  Negative for back pain and falls.  Skin: Negative.   Neurological:  Negative for dizziness and headaches.  Psychiatric/Behavioral:  Negative for depression and memory loss. The patient has insomnia.     Past Medical History:  Diagnosis Date   Atrial fibrillation [facility])    Atrial fibrillation, chronic (HCC) 03/13/2008   Qualifier: Diagnosis of  By: Amador Cunas  MD, Janett Labella    Diverticulitis    Esophageal dysmotility    Esophageal dysphagia 07/29/2018   Family history of diabetes insipidus    FHx: colon cancer    H. pylori infection    Hiatal hernia    History of colonic polyps    Hyperlipidemia    Hypertension    IDA (iron deficiency anemia)    Obesity    Palpitation    Peptic ulcer disease    Presbyesophagus    Schatzki's ring    Past Surgical History:  Procedure Laterality Date   2-D Echocardiogram  November 2009, 2011   Anal Fistula Repair     BALLOON DILATION N/A 01/17/2022   Procedure: Rubye Beach;  Surgeon: Lynann Bologna, MD;  Location: [facility];  Service: Gastroenterology;  Laterality: N/A;   BIOPSY  07/14/2020   Procedure: BIOPSY;  Surgeon: Beverley Fiedler, MD;  Location: [facility] ENDOSCOPY;  Service: Gastroenterology;;   BIOPSY  01/17/2022   Procedure: BIOPSY;  Surgeon: Lynann Bologna, MD;  Location: [facility] ENDOSCOPY;  Service: Gastroenterology;;   CARDIAC VALVE SURGERY  1996   status  post balloon valvuloplasty at [facility]   CATARACT EXTRACTION  2004   COLONOSCOPY  December 2005   ESOPHAGOGASTRODUODENOSCOPY  07/14/2020   ESOPHAGOGASTRODUODENOSCOPY N/A 01/17/2022   Procedure: ESOPHAGOGASTRODUODENOSCOPY (EGD);  Surgeon: Lynann Bologna, MD;  Location: [facility];  Service: Gastroenterology;  Laterality: N/A;   ESOPHAGOGASTRODUODENOSCOPY (EGD) WITH PROPOFOL N/A 07/14/2020   Procedure: ESOPHAGOGASTRODUODENOSCOPY (EGD) WITH PROPOFOL;  Surgeon: Beverley Fiedler, MD;  Location: [facility] ENDOSCOPY;  Service: Gastroenterology;  Laterality: N/A;   ETT  1991   Negative   HEMOSTASIS CLIP  PLACEMENT  07/14/2020   HEMOSTASIS CLIP PLACEMENT   HEMOSTASIS CLIP PLACEMENT  07/14/2020   Procedure: HEMOSTASIS CLIP PLACEMENT;  Surgeon: Beverley Fiedler, MD;  Location: [facility] ENDOSCOPY;  Service: Gastroenterology;;   KNEE ARTHROSCOPY     Right   TONSILLECTOMY     Social History:   reports that he has never smoked. He has been exposed to tobacco smoke. He has never used smokeless tobacco. He reports that he does not drink alcohol and does not use drugs.  Family History  Problem Relation Age of Onset   Cancer Mother    Heart Problems Father    Stroke Father    Heart attack Father    Breast cancer Sister    Stroke Brother    Heart Problems Brother    Esophageal cancer Neg Hx     Medications: Patient's Medications  New Prescriptions   No medications on file  Previous Medications   ACETAMINOPHEN (TYLENOL) 500 MG TABLET    Take 1,500 mg by mouth at bedtime. And 2 in the morning   FUROSEMIDE (LASIX) 20 MG TABLET    Take 1 tablet (20 mg total) by mouth 2 (two) times daily.   METAMUCIL FIBER PO    Take 3 tablets by mouth in the morning and at bedtime.   METOPROLOL TARTRATE (LOPRESSOR) 100 MG TABLET    Take 1 tablet by mouth twice daily.   MULTIPLE VITAMIN (MULTIVITAMIN) TABLET    Take 1 tablet by mouth daily.  Modified Medications   No medications on file  Discontinued Medications   No medications on file    Physical Exam:  Vitals:   02/19/23 1515  BP: 110/76  Pulse: 93  Temp: (!) 97.3 F (36.3 C)  TempSrc: Temporal  SpO2: 97%  Weight: 202 lb (91.6 kg)  Height: 5\' 11"  (1.803 m)   Body mass index is 28.17 kg/m. Wt Readings from Last 3 Encounters:  02/19/23 202 lb (91.6 kg)  01/16/23 209 lb 9.6 oz (95.1 kg)  01/05/23 213 lb 12.8 oz (97 kg)    Physical Exam Constitutional:      General: He is not in acute distress.    Appearance: He is well-developed. He is not diaphoretic.  HENT:     Head: Normocephalic and atraumatic.     Right Ear: External ear normal. There is  no impacted cerumen.     Left Ear: External ear normal. There is no impacted cerumen.     Mouth/Throat:     Pharynx: No oropharyngeal exudate.  Eyes:     Conjunctiva/sclera: Conjunctivae normal.     Pupils: Pupils are equal, round, and reactive to light.  Cardiovascular:     Rate and Rhythm: Normal rate and regular rhythm.     Heart sounds: Normal heart sounds.  Pulmonary:     Effort: Pulmonary effort is normal.     Breath sounds: Normal breath sounds.  Abdominal:     General: Bowel sounds are normal.  Palpations: Abdomen is soft.  Musculoskeletal:        General: No tenderness.     Cervical back: Normal range of motion and neck supple.     Right lower leg: No edema.     Left lower leg: No edema.  Skin:    General: Skin is warm and dry.  Neurological:     Mental Status: He is alert and oriented to person, place, and time.     Labs reviewed: Basic Metabolic Panel: Recent Labs    07/11/22 0933 10/04/22 0944 10/21/22 0945  NA 139 143 140  K 5.0 4.0 4.0  CL 103 105 105  CO2 23 23 29   GLUCOSE 83 102* 96  BUN 23 23 24*  CREATININE 0.82 0.91 0.86  CALCIUM 9.2 9.0 8.7*   Liver Function Tests: Recent Labs    07/11/22 0933  AST 23  ALT 19  ALKPHOS 98  BILITOT 0.5  PROT 6.0  ALBUMIN 4.0   No results for input(s): "LIPASE", "AMYLASE" in the last 8760 hours. No results for input(s): "AMMONIA" in the last 8760 hours. CBC: Recent Labs    07/11/22 0933 10/04/22 0944 10/21/22 0945  WBC CANCELED 9.9 9.5  NEUTROABS CANCELED  --   --   HGB CANCELED 16.3 16.2  HCT CANCELED 47.3 48.0  MCV CANCELED 96 97.6  PLT CANCELED 209 181   Lipid Panel: Recent Labs    07/11/22 0933  CHOL 131  HDL 42  LDLCALC 70  TRIG 101  CHOLHDL 3.1   TSH: No results for input(s): "TSH" in the last 8760 hours. A1C: Lab Results  Component Value Date   HGBA1C 5.6 07/11/2022     Assessment/Plan 1. Permanent atrial fibrillation (HCC) -rate controlled on metoprolol. Not on  anticoagulation due to hx of GI bleed - Complete Metabolic Panel with eGFR  2. Cerebral atrophy (HCC) -noted on imaging, memory stable at this time  3. Bilateral leg edema --encouraged to elevate legs above level of heart as tolerates, low sodium diet, compression hose as tolerates (on in am, off in pm) - Complete Metabolic Panel with eGFR  4. Osteoarthritis of multiple joints, unspecified osteoarthritis type -ongoing, continue PT, can use tylenol 1000 mg by mouth every 8 hours as needed pain. Not to exceed 1000 mg per dose  5. Insomnia, unspecified type -to use melatonin 1 mg qhs.   Return in about 6 months (around 08/22/2023).  Janene Harvey. Biagio Borg [facility] [phone]

## 2023-02-19 NOTE — Patient Instructions (Addendum)
Max dose of tylenol is 1000 mg at a time  To start melatonin 1 mg at bedtime to help sleep  -encouraged to elevate legs above level of heart as tolerates, low sodium diet, compression hose as tolerates (on in am, off in pm) .

## 2023-02-20 LAB — COMPLETE METABOLIC PANEL WITH GFR
AG Ratio: 1.8 (calc) (ref 1.0–2.5)
ALT: 18 U/L (ref 9–46)
AST: 23 U/L (ref 10–35)
Albumin: 4.3 g/dL (ref 3.6–5.1)
Alkaline phosphatase (APISO): 75 U/L (ref 35–144)
BUN/Creatinine Ratio: 28 (calc) — ABNORMAL HIGH (ref 6–22)
BUN: 27 mg/dL — ABNORMAL HIGH (ref 7–25)
CO2: 27 mmol/L (ref 20–32)
Calcium: 9.5 mg/dL (ref 8.6–10.3)
Chloride: 105 mmol/L (ref 98–110)
Creat: 0.97 mg/dL (ref 0.70–1.22)
Globulin: 2.4 g/dL (calc) (ref 1.9–3.7)
Glucose, Bld: 117 mg/dL — ABNORMAL HIGH (ref 65–99)
Potassium: 5 mmol/L (ref 3.5–5.3)
Sodium: 141 mmol/L (ref 135–146)
Total Bilirubin: 0.5 mg/dL (ref 0.2–1.2)
Total Protein: 6.7 g/dL (ref 6.1–8.1)
eGFR: 73 mL/min/{1.73_m2} (ref >=60)

## 2023-02-23 ENCOUNTER — Encounter (HOSPITAL_BASED_OUTPATIENT_CLINIC_OR_DEPARTMENT_OTHER): Payer: Self-pay

## 2023-02-23 ENCOUNTER — Ambulatory Visit (HOSPITAL_BASED_OUTPATIENT_CLINIC_OR_DEPARTMENT_OTHER): Payer: Medicare Other

## 2023-02-23 DIAGNOSIS — R2681 Unsteadiness on feet: Secondary | ICD-10-CM | POA: Diagnosis not present

## 2023-02-23 DIAGNOSIS — R2689 Other abnormalities of gait and mobility: Secondary | ICD-10-CM | POA: Diagnosis not present

## 2023-02-23 DIAGNOSIS — M6281 Muscle weakness (generalized): Secondary | ICD-10-CM

## 2023-02-23 NOTE — Therapy (Signed)
OUTPATIENT PHYSICAL THERAPY LOWER EXTREMITY Treatment /    Patient Name: Glen [name] MRN: [id number] DOB:12/10/1927, 89 y.o., male Today's Date: 02/23/2023  END OF SESSION:  PT End of Session - 02/23/23 1345     Visit Number 6    Number of Visits 12    Date for PT Re-Evaluation 03/08/23    PT Start Time 1346    PT Stop Time 1427    PT Time Calculation (min) 41 min    Equipment Utilized During Treatment Gait belt    Activity Tolerance Patient tolerated treatment well    Behavior During Therapy WFL for tasks assessed/performed             Past Medical History:  Diagnosis Date   Atrial fibrillation (HCC)    Atrial fibrillation, chronic (HCC) 03/13/2008   Qualifier: Diagnosis of  By: Amador Cunas  MD, Janett Labella    Diverticulitis    Esophageal dysmotility    Esophageal dysphagia 07/29/2018   Family history of diabetes insipidus    FHx: colon cancer    H. pylori infection    Hiatal hernia    History of colonic polyps    Hyperlipidemia    Hypertension    IDA (iron deficiency anemia)    Obesity    Palpitation    Peptic ulcer disease    Presbyesophagus    Schatzki's ring    Past Surgical History:  Procedure Laterality Date   2-D Echocardiogram  November 2009, 2011   Anal Fistula Repair     BALLOON DILATION N/A 01/17/2022   Procedure: Rubye Beach;  Surgeon: Lynann Bologna, MD;  Location: [facility];  Service: Gastroenterology;  Laterality: N/A;   BIOPSY  07/14/2020   Procedure: BIOPSY;  Surgeon: Beverley Fiedler, MD;  Location: [facility] ENDOSCOPY;  Service: Gastroenterology;;   BIOPSY  01/17/2022   Procedure: BIOPSY;  Surgeon: Lynann Bologna, MD;  Location: [facility] ENDOSCOPY;  Service: Gastroenterology;;   CARDIAC VALVE SURGERY  1996   status post balloon valvuloplasty at [facility]   CATARACT EXTRACTION  2004   COLONOSCOPY  December 2005   ESOPHAGOGASTRODUODENOSCOPY  07/14/2020   ESOPHAGOGASTRODUODENOSCOPY N/A 01/17/2022   Procedure: ESOPHAGOGASTRODUODENOSCOPY (EGD);  Surgeon:  Lynann Bologna, MD;  Location: [facility];  Service: Gastroenterology;  Laterality: N/A;   ESOPHAGOGASTRODUODENOSCOPY (EGD) WITH PROPOFOL N/A 07/14/2020   Procedure: ESOPHAGOGASTRODUODENOSCOPY (EGD) WITH PROPOFOL;  Surgeon: Beverley Fiedler, MD;  Location: [facility] ENDOSCOPY;  Service: Gastroenterology;  Laterality: N/A;   ETT  1991   Negative   HEMOSTASIS CLIP PLACEMENT  07/14/2020   HEMOSTASIS CLIP PLACEMENT   HEMOSTASIS CLIP PLACEMENT  07/14/2020   Procedure: HEMOSTASIS CLIP PLACEMENT;  Surgeon: Beverley Fiedler, MD;  Location: [facility] ENDOSCOPY;  Service: Gastroenterology;;   KNEE ARTHROSCOPY     Right   TONSILLECTOMY     Patient Active Problem List   Diagnosis Date Noted   History of GI bleed 10/04/2022   Permanent atrial fibrillation (HCC) 10/04/2022   Physical deconditioning 01/16/2022   Dehydration 01/16/2022   Peptic ulcer disease    Anemia, unspecified 07/16/2020   Fall 07/13/2020   Osteoarthritis 06/25/2019   Onychomycosis 07/29/2018   Peripheral edema 11/18/2015   Impaired glucose tolerance 04/19/2015   Chronic atrial fibrillation (HCC) 11/07/2010   Essential hypertension 01/20/2009   Dyslipidemia 07/31/2007   History of colonic polyps 07/31/2007   Progress Note Reporting Period 12/11/2022 to 01/24/2023  See note below for Objective Data and Assessment of Progress/Goals.      PCP: Emi Holes NP  REFERRING PROVIDER: Edd Fabian NP  REFERRING DIAG:  Diagnosis  R53.1 (ICD-10-CM) - General weakness   THERAPY DIAG:  Muscle weakness (generalized)  Unsteadiness on feet  Other abnormalities of gait and mobility  Rationale for Evaluation and Treatment: Rehabilitation  ONSET DATE: fall on 10/21/2022   SUBJECTIVE:   SUBJECTIVE STATEMENT: Pt reports he has been trying to do exercises at home everyday.  Tuned 93 yesterday.   PERTINENT HISTORY: PMH: HTN, Afib, OA, HLD, hernia, IDA, neoplasm of prostate, cardiac valve surgery, knee arthroscopy  PAIN:  Are you  having pain? Yes: NPRS scale: 3/10 Pain location: bilateral LE  Pain description: aching  Aggravating factors: patient retains fluid/ standing and walking  Relieving factors: hurts less when they are not swollen   PRECAUTIONS: Fall  WEIGHT BEARING RESTRICTIONS: No  FALLS:  Has patient fallen in last 6 months? No  LIVING ENVIRONMENT:  Lives with: lives with their spouse but son is 10 minutes away  Lives in: House/apartment Stairs: Yes: Internal: 4 steps; can reach both does not have to go upstairs inside the home  Has following equipment at home: [employer] Humphre[employer] - 2 wheeled and [employer] - 4 wheeled, shower chair, grab bars  OCCUPATION: retired   Presenter, broadcasting and rec: collects coins   PLOF: Independent with basic ADLs  PATIENT GOALS:   No more falls   NEXT MD VISIT: Nothing scheduled  OBJECTIVE:   Vitals: HR 98  B/P  121/84  DIAGNOSTIC FINDINGS:  Following fall x-ray of right knee:IMPRESSION: 1. No acute findings. 2. Severe lateral compartment osteoarthritis. PATIENT SURVEYS:    COGNITION: Overall cognitive status: Within functional limits for tasks assessed     SENSATION: WFL  EDEMA:  Per visual inspection bilateral edema  POSTURE:    PALPATION: Mild tenderness to palpation in right lateral knee  LOWER EXTREMITY ROM:  Active ROM Right eval Left eval  Hip flexion    Hip extension    Hip abduction    Hip adduction    Hip internal rotation    Hip external rotation    Knee flexion Mild pain with right knee flexion   Knee extension    Ankle dorsiflexion    Ankle plantarflexion    Ankle inversion    Ankle eversion     (Blank rows = not tested)  LOWER EXTREMITY MMT:  MMT Right eval Left eval Right  Left  Hip flexion 28.7 32.2 21.7 18.5  Hip extension      Hip abduction 26.0 22.0 31.2 28.4  Hip adduction      Hip internal rotation      Hip external rotation      Knee flexion      Knee extension 36.9 33.6 45 48  Ankle dorsiflexion      Ankle  plantarflexion      Ankle inversion      Ankle eversion       (Blank rows = not tested)   FUNCTIONAL TESTS:  5x sit to stand 22 seconds   BERG Balance Test          Date:   Sit to Stand 3  Standing unsupported 3  Sitting with back unsupported but feet supported 4  Stand to sit  3  Transfers  2  Standing unsupported with eyes closed 3  Standing unsupported feet together 3  From standing position, reach forward with outstretched arm 2  From standing position, pick up object from floor 1  From standing position, turn and look behind over each  shoulder 2  Turn 360 2  Standing unsupported, alternately place foot on step 1  Standing unsupported, one foot in front 0  Standing on one leg 0  Total:  29     GAIT: Flexed posture on the walker; decreased hip flexion    TODAY'S TREATMENT:                                                                                                                              DATE:   5/24 Gait with rollator 367ft.  Nu step level 3 x61min LAQ 1# 3x15 ea Hip abduction 2x15 green  Seated March 1# 2x15 Sit to stands 2x5 Standing heel raise 3x10 Standing march 2x10 Romberg with head turns/nods 20sec x2ea Modified tandem stance 30sec x2ea  5/8 Bilateral er green 2x10  Bilateral Horizontal abduction 2x10 green Hip abduction 2x15 green  Seated March 2x15 reported some fatigue   LAQ 2x15 bilateral    5/1  SAQ 2x15  Hip abduction 2x15 green  Seated March 2x15 reported some fatigue   LAQ 2x15 bilateral  Hamstring curl green 2x10    Biceps curls 3x10 2lbs  Shoulder press 3x10 2lbs   Narrow base eyes closed 2x30 sec hold   Tandem stance eyes open and closed 2x30 sec each   Foam pad Narrow BOS x20, 3x15sec eyes closed Standing heel raise 3x10   4/24 Nu-step 5 min  Hip abduction Blue band 3x10 STS x10  STS wit foam pad on feet 3x5 Foam pad Narrow BOS x20, 3x15sec eyes closed Standing heel raise 3x10  4/4 Sit to stands  x10 Standing marches HR/TR Seated clams GTB Seated march Seated LAQ 3# 2x10ea  Gait: 369ft with FWW, no standing rest breaks   PATIENT EDUCATION:  Education details: reviewed HEP, symptom management,  Person educated: Patient Education method: Explanation Education comprehension: verbalized understanding, returned demonstration, verbal cues required, tactile cues required, and needs further education  HOME EXERCISE PROGRAM: Has prior HEP  ASSESSMENT:  CLINICAL IMPRESSION: Pt requires CGA with gait belt for romberg stance with head turns/nods. Required SBA and intermittent CGA with modified tandem stance.  He did report some R posterior knee discomfort after sit to stands, but no significant pain. Unable to stand up without use of hands and requires SBA due to balance. Pt reported mild fatigue with exercise. Pt required seated breaks during standing tasks.    OBJECTIVE IMPAIRMENTS: Abnormal gait, decreased activity tolerance, decreased balance, decreased mobility, difficulty walking, decreased ROM, decreased strength, and pain.   ACTIVITY LIMITATIONS: carrying, lifting, bending, sitting, standing, squatting, stairs, transfers, and locomotion level  PARTICIPATION LIMITATIONS: meal prep, cleaning, laundry, driving, shopping, community activity, and yard work  PERSONAL FACTORS: Age and 1-2 comorbidities: right knee OA  are also affecting patient's functional outcome.   REHAB POTENTIAL: Good  CLINICAL DECISION MAKING: Evolving/moderate complexity  EVALUATION COMPLEXITY: Moderate   GOALS: Goals reviewed with patient? Yes  SHORT TERM GOALS: Target date: 01/02/2023  Patient will increase Berg scale by 7 point Baseline: Goal status: Not tested 4/24  2.  Patient will decrease 5 times sit to stand time by 5 seconds Baseline:  Goal status: Not tested 4/24  3.  Patient will increase gross bilateral lower extremity strength by 5 pounds Baseline:  Goal status: partially met,  still in progress 4/24  4.  Patient will be independent with baseline HEP Baseline:  Goal status: Met 4/24    LONG TERM GOALS: Target date: 01/23/2023    Patient will report no falls over 6-week stretch Baseline:  Goal status: Achieved  2.  Patient will self report improved balance when performing transfers Baseline:  Goal status: work in progress 4/24  3.  Patient will ambulate 3000 feet without increased fatigue and with good balance with least restrictive assistive device Baseline:  Goal status: work in progress 2/24  PLAN:  PT FREQUENCY: 2x/week  PT DURATION: 6 weeks  PLANNED INTERVENTIONS: Therapeutic exercises, Therapeutic activity, Neuromuscular re-education, Balance training, Gait training, Patient/Family education, Self Care, Joint mobilization, Stair training, DME instructions, Aquatic Therapy, Cryotherapy, Moist heat, Ultrasound, and Manual therapy  PLAN FOR NEXT SESSION:  Patient is performing home exercises at home.  Main focus will be on improving balance. Consider hurdles or step ups. Consider progression into gym exercises if tolerated.      Riki Altes, PTA  02/23/2023   I

## 2023-03-02 ENCOUNTER — Ambulatory Visit (HOSPITAL_BASED_OUTPATIENT_CLINIC_OR_DEPARTMENT_OTHER): Payer: Medicare Other

## 2023-03-02 ENCOUNTER — Encounter (HOSPITAL_BASED_OUTPATIENT_CLINIC_OR_DEPARTMENT_OTHER): Payer: Self-pay

## 2023-03-02 DIAGNOSIS — R2689 Other abnormalities of gait and mobility: Secondary | ICD-10-CM

## 2023-03-02 DIAGNOSIS — M6281 Muscle weakness (generalized): Secondary | ICD-10-CM

## 2023-03-02 DIAGNOSIS — R2681 Unsteadiness on feet: Secondary | ICD-10-CM

## 2023-03-02 NOTE — Therapy (Signed)
OUTPATIENT PHYSICAL THERAPY LOWER EXTREMITY Treatment /    Patient Name: Glen [name] MRN: [id number] DOB:Sep 18, 1928, 89 y.o., male Today's Date: 03/02/2023  END OF SESSION:  PT End of Session - 03/02/23 1531     Visit Number 7    Number of Visits 12    Date for PT Re-Evaluation 03/08/23    PT Start Time 1518    PT Stop Time 1556    PT Time Calculation (min) 38 min    Equipment Utilized During Treatment Gait belt    Activity Tolerance Patient tolerated treatment well    Behavior During Therapy WFL for tasks assessed/performed              Past Medical History:  Diagnosis Date   Atrial fibrillation (HCC)    Atrial fibrillation, chronic (HCC) 03/13/2008   Qualifier: Diagnosis of  By: Amador Cunas  MD, Janett Labella    Diverticulitis    Esophageal dysmotility    Esophageal dysphagia 07/29/2018   Family history of diabetes insipidus    FHx: colon cancer    H. pylori infection    Hiatal hernia    History of colonic polyps    Hyperlipidemia    Hypertension    IDA (iron deficiency anemia)    Obesity    Palpitation    Peptic ulcer disease    Presbyesophagus    Schatzki's ring    Past Surgical History:  Procedure Laterality Date   2-D Echocardiogram  November 2009, 2011   Anal Fistula Repair     BALLOON DILATION N/A 01/17/2022   Procedure: Rubye Beach;  Surgeon: Lynann Bologna, MD;  Location: [facility];  Service: Gastroenterology;  Laterality: N/A;   BIOPSY  07/14/2020   Procedure: BIOPSY;  Surgeon: Beverley Fiedler, MD;  Location: [facility] ENDOSCOPY;  Service: Gastroenterology;;   BIOPSY  01/17/2022   Procedure: BIOPSY;  Surgeon: Lynann Bologna, MD;  Location: [facility] ENDOSCOPY;  Service: Gastroenterology;;   CARDIAC VALVE SURGERY  1996   status post balloon valvuloplasty at [facility]   CATARACT EXTRACTION  2004   COLONOSCOPY  December 2005   ESOPHAGOGASTRODUODENOSCOPY  07/14/2020   ESOPHAGOGASTRODUODENOSCOPY N/A 01/17/2022   Procedure: ESOPHAGOGASTRODUODENOSCOPY (EGD);  Surgeon:  Lynann Bologna, MD;  Location: [facility];  Service: Gastroenterology;  Laterality: N/A;   ESOPHAGOGASTRODUODENOSCOPY (EGD) WITH PROPOFOL N/A 07/14/2020   Procedure: ESOPHAGOGASTRODUODENOSCOPY (EGD) WITH PROPOFOL;  Surgeon: Beverley Fiedler, MD;  Location: [facility] ENDOSCOPY;  Service: Gastroenterology;  Laterality: N/A;   ETT  1991   Negative   HEMOSTASIS CLIP PLACEMENT  07/14/2020   HEMOSTASIS CLIP PLACEMENT   HEMOSTASIS CLIP PLACEMENT  07/14/2020   Procedure: HEMOSTASIS CLIP PLACEMENT;  Surgeon: Beverley Fiedler, MD;  Location: [facility] ENDOSCOPY;  Service: Gastroenterology;;   KNEE ARTHROSCOPY     Right   TONSILLECTOMY     Patient Active Problem List   Diagnosis Date Noted   History of GI bleed 10/04/2022   Permanent atrial fibrillation (HCC) 10/04/2022   Physical deconditioning 01/16/2022   Dehydration 01/16/2022   Peptic ulcer disease    Anemia, unspecified 07/16/2020   Fall 07/13/2020   Osteoarthritis 06/25/2019   Onychomycosis 07/29/2018   Peripheral edema 11/18/2015   Impaired glucose tolerance 04/19/2015   Chronic atrial fibrillation (HCC) 11/07/2010   Essential hypertension 01/20/2009   Dyslipidemia 07/31/2007   History of colonic polyps 07/31/2007   Progress Note Reporting Period 12/11/2022 to 01/24/2023  See note below for Objective Data and Assessment of Progress/Goals.      PCP: Emi Holes NP  REFERRING PROVIDER: Edd Fabian NP  REFERRING DIAG:  Diagnosis  R53.1 (ICD-10-CM) - General weakness   THERAPY DIAG:  Unsteadiness on feet  Muscle weakness (generalized)  Other abnormalities of gait and mobility  Rationale for Evaluation and Treatment: Rehabilitation  ONSET DATE: fall on 10/21/2022   SUBJECTIVE:   SUBJECTIVE STATEMENT: Pt reports he has been trying to do exercises at home everyday.  Tuned 93 yesterday.   PERTINENT HISTORY: PMH: HTN, Afib, OA, HLD, hernia, IDA, neoplasm of prostate, cardiac valve surgery, knee arthroscopy  PAIN:  Are you  having pain? Yes: NPRS scale: 3/10 Pain location: bilateral LE  Pain description: aching  Aggravating factors: patient retains fluid/ standing and walking  Relieving factors: hurts less when they are not swollen   PRECAUTIONS: Fall  WEIGHT BEARING RESTRICTIONS: No  FALLS:  Has patient fallen in last 6 months? No  LIVING ENVIRONMENT:  Lives with: lives with their spouse but son is 10 minutes away  Lives in: House/apartment Stairs: Yes: Internal: 4 steps; can reach both does not have to go upstairs inside the home  Has following equipment at home: Dan Humphre[employer] - 2 wheeled and [employer] - 4 wheeled, shower chair, grab bars  OCCUPATION: retired   Presenter, broadcasting and rec: collects coins   PLOF: Independent with basic ADLs  PATIENT GOALS:   No more falls   NEXT MD VISIT: Nothing scheduled  OBJECTIVE:   Vitals: HR 98  B/P  121/84  DIAGNOSTIC FINDINGS:  Following fall x-ray of right knee:IMPRESSION: 1. No acute findings. 2. Severe lateral compartment osteoarthritis. PATIENT SURVEYS:    COGNITION: Overall cognitive status: Within functional limits for tasks assessed     SENSATION: WFL  EDEMA:  Per visual inspection bilateral edema  POSTURE:    PALPATION: Mild tenderness to palpation in right lateral knee  LOWER EXTREMITY ROM:  Active ROM Right eval Left eval  Hip flexion    Hip extension    Hip abduction    Hip adduction    Hip internal rotation    Hip external rotation    Knee flexion Mild pain with right knee flexion   Knee extension    Ankle dorsiflexion    Ankle plantarflexion    Ankle inversion    Ankle eversion     (Blank rows = not tested)  LOWER EXTREMITY MMT:  MMT Right eval Left eval Right  Left  Hip flexion 28.7 32.2 21.7 18.5  Hip extension      Hip abduction 26.0 22.0 31.2 28.4  Hip adduction      Hip internal rotation      Hip external rotation      Knee flexion      Knee extension 36.9 33.6 45 48  Ankle dorsiflexion      Ankle  plantarflexion      Ankle inversion      Ankle eversion       (Blank rows = not tested)   FUNCTIONAL TESTS:  5x sit to stand 22 seconds   BERG Balance Test          Date:   Sit to Stand 3  Standing unsupported 3  Sitting with back unsupported but feet supported 4  Stand to sit  3  Transfers  2  Standing unsupported with eyes closed 3  Standing unsupported feet together 3  From standing position, reach forward with outstretched arm 2  From standing position, pick up object from floor 1  From standing position, turn and look behind over each  shoulder 2  Turn 360 2  Standing unsupported, alternately place foot on step 1  Standing unsupported, one foot in front 0  Standing on one leg 0  Total:  29     GAIT: Flexed posture on the walker; decreased hip flexion    TODAY'S TREATMENT:                                                                                                                              DATE:   5/31 Gait with rollator 374ft.  Nu step level 3 x92min LAQ 1.5 # 3x10ea  Hip abduction 2x30 green  Seated March 1.5# 2x15 Sit to stands 2x5 Standing heel raise 3x10 Standing march 2x10 Romberg with head turns/nods 20sec x2ea Modified tandem stance 30sec x2ea   5/24 Gait with rollator 311ft.  Nu step level 3 x69min LAQ 1# 3x15 ea Hip abduction 2x15 green  Seated March 1# 2x15 Sit to stands 2x5 Standing heel raise 3x10 Standing march 2x10 Romberg with head turns/nods 20sec x2ea Modified tandem stance 30sec x2ea  5/8 Bilateral er green 2x10  Bilateral Horizontal abduction 2x10 green Hip abduction 2x15 green  Seated March 2x15 reported some fatigue   LAQ 2x15 bilateral    5/1  SAQ 2x15  Hip abduction 2x15 green  Seated March 2x15 reported some fatigue   LAQ 2x15 bilateral  Hamstring curl green 2x10    Biceps curls 3x10 2lbs  Shoulder press 3x10 2lbs   Narrow base eyes closed 2x30 sec hold   Tandem stance eyes open and closed 2x30 sec  each   Foam pad Narrow BOS x20, 3x15sec eyes closed Standing heel raise 3x10    PATIENT EDUCATION:  Education details: reviewed HEP, symptom management,  Person educated: Patient Education method: Explanation Education comprehension: verbalized understanding, returned demonstration, verbal cues required, tactile cues required, and needs further education  HOME EXERCISE PROGRAM: Has prior HEP  ASSESSMENT:  CLINICAL IMPRESSION: Pt requires cues for proper technique with sit to stands. SBA required with this due to mild unsteadiness upon standing.  Patient denied leg pain with sit to stands.  He did complain of tightness in bilateral lower extremities with standing exercise.  Able to progress with increased weight and repetitions with exercises.  No significant fatigue noted throughout session. Mild unsteadiness with balance tasks, though appears improved from last session. SBA required with intermittent CGA using gait belt.     OBJECTIVE IMPAIRMENTS: Abnormal gait, decreased activity tolerance, decreased balance, decreased mobility, difficulty walking, decreased ROM, decreased strength, and pain.   ACTIVITY LIMITATIONS: carrying, lifting, bending, sitting, standing, squatting, stairs, transfers, and locomotion level  PARTICIPATION LIMITATIONS: meal prep, cleaning, laundry, driving, shopping, community activity, and yard work  PERSONAL FACTORS: Age and 1-2 comorbidities: right knee OA  are also affecting patient's functional outcome.   REHAB POTENTIAL: Good  CLINICAL DECISION MAKING: Evolving/moderate complexity  EVALUATION COMPLEXITY: Moderate   GOALS: Goals reviewed with patient? Yes  SHORT TERM GOALS:  Target date: 01/02/2023   Patient will increase Berg scale by 7 point Baseline: Goal status: Not tested 4/24  2.  Patient will decrease 5 times sit to stand time by 5 seconds Baseline:  Goal status: Not tested 4/24  3.  Patient will increase gross bilateral lower  extremity strength by 5 pounds Baseline:  Goal status: partially met, still in progress 4/24  4.  Patient will be independent with baseline HEP Baseline:  Goal status: Met 4/24    LONG TERM GOALS: Target date: 01/23/2023    Patient will report no falls over 6-week stretch Baseline:  Goal status: Achieved  2.  Patient will self report improved balance when performing transfers Baseline:  Goal status: work in progress 4/24  3.  Patient will ambulate 3000 feet without increased fatigue and with good balance with least restrictive assistive device Baseline:  Goal status: work in progress 2/24  PLAN:  PT FREQUENCY: 2x/week  PT DURATION: 6 weeks  PLANNED INTERVENTIONS: Therapeutic exercises, Therapeutic activity, Neuromuscular re-education, Balance training, Gait training, Patient/Family education, Self Care, Joint mobilization, Stair training, DME instructions, Aquatic Therapy, Cryotherapy, Moist heat, Ultrasound, and Manual therapy  PLAN FOR NEXT SESSION:  Patient is performing home exercises at home.  Main focus will be on improving balance. Consider hurdles or step ups. Consider progression into gym exercises if tolerated.      Riki Altes, PTA  03/02/2023

## 2023-03-08 ENCOUNTER — Ambulatory Visit (HOSPITAL_BASED_OUTPATIENT_CLINIC_OR_DEPARTMENT_OTHER): Payer: Medicare Other | Attending: Nurse Practitioner | Admitting: Physical Therapy

## 2023-03-08 DIAGNOSIS — R2681 Unsteadiness on feet: Secondary | ICD-10-CM | POA: Diagnosis not present

## 2023-03-08 DIAGNOSIS — M6281 Muscle weakness (generalized): Secondary | ICD-10-CM | POA: Diagnosis not present

## 2023-03-08 DIAGNOSIS — R2689 Other abnormalities of gait and mobility: Secondary | ICD-10-CM | POA: Diagnosis not present

## 2023-03-08 NOTE — Therapy (Signed)
OUTPATIENT PHYSICAL THERAPY LOWER EXTREMITY Treatment /    Patient Name: Glen [name] MRN: [id number] DOB:[birth year], 89 y.o., male Today's Date: 03/08/2023  END OF SESSION:  PT End of Session - 03/08/23 1516     Visit Number 8    Number of Visits 12    Date for PT Re-Evaluation 03/08/23    PT Start Time 1345    PT Stop Time 1428    PT Time Calculation (min) 43 min    Activity Tolerance Patient tolerated treatment well    Behavior During Therapy [facility] for tasks assessed/performed               Past Medical History:  Diagnosis Date   Atrial fibrillation (HCC)    Atrial fibrillation, chronic (HCC) 03/13/2008   Qualifier: Diagnosis of  By: Amador Cunas  MD, Janett Labella    Diverticulitis    Esophageal dysmotility    Esophageal dysphagia 07/29/2018   Family history of diabetes insipidus    FHx: colon cancer    H. pylori infection    Hiatal hernia    History of colonic polyps    Hyperlipidemia    Hypertension    IDA (iron deficiency anemia)    Obesity    Palpitation    Peptic ulcer disease    Presbyesophagus    Schatzki's ring    Past Surgical History:  Procedure Laterality Date   2-D Echocardiogram  November 2009, 2011   Anal Fistula Repair     BALLOON DILATION N/A 01/17/2022   Procedure: Rubye [place];  Surgeon: Lynann Bologna, MD;  Location: [facility];  Service: Gastroenterology;  Laterality: N/A;   BIOPSY  07/14/2020   Procedure: BIOPSY;  Surgeon: Beverley Fiedler, MD;  Location: [facility] ENDOSCOPY;  Service: Gastroenterology;;   BIOPSY  01/17/2022   Procedure: BIOPSY;  Surgeon: Lynann Bologna, MD;  Location: [facility] ENDOSCOPY;  Service: Gastroenterology;;   CARDIAC VALVE SURGERY  1996   status post balloon valvuloplasty at [facility]   CATARACT EXTRACTION  2004   COLONOSCOPY  December 2005   ESOPHAGOGASTRODUODENOSCOPY  07/14/2020   ESOPHAGOGASTRODUODENOSCOPY N/A 01/17/2022   Procedure: ESOPHAGOGASTRODUODENOSCOPY (EGD);  Surgeon: Lynann Bologna, MD;  Location: [facility];   Service: Gastroenterology;  Laterality: N/A;   ESOPHAGOGASTRODUODENOSCOPY (EGD) WITH PROPOFOL N/A 07/14/2020   Procedure: ESOPHAGOGASTRODUODENOSCOPY (EGD) WITH PROPOFOL;  Surgeon: Beverley Fiedler, MD;  Location: [facility] ENDOSCOPY;  Service: Gastroenterology;  Laterality: N/A;   ETT  1991   Negative   HEMOSTASIS CLIP PLACEMENT  07/14/2020   HEMOSTASIS CLIP PLACEMENT   HEMOSTASIS CLIP PLACEMENT  07/14/2020   Procedure: HEMOSTASIS CLIP PLACEMENT;  Surgeon: Beverley Fiedler, MD;  Location: [facility] ENDOSCOPY;  Service: Gastroenterology;;   KNEE ARTHROSCOPY     Right   TONSILLECTOMY     Patient Active Problem List   Diagnosis Date Noted   History of GI bleed 10/04/2022   Permanent atrial fibrillation (HCC) 10/04/2022   Physical deconditioning 01/16/2022   Dehydration 01/16/2022   Peptic ulcer disease    Anemia, unspecified 07/16/2020   Fall 07/13/2020   Osteoarthritis 06/25/2019   Onychomycosis 07/29/2018   Peripheral edema 11/18/2015   Impaired glucose tolerance 04/19/2015   Chronic atrial fibrillation (HCC) 11/07/2010   Essential hypertension 01/20/2009   Dyslipidemia 07/31/2007   History of colonic polyps 07/31/2007   Progress Note Reporting Period 01/24/2023 to 03/08/2023  See note below for Objective Data and Assessment of Progress/Goals.      PCP: Emi Holes NP    REFERRING PROVIDER: Edd Fabian NP  REFERRING DIAG:  Diagnosis  R53.1 (ICD-10-CM) - General weakness   THERAPY DIAG:  Unsteadiness on feet  Muscle weakness (generalized)  Other abnormalities of gait and mobility  Rationale for Evaluation and Treatment: Rehabilitation  ONSET DATE: fall on 10/21/2022   SUBJECTIVE:   SUBJECTIVE STATEMENT: Pt reports he has been trying to do exercises at home everyday.  Tuned 93 yesterday.   PERTINENT HISTORY: PMH: HTN, Afib, OA, HLD, hernia, IDA, neoplasm of prostate, cardiac valve surgery, knee arthroscopy  PAIN:  Are you having pain? Yes: NPRS scale: 3/10 Pain location:  bilateral LE  Pain description: aching  Aggravating factors: patient retains fluid/ standing and walking  Relieving factors: hurts less when they are not swollen   PRECAUTIONS: Fall  WEIGHT BEARING RESTRICTIONS: No  FALLS:  Has patient fallen in last 6 months? No  LIVING ENVIRONMENT:  Lives with: lives with their spouse but son is 10 minutes away  Lives in: House/apartment Stairs: Yes: Internal: 4 steps; can reach both does not have to go upstairs inside the home  Has following equipment at home: Dan Humphreys - 2 wheeled and [employer] - 4 wheeled, shower chair, grab bars  OCCUPATION: retired   Presenter, broadcasting and rec: collects coins   PLOF: Independent with basic ADLs  PATIENT GOALS:   No more falls   NEXT MD VISIT: Nothing scheduled  OBJECTIVE:   Vitals: HR 98  B/P  121/84  DIAGNOSTIC FINDINGS:  Following fall x-ray of right knee:IMPRESSION: 1. No acute findings. 2. Severe lateral compartment osteoarthritis. PATIENT SURVEYS:    COGNITION: Overall cognitive status: Within functional limits for tasks assessed     SENSATION: WFL  EDEMA:  Per visual inspection bilateral edema  POSTURE:    PALPATION: Mild tenderness to palpation in right lateral knee  LOWER EXTREMITY ROM:  Active ROM Right eval Left eval  Hip flexion    Hip extension    Hip abduction    Hip adduction    Hip internal rotation    Hip external rotation    Knee flexion Mild pain with right knee flexion   Knee extension    Ankle dorsiflexion    Ankle plantarflexion    Ankle inversion    Ankle eversion     (Blank rows = not tested)  LOWER EXTREMITY MMT:  MMT Right eval Left eval Right  Left Right  6/6 Left 6/6   Hip flexion 28.7 32.2 21.7 18.5 22.5 20.2  Hip extension        Hip abduction 26.0 22.0 31.2 28.4 22.7 32.4  Hip adduction        Hip internal rotation        Hip external rotation        Knee flexion        Knee extension 36.9 33.6 45 48 25.4 26.0  Ankle dorsiflexion         Ankle plantarflexion        Ankle inversion        Ankle eversion         (Blank rows = not tested)   FUNCTIONAL TESTS:  5x sit to stand 22 seconds   BERG Balance Test          Date:   Sit to Stand 3 3 03/08/2023  Standing unsupported 3 3  Sitting with back unsupported but feet supported 4 4  Stand to sit  3 4  Transfers  2 3  Standing unsupported with eyes closed 3 3  Standing unsupported feet together  3 3  From standing position, reach forward with outstretched arm 2 1  From standing position, pick up object from floor 1 1  From standing position, turn and look behind over each shoulder 2 1  Turn 360 2 1  Standing unsupported, alternately place foot on step 1 1  Standing unsupported, one foot in front 0 0  Standing on one leg 0 0  Total:  29 28     GAIT: Flexed posture on the walker; decreased hip flexion    TODAY'S TREATMENT:                                                                                                                              DATE:  6/6 Reviewed strength and balance numbers.   Berg balance testing   Seated:  March x20 green  Hip abduction x20 green  LAQ x20 green  Bilateral ER 2x10  Horizontal abduction 2x10  Bilateral shoulder flexion      5/31 Gait with rollator 379ft.  Nu step level 3 x58min LAQ 1.5 # 3x10ea  Hip abduction 2x30 green  Seated March 1.5# 2x15 Sit to stands 2x5 Standing heel raise 3x10 Standing march 2x10 Romberg with head turns/nods 20sec x2ea Modified tandem stance 30sec x2ea   5/24 Gait with rollator 326ft.  Nu step level 3 x19min LAQ 1# 3x15 ea Hip abduction 2x15 green  Seated March 1# 2x15 Sit to stands 2x5 Standing heel raise 3x10 Standing march 2x10 Romberg with head turns/nods 20sec x2ea Modified tandem stance 30sec x2ea  5/8 Bilateral er green 2x10  Bilateral Horizontal abduction 2x10 green Hip abduction 2x15 green  Seated March 2x15 reported some fatigue   LAQ 2x15  bilateral    5/1  SAQ 2x15  Hip abduction 2x15 green  Seated March 2x15 reported some fatigue   LAQ 2x15 bilateral  Hamstring curl green 2x10    Biceps curls 3x10 2lbs  Shoulder press 3x10 2lbs   Narrow base eyes closed 2x30 sec hold   Tandem stance eyes open and closed 2x30 sec each   Foam pad Narrow BOS x20, 3x15sec eyes closed Standing heel raise 3x10    PATIENT EDUCATION:  Education details: reviewed HEP, symptom management,  Person educated: Patient Education method: Explanation Education comprehension: verbalized understanding, returned demonstration, verbal cues required, tactile cues required, and needs further education  HOME EXERCISE PROGRAM: Has prior HEP  ASSESSMENT:  CLINICAL IMPRESSION: The patients objective measures are about the same. His BERG improved 1 [name] and his strength measures are similar. His quads are a little weaker.  He reports he has been doing his exercises at home. He also reports he has been walking at home. We reviewed his home exercises with him. At this time he has plateaud in progress. See below for goal specific progress. D/C to HEP.     OBJECTIVE IMPAIRMENTS: Abnormal gait, decreased activity tolerance, decreased balance, decreased mobility, difficulty walking, decreased ROM, decreased strength, and pain.  ACTIVITY LIMITATIONS: carrying, lifting, bending, sitting, standing, squatting, stairs, transfers, and locomotion level  PARTICIPATION LIMITATIONS: meal prep, cleaning, laundry, driving, shopping, community activity, and yard work  PERSONAL FACTORS: Age and 1-2 comorbidities: right knee OA  are also affecting patient's functional outcome.   REHAB POTENTIAL: Good  CLINICAL DECISION MAKING: Evolving/moderate complexity  EVALUATION COMPLEXITY: Moderate   GOALS: Goals reviewed with patient? Yes  SHORT TERM GOALS: Target date: 01/02/2023   Patient will increase Berg scale by 7 [name] Baseline: Goal status: 1 [name] not  achieved 6/6  2.  Patient will decrease 5 times sit to stand time by 5 seconds Baseline:  Goal status: not achieved 6/6  3.  Patient will increase gross bilateral lower extremity strength by 5 pounds Baseline:  Goal status:  no significant improvement. Not met   4.  Patient will be independent with baseline HEP Baseline:  Goal status: Met 4/24    LONG TERM GOALS: Target date: 01/23/2023    Patient will report no falls over 6-week stretch Baseline:  Goal status: Achieved 6/6  2.  Patient will self report improved balance when performing transfers Baseline:  Goal status: he reports it feels fine  6/6 achieved   3.  Patient will ambulate 3000 feet without increased fatigue and with good balance with least restrictive assistive device Baseline:  Goal status: work in progress  achieved   PLAN:  PT FREQUENCY: 2x/week  PT DURATION: 6 weeks  PLANNED INTERVENTIONS: Therapeutic exercises, Therapeutic activity, Neuromuscular re-education, Balance training, Gait training, Patient/Family education, Self Care, Joint mobilization, Stair training, DME instructions, Aquatic Therapy, Cryotherapy, Moist heat, Ultrasound, and Manual therapy  PLAN FOR NEXT SESSION:  Patient is performing home exercises at home.  Main focus will be on improving balance. Consider hurdles or step ups. Consider progression into gym exercises if tolerated.      Lorayne Bender PT DPT   6/6/20243

## 2023-05-28 ENCOUNTER — Encounter (HOSPITAL_BASED_OUTPATIENT_CLINIC_OR_DEPARTMENT_OTHER): Payer: Self-pay | Admitting: Cardiology

## 2023-05-28 ENCOUNTER — Ambulatory Visit (HOSPITAL_BASED_OUTPATIENT_CLINIC_OR_DEPARTMENT_OTHER): Payer: Medicare Other | Admitting: Cardiology

## 2023-05-28 VITALS — BP 122/75 | HR 89 | Ht 71.0 in | Wt 217.4 lb

## 2023-05-28 DIAGNOSIS — D6869 Other thrombophilia: Secondary | ICD-10-CM

## 2023-05-28 DIAGNOSIS — I4821 Permanent atrial fibrillation: Secondary | ICD-10-CM

## 2023-05-28 DIAGNOSIS — I10 Essential (primary) hypertension: Secondary | ICD-10-CM

## 2023-05-28 DIAGNOSIS — I872 Venous insufficiency (chronic) (peripheral): Secondary | ICD-10-CM | POA: Diagnosis not present

## 2023-05-28 DIAGNOSIS — Z8719 Personal history of other diseases of the digestive system: Secondary | ICD-10-CM

## 2023-05-28 NOTE — Patient Instructions (Signed)

## 2023-05-28 NOTE — Progress Notes (Signed)
Cardiology Office Note:  .    Date:  05/28/2023  ID:  Glen [name], DOB [birth year], MRN [id number] PCP: Sharon Seller, NP  [facility] Providers Cardiologist:  Jodelle Red, MD     History of Present Illness: .    Glen [name] is a 89 y.o. male with a hx of hypertension, hyperlidemia and permanent AF, previously on coumadin but now on no anticoagulation who is seen for follow up.    Pertinent history: 07/2020 hospitalization for GI bleed. ECG 07/14/20 afib RVR with PVC. Only cardiac medication at that visit was metoprolol, and heart rate was 95 bpm. I discussed changing to extended release metoprolol but he declined and would prefer to keep tartrate, as he said this was what Dr. Tresa Endo had recommended to him. He was difficult to keep on task during the interview. He had chronic LE edema, which he endorsed is from the metoprolol. We discussed chronic venous insufficiency in depth. Could not feel his afib.   He had been admitted to the hospital 08/09/2020 with nausea and black tarry stools. He was discharged on omeprazole 40 mg twice daily. He was last seen in cardiology by Dr. Tresa Endo 08/08/2021 where he reported  a bleeding ulcer, and a typical heart rate in the 90s. We have discussed anticoagulation at length. I recommended apixaban over coumadin, but he was concerned about potential for recurrent GI bleed. We discussed risk of stroke vs. Bleed at length. He understood the risk and elected not to restart anticoagulation.   He presented to the ED 10/21/2022. He had fallen onto his right knee after standing up and becoming lightheaded/dizzy. Previous fall was noted to be in the setting of UTI; urinalysis was negative for infection. No acute findings in X-ray of right knee.    He followed up with Edd Fabian, NP  10/24/2022. He complained of upper and lower extremity generalized weakness. He was referred to physical therapy for evaluation and treatment.   At his visit  11/2022, he reported stable blood pressures and heart rates at home. He did have a fall one month prior onto the floor and bruised his right knee. EMS was called. Sometimes felt lightheaded after getting up and walking; used a rollator for support. Had an ear infection with impacted cerumen treated with antibiotics and drops.   Today, he is accompanied by a family member. He states that he generally has more good days than bad. On some days he feels terrible with generalized body aches and feeling unwell, does improve with tylenol as needed.  He continues to complain of LE edema to the top of his feet, ever since his prior ear infection as he discussed with me at our last visit. Lately he is taking his Lasix BID. He states that every once in a while he will take two tablets in the morning. No significant fluid swelling today.   Typically he is asymptomatic regarding his atrial fibrillation. Occasionally he may notice some pressure in his chest/abdomen due to indigestion. He is taking walks outside for exercise with no anginal symptoms.  He denies any palpitations, shortness of breath, lightheadedness, headaches, syncope, orthopnea, or PND. No hematuria, hematochezia, or melena.  ROS:  Please see the history of present illness. ROS otherwise negative except as noted.  (+) Generalized myalgias/aching pains (+) Intermittent LE edema  Studies Reviewed: Marland Kitchen        Physical Exam:    VS:  BP 122/75 (BP Location: Right Arm, Patient Position: Sitting, Cuff  Size: Large)   Pulse 89   Ht 5\' 11"  (1.803 m)   Wt 217 lb 6.4 oz (98.6 kg)   SpO2 93%   BMI 30.32 kg/m    Wt Readings from Last 3 Encounters:  05/28/23 217 lb 6.4 oz (98.6 kg)  02/19/23 202 lb (91.6 kg)  01/16/23 209 lb 9.6 oz (95.1 kg)    GEN: Well nourished, well developed in no acute distress HEENT: Normal, moist mucous membranes NECK: No JVD CARDIAC: Irregularly irregular rhythm, normal S1 and S2, no rubs or gallops. No murmur. VASCULAR:  Radial and DP pulses 2+ bilaterally. No carotid bruits RESPIRATORY:  Clear to auscultation without rales, wheezing or rhonchi  ABDOMEN: Soft, non-tender, non-distended MUSCULOSKELETAL:  Ambulates independently SKIN: Warm and dry, no edema NEUROLOGIC:  Alert and oriented x 3. No focal neuro deficits noted. PSYCHIATRIC:  Normal affect   ASSESSMENT AND PLAN: .    LE edema: -no significant edema today. Most suggestive of chronic venous insufficiency, though may also have contribution from permanent afib -continue BID lasix -recommend compression stockings, elevation   History of food impaction/dysphagia, s/p esophageal dilation History of GI bleeding -see prior notes. He denies bleeding today, but declines anticoagulation, see below   Atrial fibrillation, permanent -H/H has been stable, no recent bleeding This patients CHA2DS2-VASc Score and unadjusted Ischemic Stroke Rate (% per year) is equal to 4.8 % stroke rate/year from a score of 4 -rate controlled with metoprolol tartrate 100 mg BID -we have discussed anticoagulation at length. He has spent time doing research and does not want anticoagulation at this time. I recommended apixaban over coumadin, but he is concerned about potential for recurrent GI bleed. We discussed risk of stroke vs. Bleed at length. He understands risk and elects not to restart anticoagulation   Hypertension: -well controlled on metoprolol and lasix today  Dispo: Follow-up in 6 months, or sooner as needed.   I,Mathew Stumpf,acting as a Neurosurgeon for [employer], MD.,have documented all relevant documentation on the behalf of Jodelle Red, MD,as directed by  Jodelle Red, MD while in the presence of Jodelle Red, MD.  I, Jodelle Red, MD, have reviewed all documentation for this visit. The documentation on 05/28/23 for the exam, diagnosis, procedures, and orders are all accurate and complete.   Signed, Jodelle Red, MD

## 2023-07-20 ENCOUNTER — Other Ambulatory Visit (HOSPITAL_BASED_OUTPATIENT_CLINIC_OR_DEPARTMENT_OTHER): Payer: Self-pay

## 2023-07-20 DIAGNOSIS — Z23 Encounter for immunization: Secondary | ICD-10-CM | POA: Diagnosis not present

## 2023-07-20 MED ORDER — INFLUENZA VAC A&B SURF ANT ADJ 0.5 ML IM SUSY
0.5000 mL | PREFILLED_SYRINGE | Freq: Once | INTRAMUSCULAR | 0 refills | Status: AC
  Filled 2023-07-20: qty 0.5, 1d supply, fill #0

## 2023-07-20 MED ORDER — COVID-19 MRNA VAC-TRIS(PFIZER) 30 MCG/0.3ML IM SUSY
0.3000 mL | PREFILLED_SYRINGE | Freq: Once | INTRAMUSCULAR | 0 refills | Status: AC
  Filled 2023-07-20: qty 0.3, 1d supply, fill #0

## 2023-08-24 ENCOUNTER — Ambulatory Visit: Payer: Medicare Other | Admitting: Nurse Practitioner

## 2023-09-03 ENCOUNTER — Ambulatory Visit (INDEPENDENT_AMBULATORY_CARE_PROVIDER_SITE_OTHER): Payer: Medicare Other | Admitting: Nurse Practitioner

## 2023-09-03 ENCOUNTER — Encounter: Payer: Self-pay | Admitting: Nurse Practitioner

## 2023-09-03 VITALS — BP 126/70 | HR 108 | Temp 97.3°F | Ht 71.0 in | Wt 221.0 lb

## 2023-09-03 DIAGNOSIS — G319 Degenerative disease of nervous system, unspecified: Secondary | ICD-10-CM | POA: Diagnosis not present

## 2023-09-03 DIAGNOSIS — M159 Polyosteoarthritis, unspecified: Secondary | ICD-10-CM | POA: Diagnosis not present

## 2023-09-03 DIAGNOSIS — M545 Low back pain, unspecified: Secondary | ICD-10-CM | POA: Diagnosis not present

## 2023-09-03 DIAGNOSIS — R600 Localized edema: Secondary | ICD-10-CM | POA: Diagnosis not present

## 2023-09-03 DIAGNOSIS — K219 Gastro-esophageal reflux disease without esophagitis: Secondary | ICD-10-CM | POA: Diagnosis not present

## 2023-09-03 DIAGNOSIS — I4821 Permanent atrial fibrillation: Secondary | ICD-10-CM

## 2023-09-03 DIAGNOSIS — G8929 Other chronic pain: Secondary | ICD-10-CM

## 2023-09-03 LAB — COMPLETE METABOLIC PANEL WITH GFR
AG Ratio: 1.6 (calc) (ref 1.0–2.5)
ALT: 23 U/L (ref 9–46)
AST: 23 U/L (ref 10–35)
Albumin: 4.1 g/dL (ref 3.6–5.1)
Alkaline phosphatase (APISO): 83 U/L (ref 35–144)
BUN: 25 mg/dL (ref 7–25)
CO2: 26 mmol/L (ref 20–32)
Calcium: 9.1 mg/dL (ref 8.6–10.3)
Chloride: 106 mmol/L (ref 98–110)
Creat: 0.93 mg/dL (ref 0.70–1.22)
Globulin: 2.5 g/dL (ref 1.9–3.7)
Glucose, Bld: 113 mg/dL — ABNORMAL HIGH (ref 65–99)
Potassium: 4.2 mmol/L (ref 3.5–5.3)
Sodium: 140 mmol/L (ref 135–146)
Total Bilirubin: 0.7 mg/dL (ref 0.2–1.2)
Total Protein: 6.6 g/dL (ref 6.1–8.1)
eGFR: 77 mL/min/{1.73_m2} (ref >=60)

## 2023-09-03 LAB — CBC WITH DIFFERENTIAL/PLATELET
Absolute Lymphocytes: 2036 {cells}/uL (ref 850–3900)
Absolute Monocytes: 616 {cells}/uL (ref 200–950)
Basophils Absolute: 62 {cells}/uL (ref 0–200)
Basophils Relative: 0.8 %
Eosinophils Absolute: 211 {cells}/uL (ref 15–500)
Eosinophils Relative: 2.7 %
HCT: 47.9 % (ref 38.5–50.0)
Hemoglobin: 16 g/dL (ref 13.2–17.1)
MCH: 31.9 pg (ref 27.0–33.0)
MCHC: 33.4 g/dL (ref 32.0–36.0)
MCV: 95.6 fL (ref 80.0–100.0)
MPV: 10.6 fL (ref 7.5–12.5)
Monocytes Relative: 7.9 %
Neutro Abs: 4875 {cells}/uL (ref 1500–7800)
Neutrophils Relative %: 62.5 %
Platelets: 202 10*3/uL (ref 140–400)
RBC: 5.01 10*6/uL (ref 4.20–5.80)
RDW: 12.2 % (ref 11.0–15.0)
Total Lymphocyte: 26.1 %
WBC: 7.8 10*3/uL (ref 3.8–10.8)

## 2023-09-03 NOTE — Progress Notes (Signed)
Careteam: Patient Care Team: Sharon Seller, NP as PCP - General (Geriatric Medicine) Jodelle Red, MD as PCP - Cardiology (Cardiology) Anette Riedel, MD as Rounding Team (Internal Medicine)  PLACE OF SERVICE:  [facility]  Advanced Directive information Does Patient Have a Medical Advance Directive?: Yes, Type of Advance Directive: Healthcare Power of [place];Living will, Does patient want to make changes to medical advance directive?: No - Patient declined  Allergies  Allergen Reactions   Protonix [Pantoprazole] Other (See Comments)    Hallucinations   Codeine Other (See Comments)    constpation   Ferrous Sulfate Itching and Swelling    Chief Complaint  Patient presents with   Medical Management of Chronic Issues    6 month follow-up.      HPI: Patient is a 89 y.o. male for routine follow up  Reports his back has been hurting terribly. Takes tylenol which slows down the pain.  Takes 2 in the morning of the 500 mg tablets. Son tries to get him to take it in the evening but he said it only helps for about an hour.  Son would like him to do physical therapy.  Very sedentary at home.  [facility] in [facility] past.  Also has aches in legs and feet.   LE edema well controlled on lasix.   GERD- controlled on pepcid  Review of Systems:  Review of Systems  Constitutional:  Negative for chills, fever and weight loss.  HENT:  Negative for tinnitus.   Respiratory:  Negative for cough, sputum production and shortness of breath.   Cardiovascular:  Positive for palpitations (occasionally with activity). Negative for chest pain and leg swelling.  Gastrointestinal:  Negative for abdominal pain, constipation, diarrhea and heartburn.  Genitourinary:  Negative for dysuria, frequency and urgency.  Musculoskeletal:  Positive for back pain, joint pain and myalgias. Negative for falls.  Skin: Negative.   Neurological:  Negative for dizziness and headaches.   Psychiatric/Behavioral:  Negative for depression and memory loss. The patient does not have insomnia.     Past Medical History:  Diagnosis Date   Atrial fibrillation [facility])    Atrial fibrillation, chronic (HCC) 03/13/2008   Qualifier: Diagnosis of  By: Amador Cunas  MD, Janett Labella    Diverticulitis    Esophageal dysmotility    Esophageal dysphagia 07/29/2018   Family history of diabetes insipidus    FHx: colon cancer    H. pylori infection    Hiatal hernia    History of colonic polyps    Hyperlipidemia    Hypertension    IDA (iron deficiency anemia)    Obesity    Palpitation    Peptic ulcer disease    Presbyesophagus    Schatzki's ring    Past Surgical History:  Procedure Laterality Date   2-D Echocardiogram  November 2009, 2011   Anal Fistula Repair     BALLOON DILATION N/A 01/17/2022   Procedure: [place];  Surgeon: Lynann Bologna, MD;  Location: [facility];  Service: Gastroenterology;  Laterality: N/A;   BIOPSY  07/14/2020   Procedure: BIOPSY;  Surgeon: Beverley Fiedler, MD;  Location: [facility] ENDOSCOPY;  Service: Gastroenterology;;   BIOPSY  01/17/2022   Procedure: BIOPSY;  Surgeon: Lynann Bologna, MD;  Location: [facility] ENDOSCOPY;  Service: Gastroenterology;;   CARDIAC VALVE SURGERY  1996   status post balloon valvul[facility] at [facility]   CATARACT EXTRACTION  2004   COLONOSCOPY  December 2005   ESOPHAGOGASTRODUODENOSCOPY  07/14/2020  ESOPHAGOGASTRODUODENOSCOPY N/A 01/17/2022   Procedure: ESOPHAGOGASTRODUODENOSCOPY (EGD);  Surgeon: Lynann Bologna, MD;  Location: [facility];  Service: Gastroenterology;  Laterality: N/A;   ESOPHAGOGASTRODUODENOSCOPY (EGD) WITH PROPOFOL N/A 07/14/2020   Procedure: ESOPHAGOGASTRODUODENOSCOPY (EGD) WITH PROPOFOL;  Surgeon: Beverley Fiedler, MD;  Location: [facility] ENDOSCOPY;  Service: Gastroenterology;  Laterality: N/A;   ETT  1991   Negative   HEMOSTASIS CLIP PLACEMENT  07/14/2020   HEMOSTASIS CLIP PLACEMENT   HEMOSTASIS CLIP PLACEMENT  07/14/2020    Procedure: HEMOSTASIS CLIP PLACEMENT;  Surgeon: Beverley Fiedler, MD;  Location: [facility] ENDOSCOPY;  Service: Gastroenterology;;   KNEE ARTHROSCOPY     Right   TONSILLECTOMY     Social History:   reports that he has never smoked. He has been exposed to tobacco smoke. He has never used smokeless tobacco. He reports that he does not drink alcohol and does not use drugs.  Family History  Problem Relation Age of Onset   Cancer Mother    Heart Problems Father    Stroke Father    Heart attack Father    Breast cancer Sister    Stroke Brother    Heart Problems Brother    Esophageal cancer Neg Hx     Medications: Patient's Medications  New Prescriptions   No medications on file  Previous Medications   ACETAMINOPHEN (TYLENOL) 500 MG TABLET    Take 1,500 mg by mouth at bedtime. And 2 in the morning   DIPHENHYDRAMINE HCL (ALLERGY MED PO)    Take 1 tablet by mouth daily.   FAMOTIDINE (ACID REDUCER PO)    Take 1 tablet by mouth daily.   FUROSEMIDE (LASIX) 20 MG TABLET    Take 1 tablet (20 mg total) by mouth 2 (two) times daily.   METAMUCIL FIBER PO    Take 3 tablets by mouth in the morning and at bedtime.   METOPROLOL TARTRATE (LOPRESSOR) 100 MG TABLET    Take 1 tablet by mouth twice daily.   MULTIPLE VITAMIN (MULTIVITAMIN) TABLET    Take 1 tablet by mouth daily.  Modified Medications   No medications on file  Discontinued Medications   No medications on file    Physical Exam:  Vitals:   09/03/23 1457  BP: 126/70  Pulse: (!) 108  Temp: (!) 97.3 F (36.3 C)  TempSrc: Temporal  SpO2: 97%  Weight: 221 lb (100.2 kg)  Height: 5\' 11"  (1.803 m)   Body mass index is 30.82 kg/m. Wt Readings from Last 3 Encounters:  09/03/23 221 lb (100.2 kg)  05/28/23 217 lb 6.4 oz (98.6 kg)  02/19/23 202 lb (91.6 kg)    Physical Exam Constitutional:      General: He is not in acute distress.    Appearance: He is well-developed. He is not diaphoretic.  HENT:     Head: Normocephalic and atraumatic.      Right Ear: External ear normal.     Left Ear: External ear normal.     Mouth/Throat:     Pharynx: No oropharyngeal exudate.  Eyes:     Conjunctiva/sclera: Conjunctivae normal.     Pupils: Pupils are equal, round, and reactive to light.  Cardiovascular:     Rate and Rhythm: Normal rate and regular rhythm.     Heart sounds: Normal heart sounds.  Pulmonary:     Effort: Pulmonary effort is normal.     Breath sounds: Normal breath sounds.  Abdominal:     General: Bowel sounds are normal.     Palpations:  Abdomen is soft.  Musculoskeletal:        General: No tenderness.     Cervical back: Normal range of motion and neck supple.     Right lower leg: No edema.     Left lower leg: No edema.  Skin:    General: Skin is warm and dry.  Neurological:     Mental Status: He is alert and oriented to person, place, and time.     Labs reviewed: Basic Metabolic Panel: Recent Labs    10/04/22 0944 10/21/22 0945 02/19/23 1547  NA 143 140 141  K 4.0 4.0 5.0  CL 105 105 105  CO2 23 29 27   GLUCOSE 102* 96 117*  BUN 23 24* 27*  CREATININE 0.91 0.86 0.97  CALCIUM 9.0 8.7* 9.5   Liver Function Tests: Recent Labs    02/19/23 1547  AST 23  ALT 18  BILITOT 0.5  PROT 6.7   No results for input(s): "LIPASE", "AMYLASE" in the last 8760 hours. No results for input(s): "AMMONIA" in the last 8760 hours. CBC: Recent Labs    10/04/22 0944 10/21/22 0945  WBC 9.9 9.5  HGB 16.3 16.2  HCT 47.3 48.0  MCV 96 97.6  PLT 209 181   Lipid Panel: No results for input(s): "CHOL", "HDL", "LDLCALC", "TRIG", "CHOLHDL", "LDLDIRECT" in the last 8760 hours. TSH: No results for input(s): "TSH" in the last 8760 hours. A1C: Lab Results  Component Value Date   HGBA1C 5.6 07/11/2022     Assessment/Plan 1. Permanent atrial fibrillation (HCC) Rate controlled, continues on metoprolol 100 mg by mouth twice daily - CBC with Differential/Platelet - Complete Metabolic Panel with eGFR  2. Osteoarthritis  of multiple joints, unspecified osteoarthritis type -ongoing continues on tylenol, will get PT to help with back pain and OA. Encouraged to keep moving to help strength which will help with pain.   3. Cerebral atrophy (HCC) -Stable, no acute changes in cognitive or functional status, continue supportive care.   4. Bilateral leg edema -stable, continues on lasix BID  5. Gastroesophageal reflux disease without esophagitis Controlled on pepcid  6. Chronic bilateral low back pain, unspecified whether sciatica present Continue to tylenol, can take 500 mg tablet- 2 tablets every 8 hours as needed pain.  - Ambulatory referral to Physical Therapy for strength training to help with mobility and pain   Return in about 6 months (around 03/03/2024) for routine follow up.:   [name] [phone]

## 2023-09-06 ENCOUNTER — Emergency Department (HOSPITAL_COMMUNITY): Payer: Medicare Other

## 2023-09-06 ENCOUNTER — Emergency Department (HOSPITAL_COMMUNITY)
Admission: EM | Admit: 2023-09-06 | Discharge: 2023-09-06 | Disposition: A | Discharge status: Home / Self Care | Payer: Medicare Other | Attending: Student | Admitting: Student

## 2023-09-06 ENCOUNTER — Encounter (HOSPITAL_COMMUNITY): Payer: Self-pay

## 2023-09-06 ENCOUNTER — Other Ambulatory Visit: Payer: Self-pay

## 2023-09-06 DIAGNOSIS — I959 Hypotension, unspecified: Secondary | ICD-10-CM | POA: Diagnosis not present

## 2023-09-06 DIAGNOSIS — Z79899 Other long term (current) drug therapy: Secondary | ICD-10-CM | POA: Insufficient documentation

## 2023-09-06 DIAGNOSIS — R000 Tachycardia, unspecified: Secondary | ICD-10-CM | POA: Insufficient documentation

## 2023-09-06 DIAGNOSIS — S0990XA Unspecified injury of head, initial encounter: Secondary | ICD-10-CM | POA: Insufficient documentation

## 2023-09-06 DIAGNOSIS — S3992XA Unspecified injury of lower back, initial encounter: Secondary | ICD-10-CM | POA: Diagnosis not present

## 2023-09-06 DIAGNOSIS — Y92000 Kitchen of unspecified non-institutional (private) residence as  the place of occurrence of the external cause: Secondary | ICD-10-CM | POA: Insufficient documentation

## 2023-09-06 DIAGNOSIS — I10 Essential (primary) hypertension: Secondary | ICD-10-CM | POA: Insufficient documentation

## 2023-09-06 DIAGNOSIS — I771 Stricture of artery: Secondary | ICD-10-CM | POA: Diagnosis not present

## 2023-09-06 DIAGNOSIS — R0902 Hypoxemia: Secondary | ICD-10-CM | POA: Diagnosis not present

## 2023-09-06 DIAGNOSIS — Z7722 Contact with and (suspected) exposure to environmental tobacco smoke (acute) (chronic): Secondary | ICD-10-CM | POA: Insufficient documentation

## 2023-09-06 DIAGNOSIS — R42 Dizziness and giddiness: Secondary | ICD-10-CM | POA: Diagnosis not present

## 2023-09-06 DIAGNOSIS — R918 Other nonspecific abnormal finding of lung field: Secondary | ICD-10-CM | POA: Diagnosis not present

## 2023-09-06 DIAGNOSIS — R531 Weakness: Secondary | ICD-10-CM | POA: Diagnosis not present

## 2023-09-06 DIAGNOSIS — I700 Atherosclerosis of aorta: Secondary | ICD-10-CM | POA: Diagnosis not present

## 2023-09-06 DIAGNOSIS — W19XXXA Unspecified fall, initial encounter: Secondary | ICD-10-CM | POA: Insufficient documentation

## 2023-09-06 DIAGNOSIS — M545 Low back pain, unspecified: Secondary | ICD-10-CM | POA: Diagnosis not present

## 2023-09-06 DIAGNOSIS — M4316 Spondylolisthesis, lumbar region: Secondary | ICD-10-CM | POA: Diagnosis not present

## 2023-09-06 DIAGNOSIS — G4489 Other headache syndrome: Secondary | ICD-10-CM | POA: Diagnosis not present

## 2023-09-06 LAB — CBC WITH DIFFERENTIAL/PLATELET
Abs Immature Granulocytes: 0.03 10*3/uL (ref 0.00–0.07)
Basophils Absolute: 0.1 10*3/uL (ref 0.0–0.1)
Basophils Relative: 1 %
Eosinophils Absolute: 0.1 10*3/uL (ref 0.0–0.5)
Eosinophils Relative: 1 %
HCT: 43.7 % (ref 39.0–52.0)
Hemoglobin: 15.1 g/dL (ref 13.0–17.0)
Immature Granulocytes: 0 %
Lymphocytes Relative: 17 %
Lymphs Abs: 1.4 10*3/uL (ref 0.7–4.0)
MCH: 33.1 pg (ref 26.0–34.0)
MCHC: 34.6 g/dL (ref 30.0–36.0)
MCV: 95.8 fL (ref 80.0–100.0)
Monocytes Absolute: 0.6 10*3/uL (ref 0.1–1.0)
Monocytes Relative: 7 %
Neutro Abs: 5.8 10*3/uL (ref 1.7–7.7)
Neutrophils Relative %: 74 %
Platelets: 183 10*3/uL (ref 150–400)
RBC: 4.56 MIL/uL (ref 4.22–5.81)
RDW: 12.8 % (ref 11.5–15.5)
WBC: 8 10*3/uL (ref 4.0–10.5)
nRBC: 0 % (ref 0.0–0.2)

## 2023-09-06 LAB — COMPREHENSIVE METABOLIC PANEL
ALT: 22 U/L (ref 0–44)
AST: 27 U/L (ref 15–41)
Albumin: 3.6 g/dL (ref 3.5–5.0)
Alkaline Phosphatase: 69 U/L (ref 38–126)
Anion gap: 8 (ref 5–15)
BUN: 27 mg/dL — ABNORMAL HIGH (ref 8–23)
CO2: 23 mmol/L (ref 22–32)
Calcium: 8.6 mg/dL — ABNORMAL LOW (ref 8.9–10.3)
Chloride: 110 mmol/L (ref 98–111)
Creatinine, Ser: 0.92 mg/dL (ref 0.61–1.24)
GFR, Estimated: >60 mL/min (ref >60)
Glucose, Bld: 101 mg/dL — ABNORMAL HIGH (ref 70–99)
Potassium: 3.7 mmol/L (ref 3.5–5.1)
Sodium: 141 mmol/L (ref 135–145)
Total Bilirubin: 1.2 mg/dL — ABNORMAL HIGH (ref <1.2)
Total Protein: 6.2 g/dL — ABNORMAL LOW (ref 6.5–8.1)

## 2023-09-06 LAB — URINALYSIS, W/ REFLEX TO CULTURE (INFECTION SUSPECTED)
Bacteria, UA: NONE SEEN
Bilirubin Urine: NEGATIVE
Glucose, UA: NEGATIVE mg/dL
Hgb urine dipstick: NEGATIVE
Ketones, ur: NEGATIVE mg/dL
Leukocytes,Ua: NEGATIVE
Nitrite: NEGATIVE
Protein, ur: NEGATIVE mg/dL
Specific Gravity, Urine: 1.018 (ref 1.005–1.030)
pH: 5 (ref 5.0–8.0)

## 2023-09-06 MED ORDER — FUROSEMIDE 40 MG PO TABS
20.0000 mg | ORAL_TABLET | Freq: Two times a day (BID) | ORAL | Status: DC
  Administered 2023-09-06: 20 mg via ORAL
  Filled 2023-09-06: qty 1

## 2023-09-06 MED ORDER — METOPROLOL TARTRATE 25 MG PO TABS
100.0000 mg | ORAL_TABLET | Freq: Two times a day (BID) | ORAL | Status: DC
  Administered 2023-09-06: 100 mg via ORAL
  Filled 2023-09-06: qty 4

## 2023-09-06 NOTE — ED Triage Notes (Signed)
[facility] brought him from home. Pt is hard of hearing. Pt fell when getting up this morning and was not using his walker. Was able to get up off ground and onto couch. Increased work of breathing. Was able to stand and walk after. Said he was feeling dizzy. Orthostatic 90s lowest. HR 130 and irregular. Pt says he feels "fuzzy". Not on a blood thinner. O2 95 with Oxygen. Not normally on O2 at home. CBG 119  More irritable lately and family said he has been sleep walking which isn't normal for him.

## 2023-09-06 NOTE — ED Provider Notes (Signed)
[place] EMERGENCY DEPARTMENT AT [facility] Provider Note  CSN: [id number] Arrival date & time: 09/06/23 1914  Chief Complaint(s) Fall and Dizziness  HPI Glen [name] is a 89 y.o. male with PMH permanent A-fib not on anticoagulation, anemia, peptic ulcer disease who presents emergency room for evaluation of a fall.  Patient states that he was in the kitchen bending down to open the fridge when he fell backwards landing on his tailbone.  States that this has happened before.  He was able to scoot to the couch and stand after the fall.  Wife then called EMS.  Additional history obtained from patient's family states that he had an episode of transient alteration of awareness at night 48 hours ago but has returned to his normal mental status baseline.  Denies chest pain, shortness of breath, abdominal pain, nausea, vomiting or other systemic symptoms.   Past Medical History Past Medical History:  Diagnosis Date   Atrial fibrillation [facility])    Atrial fibrillation, chronic (HCC) 03/13/2008   Qualifier: Diagnosis of  By: Amador Cunas  MD, Janett Labella    Diverticulitis    Esophageal dysmotility    Esophageal dysphagia 07/29/2018   Family history of diabetes insipidus    FHx: colon cancer    H. pylori infection    Hiatal hernia    History of colonic polyps    Hyperlipidemia    Hypertension    IDA (iron deficiency anemia)    Obesity    Palpitation    Peptic ulcer disease    Presbyesophagus    Schatzki's ring    Patient Active Problem List   Diagnosis Date Noted   History of GI bleed 10/04/2022   Permanent atrial fibrillation (HCC) 10/04/2022   Physical deconditioning 01/16/2022   Dehydration 01/16/2022   Peptic ulcer disease    Anemia, unspecified 07/16/2020   Fall 07/13/2020   Osteoarthritis 06/25/2019   Onychomycosis 07/29/2018   Peripheral edema 11/18/2015   Impaired glucose tolerance 04/19/2015   Chronic atrial fibrillation (HCC) 11/07/2010   Essential  hypertension 01/20/2009   Dyslipidemia 07/31/2007   History of colonic polyps 07/31/2007   Home Medication(s) Prior to Admission medications   Medication Sig Start Date End Date Taking? Authorizing Provider  acetaminophen (TYLENOL) 500 MG tablet Take 1,500 mg by mouth at bedtime. And 2 in the morning    [provider]  famotidine (PEPCID AC) 10 MG tablet Take 10 mg by mouth daily.    [provider]  furosemide (LASIX) 20 MG tablet Take 1 tablet (20 mg total) by mouth 2 (two) times daily. 11/27/22   Jodelle Red, MD  loratadine (CLARITIN) 10 MG tablet Take 10 mg by mouth daily.    [provider]  METAMUCIL FIBER PO Take 3 tablets by mouth in the morning and at bedtime.    [provider]  metoprolol tartrate (LOPRESSOR) 100 MG tablet Take 1 tablet by mouth twice daily. 11/27/22   Jodelle Red, MD  Multiple Vitamin (MULTIVITAMIN) tablet Take 1 tablet by mouth daily.    [provider]  Past Surgical History Past Surgical History:  Procedure Laterality Date   2-D Echocardiogram  November 2009, 2011   Anal Fistula Repair     BALLOON DILATION N/A 01/17/2022   Procedure: Marvis Repress DILATION;  Surgeon: Lynann Bologna, MD;  Location: [facility] ENDOSCOPY;  Service: Gastroenterology;  Laterality: N/A;   BIOPSY  07/14/2020   Procedure: BIOPSY;  Surgeon: Beverley Fiedler, MD;  Location: [facility] ENDOSCOPY;  Service: Gastroenterology;;   BIOPSY  01/17/2022   Procedure: BIOPSY;  Surgeon: Lynann Bologna, MD;  Location: [facility] ENDOSCOPY;  Service: Gastroenterology;;   CARDIAC VALVE SURGERY  1996   status post balloon valvuloplasty at [facility]   CATARACT EXTRACTION  2004   COLONOSCOPY  December 2005   ESOPHAGOGASTRODUODENOSCOPY  07/14/2020   ESOPHAGOGASTRODUODENOSCOPY N/A 01/17/2022   Procedure: ESOPHAGOGASTRODUODENOSCOPY (EGD);  Surgeon:  Lynann Bologna, MD;  Location: [facility];  Service: Gastroenterology;  Laterality: N/A;   ESOPHAGOGASTRODUODENOSCOPY (EGD) WITH PROPOFOL N/A 07/14/2020   Procedure: ESOPHAGOGASTRODUODENOSCOPY (EGD) WITH PROPOFOL;  Surgeon: Beverley Fiedler, MD;  Location: [facility] ENDOSCOPY;  Service: Gastroenterology;  Laterality: N/A;   ETT  1991   Negative   HEMOSTASIS CLIP PLACEMENT  07/14/2020   HEMOSTASIS CLIP PLACEMENT   HEMOSTASIS CLIP PLACEMENT  07/14/2020   Procedure: HEMOSTASIS CLIP PLACEMENT;  Surgeon: Beverley Fiedler, MD;  Location: [facility] ENDOSCOPY;  Service: Gastroenterology;;   KNEE ARTHROSCOPY     Right   TONSILLECTOMY     Family History Family History  Problem Relation Age of Onset   Cancer Mother    Heart Problems Father    Stroke Father    Heart attack Father    Breast cancer Sister    Stroke Brother    Heart Problems Brother    Esophageal cancer Neg Hx     Social History Social History   Tobacco Use   Smoking status: Never    Passive exposure: Past   Smokeless tobacco: Never  Vaping Use   Vaping status: Never Used  Substance Use Topics   Alcohol use: Never   Drug use: Never   Allergies Protonix [pantoprazole], Codeine, and Ferrous sulfate  Review of Systems Review of Systems  Musculoskeletal:  Positive for back pain.    Physical Exam Vital Signs  I have reviewed the triage vital signs BP 117/86   Pulse 83   Temp (!) 97.5 F (36.4 C) (Oral)   Resp 16   Ht 5\' 11"  (1.803 m)   Wt 100.2 kg   SpO2 97%   BMI 30.82 kg/m   Physical Exam Constitutional:      General: He is not in acute distress.    Appearance: Normal appearance.  HENT:     Head: Normocephalic and atraumatic.     Nose: No congestion or rhinorrhea.  Eyes:     General:        Right eye: No discharge.        Left eye: No discharge.     Extraocular Movements: Extraocular movements intact.     Pupils: Pupils are equal, round, and reactive to light.  Cardiovascular:     Rate and Rhythm: Tachycardia  present. Rhythm irregular.     Heart sounds: No murmur heard. Pulmonary:     Effort: No respiratory distress.     Breath sounds: No wheezing or rales.  Abdominal:     General: There is no distension.     Tenderness: There is no abdominal tenderness.  Musculoskeletal:        General: Tenderness present. Normal range of  motion.     Cervical back: Normal range of motion.  Skin:    General: Skin is warm and dry.  Neurological:     General: No focal deficit present.     Mental Status: He is alert.     ED Results and Treatments Labs (all labs ordered are listed, but only abnormal results are displayed) Labs Reviewed  COMPREHENSIVE METABOLIC PANEL - Abnormal; Notable for the following components:      Result Value   Glucose, Bld 101 (*)    BUN 27 (*)    Calcium 8.6 (*)    Total Protein 6.2 (*)    Total Bilirubin 1.2 (*)    All other components within normal limits  CBC WITH DIFFERENTIAL/PLATELET  URINALYSIS, W/ REFLEX TO CULTURE (INFECTION SUSPECTED)                                                                                                                          Radiology CT Lumbar Spine Wo Contrast  Result Date: 09/06/2023 CLINICAL DATA:  Low back pain, trauma EXAM: CT LUMBAR SPINE WITHOUT CONTRAST TECHNIQUE: Multidetector CT imaging of the lumbar spine was performed without intravenous contrast administration. Multiplanar CT image reconstructions were also generated. RADIATION DOSE REDUCTION: This exam was performed according to the departmental dose-optimization program which includes automated exposure control, adjustment of the mA and/or kV according to patient size and/or use of iterative reconstruction technique. COMPARISON:  None Available. FINDINGS: Segmentation: 5 lumbar type vertebrae. Alignment: Grade 1 anterolisthesis of L4 on L5. Vertebrae: No acute fracture or focal pathologic process. Paraspinal and other soft tissues: Aortic atherosclerotic calcification. There  is diverticulosis without evidence of diverticulitis. Small duodenal lipoma in the second portion of the duodenum. Mild prostatomegaly. Disc levels: No evidence of high-grade spinal canal stenosis. IMPRESSION: No acute fracture or traumatic listhesis. . Aortic Atherosclerosis (ICD10-I70.0). Electronically Signed   By: Lorenza Cambridge M.D.   On: 09/06/2023 10:18   CT HEAD WO CONTRAST ( )  Result Date: 09/06/2023 CLINICAL DATA:  Head trauma, minor (Age >= 65y) EXAM: CT HEAD WITHOUT CONTRAST TECHNIQUE: Contiguous axial images were obtained from the base of the skull through the vertex without intravenous contrast. RADIATION DOSE REDUCTION: This exam was performed according to the departmental dose-optimization program which includes automated exposure control, adjustment of the mA and/or kV according to patient size and/or use of iterative reconstruction technique. COMPARISON:  Head CT 07/13/20 FINDINGS: Brain: No hemorrhage. No hydrocephalus. No extra-axial fluid collection. No CT evidence of an acute cortical infarct. No mass effect. No mass lesion. Vascular: No hyperdense vessel or unexpected calcification. Skull: Normal. Negative for fracture or focal lesion. Sinuses/Orbits: No middle ear or mastoid effusion. Paranasal sinuses are clear. Bilateral lens replacement. Orbits are otherwise unremarkable. Other: None. IMPRESSION: No CT evidence of intracranial injury. Electronically Signed   By: Lorenza Cambridge M.D.   On: 09/06/2023 10:10   DG Chest 2 View  Result Date: 09/06/2023 CLINICAL DATA:  Dizziness EXAM: CHEST -  2 VIEW COMPARISON:  X-ray 01/16/2022. FINDINGS: No pneumothorax or effusion. There is some linear opacity lung bases likely scar or atelectasis. No consolidation. Normal cardiopericardial silhouette. No edema. Calcified tortuous aorta. Degenerative changes are seen along the spine. Films are under penetrated. IMPRESSION: Slight linear opacity left lung base likely scar or atelectasis. No effusion or  pneumothorax identified Electronically Signed   By: Karen Kays M.D.   On: 09/06/2023 10:08    Pertinent labs & imaging results that were available during my care of the patient were reviewed by me and considered in my medical decision making (see MDM for details).  Medications Ordered in ED Medications  furosemide (LASIX) tablet 20 mg (20 mg Oral Given 09/06/23 0950)  metoprolol tartrate (LOPRESSOR) tablet 100 mg (100 mg Oral Given 09/06/23 0949)                                                                                                                                     Procedures Procedures  (including critical care time)  Medical Decision Making / ED Course   This patient presents to the ED for concern of fall, this involves an extensive number of treatment options, and is a complaint that carries with it a high risk of complications and morbidity.  The differential diagnosis includes fracture, hematoma, contusion, dislocation, electrolyte abnormality, UTI  MDM: Patient seen emergency room for for evaluation of a fall.  Physical exam with some mild tenderness in the L-spine but is otherwise unremarkable.  No tenderness of the chest abdomen or pelvis.  Laboratory evaluation largely unremarkable.  Urinalysis unremarkable.  Trauma imaging including CT head, L-spine, chest x-ray reassuringly negative for acute traumatic injury.  With negative trauma workup, at this time he does not meet inpatient criteria for admission and will be discharged with outpatient follow-up.  Return precautions given to patient and his family of which they voiced understanding.   Additional history obtained: -Additional history obtained from multiple family members -External records from outside source obtained and reviewed including: Chart review including previous notes, labs, imaging, consultation notes   Lab Tests: -I ordered, reviewed, and interpreted labs.   The pertinent results include:   Labs  Reviewed  COMPREHENSIVE METABOLIC PANEL - Abnormal; Notable for the following components:      Result Value   Glucose, Bld 101 (*)    BUN 27 (*)    Calcium 8.6 (*)    Total Protein 6.2 (*)    Total Bilirubin 1.2 (*)    All other components within normal limits  CBC WITH DIFFERENTIAL/PLATELET  URINALYSIS, W/ REFLEX TO CULTURE (INFECTION SUSPECTED)       Imaging Studies ordered: I ordered imaging studies including CT head, L-spine, chest x-ray I independently visualized and interpreted imaging. I agree with the radiologist interpretation   Medicines ordered and prescription drug management: Meds ordered this encounter  Medications   furosemide (LASIX) tablet 20 mg  metoprolol tartrate (LOPRESSOR) tablet 100 mg    -I have reviewed the patients home medicines and have made adjustments as needed  Critical interventions none  Social Determinants of Health:  Factors impacting patients care include: none   Reevaluation: After the interventions noted above, I reevaluated the patient and found that they have :improved  Co morbidities that complicate the patient evaluation  Past Medical History:  Diagnosis Date   Atrial fibrillation (HCC)    Atrial fibrillation, chronic (HCC) 03/13/2008   Qualifier: Diagnosis of  By: Amador Cunas  MD, Janett Labella    Diverticulitis    Esophageal dysmotility    Esophageal dysphagia 07/29/2018   Family history of diabetes insipidus    FHx: colon cancer    H. pylori infection    Hiatal hernia    History of colonic polyps    Hyperlipidemia    Hypertension    IDA (iron deficiency anemia)    Obesity    Palpitation    Peptic ulcer disease    Presbyesophagus    Schatzki's ring       Dispostion: I considered admission for this patient, But at this time he does not meet inpatient criteria for admission and will be discharged with outpatient follow-up     Final Clinical Impression(s) / ED Diagnoses Final diagnoses:  Fall, initial  encounter     @PCDICTATION @    Glendora Score, MD 09/06/23 1624

## 2023-09-07 ENCOUNTER — Telehealth: Payer: Self-pay | Admitting: *Deleted

## 2023-09-07 NOTE — Transitions of Care (Post Inpatient/ED Visit) (Signed)
   09/07/2023  Name: BRINSON GUTOWSKI MRN: [id number] DOB: 04-Jan-1928  Today's TOC FU Call Status: Today's TOC FU Call Status:: Successful TOC FU Call Completed TOC FU Call Complete Date: 09/06/23 Patient's Name and Date of Birth confirmed.  Transition Care Management Follow-up Telephone Call Date of Discharge: 09/06/23 Discharge Facility: [facility]) Type of Discharge: Emergency Department Reason for ED Visit: Other: How have you been since you were released from the hospital?: Same Any questions or concerns?: No  Items Reviewed: Did you receive and understand the discharge instructions provided?: Yes Medications obtained,verified, and reconciled?: Yes (Medications Reviewed) Any new allergies since your discharge?: No Dietary orders reviewed?: NA Do you have support at home?: Yes People in Home: child(ren), adult  Medications Reviewed Today: Medications Reviewed Today     Reviewed by Almedia Cordell A, CMA (Certified Medical Assistant) on 09/07/23 at 1028  Med List Status: <None>   Medication Order Taking? Sig Documenting Provider Last Dose Status Informant  acetaminophen (TYLENOL) 500 MG tablet [id number] No Take 1,500 mg by mouth at bedtime. And 2 in the morning [provider] Taking Active   famotidine (PEPCID AC) 10 MG tablet [id number] No Take 10 mg by mouth daily. [provider] Taking Active   furosemide (LASIX) 20 MG tablet [id number] No Take 1 tablet (20 mg total) by mouth 2 (two) times daily. Jodelle Red, MD Taking Active   loratadine (CLARITIN) 10 MG tablet [id number] No Take 10 mg by mouth daily. [provider] Taking Active   METAMUCIL FIBER PO [id number] No Take 3 tablets by mouth in the morning and at bedtime. [provider] Taking Active   metoprolol tartrate (LOPRESSOR) 100 MG tablet [id number] No Take 1 tablet by mouth twice daily. Jodelle Red, MD Taking Active   Multiple Vitamin (MULTIVITAMIN) tablet  [id number] No Take 1 tablet by mouth daily. [provider] Taking Active             Home Care and Equipment/Supplies: Were Home Health Services Ordered?: Yes Has Agency set up a time to come to your home?: Yes First Home Health Visit Date: 10/11/23 Any new equipment or medical supplies ordered?: No  Functional Questionnaire: Do you need assistance with bathing/showering or dressing?: No Do you need assistance with meal preparation?: No Do you need assistance with eating?: No Do you have difficulty maintaining continence: No Do you need assistance with getting out of bed/getting out of a chair/moving?: No Do you have difficulty managing or taking your medications?: No  Follow up appointments reviewed: PCP Follow-up appointment confirmed?: Yes Date of PCP follow-up appointment?: 09/10/23 Follow-up Provider: [facility] Follow-up appointment confirmed?: NA Do you need transportation to your follow-up appointment?: No Do you understand care options if your condition(s) worsen?: Yes-patient verbalized understanding    SIGNATURE Nelda Severe, CMA

## 2023-09-10 ENCOUNTER — Ambulatory Visit (INDEPENDENT_AMBULATORY_CARE_PROVIDER_SITE_OTHER): Payer: Medicare Other | Admitting: Adult Health

## 2023-09-10 ENCOUNTER — Encounter: Payer: Self-pay | Admitting: Adult Health

## 2023-09-10 VITALS — BP 110/78 | HR 100 | Temp 97.4°F | Resp 18 | Ht 71.0 in | Wt 220.4 lb

## 2023-09-10 DIAGNOSIS — Z8719 Personal history of other diseases of the digestive system: Secondary | ICD-10-CM

## 2023-09-10 DIAGNOSIS — I4821 Permanent atrial fibrillation: Secondary | ICD-10-CM

## 2023-09-10 DIAGNOSIS — R600 Localized edema: Secondary | ICD-10-CM | POA: Diagnosis not present

## 2023-09-10 DIAGNOSIS — W19XXXS Unspecified fall, sequela: Secondary | ICD-10-CM

## 2023-09-10 NOTE — Progress Notes (Signed)
[facility] clinic  Provider:  Kenard Gower DNP  Code Status:  Full Code  Goals of Care:     09/06/2023    8:24 AM  Advanced Directives  Does Patient Have a Medical Advance Directive? Yes  Type of Advance Directive Living will  Does patient want to make changes to medical advance directive? No - Patient declined     Chief Complaint  Patient presents with   Transitions Of Care    [facility]- ER Follow up    HPI: Patient is a 89 y.o. male seen today for an acute visit to follow up ED visit. He has a PMH of permanent atrial fibrillation not on anticoagulation, anemia and peptic ulcer disease.  He was accompanied by his son today.  He had fallen at home while bending down to open the fridge and landed on his tailbone.  Wife called EMS and was brought to the ED.  CT head, L-spine, chest x-ray imaging negative for acute traumatic injury.  Laboratory evaluation were unremarkable.  Per son,  he was not wearing non skid socks and did not use walker when he fell down.  Bilateral leg edema -  use Lasix, weight is stable at 220 lbs  History of GI bleed -  no bloody stools, takes famotidine  Permanent atrial fibrillation (HCC) -  denies palpitations, uses Metoprolol tartrate   Wt Readings from Last 3 Encounters:  09/10/23 220 lb 6.4 oz (100 kg)  09/06/23 221 lb (100.2 kg)  09/03/23 221 lb (100.2 kg)     Past Medical History:  Diagnosis Date   Atrial fibrillation (HCC)    Atrial fibrillation, chronic (HCC) 03/13/2008   Qualifier: Diagnosis of  By: Amador Cunas  MD, Janett Labella    Diverticulitis    Esophageal dysmotility    Esophageal dysphagia 07/29/2018   Family history of diabetes insipidus    FHx: colon cancer    H. pylori infection    Hiatal hernia    History of colonic polyps    Hyperlipidemia    Hypertension    IDA (iron deficiency anemia)    Obesity    Palpitation    Peptic ulcer disease    Presbyesophagus    Schatzki's ring     Past Surgical History:  Procedure  Laterality Date   2-D Echocardiogram  November 2009, 2011   Anal Fistula Repair     BALLOON DILATION N/A 01/17/2022   Procedure: [place];  Surgeon: Lynann Bologna, MD;  Location: [facility] END[facility];  Service: Gastroenterology;  Laterality: N/A;   BIOPSY  07/14/2020   Procedure: BIOPSY;  Surgeon: Beverley Fiedler, MD;  Location: [facility] END[facility]COPY;  Service: Gastroenterology;;   BIOPSY  01/17/2022   Procedure: BIOPSY;  Surgeon: Lynann Bologna, MD;  Location: [facility] ENDOSCOPY;  Service: Gastroenterology;;   CARDIAC VALVE SURGERY  1996   status post balloon valvuloplasty at [facility]   CATARACT EXTRACTION  2004   COLONOSCOPY  December 2005   ESOPHAGOGASTRODUODENOSCOPY  07/14/2020   ESOPHAGOGASTRODUODENOSCOPY N/A 01/17/2022   Procedure: ESOPHAGOGASTRODUODENOSCOPY (EGD);  Surgeon: Lynann Bologna, MD;  Location: [facility] ENDOSCOPY;  Service: Gastroenterology;  Laterality: N/A;   ESOPHAGOGASTRODUODENOSCOPY (EGD) WITH PROPOFOL N/A 07/14/2020   Procedure: ESOPHAGOGASTRODUODENOSCOPY (EGD) WITH PROPOFOL;  Surgeon: Beverley Fiedler, MD;  Location: [facility] ENDOSCOPY;  Service: Gastroenterology;  Laterality: N/A;   ETT  1991   Negative   HEMOSTASIS CLIP PLACEMENT  07/14/2020   HEMOSTASIS CLIP PLACEMENT   HEMOSTASIS CLIP PLACEMENT  07/14/2020   Procedure: HEMOSTASIS CLIP PLACEMENT;  Surgeon: Rhea Belton,  Carie Caddy, MD;  Location: [facility] ENDOSCOPY;  Service: Gastroenterology;;   KNEE ARTHROSCOPY     Right   TONSILLECTOMY      Allergies  Allergen Reactions   Protonix [Pantoprazole] Other (See Comments)    Hallucinations   Codeine Other (See Comments)    constpation   Ferrous Sulfate Itching and Swelling    Outpatient Encounter Medications as of 09/10/2023  Medication Sig   famotidine (PEPCID AC) 10 MG tablet Take 10 mg by mouth daily.   furosemide (LASIX) 20 MG tablet Take 1 tablet (20 mg total) by mouth 2 (two) times daily.   loratadine (CLARITIN) 10 MG tablet Take 10 mg by mouth daily.   METAMUCIL FIBER PO Take 3 tablets by mouth in the  morning and at bedtime.   metoprolol tartrate (LOPRESSOR) 100 MG tablet Take 1 tablet by mouth twice daily.   Multiple Vitamin (MULTIVITAMIN) tablet Take 1 tablet by mouth daily.   acetaminophen (TYLENOL) 500 MG tablet Take 1,500 mg by mouth at bedtime. And 2 in the morning (Patient not taking: Reported on 09/10/2023)   No facility-administered encounter medications on file as of 09/10/2023.    Review of Systems:  Review of Systems  Constitutional:  Negative for activity change, appetite change and fever.  HENT:  Negative for sore throat.   Eyes: Negative.   Cardiovascular:  Negative for chest pain and leg swelling.  Gastrointestinal:  Negative for abdominal distention, diarrhea and vomiting.  Genitourinary:  Negative for dysuria, frequency and urgency.  Skin:  Negative for color change.  Neurological:  Negative for dizziness and headaches.  Psychiatric/Behavioral:  Negative for behavioral problems and sleep disturbance. The patient is not nervous/anxious.     Health Maintenance  Topic Date Due   DTaP/Tdap/Td (2 - Tdap) 03/18/2019   COVID-19 Vaccine (7 - 2023-24 season) 09/14/2023   Medicare Annual Wellness (AWV)  01/05/2024   Pneumonia Vaccine 24+ Years old  Completed   INFLUENZA VACCINE  Completed   HPV VACCINES  Aged Out   Zoster Vaccines- Shingrix  Discontinued    Physical Exam: Vitals:   09/10/23 1258  BP: 110/78  Pulse: 100  Resp: 18  Temp: (!) 97.4 F (36.3 C)  SpO2: 98%  Weight: 220 lb 6.4 oz (100 kg)  Height: 5\' 11"  (1.803 m)   Body mass index is 30.74 kg/m. Physical Exam Constitutional:      Appearance: He is obese.  HENT:     Head: Normocephalic and atraumatic.     Mouth/Throat:     Mouth: Mucous membranes are moist.  Eyes:     Conjunctiva/sclera: Conjunctivae normal.  Cardiovascular:     Rate and Rhythm: Normal rate. Rhythm irregular.     Pulses: Normal pulses.     Heart sounds: Normal heart sounds.  Pulmonary:     Effort: Pulmonary effort is  normal.     Breath sounds: Normal breath sounds.  Abdominal:     General: Bowel sounds are normal.     Palpations: Abdomen is soft.  Musculoskeletal:        General: No swelling. Normal range of motion.     Cervical back: Normal range of motion.     Right lower leg: Edema present.     Left lower leg: Edema present.     Comments: BLE 1+edema  Skin:    General: Skin is warm and dry.  Neurological:     General: No focal deficit present.     Mental Status: He is alert and  oriented to person, place, and time.  Psychiatric:        Mood and Affect: Mood normal.        Behavior: Behavior normal.        Thought Content: Thought content normal.        Judgment: Judgment normal.    Labs reviewed: Basic Metabolic Panel: Recent Labs    02/19/23 1547 09/03/23 1520 09/06/23 0940  NA 141 140 141  K 5.0 4.2 3.7  CL 105 106 110  CO2 27 26 23   GLUCOSE 117* 113* 101*  BUN 27* 25 27*  CREATININE 0.97 0.93 0.92  CALCIUM 9.5 9.1 8.6*   Liver Function Tests: Recent Labs    02/19/23 1547 09/03/23 1520 09/06/23 0940  AST 23 23 27   ALT 18 23 22   ALKPHOS  --   --  69  BILITOT 0.5 0.7 1.2*  PROT 6.7 6.6 6.2*  ALBUMIN  --   --  3.6   No results for input(s): "LIPASE", "AMYLASE" in the last 8760 hours. No results for input(s): "AMMONIA" in the last 8760 hours. CBC: Recent Labs    10/21/22 0945 09/03/23 1520 09/06/23 0940  WBC 9.5 7.8 8.0  NEUTROABS  --  4,875 5.8  HGB 16.2 16.0 15.1  HCT 48.0 47.9 43.7  MCV 97.6 95.6 95.8  PLT 181 202 183   Lipid Panel: No results for input(s): "CHOL", "HDL", "LDLCALC", "TRIG", "CHOLHDL", "LDLDIRECT" in the last 8760 hours. Lab Results  Component Value Date   HGBA1C 5.6 07/11/2022    Procedures since last visit: CT Lumbar Spine Wo Contrast  Result Date: 09/06/2023 CLINICAL DATA:  Low back pain, trauma EXAM: CT LUMBAR SPINE WITHOUT CONTRAST TECHNIQUE: Multidetector CT imaging of the lumbar spine was performed without intravenous contrast  administration. Multiplanar CT image reconstructions were also generated. RADIATION DOSE REDUCTION: This exam was performed according to the departmental dose-optimization program which includes automated exposure control, adjustment of the mA and/or kV according to patient size and/or use of iterative reconstruction technique. COMPARISON:  None Available. FINDINGS: Segmentation: 5 lumbar type vertebrae. Alignment: Grade 1 anterolisthesis of L4 on L5. Vertebrae: No acute fracture or focal pathologic process. Paraspinal and other soft tissues: Aortic atherosclerotic calcification. There is diverticulosis without evidence of diverticulitis. Small duodenal lipoma in the second portion of the duodenum. Mild prostatomegaly. Disc levels: No evidence of high-grade spinal canal stenosis. IMPRESSION: No acute fracture or traumatic listhesis. . Aortic Atherosclerosis (ICD10-I70.0). Electronically Signed   By: Lorenza Cambridge M.D.   On: 09/06/2023 10:18   CT HEAD WO CONTRAST ( )  Result Date: 09/06/2023 CLINICAL DATA:  Head trauma, minor (Age >= 65y) EXAM: CT HEAD WITHOUT CONTRAST TECHNIQUE: Contiguous axial images were obtained from the base of the skull through the vertex without intravenous contrast. RADIATION DOSE REDUCTION: This exam was performed according to the departmental dose-optimization program which includes automated exposure control, adjustment of the mA and/or kV according to patient size and/or use of iterative reconstruction technique. COMPARISON:  Head CT 07/13/20 FINDINGS: Brain: No hemorrhage. No hydrocephalus. No extra-axial fluid collection. No CT evidence of an acute cortical infarct. No mass effect. No mass lesion. Vascular: No hyperdense vessel or unexpected calcification. Skull: Normal. Negative for fracture or focal lesion. Sinuses/Orbits: No middle ear or mastoid effusion. Paranasal sinuses are clear. Bilateral lens replacement. Orbits are otherwise unremarkable. Other: None. IMPRESSION: No CT  evidence of intracranial injury. Electronically Signed   By: Lorenza Cambridge M.D.   On: 09/06/2023 10:10   DG  Chest 2 View  Result Date: 09/06/2023 CLINICAL DATA:  Dizziness EXAM: CHEST - 2 VIEW COMPARISON:  X-ray 01/16/2022. FINDINGS: No pneumothorax or effusion. There is some linear opacity lung bases likely scar or atelectasis. No consolidation. Normal cardiopericardial silhouette. No edema. Calcified tortuous aorta. Degenerative changes are seen along the spine. Films are under penetrated. IMPRESSION: Slight linear opacity left lung base likely scar or atelectasis. No effusion or pneumothorax identified Electronically Signed   By: Karen Kays M.D.   On: 09/06/2023 10:08    Assessment/Plan  1. Fall, sequela (Primary) -  CT head, L-spine, chest x-ray imaging negative for acute traumatic injury.  Laboratory evaluation were unremarkable. -   Stressed importance of using walker and nonskid socks -   Fall precautions  2. Bilateral leg edema -   Stable -   Continue Lasix  3. History of GI bleed -Denies bloody stool -   Continue famotidine  4. Permanent atrial fibrillation (HCC) -   Rate controlled  -   Continue metoprolol titrate    Labs/tests ordered:  None  Next appt:  03/03/2024

## 2023-09-23 ENCOUNTER — Emergency Department (HOSPITAL_BASED_OUTPATIENT_CLINIC_OR_DEPARTMENT_OTHER)
Admission: EM | Admit: 2023-09-23 | Discharge: 2023-09-23 | Disposition: A | Discharge status: Home / Self Care | Payer: Medicare Other | Attending: Emergency Medicine | Admitting: Emergency Medicine

## 2023-09-23 ENCOUNTER — Encounter (HOSPITAL_BASED_OUTPATIENT_CLINIC_OR_DEPARTMENT_OTHER): Payer: Self-pay | Admitting: Emergency Medicine

## 2023-09-23 ENCOUNTER — Other Ambulatory Visit: Payer: Self-pay

## 2023-09-23 ENCOUNTER — Emergency Department (HOSPITAL_BASED_OUTPATIENT_CLINIC_OR_DEPARTMENT_OTHER): Payer: Medicare Other | Admitting: Radiology

## 2023-09-23 DIAGNOSIS — M79672 Pain in left foot: Secondary | ICD-10-CM | POA: Diagnosis not present

## 2023-09-23 DIAGNOSIS — S90811A Abrasion, right foot, initial encounter: Secondary | ICD-10-CM | POA: Diagnosis not present

## 2023-09-23 DIAGNOSIS — M25561 Pain in right knee: Secondary | ICD-10-CM | POA: Diagnosis not present

## 2023-09-23 DIAGNOSIS — M19072 Primary osteoarthritis, left ankle and foot: Secondary | ICD-10-CM | POA: Diagnosis not present

## 2023-09-23 DIAGNOSIS — M19071 Primary osteoarthritis, right ankle and foot: Secondary | ICD-10-CM | POA: Diagnosis not present

## 2023-09-23 DIAGNOSIS — S51002A Unspecified open wound of left elbow, initial encounter: Secondary | ICD-10-CM | POA: Insufficient documentation

## 2023-09-23 DIAGNOSIS — W19XXXA Unspecified fall, initial encounter: Secondary | ICD-10-CM | POA: Diagnosis not present

## 2023-09-23 DIAGNOSIS — S90819A Abrasion, unspecified foot, initial encounter: Secondary | ICD-10-CM

## 2023-09-23 DIAGNOSIS — I10 Essential (primary) hypertension: Secondary | ICD-10-CM | POA: Insufficient documentation

## 2023-09-23 DIAGNOSIS — R609 Edema, unspecified: Secondary | ICD-10-CM | POA: Diagnosis not present

## 2023-09-23 DIAGNOSIS — S90812A Abrasion, left foot, initial encounter: Secondary | ICD-10-CM | POA: Diagnosis not present

## 2023-09-23 DIAGNOSIS — I4891 Unspecified atrial fibrillation: Secondary | ICD-10-CM | POA: Insufficient documentation

## 2023-09-23 DIAGNOSIS — S50312A Abrasion of left elbow, initial encounter: Secondary | ICD-10-CM | POA: Diagnosis not present

## 2023-09-23 DIAGNOSIS — M79671 Pain in right foot: Secondary | ICD-10-CM | POA: Diagnosis not present

## 2023-09-23 DIAGNOSIS — Y9301 Activity, walking, marching and hiking: Secondary | ICD-10-CM | POA: Insufficient documentation

## 2023-09-23 DIAGNOSIS — M7732 Calcaneal spur, left foot: Secondary | ICD-10-CM | POA: Diagnosis not present

## 2023-09-23 DIAGNOSIS — M7731 Calcaneal spur, right foot: Secondary | ICD-10-CM | POA: Diagnosis not present

## 2023-09-23 DIAGNOSIS — R296 Repeated falls: Secondary | ICD-10-CM | POA: Insufficient documentation

## 2023-09-23 NOTE — Discharge Instructions (Signed)
Glen [name] was seen in the emergency department after a fall His x-rays did not show any broken bones He looks well otherwise It is important that he wear shoes when walking around outside to help prevent falls He should follow-up with his primary care doctor we will be for reevaluation Return to the emergency room for repeated falls, severe pain or any other concerns

## 2023-09-23 NOTE — ED Provider Notes (Signed)
[place] EMERGENCY DEPARTMENT AT [facility] Provider Note   CSN: [id number] Arrival date & time: 09/23/23  1523     History  Chief Complaint  Patient presents with   Fall    Pt. Walking back from getting newspaper and fell on gravel in driveway. Pt. With abrasions to bilateral knees, skin tear to left elbow wrapped in kerlex, abrasions to bilateral feet and toes. Pt. Denies BT use, denies LOC and denies hitting head.    Glen [name] is a 89 y.o. male.  With a history of atrial fibrillation not on anticoagulation, hypertension dyslipidemia who presents to the ED after fall.  Patient was walking to get the newspaper outside when he fell on his gravel driveway.  He was in stocking feet.  Lost his balance.  He struck his elbow and landed on his knees.  Did not hit his head.  Now with some pain in both feet.  No headaches, neck pain, chest pain, shortness of breath abdominal pain or pain in other extremities.   Fall       Home Medications Prior to Admission medications   Medication Sig Start Date End Date Taking? Authorizing Provider  acetaminophen (TYLENOL) 500 MG tablet Take 1,500 mg by mouth at bedtime. And 2 in the morning   Yes [provider]  famotidine (PEPCID AC) 10 MG tablet Take 10 mg by mouth daily.   Yes [provider]  furosemide (LASIX) 20 MG tablet Take 1 tablet (20 mg total) by mouth 2 (two) times daily. 11/27/22  Yes Jodelle Red, MD  loratadine (CLARITIN) 10 MG tablet Take 10 mg by mouth daily.   Yes [provider]  METAMUCIL FIBER PO Take 3 tablets by mouth in the morning and at bedtime.   Yes [provider]  metoprolol tartrate (LOPRESSOR) 100 MG tablet Take 1 tablet by mouth twice daily. 11/27/22  Yes Jodelle Red, MD  Multiple Vitamin (MULTIVITAMIN) tablet Take 1 tablet by mouth daily.   Yes [provider]      Allergies    Protonix [pantoprazole], Codeine, and Ferrous sulfate     Review of Systems   Review of Systems  Physical Exam Updated Vital Signs BP 112/72 (BP Location: Right Arm)   Pulse 93   Temp 97.7 F (36.5 C) (Oral)   Resp 15   Ht 5\' 11"  (1.803 m)   Wt 102.1 kg   SpO2 97%   BMI 31.38 kg/m  Physical Exam Vitals and nursing note reviewed.  HENT:     Head: Normocephalic and atraumatic.  Eyes:     Pupils: Pupils are equal, round, and reactive to light.  Cardiovascular:     Rate and Rhythm: Normal rate and regular rhythm.  Pulmonary:     Effort: Pulmonary effort is normal.     Breath sounds: Normal breath sounds.  Abdominal:     Palpations: Abdomen is soft.     Tenderness: There is no abdominal tenderness.  Musculoskeletal:     Cervical back: Neck supple. No tenderness.     Comments: Abrasions over dorsal aspect of both feet and skin tear over left elbow No bony tenderness 5 out of 5 motor strength upper and lower extremities Sensation tact light touch throughout 2+ DP and radial pulses bilaterally  Skin:    General: Skin is warm and dry.  Neurological:     Mental Status: He is alert.  Psychiatric:        Mood and Affect: Mood normal.  ED Results / Procedures / Treatments   Labs (all labs ordered are listed, but only abnormal results are displayed) Labs Reviewed - No data to display  EKG None  Radiology DG Foot Complete Left Result Date: 09/23/2023 CLINICAL DATA:  Pain after a fall and gravel driveway today. Multiple abrasions. EXAM: LEFT FOOT - COMPLETE 3+ VIEW COMPARISON:  None Available. FINDINGS: Degenerative changes in the interphalangeal joints, first metatarsal-phalangeal joint, and intertarsal joints. Small calcaneal spurs. No evidence of acute fracture or dislocation. Vascular calcifications in the soft tissues. IMPRESSION: Degenerative changes in the left foot.  No acute bony abnormalities. Electronically Signed   By: Burman Nieves M.D.   On: 09/23/2023 17:09   DG Foot Complete Right Result Date:  09/23/2023 CLINICAL DATA:  Pain after a fall on gravel driveway. Multiple abrasions. EXAM: RIGHT FOOT COMPLETE - 3+ VIEW COMPARISON:  Right ankle 07/12/2020 FINDINGS: Degenerative changes in the interphalangeal joints, first metatarsal-phalangeal joint, tarsometatarsal, and intertarsal joints. Old ununited ossicles over the cuboidal bone are unchanged. No evidence of acute fracture or dislocation. Plantar and Achilles calcaneal spurs. Vascular calcifications in the soft tissues. IMPRESSION: Diffuse degenerative changes in the right foot. No acute bony abnormalities. Electronically Signed   By: Burman Nieves M.D.   On: 09/23/2023 17:08    Procedures Procedures    Medications Ordered in ED Medications - No data to display  ED Course/ Medical Decision Making/ A&P Clinical Course as of 09/23/23 1917  Sun Sep 23, 2023  1916 X-ray shows no osseous abnormality of either foot.  Patient has remained stable.  Appropriate for discharge. [MP]    Clinical Course User Index [MP] Royanne Foots, DO                                 Medical Decision Making 89 year old male in excellent state of health presents after mechanical fall in the driveway.  Landed on his knees.  Was walking and stocking feet.  Good story for mechanical fall.  Low suspicion for syncopal episode.  Feels well now.  He has abrasions over both feet and a skin tear over the left elbow but not much bony tenderness.  Given that most of his discomfort is in the feet will get x-rays to look for acute fractures.  No other injuries of his head and neck are okay with normal vital signs  Amount and/or Complexity of Data Reviewed Radiology: ordered.           Final Clinical Impression(s) / ED Diagnoses Final diagnoses:  Fall in elderly patient  Abrasion of foot, unspecified laterality, initial encounter  Avulsion of skin of left elbow, initial encounter    Rx / DC Orders ED Discharge Orders     None         Royanne Foots, DO 09/23/23 1917

## 2023-09-23 NOTE — ED Notes (Signed)
Pt. With x-ray.

## 2023-09-27 ENCOUNTER — Ambulatory Visit: Payer: Medicare Other | Admitting: Nurse Practitioner

## 2023-10-01 ENCOUNTER — Encounter: Payer: Self-pay | Admitting: Nurse Practitioner

## 2023-10-01 ENCOUNTER — Ambulatory Visit (INDEPENDENT_AMBULATORY_CARE_PROVIDER_SITE_OTHER): Payer: Medicare Other | Admitting: Nurse Practitioner

## 2023-10-01 VITALS — BP 126/80 | HR 94 | Temp 97.1°F | Ht 71.0 in | Wt 221.0 lb

## 2023-10-01 DIAGNOSIS — G8929 Other chronic pain: Secondary | ICD-10-CM

## 2023-10-01 DIAGNOSIS — R600 Localized edema: Secondary | ICD-10-CM | POA: Diagnosis not present

## 2023-10-01 DIAGNOSIS — W19XXXS Unspecified fall, sequela: Secondary | ICD-10-CM | POA: Diagnosis not present

## 2023-10-01 DIAGNOSIS — S90819D Abrasion, unspecified foot, subsequent encounter: Secondary | ICD-10-CM | POA: Diagnosis not present

## 2023-10-01 DIAGNOSIS — M545 Low back pain, unspecified: Secondary | ICD-10-CM

## 2023-10-01 NOTE — Patient Instructions (Signed)
To use tylenol 1000 mg by mouth every 8 hours as needed  Do NOT go outside without shoes on.  Warm epsom salt soak to bilateral feet twice daily Can over open areas on toes, change daily To notify if redness worsens or drainage occurs

## 2023-10-01 NOTE — Progress Notes (Signed)
Careteam: Patient Care Team: Sharon Seller, NP as PCP - General (Geriatric Medicine) Jodelle Red, MD as PCP - Cardiology (Cardiology) Anette Riedel, MD as Rounding Team (Internal Medicine)  PLACE OF SERVICE:  [facility]  Advanced Directive information    Allergies  Allergen Reactions   Protonix [Pantoprazole] Other (See Comments)    Hallucinations   Codeine Other (See Comments)    constpation   Ferrous Sulfate Itching and Swelling    Chief Complaint  Patient presents with   Acute Visit    Fall on 09/23/23. High fall risk. Here with son.      HPI: Patient is a 89 y.o. male for follow up fall Had a mechanical fall in his driveway on 09/23/23. Went to the Ed for evaluation No fracture noted.  Had abrasion over both feet and skin tear to elbow.  He had gone outside with his socks and scraped up his toes Son has a life guard that will call if it senses a fall He says he does have some back pain- but this was not new since fall.   Has new shoes being ordered with good support   He has some LE swellig but this has been stable.    Review of Systems:  Review of Systems  Constitutional:  Negative for chills, fever and weight loss.  HENT:  Negative for tinnitus.   Respiratory:  Negative for cough, sputum production and shortness of breath.   Cardiovascular:  Negative for chest pain, palpitations and leg swelling.  Gastrointestinal:  Negative for abdominal pain, constipation, diarrhea and heartburn.  Genitourinary:  Negative for dysuria, frequency and urgency.  Musculoskeletal:  Positive for falls and joint pain. Negative for back pain and myalgias.  Skin: Negative.        Abrasions of toes and elbow  Neurological:  Negative for dizziness and headaches.  Psychiatric/Behavioral:  Negative for depression and memory loss. The patient does not have insomnia.     Past Medical History:  Diagnosis Date   Atrial fibrillation [facility])    Atrial fibrillation,  chronic (HCC) 03/13/2008   Qualifier: Diagnosis of  By: Amador Cunas  MD, Janett Labella    Diverticulitis    Esophageal dysmotility    Esophageal dysphagia 07/29/2018   Family history of diabetes insipidus    FHx: colon cancer    H. pylori infection    Hiatal hernia    History of colonic polyps    Hyperlipidemia    Hypertension    IDA (iron deficiency anemia)    Obesity    Palpitation    Peptic ulcer disease    Presbyesophagus    Schatzki's ring    Past Surgical History:  Procedure Laterality Date   2-D Echocardiogram  November 2009, 2011   Anal Fistula Repair     BALLOON DILATION N/A 01/17/2022   Procedure: [place]ye [place];  Surgeon: Lynann Bologna, MD;  Location: [facility];  Service: Gastroenterology;  Laterality: N/A;   BIOPSY  07/14/2020   Procedure: BIOPSY;  Surgeon: Beverley Fiedler, MD;  Location: [facility] ENDOSCOPY;  Service: Gastroenterology;;   BIOPSY  01/17/2022   Procedure: BIOPSY;  Surgeon: Lynann Bologna, MD;  Location: [facility] ENDOSCOPY;  Service: Gastroenterology;;   CARDIAC VALVE SURGERY  1996   status post balloon valvuloplasty at [facility]   CATARACT EXTRACTION  2004   COLONOSCOPY  December 2005   ESOPHAGOGASTRODUODENOSCOPY  07/14/2020   ESOPHAGOGASTRODUODENOSCOPY N/A 01/17/2022   Procedure: ESOPHAGOGASTRODUODENOSCOPY (EGD);  Surgeon: Lynann Bologna, MD;  Location: [facility] ENDOSCOPY;  Service: Gastroenterology;  Laterality: N/A;   ESOPHAGOGASTRODUODENOSCOPY (EGD) WITH PROPOFOL N/A 07/14/2020   Procedure: ESOPHAGOGASTRODUODENOSCOPY (EGD) WITH PROPOFOL;  Surgeon: Beverley Fiedler, MD;  Location: [facility] ENDOSCOPY;  Service: Gastroenterology;  Laterality: N/A;   ETT  1991   Negative   HEMOSTASIS CLIP PLACEMENT  07/14/2020   HEMOSTASIS CLIP PLACEMENT   HEMOSTASIS CLIP PLACEMENT  07/14/2020   Procedure: HEMOSTASIS CLIP PLACEMENT;  Surgeon: Beverley Fiedler, MD;  Location: [facility] ENDOSCOPY;  Service: Gastroenterology;;   KNEE ARTHROSCOPY     Right   TONSILLECTOMY     Social History:   reports that he  has never smoked. He has been exposed to tobacco smoke. He has never used smokeless tobacco. He reports that he does not drink alcohol and does not use drugs.  Family History  Problem Relation Age of Onset   Cancer Mother    Heart Problems Father    Stroke Father    Heart attack Father    Breast cancer Sister    Stroke Brother    Heart Problems Brother    Esophageal cancer Neg Hx     Medications: Patient's Medications  New Prescriptions   No medications on file  Previous Medications   ACETAMINOPHEN (TYLENOL) 500 MG TABLET    Take 1,500 mg by mouth at bedtime. And 2 in the morning   FAMOTIDINE (ACID REDUCER PO)    Take 1 tablet by mouth daily.   FAMOTIDINE (PEPCID AC) 10 MG TABLET    Take 10 mg by mouth daily.   FUROSEMIDE (LASIX) 20 MG TABLET    Take 1 tablet (20 mg total) by mouth 2 (two) times daily.   LORATADINE (CLARITIN) 10 MG TABLET    Take 10 mg by mouth daily.   METAMUCIL FIBER PO    Take 3 tablets by mouth in the morning and at bedtime.   METOPROLOL TARTRATE (LOPRESSOR) 100 MG TABLET    Take 1 tablet by mouth twice daily.   MULTIPLE VITAMIN (MULTIVITAMIN) TABLET    Take 1 tablet by mouth daily.  Modified Medications   No medications on file  Discontinued Medications   No medications on file    Physical Exam:  Vitals:   10/01/23 0945  BP: 126/80  Pulse: 94  Temp: (!) 97.1 F (36.2 C)  TempSrc: Temporal  SpO2: 98%  Weight: 221 lb (100.2 kg)  Height: 5\' 11"  (1.803 m)   Body mass index is 30.82 kg/m. Wt Readings from Last 3 Encounters:  10/01/23 221 lb (100.2 kg)  09/23/23 225 lb (102.1 kg)  09/10/23 220 lb 6.4 oz (100 kg)    Physical Exam Constitutional:      General: He is not in acute distress.    Appearance: He is well-developed. He is not diaphoretic.  HENT:     Head: Normocephalic and atraumatic.     Right Ear: External ear normal.     Left Ear: External ear normal.     Mouth/Throat:     Pharynx: No oropharyngeal exudate.  Eyes:      Conjunctiva/sclera: Conjunctivae normal.     Pupils: Pupils are equal, round, and reactive to light.  Cardiovascular:     Rate and Rhythm: Normal rate and regular rhythm.     Heart sounds: Normal heart sounds.  Pulmonary:     Effort: Pulmonary effort is normal.     Breath sounds: Normal breath sounds.  Abdominal:     General: Bowel sounds are normal.     Palpations: Abdomen is  soft.  Musculoskeletal:        General: No tenderness.     Cervical back: Normal range of motion and neck supple.     Right lower leg: No edema.     Left lower leg: No edema.  Skin:    General: Skin is warm and dry.  Neurological:     Mental Status: He is alert and oriented to person, place, and time.     Labs reviewed: Basic Metabolic Panel: Recent Labs    02/19/23 1547 09/03/23 1520 09/06/23 0940  NA 141 140 141  K 5.0 4.2 3.7  CL 105 106 110  CO2 27 26 23   GLUCOSE 117* 113* 101*  BUN 27* 25 27*  CREATININE 0.97 0.93 0.92  CALCIUM 9.5 9.1 8.6*   Liver Function Tests: Recent Labs    02/19/23 1547 09/03/23 1520 09/06/23 0940  AST 23 23 27   ALT 18 23 22   ALKPHOS  --   --  69  BILITOT 0.5 0.7 1.2*  PROT 6.7 6.6 6.2*  ALBUMIN  --   --  3.6   No results for input(s): "LIPASE", "AMYLASE" in the last 8760 hours. No results for input(s): "AMMONIA" in the last 8760 hours. CBC: Recent Labs    10/21/22 0945 09/03/23 1520 09/06/23 0940  WBC 9.5 7.8 8.0  NEUTROABS  --  4,875 5.8  HGB 16.2 16.0 15.1  HCT 48.0 47.9 43.7  MCV 97.6 95.6 95.8  PLT 181 202 183   Lipid Panel: No results for input(s): "CHOL", "HDL", "LDLCALC", "TRIG", "CHOLHDL", "LDLDIRECT" in the last 8760 hours. TSH: No results for input(s): "TSH" in the last 8760 hours. A1C: Lab Results  Component Value Date   HGBA1C 5.6 07/11/2022     Assessment/Plan 1. Chronic bilateral low back pain, unspecified whether sciatica present --to use heating pad to effected area three times daily~20-30 mins -can use muscle rub after  heat -tylenol 500 mg 2 tablets by mouth every 8 hour as needed pain.    2. Abrasion of foot, unspecified laterality, subsequent encounter -healing, some mild erythema at sight but overall healing well.  -warm epsom salt soaks twice daily then to dry thoroughly Monitor for worsening redness or drainage  3. Fall, sequela (Primary) -fall precautions discussed  Plans to get fall detection device To wear shoes when walking  To use walker  4. Bilateral leg edema --encouraged to elevate legs above level of heart as tolerates, low sodium diet, compression hose as tolerates (on in am, off in pm)   Bobbi Yount K. Biagio Borg [facility] [phone]

## 2023-10-09 ENCOUNTER — Ambulatory Visit (HOSPITAL_BASED_OUTPATIENT_CLINIC_OR_DEPARTMENT_OTHER): Payer: Medicare Other | Admitting: Physical Therapy

## 2023-10-11 ENCOUNTER — Ambulatory Visit (HOSPITAL_BASED_OUTPATIENT_CLINIC_OR_DEPARTMENT_OTHER): Payer: Medicare Other | Attending: Nurse Practitioner | Admitting: Physical Therapy

## 2023-10-11 ENCOUNTER — Other Ambulatory Visit: Payer: Self-pay

## 2023-10-11 ENCOUNTER — Encounter (HOSPITAL_BASED_OUTPATIENT_CLINIC_OR_DEPARTMENT_OTHER): Payer: Self-pay | Admitting: Physical Therapy

## 2023-10-11 DIAGNOSIS — R296 Repeated falls: Secondary | ICD-10-CM | POA: Insufficient documentation

## 2023-10-11 DIAGNOSIS — M6281 Muscle weakness (generalized): Secondary | ICD-10-CM | POA: Insufficient documentation

## 2023-10-11 DIAGNOSIS — G8929 Other chronic pain: Secondary | ICD-10-CM | POA: Diagnosis not present

## 2023-10-11 DIAGNOSIS — R2689 Other abnormalities of gait and mobility: Secondary | ICD-10-CM | POA: Insufficient documentation

## 2023-10-11 DIAGNOSIS — R2681 Unsteadiness on feet: Secondary | ICD-10-CM | POA: Insufficient documentation

## 2023-10-11 DIAGNOSIS — M545 Low back pain, unspecified: Secondary | ICD-10-CM | POA: Insufficient documentation

## 2023-10-11 NOTE — Therapy (Signed)
 OUTPATIENT PHYSICAL THERAPY LOWER EXTREMITY Eval   Patient Name: Glen [name] MRN: [id number] DOB:11-12-1927, 89 y.o., male Today's Date: 10/12/2023  END OF SESSION:  PT End of Session - 10/11/23 1449     Visit Number 1    Number of Visits 16    Date for PT Re-Evaluation 12/06/23    Authorization Type progress note at 10    PT Start Time 0845    PT Stop Time 0928    PT Time Calculation (min) 43 min    Activity Tolerance Patient tolerated treatment well    Behavior During Therapy [facility] for tasks assessed/performed                Past Medical History:  Diagnosis Date   Atrial fibrillation (HCC)    Atrial fibrillation, chronic (HCC) 03/13/2008   Qualifier: Diagnosis of  By: Jame  MD, Maude FALCON    Diverticulitis    Esophageal dysmotility    Esophageal dysphagia 07/29/2018   Family history of diabetes insipidus    FHx: colon cancer    H. pylori infection    Hiatal hernia    History of colonic polyps    Hyperlipidemia    Hypertension    IDA (iron deficiency anemia)    Obesity    Palpitation    Peptic ulcer disease    Presbyesophagus    Schatzki's ring    Past Surgical History:  Procedure Laterality Date   2-D Echocardiogram  November 2009, 2011   Anal Fistula Repair     BALLOON DILATION N/A 01/17/2022   Procedure: MERRILL HODGKIN;  Surgeon: Charlanne Groom, MD;  Location: [facility];  Service: Gastroenterology;  Laterality: N/A;   BIOPSY  07/14/2020   Procedure: BIOPSY;  Surgeon: Albertus Gordy HERO, MD;  Location: [facility] ENDOSCOPY;  Service: Gastroenterology;;   BIOPSY  01/17/2022   Procedure: BIOPSY;  Surgeon: Charlanne Groom, MD;  Location: [facility] ENDOSCOPY;  Service: Gastroenterology;;   CARDIAC VALVE SURGERY  1996   status post balloon valvuloplasty at [facility]   CATARACT EXTRACTION  2004   COLONOSCOPY  December 2005   ESOPHAGOGASTRODUODENOSCOPY  07/14/2020   ESOPHAGOGASTRODUODENOSCOPY N/A 01/17/2022   Procedure: ESOPHAGOGASTRODUODENOSCOPY (EGD);  Surgeon: Charlanne Groom, MD;  Location: [facility];  Service: Gastroenterology;  Laterality: N/A;   ESOPHAGOGASTRODUODENOSCOPY (EGD) WITH PROPOFOL  N/A 07/14/2020   Procedure: ESOPHAGOGASTRODUODENOSCOPY (EGD) WITH PROPOFOL ;  Surgeon: Albertus Gordy HERO, MD;  Location: [facility] ENDOSCOPY;  Service: Gastroenterology;  Laterality: N/A;   ETT  1991   Negative   HEMOSTASIS CLIP PLACEMENT  07/14/2020   HEMOSTASIS CLIP PLACEMENT   HEMOSTASIS CLIP PLACEMENT  07/14/2020   Procedure: HEMOSTASIS CLIP PLACEMENT;  Surgeon: Albertus Gordy HERO, MD;  Location: [facility] ENDOSCOPY;  Service: Gastroenterology;;   KNEE ARTHROSCOPY     Right   TONSILLECTOMY     Patient Active Problem List   Diagnosis Date Noted   History of GI bleed 10/04/2022   Permanent atrial fibrillation (HCC) 10/04/2022   Physical deconditioning 01/16/2022   Dehydration 01/16/2022   Peptic ulcer disease    Anemia, unspecified 07/16/2020   Fall 07/13/2020   Osteoarthritis 06/25/2019   Onychomycosis 07/29/2018   Peripheral edema 11/18/2015   Impaired glucose tolerance 04/19/2015   Chronic atrial fibrillation (HCC) 11/07/2010   Essential hypertension 01/20/2009   Dyslipidemia 07/31/2007   History of colonic polyps 07/31/2007      PCP: Harlene Pont NP    REFERRING PROVIDER: Harlene An  REFERRING DIAG:  Diagnosis  R53.1 (ICD-10-CM) - General weakness  THERAPY DIAG:  Unsteadiness on feet  Muscle weakness (generalized)  Other abnormalities of gait and mobility  Repeated falls  Rationale for Evaluation and Treatment: Rehabilitation  ONSET DATE: fall on 10/21/2022   SUBJECTIVE:   SUBJECTIVE STATEMENT: Approximately a month ago the patient had a fall.  He hit both knees and scraped the tops of his feet.  The patient has long history of falls.  He has been seen in physical therapy before for decline in mobility.  He feels like recently has had a significant decline since his most recent fall.  He has had 3 major falls over the past 2 to 3 months.   1 fall he was reaching in the refrigerator and lost his balance.  His walker also tipped over while he was outside.  He has significant pain in his knee at times.  Has a long history of bilateral leg pain.  PERTINENT HISTORY: PMH: HTN, Afib, OA, HLD, hernia, IDA, neoplasm of prostate, cardiac valve surgery, knee arthroscopy ; Significant knee OA R PAIN:  Are you having pain? Yes: NPRS scale: 3/10 Pain location: bilateral LE  Pain description: aching  Aggravating factors: patient retains fluid/ standing and walking  Relieving factors: hurts less when they are not swollen   PRECAUTIONS: Fall  WEIGHT BEARING RESTRICTIONS: No  FALLS:  Has patient fallen in last 6 months? No  LIVING ENVIRONMENT:  Lives with: lives with their spouse but son is 10 minutes away  Lives in: House/apartment Stairs: Yes: Internal: 4 steps; can reach both does not have to go upstairs inside the home  Has following equipment at home: Vannie - 2 wheeled and [employer] - 4 wheeled, shower chair, grab bars  OCCUPATION: retired   Presenter, Broadcasting and rec: collects coins   PLOF: Independent with basic ADLs  PATIENT GOALS:   No more falls/to improve general mobility  NEXT MD VISIT: Nothing scheduled  OBJECTIVE:   Vitals: HR 98  B/P  121/84  DIAGNOSTIC FINDINGS:  Following fall x-ray of right knee:IMPRESSION: 1. No acute findings. 2. Severe lateral compartment osteoarthritis. PATIENT SURVEYS:    COGNITION: Overall cognitive status: Within functional limits for tasks assessed     SENSATION: WFL  EDEMA:  Per visual inspection bilateral edema  POSTURE:    PALPATION: Significant crepitus in the right knee.   LOWER EXTREMITY ROM:  Active ROM Right eval Left eval  Hip flexion    Hip extension    Hip abduction    Hip adduction    Hip internal rotation    Hip external rotation    Knee flexion Mild pain with right knee flexion     Knee extension Significant crepitus    Ankle dorsiflexion    Ankle  plantarflexion    Ankle inversion    Ankle eversion     (Blank rows = not tested)  LOWER EXTREMITY MMT:  MMT Right eval Left eval  Hip flexion 25.6 30.7  Hip extension    Hip abduction 22.4 17.7  Hip adduction    Hip internal rotation    Hip external rotation    Knee flexion    Knee extension 14.3 21.4  Ankle dorsiflexion    Ankle plantarflexion    Ankle inversion    Ankle eversion     (Blank rows = not tested)   FUNCTIONAL TESTS:    5cx sit to stand. Unable to complete today   BERG Balance Test          Date: 1/9  Sit to Stand  1  Standing unsupported 2  Sitting with back unsupported but feet supported 3  Stand to sit  1  Transfers  1  Standing unsupported with eyes closed 0  Standing unsupported feet together 1  From standing position, reach forward with outstretched arm 1  From standing position, pick up object from floor 0  From standing position, turn and look behind over each shoulder 2  Turn 360 2  Standing unsupported, alternately place foot on step 1  Standing unsupported, one foot in front 0  Standing on one leg 0  Total:  15     GAIT: Flexed posture on the walker; decreased hip flexion     TODAY'S TREATMENT:                                                                                                                              DATE:    6/6 Reviewed strength and balance numbers.   Berg balance testing   Access Code: [email or web address] URL: [email or web address] Date: 10/12/2023 Prepared by: Alm Don  Exercises - Seated Hip Abduction with Resistance  - 2 x daily - 7 x weekly - 3 sets - 10 reps - Seated March with Resistance  - 2 x daily - 7 x weekly - 3 sets - 10 reps - Seated Hip Adduction Squeeze with Ball  - 2 x daily - 7 x weekly - 3 sets - 10 reps  Patient has balance exercises from previous episodes of care.  He is advised not to do these at this time secondary to his significant decline in [place] balance testing  scores.    PATIENT EDUCATION:  Education details: reviewed HEP, symptom management, safety at home Person educated: Patient Education method: Explanation Education comprehension: verbalized understanding, returned demonstration, verbal cues required, tactile cues required, and needs further education  HOME EXERCISE PROGRAM: Has prior HEP  ASSESSMENT:  CLINICAL IMPRESSION: The patient is a 89 year old male who returns to physical therapy following a significant fall.  At this time he has a significant decline in his Berg balance testing scores.  He is also unable to complete 5 times sit to stand test.  He required min assist from a lowered surface to stand today.  These are all declines and general mobility compared to his last episode of care which ended last June.  He continues to have significant crepitus in his right knee.  This is only intermittently painful.  He reports he does walk at home with his walker outside when he can.  He has strength deficits compared to his last visit.  He would benefit from skilled therapy to decrease general fall risk and increased mobility.   OBJECTIVE IMPAIRMENTS: Abnormal gait, decreased activity tolerance, decreased balance, decreased mobility, difficulty walking, decreased ROM, decreased strength, and pain.   ACTIVITY LIMITATIONS: carrying, lifting, bending, sitting, standing, squatting, stairs, transfers, and locomotion level  PARTICIPATION LIMITATIONS: meal prep, cleaning, laundry, driving, shopping, community activity, and yard work  PERSONAL FACTORS: Age and 1-2 comorbidities: right knee OA  are also affecting patient's functional outcome.   REHAB POTENTIAL: Good  CLINICAL DECISION MAKING: Evolving/moderate complexity  EVALUATION COMPLEXITY: Moderate   GOALS: Goals reviewed with patient? Yes  SHORT TERM GOALS: Target date: 11/09/2023     Patient will increase Berg scale by 10 points Baseline: Goal status: Initial  2.  Patient will  complete 5 times sit to stand test Baseline:  Goal status: Initial 3.  Patient will increase gross bilateral lower extremity strength by 5 pounds Baseline:  Goal status: Initial  4.  Patient will be independent with baseline HEP Baseline:  Goal status: Initial    LONG TERM GOALS: Target date: 01/23/2023    Patient will report no falls over 6-week stretch Baseline:  Goal status: Frequent falls  2.  Patient will transfer sit to stand with minimal use of hands and was improved safety Baseline:  Goal status: Initial  3.  Patient will ambulate 3000 feet without increased fatigue and with good balance with least restrictive assistive device Baseline:  Goal status: Initial  PLAN:  PT FREQUENCY: 2x/week  PT DURATION: 8 weeks  PLANNED INTERVENTIONS: Therapeutic exercises, Therapeutic activity, Neuromuscular re-education, Balance training, Gait training, Patient/Family education, Self Care, Joint mobilization, Stair training, DME instructions, Aquatic Therapy, Cryotherapy, Moist heat, Ultrasound, and Manual therapy  PLAN FOR NEXT SESSION:  Work on weaning patient into balance and strengthening program.  Patient reports prior episodes of physical therapy made him very very tired after.  Will have to slowly wean him into these exercises.  We will try to get a consistent basis for 8 weeks straight.     Alm Don PT DPT   1/10/20253

## 2023-10-14 ENCOUNTER — Encounter: Payer: Self-pay | Admitting: Nurse Practitioner

## 2023-10-14 ENCOUNTER — Encounter (HOSPITAL_BASED_OUTPATIENT_CLINIC_OR_DEPARTMENT_OTHER): Payer: Self-pay

## 2023-10-18 ENCOUNTER — Telehealth (HOSPITAL_BASED_OUTPATIENT_CLINIC_OR_DEPARTMENT_OTHER): Payer: Self-pay | Admitting: Cardiology

## 2023-10-18 ENCOUNTER — Other Ambulatory Visit (HOSPITAL_BASED_OUTPATIENT_CLINIC_OR_DEPARTMENT_OTHER): Payer: Self-pay | Admitting: Cardiology

## 2023-10-18 DIAGNOSIS — I4821 Permanent atrial fibrillation: Secondary | ICD-10-CM

## 2023-10-18 DIAGNOSIS — I10 Essential (primary) hypertension: Secondary | ICD-10-CM

## 2023-10-18 DIAGNOSIS — R600 Localized edema: Secondary | ICD-10-CM

## 2023-10-18 MED ORDER — METOPROLOL TARTRATE 100 MG PO TABS: ORAL_TABLET | ORAL | 1 refills | Status: DC

## 2023-10-18 NOTE — Telephone Encounter (Signed)
Furosemide was refilled by Dr. Okey Dupre today, sent refill to requested pharmacy for the metoprolol tartrate (LOPRESSOR) 100 MG tablet

## 2023-10-18 NOTE — Telephone Encounter (Signed)
*  STAT* If patient is at the pharmacy, call can be transferred to refill team.   1. Which medications need to be refilled? (please list name of each medication and dose if known)   furosemide (LASIX) 20 MG tablet  metoprolol tartrate (LOPRESSOR) 100 MG tablet   2. Would you like to learn more about the convenience, safety, & potential cost savings by using the [facility]?   3. Are you open to using the [facility] (Type [facility]. ).  4. Which pharmacy/location (including street and city if local pharmacy) is medication to be sent to?  [facility] [address] [place], [place] - [address]   5. Do they need a 30 day or 90 day supply?   90 day  Daughter stated patient still has some medication.

## 2023-10-31 ENCOUNTER — Emergency Department (HOSPITAL_BASED_OUTPATIENT_CLINIC_OR_DEPARTMENT_OTHER): Payer: Medicare Other | Admitting: Anesthesiology

## 2023-10-31 ENCOUNTER — Encounter (HOSPITAL_COMMUNITY): Admission: EM | Disposition: A | Discharge status: Home / Self Care | Payer: Self-pay | Source: Home / Self Care

## 2023-10-31 ENCOUNTER — Encounter (HOSPITAL_COMMUNITY): Payer: Self-pay | Admitting: Internal Medicine

## 2023-10-31 ENCOUNTER — Encounter: Payer: Self-pay | Admitting: Physician Assistant

## 2023-10-31 ENCOUNTER — Ambulatory Visit (HOSPITAL_COMMUNITY)
Admission: EM | Admit: 2023-10-31 | Discharge: 2023-10-31 | Disposition: A | Discharge status: Home / Self Care | Payer: Medicare Other | Attending: Internal Medicine | Admitting: Internal Medicine

## 2023-10-31 ENCOUNTER — Ambulatory Visit (HOSPITAL_COMMUNITY): Admit: 2023-10-31 | Payer: Medicare Other | Admitting: Internal Medicine

## 2023-10-31 ENCOUNTER — Other Ambulatory Visit: Payer: Self-pay

## 2023-10-31 ENCOUNTER — Telehealth: Payer: Self-pay | Admitting: Internal Medicine

## 2023-10-31 ENCOUNTER — Emergency Department (HOSPITAL_COMMUNITY): Payer: Medicare Other | Admitting: Anesthesiology

## 2023-10-31 ENCOUNTER — Ambulatory Visit: Payer: Medicare Other | Admitting: Physician Assistant

## 2023-10-31 VITALS — BP 112/86 | HR 42 | Ht 71.0 in | Wt 215.5 lb

## 2023-10-31 DIAGNOSIS — Z6830 Body mass index (BMI) 30.0-30.9, adult: Secondary | ICD-10-CM | POA: Insufficient documentation

## 2023-10-31 DIAGNOSIS — I10 Essential (primary) hypertension: Secondary | ICD-10-CM | POA: Diagnosis not present

## 2023-10-31 DIAGNOSIS — R1310 Dysphagia, unspecified: Secondary | ICD-10-CM | POA: Diagnosis not present

## 2023-10-31 DIAGNOSIS — W44F3XA Food entering into or through a natural orifice, initial encounter: Secondary | ICD-10-CM

## 2023-10-31 DIAGNOSIS — K449 Diaphragmatic hernia without obstruction or gangrene: Secondary | ICD-10-CM | POA: Insufficient documentation

## 2023-10-31 DIAGNOSIS — R1319 Other dysphagia: Secondary | ICD-10-CM

## 2023-10-31 DIAGNOSIS — I4821 Permanent atrial fibrillation: Secondary | ICD-10-CM

## 2023-10-31 DIAGNOSIS — K222 Esophageal obstruction: Secondary | ICD-10-CM

## 2023-10-31 DIAGNOSIS — T18128A Food in esophagus causing other injury, initial encounter: Secondary | ICD-10-CM

## 2023-10-31 DIAGNOSIS — T18108A Unspecified foreign body in esophagus causing other injury, initial encounter: Secondary | ICD-10-CM | POA: Diagnosis not present

## 2023-10-31 DIAGNOSIS — E785 Hyperlipidemia, unspecified: Secondary | ICD-10-CM | POA: Diagnosis not present

## 2023-10-31 DIAGNOSIS — E669 Obesity, unspecified: Secondary | ICD-10-CM | POA: Insufficient documentation

## 2023-10-31 DIAGNOSIS — I4891 Unspecified atrial fibrillation: Secondary | ICD-10-CM | POA: Diagnosis not present

## 2023-10-31 DIAGNOSIS — T18120A Food in esophagus causing compression of trachea, initial encounter: Secondary | ICD-10-CM | POA: Diagnosis not present

## 2023-10-31 HISTORY — PX: ESOPHAGOGASTRODUODENOSCOPY: SHX5428

## 2023-10-31 HISTORY — PX: FOREIGN BODY REMOVAL: SHX962

## 2023-10-31 SURGERY — EGD (ESOPHAGOGASTRODUODENOSCOPY)
Anesthesia: General

## 2023-10-31 MED ORDER — PROPOFOL 10 MG/ML IV BOLUS
INTRAVENOUS | Status: DC | PRN
  Administered 2023-10-31: 150 mg via INTRAVENOUS
  Administered 2023-10-31: 50 mg via INTRAVENOUS

## 2023-10-31 MED ORDER — ONDANSETRON HCL 4 MG/2ML IJ SOLN
INTRAMUSCULAR | Status: DC | PRN
  Administered 2023-10-31: 4 mg via INTRAVENOUS

## 2023-10-31 MED ORDER — LIDOCAINE HCL (CARDIAC) PF 100 MG/5ML IV SOSY
PREFILLED_SYRINGE | INTRAVENOUS | Status: DC | PRN
  Administered 2023-10-31: 80 mg via INTRAVENOUS

## 2023-10-31 MED ORDER — DEXAMETHASONE SODIUM PHOSPHATE 4 MG/ML IJ SOLN
INTRAMUSCULAR | Status: DC | PRN
  Administered 2023-10-31: 8 mg via INTRAVENOUS

## 2023-10-31 MED ORDER — PROPOFOL 10 MG/ML IV BOLUS
INTRAVENOUS | Status: AC
  Filled 2023-10-31: qty 20

## 2023-10-31 MED ORDER — LACTATED RINGERS IV SOLN: INTRAVENOUS | Status: DC | PRN

## 2023-10-31 MED ORDER — PHENYLEPHRINE HCL (PRESSORS) 10 MG/ML IV SOLN
INTRAVENOUS | Status: DC | PRN
  Administered 2023-10-31: 100 ug via INTRAVENOUS
  Administered 2023-10-31: 160 ug via INTRAVENOUS
  Administered 2023-10-31 (×2): 80 ug via INTRAVENOUS
  Administered 2023-10-31 (×2): 160 ug via INTRAVENOUS
  Administered 2023-10-31: 80 ug via INTRAVENOUS

## 2023-10-31 MED ORDER — FENTANYL CITRATE (PF) 100 MCG/2ML IJ SOLN
INTRAMUSCULAR | Status: AC
  Filled 2023-10-31: qty 2

## 2023-10-31 MED ORDER — SODIUM CHLORIDE 0.9 % IV SOLN: INTRAVENOUS | Status: DC | PRN

## 2023-10-31 MED ORDER — FENTANYL CITRATE (PF) 100 MCG/2ML IJ SOLN
INTRAMUSCULAR | Status: DC | PRN
  Administered 2023-10-31 (×4): 12.5 ug via INTRAVENOUS
  Administered 2023-10-31: 25 ug via INTRAVENOUS
  Administered 2023-10-31 (×2): 12.5 ug via INTRAVENOUS

## 2023-10-31 MED ORDER — MIDAZOLAM HCL 2 MG/2ML IJ SOLN
INTRAMUSCULAR | Status: AC
  Filled 2023-10-31: qty 2

## 2023-10-31 MED ORDER — ESMOLOL HCL 100 MG/10ML IV SOLN
INTRAVENOUS | Status: DC | PRN
  Administered 2023-10-31 (×4): 10 mg via INTRAVENOUS
  Administered 2023-10-31: 15 mg via INTRAVENOUS

## 2023-10-31 MED ORDER — SUCCINYLCHOLINE CHLORIDE 200 MG/10ML IV SOSY
PREFILLED_SYRINGE | INTRAVENOUS | Status: DC | PRN
  Administered 2023-10-31: 120 mg via INTRAVENOUS

## 2023-10-31 NOTE — Transfer of Care (Signed)
Immediate Anesthesia Transfer of Care Note  Patient: Glen [name]  Procedure(s) Performed: ESOPHAGOGASTRODUODENOSCOPY (EGD) FOREIGN BODY REMOVAL Balloon dilation wire-guided  Patient Location: PACU  Anesthesia Type:General  Level of Consciousness: awake and alert   Airway & Oxygen Therapy: Patient Spontanous Breathing  Post-op Assessment: Report given to RN  Post vital signs: Reviewed  Last Vitals:  Vitals Value Taken Time  BP 133/79 10/31/23 1845  Temp 36.9 C 10/31/23 1841  Pulse 118 10/31/23 1845  Resp 17 10/31/23 1845  SpO2 92 % 10/31/23 1845  Vitals shown include unfiled device data.  Last Pain:  Vitals:   10/31/23 1845  TempSrc:   PainSc: 0-No pain         Complications: No notable events documented.

## 2023-10-31 NOTE — Anesthesia Procedure Notes (Signed)
Procedure Name: Intubation Date/Time: 10/31/2023 3:57 PM  Performed by: Floydene Flock, CRNAPre-anesthesia Checklist: Patient identified, Emergency Drugs available, Suction available and Patient being monitored Patient Re-evaluated:Patient Re-evaluated prior to induction Oxygen Delivery Method: Circle system utilized Preoxygenation: Pre-oxygenation with 100% oxygen Induction Type: IV induction Ventilation: Mask ventilation without difficulty Laryngoscope Size: Mac and 3 Grade View: Grade I Tube type: Oral Tube size: 7.5 mm Number of attempts: 1 Airway Equipment and Method: Stylet Placement Confirmation: ETT inserted through vocal cords under direct vision, positive ETCO2 and breath sounds checked- equal and bilateral Secured at: 23 cm Tube secured with: Tape Dental Injury: Teeth and Oropharynx as per pre-operative assessment

## 2023-10-31 NOTE — Discharge Instructions (Signed)
YOU HAD AN ENDOSCOPIC PROCEDURE TODAY: Refer to the procedure report and other information in the discharge instructions given to you for any specific questions about what was found during the examination. If this information does not answer your questions, please call [place] office at [phone] to clarify.   YOU SHOULD EXPECT: Some feelings of bloating in the abdomen. Passage of more gas than usual. Walking can help get rid of the air that was put into your GI tract during the procedure and reduce the bloating.  DIET: Your first meal following the procedure should be a light meal and then it is ok to progress to your normal diet. A half-sandwich or bowl of soup is an example of a good first meal. Heavy or fried foods are harder to digest and may make you feel nauseous or bloated. Drink plenty of fluids but you should avoid alcoholic beverages for 24 hours. If you had a esophageal dilation, please see attached instructions for diet.    ACTIVITY: Your care partner should take you home directly after the procedure. You should plan to take it easy, moving slowly for the rest of the day. You can resume normal activity the day after the procedure however YOU SHOULD NOT DRIVE, use power tools, machinery or perform tasks that involve climbing or major physical exertion for 24 hours (because of the sedation medicines used during the test).   SYMPTOMS TO REPORT IMMEDIATELY: A gastroenterologist can be reached at any hour. Please call [phone]  for any of the following symptoms:   Following upper endoscopy (EGD, EUS, ERCP, esophageal dilation) Vomiting of blood or coffee ground material  New, significant abdominal pain  New, significant chest pain or pain under the shoulder blades  Painful or persistently difficult swallowing  New shortness of breath  Black, tarry-looking or red, bloody stools  FOLLOW UP:  If any biopsies were taken you will be contacted by phone or by letter within the next 1-3  weeks. Call [phone]  if you have not heard about the biopsies in 3 weeks.  Please also call with any specific questions about appointments or follow up tests.

## 2023-10-31 NOTE — Telephone Encounter (Signed)
Inbound call from patient stating that he has been unable to keep anything down since yesterday and is requesting a call back to discuss. Please advise.

## 2023-10-31 NOTE — Progress Notes (Signed)
Chief Complaint: Nausea and vomiting  HPI:    Glen [name] is a 89 year old male with a past medical history as listed below including bleeding duodenal ulcer related to H. pylori, prostate cancer, esophageal dysphagia/dysmotility as well as A-fib, known to Dr. Rhea Belton, who was referred to me by Sharon Seller, NP for a complaint of nausea and vomiting.      01/17/2022 EGD at the hospital revealed food impaction, Schatzki's ring was found the distal esophagus dilated to 15 mm with a balloon.  Small hiatal hernia.  Scattered erythema in the stomach with biopsy negative for H. pylori and a mild duodenal stricture also biopsied and peptic in nature without dysplasia.    01/25/2022 barium esophagram after EGD with a 13 mm barium tablet which would not pass.    02/13/2022 office visit with Dr. Rhea Belton following up after hospitalization and EGD as above with continued issues with foods like steak and rice.  At time taking Omeprazole 40 mg once daily and occasionally twice daily.  At that visit recommended follow-up upper endoscopy.  Continued on Omeprazole 40 mg once to twice daily.  Also discussed alternating stools and continuing on Metamucil.    02/16/2022 EGD with tortuous esophagus, benign-appearing esophageal stenosis dilated to 17 mm, nodular mucosa at the GJ cardia site, small hiatal hernia, chronic gastritis and normal duodenum.  Biopsies negative for cancer.    09/06/2023 CBC normal.  CMP with a glucose of 101, total protein minimally decreased at 6.2 and total bili up at 1.2.    10/31/2023 patient's daughter called and described he was unable to keep any food down since last night.  Apparently ate some chicken legs last night for dinner could feel them coming back up, reported drinking some liquids and laying down he could feel the liquid coming back up in his throat.  Today able to drink some liquids and take some meds and that stayed down.    Today, the patient is to clinic accompanied by his  daughter.  Explains that he has had issues with his throat before last dilated as above in 2023.  Apparently had been doing okay but ate a chicken leg yesterday and felt like it got stuck in his throat yesterday evening.  He was unable to get it to clear and even took some drinks of water that came right back up afterwards.  He tried to chew some Tums and felt like maybe that helped a little bit.  He laid down to go to sleep but upon waking back up this morning he woke up and basically vomited more pieces of the chicken leg.  Tried to have some liquid soup around 11:30/12 and this came right back up.  The only thing he has been able to get down slightly is a little bit of ginger ale, but otherwise feels like something is still stuck in his throat.    Denies fever, chills or abdominal pain.  Past Medical History:  Diagnosis Date   Atrial fibrillation [facility])    Atrial fibrillation, chronic (HCC) 03/13/2008   Qualifier: Diagnosis of  By: Amador Cunas  MD, Janett Labella    Diverticulitis    Esophageal dysmotility    Esophageal dysphagia 07/29/2018   Family history of diabetes insipidus    FHx: colon cancer    H. pylori infection    Hiatal hernia    History of colonic polyps    Hyperlipidemia    Hypertension    IDA (iron deficiency anemia)  Obesity    Palpitation    Peptic ulcer disease    Presbyesophagus    Schatzki's ring     Past Surgical History:  Procedure Laterality Date   2-D Echocardiogram  November 2009, 2011   Anal Fistula Repair     BALLOON DILATION N/A 01/17/2022   Procedure: Marvis Repress DILATION;  Surgeon: Lynann Bologna, MD;  Location: [facility] ENDOSCOPY;  Service: Gastroenterology;  Laterality: N/A;   BIOPSY  07/14/2020   Procedure: BIOPSY;  Surgeon: Beverley Fiedler, MD;  Location: [facility] ENDOSCOPY;  Service: Gastroenterology;;   BIOPSY  01/17/2022   Procedure: BIOPSY;  Surgeon: Lynann Bologna, MD;  Location: [facility] ENDOSCOPY;  Service: Gastroenterology;;   CARDIAC VALVE SURGERY  1996   status post  balloon valvuloplasty at [facility]   CATARACT EXTRACTION  2004   COLONOSCOPY  December 2005   ESOPHAGOGASTRODUODENOSCOPY  07/14/2020   ESOPHAGOGASTRODUODENOSCOPY N/A 01/17/2022   Procedure: ESOPHAGOGASTRODUODENOSCOPY (EGD);  Surgeon: Lynann Bologna, MD;  Location: [facility] ENDOSCOPY;  Service: Gastroenterology;  Laterality: N/A;   ESOPHAGOGASTRODUODENOSCOPY (EGD) WITH PROPOFOL N/A 07/14/2020   Procedure: ESOPHAGOGASTRODUODENOSCOPY (EGD) WITH PROPOFOL;  Surgeon: Beverley Fiedler, MD;  Location: [facility] ENDOSCOPY;  Service: Gastroenterology;  Laterality: N/A;   ETT  1991   Negative   HEMOSTASIS CLIP PLACEMENT  07/14/2020   HEMOSTASIS CLIP PLACEMENT   HEMOSTASIS CLIP PLACEMENT  07/14/2020   Procedure: HEMOSTASIS CLIP PLACEMENT;  Surgeon: Beverley Fiedler, MD;  Location: [facility] ENDOSCOPY;  Service: Gastroenterology;;   KNEE ARTHROSCOPY     Right   TONSILLECTOMY      Current Outpatient Medications  Medication Sig Dispense Refill   acetaminophen (TYLENOL) 500 MG tablet Take 1,500 mg by mouth at bedtime. And 2 in the morning     Famotidine (ACID REDUCER PO) Take 1 tablet by mouth daily.     famotidine (PEPCID AC) 10 MG tablet Take 10 mg by mouth daily. (Patient not taking: Reported on 10/11/2023)     furosemide (LASIX) 20 MG tablet Take 1 tablet by mouth twice daily 180 tablet 0   loratadine (CLARITIN) 10 MG tablet Take 10 mg by mouth daily.     METAMUCIL FIBER PO Take 3 tablets by mouth in the morning and at bedtime.     metoprolol tartrate (LOPRESSOR) 100 MG tablet Take 1 tablet by mouth twice daily. 180 tablet 1   Multiple Vitamin (MULTIVITAMIN) tablet Take 1 tablet by mouth daily.     No current facility-administered medications for this visit.    Allergies as of 10/31/2023 - Review Complete 10/31/2023  Allergen Reaction Noted   Protonix [pantoprazole] Other (See Comments) 08/09/2020   Codeine Other (See Comments) 07/31/2007   Ferrous sulfate Itching and Swelling 09/14/2020    Family History  Problem  Relation Age of Onset   Cancer Mother    Heart Problems Father    Stroke Father    Heart attack Father    Breast cancer Sister    Stroke Brother    Heart Problems Brother    Esophageal cancer Neg Hx     Social History   Socioeconomic History   Marital status: Married    Spouse name: Not on file   Number of children: Not on file   Years of education: Not on file   Highest education level: Not on file  Occupational History   Occupation: Retired    Associate Professor: RETIRED  Tobacco Use   Smoking status: Never    Passive exposure: Past   Smokeless tobacco: Never  Vaping Use  Vaping status: Never Used  Substance and Sexual Activity   Alcohol use: Never   Drug use: Never   Sexual activity: Not on file  Other Topics Concern   Not on file  Social History Narrative   Regular exercise: Yes: [employer] 4 times/week   Social Drivers of Health   Financial Resource Strain: Low Risk  (12/26/2021)   Overall Financial Resource Strain (CARDIA)    Difficulty of Paying Living Expenses: Not hard at all  Food Insecurity: No Food Insecurity (10/23/2022)   Hunger Vital Sign    Worried About Running Out of Food in the Last Year: Never true    Ran Out of Food in the Last Year: Never true  Transportation Needs: No Transportation Needs (10/23/2022)   PRAPARE - Administrator, Civil Service (Medical): No    Lack of Transportation (Non-Medical): No  Physical Activity: Insufficiently Active (12/26/2021)   Exercise Vital Sign    Days of Exercise per Week: 5 days    Minutes of Exercise per Session: 10 min  Stress: No Stress Concern Present (12/26/2021)   [employer] of Occupational Health - Occupational Stress Questionnaire    Feeling of Stress : Not at all  Social Connections: Moderately Isolated (12/26/2021)   Social Connection and Isolation Panel [NHANES]    Frequency of Communication with Friends and Family: More than three times a week    Frequency of Social Gatherings with Friends and  Family: Once a week    Attends Religious Services: Never    Database administrator or Organizations: No    Attends Banker Meetings: Never    Marital Status: Married  Catering manager Violence: Not At Risk (12/26/2021)   Humiliation, Afraid, Rape, and Kick questionnaire    Fear of Current or Ex-Partner: No    Emotionally Abused: No    Physically Abused: No    Sexually Abused: No    Review of Systems:    Constitutional: No weight loss, fever or chills Skin: No rash  Cardiovascular: No chest pain Respiratory: No SOB  Gastrointestinal: See HPI and otherwise negative Genitourinary: No dysuria  Neurological: No headache, dizziness or syncope Musculoskeletal: No new muscle or joint pain Hematologic: No bleeding Psychiatric: No history of depression or anxiety   Physical Exam:  Vital signs: BP 112/86   Pulse (!) 42   Ht 5\' 11"  (1.803 m)   Wt 215 lb 8 oz (97.8 kg)   BMI 30.06 kg/m    Constitutional:   Pleasant elderly Caucasian male appears to be in NAD, Well developed, Well nourished, alert and cooperative Head:  Normocephalic and atraumatic. Eyes:   PEERL, EOMI. No icterus. Conjunctiva pink. Ears:  Normal auditory acuity. Neck:  Supple Throat: Oral cavity and pharynx without inflammation, swelling or lesion.  Respiratory: Respirations even and unlabored. Lungs clear to auscultation bilaterally.   No wheezes, crackles, or rhonchi.  Cardiovascular: Normal S1, S2. No MRG. Regular rate and rhythm. No peripheral edema, cyanosis or pallor.  Gastrointestinal:  Soft, nondistended, nontender. No rebound or guarding. Normal bowel sounds. No appreciable masses or hepatomegaly. Rectal:  Not performed.  Msk:  Symmetrical without gross deformities. Without edema, no deformity or joint abnormality.  Ambulates with walker Neurologic:  Alert and  oriented x4;  grossly normal neurologically.  Skin:   Dry and intact without significant lesions or rashes. Psychiatric:  Demonstrates  good judgement and reason without abnormal affect or behaviors.  RELEVANT LABS AND IMAGING: CBC    Component Value  Date/Time   WBC 8.0 09/06/2023 0940   RBC 4.56 09/06/2023 0940   HGB 15.1 09/06/2023 0940   HGB 16.3 10/04/2022 0944   HCT 43.7 09/06/2023 0940   HCT 47.3 10/04/2022 0944   PLT 183 09/06/2023 0940   PLT 209 10/04/2022 0944   MCV 95.8 09/06/2023 0940   MCV 96 10/04/2022 0944   MCH 33.1 09/06/2023 0940   MCHC 34.6 09/06/2023 0940   RDW 12.8 09/06/2023 0940   RDW 11.9 10/04/2022 0944   LYMPHSABS 1.4 09/06/2023 0940   LYMPHSABS CANCELED 07/11/2022 0933   MONOABS 0.6 09/06/2023 0940   EOSABS 0.1 09/06/2023 0940   EOSABS CANCELED 07/11/2022 0933   BASOSABS 0.1 09/06/2023 0940   BASOSABS CANCELED 07/11/2022 0933    CMP     Component Value Date/Time   NA 141 09/06/2023 0940   NA 143 10/04/2022 0944   K 3.7 09/06/2023 0940   CL 110 09/06/2023 0940   CO2 23 09/06/2023 0940   GLUCOSE 101 (H) 09/06/2023 0940   GLUCOSE 87 09/06/2006 1031   BUN 27 (H) 09/06/2023 0940   BUN 23 10/04/2022 0944   CREATININE 0.92 09/06/2023 0940   CREATININE 0.93 09/03/2023 1520   CALCIUM 8.6 (L) 09/06/2023 0940   PROT 6.2 (L) 09/06/2023 0940   PROT 6.0 07/11/2022 0933   ALBUMIN 3.6 09/06/2023 0940   ALBUMIN 4.0 07/11/2022 0933   AST 27 09/06/2023 0940   ALT 22 09/06/2023 0940   ALKPHOS 69 09/06/2023 0940   BILITOT 1.2 (H) 09/06/2023 0940   BILITOT 0.5 07/11/2022 0933   GFRNONAA >60 09/06/2023 0940   GFRAA 76 12/04/2019 1111    Assessment: 1.  Dysphagia/food impaction: History of Schatzki's ring last dilated in May 2023, patient ate a chicken leg yesterday and has has been unable to eat anything since, has had regurgitation of liquids that he has tried to drink including a clear soup for lunch around 1130/12; likely known Schatzki's ring and food impaction  Plan: 1.  Discussed case with her on-call PA Quentin Mulling at the hospital.  Told the patient to proceed to [facility]   He will need emergent EGD for food impaction.  Hopefully we can get him back into the ER and get him to the Endo lab shortly. 2.  Further recommendations pending EGD likely later this afternoon.  Hyacinth Meeker, PA-C [facility] 10/31/2023, 2:18 PM  Cc: Sharon Seller, NP

## 2023-10-31 NOTE — Patient Instructions (Signed)
Please proceed to [facility] emergency department.   _______________________________________________________  If your blood pressure at your visit was 140/90 or greater, please contact your primary care physician to follow up on this.  _______________________________________________________  If you are age 88 or older, your body mass index should be between 23-30. Your Body mass index is 30.06 kg/m. If this is out of the aforementioned range listed, please consider follow up with your Primary Care Provider.  If you are age 25 or younger, your body mass index should be between 19-25. Your Body mass index is 30.06 kg/m. If this is out of the aformentioned range listed, please consider follow up with your Primary Care Provider.   ________________________________________________________  The [facility] providers would like to encourage you to use [facility] to communicate with providers for non-urgent requests or questions.  Due to long hold times on the telephone, sending your provider a message by [facility] may be a faster and more efficient way to get a response.  Please allow 48 business hours for a response.  Please remember that this is for non-urgent requests.  _______________________________________________________

## 2023-10-31 NOTE — Anesthesia Preprocedure Evaluation (Signed)
Anesthesia Evaluation  Patient identified by MRN, date of birth, ID band Patient awake    Reviewed: Allergy & Precautions, NPO status , Patient's Chart, lab work & pertinent test results  Airway Mallampati: II  TM Distance: >3 FB Neck ROM: Full    Dental  (+) Dental Advisory Given   Pulmonary neg pulmonary ROS   breath sounds clear to auscultation       Cardiovascular hypertension, Pt. on medications + dysrhythmias Atrial Fibrillation  Rhythm:Regular Rate:Normal  '22 Echo: Overall normal LV function with EF of 55 to 60%. Subtle hypokinesis of the basal septum. Significant left atrial dilatation. Moderate aortic root dilatation at 47 mm with moderate dilation of ascending aorta at 45 mm.    Neuro/Psych negative neurological ROS     GI/Hepatic Neg liver ROS, hiatal hernia, PUD,,,  Endo/Other  negative endocrine ROS    Renal/GU negative Renal ROS     Musculoskeletal  (+) Arthritis ,    Abdominal   Peds  Hematology negative hematology ROS (+)   Anesthesia Other Findings   Reproductive/Obstetrics                             Anesthesia Physical Anesthesia Plan  ASA: 3  Anesthesia Plan: General   Post-op Pain Management:    Induction: Intravenous and Rapid sequence  PONV Risk Score and Plan: 2 and Dexamethasone and Ondansetron  Airway Management Planned: Oral ETT  Additional Equipment: None  Intra-op Plan:   Post-operative Plan: Extubation in OR  Informed Consent: I have reviewed the patients History and Physical, chart, labs and discussed the procedure including the risks, benefits and alternatives for the proposed anesthesia with the patient or authorized representative who has indicated his/her understanding and acceptance.     Dental advisory given  Plan Discussed with: CRNA  Anesthesia Plan Comments:        Anesthesia Quick Evaluation

## 2023-10-31 NOTE — Consult Note (Addendum)
Consultation  Referring Provider:   ER Primary Care Physician:  Sharon Seller, NP Primary Gastroenterologist:  Dr. Rhea Belton       Reason for Consultation:   Food impaction   DOA: 10/31/2023         Hospital Day: 1         HPI:   Glen [name] is a 89 y.o. male with past medical history significant for Afib not on anticoagulation, history of  bleeding duodenal ulcer related to H. pylori, prostate cancer, esophageal dysphagia/dysmotility with previous food impactions and dilations, last one 2023.   Presents to the ER from the office for food impaction. See Hyacinth Meeker, PA note for full evaluation.   Patient was seen in the office for nausea, vomiting after eating chicken leg 10/30/2023 patient states it felt seems familiar to previous food impactions. Patient last had something to drink 2+ hours ago with ginger ale was able to keep that down but patient tried soup for lunch and had regurgitation, patient ate a Tums to see if this would help and had regurgitation of the Tums. Denies fever, chills, abdominal pain.  Denies chest pain, shortness of breath. Daughter-in-law brought patient to the ER and is accompanying him.  Abnormal ED labs: Abnormal Labs Reviewed - No data to display  Past Medical History:  Diagnosis Date   Atrial fibrillation [facility])    Atrial fibrillation, chronic (HCC) 03/13/2008   Qualifier: Diagnosis of  By: Amador Cunas  MD, Janett Labella    Diverticulitis    Esophageal dysmotility    Esophageal dysphagia 07/29/2018   Family history of diabetes insipidus    FHx: colon cancer    H. pylori infection    Hiatal hernia    History of colonic polyps    Hyperlipidemia    Hypertension    IDA (iron deficiency anemia)    Obesity    Palpitation    Peptic ulcer disease    Presbyesophagus    Schatzki's ring     Surgical History:  He  has a past surgical history that includes Colonoscopy (December 2005); Anal Fistula Repair; Knee arthroscopy; Tonsillectomy;  ETT (1991); Cataract extraction (2004); Cardiac valuve replacement (1996); 2-D Echocardiogram (November 2009, 2011); Hemostasis clip placement (07/14/2020); Esophagogastroduodenoscopy (07/14/2020); Esophagogastroduodenoscopy (egd) with propofol (N/A, 07/14/2020); Hemostasis clip placement (07/14/2020); biopsy (07/14/2020); Esophagogastroduodenoscopy (N/A, 01/17/2022); biopsy (01/17/2022); and Balloon dilation (N/A, 01/17/2022). Family History:  His family history includes Breast cancer in his sister; Cancer in his mother; Heart Problems in his brother and father; Heart attack in his father; Stroke in his brother and father. Social History:   reports that he has never smoked. He has been exposed to tobacco smoke. He has never used smokeless tobacco. He reports that he does not drink alcohol and does not use drugs.  Prior to Admission medications   Medication Sig Start Date End Date Taking? Authorizing Provider  acetaminophen (TYLENOL) 500 MG tablet Take 1,500 mg by mouth at bedtime. And 2 in the morning    [provider]  Famotidine (ACID REDUCER PO) Take 1 mg by mouth daily.    [provider]  famotidine (PEPCID AC) 10 MG tablet Take 10 mg by mouth daily. Patient not taking: Reported on 10/31/2023    [provider]  furosemide (LASIX) 20 MG tablet Take 1 tablet by mouth twice daily 10/18/23   End, Cristal Deer, MD  loratadine (CLARITIN) 10 MG tablet Take 10 mg by mouth daily.    [provider]  METAMUCIL FIBER PO Take 3 tablets by mouth in the morning and at bedtime.    [provider]  metoprolol tartrate (LOPRESSOR) 100 MG tablet Take 1 tablet by mouth twice daily. 10/18/23   Jodelle Red, MD  Multiple Vitamin (MULTIVITAMIN) tablet Take 1 tablet by mouth daily.    [provider]    No current facility-administered medications for this encounter.    Allergies as of 10/31/2023 - Review Complete 10/31/2023  Allergen Reaction Noted    Protonix [pantoprazole] Other (See Comments) 08/09/2020   Codeine Other (See Comments) 07/31/2007   Ferrous sulfate Itching and Swelling 09/14/2020    Review of Systems:    Constitutional: No weight loss, fever, chills, weakness or fatigue HEENT: Eyes: No change in vision               Ears, Nose, Throat:  No change in hearing or congestion Skin: No rash or itching Cardiovascular: No chest pain, chest pressure or palpitations   Respiratory: No SOB or cough Gastrointestinal: See HPI and otherwise negative Genitourinary: No dysuria or change in urinary frequency Neurological: No headache, dizziness or syncope Musculoskeletal: No new muscle or joint pain Hematologic: No bleeding or bruising Psychiatric: No history of depression or anxiety     Physical Exam:  Vital signs in last 24 hours: Pulse Rate:  [42] 42 (01/29 1430) BP: (112)/(86) 112/86 (01/29 1430) Weight:  [97.8 kg] 97.8 kg (01/29 1430)   Last BM recorded by nurses in past 5 days No data recorded  General:   Pleasant, well developed male in no acute distress Head:  Normocephalic and atraumatic. Eyes: sclerae anicteric,conjunctive pink  Heart:  regular rate and rhythm, no murmurs or gallops Pulm: Clear anteriorly; no wheezing Abdomen:  Soft, Obese AB, Active bowel sounds. No tenderness Msk:  Symmetrical without gross deformities. Peripheral pulses intact.  Neurologic:  Alert and  oriented x4;  No focal deficits.  Ambulates with walker Skin:   Dry and intact without significant lesions or rashes. Psychiatric:  Cooperative. Normal mood and affect.  LAB RESULTS: No results for input(s): "WBC", "HGB", "HCT", "PLT" in the last 72 hours. BMET No results for input(s): "NA", "K", "CL", "CO2", "GLUCOSE", "BUN", "CREATININE", "CALCIUM" in the last 72 hours. LFT No results for input(s): "PROT", "ALBUMIN", "AST", "ALT", "ALKPHOS", "BILITOT", "BILIDIR", "IBILI" in the last 72 hours. PT/INR No results for input(s): "LABPROT",  "INR" in the last 72 hours.  STUDIES: No results found.    Impression /Plan   Food impaction with history of Schatzki ring last dilated May 2023 Similar to previous food impactions Not on anticoagulation No shortness of breath or chest pain Will plan on EGD for disimpaction  I discussed risks of EGD with patient today, including risk of sedation, bleeding or perforation.  Patient provides understanding and gave verbal consent to proceed. Further recommendations per Dr. Marina Goodell  Atrial fibrillation Not on anticoagulation 2022 echo EF 55 to 60% significant left atrial dilation moderate aortic root dilation 47 mm with moderate dilation of ascending aorta 45 mm surveillance  Active Problems:   * No active hospital problems. *    LOS: 0 days   Thank you for your kind consultation, we will continue to follow.   Doree Albee  10/31/2023, 3:29 PM  GI ATTENDING  History, laboratories, x-rays, prior upper endoscopy reports personally reviewed.  Patient seen and examined personally in the endoscopy suite.  He presents with acute food impaction (chicken consumed yesterday).  He has a history  of the same.  Now for urgent upper endoscopy with possible esophageal dilation.  Patient is high risk given his age and comorbidities.The nature of the procedure, as well as the risks, benefits, and alternatives were carefully and thoroughly reviewed with the patient. Ample time for discussion and questions allowed. The patient understood, was satisfied, and agreed to proceed.  Wilhemina Bonito. Eda Keys., M.D. [facility] Division of Gastroenterology

## 2023-10-31 NOTE — Telephone Encounter (Signed)
Patient daughter requesting f/u call to discuss previous note. Please advise.   Thank you

## 2023-10-31 NOTE — ED Notes (Signed)
Pt to endo by GI PA

## 2023-10-31 NOTE — Anesthesia Postprocedure Evaluation (Signed)
Anesthesia Post Note  Patient: Glen [name]  Procedure(s) Performed: ESOPHAGOGASTRODUODENOSCOPY (EGD) FOREIGN BODY REMOVAL Balloon dilation wire-guided     Patient location during evaluation: PACU Anesthesia Type: General Level of consciousness: awake and alert Pain management: pain level controlled Vital Signs Assessment: post-procedure vital signs reviewed and stable Respiratory status: spontaneous breathing, nonlabored ventilation, respiratory function stable and patient connected to nasal cannula oxygen Cardiovascular status: blood pressure returned to baseline and stable Postop Assessment: no apparent nausea or vomiting Anesthetic complications: no  No notable events documented.  Last Vitals:  Vitals:   10/31/23 1841 10/31/23 1845  BP:  133/79  Pulse:  (!) 120  Resp:  17  Temp: 36.9 C   SpO2:  92%    Last Pain:  Vitals:   10/31/23 1845  TempSrc:   PainSc: 0-No pain                 Kennieth Rad

## 2023-10-31 NOTE — ED Provider Notes (Signed)
[facility] EMERGENCY DEPARTMENT AT [facility] Provider Note  CSN: [id number] Arrival date & time: 10/31/23 1505  Chief Complaint(s) No chief complaint on file.  HPI Glen [name] is a 89 y.o. male who presents emergency room for evaluation of an esophageal food bolus.  Patient arrives from GI clinic sent directly to the ER to go straight to endoscopy.  Endorsing a sensation of fullness in the chest, not actively vomiting in the waiting room.   Past Medical History Past Medical History:  Diagnosis Date   Atrial fibrillation [facility])    Atrial fibrillation, chronic (HCC) 03/13/2008   Qualifier: Diagnosis of  By: Amador Cunas  MD, Janett Labella    Diverticulitis    Esophageal dysmotility    Esophageal dysphagia 07/29/2018   Family history of diabetes insipidus    FHx: colon cancer    H. pylori infection    Hiatal hernia    History of colonic polyps    Hyperlipidemia    Hypertension    IDA (iron deficiency anemia)    Obesity    Palpitation    Peptic ulcer disease    Presbyesophagus    Schatzki's ring    Patient Active Problem List   Diagnosis Date Noted   History of GI bleed 10/04/2022   Permanent atrial fibrillation (HCC) 10/04/2022   Physical deconditioning 01/16/2022   Dehydration 01/16/2022   Peptic ulcer disease    Anemia, unspecified 07/16/2020   Fall 07/13/2020   Osteoarthritis 06/25/2019   Onychomycosis 07/29/2018   Peripheral edema 11/18/2015   Impaired glucose tolerance 04/19/2015   Chronic atrial fibrillation (HCC) 11/07/2010   Essential hypertension 01/20/2009   Dyslipidemia 07/31/2007   History of colonic polyps 07/31/2007   Home Medication(s) Prior to Admission medications   Medication Sig Start Date End Date Taking? Authorizing Provider  acetaminophen (TYLENOL) 500 MG tablet Take 1,500 mg by mouth at bedtime. And 2 in the morning    [provider]  Famotidine (ACID REDUCER PO) Take 1 mg by mouth daily.    [provider]   famotidine (PEPCID AC) 10 MG tablet Take 10 mg by mouth daily. Patient not taking: Reported on 10/31/2023    [provider]  furosemide (LASIX) 20 MG tablet Take 1 tablet by mouth twice daily 10/18/23   End, Cristal Deer, MD  loratadine (CLARITIN) 10 MG tablet Take 10 mg by mouth daily.    [provider]  METAMUCIL FIBER PO Take 3 tablets by mouth in the morning and at bedtime.    [provider]  metoprolol tartrate (LOPRESSOR) 100 MG tablet Take 1 tablet by mouth twice daily. 10/18/23   Jodelle Red, MD  Multiple Vitamin (MULTIVITAMIN) tablet Take 1 tablet by mouth daily.    [provider]  Past Surgical History Past Surgical History:  Procedure Laterality Date   2-D Echocardiogram  November 2009, 2011   Anal Fistula Repair     BALLOON DILATION N/A 01/17/2022   Procedure: Marvis Repress DILATION;  Surgeon: Lynann Bologna, MD;  Location: [facility] ENDOSCOPY;  Service: Gastroenterology;  Laterality: N/A;   BIOPSY  07/14/2020   Procedure: BIOPSY;  Surgeon: Beverley Fiedler, MD;  Location: [facility] ENDOSCOPY;  Service: Gastroenterology;;   BIOPSY  01/17/2022   Procedure: BIOPSY;  Surgeon: Lynann Bologna, MD;  Location: [facility] ENDOSCOPY;  Service: Gastroenterology;;   CARDIAC VALVE SURGERY  1996   status post balloon valvuloplasty at [facility]   CATARACT EXTRACTION  2004   COLONOSCOPY  December 2005   ESOPHAGOGASTRODUODENOSCOPY  07/14/2020   ESOPHAGOGASTRODUODENOSCOPY N/A 01/17/2022   Procedure: ESOPHAGOGASTRODUODENOSCOPY (EGD);  Surgeon: Lynann Bologna, MD;  Location: [facility] ENDOSCOPY;  Service: Gastroenterology;  Laterality: N/A;   ESOPHAGOGASTRODUODENOSCOPY (EGD) WITH PROPOFOL N/A 07/14/2020   Procedure: ESOPHAGOGASTRODUODENOSCOPY (EGD) WITH PROPOFOL;  Surgeon: Beverley Fiedler, MD;  Location: [facility] ENDOSCOPY;  Service: Gastroenterology;  Laterality: N/A;   ETT   1991   Negative   HEMOSTASIS CLIP PLACEMENT  07/14/2020   HEMOSTASIS CLIP PLACEMENT   HEMOSTASIS CLIP PLACEMENT  07/14/2020   Procedure: HEMOSTASIS CLIP PLACEMENT;  Surgeon: Beverley Fiedler, MD;  Location: [facility] ENDOSCOPY;  Service: Gastroenterology;;   KNEE ARTHROSCOPY     Right   TONSILLECTOMY     Family History Family History  Problem Relation Age of Onset   Cancer Mother    Heart Problems Father    Stroke Father    Heart attack Father    Breast cancer Sister    Stroke Brother    Heart Problems Brother    Esophageal cancer Neg Hx     Social History Social History   Tobacco Use   Smoking status: Never    Passive exposure: Past   Smokeless tobacco: Never  Vaping Use   Vaping status: Never Used  Substance Use Topics   Alcohol use: Never   Drug use: Never   Allergies Protonix [pantoprazole], Codeine, and Ferrous sulfate  Review of Systems Review of Systems  All other systems reviewed and are negative.   Physical Exam Vital Signs  I have reviewed the triage vital signs There were no vitals taken for this visit.  Physical Exam Vitals and nursing note reviewed.  Constitutional:      General: He is not in acute distress.    Appearance: He is well-developed.  HENT:     Head: Normocephalic and atraumatic.  Eyes:     Conjunctiva/sclera: Conjunctivae normal.  Cardiovascular:     Rate and Rhythm: Normal rate and regular rhythm.     Heart sounds: No murmur heard. Pulmonary:     Effort: Pulmonary effort is normal. No respiratory distress.  Musculoskeletal:        General: No swelling.     Cervical back: Neck supple.  Skin:    General: Skin is warm and dry.  Neurological:     Mental Status: He is alert.  Psychiatric:        Mood and Affect: Mood normal.     ED Results and Treatments Labs (all labs ordered are listed, but only abnormal results are displayed) Labs Reviewed - No data to display  Radiology No results found.  Pertinent labs & imaging results that were available during my care of the patient were reviewed by me and considered in my medical decision making (see MDM for details).  Medications Ordered in ED Medications - No data to display                                                                                                                                   Procedures Procedures  (including critical care time)  Medical Decision Making / ED Course   This patient presents to the ED for concern of esophageal food impaction this involves an extensive number of treatment options, and is a complaint that carries with it a high risk of complications and morbidity.  The differential diagnosis includes food impaction, Boerhaave's, globus, malignancy  MDM: Patient seen emergency room for evaluation of esophageal food bolus.  Gastroenterology met the patient in the waiting room and took the patient straight to the endoscopy suite.  No additional ER workup was performed as patient needs definitive treatment and this would delay care.   Additional history obtained: -Additional history obtained from wife -External records from outside source obtained and reviewed including: Chart review including previous notes, labs, imaging, consultation notes   Social Determinants of Health:  Factors impacting patients care include: none   Reevaluation: After the interventions noted above, I reevaluated the patient and found that they have :stayed the same  Co morbidities that complicate the patient evaluation  Past Medical History:  Diagnosis Date   Atrial fibrillation [facility])    Atrial fibrillation, chronic (HCC) 03/13/2008   Qualifier: Diagnosis of  By: Amador Cunas  MD, Janett Labella    Diverticulitis    Esophageal dysmotility    Esophageal dysphagia 07/29/2018   Family history of diabetes insipidus    FHx: colon cancer     H. pylori infection    Hiatal hernia    History of colonic polyps    Hyperlipidemia    Hypertension    IDA (iron deficiency anemia)    Obesity    Palpitation    Peptic ulcer disease    Presbyesophagus    Schatzki's ring       Dispostion: I considered admission for this patient, and patient was taken straight to the endoscopy suite     Final Clinical Impression(s) / ED Diagnoses Final diagnoses:  None     @PCDICTATION @    Glendora Score, MD 10/31/23 1527

## 2023-10-31 NOTE — Telephone Encounter (Signed)
Spoke with pt and he states he at some chicken legs last night and could feel them coming back up. Reports drinking some liquids and laying down and he could feel the liquid coming back up in his throat. Today he states he was able to drink some liquids and take some meds and that stayed down. Pt scheduled to see Hyacinth Meeker PA today at 2:30pm. Pt will call back if he cannot get a ride to the appt.

## 2023-11-01 ENCOUNTER — Encounter (HOSPITAL_COMMUNITY): Payer: Self-pay | Admitting: Internal Medicine

## 2023-11-01 NOTE — Op Note (Addendum)
[facility] Patient Name: Glen [name] Procedure Date: 10/31/2023 MRN: [id number] Attending MD: Wilhemina Bonito. Marina Goodell , MD, [id number] Date of Birth: 08/09/1928 CSN: [id number] Age: 89 Admit Type: Outpatient Procedure:                Upper GI endoscopy with dilation of the esophagus                            and removal of food impaction Indications:              Dysphagia, Foreign body in the esophagus Providers:                Wilhemina Bonito. Marina Goodell, MD, Jacquelyn "Jaci" Clelia Croft, RN, Alan Ripper, Technician Referring MD:             Windell Hummingbird, MD Medicines:                General anesthesia Complications:            No immediate complications. Estimated Blood Loss:     Estimated blood loss: none. Procedure:                Pre-Anesthesia Assessment:                           - Prior to the procedure, a History and Physical                            was performed, and patient medications and                            allergies were reviewed. The patient's tolerance of                            previous anesthesia was also reviewed. The risks                            and benefits of the procedure and the sedation                            options and risks were discussed with the patient.                            All questions were answered, and informed consent                            was obtained. Prior Anticoagulants: The patient has                            taken no anticoagulant or antiplatelet agents. ASA                            Grade Assessment: II - A patient with mild systemic  disease. After reviewing the risks and benefits,                            the patient was deemed in satisfactory condition to                            undergo the procedure.                           After obtaining informed consent, the endoscope was                            passed under direct vision. Throughout the                             procedure, the patient's blood pressure, pulse, and                            oxygen saturations were monitored continuously. The                            [id number]-[id number] ([id number]) Olympus endoscope was introduced                            through the mouth, and advanced to the second part                            of duodenum. The upper GI endoscopy was                            accomplished with difficulty due to degree of food                            impaction. The patient tolerated the procedure well. Scope In: Scope Out: Findings:      The esophagus was was impacted with MASSIVE amounts of meat and       vegetative material throughout the entirety. There was a mid esophageal       stricture that was estimated at about 12 mm. Multiple techniques were       used to relieve the food impaction including retrieval net, suction, and       rat-tooth forcep. Eventually, the scope made its way to the       gastroesophageal junction where there was a 14 mm stricture. This was       dilated with a balloon to about 16.5 mm to allow passage of impacted       food into the stomach. Large volumes of food were moved into the       stomach. In the more proximal esophagus food needed to be removed       retrograde. This was complicated by the presence of the mid esophageal       stricture. Eventually complete relief was achieved. There was macerated       esophageal mucosa, but no deep defects. The mouth was cleared of all       visible debris as well as the esophagus. It  took over 90 minutes to       complete the procedure.      The stomach revealed a hiatal hernia but there were no other gross       abnormalities.      The examined duodenum was normal.      The cardia and gastric fundus were normal on retroflexion. Impression:               1. Complex esophageal food impaction as described                           2. Mid esophageal stricture measuring 12 mm. Not                             dilated                           3. Distal esophageal stricture measuring 14 mm.                            Dilated                           4. Small to moderate hiatal hernia. Otherwise                            normal EGD. Moderate Sedation:      none Recommendation:           1. Patient has a contact number available for                            emergencies. The signs and symptoms of potential                            delayed complications were discussed with the                            patient. Return to normal activities tomorrow.                            Written discharge instructions were provided to the                            patient.                           2. Clear liquids only for 24 hours. Thereafter,                            soft foods only. NO MEAT                           3. Continue present medications.                           4. Our office will contact you to set up upper  endoscopy with esophageal dilation of mid and                            distal esophageal strictures.                           Discussed with patient's daughter-in-law. A copy of                            this report provided Procedure Code(s):        --- Professional ---                           [phone], Esophagogastroduodenoscopy, flexible,                            transoral; diagnostic, including collection of                            specimen(s) by brushing or washing, when performed                            (separate procedure) Diagnosis Code(s):        --- Professional ---                           R13.10, Dysphagia, unspecified                           T18.108A, Unspecified foreign body in esophagus                            causing other injury, initial encounter CPT copyright 2022 [employer]. All rights reserved. The codes documented in this report are preliminary and upon coder review may  be revised to  meet current compliance requirements. Wilhemina Bonito. Marina Goodell, MD 10/31/2023 5:57:25 PM This report has been signed electronically. Number of Addenda: 0

## 2023-11-02 ENCOUNTER — Telehealth: Payer: Self-pay

## 2023-11-02 ENCOUNTER — Other Ambulatory Visit: Payer: Self-pay

## 2023-11-02 DIAGNOSIS — K222 Esophageal obstruction: Secondary | ICD-10-CM

## 2023-11-02 NOTE — Telephone Encounter (Signed)
Left message with pts daughter, trying to schedule pt for EGD on 11/07/23. She is checking on transportation and will call back. Other date would be 12/25/23.  Pts daughter called back and pt is scheduled for EGD on 11/07/23 at 11am. She knows pt is to be NPO after midnight and to arrive at 10am. Amb ref in epic.

## 2023-11-02 NOTE — Telephone Encounter (Signed)
-----   Message from Carie Caddy Pyrtle sent at 11/01/2023  9:16 AM EST ----- Regarding: RE: Needs follow-up EGD with dilation Thanks JP and sorry for the business.  90 min, wow. Your report did not make it to EPIC, so hospital endo needs to check on that. Bonita Quin, he can be scheduled for EGD with me in [facility] for dilation JMP ----- Message ----- From: Hilarie Fredrickson, MD Sent: 10/31/2023   6:04 PM EST To: Chrystie Nose, RN; Beverley Fiedler, MD Subject: Needs follow-up EGD with dilation              Glen [name],  [place] EGD with food impaction (90 minutes).  I have never seen so much chicken in my life (not even at the [facility]). He has 2 strictures, 1 high-grade in the mid esophagus and 1 moderate in the distal esophagus.  I dilated the distal stricture to help with some of the impaction relief. He needs follow-up EGD with dilation of both strictures.  You have done him before. He will expect a call from our office (I spoke to his daughter-in-law tonight).  JP

## 2023-11-05 ENCOUNTER — Encounter (HOSPITAL_BASED_OUTPATIENT_CLINIC_OR_DEPARTMENT_OTHER): Payer: Medicare Other

## 2023-11-07 ENCOUNTER — Ambulatory Visit (AMBULATORY_SURGERY_CENTER): Payer: Medicare Other | Admitting: Internal Medicine

## 2023-11-07 ENCOUNTER — Encounter: Payer: Self-pay | Admitting: Internal Medicine

## 2023-11-07 VITALS — BP 115/77 | HR 92 | Temp 98.2°F | Resp 22 | Ht 71.0 in | Wt 215.0 lb

## 2023-11-07 DIAGNOSIS — I10 Essential (primary) hypertension: Secondary | ICD-10-CM | POA: Diagnosis not present

## 2023-11-07 DIAGNOSIS — E785 Hyperlipidemia, unspecified: Secondary | ICD-10-CM | POA: Diagnosis not present

## 2023-11-07 DIAGNOSIS — K222 Esophageal obstruction: Secondary | ICD-10-CM | POA: Diagnosis not present

## 2023-11-07 DIAGNOSIS — E669 Obesity, unspecified: Secondary | ICD-10-CM | POA: Diagnosis not present

## 2023-11-07 DIAGNOSIS — I4891 Unspecified atrial fibrillation: Secondary | ICD-10-CM | POA: Diagnosis not present

## 2023-11-07 DIAGNOSIS — K449 Diaphragmatic hernia without obstruction or gangrene: Secondary | ICD-10-CM | POA: Diagnosis not present

## 2023-11-07 MED ORDER — SODIUM CHLORIDE 0.9 % IV SOLN: 500.0000 mL | Freq: Once | INTRAVENOUS | Status: DC

## 2023-11-07 MED ORDER — OMEPRAZOLE 40 MG PO CPDR: 40.0000 mg | DELAYED_RELEASE_CAPSULE | Freq: Every day | ORAL | 3 refills | Status: DC

## 2023-11-07 NOTE — Progress Notes (Signed)
 Pt's states no medical or surgical changes since previsit or office visit.

## 2023-11-07 NOTE — Progress Notes (Signed)
 GASTROENTEROLOGY PROCEDURE H&P NOTE   Primary Care Physician: Caro Harlene POUR, NP    Reason for Procedure:  Recent food impaction with esophageal stricture  Plan:    EGD with further dilation  Patient is appropriate for endoscopic procedure(s) in the ambulatory ([facility]) setting.  The nature of the procedure, as well as the risks, benefits, and alternatives were carefully and thoroughly reviewed with the patient. Ample time for discussion and questions allowed. The patient understood, was satisfied, and agreed to proceed.     HPI: Glen [name] is a 89 y.o. male who presents for EGD.  Medical history as below.   No recent chest pain or shortness of breath.  No abdominal pain today.  Past Medical History:  Diagnosis Date   Atrial fibrillation [facility])    Atrial fibrillation, chronic (HCC) 03/13/2008   Qualifier: Diagnosis of  By: Jame  MD, Maude FALCON    Diverticulitis    Esophageal dysmotility    Esophageal dysphagia 07/29/2018   Family history of diabetes insipidus    FHx: colon cancer    H. pylori infection    Hiatal hernia    History of colonic polyps    Hyperlipidemia    Hypertension    IDA (iron deficiency anemia)    Obesity    Palpitation    Peptic ulcer disease    Presbyesophagus    Schatzki's ring     Past Surgical History:  Procedure Laterality Date   2-D Echocardiogram  November 2009, 2011   Anal Fistula Repair     BALLOON DILATION N/A 01/17/2022   Procedure: MERRILL HODGKIN;  Surgeon: Charlanne Groom, MD;  Location: [facility];  Service: Gastroenterology;  Laterality: N/A;   BIOPSY  07/14/2020   Procedure: BIOPSY;  Surgeon: Albertus Gordy HERO, MD;  Location: [facility];  Service: Gastroenterology;;   BIOPSY  01/17/2022   Procedure: BIOPSY;  Surgeon: Charlanne Groom, MD;  Location: [facility];  Service: Gastroenterology;;   CARDIAC VALVE SURGERY  1996   status post balloon valvuloplasty at [facility]   CATARACT EXTRACTION  2004   COLONOSCOPY  December  2005   ESOPHAGOGASTRODUODENOSCOPY  07/14/2020   ESOPHAGOGASTRODUODENOSCOPY N/A 01/17/2022   Procedure: ESOPHAGOGASTRODUODENOSCOPY (EGD);  Surgeon: Charlanne Groom, MD;  Location: [facility];  Service: Gastroenterology;  Laterality: N/A;   ESOPHAGOGASTRODUODENOSCOPY N/A 10/31/2023   Procedure: ESOPHAGOGASTRODUODENOSCOPY (EGD);  Surgeon: Abran Norleen SAILOR, MD;  Location: [facility];  Service: Gastroenterology;  Laterality: N/A;   ESOPHAGOGASTRODUODENOSCOPY (EGD) WITH PROPOFOL  N/A 07/14/2020   Procedure: ESOPHAGOGASTRODUODENOSCOPY (EGD) WITH PROPOFOL ;  Surgeon: Albertus Gordy HERO, MD;  Location: [facility]COPY;  Service: Gastroenterology;  Laterality: N/A;   ETT  1991   Negative   FOREIGN BODY REMOVAL  10/31/2023   Procedure: FOREIGN BODY REMOVAL;  Surgeon: Abran Norleen SAILOR, MD;  Location: [facility];  Service: Gastroenterology;;   HEMOSTASIS CLIP PLACEMENT  07/14/2020   HEMOSTASIS CLIP PLACEMENT   HEMOSTASIS CLIP PLACEMENT  07/14/2020   Procedure: HEMOSTASIS CLIP PLACEMENT;  Surgeon: Albertus Gordy HERO, MD;  Location: [facility];  Service: Gastroenterology;;   KNEE ARTHROSCOPY     Right   TONSILLECTOMY      Prior to Admission medications   Medication Sig Start Date End Date Taking? Authorizing Provider  acetaminophen  (TYLENOL ) 500 MG tablet Take 1,500 mg by mouth at bedtime. And 2 in the morning   Yes [provider]  Famotidine  (ACID REDUCER PO) Take 1 mg by mouth daily.   Yes [provider]  furosemide  (LASIX ) 20 MG tablet  Take 1 tablet by mouth twice daily 10/18/23  Yes End, Lonni, MD  loratadine (CLARITIN) 10 MG tablet Take 10 mg by mouth daily.   Yes [provider]  METAMUCIL FIBER PO Take 3 tablets by mouth in the morning and at bedtime.   Yes [provider]  metoprolol  tartrate (LOPRESSOR ) 100 MG tablet Take 1 tablet by mouth twice daily. 10/18/23  Yes Lonni Slain, MD  Multiple Vitamin (MULTIVITAMIN) tablet Take 1 tablet by mouth daily.   Yes  [provider]  famotidine  (PEPCID  AC) 10 MG tablet Take 10 mg by mouth daily. Patient not taking: Reported on 10/31/2023    [provider]    Current Outpatient Medications  Medication Sig Dispense Refill   acetaminophen  (TYLENOL ) 500 MG tablet Take 1,500 mg by mouth at bedtime. And 2 in the morning     Famotidine  (ACID REDUCER PO) Take 1 mg by mouth daily.     furosemide  (LASIX ) 20 MG tablet Take 1 tablet by mouth twice daily 180 tablet 0   loratadine (CLARITIN) 10 MG tablet Take 10 mg by mouth daily.     METAMUCIL FIBER PO Take 3 tablets by mouth in the morning and at bedtime.     metoprolol  tartrate (LOPRESSOR ) 100 MG tablet Take 1 tablet by mouth twice daily. 180 tablet 1   Multiple Vitamin (MULTIVITAMIN) tablet Take 1 tablet by mouth daily.     famotidine  (PEPCID  AC) 10 MG tablet Take 10 mg by mouth daily. (Patient not taking: Reported on 10/31/2023)     Current Facility-Administered Medications  Medication Dose Route Frequency Provider Last Rate Last Admin   0.9 %  sodium chloride  infusion  500 mL Intravenous Once Shateka Petrea, Gordy HERO, MD        Allergies as of 11/07/2023 - Review Complete 11/07/2023  Allergen Reaction Noted   Protonix  [pantoprazole ] Other (See Comments) 08/09/2020   Codeine Other (See Comments) 07/31/2007   Ferrous sulfate  Itching and Swelling 09/14/2020    Family History  Problem Relation Age of Onset   Cancer Mother    Heart Problems Father    Stroke Father    Heart attack Father    Breast cancer Sister    Stroke Brother    Heart Problems Brother    Esophageal cancer Neg Hx    Colon cancer Neg Hx    Stomach cancer Neg Hx    Rectal cancer Neg Hx     Social History   Socioeconomic History   Marital status: Married    Spouse name: Not on file   Number of children: Not on file   Years of education: Not on file   Highest education level: Not on file  Occupational History   Occupation: Retired    Associate Professor: RETIRED  Tobacco Use    Smoking status: Never    Passive exposure: Past   Smokeless tobacco: Never  Vaping Use   Vaping status: Never Used  Substance and Sexual Activity   Alcohol use: Never   Drug use: Never   Sexual activity: Not on file  Other Topics Concern   Not on file  Social History Narrative   Regular exercise: Yes: [employer] 4 times/week   Social Drivers of Health   Financial Resource Strain: Low Risk  (12/26/2021)   Overall Financial Resource Strain (CARDIA)    Difficulty of Paying Living Expenses: Not hard at all  Food Insecurity: No Food Insecurity (10/23/2022)   Hunger Vital Sign    Worried About Running Out  of Food in the Last Year: Never true    Ran Out of Food in the Last Year: Never true  Transportation Needs: No Transportation Needs (10/23/2022)   PRAPARE - Administrator, Civil Service (Medical): No    Lack of Transportation (Non-Medical): No  Physical Activity: Insufficiently Active (12/26/2021)   Exercise Vital Sign    Days of Exercise per Week: 5 days    Minutes of Exercise per Session: 10 min  Stress: No Stress Concern Present (12/26/2021)   [employer] of Occupational Health - Occupational Stress Questionnaire    Feeling of Stress : Not at all  Social Connections: Moderately Isolated (12/26/2021)   Social Connection and Isolation Panel [NHANES]    Frequency of Communication with Friends and Family: More than three times a week    Frequency of Social Gatherings with Friends and Family: Once a week    Attends Religious Services: Never    Database Administrator or Organizations: No    Attends Banker Meetings: Never    Marital Status: Married  Catering Manager Violence: Not At Risk (12/26/2021)   Humiliation, Afraid, Rape, and Kick questionnaire    Fear of Current or Ex-Partner: No    Emotionally Abused: No    Physically Abused: No    Sexually Abused: No    Physical Exam: Vital signs in last 24 hours: @BP  132/67   Pulse (!) 131   Temp 98.2 F  (36.8 C) (Temporal)   Ht 5' 11 (1.803 m)   Wt 215 lb (97.5 kg)   SpO2 99%   BMI 29.99 kg/m  GEN: NAD EYE: Sclerae anicteric ENT: MMM CV: Non-tachycardic Pulm: CTA b/l GI: Soft, NT/ND NEURO:  Alert & Oriented x 3   Gordy Starch, MD  Gastroenterology  11/07/2023 10:53 AM

## 2023-11-07 NOTE — Progress Notes (Signed)
 Called to room to assist during endoscopic procedure.  Patient ID and intended procedure confirmed with present staff. Received instructions for my participation in the procedure from the performing physician.

## 2023-11-07 NOTE — Patient Instructions (Addendum)
 Resume previous diet - soft diet education attached.  Education attached on hiatal hernia.  Continue previous medications - start Omeprazole  40mg  daily.  Repeat upper endoscopy as needed.    YOU HAD AN ENDOSCOPIC PROCEDURE TODAY AT THE [facility]:   Refer to the procedure report that was given to you for any specific questions about what was found during the examination.  If the procedure report does not answer your questions, please call your gastroenterologist to clarify.  If you requested that your care partner not be given the details of your procedure findings, then the procedure report has been included in a sealed envelope for you to review at your convenience later.  YOU SHOULD EXPECT: Some feelings of bloating in the abdomen. Passage of more gas than usual.  Walking can help get rid of the air that was put into your GI tract during the procedure and reduce the bloating. If you had a lower endoscopy (such as a colonoscopy or flexible sigmoidoscopy) you may notice spotting of blood in your stool or on the toilet paper. If you underwent a bowel prep for your procedure, you may not have a normal bowel movement for a few days.  Please Note:  You might notice some irritation and congestion in your nose or some drainage.  This is from the oxygen used during your procedure.  There is no need for concern and it should clear up in a day or so.  SYMPTOMS TO REPORT IMMEDIATELY:  Following upper endoscopy (EGD)  Vomiting of blood or coffee ground material  New chest pain or pain under the shoulder blades  Painful or persistently difficult swallowing  New shortness of breath  Fever of 100F or higher  Black, tarry-looking stools  For urgent or emergent issues, a gastroenterologist can be reached at any hour by calling [phone]. Do not use MyChart messaging for urgent concerns.    DIET:  We do recommend a small meal at first, but then you may proceed to your regular diet.  Drink  plenty of fluids but you should avoid alcoholic beverages for 24 hours.  ACTIVITY:  You should plan to take it easy for the rest of today and you should NOT DRIVE or use heavy machinery until tomorrow (because of the sedation medicines used during the test).    FOLLOW UP: Our staff will call the number listed on your records the next business day following your procedure.  We will call around 7:15- 8:00 am to check on you and address any questions or concerns that you may have regarding the information given to you following your procedure. If we do not reach you, we will leave a message.     If any biopsies were taken you will be contacted by phone or by letter within the next 1-3 weeks.  Please call us  at [phone] if you have not heard about the biopsies in 3 weeks.    SIGNATURES/CONFIDENTIALITY: You and/or your care partner have signed paperwork which will be entered into your electronic medical record.  These signatures attest to the fact that that the information above on your After Visit Summary has been reviewed and is understood.  Full responsibility of the confidentiality of this discharge information lies with you and/or your care-partner.

## 2023-11-07 NOTE — Progress Notes (Signed)
 Report to PACU, RN, vss, BBS= Clear.

## 2023-11-07 NOTE — Op Note (Signed)
 [facility] Patient Name: Glen [name] Procedure Date: 11/07/2023 10:50 AM MRN: [id number] Endoscopist: Gordy CHRISTELLA Starch , MD, [id number] Age: 89 Referring MD:  Date of Birth: March 20, 1928 Gender: Male Account #: [id number] Procedure:                Upper GI endoscopy Indications:              For therapy of esophageal stenosis seen at recent                            EGD for food impaction (10/31/2023) Medicines:                Monitored Anesthesia Care Procedure:                Pre-Anesthesia Assessment:                           - Prior to the procedure, a History and Physical                            was performed, and patient medications and                            allergies were reviewed. The patient's tolerance of                            previous anesthesia was also reviewed. The risks                            and benefits of the procedure and the sedation                            options and risks were discussed with the patient.                            All questions were answered, and informed consent                            was obtained. Prior Anticoagulants: The patient has                            taken no anticoagulant or antiplatelet agents. ASA                            Grade Assessment: III - A patient with severe                            systemic disease. After reviewing the risks and                            benefits, the patient was deemed in satisfactory                            condition to undergo the procedure.  After obtaining informed consent, the endoscope was                            passed under direct vision. Throughout the                            procedure, the patient's blood pressure, pulse, and                            oxygen saturations were monitored continuously. The                            GIF [id number] #[id number] was introduced through the                            mouth, and advanced to  the second part of duodenum.                            The upper GI endoscopy was accomplished without                            difficulty. The patient tolerated the procedure                            well. Scope In: Scope Out: Findings:                 Two benign-appearing, intrinsic moderate                            (circumferential scarring or stenosis; an endoscope                            may pass) stenoses were found 34 cm and 40 cm from                            the incisors. The narrowest stenosis measured 1.2                            cm (inner diameter). The stenoses were traversed. A                            TTS dilator was passed through the scope. Dilation                            with a 15-16.5-18 mm balloon dilator was performed                            to 16.5 mm. The dilation site was examined and                            showed moderate mucosal disruption.  A small hiatal hernia was present.                           The entire examined stomach was normal.                           The examined duodenum was normal. Complications:            No immediate complications. Estimated Blood Loss:     Estimated blood loss was minimal. Impression:               - Benign-appearing esophageal stenoses. Dilated to                            16.5 mm with balloon.                           - Small hiatal hernia.                           - Normal stomach.                           - Normal examined duodenum.                           - No specimens collected. Recommendation:           - Patient has a contact number available for                            emergencies. The signs and symptoms of potential                            delayed complications were discussed with the                            patient. Return to normal activities tomorrow.                            Written discharge instructions were provided to the                             patient.                           - Resume previous diet. Soft diet recommended.                           - Continue present medications. Omeprazole  40 mg                            daily is recommended to reduce GERD and reduce risk                            of re-stricturing in the esophagus.                           -  Repeat upper endoscopy PRN for retreatment. Gordy CHRISTELLA Starch, MD 11/07/2023 11:24:10 AM This report has been signed electronically.

## 2023-11-08 ENCOUNTER — Telehealth: Payer: Self-pay

## 2023-11-08 NOTE — Telephone Encounter (Signed)
  Follow up Call-     11/07/2023   10:04 AM 02/16/2022    7:19 AM  Call back number  Post procedure Call Back phone  # [phone]  Permission to leave phone message Yes Yes     Patient questions:  Do you have a fever, pain , or abdominal swelling? No. Pain Score  0 *  Have you tolerated food without any problems? No.  Have you been able to return to your normal activities? Yes.    Do you have any questions about your discharge instructions: Diet   No. Medications  No. Follow up visit  No.  Do you have questions or concerns about your Care? No.  Actions: * If pain score is 4 or above: No action needed, pain <4.  Pt states feeling ok but has had some diarrhea since the procedure and hasn't really ate too much, states he had some oatmeal, applesauce and banana and chocolate pudding this AM, has propel at home.  Encouraged pt to drink propel, water and to hold off on chocolate until diarrhea is gone.  Encouraged toast, banana, oatmeal is ok, applesauce and lots of water and drinking the propel to hydrate. Encourage pt to call primary care or Dr Pamula office if any further issues, pt verb understanding.

## 2023-11-09 ENCOUNTER — Telehealth: Payer: Self-pay | Admitting: Internal Medicine

## 2023-11-09 ENCOUNTER — Encounter (HOSPITAL_BASED_OUTPATIENT_CLINIC_OR_DEPARTMENT_OTHER): Payer: Medicare Other

## 2023-11-09 NOTE — Telephone Encounter (Signed)
 PT is on omeprazole  and feels he does not need it after doing a Microbiologist. Please advise.li

## 2023-11-09 NOTE — Telephone Encounter (Signed)
 Discussed with pt that he needs to continue taking the omeprazole  40mg  daily to control gerd and reduce the risk of the stricture forming again. Pt states he read online that you shouldn't take the med for more that 14 days. Discussed with him we have people that take if daily. Pt asked who do I believe the computer or the doctor. Discussed with pt that he doesn't have to take the med if he doesn't want to but he should go with what his doctor told him. Pt states he will take the omeprazole  40mg .

## 2023-11-13 ENCOUNTER — Encounter (HOSPITAL_BASED_OUTPATIENT_CLINIC_OR_DEPARTMENT_OTHER): Payer: Medicare Other

## 2023-11-13 NOTE — Telephone Encounter (Signed)
Pt aware.

## 2023-11-13 NOTE — Telephone Encounter (Signed)
Okay famotidine 20 mg twice daily Discontinue PPI

## 2023-11-13 NOTE — Telephone Encounter (Signed)
Inbound call from patient, states he believes omeprazole is counter acting his heart medicine, would like to discuss further with a nurse.

## 2023-11-13 NOTE — Telephone Encounter (Signed)
Pt calling back about his omeprazole 40mg  he is supposed to be taking BID. He states he took this some years back and it caused him to have hallucinations at night. Pt reports he had some hallucinations last night and he googled and found that the omeprazole and his metoprolol tartrate "don't jive." Pt wants to know if he can just stop the omeprazole and take his acid reducing medicine 20mg  BID. Pt could not state the name of the medication but thinks it may be pepcid. Please advise.

## 2023-11-15 ENCOUNTER — Encounter (HOSPITAL_BASED_OUTPATIENT_CLINIC_OR_DEPARTMENT_OTHER): Payer: Medicare Other

## 2023-11-20 ENCOUNTER — Encounter (HOSPITAL_BASED_OUTPATIENT_CLINIC_OR_DEPARTMENT_OTHER): Payer: Medicare Other | Admitting: Physical Therapy

## 2023-11-23 ENCOUNTER — Encounter (HOSPITAL_BASED_OUTPATIENT_CLINIC_OR_DEPARTMENT_OTHER): Payer: Medicare Other | Admitting: Physical Therapy

## 2023-11-27 ENCOUNTER — Other Ambulatory Visit (HOSPITAL_BASED_OUTPATIENT_CLINIC_OR_DEPARTMENT_OTHER): Payer: Self-pay

## 2023-11-27 ENCOUNTER — Encounter (HOSPITAL_BASED_OUTPATIENT_CLINIC_OR_DEPARTMENT_OTHER): Payer: Medicare Other

## 2023-11-28 ENCOUNTER — Ambulatory Visit (HOSPITAL_BASED_OUTPATIENT_CLINIC_OR_DEPARTMENT_OTHER): Payer: Medicare Other | Admitting: Cardiology

## 2023-11-28 ENCOUNTER — Encounter (HOSPITAL_BASED_OUTPATIENT_CLINIC_OR_DEPARTMENT_OTHER): Payer: Self-pay | Admitting: Cardiology

## 2023-11-28 VITALS — BP 108/60 | HR 96 | Ht 71.0 in | Wt 212.1 lb

## 2023-11-28 DIAGNOSIS — I10 Essential (primary) hypertension: Secondary | ICD-10-CM

## 2023-11-28 DIAGNOSIS — I4821 Permanent atrial fibrillation: Secondary | ICD-10-CM | POA: Diagnosis not present

## 2023-11-28 DIAGNOSIS — Z8719 Personal history of other diseases of the digestive system: Secondary | ICD-10-CM | POA: Diagnosis not present

## 2023-11-28 DIAGNOSIS — D6869 Other thrombophilia: Secondary | ICD-10-CM | POA: Diagnosis not present

## 2023-11-28 DIAGNOSIS — I872 Venous insufficiency (chronic) (peripheral): Secondary | ICD-10-CM

## 2023-11-28 NOTE — Progress Notes (Signed)
 Cardiology Office Note:  .    Date:  11/28/2023  ID:  Glen [name], DOB 09-14-1928, MRN [id number] PCP: Sharon Seller, NP  [facility] Providers Cardiologist:  Jodelle Red, MD     History of Present Illness: .    Glen [name] is a 89 y.o. male with a hx of hypertension, hyperlidemia and permanent AF, previously on coumadin but now on no anticoagulation who is seen for follow up.    Pertinent history: 07/2020 hospitalization for GI bleed. ECG 07/14/20 afib RVR with PVC. Only cardiac medication at that visit was metoprolol, and heart rate was 95 bpm. I discussed changing to extended release metoprolol but he declined and would prefer to keep tartrate, as he said this was what Dr. Tresa Endo had recommended to him.   Today: Here with his son today. Recently had esophageal stricture dilation, started on prilosec but changed to famotidine due to med interactions (his concern was metoprolol). He also noted prior history of hallucinations on omeprazole. Has not felt back to baseline since this. Trying to manage his GERD symptoms with OTC as well. Has not seen any melena or hematochezia. I discussed that PPI ok with metoprolol from my standpoint.  Denies chest pain, shortness of breath at rest or with normal exertion. No PND, orthopnea, change in LE edema or unexpected weight gain. No syncope or palpitations. ROS otherwise negative except as noted.   ROS:  Please see the history of present illness. ROS otherwise negative except as noted.   Studies Reviewed: Marland Kitchen    EKG Interpretation Date/Time:  Wednesday November 28 2023 09:10:40 EST Ventricular Rate:  90 PR Interval:    QRS Duration:  76 QT Interval:  352 QTC Calculation: 430 R Axis:   11  Text Interpretation: Atrial fibrillation Low voltage QRS Cannot rule out Anterior infarct , age undetermined Confirmed by Jodelle Red [phone]) on 11/28/2023 9:56:02 AM   Physical Exam:    VS:  BP 108/60 (BP Location:  Right Arm, Patient Position: Sitting, Cuff Size: Normal)   Pulse 96   Ht 5\' 11"  (1.803 m)   Wt 212 lb 1.6 oz (96.2 kg)   SpO2 99%   BMI 29.58 kg/m    Wt Readings from Last 3 Encounters:  11/28/23 212 lb 1.6 oz (96.2 kg)  11/07/23 215 lb (97.5 kg)  10/31/23 215 lb 8 oz (97.8 kg)    GEN: Well nourished, well developed in no acute distress HEENT: Normal, moist mucous membranes NECK: No JVD CARDIAC: irregularly irregular rhythm, normal S1 and S2, no rubs or gallops. No murmur. VASCULAR: Radial and DP pulses 2+ bilaterally. No carotid bruits RESPIRATORY:  Clear to auscultation without rales, wheezing or rhonchi  ABDOMEN: Soft, non-tender, non-distended MUSCULOSKELETAL:  Ambulates independently SKIN: Warm and dry, mild nonpitting LE R >L edema NEUROLOGIC:  Alert and oriented x 3. No focal neuro deficits noted. PSYCHIATRIC:  Normal affect. Hard of hearing.  ASSESSMENT AND PLAN: .    LE edema: -no significant edema today. Most suggestive of chronic venous insufficiency, though may also have contribution from permanent afib -continue BID lasix 20 mg. Labs stable, reviewed -recommend compression stockings, elevation   History of food impaction/dysphagia, s/p esophageal dilation History of GI bleeding -see prior notes. Had recent esophageal dilation -He denies bleeding today, but declines anticoagulation, see below -he did not want to take omeprazole due to risk of interaction with metoprolol. From my standpoint, this risk is very low, and I'm ok with PPI if that  controls his symptoms better. There is no significant interaction when run through lexicomp, which I showed him today. If he had hallucinations on omeprazole I recommended discussing other options with Dr. Rhea Belton if symptoms not well controlled on current regimen   Atrial fibrillation, permanent -H/H has been stable and normal, no recent bleeding This patients CHA2DS2-VASc Score and unadjusted Ischemic Stroke Rate (% per year) is  equal to 4.8 % stroke rate/year from a score of 4 -rate controlled with metoprolol tartrate 100 mg BID. Has declined consolidation to succinate in the past. -we have discussed anticoagulation at length on multiple occasions, including today. We discussed risk of stroke vs. Bleed. He understands risk and elects not to restart anticoagulation   Hypertension: -well controlled on metoprolol and lasix today -bmet stable  Dispo: Follow-up in 6 months, or sooner as needed.   Signed, Jodelle Red, MD

## 2023-11-28 NOTE — Patient Instructions (Signed)

## 2023-11-30 ENCOUNTER — Encounter (HOSPITAL_BASED_OUTPATIENT_CLINIC_OR_DEPARTMENT_OTHER): Payer: Medicare Other

## 2024-01-08 ENCOUNTER — Other Ambulatory Visit (HOSPITAL_BASED_OUTPATIENT_CLINIC_OR_DEPARTMENT_OTHER): Payer: Self-pay | Admitting: Internal Medicine

## 2024-01-08 DIAGNOSIS — I10 Essential (primary) hypertension: Secondary | ICD-10-CM

## 2024-01-08 DIAGNOSIS — R600 Localized edema: Secondary | ICD-10-CM

## 2024-01-28 ENCOUNTER — Ambulatory Visit (INDEPENDENT_AMBULATORY_CARE_PROVIDER_SITE_OTHER): Admitting: Nurse Practitioner

## 2024-01-28 ENCOUNTER — Encounter: Payer: Self-pay | Admitting: Nurse Practitioner

## 2024-01-28 VITALS — BP 124/76 | HR 93 | Temp 98.1°F | Ht 71.0 in | Wt 211.6 lb

## 2024-01-28 DIAGNOSIS — R5381 Other malaise: Secondary | ICD-10-CM | POA: Diagnosis not present

## 2024-01-28 DIAGNOSIS — R600 Localized edema: Secondary | ICD-10-CM | POA: Diagnosis not present

## 2024-01-28 DIAGNOSIS — J302 Other seasonal allergic rhinitis: Secondary | ICD-10-CM

## 2024-01-28 DIAGNOSIS — K219 Gastro-esophageal reflux disease without esophagitis: Secondary | ICD-10-CM | POA: Diagnosis not present

## 2024-01-28 DIAGNOSIS — M159 Polyosteoarthritis, unspecified: Secondary | ICD-10-CM | POA: Diagnosis not present

## 2024-01-28 DIAGNOSIS — K222 Esophageal obstruction: Secondary | ICD-10-CM | POA: Diagnosis not present

## 2024-01-28 DIAGNOSIS — I4821 Permanent atrial fibrillation: Secondary | ICD-10-CM | POA: Diagnosis not present

## 2024-01-28 DIAGNOSIS — R002 Palpitations: Secondary | ICD-10-CM | POA: Diagnosis not present

## 2024-01-28 NOTE — Assessment & Plan Note (Signed)
 S/p dilatation

## 2024-01-28 NOTE — Assessment & Plan Note (Signed)
 Reports frequent palpitations EKG unchanged in office showing a fib, no elevation in HR at todays visit Will get blood work at this time

## 2024-01-28 NOTE — Assessment & Plan Note (Signed)
 Generalized OA, decrease in activity and mobility Continue tylenol - can use 1000 mg by mouth every 8 hours as needed pain Would benefit from PT

## 2024-01-28 NOTE — Progress Notes (Signed)
 Careteam: Patient Care Team: Verma Gobble, NP as PCP - General (Geriatric Medicine) Sheryle Donning, MD as PCP - Cardiology (Cardiology) Saint Cranker, MD as Rounding Team (Internal Medicine) Tobin Forts, MD as Consulting Physician (Gastroenterology)  PLACE OF SERVICE:  [facility]  Advanced Directive information    Allergies  Allergen Reactions   Omeprazole  Other (See Comments)    hallucinations   Protonix  [Pantoprazole ] Other (See Comments)    Hallucinations   Codeine Other (See Comments)    constipation   Ferrous Sulfate  Itching and Swelling    Chief Complaint  Patient presents with   Tachycardia    Heart Racing. Patient stated that ever since GI placed him on Omeprazole  in Feb he reacted to it. GI has since taken him off of it but still has a racing heart rate.     HPI:  Discussed the use of AI scribe software for clinical note transcription with the patient, who gave verbal consent to proceed.  History of Present Illness   Glen [name] is a 89 year old male with atrial fibrillation who presents with palpitations  He experiences palpitations and a sensation of his heart racing, particularly when standing up in the morning or walking short distances. He notes feeling lightheaded and sometimes as if his heart 'quit'. These symptoms have been more noticeable since his esophagus was stretched in January.   He has a history of atrial fibrillation and was previously on Coumadin  but is no longer on anticoagulants due to a history of falls and a gastrointestinal bleed. He is currently taking metoprolol  100 mg twice a day, which he feels manages his symptoms.  He reports generalized body aches from 'the top of my head to the bottom of my feet', especially upon standing in the morning. Tylenol  taken at 6 AM provides relief for a couple of hours, although it is supposed to last six hours.  He experiences shortness of breath when walking short distances,  which has worsened since his esophagus procedure. He feels deconditioned and lacks strength, unable to walk to the front desk without getting out of breath.  Review of Systems:  Review of Systems  Constitutional:  Negative for chills, fever and weight loss.  HENT:  Negative for tinnitus.   Respiratory:  Positive for shortness of breath (with activity). Negative for cough and sputum production.   Cardiovascular:  Positive for palpitations. Negative for chest pain and leg swelling.  Gastrointestinal:  Positive for heartburn. Negative for abdominal pain, constipation and diarrhea.  Genitourinary:  Negative for dysuria, frequency and urgency.  Musculoskeletal:  Positive for joint pain and myalgias. Negative for back pain and falls.  Skin: Negative.   Neurological:  Positive for headaches. Negative for dizziness.  Psychiatric/Behavioral:  Negative for depression. The patient does not have insomnia.     Past Medical History:  Diagnosis Date   Atrial fibrillation [facility])    Atrial fibrillation, chronic (HCC) 03/13/2008   Qualifier: Diagnosis of  By: Minnette Amato  MD, Ronie Cohen    Diverticulitis    Esophageal dysmotility    Esophageal dysphagia 07/29/2018   Family history of diabetes insipidus    FHx: colon cancer    H. pylori infection    Hiatal hernia    History of colonic polyps    Hyperlipidemia    Hypertension    IDA (iron deficiency anemia)    Obesity    Palpitation    Peptic ulcer disease    Presbyesophagus    Schatzki's  ring    Past Surgical History:  Procedure Laterality Date   2-D Echocardiogram  November 2009, 2011   Anal Fistula Repair     BALLOON DILATION N/A 01/17/2022   Procedure: Debborah Fairly DILATION;  Surgeon: Lajuan Pila, MD;  Location: [facility] ENDOSCOPY;  Service: Gastroenterology;  Laterality: N/A;   BIOPSY  07/14/2020   Procedure: BIOPSY;  Surgeon: Nannette Babe, MD;  Location: [facility] ENDOSCOPY;  Service: Gastroenterology;;   BIOPSY  01/17/2022   Procedure: BIOPSY;  Surgeon:  Lajuan Pila, MD;  Location: [facility] ENDOSCOPY;  Service: Gastroenterology;;   CARDIAC VALVE SURGERY  1996   status post balloon valvuloplasty at [facility]   CATARACT EXTRACTION  2004   COLONOSCOPY  December 2005   ESOPHAGOGASTRODUODENOSCOPY  07/14/2020   ESOPHAGOGASTRODUODENOSCOPY N/A 01/17/2022   Procedure: ESOPHAGOGASTRODUODENOSCOPY (EGD);  Surgeon: Lajuan Pila, MD;  Location: [facility];  Service: Gastroenterology;  Laterality: N/A;   ESOPHAGOGASTRODUODENOSCOPY N/A 10/31/2023   Procedure: ESOPHAGOGASTRODUODENOSCOPY (EGD);  Surgeon: Tobin Forts, MD;  Location: [facility]OSCOPY;  Service: Gastroenterology;  Laterality: N/A;   ESOPHAGOGASTRODUODENOSCOPY (EGD) WITH PROPOFOL  N/A 07/14/2020   Procedure: ESOPHAGOGASTRODUODENOSCOPY (EGD) WITH PROPOFOL ;  Surgeon: Nannette Babe, MD;  Location: [facility] ENDOSCOPY;  Service: Gastroenterology;  Laterality: N/A;   ETT  1991   Negative   FOREIGN BODY REMOVAL  10/31/2023   Procedure: FOREIGN BODY REMOVAL;  Surgeon: Tobin Forts, MD;  Location: [facility]OS[facility];  Service: Gastroenterology;;   HEMOSTASIS CLIP PLACEMENT  07/14/2020   HEMOSTASIS CLIP PLACEMENT   HEMOSTASIS CLIP PLACEMENT  07/14/2020   Procedure: HEMOSTASIS CLIP PLACEMENT;  Surgeon: Nannette Babe, MD;  Location: [facility] ENDOSCOPY;  Service: Gastroenterology;;   KNEE ARTHROSCOPY     Right   TONSILLECTOMY     Social History:   reports that he has never smoked. He has been exposed to tobacco smoke. He has never used smokeless tobacco. He reports that he does not drink alcohol and does not use drugs.  Family History  Problem Relation Age of Onset   Cancer Mother    Heart Problems Father    Stroke Father    Heart attack Father    Breast cancer Sister    Stroke Brother    Heart Problems Brother    Esophageal cancer Neg Hx    Colon cancer Neg Hx    Stomach cancer Neg Hx    Rectal cancer Neg Hx     Medications: Patient's Medications  New Prescriptions   No medications on file  Previous Medications    ACETAMINOPHEN  (TYLENOL ) 500 MG TABLET    Take 1,500 mg by mouth at bedtime. And 2 in the morning   FAMOTIDINE  (PEPCID ) 20 MG TABLET    Take 20 mg by mouth daily.   FUROSEMIDE  (LASIX ) 20 MG TABLET    Take 1 tablet by mouth twice daily   LORATADINE (CLARITIN) 10 MG TABLET    Take 10 mg by mouth daily.   METAMUCIL FIBER PO    Take 3 tablets by mouth in the morning and at bedtime.   METOPROLOL  TARTRATE (LOPRESSOR ) 100 MG TABLET    Take 1 tablet by mouth twice daily.   MULTIPLE VITAMIN (MULTIVITAMIN) TABLET    Take 1 tablet by mouth daily.  Modified Medications   No medications on file  Discontinued Medications   No medications on file    Physical Exam:  Vitals:   01/28/24 1444  BP: 124/76  Pulse: 93  Temp: 98.1 F (36.7 C)  SpO2: 97%  Weight: 211 lb  9.6 oz (96 kg)  Height: 5\' 11"  (1.803 m)   Body mass index is 29.51 kg/m. Wt Readings from Last 3 Encounters:  01/28/24 211 lb 9.6 oz (96 kg)  11/28/23 212 lb 1.6 oz (96.2 kg)  11/07/23 215 lb (97.5 kg)    Physical Exam Constitutional:      General: He is not in acute distress.    Appearance: He is well-developed. He is not diaphoretic.  HENT:     Head: Normocephalic and atraumatic.     Right Ear: External ear normal.     Left Ear: External ear normal.     Mouth/Throat:     Pharynx: No oropharyngeal exudate.  Eyes:     Conjunctiva/sclera: Conjunctivae normal.     Pupils: Pupils are equal, round, and reactive to light.  Cardiovascular:     Rate and Rhythm: Normal rate. Rhythm irregular.     Heart sounds: Normal heart sounds.  Pulmonary:     Effort: Pulmonary effort is normal.     Breath sounds: Normal breath sounds.  Abdominal:     General: Bowel sounds are normal.     Palpations: Abdomen is soft.  Musculoskeletal:        General: No tenderness.     Cervical back: Normal range of motion and neck supple.     Right lower leg: Edema present.     Left lower leg: Edema present.  Skin:    General: Skin is warm and dry.   Neurological:     Mental Status: He is alert and oriented to person, place, and time.     Labs reviewed: Basic Metabolic Panel: Recent Labs    02/19/23 1547 09/03/23 1520 09/06/23 0940  NA 141 140 141  K 5.0 4.2 3.7  CL 105 106 110  CO2 27 26 23   GLUCOSE 117* 113* 101*  BUN 27* 25 27*  CREATININE 0.97 0.93 0.92  CALCIUM 9.5 9.1 8.6*   Liver Function Tests: Recent Labs    02/19/23 1547 09/03/23 1520 09/06/23 0940  AST 23 23 27   ALT 18 23 22   ALKPHOS  --   --  69  BILITOT 0.5 0.7 1.2*  PROT 6.7 6.6 6.2*  ALBUMIN  --   --  3.6   No results for input(s): "LIPASE", "AMYLASE" in the last 8760 hours. No results for input(s): "AMMONIA" in the last 8760 hours. CBC: Recent Labs    09/03/23 1520 09/06/23 0940  WBC 7.8 8.0  NEUTROABS 4,875 5.8  HGB 16.0 15.1  HCT 47.9 43.7  MCV 95.6 95.8  PLT 202 183   Lipid Panel: No results for input(s): "CHOL", "HDL", "LDLCALC", "TRIG", "CHOLHDL", "LDLDIRECT" in the last 8760 hours. TSH: No results for input(s): "TSH" in the last 8760 hours. A1C: Lab Results  Component Value Date   HGBA1C 5.6 07/11/2022     Assessment/Plan Permanent atrial fibrillation (HCC) Assessment & Plan: Reports frequent palpitations EKG unchanged in office showing a fib, no elevation in HR at todays visit Will get blood work at this time  Orders: -     COMPLETE METABOLIC PANEL WITHOUT GFR -     CBC with Differential/Platelet -     TSH -     EKG 12-Lead  Bilateral leg edema Stable on lasix  20 mg BID --encouraged to elevate legs above level of heart as tolerates, low sodium diet, compression hose as tolerates (on in am, off in pm)  Debility -decrease in activities resulting in shortness of breath with minimal exertion  and possible related to elevate HR.  Discuss PT if labs normal.   Seasonal allergies To take Loratadine or cetrizine (generic for Claritin or zyrtec) 10 mg by mouth daily for allergies.   Gastroesophageal reflux disease  without esophagitis Continues to use pepcid  at bedtime  Esophageal stricture Assessment & Plan: S/p dilatation.   Osteoarthritis of multiple joints, unspecified osteoarthritis type Assessment & Plan: Generalized OA, decrease in activity and mobility Continue tylenol - can use 1000 mg by mouth every 8 hours as needed pain Would benefit from PT  To keep follow up as scheduled   Kaydenn Mclear K. Denney Fisherman [facility] [phone]

## 2024-01-29 ENCOUNTER — Encounter: Payer: Self-pay | Admitting: Nurse Practitioner

## 2024-01-29 LAB — CBC WITH DIFFERENTIAL/PLATELET
Absolute Lymphocytes: 1919 {cells}/uL (ref 850–3900)
Absolute Monocytes: 585 {cells}/uL (ref 200–950)
Basophils Absolute: 47 {cells}/uL (ref 0–200)
Basophils Relative: 0.6 %
Eosinophils Absolute: 148 {cells}/uL (ref 15–500)
Eosinophils Relative: 1.9 %
HCT: 48 % (ref 38.5–50.0)
Hemoglobin: 16.3 g/dL (ref 13.2–17.1)
MCH: 33.1 pg — ABNORMAL HIGH (ref 27.0–33.0)
MCHC: 34 g/dL (ref 32.0–36.0)
MCV: 97.6 fL (ref 80.0–100.0)
MPV: 10.6 fL (ref 7.5–12.5)
Monocytes Relative: 7.5 %
Neutro Abs: 5101 {cells}/uL (ref 1500–7800)
Neutrophils Relative %: 65.4 %
Platelets: 206 10*3/uL (ref 140–400)
RBC: 4.92 10*6/uL (ref 4.20–5.80)
RDW: 12.2 % (ref 11.0–15.0)
Total Lymphocyte: 24.6 %
WBC: 7.8 10*3/uL (ref 3.8–10.8)

## 2024-01-29 LAB — COMPLETE METABOLIC PANEL WITHOUT GFR
AG Ratio: 1.9 (calc) (ref 1.0–2.5)
ALT: 18 U/L (ref 9–46)
AST: 20 U/L (ref 10–35)
Albumin: 4.3 g/dL (ref 3.6–5.1)
Alkaline phosphatase (APISO): 86 U/L (ref 35–144)
BUN: 24 mg/dL (ref 7–25)
CO2: 30 mmol/L (ref 20–32)
Calcium: 9.4 mg/dL (ref 8.6–10.3)
Chloride: 104 mmol/L (ref 98–110)
Creat: 0.92 mg/dL (ref 0.70–1.22)
Globulin: 2.3 g/dL (ref 1.9–3.7)
Glucose, Bld: 98 mg/dL (ref 65–139)
Potassium: 4.8 mmol/L (ref 3.5–5.3)
Sodium: 140 mmol/L (ref 135–146)
Total Bilirubin: 0.6 mg/dL (ref 0.2–1.2)
Total Protein: 6.6 g/dL (ref 6.1–8.1)

## 2024-01-29 LAB — TSH: TSH: 3.51 m[IU]/L (ref 0.40–4.50)

## 2024-01-29 NOTE — Addendum Note (Signed)
 Addended by: Evanee Lubrano K on: 01/29/2024 04:03 PM   Modules accepted: Orders

## 2024-01-30 ENCOUNTER — Encounter (HOSPITAL_BASED_OUTPATIENT_CLINIC_OR_DEPARTMENT_OTHER): Payer: Self-pay

## 2024-01-30 ENCOUNTER — Encounter: Payer: Self-pay | Admitting: Nurse Practitioner

## 2024-01-30 NOTE — Telephone Encounter (Signed)
Message routed to PCP Eubanks, Jessica K, NP  

## 2024-01-31 NOTE — Telephone Encounter (Signed)
Message routed back to PCP Dewaine Oats, Carlos American, NP

## 2024-01-31 NOTE — Telephone Encounter (Signed)
Message routed to PCP Eubanks, Jessica K, NP  

## 2024-02-01 ENCOUNTER — Telehealth: Payer: Self-pay

## 2024-02-01 NOTE — Telephone Encounter (Signed)
 Copied from CRM [id number]. Topic: General - Other >> Feb 01, 2024 10:22 AM Wynona Hedger wrote: Reason for CRM: Patient refused home health physical therapy

## 2024-02-01 NOTE — Telephone Encounter (Signed)
Message routed to PCP Eubanks, Jessica K, NP  

## 2024-02-01 NOTE — Telephone Encounter (Signed)
Okay to cancel referral

## 2024-03-03 ENCOUNTER — Ambulatory Visit: Payer: Medicare Other | Admitting: Nurse Practitioner

## 2024-03-14 ENCOUNTER — Ambulatory Visit: Admitting: Nurse Practitioner

## 2024-03-19 ENCOUNTER — Other Ambulatory Visit (HOSPITAL_BASED_OUTPATIENT_CLINIC_OR_DEPARTMENT_OTHER): Payer: Self-pay | Admitting: Cardiology

## 2024-03-19 DIAGNOSIS — I10 Essential (primary) hypertension: Secondary | ICD-10-CM

## 2024-03-19 DIAGNOSIS — R600 Localized edema: Secondary | ICD-10-CM

## 2024-03-19 DIAGNOSIS — I4821 Permanent atrial fibrillation: Secondary | ICD-10-CM

## 2024-05-30 ENCOUNTER — Ambulatory Visit (HOSPITAL_BASED_OUTPATIENT_CLINIC_OR_DEPARTMENT_OTHER): Admitting: Cardiology

## 2024-05-30 ENCOUNTER — Encounter (HOSPITAL_BASED_OUTPATIENT_CLINIC_OR_DEPARTMENT_OTHER): Payer: Self-pay | Admitting: Cardiology

## 2024-05-30 VITALS — BP 110/68 | HR 100 | Ht 71.0 in | Wt 209.3 lb

## 2024-05-30 DIAGNOSIS — R600 Localized edema: Secondary | ICD-10-CM

## 2024-05-30 DIAGNOSIS — Z8719 Personal history of other diseases of the digestive system: Secondary | ICD-10-CM | POA: Diagnosis not present

## 2024-05-30 DIAGNOSIS — I872 Venous insufficiency (chronic) (peripheral): Secondary | ICD-10-CM | POA: Diagnosis not present

## 2024-05-30 DIAGNOSIS — I10 Essential (primary) hypertension: Secondary | ICD-10-CM | POA: Diagnosis not present

## 2024-05-30 DIAGNOSIS — I4821 Permanent atrial fibrillation: Secondary | ICD-10-CM

## 2024-05-30 DIAGNOSIS — D6869 Other thrombophilia: Secondary | ICD-10-CM | POA: Diagnosis not present

## 2024-05-30 NOTE — Progress Notes (Signed)
 Cardiology Office Note:  .    Date:  05/30/2024  ID:  Glen [name], DOB Sep 15, 1928, MRN [id number] PCP: Caro Harlene POUR, NP  [facility] Providers Cardiologist:  Shelda Bruckner, MD     History of Present Illness: .    Glen [name] is a 89 y.o. male with a hx of hypertension, hyperlidemia and permanent AF, previously on coumadin  but now on no anticoagulation who is seen for follow up.    Pertinent history: 07/2020 hospitalization for GI bleed. ECG 07/14/20 afib RVR with PVC. Only cardiac medication at that visit was metoprolol , and heart rate was 95 bpm. I discussed changing to extended release metoprolol  but he declined and would prefer to keep tartrate, as he said this was what Dr. Burnard had recommended to him.   Today: Overall doing well. Notes that if he stands up for too long he gets a little unsteady. No recent falls, did have an issue a few months ago when he was out walking and fell, no LOC, didn't hit his head. No syncope.   Leg swelling has been minimal and stable.   No recent GI issues. Denies recent bleeding. See below re: anticoagulation.  Denies chest pain, shortness of breath at rest or with normal exertion. No PND, orthopnea, change in LE edema or unexpected weight gain. No syncope or palpitations. ROS otherwise negative except as noted.   ROS:  Please see the history of present illness. ROS otherwise negative except as noted.   Studies Reviewed: SABRA        Physical Exam:    VS:  BP 110/68   Pulse 100   Ht 5' 11 (1.803 m)   Wt 209 lb 4.8 oz (94.9 kg)   SpO2 96%   BMI 29.19 kg/m    Wt Readings from Last 3 Encounters:  05/30/24 209 lb 4.8 oz (94.9 kg)  01/28/24 211 lb 9.6 oz (96 kg)  11/28/23 212 lb 1.6 oz (96.2 kg)    GEN: Well nourished, well developed in no acute distress HEENT: Normal, moist mucous membranes NECK: No JVD CARDIAC: irregularly irregular rhythm, normal S1 and S2, no rubs or gallops. No murmur. VASCULAR: Radial and DP  pulses 2+ bilaterally. No carotid bruits RESPIRATORY:  Clear to auscultation without rales, wheezing or rhonchi  ABDOMEN: Soft, non-tender, non-distended MUSCULOSKELETAL:  Ambulates independently SKIN: Warm and dry, mild nonpitting LE R >L edema NEUROLOGIC:  Alert and oriented x 3. No focal neuro deficits noted. PSYCHIATRIC:  Normal affect. Hard of hearing.  ASSESSMENT AND PLAN: .    LE edema: -no significant edema today. Most suggestive of chronic venous insufficiency, though may also have contribution from permanent afib -continue BID lasix  20 mg. Labs stable, reviewed -recommend compression stockings, elevation   History of food impaction/dysphagia, s/p esophageal dilation History of GI bleeding -no recent bleeding that he has seen   Atrial fibrillation, permanent Secondary hypercoagulable state -H/H has been stable and normal, no recent bleeding This patients CHA2DS2-VASc Score and unadjusted Ischemic Stroke Rate (% per year) is equal to 4.8 % stroke rate/year from a score of 4 -rate controlled with metoprolol  tartrate 100 mg BID. Has declined consolidation to succinate in the past. -we have discussed anticoagulation at length on multiple occasions. We discussed risk of stroke vs. Bleed. He understands risk and elects not to restart anticoagulation   Hypertension: -well controlled on metoprolol  and lasix  today -bmet stable  Dispo: Follow-up in 6 months, or sooner as needed.   Signed, Marieta Markov  Lonni, MD

## 2024-05-30 NOTE — Patient Instructions (Signed)

## 2024-06-16 ENCOUNTER — Other Ambulatory Visit (HOSPITAL_BASED_OUTPATIENT_CLINIC_OR_DEPARTMENT_OTHER): Payer: Self-pay | Admitting: Cardiology

## 2024-06-16 DIAGNOSIS — I4821 Permanent atrial fibrillation: Secondary | ICD-10-CM

## 2024-06-16 DIAGNOSIS — I10 Essential (primary) hypertension: Secondary | ICD-10-CM

## 2024-06-16 DIAGNOSIS — R600 Localized edema: Secondary | ICD-10-CM

## 2024-06-30 ENCOUNTER — Other Ambulatory Visit (HOSPITAL_BASED_OUTPATIENT_CLINIC_OR_DEPARTMENT_OTHER): Payer: Self-pay

## 2024-06-30 ENCOUNTER — Ambulatory Visit: Admitting: Nurse Practitioner

## 2024-06-30 ENCOUNTER — Encounter: Payer: Self-pay | Admitting: Nurse Practitioner

## 2024-06-30 VITALS — BP 128/84 | HR 89 | Temp 97.0°F | Ht 71.0 in | Wt 204.8 lb

## 2024-06-30 DIAGNOSIS — M545 Low back pain, unspecified: Secondary | ICD-10-CM | POA: Diagnosis not present

## 2024-06-30 DIAGNOSIS — R600 Localized edema: Secondary | ICD-10-CM

## 2024-06-30 DIAGNOSIS — G8929 Other chronic pain: Secondary | ICD-10-CM

## 2024-06-30 DIAGNOSIS — I4821 Permanent atrial fibrillation: Secondary | ICD-10-CM | POA: Diagnosis not present

## 2024-06-30 DIAGNOSIS — K222 Esophageal obstruction: Secondary | ICD-10-CM | POA: Diagnosis not present

## 2024-06-30 DIAGNOSIS — Z23 Encounter for immunization: Secondary | ICD-10-CM

## 2024-06-30 LAB — CBC WITH DIFFERENTIAL/PLATELET
Absolute Lymphocytes: 2154 {cells}/uL (ref 850–3900)
Absolute Monocytes: 632 {cells}/uL (ref 200–950)
Basophils Absolute: 62 {cells}/uL (ref 0–200)
Basophils Relative: 0.7 %
Eosinophils Absolute: 178 {cells}/uL (ref 15–500)
Eosinophils Relative: 2 %
HCT: 45.9 % (ref 38.5–50.0)
Hemoglobin: 15.7 g/dL (ref 13.2–17.1)
MCH: 34.1 pg — ABNORMAL HIGH (ref 27.0–33.0)
MCHC: 34.2 g/dL (ref 32.0–36.0)
MCV: 99.8 fL (ref 80.0–100.0)
MPV: 10.9 fL (ref 7.5–12.5)
Monocytes Relative: 7.1 %
Neutro Abs: 5874 {cells}/uL (ref 1500–7800)
Neutrophils Relative %: 66 %
Platelets: 189 Thousand/uL (ref 140–400)
RBC: 4.6 Million/uL (ref 4.20–5.80)
RDW: 11.9 % (ref 11.0–15.0)
Total Lymphocyte: 24.2 %
WBC: 8.9 Thousand/uL (ref 3.8–10.8)

## 2024-06-30 LAB — COMPREHENSIVE METABOLIC PANEL WITH GFR
AG Ratio: 1.9 (calc) (ref 1.0–2.5)
ALT: 17 U/L (ref 9–46)
AST: 20 U/L (ref 10–35)
Albumin: 4.1 g/dL (ref 3.6–5.1)
Alkaline phosphatase (APISO): 77 U/L (ref 35–144)
BUN/Creatinine Ratio: 31 (calc) — ABNORMAL HIGH (ref 6–22)
BUN: 31 mg/dL — ABNORMAL HIGH (ref 7–25)
CO2: 24 mmol/L (ref 20–32)
Calcium: 9 mg/dL (ref 8.6–10.3)
Chloride: 107 mmol/L (ref 98–110)
Creat: 1 mg/dL (ref 0.70–1.22)
Globulin: 2.2 g/dL (ref 1.9–3.7)
Glucose, Bld: 102 mg/dL — ABNORMAL HIGH (ref 65–99)
Potassium: 4.3 mmol/L (ref 3.5–5.3)
Sodium: 141 mmol/L (ref 135–146)
Total Bilirubin: 0.6 mg/dL (ref 0.2–1.2)
Total Protein: 6.3 g/dL (ref 6.1–8.1)
eGFR: 70 mL/min/1.73m2 (ref >=60)

## 2024-06-30 MED ORDER — COMIRNATY 30 MCG/0.3ML IM SUSY
0.3000 mL | PREFILLED_SYRINGE | Freq: Once | INTRAMUSCULAR | 0 refills | Status: AC
  Filled 2024-06-30: qty 0.3, 1d supply, fill #0

## 2024-06-30 NOTE — Progress Notes (Signed)
 Careteam: Patient Care Team: Caro Harlene POUR, NP as PCP - General (Geriatric Medicine) Lonni Slain, MD as PCP - Cardiology (Cardiology) Bobbette Sniff, MD as Rounding Team (Internal Medicine) Abran Norleen SAILOR, MD as Consulting Physician (Gastroenterology)  PLACE OF SERVICE:  [facility]  Advanced Directive information Does Patient Have a Medical Advance Directive?: Yes, Type of Advance Directive: Healthcare Power of [place];Living will, Does patient want to make changes to medical advance directive?: No - Patient declined  Allergies  Allergen Reactions   Omeprazole  Other (See Comments)    hallucinations   Protonix  [Pantoprazole ] Other (See Comments)    Hallucinations   Codeine Other (See Comments)    constipation   Ferrous Sulfate  Itching and Swelling    Chief Complaint  Patient presents with   Medical Management of Chronic Issues    Medical Management of Chronic Issues. 6 Month follow up. To discuss need for Tdap, Covid and AWV    HPI:  Discussed the use of AI scribe software for clinical note transcription with the patient, who gave verbal consent to proceed.  History of Present Illness Glen [name] is a 89 year old male who presents for a routine follow-up.  He experiences generalized body aches and significant leg and back pain. The back pain has been persistent and was previously managed with physical therapy, which he discontinued due to lack of progress in the past. He takes regular strength Tylenol , three tablets at night and recently three in the morning as well, which provides temporary relief for about two hours. Son would like him to continue PT but he is not interested.   He has a history of esophageal issues, having undergone esophageal dilation in February of this year. He was prescribed Prilosec but discontinued it due to hallucinations. He currently takes famotidine  20 mg daily for acid reflux. He reports occasional food sticking,  particularly with chicken and potatoes, and sometimes experiences water regurgitation after eating. He continues to eat very fast.   He takes Lasix  twice daily to manage leg swelling. He denies current leg swelling but notes that it would occur without the medication.  He experiences occasional palpitations, most recently at 4 AM, which resolved after using the bathroom and returning to bed.  He lives independently with his wife in their own home and has a son who lives 20 miles away. He tries to maintain physical activity by walking inside his house.    Review of Systems:  Review of Systems  Constitutional:  Negative for chills, fever and weight loss.  HENT:  Negative for tinnitus.   Respiratory:  Negative for cough, sputum production and shortness of breath.   Cardiovascular:  Negative for chest pain, palpitations and leg swelling.  Gastrointestinal:  Negative for abdominal pain, constipation, diarrhea and heartburn.  Genitourinary:  Negative for dysuria, frequency and urgency.  Musculoskeletal:  Negative for back pain, falls, joint pain and myalgias.  Skin: Negative.   Neurological:  Negative for dizziness and headaches.  Psychiatric/Behavioral:  Negative for depression and memory loss. The patient does not have insomnia.     Past Medical History:  Diagnosis Date   Atrial fibrillation [facility])    Atrial fibrillation, chronic (HCC) 03/13/2008   Qualifier: Diagnosis of  By: Jame  MD, Maude FALCON    Diverticulitis    Esophageal dysmotility    Esophageal dysphagia 07/29/2018   Family history of diabetes insipidus    FHx: colon cancer    H. pylori infection    Hiatal hernia  History of colonic polyps    History of GI bleed 10/04/2022   Hyperlipidemia    Hypertension    IDA (iron deficiency anemia)    Obesity    Palpitation    Peptic ulcer disease    Presbyesophagus    Schatzki's ring    Past Surgical History:  Procedure Laterality Date   2-D Echocardiogram  November  2009, 2011   Anal Fistula Repair     BALLOON DILATION N/A 01/17/2022   Procedure: MERRILL DILATION;  Surgeon: Charlanne Groom, MD;  Location: [facility] ENDOSCOPY;  Service: Gastroenterology;  Laterality: N/A;   BIOPSY  07/14/2020   Procedure: BIOPSY;  Surgeon: Albertus Gordy HERO, MD;  Location: [facility] ENDOSCOPY;  Service: Gastroenterology;;   BIOPSY  01/17/2022   Procedure: BIOPSY;  Surgeon: Charlanne Groom, MD;  Location: [facility] ENDOSCOPY;  Service: Gastroenterology;;   CARDIAC VALVE SURGERY  1996   status post balloon valvuloplasty at [facility]   CATARACT EXTRACTION  2004   COLONOSCOPY  December 2005   ESOPHAGOGASTRODUODENOSCOPY  07/14/2020   ESOPHAGOGASTRODUODENOSCOPY N/A 01/17/2022   Procedure: ESOPHAGOGASTRODUODENOSCOPY (EGD);  Surgeon: Charlanne Groom, MD;  Location: [facility];  Service: Gastroenterology;  Laterality: N/A;   ESOPHAGOGASTRODUODENOSCOPY N/A 10/31/2023   Procedure: ESOPHAGOGASTRODUODENOSCOPY (EGD);  Surgeon: Abran Norleen SAILOR, MD;  Location: [facility];  Service: Gastroenterology;  Laterality: N/A;   ESOPHAGOGASTRODUODENOSCOPY (EGD) WITH PROPOFOL  N/A 07/14/2020   Procedure: ESOPHAGOGASTRODUODENOSCOPY (EGD) WITH PROPOFOL ;  Surgeon: Albertus Gordy HERO, MD;  Location: [facility] ENDOSCOPY;  Service: Gastroenterology;  Laterality: N/A;   ETT  1991   Negative   FOREIGN BODY REMOVAL  10/31/2023   Procedure: FOREIGN BODY REMOVAL;  Surgeon: Abran Norleen SAILOR, MD;  Location: [facility];  Service: Gastroenterology;;   HEMOSTASIS CLIP PLACEMENT  07/14/2020   HEMOSTASIS CLIP PLACEMENT   HEMOSTASIS CLIP PLACEMENT  07/14/2020   Procedure: HEMOSTASIS CLIP PLACEMENT;  Surgeon: Albertus Gordy HERO, MD;  Location: [facility] ENDOSCOPY;  Service: Gastroenterology;;   KNEE ARTHROSCOPY     Right   TONSILLECTOMY     Social History:   reports that he has never smoked. He has been exposed to tobacco smoke. He has never used smokeless tobacco. He reports that he does not drink alcohol and does not use drugs.  Family History  Problem Relation Age of  Onset   Cancer Mother    Heart Problems Father    Stroke Father    Heart attack Father    Breast cancer Sister    Stroke Brother    Heart Problems Brother    Esophageal cancer Neg Hx    Colon cancer Neg Hx    Stomach cancer Neg Hx    Rectal cancer Neg Hx     Medications: Patient's Medications  New Prescriptions   No medications on file  Previous Medications   ACETAMINOPHEN  (TYLENOL ) 500 MG TABLET    Take 1,500 mg by mouth at bedtime. And 2 in the morning   FAMOTIDINE  (PEPCID ) 20 MG TABLET    Take 20 mg by mouth daily.   FUROSEMIDE  (LASIX ) 20 MG TABLET    Take 1 tablet by mouth twice daily   LORATADINE (CLARITIN) 10 MG TABLET    Take 10 mg by mouth daily.   METAMUCIL FIBER PO    Take 3 tablets by mouth in the morning and at bedtime.   METOPROLOL  TARTRATE (LOPRESSOR ) 100 MG TABLET    Take 1 tablet by mouth twice daily   MULTIPLE VITAMIN (MULTIVITAMIN) TABLET    Take 1 tablet by mouth daily.  Modified Medications   No medications on file  Discontinued Medications   No medications on file    Physical Exam:  Vitals:   06/30/24 1422  BP: 128/84  Pulse: 89  Temp: (!) 97 F (36.1 C)  SpO2: 99%  Weight: 204 lb 12.8 oz (92.9 kg)  Height: 5' 11 (1.803 m)   Body mass index is 28.56 kg/m. Wt Readings from Last 3 Encounters:  06/30/24 204 lb 12.8 oz (92.9 kg)  05/30/24 209 lb 4.8 oz (94.9 kg)  01/28/24 211 lb 9.6 oz (96 kg)    Physical Exam Constitutional:      General: He is not in acute distress.    Appearance: He is well-developed. He is not diaphoretic.  HENT:     Head: Normocephalic and atraumatic.     Right Ear: External ear normal.     Left Ear: External ear normal.     Mouth/Throat:     Pharynx: No oropharyngeal exudate.  Eyes:     Conjunctiva/sclera: Conjunctivae normal.     Pupils: Pupils are equal, round, and reactive to light.  Cardiovascular:     Rate and Rhythm: Normal rate and regular rhythm.     Heart sounds: Normal heart sounds.  Pulmonary:      Effort: Pulmonary effort is normal.     Breath sounds: Normal breath sounds.  Abdominal:     General: Bowel sounds are normal.     Palpations: Abdomen is soft.  Musculoskeletal:        General: No tenderness.     Cervical back: Normal range of motion and neck supple.     Right lower leg: No edema.     Left lower leg: No edema.  Skin:    General: Skin is warm and dry.  Neurological:     Mental Status: He is alert and oriented to person, place, and time.     Gait: Gait abnormal (uses walker).  Psychiatric:        Mood and Affect: Mood normal.     Labs reviewed: Basic Metabolic Panel: Recent Labs    09/03/23 1520 09/06/23 0940 01/28/24 1532  NA 140 141 140  K 4.2 3.7 4.8  CL 106 110 104  CO2 26 23 30   GLUCOSE 113* 101* 98  BUN 25 27* 24  CREATININE 0.93 0.92 0.92  CALCIUM 9.1 8.6* 9.4  TSH  --   --  3.51   Liver Function Tests: Recent Labs    09/03/23 1520 09/06/23 0940 01/28/24 1532  AST 23 27 20   ALT 23 22 18   ALKPHOS  --  69  --   BILITOT 0.7 1.2* 0.6  PROT 6.6 6.2* 6.6  ALBUMIN  --  3.6  --    No results for input(s): LIPASE, AMYLASE in the last 8760 hours. No results for input(s): AMMONIA in the last 8760 hours. CBC: Recent Labs    09/03/23 1520 09/06/23 0940 01/28/24 1532  WBC 7.8 8.0 7.8  NEUTROABS 4,875 5.8 5,101  HGB 16.0 15.1 16.3  HCT 47.9 43.7 48.0  MCV 95.6 95.8 97.6  PLT 202 183 206   Lipid Panel: No results for input(s): CHOL, HDL, LDLCALC, TRIG, CHOLHDL, LDLDIRECT in the last 8760 hours. TSH: Recent Labs    01/28/24 1532  TSH 3.51   A1C: Lab Results  Component Value Date   HGBA1C 5.6 07/11/2022     Assessment/Plan  Need for influenza vaccination -     Flu vaccine HIGH DOSE PF(Fluzone Trivalent)  Assessment and Plan Assessment & Plan Chronic low back pain and chronic leg pain Chronic pain with limited relief from Tylenol . Prefers to avoid injections and PT. - Continue regular strength Tylenol  as  needed. - Offered Cymbalta if Tylenol  insufficient but does not wish to start anything right now - Consider pain management referral for injections if severe. - Encourage physical activity within comfort.  Esophageal stricture Persistent dysphagia despite esophageal dilation. Managed with famotidine . Prilosec discontinued due to hallucinations. - Continue famotidine  20 mg daily. - Advise small bites, thorough chewing, adequate fluid intake.  Edema Well-managed with Lasix . No current swelling. - Continue Lasix  twice daily. - Monitor renal function with periodic blood work.  A fib Rate controlled on metoprolol   Not on anticoagulation   General Health Maintenance Received flu shot, plans for COVID-19 vaccination.    Return in about 6 months (around 12/28/2024) for routine follow up.:   Lafreda Casebeer K. Caro BODILY [facility] [phone]

## 2024-07-01 ENCOUNTER — Ambulatory Visit: Payer: Self-pay | Admitting: Nurse Practitioner

## 2024-10-22 ENCOUNTER — Telehealth: Payer: Self-pay | Admitting: Internal Medicine

## 2024-10-22 ENCOUNTER — Emergency Department (HOSPITAL_COMMUNITY)
Admission: EM | Admit: 2024-10-22 | Discharge: 2024-10-22 | Disposition: A | Discharge status: Home / Self Care | Attending: Emergency Medicine | Admitting: Emergency Medicine

## 2024-10-22 ENCOUNTER — Ambulatory Visit: Payer: Self-pay

## 2024-10-22 ENCOUNTER — Encounter (HOSPITAL_COMMUNITY): Payer: Self-pay

## 2024-10-22 ENCOUNTER — Encounter (HOSPITAL_COMMUNITY)
Admission: EM | Disposition: A | Discharge status: Home / Self Care | Payer: Self-pay | Source: Home / Self Care | Attending: Emergency Medicine

## 2024-10-22 ENCOUNTER — Emergency Department (HOSPITAL_COMMUNITY): Admitting: Anesthesiology

## 2024-10-22 DIAGNOSIS — T18128A Food in esophagus causing other injury, initial encounter: Secondary | ICD-10-CM | POA: Insufficient documentation

## 2024-10-22 DIAGNOSIS — I480 Paroxysmal atrial fibrillation: Secondary | ICD-10-CM | POA: Insufficient documentation

## 2024-10-22 DIAGNOSIS — K2270 Barrett's esophagus without dysplasia: Secondary | ICD-10-CM

## 2024-10-22 DIAGNOSIS — W44F3XA Food entering into or through a natural orifice, initial encounter: Secondary | ICD-10-CM | POA: Diagnosis not present

## 2024-10-22 DIAGNOSIS — K222 Esophageal obstruction: Secondary | ICD-10-CM | POA: Insufficient documentation

## 2024-10-22 DIAGNOSIS — I10 Essential (primary) hypertension: Secondary | ICD-10-CM

## 2024-10-22 DIAGNOSIS — K31A19 Gastric intestinal metaplasia without dysplasia, unspecified site: Secondary | ICD-10-CM | POA: Diagnosis not present

## 2024-10-22 DIAGNOSIS — K2100 Gastro-esophageal reflux disease with esophagitis, without bleeding: Secondary | ICD-10-CM | POA: Diagnosis not present

## 2024-10-22 DIAGNOSIS — K209 Esophagitis, unspecified without bleeding: Secondary | ICD-10-CM | POA: Diagnosis present

## 2024-10-22 LAB — I-STAT CHEM 8, ED
BUN: 32 mg/dL — ABNORMAL HIGH (ref 8–23)
Calcium, Ion: 1.12 mmol/L — ABNORMAL LOW (ref 1.15–1.40)
Chloride: 108 mmol/L (ref 98–111)
Creatinine, Ser: 1 mg/dL (ref 0.61–1.24)
Glucose, Bld: 98 mg/dL (ref 70–99)
HCT: 44 % (ref 39.0–52.0)
Hemoglobin: 15 g/dL (ref 13.0–17.0)
Potassium: 4.9 mmol/L (ref 3.5–5.1)
Sodium: 142 mmol/L (ref 135–145)
TCO2: 23 mmol/L (ref 22–32)

## 2024-10-22 MED ORDER — PROPOFOL 500 MG/50ML IV EMUL
INTRAVENOUS | Status: AC
  Filled 2024-10-22: qty 50

## 2024-10-22 MED ORDER — LACTATED RINGERS IV SOLN: INTRAVENOUS | Status: DC | PRN

## 2024-10-22 MED ORDER — PROPOFOL 10 MG/ML IV BOLUS
INTRAVENOUS | Status: DC | PRN
  Administered 2024-10-22: 150 mg via INTRAVENOUS

## 2024-10-22 MED ORDER — FAMOTIDINE 20 MG PO TABS: 20.0000 mg | ORAL_TABLET | Freq: Two times a day (BID) | ORAL | 1 refills | Status: AC

## 2024-10-22 MED ORDER — SUCCINYLCHOLINE CHLORIDE 200 MG/10ML IV SOSY
PREFILLED_SYRINGE | INTRAVENOUS | Status: DC | PRN
  Administered 2024-10-22: 100 mg via INTRAVENOUS

## 2024-10-22 MED ORDER — PROPOFOL 10 MG/ML IV BOLUS
INTRAVENOUS | Status: AC
  Filled 2024-10-22: qty 20

## 2024-10-22 MED ORDER — DEXAMETHASONE SOD PHOSPHATE PF 10 MG/ML IJ SOLN
INTRAMUSCULAR | Status: DC | PRN
  Administered 2024-10-22: 8 mg via INTRAVENOUS

## 2024-10-22 MED ORDER — LIDOCAINE HCL (CARDIAC) PF 100 MG/5ML IV SOSY
PREFILLED_SYRINGE | INTRAVENOUS | Status: DC | PRN
  Administered 2024-10-22: 90 mg via INTRAVENOUS

## 2024-10-22 MED ORDER — ALBUTEROL SULFATE HFA 108 (90 BASE) MCG/ACT IN AERS
INHALATION_SPRAY | RESPIRATORY_TRACT | Status: AC
  Filled 2024-10-22: qty 6.7

## 2024-10-22 MED ORDER — AMOXICILLIN-POT CLAVULANATE 875-125 MG PO TABS: 1.0000 | ORAL_TABLET | Freq: Two times a day (BID) | ORAL | 0 refills | Status: DC

## 2024-10-22 MED ORDER — AMOXICILLIN-POT CLAVULANATE 875-125 MG PO TABS: 1.0000 | ORAL_TABLET | Freq: Two times a day (BID) | ORAL | 0 refills | Status: AC

## 2024-10-22 MED ORDER — FAMOTIDINE 20 MG PO TABS: 20.0000 mg | ORAL_TABLET | Freq: Two times a day (BID) | ORAL | 1 refills | Status: DC

## 2024-10-22 MED ORDER — ONDANSETRON HCL 4 MG/2ML IJ SOLN
INTRAMUSCULAR | Status: DC | PRN
  Administered 2024-10-22: 4 mg via INTRAVENOUS

## 2024-10-22 NOTE — ED Provider Notes (Signed)
 " [place] [facility] AT [facility] Provider Note   CSN: [id number] Arrival date & time: 10/22/24  1132     Patient presents with: food bolus   Glen [name] is a 89 y.o. male.   HPI 34 male history of esophageal strictures presents today complaining of food impaction.  He states that he has been unable to swallow anything down since he ate chicken yesterday for lunch.  He reports several attempts to drink water but came back up.  Today he tried to have cereal did not go down to try to drink the milk.  The milk also would not go down.  He denies any significant chest pain, dyspnea, coughing, nausea, fever, or diarrhea.  He has been seen by Dr. Albertus for esophageal dilation in the past.  His daughter-in-law was present at the bedside.  She reports that they did call the office today and were told to proceed to the ED.  He has a history of paroxysmal atrial fibrillation but is not taking any blood thinner medication.  Prior to Admission medications  Medication Sig Start Date End Date Taking? Authorizing Provider  acetaminophen  (TYLENOL ) 500 MG tablet Take 1,500 mg by mouth at bedtime. And 2 in the morning    [provider]  famotidine  (PEPCID ) 20 MG tablet Take 20 mg by mouth daily.    [provider]  furosemide  (LASIX ) 20 MG tablet Take 1 tablet by mouth twice daily 06/16/24   Lonni Slain, MD  loratadine (CLARITIN) 10 MG tablet Take 10 mg by mouth daily.    [provider]  METAMUCIL FIBER PO Take 3 tablets by mouth in the morning and at bedtime.    [provider]  metoprolol  tartrate (LOPRESSOR ) 100 MG tablet Take 1 tablet by mouth twice daily 06/16/24   Lonni Slain, MD  Multiple Vitamin (MULTIVITAMIN) tablet Take 1 tablet by mouth daily.    [provider]    Allergies: Omeprazole , Protonix  [pantoprazole ], Codeine, and Ferrous sulfate     Review of Systems  Updated Vital Signs BP 122/75    Pulse 91   Temp (!) 97.4 F (36.3 C) (Temporal)   Resp 19   Ht 1.829 m (6')   Wt 90.7 kg   SpO2 96%   BMI 27.12 kg/m   Physical Exam Vitals and nursing note reviewed.  Constitutional:      Appearance: Normal appearance.  HENT:     Head: Normocephalic.     Right Ear: External ear normal.     Left Ear: External ear normal.     Nose: Nose normal.     Mouth/Throat:     Pharynx: Oropharynx is clear.  Eyes:     Pupils: Pupils are equal, round, and reactive to light.  Cardiovascular:     Rate and Rhythm: Normal rate.     Pulses: Normal pulses.  Pulmonary:     Effort: Pulmonary effort is normal.     Breath sounds: Normal breath sounds.  Abdominal:     General: Abdomen is flat.     Palpations: Abdomen is soft.  Musculoskeletal:        General: Normal range of motion.     Cervical back: Normal range of motion.  Skin:    General: Skin is warm.     Capillary Refill: Capillary refill takes less than 2 seconds.  Neurological:     General: No focal deficit present.     Mental Status: He is alert.  Psychiatric:  Mood and Affect: Mood normal.     (all labs ordered are listed, but only abnormal results are displayed) Labs Reviewed  I-STAT CHEM 8, ED - Abnormal; Notable for the following components:      Result Value   BUN 32 (*)    Calcium, Ion 1.12 (*)    All other components within normal limits    EKG: None  Radiology: No results found.   Procedures   Medications Ordered in the ED - No data to display                                  Medical Decision Making  Patient seen and evaluated here in the ED for ability to swallow foods.  Differential diagnosis includes but is not limited to esophageal impaction, esophagitis, other diseases of the chest and upper abdomen based on history and physical exam most consistent with esophageal impaction.  Patient would be best differentiated via endoscopy Patient's chart reviewed and patient has had esophageal  dilatation by Dr. Albertus in the past. I-STAT is ordered.  EKG is ordered due to some irregularity and pulse. Care was discussed with Delon Failing, PA, on call for [facility]. GI saw patient and took to endoscopy. I have reviewed chart i-STAT essentially normal, EKG was not completed prior to patient leaving department     Final diagnoses:  Esophageal obstruction    ED Discharge Orders     None          Levander Houston, MD 10/22/24 1549  "

## 2024-10-22 NOTE — Consult Note (Addendum)
 "    Consultation  Referring Provider:   Dr. Levander   Primary Care Physician:  Caro Harlene POUR, NP Primary Gastroenterologist: Dr. Albertus        Reason for Consultation: Food impaction             HPI:   Glen [name] is a 89 y.o. male with a past medical history as listed below including A-fib and multiple prior esophageal strictures and food impactions previously dilated, who presented to the ED with concerns for food impaction.    Patient's daughter presented to the office earlier today requesting to speak with nursing, discussed that her dad had a history of esophageal stricture with repeat dilations needed.  He does not chew his food well and often eats large bites.  He had been having increasing difficulty with dysphagia and yesterday had some chicken which she had to vomit up.  Not been able to get anything down since yesterday.  Advised to go to the ED.     Today, patient presents to ER today with daughter and they explained that yesterday around 1:00 in the afternoon patient had a skinless chicken thigh and after that nothing else would go in.  Patient tells me anytime he takes a sip of water it comes right back out.  He was able to get a couple of pills in this morning though, Aleve and possibly his metoprolol .  Tells me he is no longer on an acid reducing medication because it caused him to hallucinate (per allergies).  No abdominal pain or change in bowel habits.  Per daughter's eating was getting worse, apparently he has lost some teeth and has a hard time chewing and does not slow down.  She tells me she is putting him on a ground meat only diet after this.    Patient no longer on a blood thinner as he stopped it on his own.    Denies fever, chills or weight loss.  ED course: I-STAT Chem-8 with a BUN of 32, calcium 1.12 and otherwise normal  Most recent GI history: 11/07/2023 EGD for therapy of esophageal stenosis seen at recent EGD for food impaction (10/31/2023) with a  benign-appearing esophageal stenosis dilated to 16.5 mm with a balloon and small hiatal hernia, otherwise normal to the duodenum.  Discussed Omeprazole  40 mg daily to reduce GERD and reduce risk of Rie stricturing.  Repeat EGD as needed for retreatment. 6 prior EGDs in chart 10/31/2023 office visit with me at that time suspected food impaction.  Told to proceed to the ED.  Past Medical History:  Diagnosis Date   Atrial fibrillation [facility])    Atrial fibrillation, chronic (HCC) 03/13/2008   Qualifier: Diagnosis of  By: Jame  MD, Maude FALCON    Diverticulitis    Esophageal dysmotility    Esophageal dysphagia 07/29/2018   Family history of diabetes insipidus    FHx: colon cancer    H. pylori infection    Hiatal hernia    History of colonic polyps    History of GI bleed 10/04/2022   Hyperlipidemia    Hypertension    IDA (iron deficiency anemia)    Obesity    Palpitation    Peptic ulcer disease    Presbyesophagus    Schatzki's ring     Past Surgical History:  Procedure Laterality Date   2-D Echocardiogram  November 2009, 2011   Anal Fistula Repair     BALLOON DILATION N/A 01/17/2022   Procedure: BALLOON DILATION;  Surgeon: Charlanne Groom, MD;  Location: [facility];  Service: Gastroenterology;  Laterality: N/A;   BIOPSY  07/14/2020   Procedure: BIOPSY;  Surgeon: Albertus Gordy HERO, MD;  Location: [facility];  Service: Gastroenterology;;   BIOPSY  01/17/2022   Procedure: BIOPSY;  Surgeon: Charlanne Groom, MD;  Location: [facility];  Service: Gastroenterology;;   CARDIAC VALVE SURGERY  1996   status post balloon valvuloplasty at [facility]   CATARACT EXTRACTION  2004   COLONOSCOPY  December 2005   ESOPHAGOGASTRODUODENOSCOPY  07/14/2020   ESOPHAGOGASTRODUODENOSCOPY N/A 01/17/2022   Procedure: ESOPHAGOGASTRODUODENOSCOPY (EGD);  Surgeon: Charlanne Groom, MD;  Location: [facility];  Service: Gastroenterology;  Laterality: N/A;   ESOPHAGOGASTRODUODENOSCOPY N/A 10/31/2023   Procedure:  ESOPHAGOGASTRODUODENOSCOPY (EGD);  Surgeon: Abran Norleen SAILOR, MD;  Location: [facility];  Service: Gastroenterology;  Laterality: N/A;   ESOPHAGOGASTRODUODENOSCOPY (EGD) WITH PROPOFOL  N/A 07/14/2020   Procedure: ESOPHAGOGASTRODUODENOSCOPY (EGD) WITH PROPOFOL ;  Surgeon: Albertus Gordy HERO, MD;  Location: [facility] ENDOSCOPY;  Service: Gastroenterology;  Laterality: N/A;   ETT  1991   Negative   FOREIGN BODY REMOVAL  10/31/2023   Procedure: FOREIGN BODY REMOVAL;  Surgeon: Abran Norleen SAILOR, MD;  Location: [facility];  Service: Gastroenterology;;   HEMOSTASIS CLIP PLACEMENT  07/14/2020   HEMOSTASIS CLIP PLACEMENT   HEMOSTASIS CLIP PLACEMENT  07/14/2020   Procedure: HEMOSTASIS CLIP PLACEMENT;  Surgeon: Albertus Gordy HERO, MD;  Location: [facility];  Service: Gastroenterology;;   KNEE ARTHROSCOPY     Right   TONSILLECTOMY      Family History  Problem Relation Age of Onset   Cancer Mother    Heart Problems Father    Stroke Father    Heart attack Father    Breast cancer Sister    Stroke Brother    Heart Problems Brother    Esophageal cancer Neg Hx    Colon cancer Neg Hx    Stomach cancer Neg Hx    Rectal cancer Neg Hx      Social History Tobacco Use   Smoking status: Never    Passive exposure: Past   Smokeless tobacco: Never  Vaping Use   Vaping status: Never Used  Substance Use Topics   Alcohol use: Never   Drug use: Never    Prior to Admission medications  Medication Sig Start Date End Date Taking? Authorizing Provider  acetaminophen  (TYLENOL ) 500 MG tablet Take 1,500 mg by mouth at bedtime. And 2 in the morning    [provider]  famotidine  (PEPCID ) 20 MG tablet Take 20 mg by mouth daily.    [provider]  furosemide  (LASIX ) 20 MG tablet Take 1 tablet by mouth twice daily 06/16/24   Lonni Slain, MD  loratadine (CLARITIN) 10 MG tablet Take 10 mg by mouth daily.    [provider]  METAMUCIL FIBER PO Take 3 tablets by mouth in the morning and at  bedtime.    [provider]  metoprolol  tartrate (LOPRESSOR ) 100 MG tablet Take 1 tablet by mouth twice daily 06/16/24   Lonni Slain, MD  Multiple Vitamin (MULTIVITAMIN) tablet Take 1 tablet by mouth daily.    [provider]    No current facility-administered medications for this encounter.   Current Outpatient Medications  Medication Sig Dispense Refill   acetaminophen  (TYLENOL ) 500 MG tablet Take 1,500 mg by mouth at bedtime. And 2 in the morning     famotidine  (PEPCID ) 20 MG tablet Take 20 mg by mouth daily.     furosemide  (LASIX ) 20 MG  tablet Take 1 tablet by mouth twice daily 180 tablet 3   loratadine (CLARITIN) 10 MG tablet Take 10 mg by mouth daily.     METAMUCIL FIBER PO Take 3 tablets by mouth in the morning and at bedtime.     metoprolol  tartrate (LOPRESSOR ) 100 MG tablet Take 1 tablet by mouth twice daily 180 tablet 3   Multiple Vitamin (MULTIVITAMIN) tablet Take 1 tablet by mouth daily.      Allergies as of 10/22/2024 - Review Complete 10/22/2024  Allergen Reaction Noted   Omeprazole  Other (See Comments) 11/28/2023   Protonix  [pantoprazole ] Other (See Comments) 08/09/2020   Codeine Other (See Comments) 07/31/2007   Ferrous sulfate  Itching and Swelling 09/14/2020     Review of Systems:    Constitutional: No weight loss, fever or chills Skin: No rash  Cardiovascular: No chest pain Respiratory: No SOB Gastrointestinal: See HPI and otherwise negative Genitourinary: No dysuria  Neurological: No headache, dizziness or syncope Musculoskeletal: No new muscle or joint pain Hematologic: No bleeding  Psychiatric: No history of depression or anxiety    Physical Exam:  Vital signs in last 24 hours: Temp:  [97.4 F (36.3 C)] 97.4 F (36.3 C) (01/21 1137) Pulse Rate:  [80] 80 (01/21 1137) Resp:  [16] 16 (01/21 1137) BP: (109)/(76) 109/76 (01/21 1137) SpO2:  [100 %] 100 % (01/21 1137)   General:   Pleasant elderly Caucasian male appears to be  in NAD, Well developed, Well nourished, alert and cooperative Head:  Normocephalic and atraumatic. Eyes:   PEERL, EOMI. No icterus. Conjunctiva pink. Ears:  Normal auditory acuity. Neck:  Supple Throat: Oral cavity and pharynx without inflammation, swelling or lesion. Poor dentition Lungs: Respirations even and unlabored. Lungs clear to auscultation bilaterally.   No wheezes, crackles, or rhonchi.  Heart: Normal S1, S2. No MRG. Regular rate and rhythm. No peripheral edema, cyanosis or pallor.  Abdomen:  Soft, nondistended, nontender. No rebound or guarding. Normal bowel sounds. No appreciable masses or hepatomegaly. Rectal:  Not performed.  Msk:  Symmetrical without gross deformities. Peripheral pulses intact.  Extremities:  Without edema, no deformity or joint abnormality. Normal ROM, normal sensation. Neurologic:  Alert and  oriented x4;  grossly normal neurologically. CN II-XII intact.  Skin:   Dry and intact without significant lesions or rashes. Psychiatric: Demonstrates good judgement and reason without abnormal affect or behaviors.  LAB RESULTS: See HPI   Impression / Plan:   Impression: 1.  Food impaction: With known history of esophageal stenosis and previous food impactions, last dilated to 16 mm about a year ago, no longer maintained on Omeprazole  given history of hallucinations per him, on a generic acid reducer from [employer] per his daughter, patient ate a chicken thigh yesterday around 1:00 and since then has been unable to hold down liquids, did get done a couple of pills today including Aleve  Plan: 1.  EGD today for removal of food impaction.  Will likely require repeat EGD in the future for dilation of stenosis.  Did discuss risk and benefits connotations and alternatives and patient agrees to proceed. 2.  Please await further recommendations after time of EGD.  Thank you for your kind consultation.  Delon Gibson Lemmon  10/22/2024, 1:32 PM   Attending physician's  note  I personally saw the patient and performed a substantive portion of the medical decision making process for this encounter (including a complete performance of the key components : MDM, Hx and Exam), in conjunction with the APP.  I  agree with the APP's note, impression, and  the management plan for the number and complexity of problems addressed at the encounter for the patient and take responsibility for that plan with its inherent risk of complications, morbidity, or mortality with additional input as follows.    89 year old very pleasant gentleman with history of esophageal stricture, recurrent episode of food impaction  Will plan to proceed with EGD for disimpaction The risks and benefits as well as alternatives of endoscopic procedure(s) have been discussed and reviewed. All questions answered. The patient agrees to proceed.    The patient was provided an opportunity to ask questions and all were answered. The patient agreed with the plan and demonstrated an understanding of the instructions.  LOIS Wilkie Mcgee , MD [phone]     "

## 2024-10-22 NOTE — Telephone Encounter (Signed)
 Thank you for the update!

## 2024-10-22 NOTE — Telephone Encounter (Signed)
 Incoming call from pts daughter regarding scheduling appointment for trouble swallowing and eating. Pt scheduled for 11/11/2024 but daughter requesting sooner appointment. Daughter also requesting medial advice if possible.

## 2024-10-22 NOTE — Telephone Encounter (Signed)
 He will need an evaluation. If he is not able to get or drink anything needs to go to the ED for evaluation.

## 2024-10-22 NOTE — Telephone Encounter (Signed)
 FYI Only or Action Required?: FYI only for provider: ED advised.  Patient was last seen in primary care on 06/30/2024 by Glen Harlene POUR, NP.  Called Nurse Triage reporting Vomiting.  Symptoms began yesterday.  Interventions attempted: Dietary changes.  Symptoms are: unchanged.  Triage Disposition: Go to ED Now (or PCP Triage)  Patient/caregiver understands and will follow disposition?: No, wishes to speak with PCP   Reason for Disposition  [1] MODERATE to SEVERE vomiting (e.g., 3 or more times/day) AND [2] age > 60 years  Answer Assessment - Initial Assessment Questions Spoke with patient's daughter, Kathi, who states patient started vomiting after lunch yesterday. She states that he seems to be vomiting liquid or whatever he last ate. She states that this happens for the patient every couple of years and he usually needs to have his esophagus stretched. He has not been able to eat or drink anything since yesterday afternoon. ED advised, states patient does not do well waiting at the ED. She mentions that previously GI doctor has called ahead to ED to have this procedure done-she has reached out to GI and they informed her that nurse would get back to her within 24-48 hours. She would like to know if PCP is able to call ahead to ED or call Gastroenterologist so he can have procedure done.   1. VOMITING SEVERITY: How many times have you vomited in the past 24 hours?      Every time he eats or drinks anything  2. ONSET: When did the vomiting begin?      Yesterday afternoon  3. FLUIDS: What fluids or food have you vomited up today? Have you been able to keep any fluids down?     None, has not eaten or drunken anything   4. ABDOMEN PAIN: Are your having any abdomen pain? If Yes : How bad is it and what does it feel like? (e.g., crampy, dull, intermittent, constant)      No  5. DIARRHEA: Is there any diarrhea? If Yes, ask: How many times today?      No  6. CONTACTS:  Is there anyone else in the family with the same symptoms?      No  7. CAUSE: What do you think is causing your vomiting?     Esophageal stricture  8. HYDRATION STATUS: Any signs of dehydration? (e.g., dry mouth [not only dry lips], too weak to stand) When did you last urinate?     Unknown  9. OTHER SYMPTOMS: Do you have any other symptoms? (e.g., fever, headache, vertigo, vomiting blood or coffee grounds, recent head injury)     No  10. PREGNANCY: Is there any chance you are pregnant? When was your last menstrual period?       NA  Protocols used: Vomiting-A-AH  .Reason for Triage:Patient daughter stated pt has having problems, everything that he eats or drinks comes right back up, has had his esophagus several times but its flaring

## 2024-10-22 NOTE — Anesthesia Procedure Notes (Signed)
 Procedure Name: Intubation Date/Time: 10/22/2024 2:32 PM  Performed by: Dartha Meckel, CRNAPre-anesthesia Checklist: Patient identified, Emergency Drugs available, Suction available and Patient being monitored Patient Re-evaluated:Patient Re-evaluated prior to induction Oxygen Delivery Method: Circle system utilized Preoxygenation: Pre-oxygenation with 100% oxygen Induction Type: IV induction Ventilation: Mask ventilation without difficulty Laryngoscope Size: Mac and 4 Tube type: Oral Tube size: 7.5 mm Number of attempts: 1 Airway Equipment and Method: Stylet and Oral airway Placement Confirmation: ETT inserted through vocal cords under direct vision, positive ETCO2 and breath sounds checked- equal and bilateral Secured at: 22 cm Tube secured with: Tape Dental Injury: Teeth and Oropharynx as per pre-operative assessment

## 2024-10-22 NOTE — Telephone Encounter (Signed)
 Left message for pt's daughter to call back.

## 2024-10-22 NOTE — Op Note (Signed)
 [facility] Patient Name: Glen [name] Procedure Date: 10/22/2024 MRN: [id number] Attending MD: Gustav ALONSO Mcgee , MD, [id number] Date of Birth: Feb 27, 1928 CSN: [id number] Age: 89 Admit Type: Outpatient Procedure:                Upper GI endoscopy Indications:              Foreign body in the esophagus Providers:                Gustav ALONSO Mcgee, MD, Darleene Bare, RN, Haskel Chris, Technician Referring MD:              Medicines:                General Anesthesia Complications:            No immediate complications. Estimated Blood Loss:     Estimated blood loss was minimal. Procedure:                Pre-Anesthesia Assessment:                           - Prior to the procedure, a History and Physical                            was performed, and patient medications and                            allergies were reviewed. The patient's tolerance of                            previous anesthesia was also reviewed. The risks                            and benefits of the procedure and the sedation                            options and risks were discussed with the patient.                            All questions were answered, and informed consent                            was obtained. Prior Anticoagulants: The patient has                            taken no anticoagulant or antiplatelet agents. ASA                            Grade Assessment: III - A patient with severe                            systemic disease. After reviewing the risks and  benefits, the patient was deemed in satisfactory                            condition to undergo the procedure.                           After obtaining informed consent, the endoscope was                            passed under direct vision. Throughout the                            procedure, the patient's blood pressure, pulse, and                             oxygen saturations were monitored continuously. The                            [id number]-H190 ([id number] Olympus endoscope was introduced                            through the mouth, and advanced to the second part                            of duodenum. The upper GI endoscopy was performed                            with difficulty due to presence of food. The                            patient tolerated the procedure well. Scope In: Scope Out: Findings:      Food/chicken was found compacted in the entire esophagus. Removal was       accomplished with a banding device, talon forceps, Raptor grasping       device and Roth net.      One moderate stenosis with mucosal abnormality/nodularity was found 37       to 38 cm from the incisors. This stenosis measured 1.5 cm (inner       diameter) x 1 cm (in length). The stenosis was traversed. Biopsies were       taken with a cold forceps for histology.      The stomach was normal.      The cardia and gastric fundus were normal on retroflexion.      The examined duodenum was normal. Impression:               - Food in the esophagus. Removal was successful.                           - Esophageal stenosis. Biopsied.                           - Normal stomach.                           - Normal examined duodenum. Moderate Sedation:  N/A Recommendation:           - Pureed diet indefinitely.                           - Augmentin  875mg  BID X 7 days                           - Use Pepcid  (famotidine ) 20 mg PO BID.                           - OK to DC home from GI standpoint, return to ER                            for further management Procedure Code(s):        --- Professional ---                           [phone], Esophagogastroduodenoscopy, flexible,                            transoral; with removal of foreign body(s)                           43239, Esophagogastroduodenoscopy, flexible,                            transoral; with biopsy, single or  multiple Diagnosis Code(s):        --- Professional ---                           U81.871J, Food in esophagus causing other injury,                            initial encounter                           K22.2, Esophageal obstruction                           T18.108A, Unspecified foreign body in esophagus                            causing other injury, initial encounter CPT copyright 2022 American Medical Association. All rights reserved. The codes documented in this report are preliminary and upon coder review may  be revised to meet current compliance requirements. [name] [name], MD 10/22/2024 4:49:55 PM This report has been signed electronically. Number of Addenda: 0

## 2024-10-22 NOTE — Anesthesia Preprocedure Evaluation (Signed)
"                                    Anesthesia Evaluation  Patient identified by MRN, date of birth, ID band Patient awake    Reviewed: Allergy & Precautions, H&P , NPO status , Patient's Chart, lab work & pertinent test results  Airway Mallampati: III   Neck ROM: full    Dental   Pulmonary neg pulmonary ROS   breath sounds clear to auscultation       Cardiovascular hypertension, + dysrhythmias Atrial Fibrillation  Rhythm:regular Rate:Normal     Neuro/Psych    GI/Hepatic hiatal hernia, PUD,,,  Endo/Other    Renal/GU      Musculoskeletal  (+) Arthritis ,    Abdominal   Peds  Hematology   Anesthesia Other Findings   Reproductive/Obstetrics                              Anesthesia Physical Anesthesia Plan  ASA: 3  Anesthesia Plan: General   Post-op Pain Management:    Induction: Intravenous  PONV Risk Score and Plan: 2 and Ondansetron , Dexamethasone  and Treatment may vary due to age or medical condition  Airway Management Planned: Oral ETT  Additional Equipment:   Intra-op Plan:   Post-operative Plan: Extubation in OR  Informed Consent: I have reviewed the patients History and Physical, chart, labs and discussed the procedure including the risks, benefits and alternatives for the proposed anesthesia with the patient or authorized representative who has indicated his/her understanding and acceptance.     Dental advisory given  Plan Discussed with: CRNA, Anesthesiologist and Surgeon  Anesthesia Plan Comments:         Anesthesia Quick Evaluation  "

## 2024-10-22 NOTE — ED Triage Notes (Signed)
 Patient reports food bolus/stricture, able to swallow but everything is coming back up since yesterday, has had multiple esophageal stretches. Patient is alert and oriented x 4. Airway patent, respirations even and unlabored. Skin normal, warm and dry. Denies fevers/pain.

## 2024-10-22 NOTE — ED Notes (Signed)
 Pt was able to eat and drink without issue

## 2024-10-22 NOTE — Telephone Encounter (Signed)
 Spoke with patient wife Lionel splinter stated her daughter n law was on the way to take patient to the doctor.

## 2024-10-22 NOTE — ED Provider Notes (Signed)
" °  Physical Exam  BP 116/70   Pulse 96   Temp 97.6 F (36.4 C)   Resp 14   Ht 6' (1.829 m)   Wt 90.7 kg   SpO2 96%   BMI 27.12 kg/m   Physical Exam Vitals and nursing note reviewed.  Constitutional:      General: He is not in acute distress.    Appearance: He is well-developed.  HENT:     Head: Normocephalic and atraumatic.  Eyes:     Conjunctiva/sclera: Conjunctivae normal.  Cardiovascular:     Rate and Rhythm: Normal rate and regular rhythm.     Heart sounds: No murmur heard. Pulmonary:     Effort: Pulmonary effort is normal. No respiratory distress.     Breath sounds: Normal breath sounds.  Abdominal:     Palpations: Abdomen is soft.     Tenderness: There is no abdominal tenderness.  Musculoskeletal:        General: No swelling.     Cervical back: Neck supple.  Skin:    General: Skin is warm and dry.     Capillary Refill: Capillary refill takes less than 2 seconds.  Neurological:     Mental Status: He is alert.  Psychiatric:        Mood and Affect: Mood normal.     Procedures  Procedures  ED Course / MDM    Medical Decision Making  Status post EGD for food bolus.  Toller procedure well.  Tolerating p.o. this point time.  Cleared for discharge home.  Patient will follow-up with his GI doctor       Simon Lavonia SAILOR, MD 10/22/24 2005  "

## 2024-10-22 NOTE — Anesthesia Postprocedure Evaluation (Signed)
"   Anesthesia Post Note  Patient: Lynwood BRAVO Dahm  Procedure(s) Performed: EGD (ESOPHAGOGASTRODUODENOSCOPY) FOREIGN BODY RETRIEVAL BIOPSY, SKIN, SUBCUTANEOUS TISSUE, OR MUCOUS MEMBRANE     Patient location during evaluation: PACU Anesthesia Type: General Level of consciousness: awake and alert, oriented and patient cooperative Pain management: pain level controlled Vital Signs Assessment: post-procedure vital signs reviewed and stable Respiratory status: spontaneous breathing, nonlabored ventilation and respiratory function stable Cardiovascular status: blood pressure returned to baseline and stable Postop Assessment: no apparent nausea or vomiting Anesthetic complications: no   No notable events documented.  Last Vitals:  Vitals:   10/22/24 1655 10/22/24 1700  BP:  (!) 143/103  Pulse: 95 90  Resp: (!) 22 18  Temp:    SpO2: 99% 97%    Last Pain:  Vitals:   10/22/24 1650  TempSrc:   PainSc: Asleep                 Verla Bryngelson,E. Jahni Nazar      "

## 2024-10-22 NOTE — Transfer of Care (Signed)
 Immediate Anesthesia Transfer of Care Note  Patient: Glen [name]  Procedure(s) Performed: EGD (ESOPHAGOGASTRODUODENOSCOPY) FOREIGN BODY RETRIEVAL BIOPSY, SKIN, SUBCUTANEOUS TISSUE, OR MUCOUS MEMBRANE  Patient Location: PACU  Anesthesia Type:General  Level of Consciousness: awake and alert   Airway & Oxygen Therapy: Patient Spontanous Breathing and Patient connected to nasal cannula oxygen  Post-op Assessment: Report given to RN and Post -op Vital signs reviewed and stable  Post vital signs: Reviewed and stable  Last Vitals:  Vitals Value Taken Time  BP 146/108 10/22/24 16:50  Temp    Pulse 94 10/22/24 16:56  Resp 23 10/22/24 16:56  SpO2 97 % 10/22/24 16:56  Vitals shown include unfiled device data.  Last Pain:  Vitals:   10/22/24 1650  TempSrc:   PainSc: Asleep         Complications: No notable events documented.

## 2024-10-22 NOTE — Telephone Encounter (Signed)
 Patient's daughter presented to the office today requesting to speak with nursing. She states that patient has history of esophageal stricture with repeat dilations needed. States that patient does not chew his food well and often eats large bites though he indicates he does chew his food well. States that patient has had an increasingly difficult time with dysphagia recently and yesterday, had some chicken which he had to vomit up. Has not had any PO intake since yesterday without vomiting.   Daughter is advised that patient should present to the emergency room for immediate evaluation/treatment as we are unable to perform any emergent procedures or provide IV hydration etc if needed.  Daughter states that last time he was seen, Delon Charlena, [facility]) called the ED and told them patient needed an endoscopy and this significantly reduced their wait time. She requests that we do this again.  I have advised that while I can notify our GI inpatient team that patient is going to the ER, however, since he has not been evaluated at our office within 24 hours, he will have to be evaluated as usual by the ER physician and a GI consult would need to be placed. This is not likely to reduce ER wait time just due to the flu season etc, but he does need evaluation. She verbalizes understanding.

## 2024-10-22 NOTE — Discharge Instructions (Signed)
 Please follow-up with your GI doctor.  If you have any, chest pain or difficulty swallowing come back to the ED

## 2024-10-23 ENCOUNTER — Telehealth: Payer: Self-pay | Admitting: Internal Medicine

## 2024-10-23 DIAGNOSIS — T18128A Food in esophagus causing other injury, initial encounter: Secondary | ICD-10-CM

## 2024-10-23 NOTE — Telephone Encounter (Signed)
 Inbound call from patient daughter stating that he would like to speak to a nutritionist so he can discuss what he is allowed to eat and what he is not allowed to eat. Patient daughter is requesting a call back asap at [phone]. Please advise.

## 2024-10-23 NOTE — Telephone Encounter (Signed)
 Dr Albertus-  Please advise. Pt seen in ED yesterday for food impaction and appears he should be on pureed foods indefinitely.   Daughter is calling requesting nutritionist consult to help figure out what he can eat.

## 2024-10-24 NOTE — Telephone Encounter (Signed)
 I have spoken to patient's daughter in law to advise of Dr Pamula recommendation. She states they were really just trying to find out what foods he is not to eat because he was told not to have tomatoes and now wont eat anything at all with tomato.  Discussed that he should remain on soft foods and pureed meats. Discussed that he should avoid spicy, citrus foods, mint, chocolate, caffeine etc that increase reflux episodes and therefore cause more esophageal strictures. She verbalizes understanding.

## 2024-10-24 NOTE — Telephone Encounter (Signed)
 Dottie, Honestly I am not sure if nutrition will see him to discuss a pured diet, however if they will I am certainly happy to make a referral Really he just needs to eat extremely soft foods and if he eats meats they need to be blended Very high risk for recurrent food impaction Biopsies pending from [facility] and if benign we could schedule outpatient EGD for dilation thereafter

## 2024-10-25 ENCOUNTER — Encounter (HOSPITAL_COMMUNITY): Payer: Self-pay | Admitting: Gastroenterology

## 2024-10-25 LAB — SURGICAL PATHOLOGY

## 2024-11-06 ENCOUNTER — Emergency Department (HOSPITAL_COMMUNITY)

## 2024-11-06 ENCOUNTER — Inpatient Hospital Stay (HOSPITAL_COMMUNITY)
Admission: EM | Admit: 2024-11-06 | Source: Home / Self Care | Attending: Internal Medicine | Admitting: Internal Medicine

## 2024-11-06 ENCOUNTER — Other Ambulatory Visit: Payer: Self-pay

## 2024-11-06 DIAGNOSIS — I10 Essential (primary) hypertension: Secondary | ICD-10-CM | POA: Diagnosis present

## 2024-11-06 DIAGNOSIS — I2699 Other pulmonary embolism without acute cor pulmonale: Secondary | ICD-10-CM | POA: Diagnosis present

## 2024-11-06 DIAGNOSIS — E785 Hyperlipidemia, unspecified: Secondary | ICD-10-CM | POA: Diagnosis present

## 2024-11-06 DIAGNOSIS — R600 Localized edema: Secondary | ICD-10-CM | POA: Diagnosis present

## 2024-11-06 DIAGNOSIS — I2694 Multiple subsegmental pulmonary emboli without acute cor pulmonale: Principal | ICD-10-CM

## 2024-11-06 LAB — CBC
HCT: 46.5 % (ref 39.0–52.0)
Hemoglobin: 15.5 g/dL (ref 13.0–17.0)
MCH: 33.8 pg (ref 26.0–34.0)
MCHC: 33.3 g/dL (ref 30.0–36.0)
MCV: 101.5 fL — ABNORMAL HIGH (ref 80.0–100.0)
Platelets: 228 10*3/uL (ref 150–400)
RBC: 4.58 MIL/uL (ref 4.22–5.81)
RDW: 12.6 % (ref 11.5–15.5)
WBC: 9.2 10*3/uL (ref 4.0–10.5)
nRBC: 0 % (ref 0.0–0.2)

## 2024-11-06 LAB — URINALYSIS, ROUTINE W REFLEX MICROSCOPIC
Bacteria, UA: NONE SEEN
Bilirubin Urine: NEGATIVE
Glucose, UA: NEGATIVE mg/dL
Ketones, ur: NEGATIVE mg/dL
Leukocytes,Ua: NEGATIVE
Nitrite: NEGATIVE
Protein, ur: NEGATIVE mg/dL
RBC / HPF: >50 RBC/hpf (ref 0–5)
Specific Gravity, Urine: 1.041 — ABNORMAL HIGH (ref 1.005–1.030)
pH: 5 (ref 5.0–8.0)

## 2024-11-06 LAB — HEPATIC FUNCTION PANEL
ALT: 17 U/L (ref 0–44)
AST: 26 U/L (ref 15–41)
Albumin: 3.8 g/dL (ref 3.5–5.0)
Alkaline Phosphatase: 91 U/L (ref 38–126)
Bilirubin, Direct: 0.2 mg/dL (ref 0.0–0.2)
Indirect Bilirubin: 0.3 mg/dL (ref 0.3–0.9)
Total Bilirubin: 0.5 mg/dL (ref 0.0–1.2)
Total Protein: 6.4 g/dL — ABNORMAL LOW (ref 6.5–8.1)

## 2024-11-06 LAB — I-STAT CG4 LACTIC ACID, ED: Lactic Acid, Venous: 1.8 mmol/L (ref 0.5–1.9)

## 2024-11-06 LAB — BASIC METABOLIC PANEL WITH GFR
Anion gap: 10 (ref 5–15)
BUN: 22 mg/dL (ref 8–23)
CO2: 24 mmol/L (ref 22–32)
Calcium: 9.3 mg/dL (ref 8.9–10.3)
Chloride: 105 mmol/L (ref 98–111)
Creatinine, Ser: 0.84 mg/dL (ref 0.61–1.24)
GFR, Estimated: >60 mL/min
Glucose, Bld: 130 mg/dL — ABNORMAL HIGH (ref 70–99)
Potassium: 4.5 mmol/L (ref 3.5–5.1)
Sodium: 139 mmol/L (ref 135–145)

## 2024-11-06 LAB — PRO BRAIN NATRIURETIC PEPTIDE: Pro Brain Natriuretic Peptide: 884 pg/mL — ABNORMAL HIGH

## 2024-11-06 LAB — CBG MONITORING, ED: Glucose-Capillary: 102 mg/dL — ABNORMAL HIGH (ref 70–99)

## 2024-11-06 LAB — TROPONIN T, HIGH SENSITIVITY
Troponin T High Sensitivity: 39 ng/L — ABNORMAL HIGH (ref 0–19)
Troponin T High Sensitivity: 29 ng/L — ABNORMAL HIGH (ref 0–19)

## 2024-11-06 LAB — RESP PANEL BY RT-PCR (RSV, FLU A&B, COVID)  RVPGX2
Influenza A by PCR: NEGATIVE
Influenza B by PCR: NEGATIVE
Resp Syncytial Virus by PCR: NEGATIVE
SARS Coronavirus 2 by RT PCR: NEGATIVE

## 2024-11-06 LAB — HEPARIN LEVEL (UNFRACTIONATED): Heparin Unfractionated: >1.1 [IU]/mL — ABNORMAL HIGH (ref 0.30–0.70)

## 2024-11-06 MED ORDER — ADULT MULTIVITAMIN W/MINERALS CH
1.0000 | ORAL_TABLET | Freq: Every day | ORAL | Status: AC
  Administered 2024-11-07: 1 via ORAL
  Filled 2024-11-06: qty 1

## 2024-11-06 MED ORDER — ONDANSETRON HCL 4 MG/2ML IJ SOLN: 4.0000 mg | Freq: Four times a day (QID) | INTRAMUSCULAR | Status: AC | PRN

## 2024-11-06 MED ORDER — LORATADINE 10 MG PO TABS
10.0000 mg | ORAL_TABLET | Freq: Every day | ORAL | Status: AC
  Administered 2024-11-07: 10 mg via ORAL
  Filled 2024-11-06: qty 1

## 2024-11-06 MED ORDER — FAMOTIDINE 40 MG/5ML PO SUSR
40.0000 mg | Freq: Every day | ORAL | Status: AC
  Administered 2024-11-06 – 2024-11-07 (×2): 40 mg via ORAL
  Filled 2024-11-06 (×3): qty 5

## 2024-11-06 MED ORDER — HEPARIN (PORCINE) 25000 UT/250ML-% IV SOLN
1300.0000 [IU]/h | INTRAVENOUS | Status: AC
  Administered 2024-11-07 (×2): 1300 [IU]/h via INTRAVENOUS
  Filled 2024-11-06: qty 250

## 2024-11-06 MED ORDER — CHLORHEXIDINE GLUCONATE CLOTH 2 % EX PADS
6.0000 | MEDICATED_PAD | Freq: Every day | CUTANEOUS | Status: AC
  Administered 2024-11-07: 6 via TOPICAL

## 2024-11-06 MED ORDER — BISACODYL 10 MG RE SUPP: 10.0000 mg | Freq: Every day | RECTAL | Status: AC | PRN

## 2024-11-06 MED ORDER — ONDANSETRON HCL 4 MG PO TABS: 4.0000 mg | ORAL_TABLET | Freq: Four times a day (QID) | ORAL | Status: AC | PRN

## 2024-11-06 MED ORDER — ONE-DAILY MULTI VITAMINS PO TABS: 1.0000 | ORAL_TABLET | Freq: Every day | ORAL | Status: DC

## 2024-11-06 MED ORDER — HEPARIN BOLUS VIA INFUSION
5000.0000 [IU] | Freq: Once | INTRAVENOUS | Status: AC
  Administered 2024-11-06: 5000 [IU] via INTRAVENOUS
  Filled 2024-11-06: qty 5000

## 2024-11-06 MED ORDER — ALBUTEROL SULFATE (2.5 MG/3ML) 0.083% IN NEBU: 2.5000 mg | INHALATION_SOLUTION | RESPIRATORY_TRACT | Status: AC | PRN

## 2024-11-06 MED ORDER — POLYETHYLENE GLYCOL 3350 17 G PO PACK
17.0000 g | PACK | Freq: Every day | ORAL | Status: AC | PRN
  Administered 2024-11-07: 17 g via ORAL
  Filled 2024-11-06: qty 1

## 2024-11-06 MED ORDER — IOHEXOL 350 MG/ML SOLN
75.0000 mL | Freq: Once | INTRAVENOUS | Status: AC | PRN
  Administered 2024-11-06: 75 mL via INTRAVENOUS

## 2024-11-06 MED ORDER — HEPARIN (PORCINE) 25000 UT/250ML-% IV SOLN
1500.0000 [IU]/h | INTRAVENOUS | Status: DC
  Administered 2024-11-06: 1500 [IU]/h via INTRAVENOUS
  Filled 2024-11-06: qty 250

## 2024-11-06 MED ORDER — SENNA 8.6 MG PO TABS
1.0000 | ORAL_TABLET | Freq: Two times a day (BID) | ORAL | Status: AC
  Administered 2024-11-06 – 2024-11-07 (×3): 8.6 mg via ORAL
  Filled 2024-11-06 (×4): qty 1

## 2024-11-06 NOTE — Progress Notes (Addendum)
 PHARMACY - ANTICOAGULATION CONSULT NOTE  Pharmacy Consult for IV heparin  Indication: pulmonary embolus  Allergies[1]  Patient Measurements:    Vital Signs: Temp: 97.7 F (36.5 C) (02/05 2225) Temp Source: Oral (02/05 2225) BP: 129/80 (02/05 2200) Pulse Rate: 96 (02/05 2200)  Labs: Recent Labs    11/06/24 1058 11/06/24 2159  HGB 15.5  --   HCT 46.5  --   PLT 228  --   HEPARINUNFRC  --  >1.10*  CREATININE 0.84  --     CrCl cannot be calculated (Unknown ideal weight.).   Medical History: Past Medical History:  Diagnosis Date   Atrial fibrillation [facility])    Atrial fibrillation, chronic (HCC) 03/13/2008   Qualifier: Diagnosis of  By: Jame  MD, Maude FALCON    Diverticulitis    Esophageal dysmotility    Esophageal dysphagia 07/29/2018   Family history of diabetes insipidus    FHx: colon cancer    H. pylori infection    Hiatal hernia    History of colonic polyps    History of GI bleed 10/04/2022   Hyperlipidemia    Hypertension    IDA (iron deficiency anemia)    Obesity    Palpitation    Peptic ulcer disease    Presbyesophagus    Schatzki's ring     Medications:  (Not in a hospital admission)   Assessment: Pharmacy is requested to dose heparin  in 89 yo male diagnosed with PE.  CT of chest indicates pulmonary emboli within the right middle lobar and right lower lobe proximal lateral and posterior segmental pulmonary arteries. RV: LV ratio greater than 1, which can be seen in the setting of right heart strain. Not noted to be on any blood thinners or antiplatelet meds PTA .  No height and weight noted in ED on this visit. On ED visit on 1/21. Ht is listed as 6 feet and weight is 90.7 kg. Will dose using this information.  Today, 11/06/24 HGb 15.5, plt 228 SCr 0.84 mg/dl  1st heparin  level supra-therapeutic, RN confirmed level was drawn from opposite arm where heparin  is infusing, some hematuria noted   Goal of Therapy:  Heparin  level 0.3-0.7  units/ml Monitor platelets by anticoagulation protocol: Yes   Plan:  Hold heparin  x 1 hour, then resume at 1300 units/hr Heparin  level in 8 hours Daily CBC while on heparin  Monitor for signs and symptoms of bleeding   Leeroy Mace RPh 11/06/2024, 10:34 PM         [1]  Allergies Allergen Reactions   Omeprazole  Other (See Comments)    hallucinations   Protonix  [Pantoprazole ] Other (See Comments)    Hallucinations   Codeine Other (See Comments)    constipation   Ferrous Sulfate  Itching and Swelling

## 2024-11-06 NOTE — ED Triage Notes (Addendum)
 Pt BIB EMS for fatigue and acid reflux , was recently admitted 10/22/24 for acid reflex, pt reports since dc'd he has been having more acid reflex because he has not taken the meds d/t not knowing where they are, pt also c/o generalized fatigue. Pt is a/o x4  EMS also report pt c/o midline chest pain that resolved on its own and reports could be r.t acid reflux, pt denies at this time
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
## 2024-11-06 NOTE — Progress Notes (Addendum)
" ° ° ° °  Patient Name: Glen [name]           DOB: 1928/04/10  MRN: [id number]      Admission Date: 11/06/2024  Attending Provider: Raenelle Donalda HERO, MD  Primary Diagnosis: Pulmonary embolism (HCC)   Level of care: Progressive   OVERNIGHT EVENT  Notified by bedside RN that the patient has developed recurrent gross hematuria.  The patient was noted to have hematuria earlier today after initiation of the heparin  drip.   Review of heparin  levels shows the patient is supratherapeutic. Per pharmacy recommendations, the heparin  infusion will be held for 1 hour and then resumed at a decreased rate.  Given the recurrent hematuria, a CBC will be obtained at this time.  Post-void residual volume is 316 mL. A Foley catheter will be placed, and the patient will be monitored closely for urinary obstruction related to hematuria or possible clot formation.    Wendell Nicoson, DNP, ACNPC- [facility] [place]    "

## 2024-11-06 NOTE — Progress Notes (Signed)
 Clinical/Bedside Swallow Evaluation Patient Details  Name: Glen [name] MRN: [id number] Date of Birth: [birth year]  Today's Date: 11/06/2024 Time: SLP Start Time (ACUTE ONLY): 1649 SLP Stop Time (ACUTE ONLY): 1705 SLP Time Calculation (min) (ACUTE ONLY): 16 min  Past Medical History:  Past Medical History:  Diagnosis Date   Atrial fibrillation (HCC)    Atrial fibrillation, chronic (HCC) 03/13/2008   Qualifier: Diagnosis of  By: Jame  MD, Maude FALCON    Diverticulitis    Esophageal dysmotility    Esophageal dysphagia 07/29/2018   Family history of diabetes insipidus    FHx: colon cancer    H. pylori infection    Hiatal hernia    History of colonic polyps    History of GI bleed 10/04/2022   Hyperlipidemia    Hypertension    IDA (iron deficiency anemia)    Obesity    Palpitation    Peptic ulcer disease    Presbyesophagus    Schatzki's ring    Past Surgical History:  Past Surgical History:  Procedure Laterality Date   2-D Echocardiogram  November 2009, 2011   Anal Fistula Repair     BALLOON DILATION N/A 01/17/2022   Procedure: MERRILL HODGKIN;  Surgeon: Charlanne Groom, MD;  Location: [facility];  Service: Gastroenterology;  Laterality: N/A;   BIOPSY  07/14/2020   Procedure: BIOPSY;  Surgeon: Albertus Gordy HERO, MD;  Location: [facility] END[facility];  Service: Gastroenterology;;   BIOPSY  01/17/2022   Procedure: BIOPSY;  Surgeon: Charlanne Groom, MD;  Location: [facility];  Service: Gastroenterology;;   BIOPSY OF SKIN SUBCUTANEOUS TISSUE AND/OR MUCOUS MEMBRANE  10/22/2024   Procedure: BIOPSY, SKIN, SUBCUTANEOUS TISSUE, OR MUCOUS MEMBRANE;  Surgeon: Shila Gustav GAILS, MD;  Location: [facility];  Service: Gastroenterology;;   CARDIAC VALVE SURGERY  1996   status post balloon valvuloplasty at [facility]   CATARACT EXTRACTION  2004   COLONOSCOPY  December 2005   ESOPHAGOGASTRODUODENOSCOPY  07/14/2020   ESOPHAGOGASTRODUODENOSCOPY N/A 01/17/2022   Procedure: ESOPHAGOGASTRODUODENOSCOPY (EGD);   Surgeon: Charlanne Groom, MD;  Location: [facility];  Service: Gastroenterology;  Laterality: N/A;   ESOPHAGOGASTRODUODENOSCOPY N/A 10/31/2023   Procedure: ESOPHAGOGASTRODUODENOSCOPY (EGD);  Surgeon: Abran Norleen SAILOR, MD;  Location: [facility];  Service: Gastroenterology;  Laterality: N/A;   ESOPHAGOGASTRODUODENOSCOPY N/A 10/22/2024   Procedure: EGD (ESOPHAGOGASTRODUODENOSCOPY);  Surgeon: Nandigam, Kavitha V, MD;  Location: [facility];  Service: Gastroenterology;  Laterality: N/A;   ESOPHAGOGASTRODUODENOSCOPY (EGD) WITH PROPOFOL  N/A 07/14/2020   Procedure: ESOPHAGOGASTRODUODENOSCOPY (EGD) WITH PROPOFOL ;  Surgeon: Albertus Gordy HERO, MD;  Location: [facility];  Service: Gastroenterology;  Laterality: N/A;   ETT  1991   Negative   FOREIGN BODY REMOVAL  10/31/2023   Procedure: FOREIGN BODY REMOVAL;  Surgeon: Abran Norleen SAILOR, MD;  Location: [facility];  Service: Gastroenterology;;   FOREIGN BODY RETRIEVAL N/A 10/22/2024   Procedure: FOREIGN BODY RETRIEVAL;  Surgeon: Shila Gustav GAILS, MD;  Location: [facility];  Service: Gastroenterology;  Laterality: N/A;   HEMOSTASIS CLIP PLACEMENT  07/14/2020   HEMOSTASIS CLIP PLACEMENT   HEMOSTASIS CLIP PLACEMENT  07/14/2020   Procedure: HEMOSTASIS CLIP PLACEMENT;  Surgeon: Albertus Gordy HERO, MD;  Location: [facility];  Service: Gastroenterology;;   KNEE ARTHROSCOPY     Right   TONSILLECTOMY     HPI:  Glen [name] is a 89 y.o. male admitted with fatigue and chest pain. EGD 10/22/24 after episode of food/liquids with immediate stasis and retrograde flow removing impacted food. History of esophageal stenosis, strictures with dilations, esophageal dysmotility, Schatzki;s  ring. Per GI notes pt is to eat puree food indefinitely. Additional PMH: A-fib, HTN, HLD,  GI bleed.    Assessment / Plan / Recommendation  Clinical Impression  Pt without s/s aspiration with thin and puree. He is impulsive and advised to decrease rate. Son reports eating fast and not  alternating liquids and solids as previously recommended. Reported esophageal symptoms experienced prior to EGD to removal of impacted food have improved significantly. Pt continue puree as recommended by GI (pt/son states he has been compliant) and SLP reviewed esophageal precautions. No further ST needed. SLP Visit Diagnosis: Dysphagia, unspecified (R13.10)    Aspiration Risk  Mild aspiration risk    Diet Recommendation           Other Recommendations Oral Care Recommendations: Oral care BID     Swallow Evaluation Recommendations Recommendations: PO diet PO Diet Recommendation: Dysphagia 1 (Pureed);Thin liquids (Level 0) Liquid Administration via: Cup;Straw Medication Administration: Whole meds with liquid Supervision: Patient able to self-feed Swallowing strategies  : Slow rate;Small bites/sips;Follow solids with liquids Postural changes: Position pt fully upright for meals;Stay upright 30-60 min after meals Oral care recommendations: Oral care BID (2x/day)   Assistance Recommended at Discharge    Functional Status Assessment Patient has not had a recent decline in their functional status  Frequency and Duration            Prognosis        Swallow Study   General Date of Onset: 11/06/24 HPI: Glen [name] is a 89 y.o. male admitted with fatigue and chest pain. EGD 10/22/24 after episode of food/liquids with immediate stasis and retrograde flow removing impacted food. History of esophageal stenosis, strictures with dilations, esophageal dysmotility, Schatzki;s ring. Per GI notes pt is to eat puree food indefinitely. Additional PMH: A-fib, HTN, HLD,  GI bleed. Type of Study: Bedside Swallow Evaluation Previous Swallow Assessment:  (none) Diet Prior to this Study: Dysphagia 1 (pureed);Thin liquids (Level 0) Temperature Spikes Noted: No Respiratory Status: Room air History of Recent Intubation: No Behavior/Cognition: Alert;Cooperative;Pleasant mood Oral Cavity Assessment:  Dry Oral Care Completed by SLP: No Oral Cavity - Dentition: Poor condition;Missing dentition Vision: Functional for self-feeding Self-Feeding Abilities: Able to feed self Patient Positioning: Upright in bed Baseline Vocal Quality: Normal Volitional Cough: Strong Volitional Swallow: Able to elicit    Oral/Motor/Sensory Function Overall Oral Motor/Sensory Function: Within functional limits   Ice Chips Ice chips: Not tested   Thin Liquid Thin Liquid: Within functional limits Presentation: Straw    Nectar Thick Nectar Thick Liquid: Not tested   Honey Thick Honey Thick Liquid: Not tested   Puree Puree: Within functional limits   Solid     Solid: Not tested      Dustin Olam Bull 11/06/2024,5:29 PM

## 2024-11-06 NOTE — ED Provider Notes (Cosign Needed Addendum)
 " [place] EMERGENCY DEPARTMENT AT [facility] Provider Note   CSN: [id number] Arrival date & time: 11/06/24  1020     Patient presents with: Fatigue and Chest Pain   Glen [name] is a 89 y.o. male with PMHx HTN, HLD, IDA, PUD, Afib and recent impacted food bolus 1/21 who presents to ED concerned for GERD, subjective fever, chest pain, generalized weakness since 1/21. Patient also endorsing some mild rhionrrhea. Patient initially endorsing left sided chest pain intermittently but then states that its more of a sick feeling in his chest. Patient stating that he tried to get off of the toilet earlier this week but was too weak and had difficulties.   Patient did have a impacted food bolus which was removed and he was placed on ABX. Patient has since finished the ABX course.   Denies nausea, vomiting, diarrhea. Denies cough,  congestion.    Chest Pain      Prior to Admission medications  Medication Sig Start Date End Date Taking? Authorizing Provider  acetaminophen  (TYLENOL ) 500 MG tablet Take 1,500 mg by mouth See admin instructions. 3 tabs in AM and 3 tabs in PM   Yes [provider]  famotidine  (PEPCID ) 20 MG tablet Take 1 tablet (20 mg total) by mouth 2 (two) times daily. 10/22/24 10/22/25 Yes Simon Lavonia SAILOR, MD  furosemide  (LASIX ) 20 MG tablet Take 1 tablet by mouth twice daily 06/16/24  Yes Lonni Slain, MD  loratadine  (CLARITIN ) 10 MG tablet Take 10 mg by mouth daily.   Yes [provider]  METAMUCIL FIBER PO Take 3 tablets by mouth in the morning and at bedtime.   Yes [provider]  metoprolol  tartrate (LOPRESSOR ) 100 MG tablet Take 1 tablet by mouth twice daily 06/16/24  Yes Lonni Slain, MD  Multiple Vitamin (MULTIVITAMIN) tablet Take 1 tablet by mouth daily.   Yes [provider]  amoxicillin -clavulanate (AUGMENTIN ) 875-125 MG tablet Take 1 tablet by mouth 2 (two) times daily. Patient not taking: Reported  on 11/06/2024 10/22/24   Simon Lavonia SAILOR, MD    Allergies: Omeprazole , Protonix  [pantoprazole ], Codeine, and Ferrous sulfate     Review of Systems  Cardiovascular:  Positive for chest pain.    Updated Vital Signs BP 104/63   Pulse 80   Temp 97.6 F (36.4 C) (Oral)   Resp 17   SpO2 99%   Physical Exam Vitals and nursing note reviewed.  Constitutional:      General: He is not in acute distress.    Appearance: He is not ill-appearing or toxic-appearing.  HENT:     Head: Normocephalic and atraumatic.     Mouth/Throat:     Mouth: Mucous membranes are moist.     Pharynx: No oropharyngeal exudate or posterior oropharyngeal erythema.  Eyes:     General: No scleral icterus.       Right eye: No discharge.        Left eye: No discharge.     Conjunctiva/sclera: Conjunctivae normal.  Cardiovascular:     Rate and Rhythm: Normal rate and regular rhythm.     Pulses: Normal pulses.     Heart sounds: Normal heart sounds. No murmur heard. Pulmonary:     Effort: Pulmonary effort is normal. No respiratory distress.     Breath sounds: Normal breath sounds. No wheezing, rhonchi or rales.  Abdominal:     General: Bowel sounds are normal.     Palpations: Abdomen is soft. There is no mass.  Tenderness: There is no abdominal tenderness.  Musculoskeletal:     Right lower leg: No edema.     Left lower leg: No edema.  Skin:    General: Skin is warm and dry.     Findings: No rash.  Neurological:     General: No focal deficit present.     Mental Status: He is alert and oriented to person, place, and time. Mental status is at baseline.     Comments: GCS 15. Speech is goal oriented. No deficits appreciated to CN III-XII; symmetric eyebrow raise, no facial drooping, tongue midline. Patient has equal grip strength bilaterally with 5/5 strength against resistance in all major muscle groups bilaterally. Sensation to light touch intact. Patient moves extremities without ataxia.   Psychiatric:         Mood and Affect: Mood normal.        Behavior: Behavior normal.     (all labs ordered are listed, but only abnormal results are displayed) Labs Reviewed  BASIC METABOLIC PANEL WITH GFR - Abnormal; Notable for the following components:      Result Value   Glucose, Bld 130 (*)    All other components within normal limits  CBC - Abnormal; Notable for the following components:   MCV 101.5 (*)    All other components within normal limits  HEPATIC FUNCTION PANEL - Abnormal; Notable for the following components:   Total Protein 6.4 (*)    All other components within normal limits  CBG MONITORING, ED - Abnormal; Notable for the following components:   Glucose-Capillary 102 (*)    All other components within normal limits  TROPONIN T, HIGH SENSITIVITY - Abnormal; Notable for the following components:   Troponin T High Sensitivity 39 (*)    All other components within normal limits  RESP PANEL BY RT-PCR (RSV, FLU A&B, COVID)  RVPGX2  URINALYSIS, ROUTINE W REFLEX MICROSCOPIC  PRO BRAIN NATRIURETIC PEPTIDE  I-STAT CG4 LACTIC ACID, ED  TROPONIN T, HIGH SENSITIVITY    EKG: EKG Interpretation Date/Time:  Thursday November 06 2024 10:49:14 EST Ventricular Rate:  88 PR Interval:    QRS Duration:  92 QT Interval:  364 QTC Calculation: 441 R Axis:   13  Text Interpretation: Atrial fibrillation Low voltage, precordial leads Borderline T abnormalities, anterior leads Confirmed by Freddi Hamilton [phone]) on 11/06/2024 11:50:41 AM  Radiology: CT Angio Chest PE W/Cm &/Or Wo Cm Result Date: 11/06/2024 CLINICAL DATA:  Midline chest pain EXAM: CT ANGIOGRAPHY CHEST WITH CONTRAST TECHNIQUE: Multidetector CT imaging of the chest was performed using the standard protocol during bolus administration of intravenous contrast. Multiplanar CT image reconstructions and MIPs were obtained to evaluate the vascular anatomy. RADIATION DOSE REDUCTION: This exam was performed according to the departmental  dose-optimization program which includes automated exposure control, adjustment of the mA and/or kV according to patient size and/or use of iterative reconstruction technique. CONTRAST:  75mL OMNIPAQUE  IOHEXOL  350 MG/ML SOLN COMPARISON:  Earlier same day chest radiograph FINDINGS: Cardiovascular: The study is high quality for the evaluation of pulmonary embolism. Filling defects within the right middle lobar and right lower lobe proximal lateral and posterior segmental pulmonary arteries. Ascending thoracic aortic aneurysm measures 4.4 x 4.3 cm. RV: LV ratio greater than 1. biatrial enlargement. No significant pericardial fluid/thickening. Reflux of contrast material into the hepatic veins, suggesting a degree of right heart dysfunction. Coronary artery calcifications and aortic atherosclerosis. Mediastinum/Nodes: Imaged thyroid  gland without nodules meeting criteria for imaging follow-up by size. Patulous esophagus. No  pathologically enlarged axillary, supraclavicular, mediastinal, or hilar lymph nodes. Lungs/Pleura: The central airways are patent. Mild diffuse bronchial wall thickening. No focal consolidation. Peripheral right upper lobe 5 x 4 mm nodule (12:52). No specific follow-up imaging recommended. No pneumothorax. No pleural effusion. Upper abdomen: Normal. Musculoskeletal: No acute or abnormal lytic or blastic osseous lesions. Flowing anterior osteophytes over at least 4 contiguous thoracic vertebrae which can be seen in the setting of diffuse idiopathic skeletal hyperostosis (DISH). Review of the MIP images confirms the above findings. IMPRESSION: 1. Pulmonary emboli within the right middle lobar and right lower lobe proximal lateral and posterior segmental pulmonary arteries. 2. RV: LV ratio greater than 1, which can be seen in the setting of right heart strain. 3. Ascending thoracic aortic aneurysm measures 4.4 x 4.3 cm. Consider annual imaging followup by CTA or MRA, if this recommendation aligns with  the patient's goals of care. 4.  Aortic Atherosclerosis (ICD10-I70.0). Critical Value/emergent results were called by telephone at the time of interpretation on 11/06/2024 at 12:46 pm to Glendia Breeding, MD, who verbally acknowledged these results. Electronically Signed   By: Limin  Xu M.D.   On: 11/06/2024 12:49   DG Chest 2 View Result Date: 11/06/2024 EXAM: 2 VIEW(S) XRAY OF THE CHEST 11/06/2024 11:42:04 AM COMPARISON: None available. CLINICAL HISTORY: Chest pain. FINDINGS: LUNGS AND PLEURA: Low lung volumes. No focal pulmonary opacity. No pleural effusion. No pneumothorax. HEART AND MEDIASTINUM: Cardiomegaly. Unchanged prominence of the right paratracheal stripe, likely autofactual due to low lung volumes and patient rotation. Aortic atherosclerosis. BONES AND SOFT TISSUES: Thoracic degenerative changes. IMPRESSION: 1. Low lung volumes. No acute cardiopulmonary abnormality. Electronically signed by: Rogelia Myers MD 11/06/2024 11:54 AM EST RP Workstation: [id number]     .Critical Care  Performed by: Hoy Nidia FALCON, PA-C Authorized by: Hoy Nidia FALCON, PA-C   Critical care provider statement:    Critical care time (minutes):  30   Critical care was necessary to treat or prevent imminent or life-threatening deterioration of the following conditions: PE.   Critical care was time spent personally by me on the following activities:  Development of treatment plan with patient or surrogate, discussions with consultants, evaluation of patient's response to treatment, examination of patient, ordering and review of laboratory studies, ordering and review of radiographic studies, ordering and performing treatments and interventions, pulse oximetry, re-evaluation of patient's condition and review of old charts   Care discussed with: admitting provider   Comments:     PE    Medications Ordered in the ED  iohexol  (OMNIPAQUE ) 350 MG/ML injection 75 mL (75 mLs Intravenous Contrast Given 11/06/24 1216)                                     Medical Decision Making Amount and/or Complexity of Data Reviewed Labs: ordered. Radiology: ordered.  Risk Prescription drug management. Decision regarding hospitalization.   This patient presents to the ED for concern of weakness, this involves an extensive number of treatment options, and is a complaint that carries with it a high risk of complications and morbidity.  The differential diagnosis includes Ischemic stroke, intracerebral hemorrhage, subarachnoid hemorrhage, Guillain-Barr syndrome, hypoglycemia, electrolyte abnormality, sepsis, ACS, carbon monoxide poisoning, anemia, dehydration.   Co morbidities that complicate the patient evaluation  HTN, HLD, IDA, PUD, Afib   Additional history obtained:  Additional history obtained from 1/21 ED note   Problem List / ED Course /  Critical interventions / Medication management  Patient presents to ED concerned for fever, chest pain, SOB, weakness over the past 2 weeks. Symptoms started after having impacted food bolus removed on 1/21. Physical/neuro exam reassuring. Patient afebrile with stable vitals.  I Ordered, and personally interpreted labs. CBC without leukocytosis or anemia. BMP with mild hyperglycemia at 130. HFP reassuring. Initial troponin 39. Resp panel negative. Repeat trop, BNP, and UA pending a this time. The patient was maintained on a cardiac monitor.  I personally viewed and interpreted the EKG/cardiac monitored which showed an underlying rhythm of: afib I ordered imaging studies including chest xray and CTA chest . I independently visualized and interpreted imaging which showed PE with evidence of right heart strain . I agree with the radiologist interpretation Heparin  per pharmacy consult placed. Dr. Raenelle admitting provider. Staffed with Dr. Freddi who was able to assist with workup today. I have reviewed the patients home medicines and have made adjustments as  needed   Social Determinants of Health:  geriatric      Final diagnoses:  Multiple subsegmental pulmonary emboli without acute cor pulmonale [facility])    ED Discharge Orders     None          Hoy Nidia FALCON, [place] 11/06/24 1321  "

## 2024-11-06 NOTE — Progress Notes (Signed)
 PHARMACY - ANTICOAGULATION CONSULT NOTE  Pharmacy Consult for IV heparin  Indication: pulmonary embolus  Allergies[1]  Patient Measurements:    Vital Signs: Temp: 97.6 F (36.4 C) (02/05 1040) Temp Source: Oral (02/05 1040) BP: 104/63 (02/05 1040) Pulse Rate: 80 (02/05 1040)  Labs: Recent Labs    11/06/24 1058  HGB 15.5  HCT 46.5  PLT 228  CREATININE 0.84    CrCl cannot be calculated (Unknown ideal weight.).   Medical History: Past Medical History:  Diagnosis Date   Atrial fibrillation [facility])    Atrial fibrillation, chronic (HCC) 03/13/2008   Qualifier: Diagnosis of  By: Jame  MD, Maude FALCON    Diverticulitis    Esophageal dysmotility    Esophageal dysphagia 07/29/2018   Family history of diabetes insipidus    FHx: colon cancer    H. pylori infection    Hiatal hernia    History of colonic polyps    History of GI bleed 10/04/2022   Hyperlipidemia    Hypertension    IDA (iron deficiency anemia)    Obesity    Palpitation    Peptic ulcer disease    Presbyesophagus    Schatzki's ring     Medications:  (Not in a hospital admission)   Assessment: Pharmacy is requested to dose heparin  in 89 yo male diagnosed with PE.  CT of chest indicates pulmonary emboli within the right middle lobar and right lower lobe proximal lateral and posterior segmental pulmonary arteries. RV: LV ratio greater than 1, which can be seen in the setting of right heart strain. Not noted to be on any blood thinners or antiplatelet meds PTA .  No height and weight noted in ED on this visit. On ED visit on 1/21. Ht is listed as 6 feet and weight is 90.7 kg. Will dose using this information.  Today, 11/06/24 HGb 15.5, plt 228 SCr 0.84 mg/dl   Goal of Therapy:  Heparin  level 0.3-0.7 units/ml Monitor platelets by anticoagulation protocol: Yes   Plan:  Heparin  5000 unit bolus followed by heparin  1500 units/hr Daily CBC while on heparin  Obtain HL 8 hours after start of  infusion Monitor for signs and symptoms of bleeding   Dolphus Roller, PharmD, BCPS 11/06/2024 1:30 PM        [1]  Allergies Allergen Reactions   Omeprazole  Other (See Comments)    hallucinations   Protonix  [Pantoprazole ] Other (See Comments)    Hallucinations   Codeine Other (See Comments)    constipation   Ferrous Sulfate  Itching and Swelling

## 2024-11-07 ENCOUNTER — Inpatient Hospital Stay (HOSPITAL_COMMUNITY)

## 2024-11-07 ENCOUNTER — Encounter (HOSPITAL_COMMUNITY): Payer: Self-pay | Admitting: Internal Medicine

## 2024-11-07 ENCOUNTER — Other Ambulatory Visit (HOSPITAL_COMMUNITY): Payer: Self-pay

## 2024-11-07 DIAGNOSIS — M7989 Other specified soft tissue disorders: Secondary | ICD-10-CM

## 2024-11-07 LAB — CBC
HCT: 45.5 % (ref 39.0–52.0)
Hemoglobin: 15 g/dL (ref 13.0–17.0)
MCH: 34.2 pg — ABNORMAL HIGH (ref 26.0–34.0)
MCHC: 33 g/dL (ref 30.0–36.0)
MCV: 103.9 fL — ABNORMAL HIGH (ref 80.0–100.0)
Platelets: 186 10*3/uL (ref 150–400)
RBC: 4.38 MIL/uL (ref 4.22–5.81)
RDW: 12.5 % (ref 11.5–15.5)
WBC: 7.8 10*3/uL (ref 4.0–10.5)
nRBC: 0 % (ref 0.0–0.2)

## 2024-11-07 LAB — COMPREHENSIVE METABOLIC PANEL WITH GFR
ALT: 12 U/L (ref 0–44)
AST: 23 U/L (ref 15–41)
Albumin: 3.7 g/dL (ref 3.5–5.0)
Alkaline Phosphatase: 81 U/L (ref 38–126)
Anion gap: 10 (ref 5–15)
BUN: 15 mg/dL (ref 8–23)
CO2: 26 mmol/L (ref 22–32)
Calcium: 9.2 mg/dL (ref 8.9–10.3)
Chloride: 105 mmol/L (ref 98–111)
Creatinine, Ser: 0.76 mg/dL (ref 0.61–1.24)
GFR, Estimated: >60 mL/min
Glucose, Bld: 92 mg/dL (ref 70–99)
Potassium: 3.7 mmol/L (ref 3.5–5.1)
Sodium: 140 mmol/L (ref 135–145)
Total Bilirubin: 0.6 mg/dL (ref 0.0–1.2)
Total Protein: 6 g/dL — ABNORMAL LOW (ref 6.5–8.1)

## 2024-11-07 LAB — ECHOCARDIOGRAM COMPLETE
Area-P 1/2: 3.49 cm2
S' Lateral: 2.4 cm

## 2024-11-07 LAB — HEPARIN LEVEL (UNFRACTIONATED)
Heparin Unfractionated: 0.73 [IU]/mL — ABNORMAL HIGH (ref 0.30–0.70)
Heparin Unfractionated: 0.91 [IU]/mL — ABNORMAL HIGH (ref 0.30–0.70)

## 2024-11-07 MED ORDER — ACETAMINOPHEN 325 MG PO TABS
650.0000 mg | ORAL_TABLET | Freq: Four times a day (QID) | ORAL | Status: AC | PRN
  Administered 2024-11-07 (×2): 650 mg via ORAL
  Filled 2024-11-07 (×2): qty 2

## 2024-11-07 MED ORDER — METOPROLOL TARTRATE 50 MG PO TABS
50.0000 mg | ORAL_TABLET | Freq: Two times a day (BID) | ORAL | Status: AC
  Administered 2024-11-07 (×2): 50 mg via ORAL
  Filled 2024-11-07 (×2): qty 1

## 2024-11-07 MED ORDER — MELATONIN 5 MG PO TABS
5.0000 mg | ORAL_TABLET | Freq: Every day | ORAL | Status: AC
  Administered 2024-11-07: 5 mg via ORAL
  Filled 2024-11-07: qty 1

## 2024-11-07 NOTE — ED Notes (Signed)
 Hematuria unresolved red tinged urine noted

## 2024-11-07 NOTE — Progress Notes (Signed)
 " PROGRESS NOTE    Glen [name]  FMW:[id number] DOB: 1927-11-16 DOA: 11/06/2024 PCP: Caro Harlene POUR, NP   Brief Narrative:  89 y.o. male with medical history significant of A-fib, HTN, HLD, prior history of GI bleed in 2021 and recent food impaction on 10/22/2024 requiring urgent EGD (found to have esophageal stenosis as well) with GI recommending pured diet indefinitely.  He presented with fatigue and chest pain.  On presentation, CTA chest showed PE with RV strain.  He was started on IV heparin .  Assessment & Plan:   Acute pulmonary embolism -Imaging as above.  Patient is currently hemodynamically stable and on room air although imaging showed possible RV strain.  Continue heparin  drip.  Follow lower extremity duplex ultrasound.  Possible switch to oral Eliquis  tomorrow if remains stable  History of Schatzki's ring/history of food impactions most recently on 10/22/2024 requiring EGD History of esophageal stricture/stenosis - Continue pured diet.  SLP evaluation appreciated.  Outpatient follow-up with GI  Permanent A-fib -Currently tachycardic.  Resume beta-blocker.  Anticoagulation plan as above  Hypertension - Resume beta-blocker.  Diuretics on hold for now  Chronic lower extremity edema -History of chronic venous insufficiency as per cardiology notes.  Resume Lasix  once blood pressure allows  History of GI bleeding in 2021 - Per outpatient GI note: This was related to H. pylori and there was a bleeding duodenal ulcer.  Patient is allergic to PPI: Hence we will continue H2 blocker while he is on anticoagulation  Physical deconditioning - PT eval  Urinary retention Hematuria -Foley catheter was placed overnight.  Currently has intermittent blood-tinged urine.  Continue heparin  drip.  Monitor.  Might need outpatient urology evaluation.  Start Flomax  if blood pressure allows.  DVT prophylaxis: Heparin  drip Code Status: DNR Family Communication: Son Jimmy at  bedside Disposition Plan: Status is: Inpatient Remains inpatient appropriate because: Of severity of illness    Consultants: None  Procedures: None  Antimicrobials: None   Subjective: Patient seen and examined at bedside.  Foley catheter was placed overnight as per nursing staff because of hematuria and urinary retention.  No fever, vomiting, worsening shortness of breath reported.  Objective: Vitals:   11/07/24 0600 11/07/24 0730 11/07/24 0846 11/07/24 1032  BP: 117/76 129/89  127/88  Pulse: (!) 105 (!) 109  (!) 113  Resp: 15 (!) 23  (!) 23  Temp:   97.8 F (36.6 C) (!) 97.5 F (36.4 C)  TempSrc:   Oral   SpO2: 96% 98%  97%    Intake/Output Summary (Last 24 hours) at 11/07/2024 1102 Last data filed at 11/07/2024 0334 Gross per 24 hour  Intake 174.08 ml  Output 550 ml  Net -375.92 ml   There were no vitals filed for this visit.  Examination:  General exam: Appears calm and comfortable.  Elderly male lying in bed. Respiratory system: Bilateral decreased breath sounds at bases Cardiovascular system: S1 & S2 heard, tachycardic Gastrointestinal system: Abdomen is nondistended, soft and nontender. Normal bowel sounds heard. Extremities: No cyanosis, clubbing, edema  Central nervous system: Alert and oriented. No focal neurological deficits. Moving extremities Skin: No rashes, lesions or ulcers Psychiatry: Flat affect.  Not agitated Genitourinary: Foley catheter present    Data Reviewed: I have personally reviewed following labs and imaging studies  CBC: Recent Labs  Lab 11/06/24 1058 11/06/24 2346  WBC 9.2 7.8  HGB 15.5 15.0  HCT 46.5 45.5  MCV 101.5* 103.9*  PLT 228 186   Basic Metabolic Panel: Recent Labs  Lab 11/06/24 1058 11/07/24 0309  NA 139 140  K 4.5 3.7  CL 105 105  CO2 24 26  GLUCOSE 130* 92  BUN 22 15  CREATININE 0.84 0.76  CALCIUM 9.3 9.2   GFR: CrCl cannot be calculated (Unknown ideal weight.). Liver Function Tests: Recent Labs   Lab 11/06/24 1058 11/07/24 0309  AST 26 23  ALT 17 12  ALKPHOS 91 81  BILITOT 0.5 0.6  PROT 6.4* 6.0*  ALBUMIN 3.8 3.7   No results for input(s): LIPASE, AMYLASE in the last 168 hours. No results for input(s): AMMONIA in the last 168 hours. Coagulation Profile: No results for input(s): INR, PROTIME in the last 168 hours. Cardiac Enzymes: No results for input(s): CKTOTAL, CKMB, CKMBINDEX, TROPONINI in the last 168 hours. BNP (last 3 results) Recent Labs    11/06/24 1328  PROBNP 884.0*   HbA1C: No results for input(s): HGBA1C in the last 72 hours. CBG: Recent Labs  Lab 11/06/24 1148  GLUCAP 102*   Lipid Profile: No results for input(s): CHOL, HDL, LDLCALC, TRIG, CHOLHDL, LDLDIRECT in the last 72 hours. Thyroid  Function Tests: No results for input(s): TSH, T4TOTAL, FREET4, T3FREE, THYROIDAB in the last 72 hours. Anemia Panel: No results for input(s): VITAMINB12, FOLATE, FERRITIN, TIBC, IRON, RETICCTPCT in the last 72 hours. Sepsis Labs: Recent Labs  Lab 11/06/24 1359  LATICACIDVEN 1.8    Recent Results (from the past 240 hours)  Resp panel by RT-PCR (RSV, Flu A&B, Covid) Anterior Nasal Swab     Status: None   Collection Time: 11/06/24 11:45 AM   Specimen: Anterior Nasal Swab  Result Value Ref Range Status   SARS Coronavirus 2 by RT PCR NEGATIVE NEGATIVE Final    Comment: (NOTE) SARS-CoV-2 target nucleic acids are NOT DETECTED.  The SARS-CoV-2 RNA is generally detectable in upper respiratory specimens during the acute phase of infection. The lowest concentration of SARS-CoV-2 viral copies this assay can detect is 138 copies/mL. A negative result does not preclude SARS-Cov-2 infection and should not be used as the sole basis for treatment or other patient management decisions. A negative result may occur with  improper specimen collection/handling, submission of specimen other than nasopharyngeal swab,  presence of viral mutation(s) within the areas targeted by this assay, and inadequate number of viral copies(<138 copies/mL). A negative result must be combined with clinical observations, patient history, and epidemiological information. The expected result is Negative.  Fact Sheet for Patients:  bloggercourse.com  Fact Sheet for Healthcare Providers:  seriousbroker.it  This test is no t yet approved or cleared by the [place]  FDA and  has been authorized for detection and/or diagnosis of SARS-CoV-2 by FDA under an Emergency Use Authorization (EUA). This EUA will remain  in effect (meaning this test can be used) for the duration of the COVID-19 declaration under Section 564(b)(1) of the Act, 21 U.S.C.section 360bbb-3(b)(1), unless the authorization is terminated  or revoked sooner.       Influenza A by PCR NEGATIVE NEGATIVE Final   Influenza B by PCR NEGATIVE NEGATIVE Final    Comment: (NOTE) The Xpert Xpress SARS-CoV-2/FLU/RSV plus assay is intended as an aid in the diagnosis of influenza from Nasopharyngeal swab specimens and should not be used as a sole basis for treatment. Nasal washings and aspirates are unacceptable for Xpert Xpress SARS-CoV-2/FLU/RSV testing.  Fact Sheet for Patients: bloggercourse.com  Fact Sheet for Healthcare Providers: seriousbroker.it  This test is not yet approved or cleared by the [place]  FDA and has  been authorized for detection and/or diagnosis of SARS-CoV-2 by FDA under an Emergency Use Authorization (EUA). This EUA will remain in effect (meaning this test can be used) for the duration of the COVID-19 declaration under Section 564(b)(1) of the Act, 21 U.S.C. section 360bbb-3(b)(1), unless the authorization is terminated or revoked.     Resp Syncytial Virus by PCR NEGATIVE NEGATIVE Final    Comment: (NOTE) Fact Sheet for  Patients: bloggercourse.com  Fact Sheet for Healthcare Providers: seriousbroker.it  This test is not yet approved or cleared by the [place]  FDA and has been authorized for detection and/or diagnosis of SARS-CoV-2 by FDA under an Emergency Use Authorization (EUA). This EUA will remain in effect (meaning this test can be used) for the duration of the COVID-19 declaration under Section 564(b)(1) of the Act, 21 U.S.C. section 360bbb-3(b)(1), unless the authorization is terminated or revoked.  Performed at [facility], [address], [place], [place] [address]          Radiology Studies: CT Angio Chest PE W/Cm &/Or Wo Cm Result Date: 11/06/2024 CLINICAL DATA:  Midline chest pain EXAM: CT ANGIOGRAPHY CHEST WITH CONTRAST TECHNIQUE: Multidetector CT imaging of the chest was performed using the standard protocol during bolus administration of intravenous contrast. Multiplanar CT image reconstructions and MIPs were obtained to evaluate the vascular anatomy. RADIATION DOSE REDUCTION: This exam was performed according to the departmental dose-optimization program which includes automated exposure control, adjustment of the mA and/or kV according to patient size and/or use of iterative reconstruction technique. CONTRAST:  75mL OMNIPAQUE  IOHEXOL  350 MG/ML SOLN COMPARISON:  Earlier same day chest radiograph FINDINGS: Cardiovascular: The study is high quality for the evaluation of pulmonary embolism. Filling defects within the right middle lobar and right lower lobe proximal lateral and posterior segmental pulmonary arteries. Ascending thoracic aortic aneurysm measures 4.4 x 4.3 cm. RV: LV ratio greater than 1. biatrial enlargement. No significant pericardial fluid/thickening. Reflux of contrast material into the hepatic veins, suggesting a degree of right heart dysfunction. Coronary artery calcifications and aortic atherosclerosis.  Mediastinum/Nodes: Imaged thyroid  gland without nodules meeting criteria for imaging follow-up by size. Patulous esophagus. No pathologically enlarged axillary, supraclavicular, mediastinal, or hilar lymph nodes. Lungs/Pleura: The central airways are patent. Mild diffuse bronchial wall thickening. No focal consolidation. Peripheral right upper lobe 5 x 4 mm nodule (12:52). No specific follow-up imaging recommended. No pneumothorax. No pleural effusion. Upper abdomen: Normal. Musculoskeletal: No acute or abnormal lytic or blastic osseous lesions. Flowing anterior osteophytes over at least 4 contiguous thoracic vertebrae which can be seen in the setting of diffuse idiopathic skeletal hyperostosis (DISH). Review of the MIP images confirms the above findings. IMPRESSION: 1. Pulmonary emboli within the right middle lobar and right lower lobe proximal lateral and posterior segmental pulmonary arteries. 2. RV: LV ratio greater than 1, which can be seen in the setting of right heart strain. 3. Ascending thoracic aortic aneurysm measures 4.4 x 4.3 cm. Consider annual imaging followup by CTA or MRA, if this recommendation aligns with the patient's goals of care. 4.  Aortic Atherosclerosis (ICD10-I70.0). Critical Value/emergent results were called by telephone at the time of interpretation on 11/06/2024 at 12:46 pm to Glendia Breeding, MD, who verbally acknowledged these results. Electronically Signed   By: Limin  Xu M.D.   On: 11/06/2024 12:49   DG Chest 2 View Result Date: 11/06/2024 EXAM: 2 VIEW(S) XRAY OF THE CHEST 11/06/2024 11:42:04 AM COMPARISON: None available. CLINICAL HISTORY: Chest pain. FINDINGS: LUNGS AND PLEURA: Low lung  volumes. No focal pulmonary opacity. No pleural effusion. No pneumothorax. HEART AND MEDIASTINUM: Cardiomegaly. Unchanged prominence of the right paratracheal stripe, likely autofactual due to low lung volumes and patient rotation. Aortic atherosclerosis. BONES AND SOFT TISSUES: Thoracic  degenerative changes. IMPRESSION: 1. Low lung volumes. No acute cardiopulmonary abnormality. Electronically signed by: Rogelia Myers MD 11/06/2024 11:54 AM EST RP Workstation: [id number]        Scheduled Meds:  Chlorhexidine  Gluconate Cloth  6 each Topical Daily   famotidine   40 mg Oral Daily   loratadine   10 mg Oral Daily   multivitamin with minerals  1 tablet Oral Daily   senna  1 tablet Oral BID   Continuous Infusions:  heparin  1,200 Units/hr (11/07/24 1029)          Sophie Mao, MD [facility] 11/07/2024, 11:02 AM   "

## 2024-11-07 NOTE — Progress Notes (Signed)
 VASCULAR LAB    Bilateral lower extremity venous duplex has been performed.  See CV proc for preliminary results.   Jayle Solarz, RVT 11/07/2024, 10:21 AM

## 2024-11-07 NOTE — ED Notes (Addendum)
 Pt called nurse to room c/o of hematuria. Consulted A. Andrez, NP, who asked about retention. Post void residual showed . Foley orders placed w/ the suggestion of 68fr for blood clots. Foley placed and education done. CBC orders placed and collected.

## 2024-11-07 NOTE — Progress Notes (Signed)
 Pharmacy Brief Note - Evening Anticoagulation Follow Up:  Pt is a 63 yoM currently on heparin  drip for PE, DVT. For full history, see note by Marget Hench, PharmD from earlier today.   Assessment: Heparin  level = 0.91 is supratherapeutic on heparin  infusion of 1200 units/hr Noted hematuria reported this morning, spoke with RN who reports hematuria is improving - urine lighter in color.   Goal: Heparin  level 0.3 - 0.7  Plan: Decrease the rate of heparin  infusion to 1000 units/hr Check heparin  level in 8 hours CBC, heparin  level daily Monitor for signs of bleeding, worsening hematuria  Ronal CHRISTELLA Rav, PharmD 11/07/24 2:13 PM

## 2024-11-07 NOTE — ED Notes (Signed)
 Heparin  level drawn and pharmacy consulted. Heparin  levels too high. Pharmacy informed nurse to stop heparin  for an hour and restart at 1300 units/hr.

## 2024-11-07 NOTE — Progress Notes (Signed)
 OT Cancellation Note  Patient Details Name: Glen [name] MRN: [id number] DOB: Aug 29, 1928   Cancelled Treatment:    Reason Eval/Treat Not Completed: Medical issues which prohibited therapy  Patient on Heparin  <24 hrs (initiated 1406 11/06/24), OT will await evaluation until later this day or as soon as schedule allows as per therapy guidelines.   Francile Woolford OT/L Acute Rehabilitation Department  [phone]    11/07/2024, 7:58 AM

## 2024-11-07 NOTE — Progress Notes (Signed)
" °  Echocardiogram 2D Echocardiogram has been performed.  Tinnie FORBES Gosling RDCS 11/07/2024, 11:05 AM "

## 2024-11-07 NOTE — Progress Notes (Addendum)
 PHARMACY - ANTICOAGULATION CONSULT NOTE  Pharmacy Consult for IV heparin  Indication: pulmonary embolus, L DVT  Allergies[1]  Patient Measurements:    Vital Signs: Temp: 97.8 F (36.6 C) (02/06 0846) Temp Source: Oral (02/06 0846) BP: 129/89 (02/06 0730) Pulse Rate: 109 (02/06 0730)  Labs: Recent Labs    11/06/24 1058 11/06/24 2159 11/06/24 2346 11/07/24 0309 11/07/24 0837  HGB 15.5  --  15.0  --   --   HCT 46.5  --  45.5  --   --   PLT 228  --  186  --   --   HEPARINUNFRC  --  >1.10*  --   --  0.73*  CREATININE 0.84  --   --  0.76  --     CrCl cannot be calculated (Unknown ideal weight.).   Medical History: Past Medical History:  Diagnosis Date   Atrial fibrillation [facility])    Atrial fibrillation, chronic (HCC) 03/13/2008   Qualifier: Diagnosis of  By: Jame  MD, Maude FALCON    Diverticulitis    Esophageal dysmotility    Esophageal dysphagia 07/29/2018   Family history of diabetes insipidus    FHx: colon cancer    H. pylori infection    Hiatal hernia    History of colonic polyps    History of GI bleed 10/04/2022   Hyperlipidemia    Hypertension    IDA (iron deficiency anemia)    Obesity    Palpitation    Peptic ulcer disease    Presbyesophagus    Schatzki's ring     Medications:  (Not in a hospital admission)   Assessment: Pharmacy is requested to dose heparin  in 89 yo male diagnosed with PE.  CT of chest indicates pulmonary emboli within the right middle lobar and right lower lobe proximal lateral and posterior segmental pulmonary arteries. RV: LV ratio greater than 1, which can be seen in the setting of right heart strain. L DVT also found on imaging. Not noted to be on any blood thinners or antiplatelet meds PTA .  No height and weight noted in ED on this visit. On ED visit on 10/22/24. Height is listed as 6 feet and weight is 90.7 kg. Will dose using this information.  Today, 11/07/24 Heparin  level 0.73, slightly supratherpeutic on infusion  running at 1300 units/hr Hgb stable, PLTc down but WNL SCr 0.76 mg/dl Per RN early report this AM, hematuria noted. Foley cath subsequently placed. Still has red tinged urine this morning. No infusion complications noted.     Goal of Therapy:  Heparin  level 0.3-0.7 units/ml Monitor platelets by anticoagulation protocol: Yes   Plan:  Slightly reduce heparin  rate to 1200 units/hr Check heparin  level in 8 hours after rate change Daily CBC while on heparin  Monitor for signs and symptoms of bleeding   Thank you for allowing pharmacy to be a part of this patients care.  Marget Hench, PharmD Clinical Pharmacist 11/07/2024 9:30 AM             [1]  Allergies Allergen Reactions   Omeprazole  Other (See Comments)    hallucinations   Protonix  [Pantoprazole ] Other (See Comments)    Hallucinations   Codeine Other (See Comments)    constipation   Ferrous Sulfate  Itching and Swelling

## 2024-11-11 ENCOUNTER — Ambulatory Visit: Admitting: Gastroenterology

## 2024-12-01 ENCOUNTER — Ambulatory Visit (HOSPITAL_BASED_OUTPATIENT_CLINIC_OR_DEPARTMENT_OTHER): Admitting: Cardiology

## 2024-12-03 ENCOUNTER — Ambulatory Visit: Admitting: Gastroenterology

## 2024-12-29 ENCOUNTER — Ambulatory Visit: Payer: Self-pay | Admitting: Nurse Practitioner
